# Patient Record
Sex: Female | Born: 1945
Health system: Southern US, Community
[De-identification: ages and names within clinical notes are randomized; demographics above are authoritative.]

## PROBLEM LIST (undated history)

## (undated) DIAGNOSIS — K5900 Constipation, unspecified: Secondary | ICD-10-CM

## (undated) DIAGNOSIS — T7840XA Allergy, unspecified, initial encounter: Secondary | ICD-10-CM

## (undated) DIAGNOSIS — G4700 Insomnia, unspecified: Secondary | ICD-10-CM

## (undated) DIAGNOSIS — G4730 Sleep apnea, unspecified: Secondary | ICD-10-CM

## (undated) DIAGNOSIS — M1990 Unspecified osteoarthritis, unspecified site: Secondary | ICD-10-CM

## (undated) DIAGNOSIS — Z8739 Personal history of other diseases of the musculoskeletal system and connective tissue: Secondary | ICD-10-CM

## (undated) DIAGNOSIS — M810 Age-related osteoporosis without current pathological fracture: Secondary | ICD-10-CM

## (undated) DIAGNOSIS — D649 Anemia, unspecified: Secondary | ICD-10-CM

## (undated) DIAGNOSIS — F419 Anxiety disorder, unspecified: Secondary | ICD-10-CM

## (undated) DIAGNOSIS — K219 Gastro-esophageal reflux disease without esophagitis: Secondary | ICD-10-CM

## (undated) DIAGNOSIS — E039 Hypothyroidism, unspecified: Secondary | ICD-10-CM

## (undated) DIAGNOSIS — R569 Unspecified convulsions: Secondary | ICD-10-CM

## (undated) DIAGNOSIS — M545 Low back pain: Secondary | ICD-10-CM

## (undated) DIAGNOSIS — F329 Major depressive disorder, single episode, unspecified: Secondary | ICD-10-CM

## (undated) DIAGNOSIS — J189 Pneumonia, unspecified organism: Secondary | ICD-10-CM

## (undated) DIAGNOSIS — K5790 Diverticulosis of intestine, part unspecified, without perforation or abscess without bleeding: Secondary | ICD-10-CM

## (undated) DIAGNOSIS — E785 Hyperlipidemia, unspecified: Secondary | ICD-10-CM

## (undated) DIAGNOSIS — R519 Headache, unspecified: Secondary | ICD-10-CM

## (undated) DIAGNOSIS — C801 Malignant (primary) neoplasm, unspecified: Secondary | ICD-10-CM

## (undated) DIAGNOSIS — R200 Anesthesia of skin: Secondary | ICD-10-CM

## (undated) DIAGNOSIS — Z8669 Personal history of other diseases of the nervous system and sense organs: Secondary | ICD-10-CM

## (undated) DIAGNOSIS — I10 Essential (primary) hypertension: Secondary | ICD-10-CM

## (undated) DIAGNOSIS — H4450 Unspecified degenerated conditions of globe: Secondary | ICD-10-CM

## (undated) DIAGNOSIS — R011 Cardiac murmur, unspecified: Secondary | ICD-10-CM

## (undated) HISTORY — PX: OTHER SURGICAL HISTORY: SHX169

## (undated) HISTORY — DX: Essential (primary) hypertension: I10

## (undated) HISTORY — DX: Low back pain: M54.5

## (undated) HISTORY — DX: Unspecified degenerated conditions of globe: H44.50

## (undated) HISTORY — DX: Major depressive disorder, single episode, unspecified: F32.9

## (undated) HISTORY — PX: ADENOIDECTOMY: SUR15

## (undated) HISTORY — DX: Gastro-esophageal reflux disease without esophagitis: K21.9

## (undated) HISTORY — DX: Allergy, unspecified, initial encounter: T78.40XA

## (undated) HISTORY — DX: Sleep apnea, unspecified: G47.30

## (undated) HISTORY — PX: BACK SURGERY: SHX140

## (undated) HISTORY — PX: ESOPHAGOGASTRODUODENOSCOPY: SHX1529

## (undated) HISTORY — DX: Pneumonia, unspecified organism: J18.9

## (undated) HISTORY — DX: Anxiety disorder, unspecified: F41.9

## (undated) HISTORY — PX: UPPER GASTROINTESTINAL ENDOSCOPY: SHX188

## (undated) HISTORY — DX: Age-related osteoporosis without current pathological fracture: M81.0

## (undated) HISTORY — PX: TONSILLECTOMY: SUR1361

---

## 1997-10-30 ENCOUNTER — Ambulatory Visit (HOSPITAL_COMMUNITY): Admission: RE | Admit: 1997-10-30 | Discharge: 1997-10-30 | Payer: Self-pay | Admitting: Gynecology

## 1998-04-22 ENCOUNTER — Emergency Department (HOSPITAL_COMMUNITY): Admission: EM | Admit: 1998-04-22 | Discharge: 1998-04-22 | Payer: Self-pay

## 1998-12-05 ENCOUNTER — Encounter: Payer: Self-pay | Admitting: Gynecology

## 1998-12-05 ENCOUNTER — Ambulatory Visit (HOSPITAL_COMMUNITY): Admission: RE | Admit: 1998-12-05 | Discharge: 1998-12-05 | Payer: Self-pay | Admitting: Gynecology

## 1999-05-14 ENCOUNTER — Other Ambulatory Visit: Admission: RE | Admit: 1999-05-14 | Discharge: 1999-05-14 | Payer: Self-pay | Admitting: Gynecology

## 1999-10-09 ENCOUNTER — Other Ambulatory Visit: Admission: RE | Admit: 1999-10-09 | Discharge: 1999-10-09 | Payer: Self-pay | Admitting: Gynecology

## 1999-10-09 ENCOUNTER — Encounter (INDEPENDENT_AMBULATORY_CARE_PROVIDER_SITE_OTHER): Payer: Self-pay

## 2000-02-25 ENCOUNTER — Encounter: Payer: Self-pay | Admitting: Gynecology

## 2000-02-25 ENCOUNTER — Ambulatory Visit (HOSPITAL_COMMUNITY): Admission: RE | Admit: 2000-02-25 | Discharge: 2000-02-25 | Payer: Self-pay | Admitting: Gynecology

## 2000-03-02 ENCOUNTER — Encounter: Admission: RE | Admit: 2000-03-02 | Discharge: 2000-03-02 | Payer: Self-pay | Admitting: Gynecology

## 2000-03-02 ENCOUNTER — Encounter: Payer: Self-pay | Admitting: Gynecology

## 2000-06-29 ENCOUNTER — Other Ambulatory Visit: Admission: RE | Admit: 2000-06-29 | Discharge: 2000-06-29 | Payer: Self-pay | Admitting: Gynecology

## 2000-09-17 ENCOUNTER — Encounter: Payer: Self-pay | Admitting: Internal Medicine

## 2000-09-17 ENCOUNTER — Ambulatory Visit (HOSPITAL_COMMUNITY): Admission: RE | Admit: 2000-09-17 | Discharge: 2000-09-17 | Payer: Self-pay | Admitting: Internal Medicine

## 2001-03-10 ENCOUNTER — Encounter: Payer: Self-pay | Admitting: Gynecology

## 2001-03-10 ENCOUNTER — Ambulatory Visit (HOSPITAL_COMMUNITY): Admission: RE | Admit: 2001-03-10 | Discharge: 2001-03-10 | Payer: Self-pay | Admitting: Gynecology

## 2001-07-05 ENCOUNTER — Other Ambulatory Visit: Admission: RE | Admit: 2001-07-05 | Discharge: 2001-07-05 | Payer: Self-pay | Admitting: Gynecology

## 2001-07-26 ENCOUNTER — Encounter
Admission: RE | Admit: 2001-07-26 | Discharge: 2001-07-26 | Payer: Self-pay | Admitting: Physical Medicine and Rehabilitation

## 2001-07-26 ENCOUNTER — Encounter: Payer: Self-pay | Admitting: Physical Medicine and Rehabilitation

## 2001-08-19 ENCOUNTER — Encounter: Payer: Self-pay | Admitting: Specialist

## 2001-08-27 ENCOUNTER — Encounter (INDEPENDENT_AMBULATORY_CARE_PROVIDER_SITE_OTHER): Payer: Self-pay | Admitting: Specialist

## 2001-08-27 ENCOUNTER — Inpatient Hospital Stay (HOSPITAL_COMMUNITY): Admission: RE | Admit: 2001-08-27 | Discharge: 2001-08-29 | Payer: Self-pay | Admitting: Specialist

## 2001-08-27 ENCOUNTER — Encounter: Payer: Self-pay | Admitting: Specialist

## 2002-04-08 ENCOUNTER — Ambulatory Visit (HOSPITAL_COMMUNITY): Admission: RE | Admit: 2002-04-08 | Discharge: 2002-04-08 | Payer: Self-pay | Admitting: Gynecology

## 2002-04-08 ENCOUNTER — Encounter: Payer: Self-pay | Admitting: Gynecology

## 2002-07-25 ENCOUNTER — Other Ambulatory Visit: Admission: RE | Admit: 2002-07-25 | Discharge: 2002-07-25 | Payer: Self-pay | Admitting: Gynecology

## 2003-07-26 ENCOUNTER — Other Ambulatory Visit: Admission: RE | Admit: 2003-07-26 | Discharge: 2003-07-26 | Payer: Self-pay | Admitting: Gynecology

## 2003-10-30 ENCOUNTER — Emergency Department (HOSPITAL_COMMUNITY): Admission: EM | Admit: 2003-10-30 | Discharge: 2003-10-30 | Payer: Self-pay | Admitting: Family Medicine

## 2004-03-19 ENCOUNTER — Ambulatory Visit: Payer: Self-pay | Admitting: Internal Medicine

## 2004-03-21 ENCOUNTER — Ambulatory Visit (HOSPITAL_COMMUNITY): Admission: RE | Admit: 2004-03-21 | Discharge: 2004-03-21 | Payer: Self-pay | Admitting: Internal Medicine

## 2004-04-04 ENCOUNTER — Ambulatory Visit: Payer: Self-pay | Admitting: Internal Medicine

## 2004-07-04 ENCOUNTER — Ambulatory Visit (HOSPITAL_COMMUNITY): Admission: RE | Admit: 2004-07-04 | Discharge: 2004-07-04 | Payer: Self-pay | Admitting: Gynecology

## 2004-08-21 ENCOUNTER — Other Ambulatory Visit: Admission: RE | Admit: 2004-08-21 | Discharge: 2004-08-21 | Payer: Self-pay | Admitting: Gynecology

## 2004-10-24 ENCOUNTER — Ambulatory Visit: Payer: Self-pay | Admitting: Internal Medicine

## 2004-10-25 ENCOUNTER — Ambulatory Visit (HOSPITAL_COMMUNITY): Admission: RE | Admit: 2004-10-25 | Discharge: 2004-10-25 | Payer: Self-pay | Admitting: Internal Medicine

## 2004-11-12 ENCOUNTER — Ambulatory Visit: Payer: Self-pay | Admitting: Internal Medicine

## 2004-12-30 ENCOUNTER — Ambulatory Visit: Payer: Self-pay | Admitting: Internal Medicine

## 2005-01-02 ENCOUNTER — Encounter: Admission: RE | Admit: 2005-01-02 | Discharge: 2005-04-02 | Payer: Self-pay | Admitting: Neurology

## 2005-06-16 ENCOUNTER — Ambulatory Visit: Payer: Self-pay | Admitting: Internal Medicine

## 2005-08-27 ENCOUNTER — Ambulatory Visit: Payer: Self-pay | Admitting: Internal Medicine

## 2005-10-01 ENCOUNTER — Ambulatory Visit (HOSPITAL_COMMUNITY): Admission: RE | Admit: 2005-10-01 | Discharge: 2005-10-01 | Payer: Self-pay | Admitting: Gynecology

## 2005-10-16 ENCOUNTER — Other Ambulatory Visit: Admission: RE | Admit: 2005-10-16 | Discharge: 2005-10-16 | Payer: Self-pay | Admitting: Gynecology

## 2005-11-20 ENCOUNTER — Ambulatory Visit (HOSPITAL_COMMUNITY): Admission: RE | Admit: 2005-11-20 | Discharge: 2005-11-20 | Payer: Self-pay | Admitting: Internal Medicine

## 2005-11-27 ENCOUNTER — Ambulatory Visit: Payer: Self-pay | Admitting: Internal Medicine

## 2006-01-29 ENCOUNTER — Encounter: Admission: RE | Admit: 2006-01-29 | Discharge: 2006-01-29 | Payer: Self-pay | Admitting: Orthopaedic Surgery

## 2006-03-23 ENCOUNTER — Encounter: Admission: RE | Admit: 2006-03-23 | Discharge: 2006-03-23 | Payer: Self-pay | Admitting: Orthopaedic Surgery

## 2006-04-27 ENCOUNTER — Inpatient Hospital Stay (HOSPITAL_COMMUNITY): Admission: RE | Admit: 2006-04-27 | Discharge: 2006-04-29 | Payer: Self-pay | Admitting: Orthopaedic Surgery

## 2006-06-05 ENCOUNTER — Ambulatory Visit: Payer: Self-pay | Admitting: Internal Medicine

## 2006-06-06 ENCOUNTER — Emergency Department (HOSPITAL_COMMUNITY): Admission: EM | Admit: 2006-06-06 | Discharge: 2006-06-06 | Payer: Self-pay | Admitting: Family Medicine

## 2006-10-21 ENCOUNTER — Other Ambulatory Visit: Admission: RE | Admit: 2006-10-21 | Discharge: 2006-10-21 | Payer: Self-pay | Admitting: Gynecology

## 2007-04-22 HISTORY — PX: COLONOSCOPY: SHX174

## 2007-05-27 ENCOUNTER — Ambulatory Visit: Payer: Self-pay | Admitting: Internal Medicine

## 2007-05-27 DIAGNOSIS — K219 Gastro-esophageal reflux disease without esophagitis: Secondary | ICD-10-CM | POA: Insufficient documentation

## 2007-05-27 DIAGNOSIS — J309 Allergic rhinitis, unspecified: Secondary | ICD-10-CM | POA: Insufficient documentation

## 2007-05-27 DIAGNOSIS — M545 Low back pain, unspecified: Secondary | ICD-10-CM

## 2007-05-27 DIAGNOSIS — E039 Hypothyroidism, unspecified: Secondary | ICD-10-CM | POA: Insufficient documentation

## 2007-05-27 HISTORY — DX: Low back pain, unspecified: M54.50

## 2007-05-27 HISTORY — DX: Gastro-esophageal reflux disease without esophagitis: K21.9

## 2007-06-03 ENCOUNTER — Ambulatory Visit (HOSPITAL_COMMUNITY): Admission: RE | Admit: 2007-06-03 | Discharge: 2007-06-03 | Payer: Self-pay | Admitting: Gynecology

## 2007-06-24 ENCOUNTER — Ambulatory Visit: Payer: Self-pay | Admitting: Internal Medicine

## 2007-07-09 ENCOUNTER — Encounter: Payer: Self-pay | Admitting: Internal Medicine

## 2007-07-09 ENCOUNTER — Ambulatory Visit: Payer: Self-pay | Admitting: Internal Medicine

## 2007-09-16 ENCOUNTER — Encounter: Payer: Self-pay | Admitting: Internal Medicine

## 2007-11-01 ENCOUNTER — Ambulatory Visit: Payer: Self-pay | Admitting: Internal Medicine

## 2007-11-01 DIAGNOSIS — J209 Acute bronchitis, unspecified: Secondary | ICD-10-CM | POA: Insufficient documentation

## 2007-11-01 LAB — CONVERTED CEMR LAB: TSH: 2.59 microintl units/mL (ref 0.35–5.50)

## 2008-01-07 ENCOUNTER — Ambulatory Visit: Payer: Self-pay | Admitting: Internal Medicine

## 2008-01-13 ENCOUNTER — Ambulatory Visit: Payer: Self-pay | Admitting: Internal Medicine

## 2008-01-13 LAB — CONVERTED CEMR LAB
ALT: 24 units/L (ref 0–35)
AST: 24 units/L (ref 0–37)
Albumin: 3.7 g/dL (ref 3.5–5.2)
Alkaline Phosphatase: 89 units/L (ref 39–117)
BUN: 13 mg/dL (ref 6–23)
Basophils Absolute: 0.1 10*3/uL (ref 0.0–0.1)
Basophils Relative: 0.7 % (ref 0.0–3.0)
Bilirubin Urine: NEGATIVE
Bilirubin, Direct: 0.1 mg/dL (ref 0.0–0.3)
CO2: 35 meq/L — ABNORMAL HIGH (ref 19–32)
Calcium: 9.2 mg/dL (ref 8.4–10.5)
Chloride: 96 meq/L (ref 96–112)
Cholesterol: 204 mg/dL (ref 0–200)
Creatinine, Ser: 0.7 mg/dL (ref 0.4–1.2)
Direct LDL: 129.8 mg/dL
Eosinophils Absolute: 0 10*3/uL (ref 0.0–0.7)
Eosinophils Relative: 0.2 % (ref 0.0–5.0)
GFR calc Af Amer: 110 mL/min
GFR calc non Af Amer: 91 mL/min
Glucose, Bld: 89 mg/dL (ref 70–99)
HCT: 42.2 % (ref 36.0–46.0)
HDL: 50.8 mg/dL (ref >39.0)
Hemoglobin, Urine: NEGATIVE
Hemoglobin: 14.6 g/dL (ref 12.0–15.0)
Ketones, ur: NEGATIVE mg/dL
Leukocytes, UA: NEGATIVE
Lymphocytes Relative: 38.3 % (ref 12.0–46.0)
MCHC: 34.6 g/dL (ref 30.0–36.0)
MCV: 96.2 fL (ref 78.0–100.0)
Monocytes Absolute: 0.6 10*3/uL (ref 0.1–1.0)
Monocytes Relative: 6.9 % (ref 3.0–12.0)
Neutro Abs: 5 10*3/uL (ref 1.4–7.7)
Neutrophils Relative %: 53.9 % (ref 43.0–77.0)
Nitrite: NEGATIVE
Platelets: 258 10*3/uL (ref 150–400)
Potassium: 4.6 meq/L (ref 3.5–5.1)
RBC: 4.39 M/uL (ref 3.87–5.11)
RDW: 11.5 % (ref 11.5–14.6)
Sodium: 136 meq/L (ref 135–145)
Specific Gravity, Urine: 1.01 (ref 1.000–1.03)
TSH: 2.64 microintl units/mL (ref 0.35–5.50)
Total Bilirubin: 0.6 mg/dL (ref 0.3–1.2)
Total CHOL/HDL Ratio: 4
Total Protein, Urine: NEGATIVE mg/dL
Total Protein: 7.5 g/dL (ref 6.0–8.3)
Triglycerides: 178 mg/dL — ABNORMAL HIGH (ref 0–149)
Urine Glucose: NEGATIVE mg/dL
Urobilinogen, UA: 0.2 (ref 0.0–1.0)
VLDL: 36 mg/dL (ref 0–40)
WBC: 9.2 10*3/uL (ref 4.5–10.5)
pH: 8.5 — AB (ref 5.0–8.0)

## 2008-05-22 ENCOUNTER — Ambulatory Visit: Payer: Self-pay | Admitting: Internal Medicine

## 2008-05-24 LAB — CONVERTED CEMR LAB
ALT: 16 units/L (ref 0–35)
AST: 17 units/L (ref 0–37)
Albumin: 3.7 g/dL (ref 3.5–5.2)
Alkaline Phosphatase: 74 units/L (ref 39–117)
BUN: 10 mg/dL (ref 6–23)
Basophils Absolute: 0 10*3/uL (ref 0.0–0.1)
Basophils Relative: 0.6 % (ref 0.0–3.0)
Bilirubin Urine: NEGATIVE
Bilirubin, Direct: 0.1 mg/dL (ref 0.0–0.3)
CO2: 31 meq/L (ref 19–32)
Calcium: 9.1 mg/dL (ref 8.4–10.5)
Chloride: 99 meq/L (ref 96–112)
Creatinine, Ser: 0.7 mg/dL (ref 0.4–1.2)
Crystals: NEGATIVE
Eosinophils Absolute: 0 10*3/uL (ref 0.0–0.7)
Eosinophils Relative: 0 % (ref 0.0–5.0)
GFR calc Af Amer: 110 mL/min
GFR calc non Af Amer: 91 mL/min
Glucose, Bld: 92 mg/dL (ref 70–99)
HCT: 40.8 % (ref 36.0–46.0)
Hemoglobin, Urine: NEGATIVE
Hemoglobin: 14.2 g/dL (ref 12.0–15.0)
Ketones, ur: NEGATIVE mg/dL
Lipase: 27 units/L (ref 11.0–59.0)
Lymphocytes Relative: 34.7 % (ref 12.0–46.0)
MCHC: 34.9 g/dL (ref 30.0–36.0)
MCV: 95.5 fL (ref 78.0–100.0)
Monocytes Absolute: 0.5 10*3/uL (ref 0.1–1.0)
Monocytes Relative: 8.3 % (ref 3.0–12.0)
Mucus, UA: NEGATIVE
Neutro Abs: 3.4 10*3/uL (ref 1.4–7.7)
Neutrophils Relative %: 56.4 % (ref 43.0–77.0)
Nitrite: NEGATIVE
Platelets: 196 10*3/uL (ref 150–400)
Potassium: 4.5 meq/L (ref 3.5–5.1)
RBC: 4.27 M/uL (ref 3.87–5.11)
RDW: 11.1 % — ABNORMAL LOW (ref 11.5–14.6)
Sed Rate: 29 mm/hr — ABNORMAL HIGH (ref 0–22)
Sodium: 137 meq/L (ref 135–145)
Specific Gravity, Urine: 1.02 (ref 1.000–1.03)
Total Bilirubin: 0.6 mg/dL (ref 0.3–1.2)
Total Protein, Urine: NEGATIVE mg/dL
Total Protein: 7.3 g/dL (ref 6.0–8.3)
Urine Glucose: NEGATIVE mg/dL
Urobilinogen, UA: 0.2 (ref 0.0–1.0)
WBC: 6 10*3/uL (ref 4.5–10.5)
pH: 5.5 (ref 5.0–8.0)

## 2008-07-06 ENCOUNTER — Ambulatory Visit (HOSPITAL_COMMUNITY): Admission: RE | Admit: 2008-07-06 | Discharge: 2008-07-06 | Payer: Self-pay | Admitting: Gynecology

## 2009-06-22 ENCOUNTER — Ambulatory Visit: Payer: Self-pay | Admitting: Internal Medicine

## 2009-06-22 ENCOUNTER — Encounter: Payer: Self-pay | Admitting: Internal Medicine

## 2009-06-26 ENCOUNTER — Ambulatory Visit: Payer: Self-pay | Admitting: Cardiology

## 2009-06-26 DIAGNOSIS — I10 Essential (primary) hypertension: Secondary | ICD-10-CM

## 2009-06-26 HISTORY — DX: Essential (primary) hypertension: I10

## 2009-06-27 ENCOUNTER — Telehealth (INDEPENDENT_AMBULATORY_CARE_PROVIDER_SITE_OTHER): Payer: Self-pay | Admitting: *Deleted

## 2009-06-28 ENCOUNTER — Encounter (HOSPITAL_COMMUNITY): Admission: RE | Admit: 2009-06-28 | Discharge: 2009-08-21 | Payer: Self-pay | Admitting: Obstetrics and Gynecology

## 2009-06-28 ENCOUNTER — Ambulatory Visit: Payer: Self-pay

## 2009-06-28 ENCOUNTER — Ambulatory Visit: Payer: Self-pay | Admitting: Cardiovascular Disease

## 2009-07-02 ENCOUNTER — Telehealth: Payer: Self-pay | Admitting: Internal Medicine

## 2009-07-03 ENCOUNTER — Ambulatory Visit: Payer: Self-pay | Admitting: Internal Medicine

## 2009-07-03 DIAGNOSIS — I10 Essential (primary) hypertension: Secondary | ICD-10-CM | POA: Insufficient documentation

## 2009-07-11 ENCOUNTER — Ambulatory Visit: Payer: Self-pay | Admitting: Internal Medicine

## 2009-09-24 ENCOUNTER — Ambulatory Visit: Payer: Self-pay | Admitting: Internal Medicine

## 2009-09-24 ENCOUNTER — Encounter (INDEPENDENT_AMBULATORY_CARE_PROVIDER_SITE_OTHER): Payer: Self-pay | Admitting: *Deleted

## 2009-10-11 ENCOUNTER — Telehealth: Payer: Self-pay | Admitting: Internal Medicine

## 2009-12-05 ENCOUNTER — Ambulatory Visit (HOSPITAL_COMMUNITY): Admission: RE | Admit: 2009-12-05 | Discharge: 2009-12-05 | Payer: Self-pay | Admitting: Gynecology

## 2009-12-19 ENCOUNTER — Ambulatory Visit: Payer: Self-pay | Admitting: Internal Medicine

## 2009-12-19 DIAGNOSIS — F32A Depression, unspecified: Secondary | ICD-10-CM

## 2009-12-19 HISTORY — DX: Depression, unspecified: F32.A

## 2009-12-20 ENCOUNTER — Ambulatory Visit: Payer: Self-pay | Admitting: Internal Medicine

## 2010-01-03 ENCOUNTER — Ambulatory Visit: Payer: Self-pay | Admitting: Internal Medicine

## 2010-01-10 ENCOUNTER — Encounter: Payer: Self-pay | Admitting: Internal Medicine

## 2010-02-18 ENCOUNTER — Emergency Department (HOSPITAL_COMMUNITY)
Admission: EM | Admit: 2010-02-18 | Discharge: 2010-02-18 | Payer: Self-pay | Source: Home / Self Care | Admitting: Emergency Medicine

## 2010-02-19 ENCOUNTER — Telehealth: Payer: Self-pay | Admitting: Internal Medicine

## 2010-05-12 ENCOUNTER — Encounter: Payer: Self-pay | Admitting: Internal Medicine

## 2010-05-12 ENCOUNTER — Encounter: Payer: Self-pay | Admitting: Orthopaedic Surgery

## 2010-05-21 NOTE — Progress Notes (Signed)
  Phone Note Refill Request Message from:  Fax from Pharmacy on February 19, 2010 3:08 PM  Refills Requested: Medication #1:  AMLODIPINE BESYLATE 10 MG TABS 1po once daily   Dosage confirmed as above?Dosage Confirmed   Notes: [facility] [address] Initial call taken by: Robin Ewing CMA (AAMA),  February 19, 2010 3:09 PM    Prescriptions: AMLODIPINE BESYLATE 10 MG TABS (AMLODIPINE BESYLATE) 1po once daily  #90 x 3   Entered by:   Scharlene Gloss CMA (AAMA)   Authorized by:   Corwin Levins MD   Signed by:   Scharlene Gloss CMA (AAMA) on 02/19/2010   Method used:   Faxed to ...       [facility] [address] (retail)       [address]       [place], [place]  [address]       Ph: [phone] or [phone]       Fax: [phone]   RxID:   [id number]

## 2010-05-21 NOTE — Progress Notes (Signed)
Summary: Nuclear Pre-Procedure  Phone Note Outgoing Call   Call placed by: Milana Na, EMT-P,  June 27, 2009 3:28 PM Summary of Call: Reviewed information on Myoview Information Sheet (see scanned document for further details).  Spoke with patient     Nuclear Med Background Indications for Stress Test: Evaluation for Ischemia, Abnormal EKG     Symptoms: Chest Pain, Chest Pressure, Diaphoresis, Fatigue    Nuclear Pre-Procedure Cardiac Risk Factors: History of Smoking Height (in): 66  Nuclear Med Study Referring MD:  J.Hochrein

## 2010-05-21 NOTE — Letter (Signed)
Summary: Out of Work  [facility]  [address] [place], [place] [address]   Phone: [phone]  Fax: [phone]    September 24, 2009   Employee:  Beverly Ballard    To Whom It May Concern:   For Medical reasons, please excuse the above named employee from work for the following dates:  Start:   09/24/2009  End:   09/26/2009  If you need additional information, please feel free to contact our office.         Sincerely,    Dr. Oliver Barre

## 2010-05-21 NOTE — Assessment & Plan Note (Signed)
Summary: NAUSEA/ JUST GOT BACK FROM [place]   Vital Signs:  Patient profile:   65 year old female Height:      66 inches Weight:      190 pounds BMI:     30.78 O2 Sat:      95 % on Room air Temp:     97.4 degrees F oral Pulse rate:   66 / minute BP sitting:   142 / 78  (left arm) Cuff size:   regular  Vitals Entered ByZella Ball Ewing (September 24, 2009 4:31 PM)  O2 Flow:  Room air CC: Nausea, vomiting, diarrhea/RE   Primary Care Provider:  Corwin Levins MD  CC:  Nausea, vomiting, and diarrhea/RE.  History of Present Illness: here for acute visit  - just back from [place] 3 days ago, remembered she had some food at [place] prior to boarding the plane that seemed ok, but later after landing in the [place] at home began with persisitent n/v, ? low grade temp, crampy abd pain and recurrent loose stools and diarrhea, without chills, rash, joint pains, myaligas, headache or blood.  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  Pt denies CP, sob, doe, wheezing, orthopnea, pnd, worsening LE edema, palps, dizziness or syncope   Pt denies new neuro symptoms such as headache, facial or extremity weakness     Problems Prior to Update: 1)  Infectious Diarrhea  (ICD-009.2) 2)  Hypertension  (ICD-401.9) 3)  Essential Hypertension, Benign  (ICD-401.1) 4)  Abnormal Electrocardiogram  (ICD-794.31) 5)  Chest Pain  (ICD-786.50) 6)  Preventive Health Care  (ICD-V70.0) 7)  Weight Gain  (ICD-783.1) 8)  Asthmatic Bronchitis, Acute  (ICD-466.0) 9)  Gerd  (ICD-530.81) 10)  Seizure Disorder  (ICD-780.39) 11)  Low Back Pain  (ICD-724.2) 12)  Hypothyroidism  (ICD-244.9) 13)  Health Screening  (ICD-V70.0) 14)  Allergic Rhinitis  (ICD-477.9)  Medications Prior to Update: 1)  Carbamazepine 200 Mg  Tabs (Carbamazepine) .Marland Kitchen.. 1 By Mouth Two Times A Day 2)  Synthroid 175 Mcg Tabs  (Levothyroxine Sodium) .... Take 1 Tablet By Mouth Once A Day 3)  Sertraline Hcl 50 Mg Tabs (Sertraline Hcl) .Marland Kitchen.. 1po Once Daily 4)  Cetirizine Hcl 10 Mg  Tabs (Cetirizine Hcl) .Marland Kitchen.. 1 By Mouth Once Daily Prn 5)  Multivitamins   Tabs (Multiple Vitamin) .... Take 1 Tablet By Mouth Once A Day 6)  Vitamin B-12 1000 Mcg Tabs (Cyanocobalamin) .... Take 1 Tablet By Mouth Once A Day 7)  Calcium-D 600-200 Mg-Unit Caps (Calcium Carbonate-Vitamin D) .... Take 2 Tablet By Mouth Once A Day 8)  Prevacid 15 Mg Cpdr (Lansoprazole) .... Take 1 Tablet By Mouth Once A Day 9)  Aspirin 81 Mg  Tabs (Aspirin) .Marland Kitchen.. 1 By Mouth Daily 10)  Fish Oil   Oil (Fish Oil) .Marland Kitchen.. 1 By Mouth Daily 11)  Amlodipine Besylate 5 Mg Tabs (Amlodipine Besylate) .Marland Kitchen.. 1 By Mouth Once Daily 12)  Meloxicam 15 Mg Tabs (Meloxicam) .Marland Kitchen.. 1 By Mouth Once Daily As Needed Pain 13)  Tramadol Hcl 50 Mg Tabs (Tramadol Hcl) .Marland Kitchen.. 1 By Mouth Q 6 Hrs As Needed More Severe Pain  Current Medications (verified): 1)  Carbamazepine 200 Mg  Tabs (Carbamazepine) .Marland Kitchen.. 1 By Mouth Two Times A Day 2)  Synthroid 175 Mcg Tabs (Levothyroxine Sodium) .... Take 1 Tablet By Mouth Once A  Day 3)  Sertraline Hcl 50 Mg Tabs (Sertraline Hcl) .Marland Kitchen.. 1po Once Daily 4)  Cetirizine Hcl 10 Mg  Tabs (Cetirizine Hcl) .Marland Kitchen.. 1 By Mouth Once Daily Prn 5)  Multivitamins   Tabs (Multiple Vitamin) .... Take 1 Tablet By Mouth Once A Day 6)  Vitamin B-12 1000 Mcg Tabs (Cyanocobalamin) .... Take 1 Tablet By Mouth Once A Day 7)  Calcium-D 600-200 Mg-Unit Caps (Calcium Carbonate-Vitamin D) .... Take 2 Tablet By Mouth Once A Day 8)  Prevacid 15 Mg Cpdr (Lansoprazole) .... Take 1 Tablet By Mouth Once A Day 9)  Aspirin 81 Mg  Tabs (Aspirin) .Marland Kitchen.. 1 By Mouth Daily 10)  Fish Oil   Oil (Fish Oil) .Marland Kitchen.. 1 By Mouth Daily 11)  Amlodipine Besylate 5 Mg Tabs (Amlodipine Besylate) .Marland Kitchen.. 1 By Mouth Once Daily 12)  Meloxicam 15 Mg Tabs (Meloxicam) .Marland Kitchen.. 1 By Mouth Once Daily As Needed Pain 13)  Tramadol Hcl 50 Mg  Tabs (Tramadol Hcl) .Marland Kitchen.. 1 By Mouth Q 6 Hrs As Needed More Severe Pain 14)  Promethazine Hcl 25 Mg Tabs (Promethazine Hcl) .Marland Kitchen.. 1po Q 6 Hrs As Needed Nausea 15)  Lomotil 2.5-0.025 Mg Tabs (Diphenoxylate-Atropine) .Marland Kitchen.. 1 By Mouth As Needed Loose Stool - Max 8 Tabs Per 24 Hrs 16)  Ciprofloxacin Hcl 500 Mg Tabs (Ciprofloxacin Hcl) .Marland Kitchen.. 1po Two Times A Day  Allergies (verified): 1)  ! Biaxin 2)  ! Doxycycline  Past History:  Past Medical History: Last updated: 07/03/2009 Allergic rhinitis Hypothyroidism - sees  Dr. Juleen China Low back pain Seizure disorder GERD Hypertension  Past Surgical History: Last updated: 06/26/2009 Lumbar fusion Heel spurs  Social History: Last updated: 06/26/2009 Alcohol use-no Work - Engineering geologist Married Drug use-no Regular exercise-no Tobacco  15 years less than 1ppd quit 26 years ago  Risk Factors: Alcohol Use: 0 (06/22/2009) Exercise: no (06/22/2009)  Risk Factors: Smoking Status: quit > 6 months (06/22/2009)  Social History: Reviewed history from 06/26/2009 and no changes required. Alcohol use-no Work - retail Married Drug use-no Regular exercise-no Tobacco  15 years less than 1ppd quit 26 years ago  Review of Systems       all otherwise negative per pt -    Physical Exam  General:  alert and overweight-appearing.  , mild ilol appearing Head:  normocephalic and atraumatic.   Eyes:  vision grossly intact, pupils equal, and pupils round.   Ears:  R ear normal and L ear normal.   Nose:  no external deformity and no nasal discharge.   Mouth:  pharyngeal erythema and fair dentition.   Neck:  supple and no masses.   Lungs:  normal respiratory effort and normal breath sounds.   Heart:  normal rate and regular rhythm.   Abdomen:  soft, normal bowel sounds, no distention, no masses, no guarding, and no rigidity.  but with diffuse mild tender Extremities:  no edema, no erythema  Skin:  color normal and no rashes.     Impression &  Recommendations:  Problem # 1:  INFECTIOUS DIARRHEA (ICD-009.2)  presumed - traveler's vs viral  most likely - ok for phenergan IM today, phenergan by mouth as needed, lomotil as needed and cipro trial;  also for work note , f/u any worsening symptoms  Her updated medication list for this problem includes:    Lomotil 2.5-0.025 Mg Tabs (Diphenoxylate-atropine) .Marland Kitchen... 1 by mouth as needed loose stool - max 8 tabs per 24 hrs  Orders: Promethazine up to 50mg  (J2550) Admin of Therapeutic Inj  intramuscular or subcutaneous (04540)  Problem # 2:  HYPERTENSION (ICD-401.9)  Her updated medication list for this problem includes:    Amlodipine Besylate 5 Mg Tabs (Amlodipine besylate) .Marland Kitchen... 1 by mouth once daily  BP today: 142/78 Prior BP: 178/102 (07/03/2009)  Labs Reviewed: K+: 4.5 (05/22/2008) Creat: : 0.7 (05/22/2008)   Chol: 204 (01/13/2008)   HDL: 50.8 (01/13/2008)   LDL: DEL (01/13/2008)   TG: 178 (01/13/2008) mild elev today, likely situational, ok to follow, continue same treatment   Complete Medication List: 1)  Carbamazepine 200 Mg Tabs (Carbamazepine) .Marland Kitchen.. 1 by mouth two times a day 2)  Synthroid 175 Mcg Tabs (Levothyroxine sodium) .... Take 1 tablet by mouth once a day 3)  Sertraline Hcl 50 Mg Tabs (Sertraline hcl) .Marland Kitchen.. 1po once daily 4)  Cetirizine Hcl 10 Mg Tabs (Cetirizine hcl) .Marland Kitchen.. 1 by mouth once daily prn 5)  Multivitamins Tabs (Multiple vitamin) .... Take 1 tablet by mouth once a day 6)  Vitamin B-12 1000 Mcg Tabs (Cyanocobalamin) .... Take 1 tablet by mouth once a day 7)  Calcium-d 600-200 Mg-unit Caps (Calcium carbonate-vitamin d) .... Take 2 tablet by mouth once a day 8)  Prevacid 15 Mg Cpdr (Lansoprazole) .... Take 1 tablet by mouth once a day 9)  Aspirin 81 Mg Tabs (Aspirin) .Marland Kitchen.. 1 by mouth daily 10)  Fish Oil Oil (Fish oil) .Marland Kitchen.. 1 by mouth daily 11)  Amlodipine Besylate 5 Mg Tabs (Amlodipine besylate) .Marland Kitchen.. 1 by mouth once daily 12)  Meloxicam 15 Mg Tabs (Meloxicam)  .Marland Kitchen.. 1 by mouth once daily as needed pain 13)  Tramadol Hcl 50 Mg Tabs (Tramadol hcl) .Marland Kitchen.. 1 by mouth q 6 hrs as needed more severe pain 14)  Promethazine Hcl 25 Mg Tabs (Promethazine hcl) .Marland Kitchen.. 1po q 6 hrs as needed nausea 15)  Lomotil 2.5-0.025 Mg Tabs (Diphenoxylate-atropine) .Marland Kitchen.. 1 by mouth as needed loose stool - max 8 tabs per 24 hrs 16)  Ciprofloxacin Hcl 500 Mg Tabs (Ciprofloxacin hcl) .Marland Kitchen.. 1po two times a day  Patient Instructions: 1)  you had the phenergan shot today 2)  Please take all new medications as prescribed  3)  Continue all previous medications as before this visit  4)  you are given the work note today 5)  Please schedule a follow-up appointment in 1 month with CPX labs Prescriptions: CIPROFLOXACIN HCL 500 MG TABS (CIPROFLOXACIN HCL) 1po two times a day  #14 x 0   Entered and Authorized by:   Corwin Levins MD   Signed by:   Corwin Levins MD on 09/24/2009   Method used:   Print then Give to Patient   RxID:   [id number] LOMOTIL 2.5-0.025 MG TABS (DIPHENOXYLATE-ATROPINE) 1 by mouth as needed loose stool - max 8 tabs per 24 hrs  #40 x 1   Entered and Authorized by:   Corwin Levins MD   Signed by:   Corwin Levins MD on 09/24/2009   Method used:   Print then Give to Patient   RxID:   [id number] PROMETHAZINE HCL 25 MG TABS (PROMETHAZINE HCL) 1po q 6 hrs as needed nausea  #40 x 1   Entered and Authorized by:   Corwin Levins MD   Signed by:   Corwin Levins MD on 09/24/2009   Method used:   Print then Give to Patient   RxID:   [id number]    Medication Administration  Injection # 1:    Medication: Promethazine up to 50mg   Diagnosis: INFECTIOUS DIARRHEA (ICD-009.2)    Route: IM    Site: LUOQ gluteus    Exp Date: 02/2011    Lot #: [id number]    Mfr: Baxter    Given by: Zella Ball Ewing (September 24, 2009 5:01 PM)  Orders Added: 1)  Promethazine up to 50mg  [J2550] 2)  Admin of Therapeutic Inj  intramuscular or subcutaneous [96372] 3)  Est. Patient Level IV  [98119]

## 2010-05-21 NOTE — Assessment & Plan Note (Signed)
Summary: chest tightness/pain/lb   Vital Signs:  Patient profile:   65 year old female Height:      66 inches Weight:      198 pounds BMI:     32.07 O2 Sat:      97 % on Room air Temp:     97.2 degrees F oral Pulse rate:   55 / minute BP sitting:   178 / 102  (left arm) Cuff size:   regular  Vitals Entered ByZella Ball Ewing (July 03, 2009 4:13 PM)  O2 Flow:  Room air CC: Chest tightness and pain/RE   Primary Care Provider:  Corwin Levins MD  CC:  Chest tightness and pain/RE.  History of Present Illness: here with recent anterior CP, recent stress test neg, has tenderness to ant chest wall and pain is pleuriitic.  No fever, ST, cough, and Pt denies other CP, sob, doe, wheezing, orthopnea, pnd, worsening LE edema, palps, dizziness or syncope .  Chart review shows persistent elev BP since I last saw her 2009.  Pt denies new neuro symptoms such as headache, facial or extremity weakness   Problems Prior to Update: 1)  Hypertension  (ICD-401.9) 2)  Essential Hypertension, Benign  (ICD-401.1) 3)  Abnormal Electrocardiogram  (ICD-794.31) 4)  Chest Pain  (ICD-786.50) 5)  Preventive Health Care  (ICD-V70.0) 6)  Weight Gain  (ICD-783.1) 7)  Asthmatic Bronchitis, Acute  (ICD-466.0) 8)  Gerd  (ICD-530.81) 9)  Seizure Disorder  (ICD-780.39) 10)  Low Back Pain  (ICD-724.2) 11)  Hypothyroidism  (ICD-244.9) 12)  Health Screening  (ICD-V70.0) 13)  Allergic Rhinitis  (ICD-477.9)  Medications Prior to Update: 1)  Carbamazepine 200 Mg  Tabs (Carbamazepine) .Marland Kitchen.. 1 By Mouth Two Times A Day 2)  Synthroid 175 Mcg Tabs (Levothyroxine Sodium) .... Take 1 Tablet By Mouth Once A Day 3)  Zoloft 25 Mg Tabs (Sertraline Hcl) .... Take 1 Tablet By Mouth Once A Day 4)  Cetirizine Hcl 10 Mg  Tabs (Cetirizine Hcl) .Marland Kitchen.. 1 By Mouth Once Daily Prn 5)  Multivitamins   Tabs (Multiple Vitamin) .... Take 1 Tablet By Mouth Once A Day 6)  Vitamin B-12 1000 Mcg Tabs (Cyanocobalamin) .... Take 1 Tablet By Mouth Once  A Day 7)  Calcium-D 600-200 Mg-Unit Caps (Calcium Carbonate-Vitamin D) .... Take 2 Tablet By Mouth Once A Day 8)  Prevacid 15 Mg Cpdr (Lansoprazole) .... Take 1 Tablet By Mouth Once A Day 9)  Aspirin 81 Mg  Tabs (Aspirin) .Marland Kitchen.. 1 By Mouth Daily 10)  Fish Oil   Oil (Fish Oil) .Marland Kitchen.. 1 By Mouth Daily  Current Medications (verified): 1)  Carbamazepine 200 Mg  Tabs (Carbamazepine) .Marland Kitchen.. 1 By Mouth Two Times A Day 2)  Synthroid 175 Mcg Tabs (Levothyroxine Sodium) .... Take 1 Tablet By Mouth Once A Day 3)  Sertraline Hcl 50 Mg Tabs (Sertraline Hcl) .Marland Kitchen.. 1po Once Daily 4)  Cetirizine Hcl 10 Mg  Tabs (Cetirizine Hcl) .Marland Kitchen.. 1 By Mouth Once Daily Prn 5)  Multivitamins   Tabs (Multiple Vitamin) .... Take 1 Tablet By Mouth Once A Day 6)  Vitamin B-12 1000 Mcg Tabs (Cyanocobalamin) .... Take 1 Tablet By Mouth Once A Day 7)  Calcium-D 600-200 Mg-Unit Caps (Calcium Carbonate-Vitamin D) .... Take 2 Tablet By Mouth Once A Day 8)  Prevacid 15 Mg Cpdr (Lansoprazole) .... Take 1 Tablet By Mouth Once A Day 9)  Aspirin 81 Mg  Tabs (Aspirin) .Marland Kitchen.. 1 By Mouth Daily 10)  Fish Oil  Oil (Fish Oil) .Marland Kitchen.. 1 By Mouth Daily 11)  Amlodipine Besylate 5 Mg Tabs (Amlodipine Besylate) .Marland Kitchen.. 1 By Mouth Once Daily 12)  Meloxicam 15 Mg Tabs (Meloxicam) .Marland Kitchen.. 1 By Mouth Once Daily As Needed Pain 13)  Tramadol Hcl 50 Mg Tabs (Tramadol Hcl) .Marland Kitchen.. 1 By Mouth Q 6 Hrs As Needed More Severe Pain  Allergies (verified): 1)  ! Biaxin 2)  ! Doxycycline  Past History:  Past Surgical History: Last updated: 06/26/2009 Lumbar fusion Heel spurs  Social History: Last updated: 06/26/2009 Alcohol use-no Work - Engineering geologist Married Drug use-no Regular exercise-no Tobacco  15 years less than 1ppd quit 26 years ago  Risk Factors: Alcohol Use: 0 (06/22/2009) Exercise: no (06/22/2009)  Risk Factors: Smoking Status: quit > 6 months (06/22/2009)  Past Medical History: Allergic rhinitis Hypothyroidism - sees  Dr. Juleen China Low back pain Seizure  disorder GERD Hypertension  Review of Systems       all otherwise negative per pt -    Physical Exam  General:  alert and overweight-appearing.   Head:  normocephalic and atraumatic.   Eyes:  vision grossly intact, pupils equal, and pupils round.   Ears:  R ear normal and L ear normal.   Nose:  no external deformity and no nasal discharge.   Mouth:  no gingival abnormalities and pharynx pink and moist.   Neck:  supple and no masses.   Lungs:  normal respiratory effort and normal breath sounds.   Heart:  normal rate and regular rhythm.   Abdomen:  soft, non-tender, and normal bowel sounds.   Msk:  tender ant chest wall which she states reproduces her pain Extremities:  no edema, no erythema    Impression & Recommendations:  Problem # 1:  CHEST PAIN (ICD-786.50)  c/w costochondritis - for nsaid +/- tramadol as needed ,  and reassured; no further eval or tx needed  Problem # 2:  HYPERTENSION (ICD-401.9)  "new" onset today but has asctually been normal last when I saw her last 2009;  to start amlod 5 once daily ; f/u next visit and at home  Her updated medication list for this problem includes:    Amlodipine Besylate 5 Mg Tabs (Amlodipine besylate) .Marland Kitchen... 1 by mouth once daily  Complete Medication List: 1)  Carbamazepine 200 Mg Tabs (Carbamazepine) .Marland Kitchen.. 1 by mouth two times a day 2)  Synthroid 175 Mcg Tabs (Levothyroxine sodium) .... Take 1 tablet by mouth once a day 3)  Sertraline Hcl 50 Mg Tabs (Sertraline hcl) .Marland Kitchen.. 1po once daily 4)  Cetirizine Hcl 10 Mg Tabs (Cetirizine hcl) .Marland Kitchen.. 1 by mouth once daily prn 5)  Multivitamins Tabs (Multiple vitamin) .... Take 1 tablet by mouth once a day 6)  Vitamin B-12 1000 Mcg Tabs (Cyanocobalamin) .... Take 1 tablet by mouth once a day 7)  Calcium-d 600-200 Mg-unit Caps (Calcium carbonate-vitamin d) .... Take 2 tablet by mouth once a day 8)  Prevacid 15 Mg Cpdr (Lansoprazole) .... Take 1 tablet by mouth once a day 9)  Aspirin 81 Mg Tabs  (Aspirin) .Marland Kitchen.. 1 by mouth daily 10)  Fish Oil Oil (Fish oil) .Marland Kitchen.. 1 by mouth daily 11)  Amlodipine Besylate 5 Mg Tabs (Amlodipine besylate) .Marland Kitchen.. 1 by mouth once daily 12)  Meloxicam 15 Mg Tabs (Meloxicam) .Marland Kitchen.. 1 by mouth once daily as needed pain 13)  Tramadol Hcl 50 Mg Tabs (Tramadol hcl) .Marland Kitchen.. 1 by mouth q 6 hrs as needed more severe pain  Patient Instructions: 1)  Please  take all new medications as prescribed  2)  Continue all previous medications as before this visit  3)  Please schedule a follow-up appointment in 4 months with CPX labs 4)  Check your Blood Pressure regularly. If it is above 140/90: you should make an appointment sooner Prescriptions: TRAMADOL HCL 50 MG TABS (TRAMADOL HCL) 1 by mouth q 6 hrs as needed more severe pain  #60 x 1   Entered and Authorized by:   Corwin Levins MD   Signed by:   Corwin Levins MD on 07/03/2009   Method used:   Print then Give to Patient   RxID:   [id number] MELOXICAM 15 MG TABS (MELOXICAM) 1 by mouth once daily as needed pain  #30 x 5   Entered and Authorized by:   Corwin Levins MD   Signed by:   Corwin Levins MD on 07/03/2009   Method used:   Print then Give to Patient   RxID:   [id number] AMLODIPINE BESYLATE 5 MG TABS (AMLODIPINE BESYLATE) 1 by mouth once daily  #30 x 11   Entered and Authorized by:   Corwin Levins MD   Signed by:   Corwin Levins MD on 07/03/2009   Method used:   Print then Give to Patient   RxID:   [id number]

## 2010-05-21 NOTE — Letter (Signed)
Summary: [facility]   Imported By: Sherian Rein 01/15/2010 14:49:15  _____________________________________________________________________  External Attachment:    Type:   Image     Comment:   External Document

## 2010-05-21 NOTE — Letter (Signed)
Summary: Out of Work  [facility]  [address] [place], [place] [address]   Phone: [phone]  Fax: [phone]    June 22, 2009   Employee:  JANAUTICA NETZLEY    To Whom It May Concern:   For Medical reasons, please excuse the above named employee from work for the following dates:  Start:   06/22/09  End:   07/02/09  If you need additional information, please feel free to contact our office.         Sincerely,    Etta Grandchild MD

## 2010-05-21 NOTE — Assessment & Plan Note (Signed)
Summary: Cardiology Nuclear Study/ pt aware  pfh,rn  Nuclear Med Background Indications for Stress Test: Evaluation for Ischemia, Abnormal EKG   History: GXT  History Comments:  ~15 yrs ONG:EXBMWU per patient. NO DOCUMENTED CAD.  Symptoms: Chest Pain, Chest Pain with Exertion, Chest Pressure, Chest Pressure with Exertion, Diaphoresis, Fatigue  Symptoms Comments: Last episode of XL:[id number], 6/10, while raking; relieved with rest.   Nuclear Pre-Procedure Cardiac Risk Factors: History of Smoking, Obesity Caffeine/Decaff Intake: None NPO After: 7:30 PM Lungs: Clear.  O2 Sat 99% on RA. IV 0.9% NS with Angio Cath: 20g     IV Site: (R) AC IV Started by: Stanton Kidney EMT-P Chest Size (in) 42     Cup Size C     Height (in): 66 Weight (lb): 197 BMI: 31.91  Nuclear Med Study 1 or 2 day study:  1 day     Stress Test Type:  Eugenie Birks Reading MD:  Charlton Haws, MD     Referring MD:  Rollene Rotunda, MD Resting Radionuclide:  Technetium 16m Tetrofosmin     Resting Radionuclide Dose:  11.0 mCi  Stress Radionuclide:  Technetium 33m Tetrofosmin     Stress Radionuclide Dose:  33.0 mCi   Stress Protocol   Lexiscan: 0.4 mg   Stress Test Technologist:  Rea College CMA-N     Nuclear Technologist:  Burna Mortimer Deal RT-N  Rest Procedure  Myocardial perfusion imaging was performed at rest 45 minutes following the intravenous administration of Myoview Technetium 50m Tetrofosmin.  Stress Procedure  The patient initially walked the treadmill utilizing the Bruce protocol, but had to be stopped after 3:21 due to a hypertensive response, 210/66.  The patient then received IV Lexiscan 0.4 mg over 15-seconds.  Myoview injected at 30-seconds.  There were no significant changes with lexiscan.  Quantitative spect images were obtained after a 45 minute delay.  QPS Raw Data Images:  Normal; no motion artifact; normal heart/lung ratio. Stress Images:  NI: Uniform and normal uptake of tracer in all myocardial  segments. Rest Images:  Normal homogeneous uptake in all areas of the myocardium. Subtraction (SDS):  Normal Transient Ischemic Dilatation:  n  (Normal <1.22)  Lung/Heart Ratio:  .30  (Normal <0.45)  Quantitative Gated Spect Images QGS EDV:  83 ml QGS ESV:  21 ml QGS EF:  75 % QGS cine images:  normal  Findings Normal nuclear study      Overall Impression  Exercise Capacity: Lexiscan BP Response: Normal blood pressure response. Clinical Symptoms: No chest pain ECG Impression: No significant ST segment change suggestive of ischemia. Overall Impression: Normal stress nuclear study. Overall Impression Comments: Normal  Appended Document: Cardiology Nuclear Study Negative.  No further cardiac work up.  OK to return to work.  Appended Document: Cardiology Nuclear Study Pt aware of results

## 2010-05-21 NOTE — Assessment & Plan Note (Signed)
Summary: np6/abn ekg/jml  Medications Added CARBAMAZEPINE 200 MG  TABS (CARBAMAZEPINE) 1 by mouth two times a day ASPIRIN 81 MG  TABS (ASPIRIN) 1 by mouth daily FISH OIL   OIL (FISH OIL) 1 by mouth daily      Allergies Added:   Visit Type:  Follow-up Primary Provider:  Corwin Levins MD  CC:  chest pain.  History of Present Illness: The patient presents with chest discomfort. This has been going on for approximately 6 months. She describes a substernal pressing discomfort. It comes and goes sporadically. It is unlike reflux that she has had in the past. It is associated with some numbness and discomfort in her left arm. At its worst it is 8/10 in intensity. She does have some associated diaphoresis with this but no shortness of breath. She doesn't notice palpitations, presyncope or syncope. She has had some decreased exercise tolerance. She is not describing PND or orthopnea. She reports that the discomfort has been getting more frequent and more intense. She is relatively sedentary not exercising though she works. She has not noticed an activity that can bring this on.  Current Medications (verified): 1)  Carbamazepine 200 Mg  Tabs (Carbamazepine) .Marland Kitchen.. 1 By Mouth Two Times A Day 2)  Synthroid 175 Mcg Tabs (Levothyroxine Sodium) .... Take 1 Tablet By Mouth Once A Day 3)  Zoloft 25 Mg Tabs (Sertraline Hcl) .... Take 1 Tablet By Mouth Once A Day 4)  Cetirizine Hcl 10 Mg  Tabs (Cetirizine Hcl) .Marland Kitchen.. 1 By Mouth Once Daily Prn 5)  Multivitamins   Tabs (Multiple Vitamin) .... Take 1 Tablet By Mouth Once A Day 6)  Vitamin B-12 1000 Mcg Tabs (Cyanocobalamin) .... Take 1 Tablet By Mouth Once A Day 7)  Calcium-D 600-200 Mg-Unit Caps (Calcium Carbonate-Vitamin D) .... Take 2 Tablet By Mouth Once A Day 8)  Prevacid 15 Mg Cpdr (Lansoprazole) .... Take 1 Tablet By Mouth Once A Day 9)  Aspirin 81 Mg  Tabs (Aspirin) .Marland Kitchen.. 1 By Mouth Daily 10)  Fish Oil   Oil (Fish Oil) .Marland Kitchen.. 1 By Mouth Daily  Allergies  (verified): 1)  ! Biaxin 2)  ! Doxycycline  Past History:  Past Medical History: Allergic rhinitis Hypothyroidism - sees  Dr. Juleen China Low back pain Seizure disorder GERD  Past Surgical History: Lumbar fusion Heel spurs  Family History: Mother with arthritis Father died from AAA (age 60)  Social History: Alcohol use-no Work - Engineering geologist Married Drug use-no Regular exercise-no Tobacco  15 years less than 1ppd quit 26 years ago  Review of Systems       Occasional reflux, dizziness. Otherwise as stated in the history of present illness negative for all other systems.  Vital Signs:  Patient profile:   65 year old female Height:      66 inches Weight:      199 pounds BMI:     32.24 Pulse rate:   63 / minute Resp:     16 per minute BP sitting:   151 / 77  (left arm)  Vitals Entered By: Marrion Coy, CNA (June 26, 2009 11:26 AM)  Physical Exam  General:  Well developed, well nourished, in no acute distress. Head:  normocephalic and atraumatic Eyes:  PERRLA/EOM intact; conjunctiva and lids normal. Neck:  Neck supple, no JVD. No masses, thyromegaly or abnormal cervical nodes. Chest Wall:  no deformities or breast masses noted Lungs:  Clear bilaterally to auscultation and percussion. Heart:  Non-displaced PMI, chest non-tender; regular  rate and rhythm, S1, S2 without murmurs, rubs or gallops. Carotid upstroke normal, no bruit. Normal abdominal aortic size, no bruits. Femorals normal pulses, no bruits. Pedals normal pulses. No edema, no varicosities. Abdomen:  Bowel sounds positive; abdomen soft and non-tender without masses, organomegaly, or hernias noted. No hepatosplenomegaly. Msk:  Back normal, normal gait. Muscle strength and tone normal. Pulses:  pulses normal in all 4 extremities Extremities:  No clubbing or cyanosis. Neurologic:  Alert and oriented x 3. Skin:  Intact without lesions or rashes. Cervical Nodes:  no significant adenopathy Axillary Nodes:  no  significant adenopathy Inguinal Nodes:  no significant adenopathy Psych:  Normal affect.   EKG  Procedure date:  06/22/2009  Findings:      sinus rhythm, rate 59, poor anterior R-wave progression, axis within normal limits, intervals within normal limits, no acute ST-T wave changes.  Impression & Recommendations:  Problem # 1:  CHEST PAIN (ICD-786.50) The patient has chest discomfort with typical greater than atypical features. She has a slightly abnormal EKG as described. I believe the pretest probability of obstructive coronary disease is moderately high. Therefore, according to ACC/AHA appropriateness guidelines stress testing with perfusion imaging is indicated. Plane all exercise treadmill testing does not have enough sensitivity or specificity in this clinical scenario. Cardiac catheterization is not indicated. I have discussed this with the patient and they agree to proceed.  Orders: Nuclear Stress Test (Nuc Stress Test)  Problem # 2:  WEIGHT GAIN (ICD-783.1) She He is losing weight. I discussed weight loss strategies with an emphasis on exercise.  Problem # 3:  ESSENTIAL HYPERTENSION, BENIGN (ICD-401.1) The patient has a mildly elevated blood pressure. She is instructed on therapeutic lifestyle changes and she should keep a blood pressure diary as she may need treatment going forward.  Patient Instructions: 1)  Your physician recommends that you schedule a follow-up appointment after myoview as needed 2)  Your physician recommends that you continue on your current medications as directed. Please refer to the Current Medication list given to you today. 3)  Your physician has requested that you have an exercise stress myoview.  For further information please visit [email or web address]  Please follow instruction sheet, as given.

## 2010-05-21 NOTE — Progress Notes (Signed)
Summary: Heart issue  Phone Note Call from Patient Call back at Home Phone [phone]   Summary of Call: Pt was seen by cardiology and told that "everything was ok" but she continues to c/o "problems with her heart". Please advise.  Initial call taken by: Lamar Sprinkles, CMA,  July 02, 2009 5:08 PM  Follow-up for Phone Call        pt seen by me last visit aug 2009;  consider OV to consider other cause for chest pain Follow-up by: Corwin Levins MD,  July 02, 2009 5:51 PM  Additional Follow-up for Phone Call Additional follow up Details #1::        pt informed and transferred to schedule appt. Additional Follow-up by: Margaret Pyle, CMA,  July 03, 2009 8:21 AM

## 2010-05-21 NOTE — Progress Notes (Signed)
Summary: Rx refill req  Phone Note Refill Request Message from:  Patient on October 11, 2009 3:23 PM  Refills Requested: Medication #1:  AMLODIPINE BESYLATE 5 MG TABS 1 by mouth once daily   Dosage confirmed as above?Dosage Confirmed   Supply Requested: 3 months Pt is requesting refills to [facility]    Method Requested: Fax to Local Pharmacy Initial call taken by: Margaret Pyle, CMA,  October 11, 2009 3:24 PM    Prescriptions: AMLODIPINE BESYLATE 5 MG TABS (AMLODIPINE BESYLATE) 1 by mouth once daily  #90 x 1   Entered by:   Margaret Pyle, CMA   Authorized by:   Corwin Levins MD   Signed by:   Margaret Pyle, CMA on 10/11/2009   Method used:   Faxed to ...       [facility] Environmental education officer)       [address] [address]       [place], [place]  [address]       Ph: [phone]       Fax: [phone]   RxID:   [id number]

## 2010-05-21 NOTE — Assessment & Plan Note (Signed)
Summary: CONGESTION-LIGHT HEAD -DIZZINESS-SINUS PROBLEM X 2-3 DYS-DR A...   Vital Signs:  Patient profile:   65 year old female Weight:      197 pounds O2 Sat:      97 % on Room air Temp:     98.4 degrees F oral Pulse rate:   62 / minute Pulse rhythm:   regular Resp:     16 per minute BP sitting:   138 / 80  (left arm) Cuff size:   large  Vitals Entered By: Rock Nephew CMA (June 22, 2009 9:49 AM)  O2 Flow:  Room air CC: sinus pressure, congestion, fatigue, and chills x 3days, Abdominal Pain Is Patient Diabetic? No   Primary Care Provider:  Corwin Levins MD  CC:  sinus pressure, congestion, fatigue, and chills x 3days, and Abdominal Pain.  History of Present Illness: New to me she complains of intermittent  CP, SOB, nausea, dizziness, near syncope and fatigue for 4 months. She has no Fam. Hx. of CAD but there is some Fam. Hx. of AAA and irregular heart beats.  Dyspepsia History:      She has no alarm features of dyspepsia including no history of melena, hematochezia, dysphagia, persistent vomiting, or involuntary weight loss > 5%.  There is a prior history of GERD.     Preventive Screening-Counseling & Management  Alcohol-Tobacco     Alcohol drinks/day: 0     Smoking Status: quit > 6 months     Year Started: 1976     Year Quit: 1986     Pack years: 5     Tobacco Counseling: to remain off tobacco products  Caffeine-Diet-Exercise     Does Patient Exercise: no      Drug Use:  no.    Clinical Review Panels:  Lipid Management   Cholesterol:  204 (01/13/2008)   LDL (bad choesterol):  DEL (01/13/2008)   HDL (good cholesterol):  50.8 (01/13/2008)  Diabetes Management   Creatinine:  0.7 (05/22/2008)   Last Flu Vaccine:  Historical (01/19/2009)  CBC   WBC:  6.0 (05/22/2008)   RBC:  4.27 (05/22/2008)   Hgb:  14.2 (05/22/2008)   Hct:  40.8 (05/22/2008)   Platelets:  196 (05/22/2008)   MCV  95.5 (05/22/2008)   MCHC  34.9 (05/22/2008)   RDW  11.1 (05/22/2008)  PMN:  56.4 (05/22/2008)   Lymphs:  34.7 (05/22/2008)   Monos:  8.3 (05/22/2008)   Eosinophils:  0.0 (05/22/2008)   Basophil:  0.6 (05/22/2008)  Complete Metabolic Panel   Glucose:  92 (05/22/2008)   Sodium:  137 (05/22/2008)   Potassium:  4.5 (05/22/2008)   Chloride:  99 (05/22/2008)   CO2:  31 (05/22/2008)   BUN:  10 (05/22/2008)   Creatinine:  0.7 (05/22/2008)   Albumin:  3.7 (05/22/2008)   Total Protein:  7.3 (05/22/2008)   Calcium:  9.1 (05/22/2008)   Total Bili:  0.6 (05/22/2008)   Alk Phos:  74 (05/22/2008)   SGPT (ALT):  16 (05/22/2008)   SGOT (AST):  17 (05/22/2008)   Medications Prior to Update: 1)  Carbamazepine 200 Mg  Tabs (Carbamazepine) .Marland Kitchen.. 1 Po Four Times A Day 2)  Synthroid 150 Mcg Tabs (Levothyroxine Sodium) .... Take 1 Tablet By Mouth Once A Day 3)  Zoloft 25 Mg Tabs (Sertraline Hcl) .... Take 1 Tablet By Mouth Once A Day 4)  Cetirizine Hcl 10 Mg  Tabs (Cetirizine Hcl) .Marland Kitchen.. 1 By Mouth Once Daily Prn 5)  Multivitamins  Tabs (Multiple Vitamin) .... Take 1 Tablet By Mouth Once A Day 6)  Vitamin B-12 1000 Mcg Tabs (Cyanocobalamin) .... Take 1 Tablet By Mouth Once A Day 7)  Calcium-D 600-200 Mg-Unit Caps (Calcium Carbonate-Vitamin D) .... Take 2 Tablet By Mouth Once A Day 8)  Prevacid 15 Mg Cpdr (Lansoprazole) .... Take 1 Tablet By Mouth Once A Day  Current Medications (verified): 1)  Carbamazepine 200 Mg  Tabs (Carbamazepine) .Marland Kitchen.. 1 Po Four Times A Day 2)  Synthroid 175 Mcg Tabs (Levothyroxine Sodium) .... Take 1 Tablet By Mouth Once A Day 3)  Zoloft 25 Mg Tabs (Sertraline Hcl) .... Take 1 Tablet By Mouth Once A Day 4)  Cetirizine Hcl 10 Mg  Tabs (Cetirizine Hcl) .Marland Kitchen.. 1 By Mouth Once Daily Prn 5)  Multivitamins   Tabs (Multiple Vitamin) .... Take 1 Tablet By Mouth Once A Day 6)  Vitamin B-12 1000 Mcg Tabs (Cyanocobalamin) .... Take 1 Tablet By Mouth Once A Day 7)  Calcium-D 600-200 Mg-Unit Caps (Calcium Carbonate-Vitamin D) .... Take 2 Tablet By Mouth Once A  Day 8)  Prevacid 15 Mg Cpdr (Lansoprazole) .... Take 1 Tablet By Mouth Once A Day  Allergies (verified): 1)  ! Biaxin 2)  ! Doxycycline  Past History:  Past Medical History: Reviewed history from 11/01/2007 and no changes required. Allergic rhinitis Hypothyroidism - see dr Juleen China Low back pain Seizure disorder GERD  Past Surgical History: Reviewed history from 05/27/2007 and no changes required. s/p lumbar fusion s/p heel spurs  Family History: Reviewed history from 05/27/2007 and no changes required. mother with arthritis Father died from AAA  Social History: Reviewed history from 05/27/2007 and no changes required. 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  The patient denies fever, weight loss, weight gain, hoarseness, syncope, dyspnea on exertion, peripheral edema, prolonged cough, headaches, hemoptysis, abdominal pain, hematuria, and enlarged lymph nodes.   ENT:  Complains of nasal congestion; denies decreased hearing, difficulty swallowing, ear discharge, earache, nosebleeds, postnasal drainage, ringing in ears, sinus pressure, and sore throat.  Physical Exam  General:  alert, well-developed, well-nourished, well-hydrated, normal appearance, healthy-appearing, cooperative to examination, good hygiene, and overweight-appearing.   Head:  normocephalic, atraumatic, no abnormalities observed, and no abnormalities palpated.   Mouth:  Oral mucosa and oropharynx without lesions or exudates.  Teeth in good repair. Neck:  supple, full ROM, no masses, no thyromegaly, no thyroid nodules or tenderness, no JVD, normal carotid upstroke, no carotid bruits, and no cervical lymphadenopathy.   Lungs:  normal respiratory effort, no intercostal retractions, no accessory muscle use, normal breath sounds, no dullness, no  fremitus, no crackles, and no wheezes.   Heart:  normal rate, regular rhythm, no murmur, no gallop, no rub, and no JVD.   Abdomen:  soft, non-tender, normal bowel sounds, no distention, no masses, no guarding, no rigidity, no hepatomegaly, and no splenomegaly.   Msk:  normal ROM, no joint tenderness, no joint swelling, and no joint warmth.   Pulses:  R and L carotid,radial,femoral,dorsalis pedis and posterior tibial pulses are full and equal bilaterally Extremities:  No clubbing, cyanosis, edema, or deformity noted with normal full range of motion of all joints.   Neurologic:  No cranial nerve deficits noted. Station and gait are normal. Plantar reflexes are down-going bilaterally. DTRs are symmetrical  throughout. Sensory, motor and coordinative functions appear intact. Skin:  turgor normal, color normal, no rashes, no suspicious lesions, no ecchymoses, no petechiae, no purpura, no ulcerations, and no edema.   Cervical Nodes:  no anterior cervical adenopathy and no posterior cervical adenopathy.   Axillary Nodes:  no R axillary adenopathy and no L axillary adenopathy.   Psych:  Oriented X3, memory intact for recent and remote, not anxious appearing, depressed affect, subdued, and tearful.   Additional Exam:  EKG shows sinus bradycardia and poor R waves in V1 and III which is most likely a normal variant.  There is in an inverted T in V1, also a normal variant. There are no Q waves and no abnormalities in the ST/T waves and segments.   Impression & Recommendations:  Problem # 1:  CHEST PAIN (ICD-786.50) Assessment New I am concerned about  angina though the situation is stable with normal EKG and no recent worsening in her symptoms. She has very lillte in the risk factor category for CAD. There is no evidence of AA on the chest xray. If cardiac work-up is negative will look for other causes for CP like GERD, anxiety/depression, ms. Orders: T-2 View CXR (71020TC) Cardiology Referral  (Cardiology) EKG w/ Interpretation (93000)  Problem # 2:  ABNORMAL ELECTROCARDIOGRAM (ICD-794.31) Assessment: New as above Orders: Cardiology Referral (Cardiology) EKG w/ Interpretation (93000)  Problem # 3:  ALLERGIC RHINITIS (ICD-477.9) Assessment: Unchanged  Her updated medication list for this problem includes:    Cetirizine Hcl 10 Mg Tabs (Cetirizine hcl) .Marland Kitchen... 1 by mouth once daily prn  Discussed use of allergy medications and environmental measures.   Complete Medication List: 1)  Carbamazepine 200 Mg Tabs (Carbamazepine) .Marland Kitchen.. 1 po four times a day 2)  Synthroid 175 Mcg Tabs (Levothyroxine sodium) .... Take 1 tablet by mouth once a day 3)  Zoloft 25 Mg Tabs (Sertraline hcl) .... Take 1 tablet by mouth once a day 4)  Cetirizine Hcl 10 Mg Tabs (Cetirizine hcl) .Marland Kitchen.. 1 by mouth once daily prn 5)  Multivitamins Tabs (Multiple vitamin) .... Take 1 tablet by mouth once a day 6)  Vitamin B-12 1000 Mcg Tabs (Cyanocobalamin) .... Take 1 tablet by mouth once a day 7)  Calcium-d 600-200 Mg-unit Caps (Calcium carbonate-vitamin d) .... Take 2 tablet by mouth once a day 8)  Prevacid 15 Mg Cpdr (Lansoprazole) .... Take 1 tablet by mouth once a day  Patient Instructions: 1)  Please schedule a follow-up appointment in 2 weeks. Call for any chest pain or new/worsening symptoms.    Immunization History:  Influenza Immunization History:    Influenza:  historical (01/19/2009)

## 2010-05-21 NOTE — Assessment & Plan Note (Signed)
Summary: HIP AND LEG PAIN/NWS  #   Vital Signs:  Patient profile:   65 year old female Height:      66 inches Weight:      194 pounds BMI:     31.43 O2 Sat:      97 % on Room air Temp:     99 degrees F oral Pulse rate:   67 / minute BP sitting:   142 / 70  (left arm) Cuff size:   regular  Vitals Entered By: Zella Ball Ewing CMA Duncan Dull) (December 19, 2009 9:11 AM)  O2 Flow:  Room air CC: Left leg and hip pain/RE   Primary Care Provider:  Corwin Levins MD  CC:  Left leg and hip pain/RE.  History of Present Illness: diarrhea resolved from last visit, here today with 2 to 3 mo new problem with left leg - seems to start in the left lateral mid and upper thigh/hip area, worse to go up steps to the house, has to lift the leg with the hands to get in the car or sit certain ways in the chair, more trouble at work, grad worse, now with pain  at night that does not go away disturbs the sleep and usually sleeps on the left side nightly;  Pt denies CP, worsening sob, doe, wheezing, orthopnea, pnd, worsening LE edema, palps, dizziness or syncope  Pt denies new neuro symptoms such as headache, facial or extremity weakness   .  Does remember a fall while in [place] jst prior to returning last visit in june;  saw orthopedic with cortisone to the left knee 1 mo ago per Dr Ophelia Charter.  No low back pain  or worsening LE pain/weak/numb other than above, no bowel or bladder changes.    chart review shows borderline to elev BP over the past 18 mo, and she state BP at [facility] similar.  Cant afford  eve n another generic med at this time,  may lose her insurance soon as it has been proposed strongly the she should should consider "voluntary" retirmenet mar 31 next yr.  More stress lately,, as she feels if she does not take it voluntarily, she'll be forced out another way - works 53yrs for [employer].    Problems Prior to Update: 1)  Hip Pain, Left  (ICD-719.45) 2)  Infectious Diarrhea  (ICD-009.2) 3)  Hypertension   (ICD-401.9) 4)  Essential Hypertension, Benign  (ICD-401.1) 5)  Abnormal Electrocardiogram  (ICD-794.31) 6)  Chest Pain  (ICD-786.50) 7)  Preventive Health Care  (ICD-V70.0) 8)  Weight Gain  (ICD-783.1) 9)  Asthmatic Bronchitis, Acute  (ICD-466.0) 10)  Gerd  (ICD-530.81) 11)  Seizure Disorder  (ICD-780.39) 12)  Low Back Pain  (ICD-724.2) 13)  Hypothyroidism  (ICD-244.9) 14)  Health Screening  (ICD-V70.0) 15)  Allergic Rhinitis  (ICD-477.9)  Medications Prior to Update: 1)  Carbamazepine 200 Mg  Tabs (Carbamazepine) .Marland Kitchen.. 1 By Mouth Two Times A Day 2)  Synthroid 175 Mcg Tabs (Levothyroxine Sodium) .... Take 1 Tablet By Mouth Once A Day 3)  Sertraline Hcl 50 Mg Tabs (Sertraline Hcl) .Marland Kitchen.. 1po Once Daily 4)  Cetirizine Hcl 10 Mg  Tabs (Cetirizine Hcl) .Marland Kitchen.. 1 By Mouth Once Daily Prn 5)  Multivitamins   Tabs (Multiple Vitamin) .... Take 1 Tablet By Mouth Once A Day 6)  Vitamin B-12 1000 Mcg Tabs (Cyanocobalamin) .... Take 1 Tablet By Mouth Once A Day 7)  Calcium-D 600-200 Mg-Unit Caps (Calcium Carbonate-Vitamin D) .... Take 2 Tablet By  Mouth Once A Day 8)  Prevacid 15 Mg Cpdr (Lansoprazole) .... Take 1 Tablet By Mouth Once A Day 9)  Aspirin 81 Mg  Tabs (Aspirin) .Marland Kitchen.. 1 By Mouth Daily 10)  Fish Oil   Oil (Fish Oil) .Marland Kitchen.. 1 By Mouth Daily 11)  Amlodipine Besylate 5 Mg Tabs (Amlodipine Besylate) .Marland Kitchen.. 1 By Mouth Once Daily 12)  Meloxicam 15 Mg Tabs (Meloxicam) .Marland Kitchen.. 1 By Mouth Once Daily As Needed Pain 13)  Tramadol Hcl 50 Mg Tabs (Tramadol Hcl) .Marland Kitchen.. 1 By Mouth Q 6 Hrs As Needed More Severe Pain 14)  Promethazine Hcl 25 Mg Tabs (Promethazine Hcl) .Marland Kitchen.. 1po Q 6 Hrs As Needed Nausea 15)  Lomotil 2.5-0.025 Mg Tabs (Diphenoxylate-Atropine) .Marland Kitchen.. 1 By Mouth As Needed Loose Stool - Max 8 Tabs Per 24 Hrs 16)  Ciprofloxacin Hcl 500 Mg Tabs (Ciprofloxacin Hcl) .Marland Kitchen.. 1po Two Times A Day  Current Medications (verified): 1)  Carbamazepine 200 Mg  Tabs (Carbamazepine) .Marland Kitchen.. 1 By Mouth Two Times A Day 2)   Synthroid 175 Mcg Tabs (Levothyroxine Sodium) .... Take 1 Tablet By Mouth Once A Day 3)  Sertraline Hcl 100 Mg Tabs (Sertraline Hcl) .Marland Kitchen.. 1po Once Daily 4)  Cetirizine Hcl 10 Mg  Tabs (Cetirizine Hcl) .Marland Kitchen.. 1 By Mouth Once Daily Prn 5)  Multivitamins   Tabs (Multiple Vitamin) .... Take 1 Tablet By Mouth Once A Day 6)  Vitamin B-12 1000 Mcg Tabs (Cyanocobalamin) .... Take 1 Tablet By Mouth Once A Day 7)  Calcium-D 600-200 Mg-Unit Caps (Calcium Carbonate-Vitamin D) .... Take 2 Tablet By Mouth Once A Day 8)  Prevacid 15 Mg Cpdr (Lansoprazole) .... Take 1 Tablet By Mouth Once A Day 9)  Aspirin 81 Mg  Tabs (Aspirin) .Marland Kitchen.. 1 By Mouth Daily 10)  Fish Oil   Oil (Fish Oil) .Marland Kitchen.. 1 By Mouth Daily 11)  Amlodipine Besylate 10 Mg Tabs (Amlodipine Besylate) .Marland Kitchen.. 1po Once Daily 12)  Meloxicam 15 Mg Tabs (Meloxicam) .Marland Kitchen.. 1 By Mouth Once Daily As Needed Pain 13)  Tramadol Hcl 50 Mg Tabs (Tramadol Hcl) .Marland Kitchen.. 1 By Mouth Q 6 Hrs As Needed More Severe Pain 14)  Promethazine Hcl 25 Mg Tabs (Promethazine Hcl) .Marland Kitchen.. 1po Q 6 Hrs As Needed Nausea 15)  Prednisone 10 Mg Tabs (Prednisone) .... 3po Qd For 3days, Then 2po Qd For 3days, Then 1po Qd For 3days, Then Stop  Allergies (verified): 1)  ! Biaxin 2)  ! Doxycycline  Past History:  Social History: Last updated: 06/26/2009 Alcohol use-no Work - Engineering geologist Married Drug use-no Regular exercise-no Tobacco  15 years less than 1ppd quit 26 years ago  Risk Factors: Alcohol Use: 0 (06/22/2009) Exercise: no (06/22/2009)  Risk Factors: Smoking Status: quit > 6 months (06/22/2009)  Past Medical History: Allergic rhinitis Hypothyroidism - sees  Dr. Juleen China Low back pain Seizure disorder GERD Hypertension Anxiety Depression  Past Surgical History: Reviewed history from 06/26/2009 and no changes required. Lumbar fusion Heel spurs  Review of Systems       all otherwise negative per pt -  has low grade temp today, but dneies fever , ST, cough, CP, abd pain, n/v/d  or GU symptoms or rash, othe rjoint pain  Physical Exam  General:  alert and overweight-appearing.   Head:  normocephalic and atraumatic.   Eyes:  vision grossly intact, pupils equal, and pupils round.   Ears:  R ear normal.   Nose:  no external deformity and no nasal discharge.   Mouth:  no gingival abnormalities and  pharynx pink and moist.   Neck:  supple and no masses.   Lungs:  normal respiratory effort and normal breath sounds.   Heart:  normal rate and regular rhythm.   Abdomen:  soft, non-tender, and normal bowel sounds.   Msk:  spine and paravertebral nontender Extremities:  no edema, no erythema  Neurologic:  No cranial nerve deficits noted. Station and gait are normal. Plantar reflexes are down-going bilaterally. DTRs are symmetrical throughout. Sensory, motor and coordinative functions appear intact. Skin:  color normal and no rashes.   Psych:  depressed affect and moderately anxious.     Impression & Recommendations:  Problem # 1:  HIP PAIN, LEFT (ICD-719.45)  Her updated medication list for this problem includes:    Aspirin 81 Mg Tabs (Aspirin) .Marland Kitchen... 1 by mouth daily    Meloxicam 15 Mg Tabs (Meloxicam) .Marland Kitchen... 1 by mouth once daily as needed pain    Tramadol Hcl 50 Mg Tabs (Tramadol hcl) .Marland Kitchen... 1 by mouth q 6 hrs as needed more severe pain  Orders: T-Hip Comp Left Min 2-views (73510TC) Depo- Medrol 40mg  (J1030) Depo- Medrol 80mg  (J1040) Admin of Therapeutic Inj  intramuscular or subcutaneous (30865) c/w left hip bursitis, mild to mod, for depo IM, pred pack for home, consider ortho eval if not improved in3-5 days  Problem # 2:  ESSENTIAL HYPERTENSION, BENIGN (ICD-401.1)  Her updated medication list for this problem includes:    Amlodipine Besylate 10 Mg Tabs (Amlodipine besylate) .Marland Kitchen... 1po once daily uncontrolled, to incr the amlod as above, f/u BP at home and next visit  BP today: 142/70 Prior BP: 142/78 (09/24/2009)  Labs Reviewed: K+: 4.5  (05/22/2008) Creat: : 0.7 (05/22/2008)   Chol: 204 (01/13/2008)   HDL: 50.8 (01/13/2008)   LDL: DEL (01/13/2008)   TG: 178 (01/13/2008)  Problem # 3:  LOW BACK PAIN (ICD-724.2)  Her updated medication list for this problem includes:    Aspirin 81 Mg Tabs (Aspirin) .Marland Kitchen... 1 by mouth daily    Meloxicam 15 Mg Tabs (Meloxicam) .Marland Kitchen... 1 by mouth once daily as needed pain    Tramadol Hcl 50 Mg Tabs (Tramadol hcl) .Marland Kitchen... 1 by mouth q 6 hrs as needed more severe pain stable overall by hx and exam, ok to continue meds/tx as is   Problem # 4:  ANXIETY (ICD-300.00)  Her updated medication list for this problem includes:    Sertraline Hcl 100 Mg Tabs (Sertraline hcl) .Marland Kitchen... 1po once daily for incr sertraline to 100 mg as above   Complete Medication List: 1)  Carbamazepine 200 Mg Tabs (Carbamazepine) .Marland Kitchen.. 1 by mouth two times a day 2)  Synthroid 175 Mcg Tabs (Levothyroxine sodium) .... Take 1 tablet by mouth once a day 3)  Sertraline Hcl 100 Mg Tabs (Sertraline hcl) .Marland Kitchen.. 1po once daily 4)  Cetirizine Hcl 10 Mg Tabs (Cetirizine hcl) .Marland Kitchen.. 1 by mouth once daily prn 5)  Multivitamins Tabs (Multiple vitamin) .... Take 1 tablet by mouth once a day 6)  Vitamin B-12 1000 Mcg Tabs (Cyanocobalamin) .... Take 1 tablet by mouth once a day 7)  Calcium-d 600-200 Mg-unit Caps (Calcium carbonate-vitamin d) .... Take 2 tablet by mouth once a day 8)  Prevacid 15 Mg Cpdr (Lansoprazole) .... Take 1 tablet by mouth once a day 9)  Aspirin 81 Mg Tabs (Aspirin) .Marland Kitchen.. 1 by mouth daily 10)  Fish Oil Oil (Fish oil) .Marland Kitchen.. 1 by mouth daily 11)  Amlodipine Besylate 10 Mg Tabs (Amlodipine besylate) .Marland Kitchen.. 1po once daily  12)  Meloxicam 15 Mg Tabs (Meloxicam) .Marland Kitchen.. 1 by mouth once daily as needed pain 13)  Tramadol Hcl 50 Mg Tabs (Tramadol hcl) .Marland Kitchen.. 1 by mouth q 6 hrs as needed more severe pain 14)  Promethazine Hcl 25 Mg Tabs (Promethazine hcl) .Marland Kitchen.. 1po q 6 hrs as needed nausea 15)  Prednisone 10 Mg Tabs (Prednisone) .... 3po qd for  3days, then 2po qd for 3days, then 1po qd for 3days, then stop  Other Orders: Admin 1st Vaccine (02725) Flu Vaccine 68yrs + (36644)  Patient Instructions: 1)  you had the flu shot today 2)  you had the steroid shot today 3)  Please take all new medications as prescribed - the prednisone 4)  increase the amlodipine to 10 mg per day (and watch for any leg sweling that might occur) 5)  please avoid sleeping on your left side 6)  increase the sertraline to 100 mg per day 7)  Continue all previous medications as before this visit  8)  Please keep your appt in followup with Neurology as you do 9)  Please schedule a follow-up appointment in 2 months with CPX labs Prescriptions: PREDNISONE 10 MG TABS (PREDNISONE) 3po qd for 3days, then 2po qd for 3days, then 1po qd for 3days, then stop  #18 x 0   Entered and Authorized by:   Corwin Levins MD   Signed by:   Corwin Levins MD on 12/19/2009   Method used:   Print then Give to Patient   RxID:   [id number] AMLODIPINE BESYLATE 10 MG TABS (AMLODIPINE BESYLATE) 1po once daily  #90 x 3   Entered and Authorized by:   Corwin Levins MD   Signed by:   Corwin Levins MD on 12/19/2009   Method used:   Print then Give to Patient   RxID:   [id number] SERTRALINE HCL 100 MG TABS (SERTRALINE HCL) 1po once daily  #90 x 3   Entered and Authorized by:   Corwin Levins MD   Signed by:   Corwin Levins MD on 12/19/2009   Method used:   Print then Give to Patient   RxID:   [id number]     Flu Vaccine Consent Questions     Do you have a history of severe allergic reactions to this vaccine? no    Any prior history of allergic reactions to egg and/or gelatin? no    Do you have a sensitivity to the preservative Thimersol? no    Do you have a past history of Guillan-Barre Syndrome? no    Do you currently have an acute febrile illness? no    Have you ever had a severe reaction to latex? no    Vaccine information given and explained to patient? yes    Are  you currently pregnant? no    Lot Number:[id number]   Exp Date:10/19/2010   Site Given  Left Deltoid IMlu   Medication Administration  Injection # 1:    Medication: Depo- Medrol 40mg     Diagnosis: HIP PAIN, LEFT (ICD-719.45)    Route: IM    Site: LUOQ gluteus    Exp Date: 07/2012    Lot #: [id number]    Mfr: [employer]    Patient tolerated injection without complications    Given by: Zella Ball Ewing CMA (AAMA) (December 19, 2009 10:00 AM)  Injection # 2:    Medication: Depo- Medrol 80mg     Diagnosis: HIP PAIN, LEFT (ICD-719.45)    Route: IM  Site: LUOQ gluteus    Exp Date: 07/2012    Mfr: [employer]    Given by: Zella Ball Ewing CMA (AAMA) (December 19, 2009 10:00 AM)  Orders Added: 1)  Admin 1st Vaccine [90471] 2)  Flu Vaccine 10yrs + [16109] 3)  T-Hip Comp Left Min 2-views [73510TC] 4)  Depo- Medrol 40mg  [J1030] 5)  Depo- Medrol 80mg  [J1040] 6)  Admin of Therapeutic Inj  intramuscular or subcutaneous [96372] 7)  Est. Patient Level IV [60454]

## 2010-05-21 NOTE — Assessment & Plan Note (Signed)
Summary: PAIN IN LEFT LEG-LB   Vital Signs:  Patient profile:   65 year old female Height:      66 inches Weight:      193.50 pounds BMI:     31.34 O2 Sat:      97 % on Room air Temp:     98 degrees F oral Pulse rate:   73 / minute BP sitting:   142 / 68  (left arm) Cuff size:   regular  Vitals Entered By: Zella Ball Ewing CMA Duncan Dull) (January 03, 2010 2:04 PM)  O2 Flow:  Room air CC: left leg pain/RE   Primary Care Namita Yearwood:  Corwin Levins MD  CC:  left leg pain/RE.  History of Present Illness: here for acute visit - unfort despite tx last visit, overal pain to the left hip is worse, now more concentrated in the groin area and lateral left hip area, worse overall to stand at work for which she is very concerned over the work performance as she financially needs to very much cont working;  pain o/w as per HPI last visit.  No fall , injury or worsenig LE pain/weakness/numbness and no lower back discomfort.  Pt denies CP, worsening sob, doe, wheezing, orthopnea, pnd, worsening LE edema, palps, dizziness or syncope  Pt denies other new neuro symptoms such as headache, facial or extremity weakness  No fever, wt loss, night sweats, loss of appetite or other constitutional symptoms  denies worsening depressive symtpoms, suicidal ideaiton or panic.  Quite frustrated however  Problems Prior to Update: 1)  Depression  (ICD-311) 2)  Anxiety  (ICD-300.00) 3)  Hip Pain, Left  (ICD-719.45) 4)  Infectious Diarrhea  (ICD-009.2) 5)  Hypertension  (ICD-401.9) 6)  Essential Hypertension, Benign  (ICD-401.1) 7)  Abnormal Electrocardiogram  (ICD-794.31) 8)  Chest Pain  (ICD-786.50) 9)  Preventive Health Care  (ICD-V70.0) 10)  Weight Gain  (ICD-783.1) 11)  Asthmatic Bronchitis, Acute  (ICD-466.0) 12)  Gerd  (ICD-530.81) 13)  Seizure Disorder  (ICD-780.39) 14)  Low Back Pain  (ICD-724.2) 15)  Hypothyroidism  (ICD-244.9) 16)  Health Screening  (ICD-V70.0) 17)  Allergic Rhinitis   (ICD-477.9)  Medications Prior to Update: 1)  Carbamazepine 200 Mg  Tabs (Carbamazepine) .Marland Kitchen.. 1 By Mouth Two Times A Day 2)  Synthroid 175 Mcg Tabs (Levothyroxine Sodium) .... Take 1 Tablet By Mouth Once A Day 3)  Sertraline Hcl 100 Mg Tabs (Sertraline Hcl) .Marland Kitchen.. 1po Once Daily 4)  Cetirizine Hcl 10 Mg  Tabs (Cetirizine Hcl) .Marland Kitchen.. 1 By Mouth Once Daily Prn 5)  Multivitamins   Tabs (Multiple Vitamin) .... Take 1 Tablet By Mouth Once A Day 6)  Vitamin B-12 1000 Mcg Tabs (Cyanocobalamin) .... Take 1 Tablet By Mouth Once A Day 7)  Calcium-D 600-200 Mg-Unit Caps (Calcium Carbonate-Vitamin D) .... Take 2 Tablet By Mouth Once A Day 8)  Prevacid 15 Mg Cpdr (Lansoprazole) .... Take 1 Tablet By Mouth Once A Day 9)  Aspirin 81 Mg  Tabs (Aspirin) .Marland Kitchen.. 1 By Mouth Daily 10)  Fish Oil   Oil (Fish Oil) .Marland Kitchen.. 1 By Mouth Daily 11)  Amlodipine Besylate 10 Mg Tabs (Amlodipine Besylate) .Marland Kitchen.. 1po Once Daily 12)  Meloxicam 15 Mg Tabs (Meloxicam) .Marland Kitchen.. 1 By Mouth Once Daily As Needed Pain 13)  Tramadol Hcl 50 Mg Tabs (Tramadol Hcl) .Marland Kitchen.. 1 By Mouth Q 6 Hrs As Needed More Severe Pain 14)  Promethazine Hcl 25 Mg Tabs (Promethazine Hcl) .Marland Kitchen.. 1po Q 6 Hrs As Needed Nausea  15)  Prednisone 10 Mg Tabs (Prednisone) .... 3po Qd For 3days, Then 2po Qd For 3days, Then 1po Qd For 3days, Then Stop  Current Medications (verified): 1)  Carbamazepine 200 Mg  Tabs (Carbamazepine) .Marland Kitchen.. 1 By Mouth Two Times A Day 2)  Synthroid 175 Mcg Tabs (Levothyroxine Sodium) .... Take 1 Tablet By Mouth Once A Day 3)  Sertraline Hcl 100 Mg Tabs (Sertraline Hcl) .Marland Kitchen.. 1po Once Daily 4)  Cetirizine Hcl 10 Mg  Tabs (Cetirizine Hcl) .Marland Kitchen.. 1 By Mouth Once Daily Prn 5)  Multivitamins   Tabs (Multiple Vitamin) .... Take 1 Tablet By Mouth Once A Day 6)  Vitamin B-12 1000 Mcg Tabs (Cyanocobalamin) .... Take 1 Tablet By Mouth Once A Day 7)  Calcium-D 600-200 Mg-Unit Caps (Calcium Carbonate-Vitamin D) .... Take 2 Tablet By Mouth Once A Day 8)  Prevacid 15 Mg Cpdr  (Lansoprazole) .... Take 1 Tablet By Mouth Once A Day 9)  Aspirin 81 Mg  Tabs (Aspirin) .Marland Kitchen.. 1 By Mouth Daily 10)  Fish Oil   Oil (Fish Oil) .Marland Kitchen.. 1 By Mouth Daily 11)  Amlodipine Besylate 10 Mg Tabs (Amlodipine Besylate) .Marland Kitchen.. 1po Once Daily 12)  Meloxicam 15 Mg Tabs (Meloxicam) .Marland Kitchen.. 1 By Mouth Once Daily As Needed Pain 13)  Promethazine Hcl 25 Mg Tabs (Promethazine Hcl) .Marland Kitchen.. 1po Q 6 Hrs As Needed Nausea 14)  Oxycodone Hcl 5 Mg Caps (Oxycodone Hcl) .Marland Kitchen.. 1 - 2 By Mouth Q 6 Hrs As Needed Pain  Allergies (verified): 1)  ! Biaxin 2)  ! Doxycycline  Past History:  Past Medical History: Last updated: 12/19/2009 Allergic rhinitis Hypothyroidism - sees  Dr. Juleen China Low back pain Seizure disorder GERD Hypertension Anxiety Depression  Past Surgical History: Last updated: 06/26/2009 Lumbar fusion Heel spurs  Social History: Last updated: 06/26/2009 Alcohol use-no Work - Engineering geologist Married Drug use-no Regular exercise-no Tobacco  15 years less than 1ppd quit 26 years ago  Risk Factors: Alcohol Use: 0 (06/22/2009) Exercise: no (06/22/2009)  Risk Factors: Smoking Status: quit > 6 months (06/22/2009)  Review of Systems       all otherwise negative per pt -    Physical Exam  General:  alert and overweight-appearing.   Head:  normocephalic and atraumatic.   Eyes:  vision grossly intact, pupils equal, and pupils round.   Ears:  R ear normal and L ear normal.   Nose:  no external deformity and no nasal discharge.   Mouth:  no gingival abnormalities and pharynx pink and moist.   Neck:  supple and no masses.   Lungs:  normal respiratory effort and normal breath sounds.   Heart:  normal rate and regular rhythm.   Msk:  no spine or paravertebral tender;  no lateral hip tender or groin tender or swelling or LA;  left knee FROM, NT and no effusion;  ROM of left hip with severe pain to ext rotation, milder to hip flexion and little to none to int rotation Pulses:  1+ dorsalis pedis  bilat Extremities:  no edema, no erythema  Neurologic:  cranial nerves II-XII intact, strength normal in all extremities, gait normal, and DTRs symmetrical and normal.   Skin:  color normal and no rashes.   Psych:  dysphoric affect and moderately anxious.     Impression & Recommendations:  Problem # 1:  HIP PAIN, LEFT (ICD-719.45)  The following medications were removed from the medication list:    Tramadol Hcl 50 Mg Tabs (Tramadol hcl) .Marland Kitchen... 1 by mouth q  6 hrs as needed more severe pain Her updated medication list for this problem includes:    Aspirin 81 Mg Tabs (Aspirin) .Marland Kitchen... 1 by mouth daily    Meloxicam 15 Mg Tabs (Meloxicam) .Marland Kitchen... 1 by mouth once daily as needed pain    Oxycodone Hcl 5 Mg Caps (Oxycodone hcl) .Marland Kitchen... 1 - 2 by mouth q 6 hrs as needed pain symtpomatically worse, now mod to severe,  exam with severe pain to ext rotate - will need MRI left hip, refer ortho/dr yates, d/c tramadol - for oxycodone as needed ; recent left hip film d/w pt  Orders: Orthopedic Surgeon Referral (Ortho Surgeon) Radiology Referral (Radiology)  Problem # 2:  ANXIETY (ICD-300.00)  Her updated medication list for this problem includes:    Sertraline Hcl 100 Mg Tabs (Sertraline hcl) .Marland Kitchen... 1po once daily stable overall by hx and exam, ok to continue meds/tx as is   Problem # 3:  HYPERTENSION (ICD-401.9)  Her updated medication list for this problem includes:    Amlodipine Besylate 10 Mg Tabs (Amlodipine besylate) .Marland Kitchen... 1po once daily  BP today: 142/68 Prior BP: 142/70 (12/19/2009)  Labs Reviewed: K+: 4.5 (05/22/2008) Creat: : 0.7 (05/22/2008)   Chol: 204 (01/13/2008)   HDL: 50.8 (01/13/2008)   LDL: DEL (01/13/2008)   TG: 178 (01/13/2008) mild elev today, likely situational, ok to follow, continue same treatment   Complete Medication List: 1)  Carbamazepine 200 Mg Tabs (Carbamazepine) .Marland Kitchen.. 1 by mouth two times a day 2)  Synthroid 175 Mcg Tabs (Levothyroxine sodium) .... Take 1 tablet by  mouth once a day 3)  Sertraline Hcl 100 Mg Tabs (Sertraline hcl) .Marland Kitchen.. 1po once daily 4)  Cetirizine Hcl 10 Mg Tabs (Cetirizine hcl) .Marland Kitchen.. 1 by mouth once daily prn 5)  Multivitamins Tabs (Multiple vitamin) .... Take 1 tablet by mouth once a day 6)  Vitamin B-12 1000 Mcg Tabs (Cyanocobalamin) .... Take 1 tablet by mouth once a day 7)  Calcium-d 600-200 Mg-unit Caps (Calcium carbonate-vitamin d) .... Take 2 tablet by mouth once a day 8)  Prevacid 15 Mg Cpdr (Lansoprazole) .... Take 1 tablet by mouth once a day 9)  Aspirin 81 Mg Tabs (Aspirin) .Marland Kitchen.. 1 by mouth daily 10)  Fish Oil Oil (Fish oil) .Marland Kitchen.. 1 by mouth daily 11)  Amlodipine Besylate 10 Mg Tabs (Amlodipine besylate) .Marland Kitchen.. 1po once daily 12)  Meloxicam 15 Mg Tabs (Meloxicam) .Marland Kitchen.. 1 by mouth once daily as needed pain 13)  Promethazine Hcl 25 Mg Tabs (Promethazine hcl) .Marland Kitchen.. 1po q 6 hrs as needed nausea 14)  Oxycodone Hcl 5 Mg Caps (Oxycodone hcl) .Marland Kitchen.. 1 - 2 by mouth q 6 hrs as needed pain  Patient Instructions: 1)  Please take all new medications as prescribed - the oxycodone 2)  call for refill as needed 3)  You will be contacted about the referral(s) to: orthopedic, and MRI left hip 4)  Continue all previous medications as before this visit  5)  Please schedule a follow-up appointment in 3 months with CPX labs  (ok to cancel any other appts) Prescriptions: OXYCODONE HCL 5 MG CAPS (OXYCODONE HCL) 1 - 2 by mouth q 6 hrs as needed pain  #60 x 0   Entered and Authorized by:   Corwin Levins MD   Signed by:   Corwin Levins MD on 01/03/2010   Method used:   Print then Give to Patient   RxID:   [id number]

## 2010-06-24 ENCOUNTER — Telehealth: Payer: Self-pay | Admitting: Internal Medicine

## 2010-06-25 ENCOUNTER — Ambulatory Visit (INDEPENDENT_AMBULATORY_CARE_PROVIDER_SITE_OTHER): Payer: 59 | Admitting: Internal Medicine

## 2010-06-25 ENCOUNTER — Encounter: Payer: Self-pay | Admitting: Internal Medicine

## 2010-06-25 ENCOUNTER — Encounter (INDEPENDENT_AMBULATORY_CARE_PROVIDER_SITE_OTHER): Payer: Self-pay | Admitting: *Deleted

## 2010-06-25 DIAGNOSIS — R079 Chest pain, unspecified: Secondary | ICD-10-CM

## 2010-06-25 DIAGNOSIS — I10 Essential (primary) hypertension: Secondary | ICD-10-CM

## 2010-06-25 DIAGNOSIS — F411 Generalized anxiety disorder: Secondary | ICD-10-CM

## 2010-07-02 NOTE — Letter (Signed)
Summary: Out of Work  [facility]  [address] [place], [place] [address]   Phone: [phone]  Fax: [phone]    June 25, 2010   Employee:  RAEANA BLINN    To Whom It May Concern:   For Medical reasons, please excuse the above named employee from work for the following dates:  Start:   06/24/2010  End:   06/26/2010  If you need additional information, please feel free to contact our office.         Sincerely,    Dr. Oliver Barre

## 2010-07-02 NOTE — Assessment & Plan Note (Signed)
Summary: arthritis chest pain/light headed/lb   Vital Signs:  Patient profile:   65 year old female Height:      66 inches Weight:      199.50 pounds BMI:     32.32 O2 Sat:      97 % on Room air Temp:     97.7 degrees F oral Pulse rate:   65 / minute BP sitting:   132 / 72  (left arm) Cuff size:   large  Vitals Entered By: Zella Ball Ewing CMA Duncan Dull) (June 25, 2010 11:15 AM)  O2 Flow:  Room air CC: Chest heaviness and pain, lightheaded/RE   Primary Care Provider:  Corwin Levins MD  CC:  Chest heaviness and pain and lightheaded/RE.  History of Present Illness: here with flare of CP , similar to that she had last yr with complete w/u with stress test, cxr and cards evalaution c/w "arthritis of the chest";  has been lifting and twisting and unpacking boxes more at work;  has been under more stress recently and improved with sertraline increased to 100mg  - Denies worsening depressive symptoms, suicidal ideation, or panic, though has ongoing anxiety.  CP is characterized as sharp, tender to touch right sternal area, but no redness, swelling, rash, and nonplueiritc.  No HA, fever, ST, cough, sob, wheezing ;  Pt denies orthopnea, pnd, worsening LE edema, palps,  or syncope but has occasoinal lightheadedness.  Pt denies new neuro symptoms such as headache, facial or extremity weakness Pt denies polydipsia, polyuria.  Pain without radiation, sob, n/v, diaphoresis, exertional nature, though was quite severe yesterday and had to leave work.  No fever, wt loss, night sweats, loss of appetite or other constitutional symptoms .  Looking forward to retirement in april this yr  Problems Prior to Update: 1)  Depression  (ICD-311) 2)  Anxiety  (ICD-300.00) 3)  Hip Pain, Left  (ICD-719.45) 4)  Infectious Diarrhea  (ICD-009.2) 5)  Hypertension  (ICD-401.9) 6)  Essential Hypertension, Benign  (ICD-401.1) 7)  Abnormal Electrocardiogram  (ICD-794.31) 8)  Chest Pain  (ICD-786.50) 9)  Preventive Health Care   (ICD-V70.0) 10)  Weight Gain  (ICD-783.1) 11)  Asthmatic Bronchitis, Acute  (ICD-466.0) 12)  Gerd  (ICD-530.81) 13)  Seizure Disorder  (ICD-780.39) 14)  Low Back Pain  (ICD-724.2) 15)  Hypothyroidism  (ICD-244.9) 16)  Health Screening  (ICD-V70.0) 17)  Allergic Rhinitis  (ICD-477.9)  Medications Prior to Update: 1)  Carbamazepine 200 Mg  Tabs (Carbamazepine) .Marland Kitchen.. 1 By Mouth Two Times A Day 2)  Synthroid 175 Mcg Tabs (Levothyroxine Sodium) .... Take 1 Tablet By Mouth Once A Day 3)  Sertraline Hcl 100 Mg Tabs (Sertraline Hcl) .Marland Kitchen.. 1po Once Daily 4)  Cetirizine Hcl 10 Mg  Tabs (Cetirizine Hcl) .Marland Kitchen.. 1 By Mouth Once Daily Prn 5)  Multivitamins   Tabs (Multiple Vitamin) .... Take 1 Tablet By Mouth Once A Day 6)  Vitamin B-12 1000 Mcg Tabs (Cyanocobalamin) .... Take 1 Tablet By Mouth Once A Day 7)  Calcium-D 600-200 Mg-Unit Caps (Calcium Carbonate-Vitamin D) .... Take 2 Tablet By Mouth Once A Day 8)  Prevacid 15 Mg Cpdr (Lansoprazole) .... Take 1 Tablet By Mouth Once A Day 9)  Aspirin 81 Mg  Tabs (Aspirin) .Marland Kitchen.. 1 By Mouth Daily 10)  Fish Oil   Oil (Fish Oil) .Marland Kitchen.. 1 By Mouth Daily 11)  Amlodipine Besylate 10 Mg Tabs (Amlodipine Besylate) .Marland Kitchen.. 1po Once Daily 12)  Meloxicam 15 Mg Tabs (Meloxicam) .Marland Kitchen.. 1 By Mouth Once  Daily As Needed Pain 13)  Promethazine Hcl 25 Mg Tabs (Promethazine Hcl) .Marland Kitchen.. 1po Q 6 Hrs As Needed Nausea 14)  Oxycodone Hcl 5 Mg Caps (Oxycodone Hcl) .Marland Kitchen.. 1 - 2 By Mouth Q 6 Hrs As Needed Pain  Current Medications (verified): 1)  Carbamazepine 200 Mg  Tabs (Carbamazepine) .Marland Kitchen.. 1 By Mouth Two Times A Day 2)  Synthroid 175 Mcg Tabs (Levothyroxine Sodium) .... Take 1 Tablet By Mouth Once A Day 3)  Sertraline Hcl 100 Mg Tabs (Sertraline Hcl) .Marland Kitchen.. 1po Once Daily 4)  Cetirizine Hcl 10 Mg  Tabs (Cetirizine Hcl) .Marland Kitchen.. 1 By Mouth Once Daily Prn 5)  Multivitamins   Tabs (Multiple Vitamin) .... Take 1 Tablet By Mouth Once A Day 6)  Vitamin B-12 1000 Mcg Tabs (Cyanocobalamin) .... Take 1  Tablet By Mouth Once A Day 7)  Calcium-D 600-200 Mg-Unit Caps (Calcium Carbonate-Vitamin D) .... Take 2 Tablet By Mouth Once A Day 8)  Prevacid 15 Mg Cpdr (Lansoprazole) .... Take 1 Tablet By Mouth Once A Day 9)  Aspirin 81 Mg  Tabs (Aspirin) .Marland Kitchen.. 1 By Mouth Daily 10)  Fish Oil   Oil (Fish Oil) .Marland Kitchen.. 1 By Mouth Daily 11)  Amlodipine Besylate 10 Mg Tabs (Amlodipine Besylate) .Marland Kitchen.. 1po Once Daily 12)  Meloxicam 15 Mg Tabs (Meloxicam) .Marland Kitchen.. 1 By Mouth Once Daily As Needed Pain 13)  Promethazine Hcl 25 Mg Tabs (Promethazine Hcl) .Marland Kitchen.. 1po Q 6 Hrs As Needed Nausea 14)  Tramadol Hcl 100 Mg Xr24h-Tab (Tramadol Hcl) .Marland Kitchen.. 1po Once Daily As Needed  Allergies (verified): 1)  ! Biaxin 2)  ! Doxycycline  Past History:  Past Medical History: Last updated: 12/19/2009 Allergic rhinitis Hypothyroidism - sees  Dr. Juleen China Low back pain Seizure disorder GERD Hypertension Anxiety Depression  Past Surgical History: Last updated: 06/26/2009 Lumbar fusion Heel spurs  Social History: Last updated: 06/26/2009 Alcohol use-no Work - Engineering geologist Married Drug use-no Regular exercise-no Tobacco  15 years less than 1ppd quit 26 years ago  Risk Factors: Alcohol Use: 0 (06/22/2009) Exercise: no (06/22/2009)  Risk Factors: Smoking Status: quit > 6 months (06/22/2009)  Review of Systems       all otherwise negative per pt -    Physical Exam  General:  alert and overweight-appearing.   Head:  normocephalic and atraumatic.   Eyes:  vision grossly intact, pupils equal, and pupils round.   Ears:  R ear normal and L ear normal.   Nose:  no external deformity and no nasal discharge.   Mouth:  no gingival abnormalities and pharynx pink and moist.   Neck:  supple and no masses.   Lungs:  normal respiratory effort and normal breath sounds.   Heart:  normal rate and regular rhythm.   Abdomen:  soft, non-tender, and normal bowel sounds.   Msk:  no joint tenderness and no joint swelling.  except for tender  right upper sternal area repoducing her pain Extremities:  no edema, no erythema  Neurologic:  strength normal in all extremities and gait normal.   Skin:  color normal and no rashes.   Psych:  dysphoric affect and moderately anxious.     Impression & Recommendations:  Problem # 1:  CHEST PAIN (ICD-786.50) c/w MSK - costochondriitis by exam, ecg reveiwed with pt - NSC from previous, and no acute - sinus without acute;  do not feel cxr or further evaluation is needed ; tx for MSK pain with pain med;  d/w pt who agrees,  reassured, to  f/u any worsening symptoms; work note given  Problem # 2:  ANXIETY (ICD-300.00)  Her updated medication list for this problem includes:    Sertraline Hcl 100 Mg Tabs (Sertraline hcl) .Marland Kitchen... 1po once daily  Discussed medication use and relaxation techniques.  stable overall by hx and exam, ok to continue meds/tx as is   Problem # 3:  HYPERTENSION (ICD-401.9)  Her updated medication list for this problem includes:    Amlodipine Besylate 10 Mg Tabs (Amlodipine besylate) .Marland Kitchen... 1po once daily  BP today: 132/72 Prior BP: 142/68 (01/03/2010)  Labs Reviewed: K+: 4.5 (05/22/2008) Creat: : 0.7 (05/22/2008)   Chol: 204 (01/13/2008)   HDL: 50.8 (01/13/2008)   LDL: DEL (01/13/2008)   TG: 178 (01/13/2008) stable overall by hx and exam, ok to continue meds/tx as is   Complete Medication List: 1)  Carbamazepine 200 Mg Tabs (Carbamazepine) .Marland Kitchen.. 1 by mouth two times a day 2)  Synthroid 175 Mcg Tabs (Levothyroxine sodium) .... Take 1 tablet by mouth once a day 3)  Sertraline Hcl 100 Mg Tabs (Sertraline hcl) .Marland Kitchen.. 1po once daily 4)  Cetirizine Hcl 10 Mg Tabs (Cetirizine hcl) .Marland Kitchen.. 1 by mouth once daily prn 5)  Multivitamins Tabs (Multiple vitamin) .... Take 1 tablet by mouth once a day 6)  Vitamin B-12 1000 Mcg Tabs (Cyanocobalamin) .... Take 1 tablet by mouth once a day 7)  Calcium-d 600-200 Mg-unit Caps (Calcium carbonate-vitamin d) .... Take 2 tablet by mouth once a  day 8)  Prevacid 15 Mg Cpdr (Lansoprazole) .... Take 1 tablet by mouth once a day 9)  Aspirin 81 Mg Tabs (Aspirin) .Marland Kitchen.. 1 by mouth daily 10)  Fish Oil Oil (Fish oil) .Marland Kitchen.. 1 by mouth daily 11)  Amlodipine Besylate 10 Mg Tabs (Amlodipine besylate) .Marland Kitchen.. 1po once daily 12)  Meloxicam 15 Mg Tabs (Meloxicam) .Marland Kitchen.. 1 by mouth once daily as needed pain 13)  Promethazine Hcl 25 Mg Tabs (Promethazine hcl) .Marland Kitchen.. 1po q 6 hrs as needed nausea 14)  Tramadol Hcl 100 Mg Xr24h-tab (Tramadol hcl) .Marland Kitchen.. 1po once daily as needed  Patient Instructions: 1)  Please take all new medications as prescribed 2)  Continue all previous medications as before this visit  3)  You are given the work note today 4)  Please schedule a follow-up appointment in 2 months for CPX with labs Prescriptions: TRAMADOL HCL 100 MG XR24H-TAB (TRAMADOL HCL) 1po once daily as needed  #30 x 1   Entered and Authorized by:   Corwin Levins MD   Signed by:   Corwin Levins MD on 06/25/2010   Method used:   Print then Give to Patient   RxID:   [id number]    Orders Added: 1)  Est. Patient Level IV [27253]

## 2010-07-02 NOTE — Progress Notes (Signed)
Summary: CP  Phone Note Call from Patient Call back at [facility] Phone [phone]   Caller: Patient Summary of Call: Pt called stating she was havng CP and lightheadedness. Pt states she was told by Cardiology that CP was arthritis related. Pt denied any SOB  weakness or radiating pain but decribed CP as "someone sitting on her chest". I advised pt to go to ER or UC now, sxs may be cardiac  (pt has appt tomorrow at 11a) but refused stating she will wait for appt and hung up. Initial call taken by: Margaret Pyle, CMA,  June 24, 2010 1:22 PM  Follow-up for Phone Call        noted Follow-up by: Corwin Levins MD,  June 24, 2010 1:56 PM

## 2010-09-06 NOTE — Op Note (Signed)
[facility]  Patient:    Beverly Ballard, Beverly Ballard Visit Number: [id number] MRN: [id number]          Service Type: SUR Location: [id number] Attending Physician:  Pierce Crane Dictated by:   Javier Docker, M.D. Proc. Date: 08/27/01 Admit Date:  08/27/2001                             Operative Report  PREOPERATIVE DIAGNOSES: 1. Spinal stenosis L4-5. 2. Synovial cyst.  POSTOPERATIVE DIAGNOSES: 1. Spinal stenosis L4-5. 2. Synovial cyst.  PROCEDURE: 1. Lumbar central decompression L4-5 with bilateral lateral recess    decompression. 2. Excision of a synovial cyst with biopsy of synovial cyst. 3. Foraminotomies of L5.  SURGEON:  Javier Docker, M.D.  ASSISTANT:  Sharolyn Douglas, M.D.  ANESTHESIA:  General.  BRIEF HISTORY AND INDICATIONS:  A 65 year old with refractory lower extremity radicular pain secondary to spinal stenosis and a synovial cyst. Operative intervention was indicated for decompression, excision of the cyst with biopsy. Risk and benefits discussed, including bleeding, infection, injury to neurovascular structures, recurrent cyst, need for fusion in the future, etc.  TECHNIQUE:  The patient was placed in the supine position. After adequate induction of general anesthesia and one gram of Kefzol, the patient was placed prone on the Andrews frame, all bony prominences well padded. The lumbar region was prepped and draped in the usual sterile fashion. Two 18-gauge spinal needles were utilized to localize the L4-5 interspace, confirmed with x-ray. Incision was made through the spinous process of L4-5 and subcutaneous tissue with dissection utilizing electrocautery to achieve hemostasis. The paraspinous muscles were elevated from the lamina of L4 and 5 bilaterally. McCullough retractor was placed. Operating microscope was draped and brought into the surgical field.  Confirmatory radiographs were obtained with Kochers on the L4 and L5  spinous processes. Central lumbar decompression was performed, partial removal of the L4 and L5 spinous processes. In piecemeal fashion, 2 and 3 mm Kerrison were utilized to perform a central decompression. This was carried out laterally to the facet joints. Severe lateral recess stenosis in the left secondary to facet hypertrophy, ligamentum flavum hypertrophy and an adherence to the thecal sac secondary to the synovial cyst. In meticulous fashion, the thecal sac was mobilized from the ligamentum flavum. This was decompressed with a 2 and 3 mm Kerrison. An L5 foraminotomy was performed further _______ the L5 root. Noted then was a synovial cyst. The synovial cyst was entered, fluid evacuated. This was meticulously freed from the thecal sac, removed, and sent to pathology for appropriate evaluation. Adhered portion of the synovial cyst to the thecal sac was not able to be mobilized and therefore, this was left due to the risk of dural rent. There was extensive epidural venous plexus and electrocautery was utilized to achieve hemostasis. There was no evidence of disc herniation. The L4 root was found to be widely mobile and patent. There was a wide foramen of L4 and of L5. The _______ lateral recess was stenotic and a decompression was performed as well. The wound was copiously irrigated. The Valsalva maneuver was performed. There was no evidence of active leakage.  Next, thrombin-soaked Gelfoam was placed in the laminotomy defect. It was meticulously dried. There was no evidence of active bleeding. McCullough retractor was removed. Paraspinous muscles inspected with no evidence of active bleeding. Dorsolumbar fascia was therefore reapproximated with #1 Vicryl in a figure-of-eight suture, subcutaneous reapproximated with #  2-0 Vicryl simple suture, and the skin was reapproximated with staples and the wound dressed sterilely. The patient was placed supine on a hospital bed, extubated without  difficulty, and transported to the recovery room in satisfactory condition.  The patient tolerated the procedure well with no complication.  ESTIMATED BLOOD LOSS:  20 cc. Dictated by:   Javier Docker, M.D. Attending Physician:  Pierce Crane DD:  08/27/01 TD:  08/28/01 Job: [id number]

## 2010-09-06 NOTE — H&P (Signed)
[facility]  Patient:    Beverly Ballard, Beverly Ballard Visit Number: [id number] MRN: [id number]          Service Type: Attending:  Javier Docker, M.D. Dictated by:   Sammuel Cooper. Mahar, P.A. Adm. Date:  08/27/01                           History and Physical  DATE OF BIRTH:  March 06, 2046  CHIEF COMPLAINT:  Left leg pain.  HISTORY OF PRESENT ILLNESS:  The patient is a 65 year old white female with left lower extremity pain for approximately one year.  It has been getting progressively worse.  She was given an epidural steroid injection which gave her temporarily relief for one to two days.  CT scan revealed nerve foot compression and a large extradural defect.  She has failed conservative management.  It was thought that her best course of management at this point would be a surgical decompression.  The risks and benefits of the surgery were discussed with the patient by Javier Docker, M.D.  She indicated understanding and wished to proceed.  ALLERGIES:  No known drug allergies.  The patient reports that BIAXIN and DOXYCYCLINE interfere with her Tegretol.  MEDICATIONS: 1. Dilantin 100 mg three tablets p.o. q.a.m. 2. Synthroid 0.15 mg p.o. q.a.m. 3. Zoloft 100 mg p.o. q.a.m. 4. Tegretol 200 mg two tablets p.o. q.a.m., two tablets p.o. after lunch, and    one-and-a-half tablets p.o. at 6 p.m. 5. Norethindrone 5 mg one-half tablet p.o. q.d. 6. Vivelle patch 0.1 mg changed every three to four days.  PAST MEDICAL HISTORY:  Significant for: 1. Epilepsy.  Her last seizure was in 1983. 2. Osteoarthritis. 3. Hypothyroidism which is secondary to radioactive iodine treatment of    hyperthyroidism. 4. Gastroesophageal reflux disease.  PAST SURGICAL HISTORY:  The patient had "heel spurs" removed in the early 1990s to bilateral feet.  No other surgeries.  SOCIAL HISTORY:  The patient denies tobacco use.  Denies alcohol use.  She is married.  She works in Engineering geologist.  Her  husband and her daughter will be available to help her throughout her postoperative course.  FAMILY HISTORY:  Mother alive at age 11 and healthy with only osteoarthritis. Father alive at age 51 and in excellent health.  REVIEW OF SYSTEMS:  The patient denies any fever, chills, sweats, or bleeding tendencies.  CNS:  Denies blurred vision, double vision, seizures, headache, or paralysis.  Cardiovascular:  Denies chest pain, angina, orthopnea, claudication, or palpitations.  Pulmonary:  Denies shortness of breath, productive cough, or hemoptysis.  GI:  Denies nausea, vomiting, constipation, diarrhea, melena, or bloody stool.  GU:  Denies dysuria, hematuria, or discharge.  PHYSICAL EXAMINATION:  VITAL SIGNS:  The blood pressure is 138/80, respirations 16 and unlabored, and pulse 72 and regular.  GENERAL APPEARANCE:  The patient is a 64 year old white female.  She is alert and oriented and in no acute distress.  Well nourished and well groomed.  She appears her stated age.  Very pleasant and cooperative to exam.  HEENT:  The head is normocephalic and atraumatic.  The pupils are equal, round, and reactive to light.  Extraocular movements intact.  Nares patent bilaterally.  The pharynx is clear without any erythema or exudate.  NECK:  Soft to palpation.  No lymphadenopathy, thyromegaly, or bruits are appreciated.  CHEST:  Clear to auscultation bilaterally.  No rales, rhonchi, stridor, wheezes, or friction rubs.  BREASTS:  Not  pertinent and no performed.  HEART:  S1 and S2.  Regular rate and rhythm.  No murmurs, rubs, or gallops noted.  ABDOMEN:  Soft to palpation.  Positive bowel sounds.  Nontender and nondistended.  No organomegaly noted.  GENITOURINARY:  Not pertinent and not performed.  EXTREMITIES:  Left EHL 4/5 strength.  Left gastrocsoleus 4/5 strength. Sensation is slightly decreased in the L5 dermatomal pattern.  SKIN:  Intact without any lesions or  rashes.  RADIOLOGY:  For CT, see HPI.  IMPRESSION: 1. Spinal stenosis and synovial cyst. 2. Epilepsy.  Last seizure 20 years ago. 3. Osteoarthritis. 4. Hypothyroidism. 5. Gastroesophageal reflux disease.  PLAN: 1. Admit to [facility] on Aug 27, 2001, for a lumbar decompression of    L4-5 and excision of synovial cyst.  This will be done by Javier Docker,    M.D. 2. The patients neurologist is Gustavus Messing. Orlin Hilding, M.D., at [facility]    Neurology.  She has sent [place] a letter of medical clearance indicating that    the patient should take her Dilantin as usual.  If she is going to surgery    in the early morning, she should take Dilantin as soon as possible after    surgery. 3. If one or more Tegretol doses falls in her NPO period, she is to take an    early extra dose just before NPO and if needed, a late dose after p.o.    again, but try to avoid missing any doses if possible. 4. The patients primary care physician is [name], M.D.  She is seeing    him on Monday, Aug 23, 2001, for medical clearance.  A letter of clearance    is still pending at this point. Dictated by:   Sammuel Cooper. Mahar, P.A. Attending:  Javier Docker, M.D. DD:  08/19/01 TD:  08/19/01 Job: [id number]

## 2010-09-06 NOTE — Op Note (Signed)
NAMEMarland Ballard  EMON, MIGGINS                 ACCOUNT NO.:  [id number]   MEDICAL RECORD NO.:  [id number]          PATIENT TYPE:  INP   LOCATION:  5037                         FACILITY:  [facility]   PHYSICIAN:  Mark C. Ophelia Charter, M.D.    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   PROCEDURE:  L4-5 left TLIF, bilateral gutter fusion, PEEK interbody  spacer, Vitoss, iliac crest aspirate (3-6 fusion).   SURGEON:  Mark C. Ophelia Charter, M.D.   ASSISTANT:  Kerrin Champagne, M.D.   ANESTHESIA:  General plus local.   ESTIMATED BLOOD LOSS:  250 mL.   DRAINS:  One Hemovac, lost at patient positioning time at the end of the  case.   PROCEDURE:  After the induction of general anesthesia, the patient was  placed prone on chest rolls.  Careful padding and positioning.  Shoulders were placed at abducted position, elbows at 90, padding to the  axillae bilaterally.  Old incision was noted.  The area was secured with  towels after 10-15 drapes had been applied, Betadine and a preoperative  timeout, Ancef IV and DuraPrep.  Sterile skin marker was used for the  old incision to mark it.  The area was secured with towels.  Betadine Vi-  Drape applied and laminectomy sheath.   Incision was made at the midline extending proximally and distally.  Dissection down to the area where the missing spinous process was  present, subperiosteal dissection on the level above and below, a  portion of the spinous process above and below was removed.  Canal was  entered.  There was still some remaining bone posteriorly, remaining for  the posterior arch.  Some of this may have been reformed or may have  been  left with partial laminotomy from the previous procedure.  There  was a large cyst on the right side compressing about 50% of the dura.  Bone was removed and the pedicle was identified above and below the 4-5  level.  C-arm was draped, brought in and confirmed this was the  appropriate level with a #4 Penfield at the level of the disk space.  Bone was removed laterally, taking the facet segmental arch piece to be  used for the interbody.  Diskectomy was performed with the nerve  carefully protected.  Sequential reamers, back chisels and trials up to  11 mm.  Angled curets, Epstein-type curet, straight curets and end-plate  rasps were all used for preparation.  An 78 gave a nice fit, checked  under fluoroscopy.  Large pieces of bone were placed anteriorly into the  disk space.  Trial placed in again and then the PEEK spacer impacted,  checked on its way in.  It was advanced following the radiographic  markers until it was at the midline and appropriate position.  After  final positioning and confirmation, nerve root was intact.  Transverse  processes were identified, decorticated.  Using C-arm, pedicle screws  were placed with sharp awl followed by the pedicle finder.  Use of ball-  tip feeler followed by tap, ball-tip feeler again and then 45 mm x 6.5  pedicle screw.  All four screws were identical.  [employer] EBI  system was used.  Forty millimeter titanium rods were selected.  Caps  were placed on loosely compression and tightened down.  On the right  superior pedicle screw, which was an L4, as it was being twisted down  first click was fine.  The second click there was a crack in rotation of  the screw.  The crack had occurred in the pedicle.  Rod was secure and  fluoroscopy was taken after tightening down the opposite side for  visualization.  Obliques right and left, lateral AP view were all  inspected and checked with excellent position and alignment, good  position of the  pedicle screw.  Pedicle screw was down.  It was felt  best to put a cross-linking bar across for additional stability.  The  tip of the rod was present in all four corners, sticking out.  Rod was  secure and had been compressed.  Cross-linking bar was placed and a  little bit of the facet on the left side had to be burred away in order  for the cross-link to fit down securely and screw down.  It was  tightened until it clicked all three places.  Deep fascia was closed  with 0 Vicryl.  Hemovac drain was placed, 2-0 Vicryl was placed in the  subcutaneous tissue, 4-0 Vicryl for subcuticular closure.  A tincture of  benzoin and Steri-Strips, Marcaine infiltration.  His dressings are  being applied.  The patient is being transferred, drain got caught and  pulled loose.  It was felt that it was best to leave it out rather than  open the wound to replace it.  EBL had only been 250 mL with one-level  fusion.  There was excellent decompression of the dura.  No bone left in  the midline at the dura just prior to closure.  The patient tolerated  the procedure well and was transferred to the recovery room in stable  condition.      Mark C. Ophelia Charter, M.D.  Electronically Signed     MCY/MEDQ  D:  04/27/2006  T:  04/28/2006  Job:  [id number]

## 2010-09-17 ENCOUNTER — Other Ambulatory Visit (INDEPENDENT_AMBULATORY_CARE_PROVIDER_SITE_OTHER): Payer: 59 | Admitting: Internal Medicine

## 2010-09-17 ENCOUNTER — Other Ambulatory Visit (INDEPENDENT_AMBULATORY_CARE_PROVIDER_SITE_OTHER): Payer: 59

## 2010-09-17 ENCOUNTER — Other Ambulatory Visit: Payer: Self-pay | Admitting: Internal Medicine

## 2010-09-17 DIAGNOSIS — Z0000 Encounter for general adult medical examination without abnormal findings: Secondary | ICD-10-CM

## 2010-09-17 LAB — CBC WITH DIFFERENTIAL/PLATELET
Eosinophils Relative: 0.1 % (ref 0.0–5.0)
Monocytes Relative: 9.1 % (ref 3.0–12.0)
Neutrophils Relative %: 50.9 % (ref 43.0–77.0)
Platelets: 177 10*3/uL (ref 150.0–400.0)
WBC: 4.7 10*3/uL (ref 4.5–10.5)

## 2010-09-17 LAB — URINALYSIS, ROUTINE W REFLEX MICROSCOPIC
Nitrite: NEGATIVE
Total Protein, Urine: NEGATIVE
pH: 6 (ref 5.0–8.0)

## 2010-09-17 LAB — BASIC METABOLIC PANEL
Calcium: 9 mg/dL (ref 8.4–10.5)
Creatinine, Ser: 0.7 mg/dL (ref 0.4–1.2)
GFR: 97.97 mL/min (ref >60.00)
Glucose, Bld: 98 mg/dL (ref 70–99)
Sodium: 135 mEq/L (ref 135–145)

## 2010-09-17 LAB — HEPATIC FUNCTION PANEL
ALT: 15 U/L (ref 0–35)
AST: 19 U/L (ref 0–37)
Alkaline Phosphatase: 92 U/L (ref 39–117)
Total Bilirubin: 0.4 mg/dL (ref 0.3–1.2)

## 2010-09-17 LAB — TSH: TSH: 0.87 u[IU]/mL (ref 0.35–5.50)

## 2010-09-17 LAB — LIPID PANEL: HDL: 64.3 mg/dL (ref >39.00)

## 2010-09-19 DIAGNOSIS — A09 Infectious gastroenteritis and colitis, unspecified: Secondary | ICD-10-CM

## 2010-09-20 ENCOUNTER — Encounter: Payer: Self-pay | Admitting: Internal Medicine

## 2010-09-23 ENCOUNTER — Ambulatory Visit (INDEPENDENT_AMBULATORY_CARE_PROVIDER_SITE_OTHER): Payer: 59 | Admitting: Internal Medicine

## 2010-09-23 ENCOUNTER — Encounter: Payer: Self-pay | Admitting: Internal Medicine

## 2010-09-23 VITALS — BP 118/72 | HR 68 | Temp 98.8°F | Ht 66.0 in | Wt 208.5 lb

## 2010-09-23 DIAGNOSIS — R221 Localized swelling, mass and lump, neck: Secondary | ICD-10-CM

## 2010-09-23 DIAGNOSIS — Z23 Encounter for immunization: Secondary | ICD-10-CM

## 2010-09-23 DIAGNOSIS — F329 Major depressive disorder, single episode, unspecified: Secondary | ICD-10-CM

## 2010-09-23 DIAGNOSIS — R220 Localized swelling, mass and lump, head: Secondary | ICD-10-CM

## 2010-09-23 DIAGNOSIS — Z0000 Encounter for general adult medical examination without abnormal findings: Secondary | ICD-10-CM

## 2010-09-23 MED ORDER — AMLODIPINE BESYLATE 10 MG PO TABS: 10.0000 mg | ORAL_TABLET | Freq: Every day | ORAL | Status: DC

## 2010-09-23 MED ORDER — SERTRALINE HCL 100 MG PO TABS: 50.0000 mg | ORAL_TABLET | Freq: Every day | ORAL | Status: DC

## 2010-09-23 MED ORDER — VARICELLA VIRUS VACCINE LIVE ~~LOC~~ INJ
0.5000 mL | INJECTION | Freq: Once | SUBCUTANEOUS | Status: AC
  Administered 2010-09-23: 0.5 mL via SUBCUTANEOUS

## 2010-09-23 NOTE — Assessment & Plan Note (Signed)
Overall doing well, age appropriate education and counseling updated, referrals for preventative services and immunizations addressed, dietary and smoking counseling addressed, most recent labs and ECG reviewed.  I have personally reviewed and have noted: 1) the patient's medical and social history 2) The pt's use of alcohol, tobacco, and illicit drugs 3) The patient's current medications and supplements 4) Functional ability including ADL's, fall risk, home safety risk, hearing and visual impairment 5) Diet and physical activities 6) Evidence for depression or mood disorder 7) The patient's height, weight, and BMI have been recorded in the chart I have made referrals, and provided counseling and education based on review of the above Also for shingles shot today

## 2010-09-23 NOTE — Progress Notes (Signed)
Subjective:    Patient ID: Beverly Ballard, female    DOB: 08-17-1945, 65 y.o.   MRN: [id number]  HPI Here for wellness and f/u;  Overall doing ok;  Pt denies CP, worsening SOB, DOE, wheezing, orthopnea, PND, worsening LE edema, palpitations, dizziness or syncope.  Pt denies neurological change such as new Headache, facial or extremity weakness.  Pt denies polydipsia, polyuria, or low sugar symptoms. Pt states overall good compliance with treatment and medications, good tolerability, and trying to follow lower cholesterol diet.  Pt denies worsening depressive symptoms, suicidal ideation or panic. No fever, wt loss, night sweats, loss of appetite, or other constitutional symptoms.  Pt states good ability with ADL's, low fall risk, home safety reviewed and adequate, no significant changes in hearing or vision, and occasionally active with exercise.  Does incidentally have a lump to the left side of face that, although relatively small, has seemed to increase in size in the past 3 wks.  No pain, drainage, fluctuance, fever and no prior hx of same.  Husband with cancer, and she is understandably concerned. 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 Past Medical History  Diagnosis Date  . ABNORMAL ELECTROCARDIOGRAM 06/22/2009  . ALLERGIC RHINITIS 05/27/2007  . ANXIETY 12/19/2009  . ASTHMATIC BRONCHITIS, ACUTE 11/01/2007  . CHEST PAIN 06/22/2009  . DEPRESSION 12/19/2009  . Essential hypertension, benign 06/26/2009  . GERD 05/27/2007  . HIP PAIN, LEFT 12/19/2009  . HYPERTENSION 07/03/2009  . HYPOTHYROIDISM 05/27/2007  . LOW BACK PAIN 05/27/2007  . SEIZURE DISORDER 05/27/2007  . WEIGHT GAIN 11/01/2007  . Swelling, mass, or lump on face 09/23/2010   Past Surgical History  Procedure Date  . Lumbar fusion   . Heel spurs     reports that she has quit smoking. She does not have any smokeless  tobacco history on file. Her alcohol and drug histories not on file. family history includes Arthritis in her mother. Allergies  Allergen Reactions  . Clarithromycin   . Doxycycline    Current Outpatient Prescriptions on File Prior to Visit  Medication Sig Dispense Refill  . aspirin 81 MG tablet Take 81 mg by mouth daily.        . Calcium Carbonate-Vitamin D (CALCIUM-VITAMIN D) 600-200 MG-UNIT CAPS Take by mouth 2 (two) times daily.        . carbamazepine (TEGRETOL) 200 MG tablet Take 200 mg by mouth 2 (two) times daily.        . cetirizine (ZYRTEC) 10 MG tablet Take 10 mg by mouth daily.        . Cyanocobalamin (VITAMIN B 12 PO) Take by mouth daily.        . lansoprazole (PREVACID) 15 MG capsule Take 15 mg by mouth daily.        Marland Kitchen levothyroxine (SYNTHROID, LEVOTHROID) 175 MCG tablet Take 175 mcg by mouth daily.        . Multiple Vitamin (MULTIVITAMIN) capsule Take 1 capsule by mouth daily.        . Omega-3 Fatty Acids (FISH OIL PO) Take by mouth daily.        Marland Kitchen DISCONTD: amLODipine (NORVASC) 10 MG tablet Take 10 mg by mouth daily.        Marland Kitchen DISCONTD: sertraline (ZOLOFT) 100 MG tablet Take 100 mg by mouth daily.        Marland Kitchen  DISCONTD: meloxicam (MOBIC) 15 MG tablet Take 15 mg by mouth daily.        Marland Kitchen DISCONTD: promethazine (PHENERGAN) 25 MG tablet Take 25 mg by mouth every 6 (six) hours as needed.        Marland Kitchen DISCONTD: traMADol (ULTRAM-ER) 100 MG 24 hr tablet Take 100 mg by mouth daily as needed.         No current facility-administered medications on file prior to visit.   Review of Systems Review of Systems  Constitutional: Negative for diaphoresis, activity change, appetite change and unexpected weight change.  HENT: Negative for hearing loss, ear pain, facial swelling, mouth sores and neck stiffness.   Eyes: Negative for pain, redness and visual disturbance.  Respiratory: Negative for shortness of breath and wheezing.   Cardiovascular: Negative for chest pain and palpitations.    Gastrointestinal: Negative for diarrhea, blood in stool, abdominal distention and rectal pain.  Genitourinary: Negative for hematuria, flank pain and decreased urine volume.  Musculoskeletal: Negative for myalgias and joint swelling.  Skin: Negative for color change and wound.  Neurological: Negative for syncope and numbness.  Hematological: Negative for adenopathy.  Psychiatric/Behavioral: Negative for hallucinations, self-injury, decreased concentration and agitation.      Objective:   Physical Exam BP 118/72  Pulse 68  Temp(Src) 98.8 F (37.1 C) (Oral)  Ht 5\' 6"  (1.676 m)  Wt 208 lb 8 oz (94.575 kg)  BMI 33.65 kg/m2  SpO2 97% Physical Exam  VS noted Constitutional: Pt is oriented to person, place, and time. Appears well-developed and well-nourished.  HENT:  Left parotid area with 1 cm firm/spongy mass subq, no erythema/skin change/tender/drainage or fluctuance Head: Normocephalic and atraumatic.  Right Ear: External ear normal.  Left Ear: External ear normal.  Nose: Nose normal.  Mouth/Throat: Oropharynx is clear and moist.  Eyes: Conjunctivae and EOM are normal. Pupils are equal, round, and reactive to light.  Neck: Normal range of motion. Neck supple. No JVD present. No tracheal deviation present.  Cardiovascular: Normal rate, regular rhythm, normal heart sounds and intact distal pulses.   Pulmonary/Chest: Effort normal and breath sounds normal.  Abdominal: Soft. Bowel sounds are normal. There is no tenderness.  Musculoskeletal: Normal range of motion. Exhibits no edema.  Lymphadenopathy:  Has no cervical adenopathy.  Neurological: Pt is alert and oriented to person, place, and time. Pt has normal reflexes. No cranial nerve deficit.  Skin: Skin is warm and dry. No rash noted.  Psychiatric:  Has  normal mood and affect. Behavior is normal.         Assessment & Plan:

## 2010-09-23 NOTE — Assessment & Plan Note (Signed)
I suspect ? Benign left parotid mass, nontender, but is enlarging and will need ENT referral

## 2010-09-23 NOTE — Patient Instructions (Addendum)
You had the shingles shot today Please follow lower cholesterol diet Your Norvasc was sent to [facility] Continue all other medications as before, except decrease the zoloft to 50 mg per day You will be contacted regarding the referral for: ENT for the area of the left face Please return in 1 year for your yearly visit, or sooner if needed, with Lab testing done 3-5 days before

## 2010-09-23 NOTE — Assessment & Plan Note (Signed)
Just retired, feels much less stressed, ok to decresae the zoloft to 50 mg;  Husband has met ca though, and I think she should stay on at least 50 mg for now

## 2010-12-10 ENCOUNTER — Telehealth: Payer: Self-pay

## 2010-12-10 ENCOUNTER — Encounter: Payer: Self-pay | Admitting: Internal Medicine

## 2010-12-10 ENCOUNTER — Ambulatory Visit (INDEPENDENT_AMBULATORY_CARE_PROVIDER_SITE_OTHER): Payer: 59 | Admitting: Internal Medicine

## 2010-12-10 DIAGNOSIS — I10 Essential (primary) hypertension: Secondary | ICD-10-CM

## 2010-12-10 DIAGNOSIS — R079 Chest pain, unspecified: Secondary | ICD-10-CM

## 2010-12-10 DIAGNOSIS — F329 Major depressive disorder, single episode, unspecified: Secondary | ICD-10-CM

## 2010-12-10 MED ORDER — SERTRALINE HCL 100 MG PO TABS: 100.0000 mg | ORAL_TABLET | Freq: Every day | ORAL | Status: DC

## 2010-12-10 MED ORDER — PANTOPRAZOLE SODIUM 40 MG PO TBEC: 40.0000 mg | DELAYED_RELEASE_TABLET | Freq: Every day | ORAL | Status: DC

## 2010-12-10 MED ORDER — ESOMEPRAZOLE MAGNESIUM 40 MG PO CPDR: 40.0000 mg | DELAYED_RELEASE_CAPSULE | Freq: Every day | ORAL | Status: DC

## 2010-12-10 NOTE — Telephone Encounter (Signed)
Ok to change to generic protonix

## 2010-12-10 NOTE — Assessment & Plan Note (Signed)
Due to GI disease  - for PPI, and consdier esophagram or GI eval for ongoin dysphagia

## 2010-12-10 NOTE — Telephone Encounter (Signed)
Insurance company will not cover nexium. Is it okay to change to protonix or zegrid.

## 2010-12-10 NOTE — Assessment & Plan Note (Signed)
stable overall by hx and exam, most recent data reviewed with pt, and pt to continue medical treatment as before  BP Readings from Last 3 Encounters:  12/10/10 130/80  09/23/10 118/72  06/25/10 132/72

## 2010-12-10 NOTE — Patient Instructions (Addendum)
Please stop the prevacid Start the nexium 40 mg per day Increase the zoloft generic to 100 mg per day Both the [facility] Continue all other medications as before Please call if you feel referral for counseling would help Please return about June 2013 with blood work done before, or sooner if needed

## 2010-12-10 NOTE — Progress Notes (Signed)
Subjective:    Patient ID: Beverly Ballard, female    DOB: 29-Apr-1945, 65 y.o.   MRN: [id number]  HPI  Here to c/o persistent reflux type chest discomfort, some improved with occas TUMS but still persistent, worse in the past few wks, and now assoc with mild dysphagia to solids;  Denies worsening  abd pain, n/v, bowel change or blood. Pt denies other chest pain, increased sob or doe, wheezing, orthopnea, PND, increased LE swelling, palpitations, dizziness or syncope.  Pt denies new neurological symptoms such as new headache, or facial or extremity weakness or numbness   Pt denies polydipsia, polyuria.  Denies worsening depressive symptoms, suicidal ideation, or panic, though has ongoing anxiety, and increased recently related to not getting along with daughter well recently.   Past Medical History  Diagnosis Date  . ABNORMAL ELECTROCARDIOGRAM 06/22/2009  . ALLERGIC RHINITIS 05/27/2007  . ANXIETY 12/19/2009  . ASTHMATIC BRONCHITIS, ACUTE 11/01/2007  . CHEST PAIN 06/22/2009  . DEPRESSION 12/19/2009  . Essential hypertension, benign 06/26/2009  . GERD 05/27/2007  . HIP PAIN, LEFT 12/19/2009  . HYPERTENSION 07/03/2009  . HYPOTHYROIDISM 05/27/2007  . LOW BACK PAIN 05/27/2007  . SEIZURE DISORDER 05/27/2007  . WEIGHT GAIN 11/01/2007  . Swelling, mass, or lump on face 09/23/2010   Past Surgical History  Procedure Date  . Lumbar fusion   . Heel spurs     reports that she has quit smoking. She does not have any smokeless tobacco history on file. Her alcohol and drug histories not on file. family history includes Arthritis in her mother. Allergies  Allergen Reactions  . Clarithromycin   . Doxycycline    Current Outpatient Prescriptions on File Prior to Visit  Medication Sig Dispense Refill  . amLODipine (NORVASC) 10 MG tablet Take 1 tablet (10 mg total) by mouth daily.  90 tablet  3  . aspirin 81 MG tablet Take 81 mg by mouth daily.        . Calcium Carbonate-Vitamin D (CALCIUM-VITAMIN D) 600-200 MG-UNIT CAPS  Take by mouth 2 (two) times daily.        . carbamazepine (TEGRETOL) 200 MG tablet Take 200 mg by mouth 2 (two) times daily.        . cetirizine (ZYRTEC) 10 MG tablet Take 10 mg by mouth daily.        . Cyanocobalamin (VITAMIN B 12 PO) Take by mouth daily.        Marland Kitchen levothyroxine (SYNTHROID, LEVOTHROID) 175 MCG tablet Take 175 mcg by mouth daily.        . Multiple Vitamin (MULTIVITAMIN) capsule Take 1 capsule by mouth daily.        . Omega-3 Fatty Acids (FISH OIL PO) Take by mouth daily.         Review of Systems Review of Systems  Constitutional: Negative for diaphoresis and unexpected weight change.  HENT: Negative for drooling and tinnitus.   Eyes: Negative for photophobia and visual disturbance.  Respiratory: Negative for choking and stridor.   Gastrointestinal: Negative for vomiting and blood in stool.  Genitourinary: Negative for hematuria and decreased urine volume.    Objective:   Physical Exam BP 130/80  Pulse 80  Temp(Src) 98.9 F (37.2 C) (Oral)  Ht 5\' 6"  (1.676 m)  Wt 211 lb 6 oz (95.879 kg)  BMI 34.12 kg/m2  SpO2 96% Physical Exam  VS noted Constitutional: Pt appears well-developed and well-nourished.  HENT: Head: Normocephalic.  Right Ear: External ear normal.  Left Ear: External ear  normal.  Eyes: Conjunctivae and EOM are normal. Pupils are equal, round, and reactive to light.  Neck: Normal range of motion. Neck supple.  Cardiovascular: Normal rate and regular rhythm.   Pulmonary/Chest: Effort normal and breath sounds normal.  Abd:  Soft, NT, non-distended, + BS Neurological: Pt is alert. No cranial nerve deficit.  Skin: Skin is warm. No erythema.  Psychiatric: Pt behavior is normal. Thought content normal. 1+ nervous, tearful today when discussing daughter relations       Assessment & Plan:

## 2010-12-10 NOTE — Assessment & Plan Note (Signed)
Mild worsening - to increae the zoloft to 100 mg,  to f/u any worsening symptoms or concerns, declines counseling

## 2010-12-11 ENCOUNTER — Other Ambulatory Visit: Payer: Self-pay

## 2010-12-11 NOTE — Telephone Encounter (Signed)
Patient cannot afford protonix either as insurance will not pay. She got OTC zegerid 20 mg at the pharmacy yesterday, she would like to know if she takes 2, 20 mg tablets is that going to be ok?

## 2010-12-11 NOTE — Telephone Encounter (Signed)
That should be ok -   Robin to adjust med list

## 2010-12-11 NOTE — Telephone Encounter (Signed)
Called patient informed MD said ok.

## 2010-12-15 ENCOUNTER — Emergency Department (HOSPITAL_COMMUNITY): Payer: 59

## 2010-12-15 ENCOUNTER — Emergency Department (HOSPITAL_COMMUNITY)
Admission: EM | Admit: 2010-12-15 | Discharge: 2010-12-15 | Disposition: A | Discharge status: Home / Self Care | Payer: 59 | Attending: Emergency Medicine | Admitting: Emergency Medicine

## 2010-12-15 DIAGNOSIS — G40909 Epilepsy, unspecified, not intractable, without status epilepticus: Secondary | ICD-10-CM | POA: Insufficient documentation

## 2010-12-15 DIAGNOSIS — I10 Essential (primary) hypertension: Secondary | ICD-10-CM | POA: Insufficient documentation

## 2010-12-15 DIAGNOSIS — R0609 Other forms of dyspnea: Secondary | ICD-10-CM | POA: Insufficient documentation

## 2010-12-15 DIAGNOSIS — Z79899 Other long term (current) drug therapy: Secondary | ICD-10-CM | POA: Insufficient documentation

## 2010-12-15 DIAGNOSIS — R079 Chest pain, unspecified: Secondary | ICD-10-CM | POA: Insufficient documentation

## 2010-12-15 DIAGNOSIS — R0602 Shortness of breath: Secondary | ICD-10-CM | POA: Insufficient documentation

## 2010-12-15 DIAGNOSIS — R110 Nausea: Secondary | ICD-10-CM | POA: Insufficient documentation

## 2010-12-15 DIAGNOSIS — F411 Generalized anxiety disorder: Secondary | ICD-10-CM | POA: Insufficient documentation

## 2010-12-15 DIAGNOSIS — R0989 Other specified symptoms and signs involving the circulatory and respiratory systems: Secondary | ICD-10-CM | POA: Insufficient documentation

## 2010-12-15 DIAGNOSIS — E039 Hypothyroidism, unspecified: Secondary | ICD-10-CM | POA: Insufficient documentation

## 2010-12-15 LAB — DIFFERENTIAL
Basophils Absolute: 0 10*3/uL (ref 0.0–0.1)
Basophils Relative: 0 % (ref 0–1)
Eosinophils Absolute: 0 10*3/uL (ref 0.0–0.7)
Eosinophils Relative: 0 % (ref 0–5)
Monocytes Absolute: 0.6 10*3/uL (ref 0.1–1.0)
Neutro Abs: 2.6 10*3/uL (ref 1.7–7.7)

## 2010-12-15 LAB — CBC
MCHC: 36.2 g/dL — ABNORMAL HIGH (ref 30.0–36.0)
Platelets: 193 10*3/uL (ref 150–400)
RDW: 11.9 % (ref 11.5–15.5)

## 2010-12-15 LAB — BASIC METABOLIC PANEL
BUN: 11 mg/dL (ref 6–23)
Creatinine, Ser: 0.54 mg/dL (ref 0.50–1.10)
GFR calc Af Amer: >60 mL/min (ref >60)
GFR calc non Af Amer: >60 mL/min (ref >60)
Glucose, Bld: 103 mg/dL — ABNORMAL HIGH (ref 70–99)

## 2010-12-15 LAB — PROTIME-INR
INR: 1.04 (ref 0.00–1.49)
Prothrombin Time: 13.8 seconds (ref 11.6–15.2)

## 2010-12-15 LAB — POCT I-STAT TROPONIN I: Troponin i, poc: 0 ng/mL (ref 0.00–0.08)

## 2010-12-15 MED ORDER — IOHEXOL 300 MG/ML  SOLN: 80.0000 mL | Freq: Once | INTRAMUSCULAR | Status: DC | PRN

## 2011-03-05 ENCOUNTER — Other Ambulatory Visit (HOSPITAL_COMMUNITY): Payer: Self-pay | Admitting: Gynecology

## 2011-03-05 DIAGNOSIS — Z1231 Encounter for screening mammogram for malignant neoplasm of breast: Secondary | ICD-10-CM

## 2011-04-04 ENCOUNTER — Ambulatory Visit (HOSPITAL_COMMUNITY)
Admission: RE | Admit: 2011-04-04 | Discharge: 2011-04-04 | Disposition: A | Discharge status: Home / Self Care | Payer: 59 | Source: Ambulatory Visit | Attending: Gynecology | Admitting: Gynecology

## 2011-04-04 DIAGNOSIS — Z1231 Encounter for screening mammogram for malignant neoplasm of breast: Secondary | ICD-10-CM | POA: Insufficient documentation

## 2011-07-04 ENCOUNTER — Other Ambulatory Visit: Payer: Self-pay

## 2011-07-04 MED ORDER — PANTOPRAZOLE SODIUM 40 MG PO TBEC: 40.0000 mg | DELAYED_RELEASE_TABLET | Freq: Every day | ORAL | Status: DC

## 2011-09-29 ENCOUNTER — Other Ambulatory Visit: Payer: Self-pay | Admitting: Gynecology

## 2011-09-29 DIAGNOSIS — N644 Mastodynia: Secondary | ICD-10-CM

## 2011-10-06 ENCOUNTER — Ambulatory Visit
Admission: RE | Admit: 2011-10-06 | Discharge: 2011-10-06 | Disposition: A | Discharge status: Home / Self Care | Payer: 59 | Source: Ambulatory Visit | Attending: Gynecology | Admitting: Gynecology

## 2011-10-06 DIAGNOSIS — N644 Mastodynia: Secondary | ICD-10-CM

## 2011-10-13 ENCOUNTER — Other Ambulatory Visit: Payer: Self-pay

## 2011-10-13 MED ORDER — AMLODIPINE BESYLATE 10 MG PO TABS: 10.0000 mg | ORAL_TABLET | Freq: Every day | ORAL | Status: DC

## 2011-11-10 ENCOUNTER — Ambulatory Visit (INDEPENDENT_AMBULATORY_CARE_PROVIDER_SITE_OTHER): Payer: 59 | Admitting: Internal Medicine

## 2011-11-10 ENCOUNTER — Telehealth: Payer: Self-pay | Admitting: Internal Medicine

## 2011-11-10 ENCOUNTER — Other Ambulatory Visit (INDEPENDENT_AMBULATORY_CARE_PROVIDER_SITE_OTHER): Payer: 59

## 2011-11-10 ENCOUNTER — Encounter: Payer: Self-pay | Admitting: Internal Medicine

## 2011-11-10 VITALS — BP 140/80 | HR 65 | Temp 97.2°F | Ht 66.0 in | Wt 220.5 lb

## 2011-11-10 DIAGNOSIS — R600 Localized edema: Secondary | ICD-10-CM

## 2011-11-10 DIAGNOSIS — Z0000 Encounter for general adult medical examination without abnormal findings: Secondary | ICD-10-CM

## 2011-11-10 DIAGNOSIS — R609 Edema, unspecified: Secondary | ICD-10-CM

## 2011-11-10 LAB — CBC WITH DIFFERENTIAL/PLATELET
Basophils Absolute: 0 10*3/uL (ref 0.0–0.1)
Basophils Relative: 0.4 % (ref 0.0–3.0)
Eosinophils Relative: 0 % (ref 0.0–5.0)
HCT: 39.3 % (ref 36.0–46.0)
Hemoglobin: 13.3 g/dL (ref 12.0–15.0)
Lymphocytes Relative: 40.5 % (ref 12.0–46.0)
Lymphs Abs: 2.1 10*3/uL (ref 0.7–4.0)
Monocytes Relative: 11.6 % (ref 3.0–12.0)
Neutro Abs: 2.5 10*3/uL (ref 1.4–7.7)
RBC: 4.11 Mil/uL (ref 3.87–5.11)
RDW: 13.1 % (ref 11.5–14.6)

## 2011-11-10 LAB — HEPATIC FUNCTION PANEL
Albumin: 3.9 g/dL (ref 3.5–5.2)
Alkaline Phosphatase: 87 U/L (ref 39–117)
Bilirubin, Direct: 0.1 mg/dL (ref 0.0–0.3)
Total Protein: 7.3 g/dL (ref 6.0–8.3)

## 2011-11-10 LAB — LIPID PANEL
Cholesterol: 220 mg/dL — ABNORMAL HIGH (ref 0–200)
HDL: 62.1 mg/dL (ref >39.00)
Total CHOL/HDL Ratio: 4
Triglycerides: 166 mg/dL — ABNORMAL HIGH (ref 0.0–149.0)

## 2011-11-10 LAB — BASIC METABOLIC PANEL
Calcium: 8.9 mg/dL (ref 8.4–10.5)
Creatinine, Ser: 0.7 mg/dL (ref 0.4–1.2)
GFR: 91.11 mL/min (ref >60.00)
Glucose, Bld: 110 mg/dL — ABNORMAL HIGH (ref 70–99)
Sodium: 135 mEq/L (ref 135–145)

## 2011-11-10 LAB — LDL CHOLESTEROL, DIRECT: Direct LDL: 116.1 mg/dL

## 2011-11-10 MED ORDER — HYDROCHLOROTHIAZIDE 12.5 MG PO CAPS: 12.5000 mg | ORAL_CAPSULE | Freq: Every day | ORAL | Status: DC

## 2011-11-10 NOTE — Telephone Encounter (Signed)
Caller: Carson/Patient; PCP: Oliver Barre; CB#: [phone];  Call regarding Edema in Feet that started 11/07/11.  L foot worse than right.  Started Sulfa drug for UTI 11/06/11.  Triaged Edema/Autraumatic and all emergent SX R/O.  Disp = needs to be seen in 4 hrs for eval as she has started her anbx 11/06/11 and edema started 11/07/11. Home care and call back inst given.  Appt made with Dr. Jonny Ruiz for 11/10/11 at 1400.

## 2011-11-10 NOTE — Assessment & Plan Note (Signed)
Ok to add hct 12.5 mg prn ,

## 2011-11-10 NOTE — Patient Instructions (Signed)
Take all new medications as prescribed Continue all other medications as before Please go to LAB in the Basement for the blood and/or urine tests to be done today You will be contacted by phone if any changes need to be made immediately.  Otherwise, you will receive a letter about your results with an explanation.  OK to cancel the August 2013 appt  Please return in 1 year for your yearly visit, or sooner if needed, with Lab testing done 3-5 days before

## 2011-11-10 NOTE — Progress Notes (Signed)
Subjective:    Patient ID: Beverly Ballard, female    DOB: Feb 24, 1946, 66 y.o.   MRN: [id number]  HPI  Here for wellness and f/u;  Overall doing ok;  Pt denies CP, worsening SOB, DOE, wheezing, orthopnea, PND,  palpitations, dizziness or syncope, except for mild worsening edema to knees over the weekend, drinking plenty of fluids, not sure about weight.  .  Pt denies neurological change such as new Headache, facial or extremity , but did discuss some numbness to big toe left foot with Dr Ophelia Charter who suggested prob relatedto prior back surgury, makes her balance not perfect., and more difficult to put on shoes. .  Pt denies polydipsia, polyuria, or low sugar symptoms. Pt states overall good compliance with treatment and medications, good tolerability, and trying to follow lower cholesterol diet.  Pt denies worsening depressive symptoms, suicidal ideation or panic. No fever, wt loss, night sweats, loss of appetite, or other constitutional symptoms.  Pt states good ability with ADL's, low fall risk, home safety reviewed and adequate, no significant changes in hearing or vision, and occasionally active with exercise.  Still with occasional incontinence of urine despite recent UTI, but minor to her.. Past Medical History  Diagnosis Date  . ABNORMAL ELECTROCARDIOGRAM 06/22/2009  . ALLERGIC RHINITIS 05/27/2007  . ANXIETY 12/19/2009  . ASTHMATIC BRONCHITIS, ACUTE 11/01/2007  . CHEST PAIN 06/22/2009  . DEPRESSION 12/19/2009  . Essential hypertension, benign 06/26/2009  . GERD 05/27/2007  . HIP PAIN, LEFT 12/19/2009  . HYPERTENSION 07/03/2009  . HYPOTHYROIDISM 05/27/2007  . LOW BACK PAIN 05/27/2007  . SEIZURE DISORDER 05/27/2007  . WEIGHT GAIN 11/01/2007  . Swelling, mass, or lump on face 09/23/2010   Past Surgical History  Procedure Date  . Lumbar fusion   . Heel spurs     reports that she has quit smoking. She does not have any smokeless tobacco history on file. Her alcohol and drug histories not on file. family history  includes Arthritis in her mother. Allergies  Allergen Reactions  . Clarithromycin   . Doxycycline    Current Outpatient Prescriptions on File Prior to Visit  Medication Sig Dispense Refill  . amLODipine (NORVASC) 10 MG tablet Take 1 tablet (10 mg total) by mouth daily.  90 tablet  0  . aspirin 81 MG tablet Take 81 mg by mouth daily.        . Calcium Carbonate-Vitamin D (CALCIUM-VITAMIN D) 600-200 MG-UNIT CAPS Take by mouth 2 (two) times daily.        . carbamazepine (TEGRETOL) 200 MG tablet Take 200 mg by mouth 2 (two) times daily.        . cetirizine (ZYRTEC) 10 MG tablet Take 10 mg by mouth daily.        . Cyanocobalamin (VITAMIN B 12 PO) Take by mouth daily.        Marland Kitchen levothyroxine (SYNTHROID, LEVOTHROID) 175 MCG tablet Take 175 mcg by mouth daily.        . Multiple Vitamin (MULTIVITAMIN) capsule Take 1 capsule by mouth daily.        . Omega-3 Fatty Acids (FISH OIL PO) Take by mouth daily.        Maxwell Caul Bicarbonate (ZEGERID) 20-1100 MG CAPS Take 1 capsule by mouth 2 (two) times daily. OTC       . pantoprazole (PROTONIX) 40 MG tablet Take 1 tablet (40 mg total) by mouth daily.  90 tablet  1  . sertraline (ZOLOFT) 100 MG tablet Take 1 tablet (100  mg total) by mouth daily.  90 tablet  3   Review of Systems Review of Systems  Constitutional: Negative for diaphoresis, activity change, appetite change and unexpected weight change.  HENT: Negative for hearing loss, ear pain, facial swelling, mouth sores and neck stiffness.   Eyes: Negative for pain, redness and visual disturbance.  Respiratory: Negative for shortness of breath and wheezing.   Cardiovascular: Negative for chest pain and palpitations.  Gastrointestinal: Negative for diarrhea, blood in stool, abdominal distention and rectal pain.  Genitourinary: Negative for hematuria, flank pain and decreased urine volume.  Musculoskeletal: Negative for myalgias and joint swelling.  Skin: Negative for color change and wound.    Neurological: Negative for syncope and numbness.  Hematological: Negative for adenopathy.  Psychiatric/Behavioral: Negative for hallucinations, self-injury, decreased concentration and agitation.      Objective:   Physical Exam BP 140/80  Pulse 65  Temp 97.2 F (36.2 C) (Oral)  Ht 5\' 6"  (1.676 m)  Wt 220 lb 8 oz (100.018 kg)  BMI 35.59 kg/m2  SpO2 96% Physical Exam  VS noted Constitutional: Pt is oriented to person, place, and time. Appears well-developed and well-nourished.  HENT:  Head: Normocephalic and atraumatic.  Right Ear: External ear normal.  Left Ear: External ear normal.  Nose: Nose normal.  Mouth/Throat: Oropharynx is clear and moist.  Eyes: Conjunctivae and EOM are normal. Pupils are equal, round, and reactive to light.  Neck: Normal range of motion. Neck supple. No JVD present. No tracheal deviation present.  Cardiovascular: Normal rate, regular rhythm, normal heart sounds and intact distal pulses.   Pulmonary/Chest: Effort normal and breath sounds normal.  Abdominal: Soft. Bowel sounds are normal. There is no tenderness.  Musculoskeletal: Normal range of motion. Exhibits edema trace to 1+ knees. .  Lymphadenopathy:  Has no cervical adenopathy.  Neurological: Pt is alert and oriented to person, place, and time. Pt has normal reflexes. No cranial nerve deficit.  Skin: Skin is warm and dry. No rash noted.  Psychiatric:  Has  normal mood and affect. Behavior is normal.     Assessment & Plan:

## 2011-11-10 NOTE — Assessment & Plan Note (Signed)

## 2011-11-11 ENCOUNTER — Encounter: Payer: Self-pay | Admitting: Internal Medicine

## 2011-11-11 LAB — URINALYSIS, ROUTINE W REFLEX MICROSCOPIC
Ketones, ur: NEGATIVE
Specific Gravity, Urine: 1.01 (ref 1.000–1.030)
Total Protein, Urine: NEGATIVE
Urine Glucose: NEGATIVE
Urobilinogen, UA: 0.2 (ref 0.0–1.0)
pH: 6 (ref 5.0–8.0)

## 2011-11-25 ENCOUNTER — Ambulatory Visit (INDEPENDENT_AMBULATORY_CARE_PROVIDER_SITE_OTHER): Payer: 59 | Admitting: Endocrinology

## 2011-11-25 ENCOUNTER — Encounter: Payer: Self-pay | Admitting: Endocrinology

## 2011-11-25 VITALS — BP 102/72 | HR 81 | Temp 97.4°F | Wt 215.0 lb

## 2011-11-25 DIAGNOSIS — J069 Acute upper respiratory infection, unspecified: Secondary | ICD-10-CM

## 2011-11-25 MED ORDER — CEFUROXIME AXETIL 250 MG PO TABS: 250.0000 mg | ORAL_TABLET | Freq: Two times a day (BID) | ORAL | Status: AC

## 2011-11-25 NOTE — Patient Instructions (Addendum)
i have sent a prescription to your pharmacy, for an antibiotic. I hope you feel better soon.  If you don't feel better by next week, please call back.   

## 2011-11-25 NOTE — Progress Notes (Signed)
Subjective:    Patient ID: Beverly Ballard, female    DOB: February 24, 1946, 66 y.o.   MRN: [id number]  HPI Pt states a few days of congestion in the nose, and assoc chills and headache. Past Medical History  Diagnosis Date  . ABNORMAL ELECTROCARDIOGRAM 06/22/2009  . ALLERGIC RHINITIS 05/27/2007  . ANXIETY 12/19/2009  . ASTHMATIC BRONCHITIS, ACUTE 11/01/2007  . CHEST PAIN 06/22/2009  . DEPRESSION 12/19/2009  . Essential hypertension, benign 06/26/2009  . GERD 05/27/2007  . HIP PAIN, LEFT 12/19/2009  . HYPERTENSION 07/03/2009  . HYPOTHYROIDISM 05/27/2007  . LOW BACK PAIN 05/27/2007  . SEIZURE DISORDER 05/27/2007  . WEIGHT GAIN 11/01/2007  . Swelling, mass, or lump on face 09/23/2010    Past Surgical History  Procedure Date  . Lumbar fusion   . Heel spurs     History   Social History  . Marital Status: Married    Spouse Name: N/A    Number of Children: N/A  . Years of Education: N/A   Occupational History  . Not on file.   Social History Main Topics  . Smoking status: Former Games developer  . Smokeless tobacco: Not on file   Comment: 15 years less than 1 ppd quit 26 years ago  . Alcohol Use: Not on file  . Drug Use: Not on file  . Sexually Active: Not on file   Other Topics Concern  . Not on file   Social History Narrative  . No narrative on file    Current Outpatient Prescriptions on File Prior to Visit  Medication Sig Dispense Refill  . amLODipine (NORVASC) 10 MG tablet Take 1 tablet (10 mg total) by mouth daily.  90 tablet  0  . aspirin 81 MG tablet Take 81 mg by mouth daily.        . Calcium Carbonate-Vitamin D (CALCIUM-VITAMIN D) 600-200 MG-UNIT CAPS Take by mouth 2 (two) times daily.        . carbamazepine (TEGRETOL) 200 MG tablet Take 200 mg by mouth 2 (two) times daily.        . cetirizine (ZYRTEC) 10 MG tablet Take 10 mg by mouth daily.        . Cyanocobalamin (VITAMIN B 12 PO) Take by mouth daily.        . hydrochlorothiazide (MICROZIDE) 12.5 MG capsule Take 1 capsule (12.5 mg  total) by mouth daily.  90 capsule  3  . levothyroxine (SYNTHROID, LEVOTHROID) 175 MCG tablet Take 175 mcg by mouth daily.        . Multiple Vitamin (MULTIVITAMIN) capsule Take 1 capsule by mouth daily.        . Omega-3 Fatty Acids (FISH OIL PO) Take by mouth daily.        Maxwell Caul Bicarbonate (ZEGERID) 20-1100 MG CAPS Take 1 capsule by mouth 2 (two) times daily. OTC       . pantoprazole (PROTONIX) 40 MG tablet Take 1 tablet (40 mg total) by mouth daily.  90 tablet  1  . sertraline (ZOLOFT) 100 MG tablet Take 1 tablet (100 mg total) by mouth daily.  90 tablet  3    Allergies  Allergen Reactions  . Clarithromycin   . Doxycycline     Family History  Problem Relation Age of Onset  . Arthritis Mother     BP 102/72  Pulse 81  Temp 97.4 F (36.3 C) (Oral)  Wt 215 lb (97.523 kg)  SpO2 97%  Review of Systems She has nausea, but no vomiting.  Objective:   Physical Exam VITAL SIGNS:  See vs page GENERAL: no distress head: no deformity eyes: no periorbital swelling, no proptosis external nose and ears are normal mouth: no lesion seen Left tm is red.  Right is normal NECK: There is no palpable thyroid enlargement.  No thyroid nodule is palpable.  No palpable lymphadenopathy at the anterior neck. LUNGS:  Clear to auscultation      Assessment & Plan:  URI, new

## 2011-12-02 ENCOUNTER — Encounter: Payer: 59 | Admitting: Internal Medicine

## 2011-12-30 ENCOUNTER — Telehealth: Payer: Self-pay | Admitting: Internal Medicine

## 2011-12-30 NOTE — Telephone Encounter (Signed)
Ok with me 

## 2011-12-30 NOTE — Telephone Encounter (Signed)
Pt is req to change pcps from Dr Jonny Ruiz at [facility]. Elam to Dr. Selena Batten at [facility] because the [facility] location is closer to pts home. Pls advise if ok.

## 2011-12-31 ENCOUNTER — Other Ambulatory Visit: Payer: Self-pay

## 2011-12-31 MED ORDER — AMLODIPINE BESYLATE 10 MG PO TABS: 10.0000 mg | ORAL_TABLET | Freq: Every day | ORAL | Status: DC

## 2012-01-09 NOTE — Telephone Encounter (Signed)
Fine with me. Thanks!  Kriste Basque

## 2012-01-12 ENCOUNTER — Ambulatory Visit (INDEPENDENT_AMBULATORY_CARE_PROVIDER_SITE_OTHER): Payer: 59 | Admitting: Family Medicine

## 2012-01-12 ENCOUNTER — Encounter: Payer: Self-pay | Admitting: Family Medicine

## 2012-01-12 VITALS — BP 112/78 | Temp 98.3°F | Ht 66.0 in | Wt 221.0 lb

## 2012-01-12 DIAGNOSIS — K219 Gastro-esophageal reflux disease without esophagitis: Secondary | ICD-10-CM

## 2012-01-12 DIAGNOSIS — E039 Hypothyroidism, unspecified: Secondary | ICD-10-CM

## 2012-01-12 DIAGNOSIS — R569 Unspecified convulsions: Secondary | ICD-10-CM

## 2012-01-12 DIAGNOSIS — F329 Major depressive disorder, single episode, unspecified: Secondary | ICD-10-CM

## 2012-01-12 DIAGNOSIS — I10 Essential (primary) hypertension: Secondary | ICD-10-CM

## 2012-01-12 DIAGNOSIS — F411 Generalized anxiety disorder: Secondary | ICD-10-CM

## 2012-01-12 NOTE — Progress Notes (Signed)
Chief Complaint  Patient presents with  . Establish Care    HPI: Beverly Ballard is here to establish care as she just moved to this area and her cobra is ending and her interim insurance before getting Medicare next year only covers 2 office visits per year. She has no complaints today.  On review of chart she was seen for a annual wellness exam in 10/2011 with lipids, cmp, cbc, tsh checked at the time which I reviewed with the following summary: mild dyslipidemia.  Per review of health maintenance documente din chart she is utd on tetanus, zostavax, colonscopy, mammogram and pap.  Flu: had this done last week  Thyroid: -followed by endocrine -feeling fine : no skin or cold/heat intol changes or palpitations or weight changes  Seizure Disorder: -followed by neurology -no seizures in 30 years -on tegretol long term  HTN/HLP: -takes amlodipine -has taken this for a long time -denies: CP, SOB, palpitaitons, HAs, vision changes, swelling -did have some swelling a few months ago and given HXT, but didn't take this medication and has not had any further swelling -does not exercise, diet so so  Depression: -takes sertraline -has cut back to 50 daily recently and doing well -denies anxiety or depression  GERD: -two PPIs on med list -report snot taking either of these and taking generic OTC PPI only with symptoms well controlled    ROS: See pertinent positives and negatives per HPI.  Past Medical History  Diagnosis Date  . ABNORMAL ELECTROCARDIOGRAM 06/22/2009  . ALLERGIC RHINITIS 05/27/2007  . ANXIETY 12/19/2009  . ASTHMATIC BRONCHITIS, ACUTE 11/01/2007  . CHEST PAIN 06/22/2009  . DEPRESSION 12/19/2009  . Essential hypertension, benign 06/26/2009  . GERD 05/27/2007  . HIP PAIN, LEFT 12/19/2009  . HYPERTENSION 07/03/2009  . HYPOTHYROIDISM 05/27/2007  . LOW BACK PAIN 05/27/2007  . SEIZURE DISORDER 05/27/2007  . WEIGHT GAIN 11/01/2007  . Swelling, mass, or lump on face 09/23/2010    Family History   Problem Relation Age of Onset  . Arthritis Mother     History   Social History  . Marital Status: Married    Spouse Name: N/A    Number of Children: N/A  . Years of Education: N/A   Social History Main Topics  . Smoking status: Former Games developer  . Smokeless tobacco: None   Comment: 15 years less than 1 ppd quit 26 years ago  . Alcohol Use: No  . Drug Use: None  . Sexually Active: None   Other Topics Concern  . None   Social History Narrative  . None    Current outpatient prescriptions:amLODipine (NORVASC) 10 MG tablet, Take 1 tablet (10 mg total) by mouth daily., Disp: 90 tablet, Rfl: 3;  aspirin 81 MG tablet, Take 81 mg by mouth daily.  , Disp: , Rfl: ;  Calcium Carbonate-Vitamin D (CALCIUM-VITAMIN D) 600-200 MG-UNIT CAPS, Take by mouth 2 (two) times daily.  , Disp: , Rfl: ;  carbamazepine (TEGRETOL) 200 MG tablet, Take 200 mg by mouth 2 (two) times daily.  , Disp: , Rfl:  Cyanocobalamin (VITAMIN B 12 PO), Take by mouth daily.  , Disp: , Rfl: ;  levothyroxine (SYNTHROID, LEVOTHROID) 175 MCG tablet, Take 175 mcg by mouth daily.  , Disp: , Rfl: ;  Multiple Vitamin (MULTIVITAMIN) capsule, Take 1 capsule by mouth daily.  , Disp: , Rfl: ;  Omega-3 Fatty Acids (FISH OIL PO), Take by mouth daily.  , Disp: , Rfl: ;  pantoprazole (PROTONIX) 40 MG tablet,  Take 1 tablet (40 mg total) by mouth daily., Disp: 90 tablet, Rfl: 1 sertraline (ZOLOFT) 100 MG tablet, Take 50 mg by mouth daily., Disp: , Rfl: ;  DISCONTD: sertraline (ZOLOFT) 100 MG tablet, Take 1 tablet (100 mg total) by mouth daily., Disp: 90 tablet, Rfl: 3;  cetirizine (ZYRTEC) 10 MG tablet, Take 10 mg by mouth daily.  , Disp: , Rfl:   EXAM:  Filed Vitals:   01/12/12 1118  BP: 112/78  Temp: 98.3 F (36.8 C)    Body mass index is 35.67 kg/(m^2).  GENERAL: vitals reviewed and listed below, alert, oriented, appears well hydrated and in no acute distress  HEENT: atraumatic, conjucntiva clear, no obvious abnormalities on  inspection of external nose and ears  NECK: no masses on inspection  LUNGS: clear to auscultation bilaterally, no wheezes, rales or rhonchi, good air movement  CV: HRRR, no peripheral edema  MS: moves all extremities without noticeable abnormality  PSYCH: pleasant and cooperative, no obvious depression or anxiety  ASSESSMENT AND PLAN:  Discussed the following assessment and plan:   -PMH, FH, SH, Meds and allergies reviewed and updated -offered flu done last week per pt - updated in chart -patient recs summarized in pt instructions -follow up chronic medical problems in 4-6 months -see pt recs below - lifestyle changes for dyslipidemia with recheck at follow up  1. HYPOTHYROIDISM   2. ANXIETY   3. DEPRESSION   4. Essential hypertension, benign   5. GERD   6. SEIZURE DISORDER     No orders of the defined types were placed in this encounter.    Patient Instructions  We recommend the following healthy lifestyle measures: - eat a healthy diet consisting of lots of vegetables, fruits, beans, nuts, seeds, healthy meats such as white chicken and fish and whole grains.  - avoid fried foods, fast food, processed foods, sodas, red meet and other fattening foods.  - get a least 150 minutes of aerobic exercise per week.      Thank you for enrolling in MyChart. Please follow the instructions below to securely access your online medical record. MyChart allows you to send messages to your doctor, view your test results, renew your prescriptions, schedule appointments, and more.  How Do I Sign Up? 1. In your Internet browser, go to [email or web address] 2. Click on the New  User? link in the Sign In box.  3. Enter your MyChart Access Code exactly as it appears below. You will not need to use this code after you have completed the sign-up process. If you do not sign up before the expiration date, you must request a new code. MyChart Access Code:  [email or web address] Expires: 02/11/2012 11:54 AM  4. Enter the last four digits of your Social Security Number (xxxx) and Date of Birth (mm/dd/yyyy) as indicated and click Next. You will be taken to the next sign-up page. 5. Create a MyChart ID. This will be your MyChart login ID and cannot be changed, so think of one that is secure and easy to remember. 6. Create a MyChart password. You can change your password at any time. 7. Enter your Password Reset Question and Answer and click Next. This can be used at a later time if you forget your password.  8. Select your communication preference, and if applicable enter your e-mail address. You will receive e-mail notification when new information is available in MyChart by choosing to receive e-mail notifications and filling in your e-mail. 9. Click Sign  In. You can now view your medical record.   Additional Information If you have questions, you can email [email or web address] or call [phone] to talk to our MyChart staff. Remember, MyChart is NOT to be used for urgent needs. For medical emergencies, dial 911.      Return to clinic immediately if symptoms worsen or persist or new concerns.  Return in about 5 months (around 06/13/2012).  Kriste Basque R.

## 2012-01-12 NOTE — Patient Instructions (Addendum)
We recommend the following healthy lifestyle measures: - eat a healthy diet consisting of lots of vegetables, fruits, beans, nuts, seeds, healthy meats such as white chicken and fish and whole grains.  - avoid fried foods, fast food, processed foods, sodas, red meet and other fattening foods.  - get a least 150 minutes of aerobic exercise per week.      Thank you for enrolling in MyChart. Please follow the instructions below to securely access your online medical record. MyChart allows you to send messages to your doctor, view your test results, renew your prescriptions, schedule appointments, and more.  How Do I Sign Up? 1. In your Internet browser, go to [email or web address] 2. Click on the New  User? link in the Sign In box.  3. Enter your MyChart Access Code exactly as it appears below. You will not need to use this code after you have completed the sign-up process. If you do not sign up before the expiration date, you must request a new code. MyChart Access Code: [email or web address] Expires: 02/11/2012 11:54 AM  4. Enter the last four digits of your Social Security Number (xxxx) and Date of Birth (mm/dd/yyyy) as indicated and click Next. You will be taken to the next sign-up page. 5. Create a MyChart ID. This will be your MyChart login ID and cannot be changed, so think of one that is secure and easy to remember. 6. Create a MyChart password. You can change your password at any time. 7. Enter your Password Reset Question and Answer and click Next. This can be used at a later time if you forget your password.  8. Select your communication preference, and if applicable enter your e-mail address. You will receive e-mail notification when new information is available in MyChart by choosing to receive e-mail notifications and filling in your e-mail. 9. Click Sign In. You can now view your medical record.   Additional Information If you have questions, you can email  [email or web address] or call [phone] to talk to our MyChart staff. Remember, MyChart is NOT to be used for urgent needs. For medical emergencies, dial 911.

## 2012-06-15 ENCOUNTER — Encounter: Payer: Self-pay | Admitting: Family Medicine

## 2012-06-15 DIAGNOSIS — M8580 Other specified disorders of bone density and structure, unspecified site: Secondary | ICD-10-CM

## 2012-06-15 DIAGNOSIS — R8789 Other abnormal findings in specimens from female genital organs: Secondary | ICD-10-CM | POA: Insufficient documentation

## 2012-06-15 DIAGNOSIS — F418 Other specified anxiety disorders: Secondary | ICD-10-CM

## 2012-06-15 DIAGNOSIS — R87618 Other abnormal cytological findings on specimens from cervix uteri: Secondary | ICD-10-CM

## 2012-06-15 NOTE — Progress Notes (Signed)
Received OV notes from Dr. Nicholas Lose - gyn. Physical with pap 09/2011 -neg cytology, + HPV with recs from GYN to repeat in 12 months Mammo 09/2011 bi-rads 1 DEXA 09/2008 with stable osteopenia per physician remarks with recs to repeat in 3 years

## 2012-07-13 ENCOUNTER — Ambulatory Visit: Payer: 59 | Admitting: Family Medicine

## 2012-07-27 ENCOUNTER — Ambulatory Visit (INDEPENDENT_AMBULATORY_CARE_PROVIDER_SITE_OTHER): Payer: BC Managed Care – PPO | Admitting: Family Medicine

## 2012-07-27 ENCOUNTER — Encounter: Payer: Self-pay | Admitting: Family Medicine

## 2012-07-27 VITALS — BP 138/72 | HR 94 | Temp 98.6°F | Wt 228.0 lb

## 2012-07-27 DIAGNOSIS — J309 Allergic rhinitis, unspecified: Secondary | ICD-10-CM

## 2012-07-27 DIAGNOSIS — E785 Hyperlipidemia, unspecified: Secondary | ICD-10-CM

## 2012-07-27 DIAGNOSIS — B977 Papillomavirus as the cause of diseases classified elsewhere: Secondary | ICD-10-CM

## 2012-07-27 DIAGNOSIS — R8789 Other abnormal findings in specimens from female genital organs: Secondary | ICD-10-CM

## 2012-07-27 DIAGNOSIS — I10 Essential (primary) hypertension: Secondary | ICD-10-CM

## 2012-07-27 DIAGNOSIS — K219 Gastro-esophageal reflux disease without esophagitis: Secondary | ICD-10-CM

## 2012-07-27 DIAGNOSIS — F341 Dysthymic disorder: Secondary | ICD-10-CM

## 2012-07-27 DIAGNOSIS — R87618 Other abnormal cytological findings on specimens from cervix uteri: Secondary | ICD-10-CM

## 2012-07-27 DIAGNOSIS — F418 Other specified anxiety disorders: Secondary | ICD-10-CM

## 2012-07-27 DIAGNOSIS — E039 Hypothyroidism, unspecified: Secondary | ICD-10-CM

## 2012-07-27 MED ORDER — SERTRALINE HCL 100 MG PO TABS: 100.0000 mg | ORAL_TABLET | Freq: Every day | ORAL | Status: DC

## 2012-07-27 NOTE — Progress Notes (Signed)
Chief Complaint  Patient presents with  . 6 month follow up    HPI:  Follow up:  Mild HLD/HTN: -take norvasc, advised to consider statin in the past -Denies: CP, SOB, palpitations, HA, swelling -diet and exercise: poor -last labs: 10/2011  Depression: On zoloft - on 50 mg mood is good usually but has had a few more down days then usually the last few weeks, irritable Denies: SI, HI -wonders if needs to go up on medication  GERD: On PPI Stable  Hypothyroid: -followed by endocrine  Pap 09/2011 from gyn with recs to repeat in 12 months for HPV +  Seizure disorder: -followed by neuro -on tegretol longterm -no seizures in many years  Allergies: -gets allergy shots weekly -sees allergiest   ROS: See pertinent positives and negatives per HPI.  Past Medical History  Diagnosis Date  . ABNORMAL ELECTROCARDIOGRAM 06/22/2009  . ALLERGIC RHINITIS 05/27/2007  . ANXIETY 12/19/2009  . ASTHMATIC BRONCHITIS, ACUTE 11/01/2007  . CHEST PAIN 06/22/2009  . DEPRESSION 12/19/2009  . Essential hypertension, benign 06/26/2009  . GERD 05/27/2007  . HIP PAIN, LEFT 12/19/2009  . HYPERTENSION 07/03/2009  . HYPOTHYROIDISM 05/27/2007  . LOW BACK PAIN 05/27/2007  . SEIZURE DISORDER 05/27/2007  . WEIGHT GAIN 11/01/2007  . Swelling, mass, or lump on face 09/23/2010    Family History  Problem Relation Age of Onset  . Arthritis Mother   . Heart murmur Other     History   Social History  . Marital Status: Married    Spouse Name: N/A    Number of Children: N/A  . Years of Education: N/A   Social History Main Topics  . Smoking status: Former Games developer  . Smokeless tobacco: None     Comment: 15 years less than 1 ppd quit 26 years ago  . Alcohol Use: No  . Drug Use: None  . Sexually Active: None   Other Topics Concern  . None   Social History Narrative  . None    Current outpatient prescriptions:amLODipine (NORVASC) 10 MG tablet, Take 1 tablet (10 mg total) by mouth daily., Disp: 90 tablet, Rfl:  3;  aspirin 81 MG tablet, Take 81 mg by mouth daily.  , Disp: , Rfl: ;  Calcium Carbonate-Vitamin D (CALCIUM-VITAMIN D) 600-200 MG-UNIT CAPS, Take by mouth 2 (two) times daily.  , Disp: , Rfl: ;  carbamazepine (TEGRETOL) 200 MG tablet, Take 200 mg by mouth 2 (two) times daily.  , Disp: , Rfl:  cetirizine (ZYRTEC) 10 MG tablet, Take 10 mg by mouth daily.  , Disp: , Rfl: ;  Cyanocobalamin (VITAMIN B 12 PO), Take by mouth daily.  , Disp: , Rfl: ;  levothyroxine (SYNTHROID, LEVOTHROID) 175 MCG tablet, Take 175 mcg by mouth daily.  , Disp: , Rfl: ;  Multiple Vitamin (MULTIVITAMIN) capsule, Take 1 capsule by mouth daily.  , Disp: , Rfl: ;  Omega-3 Fatty Acids (FISH OIL PO), Take by mouth daily.  , Disp: , Rfl:  pantoprazole (PROTONIX) 40 MG tablet, Take 1 tablet (40 mg total) by mouth daily., Disp: 90 tablet, Rfl: 1;  sertraline (ZOLOFT) 100 MG tablet, Take 1 tablet (100 mg total) by mouth daily., Disp: 90 tablet, Rfl: 3  EXAM:  Filed Vitals:   07/27/12 1028  BP: 138/72  Pulse: 94  Temp: 98.6 F (37 C)    Body mass index is 36.82 kg/(m^2).  GENERAL: vitals reviewed and listed above, alert, oriented, appears well hydrated and in no acute distress  HEENT:  atraumatic, conjunttiva clear, no obvious abnormalities on inspection of external nose and ears  NECK: no obvious masses on inspection  LUNGS: clear to auscultation bilaterally, no wheezes, rales or rhonchi, good air movement  CV: HRRR, no peripheral edema  MS: moves all extremities without noticeable abnormality  PSYCH: pleasant and cooperative, depressed mood  ASSESSMENT AND PLAN:  Discussed the following assessment and plan:  HYPERTENSION - Plan: Basic metabolic panel, Hemoglobin A1c  ALLERGIC RHINITIS  HYPOTHYROIDISM - Plan: TSH  GERD  Depression with anxiety - Plan: sertraline (ZOLOFT) 100 MG tablet  Pap smear abnormality of cervix/human papillomavirus (HPV) positive - 09/2011, followed by gyn was to repeat in 12  months  Hyperlipemia - Plan: Lipid Panel, Hemoglobin A1c   -increased zoloft, advised counseling (number given to call)and water aerobics -checking basic labs, she wants to return tomorrow for fasting labs -advised to see gyn for repeat pap - number given to call -follow up 4-6 months -Patient advised to return or notify a doctor immediately if symptoms worsen or persist or new concerns arise.  Patient Instructions  -please increase your zoloft to 100mg  daily  -please schedule counseling  -increase exercise - consider water aerobics  -schedule appointment with gynecologist for repeat pap smear in June  SCHEDULE LAB APPOINTMENT FOR TOMORROW -We have ordered labs or studies at this visit. It can take up to 1-2 weeks for results and processing. We will contact you with instructions IF your results are abnormal. Normal results will be released to your [facility]. If you have not heard from [place] or can not find your results in [facility] in 2 weeks please contact our office.  -follow up with me in 4-6 weeks or sooner if worsening mood or any concerns           KIM, HANNAH R.

## 2012-07-27 NOTE — Patient Instructions (Addendum)
-  please increase your zoloft to 100mg  daily  -please schedule counseling  -increase exercise - consider water aerobics  -schedule appointment with gynecologist for repeat pap smear in June  SCHEDULE LAB APPOINTMENT FOR TOMORROW -We have ordered labs or studies at this visit. It can take up to 1-2 weeks for results and processing. We will contact you with instructions IF your results are abnormal. Normal results will be released to your [facility]. If you have not heard from [place] or can not find your results in [facility] in 2 weeks please contact our office.  -follow up with me in 4-6 weeks or sooner if worsening mood or any concerns

## 2012-07-28 ENCOUNTER — Other Ambulatory Visit (INDEPENDENT_AMBULATORY_CARE_PROVIDER_SITE_OTHER): Payer: BC Managed Care – PPO

## 2012-07-28 DIAGNOSIS — I10 Essential (primary) hypertension: Secondary | ICD-10-CM

## 2012-07-28 DIAGNOSIS — E785 Hyperlipidemia, unspecified: Secondary | ICD-10-CM

## 2012-07-28 DIAGNOSIS — E039 Hypothyroidism, unspecified: Secondary | ICD-10-CM

## 2012-07-28 LAB — LIPID PANEL
HDL: 58.2 mg/dL (ref >39.00)
Triglycerides: 96 mg/dL (ref 0.0–149.0)

## 2012-07-28 LAB — BASIC METABOLIC PANEL
Calcium: 9 mg/dL (ref 8.4–10.5)
Creatinine, Ser: 0.7 mg/dL (ref 0.4–1.2)
GFR: 95.69 mL/min (ref >60.00)

## 2012-07-28 LAB — HEMOGLOBIN A1C: Hgb A1c MFr Bld: 5.7 % (ref 4.6–6.5)

## 2012-07-29 ENCOUNTER — Telehealth: Payer: Self-pay | Admitting: Family Medicine

## 2012-07-29 NOTE — Telephone Encounter (Signed)
Pt aware and verbalized understanding. Pt has follow-up appointment

## 2012-07-29 NOTE — Telephone Encounter (Signed)
Please let patient know,   -cholesterol is a bit high -diabetes screening lab is a little high (>5.6) indicating a risk for developing diabetes  The best treatment to hopefully reverse these findings and prevent adverse health outcomes is a healthy diet and regular exercise.  Schedule follow up in about 3-4 months if not already scheduled to do so.

## 2012-07-29 NOTE — Telephone Encounter (Signed)
Left a message for pt to return call 

## 2012-09-07 ENCOUNTER — Ambulatory Visit (INDEPENDENT_AMBULATORY_CARE_PROVIDER_SITE_OTHER): Payer: BC Managed Care – PPO | Admitting: Family Medicine

## 2012-09-07 ENCOUNTER — Encounter: Payer: Self-pay | Admitting: Family Medicine

## 2012-09-07 ENCOUNTER — Other Ambulatory Visit: Payer: Self-pay | Admitting: Internal Medicine

## 2012-09-07 VITALS — BP 118/80 | Temp 98.1°F | Wt 226.0 lb

## 2012-09-07 DIAGNOSIS — R7309 Other abnormal glucose: Secondary | ICD-10-CM

## 2012-09-07 DIAGNOSIS — F418 Other specified anxiety disorders: Secondary | ICD-10-CM

## 2012-09-07 DIAGNOSIS — F341 Dysthymic disorder: Secondary | ICD-10-CM

## 2012-09-07 DIAGNOSIS — R7303 Prediabetes: Secondary | ICD-10-CM

## 2012-09-07 DIAGNOSIS — E785 Hyperlipidemia, unspecified: Secondary | ICD-10-CM

## 2012-09-07 DIAGNOSIS — I10 Essential (primary) hypertension: Secondary | ICD-10-CM

## 2012-09-07 NOTE — Progress Notes (Signed)
Chief Complaint  Patient presents with  . 6 week follow up    HPI:  Follow up Depression: -increased zoloft last visit to 100mg  daily -patient now reports: doing much better, not having the moody days anymore -denies: depression, SI -exercise: starting PT for knee issues - then may do water aerobics -counseling: not doing this, she feel better  Abnormal pap: -is followed by gyn, supposed to have repeat pap in June - she is going to call and schedule this  HLD/Prediabetes: -per recent labs -advised lifestyle recs, she is looking into water aerobics -denies: CP, SOB, polyuria, polydipsia  Clumsy on feet: -since back surgery has some xchronic numbness in feet and trips and falls at times - told related to surgery and seeing ortho -she is looking into [employer] and silver wneakers -seeing ortho for this and has referral to PT and then will do water aerobics  ROS: See pertinent positives and negatives per HPI.  Past Medical History  Diagnosis Date  . ABNORMAL ELECTROCARDIOGRAM 06/22/2009  . ALLERGIC RHINITIS 05/27/2007  . ANXIETY 12/19/2009  . ASTHMATIC BRONCHITIS, ACUTE 11/01/2007  . CHEST PAIN 06/22/2009  . DEPRESSION 12/19/2009  . Essential hypertension, benign 06/26/2009  . GERD 05/27/2007  . HIP PAIN, LEFT 12/19/2009  . HYPERTENSION 07/03/2009  . HYPOTHYROIDISM 05/27/2007  . LOW BACK PAIN 05/27/2007  . SEIZURE DISORDER 05/27/2007  . WEIGHT GAIN 11/01/2007  . Swelling, mass, or lump on face 09/23/2010    Family History  Problem Relation Age of Onset  . Arthritis Mother   . Heart murmur Other     History   Social History  . Marital Status: Married    Spouse Name: N/A    Number of Children: N/A  . Years of Education: N/A   Social History Main Topics  . Smoking status: Former Games developer  . Smokeless tobacco: None     Comment: 15 years less than 1 ppd quit 26 years ago  . Alcohol Use: No  . Drug Use: None  . Sexually Active: None   Other Topics Concern  . None   Social History  Narrative  . None    Current outpatient prescriptions:amLODipine (NORVASC) 10 MG tablet, Take 1 tablet (10 mg total) by mouth daily., Disp: 90 tablet, Rfl: 3;  aspirin 81 MG tablet, Take 81 mg by mouth daily.  , Disp: , Rfl: ;  Calcium Carbonate-Vitamin D (CALCIUM-VITAMIN D) 600-200 MG-UNIT CAPS, Take by mouth 2 (two) times daily.  , Disp: , Rfl: ;  carbamazepine (TEGRETOL) 200 MG tablet, Take 200 mg by mouth 2 (two) times daily.  , Disp: , Rfl:  cetirizine (ZYRTEC) 10 MG tablet, Take 10 mg by mouth daily.  , Disp: , Rfl: ;  Cyanocobalamin (VITAMIN B 12 PO), Take by mouth daily.  , Disp: , Rfl: ;  levothyroxine (SYNTHROID, LEVOTHROID) 175 MCG tablet, Take 175 mcg by mouth daily.  , Disp: , Rfl: ;  Multiple Vitamin (MULTIVITAMIN) capsule, Take 1 capsule by mouth daily.  , Disp: , Rfl: ;  Omega-3 Fatty Acids (FISH OIL PO), Take by mouth daily.  , Disp: , Rfl:  pantoprazole (PROTONIX) 40 MG tablet, Take 1 tablet (40 mg total) by mouth daily., Disp: 90 tablet, Rfl: 1;  sertraline (ZOLOFT) 100 MG tablet, Take 1 tablet (100 mg total) by mouth daily., Disp: 90 tablet, Rfl: 3  EXAM:  Filed Vitals:   09/07/12 1003  BP: 118/80  Temp: 98.1 F (36.7 C)    Body mass index is  36.49 kg/(m^2).  GENERAL: vitals reviewed and listed above, alert, oriented, appears well hydrated and in no acute distress  HEENT: atraumatic, conjunttiva clear, no obvious abnormalities on inspection of external nose and ears  NECK: no obvious masses on inspection  LUNGS: clear to auscultation bilaterally, no wheezes, rales or rhonchi, good air movement  CV: HRRR, no peripheral edema  MS: moves all extremities without noticeable abnormality  PSYCH: pleasant and cooperative, no obvious depression or anxiety  ASSESSMENT AND PLAN:  Discussed the following assessment and plan:  Depression with anxiety  HYPERTENSION  Hyperlipemia  Prediabetes  -she will continue zoloft at current dose -will see physical therapist  and start water aerobics -she will continue current medications -follow up in 3-4 months -Patient advised to return or notify a doctor immediately if symptoms worsen or persist or new concerns arise.  There are no Patient Instructions on file for this visit.   Kriste Basque R.

## 2012-09-08 ENCOUNTER — Other Ambulatory Visit: Payer: Self-pay | Admitting: Family Medicine

## 2012-09-22 ENCOUNTER — Ambulatory Visit (INDEPENDENT_AMBULATORY_CARE_PROVIDER_SITE_OTHER): Payer: BC Managed Care – PPO | Admitting: Family Medicine

## 2012-09-22 ENCOUNTER — Encounter: Payer: Self-pay | Admitting: Family Medicine

## 2012-09-22 VITALS — BP 122/80 | HR 90 | Temp 99.3°F | Wt 226.0 lb

## 2012-09-22 DIAGNOSIS — J019 Acute sinusitis, unspecified: Secondary | ICD-10-CM

## 2012-09-22 MED ORDER — AMOXICILLIN-POT CLAVULANATE 875-125 MG PO TABS: 1.0000 | ORAL_TABLET | Freq: Two times a day (BID) | ORAL | Status: DC

## 2012-09-22 NOTE — Progress Notes (Signed)
  Subjective:    Patient ID: Beverly Ballard, female    DOB: 24-Nov-1945, 67 y.o.   MRN: [id number]  HPI Here for one week of sinus pressure, PND, ST, and dizziness. No cough. On Tylenol Sinus.    Review of Systems  Constitutional: Positive for fever.  HENT: Positive for congestion, postnasal drip and sinus pressure.   Eyes: Negative.   Respiratory: Negative.        Objective:   Physical Exam  Constitutional: She appears well-developed and well-nourished.  HENT:  Right Ear: External ear normal.  Left Ear: External ear normal.  Eyes: Conjunctivae are normal.  Pulmonary/Chest: Effort normal and breath sounds normal.  Lymphadenopathy:    She has no cervical adenopathy.          Assessment & Plan:  Recheck prn

## 2012-09-27 ENCOUNTER — Other Ambulatory Visit: Payer: Self-pay | Admitting: Family Medicine

## 2012-09-27 DIAGNOSIS — Z1231 Encounter for screening mammogram for malignant neoplasm of breast: Secondary | ICD-10-CM

## 2012-10-06 ENCOUNTER — Ambulatory Visit (HOSPITAL_COMMUNITY)
Admission: RE | Admit: 2012-10-06 | Discharge: 2012-10-06 | Disposition: A | Discharge status: Home / Self Care | Payer: BC Managed Care – PPO | Source: Ambulatory Visit | Attending: Family Medicine | Admitting: Family Medicine

## 2012-10-06 DIAGNOSIS — Z1231 Encounter for screening mammogram for malignant neoplasm of breast: Secondary | ICD-10-CM | POA: Insufficient documentation

## 2012-10-21 ENCOUNTER — Encounter: Payer: Self-pay | Admitting: Family Medicine

## 2012-10-21 ENCOUNTER — Ambulatory Visit (INDEPENDENT_AMBULATORY_CARE_PROVIDER_SITE_OTHER): Payer: BC Managed Care – PPO | Admitting: Family Medicine

## 2012-10-21 VITALS — BP 142/70 | Temp 98.2°F | Wt 224.0 lb

## 2012-10-21 DIAGNOSIS — J309 Allergic rhinitis, unspecified: Secondary | ICD-10-CM

## 2012-10-21 MED ORDER — FLUTICASONE PROPIONATE 50 MCG/ACT NA SUSP: 2.0000 | Freq: Every day | NASAL | Status: DC

## 2012-10-21 NOTE — Progress Notes (Signed)
Chief Complaint  Patient presents with  . Dizziness    sinus pain and pressure, nausea     HPI:  Follow up:  Sinus issues: -seen a few weeks ago for this and did amox which helped but did not resolve issues -symptoms: ear fullness and pressure, PND, cough occ, a lot of nasal congestion, lots of sneezing -rarely feels actually dizzy - just feels like ears are full, also chronic issues with balance related to back surgery - seeing ortho for this and getting PT which is helping -wonders is if allergies -goes to [facility] and [facility] been getting shots -reports told allergist and they said it is because of the [place] in [place] -takes zyrtec on and off -does not use a nasal steroid -allergic to cats and has two indoor cats   ROS: See pertinent positives and negatives per HPI.  Past Medical History  Diagnosis Date  . ABNORMAL ELECTROCARDIOGRAM 06/22/2009  . ALLERGIC RHINITIS 05/27/2007  . ANXIETY 12/19/2009  . ASTHMATIC BRONCHITIS, ACUTE 11/01/2007  . CHEST PAIN 06/22/2009  . DEPRESSION 12/19/2009  . Essential hypertension, benign 06/26/2009  . GERD 05/27/2007  . HIP PAIN, LEFT 12/19/2009  . HYPERTENSION 07/03/2009  . HYPOTHYROIDISM 05/27/2007  . LOW BACK PAIN 05/27/2007  . SEIZURE DISORDER 05/27/2007  . WEIGHT GAIN 11/01/2007  . Swelling, mass, or lump on face 09/23/2010    Family History  Problem Relation Age of Onset  . Arthritis Mother   . Heart murmur Other     History   Social History  . Marital Status: Married    Spouse Name: N/A    Number of Children: N/A  . Years of Education: N/A   Social History Main Topics  . Smoking status: Former Games developer  . Smokeless tobacco: Never Used     Comment: 15 years less than 1 ppd quit 26 years ago  . Alcohol Use: No  . Drug Use: No  . Sexually Active: None   Other Topics Concern  . None   Social History Narrative  . None    Current outpatient prescriptions:amLODipine (NORVASC) 10 MG tablet, TAKE 1 TABLET EVERY DAY, Disp: 90 tablet,  Rfl: 3;  aspirin 81 MG tablet, Take 81 mg by mouth daily.  , Disp: , Rfl: ;  Calcium Carbonate-Vitamin D (CALCIUM-VITAMIN D) 600-200 MG-UNIT CAPS, Take by mouth 2 (two) times daily.  , Disp: , Rfl: ;  carbamazepine (TEGRETOL) 200 MG tablet, Take 200 mg by mouth 2 (two) times daily.  , Disp: , Rfl:  cetirizine (ZYRTEC) 10 MG tablet, Take 10 mg by mouth daily.  , Disp: , Rfl: ;  Cyanocobalamin (VITAMIN B 12 PO), Take by mouth daily.  , Disp: , Rfl: ;  levothyroxine (SYNTHROID, LEVOTHROID) 175 MCG tablet, Take 175 mcg by mouth daily.  , Disp: , Rfl: ;  Multiple Vitamin (MULTIVITAMIN) capsule, Take 1 capsule by mouth daily.  , Disp: , Rfl: ;  Omega-3 Fatty Acids (FISH OIL PO), Take by mouth daily.  , Disp: , Rfl:  pantoprazole (PROTONIX) 40 MG tablet, Take 1 tablet (40 mg total) by mouth daily., Disp: 90 tablet, Rfl: 1;  sertraline (ZOLOFT) 100 MG tablet, Take 1 tablet (100 mg total) by mouth daily., Disp: 90 tablet, Rfl: 3;  fluticasone (FLONASE) 50 MCG/ACT nasal spray, Place 2 sprays into the nose daily., Disp: 16 g, Rfl: 6  EXAM:  Filed Vitals:   10/21/12 1345  BP: 142/70  Temp: 98.2 F (36.8 C)    Body mass  index is 36.17 kg/(m^2).  GENERAL: vitals reviewed and listed above, alert, oriented, appears well hydrated and in no acute distress  HEENT: atraumatic, conjunttiva clear, no obvious abnormalities on inspection of external nose and ears, normal appearance of ear canals and TMs, clear nasal congestion and pale boggy turbinates, mild post oropharyngeal erythema with PND and cobblestoning, no tonsillar edema or exudate, no sinus TTP  NECK: no obvious masses on inspection  LUNGS: clear to auscultation bilaterally, no wheezes, rales or rhonchi, good air movement  CV: HRRR, no peripheral edema  MS: moves all extremities without noticeable abnormality  PSYCH: pleasant and cooperative, no obvious depression or anxiety  ASSESSMENT AND PLAN:  Discussed the following assessment and  plan:  Allergic rhinitis - Plan: fluticasone (FLONASE) 50 MCG/ACT nasal spray  -discussed options - she is frustrated with allergist -will do trial of INS and then add antihistamine if needed -advised she discuss concerns with her allergist -discussed cats - she is struggling with the idea of getting rid of them -follow up with allergist or with me as needed -Patient advised to return or notify a doctor immediately if symptoms worsen or persist or new concerns arise.  There are no Patient Instructions on file for this visit.   Kriste Basque R.

## 2013-01-12 ENCOUNTER — Encounter: Payer: Self-pay | Admitting: Family Medicine

## 2013-01-12 ENCOUNTER — Ambulatory Visit (INDEPENDENT_AMBULATORY_CARE_PROVIDER_SITE_OTHER): Payer: BC Managed Care – PPO | Admitting: Family Medicine

## 2013-01-12 VITALS — BP 142/80 | Temp 98.4°F | Wt 228.0 lb

## 2013-01-12 DIAGNOSIS — R7303 Prediabetes: Secondary | ICD-10-CM

## 2013-01-12 DIAGNOSIS — F418 Other specified anxiety disorders: Secondary | ICD-10-CM

## 2013-01-12 DIAGNOSIS — I10 Essential (primary) hypertension: Secondary | ICD-10-CM

## 2013-01-12 DIAGNOSIS — J309 Allergic rhinitis, unspecified: Secondary | ICD-10-CM

## 2013-01-12 DIAGNOSIS — R7309 Other abnormal glucose: Secondary | ICD-10-CM

## 2013-01-12 DIAGNOSIS — K219 Gastro-esophageal reflux disease without esophagitis: Secondary | ICD-10-CM

## 2013-01-12 DIAGNOSIS — E039 Hypothyroidism, unspecified: Secondary | ICD-10-CM

## 2013-01-12 DIAGNOSIS — F341 Dysthymic disorder: Secondary | ICD-10-CM

## 2013-01-12 LAB — BASIC METABOLIC PANEL
CO2: 31 mEq/L (ref 19–32)
Chloride: 101 mEq/L (ref 96–112)
Creatinine, Ser: 0.7 mg/dL (ref 0.4–1.2)
Glucose, Bld: 104 mg/dL — ABNORMAL HIGH (ref 70–99)
Potassium: 4.7 mEq/L (ref 3.5–5.1)

## 2013-01-12 LAB — LIPID PANEL
Cholesterol: 248 mg/dL — ABNORMAL HIGH (ref 0–200)
Total CHOL/HDL Ratio: 4
Triglycerides: 98 mg/dL (ref 0.0–149.0)
VLDL: 19.6 mg/dL (ref 0.0–40.0)

## 2013-01-12 LAB — LDL CHOLESTEROL, DIRECT: Direct LDL: 162.6 mg/dL

## 2013-01-12 LAB — HEMOGLOBIN A1C: Hgb A1c MFr Bld: 5.8 % (ref 4.6–6.5)

## 2013-01-12 NOTE — Patient Instructions (Signed)
-  We have ordered labs or studies at this visit. It can take up to 1-2 weeks for results and processing. We will contact you with instructions IF your results are abnormal. Normal results will be released to your [facility]. If you have not heard from [place] or can not find your results in [facility] in 2 weeks please contact our office.  Flonase daily  We recommend the following healthy lifestyle measures: - eat a healthy diet consisting of lots of vegetables, fruits, beans, nuts, seeds, healthy meats such as white chicken and fish and whole grains.  - avoid fried foods, fast food, processed foods, sodas, red meet and other fattening foods.  - get a least 150 minutes of aerobic exercise per week.   Follow up in: 4-6 months

## 2013-01-12 NOTE — Progress Notes (Signed)
Chief Complaint  Patient presents with  . 4 month follow up  . Otalgia    HPI:  Follow up: Note: getting ready to go on trip to [place] and home in [place] next month.  Otalgia: -started 2-3 days ago -does have chronic allergy issues -no fevers, chills, hearing loss, drainage, recently swimming  Depression/Anxiety: -on zoloft -reports: stable -denies depression, SI  HLD/HTN/Prediabetes: -was doing water aerobics, now doing PT -taking norvasc, ASA  Hypothyroid: -on , sees endocrine for this  GERD: -stable -takes nexium otc  Allergic rhinitis/chornic allergies: -followed by [facility] -allergic to cats but has 2 indoor cats -on antihistamine and ics last visit - doing shots, using oral antihistamine and nasal spray occ  ROS: See pertinent positives and negatives per HPI.  Past Medical History  Diagnosis Date  . ABNORMAL ELECTROCARDIOGRAM 06/22/2009  . ALLERGIC RHINITIS 05/27/2007  . ANXIETY 12/19/2009  . ASTHMATIC BRONCHITIS, ACUTE 11/01/2007  . CHEST PAIN 06/22/2009  . DEPRESSION 12/19/2009  . Essential hypertension, benign 06/26/2009  . GERD 05/27/2007  . HIP PAIN, LEFT 12/19/2009  . HYPERTENSION 07/03/2009  . HYPOTHYROIDISM 05/27/2007  . LOW BACK PAIN 05/27/2007  . SEIZURE DISORDER 05/27/2007  . WEIGHT GAIN 11/01/2007  . Swelling, mass, or lump on face 09/23/2010    Past Surgical History  Procedure Laterality Date  . Lumbar fusion    . Heel spurs      Family History  Problem Relation Age of Onset  . Arthritis Mother   . Heart murmur Other     History   Social History  . Marital Status: Married    Spouse Name: N/A    Number of Children: N/A  . Years of Education: N/A   Social History Main Topics  . Smoking status: Former Games developer  . Smokeless tobacco: Never Used     Comment: 15 years less than 1 ppd quit 26 years ago  . Alcohol Use: No  . Drug Use: No  . Sexual Activity: None   Other Topics Concern  . None   Social History Narrative  . None     Current outpatient prescriptions:amLODipine (NORVASC) 10 MG tablet, TAKE 1 TABLET EVERY DAY, Disp: 90 tablet, Rfl: 3;  aspirin 81 MG tablet, Take 81 mg by mouth daily.  , Disp: , Rfl: ;  Calcium Carbonate-Vitamin D (CALCIUM-VITAMIN D) 600-200 MG-UNIT CAPS, Take by mouth 2 (two) times daily.  , Disp: , Rfl: ;  carbamazepine (TEGRETOL) 200 MG tablet, Take 200 mg by mouth 2 (two) times daily.  , Disp: , Rfl:  cetirizine (ZYRTEC) 10 MG tablet, Take 10 mg by mouth daily.  , Disp: , Rfl: ;  Cyanocobalamin (VITAMIN B 12 PO), Take by mouth daily.  , Disp: , Rfl: ;  esomeprazole (NEXIUM) 20 MG capsule, Take 20 mg by mouth daily before breakfast., Disp: , Rfl: ;  fluticasone (FLONASE) 50 MCG/ACT nasal spray, Place 2 sprays into the nose daily., Disp: 16 g, Rfl: 6 levothyroxine (SYNTHROID, LEVOTHROID) 175 MCG tablet, Take 175 mcg by mouth daily.  , Disp: , Rfl: ;  Multiple Vitamin (MULTIVITAMIN) capsule, Take 1 capsule by mouth daily.  , Disp: , Rfl: ;  Omega-3 Fatty Acids (FISH OIL PO), Take by mouth daily.  , Disp: , Rfl: ;  sertraline (ZOLOFT) 100 MG tablet, Take 1 tablet (100 mg total) by mouth daily., Disp: 90 tablet, Rfl: 3  EXAM:  Filed Vitals:   01/12/13 0845  BP: 142/80  Temp: 98.4 F (36.9 C)  Body mass index is 36.82 kg/(m^2).  GENERAL: vitals reviewed and listed above, alert, oriented, appears well hydrated and in no acute distress  HEENT: atraumatic, conjunttiva clear, no obvious abnormalities on inspection of external nose and ears, normal appearance of ear canals and TMs, clear nasal congestion, mild post oropharyngeal erythema with PND, no tonsillar edema or exudate, no sinus TTP  NECK: no obvious masses on inspection  LUNGS: clear to auscultation bilaterally, no wheezes, rales or rhonchi, good air movement  CV: HRRR, no peripheral edema  MS: moves all extremities without noticeable abnormality  PSYCH: pleasant and cooperative, no obvious depression or anxiety  ASSESSMENT  AND PLAN:  Discussed the following assessment and plan:  HYPERTENSION - Plan: Basic metabolic panel, Lipid Panel  HYPOTHYROIDISM, s/p RAI, followed by Dr. Juleen China in endocrinology  ALLERGIC RHINITIS  GERD  Depression with anxiety  Prediabetes - Plan: Hemoglobin A1c  Allergic rhinitis  -continue current medications -lifestyle recs -FASTING LABS today -flu offered today - done today -ear issues likely related to allergies, advised dialy use of her flonase and let us know if persists in 2 weeks or if worsening -Patient advised to return or notify a doctor immediately if symptoms worsen or persist or new concerns arise.  Patient Instructions  -We have ordered labs or studies at this visit. It can take up to 1-2 weeks for results and processing. We will contact you with instructions IF your results are abnormal. Normal results will be released to your [facility]. If you have not heard from [place] or can not find your results in [facility] in 2 weeks please contact our office.  Flonase daily  We recommend the following healthy lifestyle measures: - eat a healthy diet consisting of lots of vegetables, fruits, beans, nuts, seeds, healthy meats such as white chicken and fish and whole grains.  - avoid fried foods, fast food, processed foods, sodas, red meet and other fattening foods.  - get a least 150 minutes of aerobic exercise per week.   Follow up in: 4-6 months      Beverly Ballard R.

## 2013-01-13 MED ORDER — PRAVASTATIN SODIUM 20 MG PO TABS: 20.0000 mg | ORAL_TABLET | Freq: Every day | ORAL | Status: DC

## 2013-01-13 NOTE — Addendum Note (Signed)
Addended by: Azucena Freed on: 01/13/2013 03:51 PM   Modules accepted: Orders

## 2013-01-13 NOTE — Progress Notes (Signed)
Quick Note:  Called and spoke with pt and pt is aware. Rx sent to pharmacy. ______ 

## 2013-02-11 ENCOUNTER — Encounter: Payer: Self-pay | Admitting: Family Medicine

## 2013-02-11 NOTE — Patient Instructions (Signed)
Received office notes from [facility].  Follow up in 6 to 12 months.  Sent to scan.

## 2013-04-08 ENCOUNTER — Ambulatory Visit (INDEPENDENT_AMBULATORY_CARE_PROVIDER_SITE_OTHER): Payer: Medicare Other | Admitting: Family Medicine

## 2013-04-08 VITALS — BP 118/70 | Temp 98.7°F | Wt 219.0 lb

## 2013-04-08 DIAGNOSIS — J309 Allergic rhinitis, unspecified: Secondary | ICD-10-CM

## 2013-04-08 DIAGNOSIS — F341 Dysthymic disorder: Secondary | ICD-10-CM

## 2013-04-08 DIAGNOSIS — I10 Essential (primary) hypertension: Secondary | ICD-10-CM

## 2013-04-08 DIAGNOSIS — E785 Hyperlipidemia, unspecified: Secondary | ICD-10-CM

## 2013-04-08 DIAGNOSIS — K219 Gastro-esophageal reflux disease without esophagitis: Secondary | ICD-10-CM

## 2013-04-08 DIAGNOSIS — E039 Hypothyroidism, unspecified: Secondary | ICD-10-CM

## 2013-04-08 DIAGNOSIS — R0982 Postnasal drip: Secondary | ICD-10-CM

## 2013-04-08 DIAGNOSIS — F418 Other specified anxiety disorders: Secondary | ICD-10-CM

## 2013-04-08 MED ORDER — PRAVASTATIN SODIUM 20 MG PO TABS: 20.0000 mg | ORAL_TABLET | Freq: Every day | ORAL | Status: DC

## 2013-04-08 NOTE — Progress Notes (Signed)
Pre visit review using our clinic review tool, if applicable. No additional management support is needed unless otherwise documented below in the visit note. 

## 2013-04-08 NOTE — Progress Notes (Signed)
Chief Complaint  Patient presents with  . 3 month follow up  . throat issue    problem breathing    HPI:  Follow up: Note: recently traveled to [place].Marland KitchenMarland KitchenEnjoyed herself.  Depression/Anxiety:  -on zoloft  -reports: stable - reports feels this is the dose of zoloft she needs -denies depression, SI   HLD/HTN/Prediabetes:  -working on diet and exercise - has cut out sweets and eating low carb -taking norvasc, ASA   Lab Results  Component Value Date   HGBA1C 5.8 01/12/2013   Lab Results  Component Value Date   CHOL 248* 01/12/2013   HDL 67.00 01/12/2013   LDLCALC 110* 09/17/2010   LDLDIRECT 162.6 01/12/2013   TRIG 98.0 01/12/2013   CHOLHDL 4 01/12/2013    GERD:  -stable  -takes nexium otc   Allergic rhinitis/chornic allergies:  -followed by [facility]  -allergic to cats but has 2 indoor cats  -on antihistamine and ics last visit - doing shots, using oral antihistamine and nasal spray occ -felt a gagging or globus sensation in throat last night and looked in throat and saw something in her throat -denies:SOB in lungs or wheezing, fevers or sinus pain  Hypothyroid: followed by Dr. Juleen China -on , sees endocrine for this   ROS: See pertinent positives and negatives per HPI.  Past Medical History  Diagnosis Date  . ABNORMAL ELECTROCARDIOGRAM 06/22/2009  . ALLERGIC RHINITIS 05/27/2007  . ANXIETY 12/19/2009  . ASTHMATIC BRONCHITIS, ACUTE 11/01/2007  . CHEST PAIN 06/22/2009  . DEPRESSION 12/19/2009  . Essential hypertension, benign 06/26/2009  . GERD 05/27/2007  . HIP PAIN, LEFT 12/19/2009  . HYPERTENSION 07/03/2009  . HYPOTHYROIDISM 05/27/2007  . LOW BACK PAIN 05/27/2007  . SEIZURE DISORDER 05/27/2007  . WEIGHT GAIN 11/01/2007  . Swelling, mass, or lump on face 09/23/2010    Past Surgical History  Procedure Laterality Date  . Lumbar fusion    . Heel spurs      Family History  Problem Relation Age of Onset  . Arthritis Mother   . Heart murmur Other     History    Social History  . Marital Status: Married    Spouse Name: N/A    Number of Children: N/A  . Years of Education: N/A   Social History Main Topics  . Smoking status: Former Games developer  . Smokeless tobacco: Never Used     Comment: 15 years less than 1 ppd quit 26 years ago  . Alcohol Use: No  . Drug Use: No  . Sexual Activity: Not on file   Other Topics Concern  . Not on file   Social History Narrative  . No narrative on file    Current outpatient prescriptions:amLODipine (NORVASC) 10 MG tablet, TAKE 1 TABLET EVERY DAY, Disp: 90 tablet, Rfl: 3;  aspirin 81 MG tablet, Take 81 mg by mouth daily.  , Disp: , Rfl: ;  Calcium Carbonate-Vitamin D (CALCIUM-VITAMIN D) 600-200 MG-UNIT CAPS, Take by mouth 2 (two) times daily.  , Disp: , Rfl: ;  carbamazepine (TEGRETOL) 200 MG tablet, Take 200 mg by mouth 2 (two) times daily.  , Disp: , Rfl:  cetirizine (ZYRTEC) 10 MG tablet, Take 10 mg by mouth daily.  , Disp: , Rfl: ;  Cyanocobalamin (VITAMIN B 12 PO), Take by mouth daily.  , Disp: , Rfl: ;  esomeprazole (NEXIUM) 20 MG capsule, Take 20 mg by mouth daily before breakfast., Disp: , Rfl: ;  fluticasone (FLONASE) 50 MCG/ACT nasal spray, Place 2 sprays into  the nose daily., Disp: 16 g, Rfl: 6 levothyroxine (SYNTHROID, LEVOTHROID) 175 MCG tablet, Take 175 mcg by mouth daily.  , Disp: , Rfl: ;  Multiple Vitamin (MULTIVITAMIN) capsule, Take 1 capsule by mouth daily.  , Disp: , Rfl: ;  Omega-3 Fatty Acids (FISH OIL PO), Take by mouth daily.  , Disp: , Rfl: ;  pravastatin (PRAVACHOL) 20 MG tablet, Take 1 tablet (20 mg total) by mouth daily., Disp: 90 tablet, Rfl: 0 sertraline (ZOLOFT) 100 MG tablet, Take 1 tablet (100 mg total) by mouth daily., Disp: 90 tablet, Rfl: 3  EXAM:  Filed Vitals:   04/08/13 0903  BP: 118/70  Temp: 98.7 F (37.1 C)    Body mass index is 35.36 kg/(m^2).  GENERAL: vitals reviewed and listed above, alert, oriented, appears well hydrated and in no acute distress  HEENT:  atraumatic, conjunttiva clear, no obvious abnormalities on inspection of external nose and ears, normal appearance of ear canals and TMs, clear nasal congestion, mild post oropharyngeal erythema with PND - drainage and glob of mucus in R post oropharynx, no tonsillar edema or exudate, no sinus TTP  NECK: no obvious masses on inspection  LUNGS: clear to auscultation bilaterally, no wheezes, rales or rhonchi, good air movement  CV: HRRR, no peripheral edema  MS: moves all extremities without noticeable abnormality  PSYCH: pleasant and cooperative, no obvious depression or anxiety  ASSESSMENT AND PLAN:  Discussed the following assessment and plan:  Depression with anxiety  HYPERTENSION  Other and unspecified hyperlipidemia - Plan: pravastatin (PRAVACHOL) 20 MG tablet  GERD  ALLERGIC RHINITIS  HYPOTHYROIDISM, s/p RAI, followed by Dr. Juleen China in endocrinology  PND (post-nasal drip)  -doing well -drainage in throat and advised humidifier and allergy treatments, follow up as needed -encourage and supported in lifestyle changes -labs next visit as has not been 90 days -Patient advised to return or notify a doctor immediately if symptoms worsen or persist or new concerns arise.  There are no Patient Instructions on file for this visit.   Kriste Basque R.

## 2013-04-15 ENCOUNTER — Encounter: Payer: Self-pay | Admitting: Family Medicine

## 2013-04-15 ENCOUNTER — Ambulatory Visit (INDEPENDENT_AMBULATORY_CARE_PROVIDER_SITE_OTHER): Payer: Medicare Other | Admitting: Family Medicine

## 2013-04-15 VITALS — BP 144/85 | HR 64 | Temp 98.2°F | Resp 18 | Ht 66.0 in | Wt 219.0 lb

## 2013-04-15 DIAGNOSIS — J069 Acute upper respiratory infection, unspecified: Secondary | ICD-10-CM

## 2013-04-15 NOTE — Progress Notes (Signed)
Pre visit review using our clinic review tool, if applicable. No additional management support is needed unless otherwise documented below in the visit note. OFFICE NOTE  04/15/2013  CC:  Chief Complaint  Patient presents with  . Cough    x 3 weeks     HPI: Patient is a 67 y.o. Caucasian female who is here for cough.   Onset 4-5d ago, lots of coughing, worse at night.  Slight ST.  No fever.  No body aches.  Mild malaise. A bit of cognac at bedtime didn't help cough.  NO otc meds have been tried.   Pertinent PMH:  Past Medical History  Diagnosis Date  . ABNORMAL ELECTROCARDIOGRAM 06/22/2009  . ALLERGIC RHINITIS 05/27/2007  . ANXIETY 12/19/2009  . ASTHMATIC BRONCHITIS, ACUTE 11/01/2007  . CHEST PAIN 06/22/2009  . DEPRESSION 12/19/2009  . Essential hypertension, benign 06/26/2009  . GERD 05/27/2007  . HIP PAIN, LEFT 12/19/2009  . HYPERTENSION 07/03/2009  . HYPOTHYROIDISM 05/27/2007  . LOW BACK PAIN 05/27/2007  . SEIZURE DISORDER 05/27/2007  . WEIGHT GAIN 11/01/2007  . Swelling, mass, or lump on face 09/23/2010   Past Surgical History  Procedure Laterality Date  . Lumbar fusion    . Heel spurs      MEDS:  Outpatient Prescriptions Prior to Visit  Medication Sig Dispense Refill  . amLODipine (NORVASC) 10 MG tablet TAKE 1 TABLET EVERY DAY  90 tablet  3  . aspirin 81 MG tablet Take 81 mg by mouth daily.        . Calcium Carbonate-Vitamin D (CALCIUM-VITAMIN D) 600-200 MG-UNIT CAPS Take by mouth 2 (two) times daily.        . carbamazepine (TEGRETOL) 200 MG tablet Take 200 mg by mouth 2 (two) times daily.        . cetirizine (ZYRTEC) 10 MG tablet Take 10 mg by mouth daily.        . Cyanocobalamin (VITAMIN B 12 PO) Take by mouth daily.        Marland Kitchen esomeprazole (NEXIUM) 20 MG capsule Take 20 mg by mouth daily before breakfast.      . fluticasone (FLONASE) 50 MCG/ACT nasal spray Place 2 sprays into the nose daily.  16 g  6  . levothyroxine (SYNTHROID, LEVOTHROID) 175 MCG tablet Take 175 mcg by mouth  daily.        . Multiple Vitamin (MULTIVITAMIN) capsule Take 1 capsule by mouth daily.        . Omega-3 Fatty Acids (FISH OIL PO) Take by mouth daily.        . pravastatin (PRAVACHOL) 20 MG tablet Take 1 tablet (20 mg total) by mouth daily.  90 tablet  0  . sertraline (ZOLOFT) 100 MG tablet Take 1 tablet (100 mg total) by mouth daily.  90 tablet  3   No facility-administered medications prior to visit.    PE: Blood pressure 144/85, pulse 64, temperature 98.2 F (36.8 C), temperature source Temporal, resp. rate 18, height 5\' 6"  (1.676 m), weight 219 lb (99.338 kg), SpO2 98.00%. VS: noted--normal. Gen: alert, NAD, NONTOXIC APPEARING. HEENT: eyes without injection, drainage, or swelling.  Ears: EACs clear, TMs with normal light reflex and landmarks.  Nose: Clear rhinorrhea, with some dried, crusty exudate adherent to mildly injected mucosa.  No purulent d/c.  No paranasal sinus TTP.  No facial swelling.  Throat and mouth without focal lesion.  No pharyngial swelling, erythema, or exudate.   Neck: supple, no LAD.   LUNGS: CTA bilat, nonlabored  resps.   CV: RRR, no m/r/g. EXT: no c/c/e SKIN: no rash  LAB: none  IMPRESSION AND PLAN:  Viral URI. Trial of mucinex DM or robitussin DM otc as directed on the box. May use OTC nasal saline spray or irrigation solution bid. OTC nonsedating antihistamines prn discussed.  Decongestant use discussed--ok if tolerated in the past w/out side effect and if pt has no hx of HTN.   FOLLOW UP: prn

## 2013-04-15 NOTE — Patient Instructions (Signed)
Try OTC generic robitussin DM as directed on package. Try OTC generic nasal saline spray several times per day.

## 2013-06-20 ENCOUNTER — Ambulatory Visit (INDEPENDENT_AMBULATORY_CARE_PROVIDER_SITE_OTHER): Payer: Medicare HMO | Admitting: Family Medicine

## 2013-06-20 ENCOUNTER — Encounter: Payer: Self-pay | Admitting: Family Medicine

## 2013-06-20 VITALS — BP 118/68 | Temp 99.0°F | Wt 219.0 lb

## 2013-06-20 DIAGNOSIS — E039 Hypothyroidism, unspecified: Secondary | ICD-10-CM

## 2013-06-20 DIAGNOSIS — R7303 Prediabetes: Secondary | ICD-10-CM

## 2013-06-20 DIAGNOSIS — E785 Hyperlipidemia, unspecified: Secondary | ICD-10-CM

## 2013-06-20 DIAGNOSIS — I10 Essential (primary) hypertension: Secondary | ICD-10-CM

## 2013-06-20 DIAGNOSIS — Z23 Encounter for immunization: Secondary | ICD-10-CM

## 2013-06-20 DIAGNOSIS — R7309 Other abnormal glucose: Secondary | ICD-10-CM

## 2013-06-20 NOTE — Progress Notes (Signed)
Chief Complaint  Patient presents with  . Follow-up    HPI:  Follow up:  Depression/Anxiety:  -on zoloft  -denies depression, SI   HLD/HTN/Prediabetes:  -working on diet and exercise - has cut out sweets and eating low carb, started silver sneakers -taking norvasc, ASA   Lab Results  Component Value Date   HGBA1C 5.8 01/12/2013   Lab Results  Component Value Date   CHOL 248* 01/12/2013   HDL 67.00 01/12/2013   LDLCALC 110* 09/17/2010   LDLDIRECT 162.6 01/12/2013   TRIG 98.0 01/12/2013   CHOLHDL 4 01/12/2013    GERD:  -stable  -takes nexium otc   Allergic rhinitis/chornic allergies:  -followed by [facility]  -allergic to cats but has 2 indoor cats   Hypothyroid: followed by Dr. Wilson Singer -on 121mcg synthroid, sees endocrine for this   ROS: See pertinent positives and negatives per HPI.  Past Medical History  Diagnosis Date  . ABNORMAL ELECTROCARDIOGRAM 06/22/2009  . ALLERGIC RHINITIS 05/27/2007  . ANXIETY 12/19/2009  . ASTHMATIC BRONCHITIS, ACUTE 11/01/2007  . CHEST PAIN 06/22/2009  . DEPRESSION 12/19/2009  . Essential hypertension, benign 06/26/2009  . GERD 05/27/2007  . HIP PAIN, LEFT 12/19/2009  . HYPERTENSION 07/03/2009  . HYPOTHYROIDISM 05/27/2007  . LOW BACK PAIN 05/27/2007  . SEIZURE DISORDER 05/27/2007  . WEIGHT GAIN 11/01/2007  . Swelling, mass, or lump on face 09/23/2010    Past Surgical History  Procedure Laterality Date  . Lumbar fusion    . Heel spurs      Family History  Problem Relation Age of Onset  . Arthritis Mother   . Heart murmur Other     History   Social History  . Marital Status: Married    Spouse Name: N/A    Number of Children: N/A  . Years of Education: N/A   Social History Main Topics  . Smoking status: Former Research scientist (life sciences)  . Smokeless tobacco: Never Used     Comment: 15 years less than 1 ppd quit 26 years ago  . Alcohol Use: No  . Drug Use: No  . Sexual Activity: None   Other Topics Concern  . None   Social History Narrative  .  None    Current outpatient prescriptions:amLODipine (NORVASC) 10 MG tablet, TAKE 1 TABLET EVERY DAY, Disp: 90 tablet, Rfl: 3;  aspirin 81 MG tablet, Take 81 mg by mouth daily.  , Disp: , Rfl: ;  Calcium Carbonate-Vitamin D (CALCIUM-VITAMIN D) 600-200 MG-UNIT CAPS, Take by mouth 2 (two) times daily.  , Disp: , Rfl: ;  carbamazepine (TEGRETOL) 200 MG tablet, Take 200 mg by mouth 2 (two) times daily.  , Disp: , Rfl:  cetirizine (ZYRTEC) 10 MG tablet, Take 10 mg by mouth daily.  , Disp: , Rfl: ;  Cyanocobalamin (VITAMIN B 12 PO), Take by mouth daily.  , Disp: , Rfl: ;  esomeprazole (NEXIUM) 20 MG capsule, Take 20 mg by mouth daily before breakfast., Disp: , Rfl: ;  fluticasone (FLONASE) 50 MCG/ACT nasal spray, Place 2 sprays into the nose daily., Disp: 16 g, Rfl: 6 levothyroxine (SYNTHROID, LEVOTHROID) 175 MCG tablet, Take 175 mcg by mouth daily.  , Disp: , Rfl: ;  Multiple Vitamin (MULTIVITAMIN) capsule, Take 1 capsule by mouth daily.  , Disp: , Rfl: ;  Omega-3 Fatty Acids (FISH OIL PO), Take by mouth daily.  , Disp: , Rfl: ;  pravastatin (PRAVACHOL) 20 MG tablet, Take 1 tablet (20 mg total) by mouth daily., Disp: 90 tablet, Rfl:  0 sertraline (ZOLOFT) 100 MG tablet, Take 1 tablet (100 mg total) by mouth daily., Disp: 90 tablet, Rfl: 3  EXAM:  Filed Vitals:   06/20/13 0953  BP: 118/68  Temp: 99 F (37.2 C)    Body mass index is 35.36 kg/(m^2).  GENERAL: vitals reviewed and listed above, alert, oriented, appears well hydrated and in no acute distress  HEENT: atraumatic, conjunttiva clear, no obvious abnormalities on inspection of external nose and ears  NECK: no obvious masses on inspection  LUNGS: clear to auscultation bilaterally, no wheezes, rales or rhonchi, good air movement  CV: HRRR, no peripheral edema  MS: moves all extremities without noticeable abnormality  PSYCH: pleasant and cooperative, no obvious depression or anxiety  ASSESSMENT AND PLAN:  Discussed the following  assessment and plan:  HYPERTENSION - Plan: Basic metabolic panel  HYPOTHYROIDISM, s/p RAI, followed by Dr. Wilson Singer in endocrinology  Hyperlipemia - Plan: Lipid Panel  Prediabetes - Plan: Hemoglobin A1c  Need for prophylactic vaccination against Streptococcus pneumoniae (pneumococcus) - Plan: Pneumococcal polysaccharide vaccine 23-valent greater than or equal to 2yo subcutaneous/IM, Pneumococcal polysaccharide vaccine 23-valent greater than or equal to 2yo subcutaneous/IM  -doing well -pneumonia vaccine offered -encourage and supported in lifestyle changes -labs - she will do fasting tomorrow -Patient advised to return or notify a doctor immediately if symptoms worsen or persist or new concerns arise.  Patient Instructions  -We have ordered labs or studies at this visit. It can take up to 1-2 weeks for results and processing. We will contact you with instructions IF your results are abnormal. Normal results will be released to your [facility] If you have not heard from [place] or can not find your results in [facility] in 2 weeks please contact our office.  -PLEASE SIGN UP FOR MYCHART TODAY   We recommend the following healthy lifestyle measures: - eat a healthy diet consisting of lots of vegetables, fruits, beans, nuts, seeds, healthy meats such as white chicken and fish and whole grains.  - avoid fried foods, fast food, processed foods, sodas, red meet and other fattening foods.  - get a least 150 minutes of aerobic exercise per week.   Follow up in: schedule lab appointment on your way out and follow up with me in 3-4 months      KIM, HANNAH R.

## 2013-06-20 NOTE — Patient Instructions (Signed)
-  We have ordered labs or studies at this visit. It can take up to 1-2 weeks for results and processing. We will contact you with instructions IF your results are abnormal. Normal results will be released to your [facility]. If you have not heard from [place] or can not find your results in [facility] in 2 weeks please contact our office.  -PLEASE SIGN UP FOR MYCHART TODAY   We recommend the following healthy lifestyle measures: - eat a healthy diet consisting of lots of vegetables, fruits, beans, nuts, seeds, healthy meats such as white chicken and fish and whole grains.  - avoid fried foods, fast food, processed foods, sodas, red meet and other fattening foods.  - get a least 150 minutes of aerobic exercise per week.   Follow up in: schedule lab appointment on your way out and follow up with me in 3-4 months

## 2013-06-20 NOTE — Progress Notes (Signed)
Pre visit review using our clinic review tool, if applicable. No additional management support is needed unless otherwise documented below in the visit note. 

## 2013-06-21 ENCOUNTER — Telehealth: Payer: Self-pay | Admitting: Family Medicine

## 2013-06-21 NOTE — Telephone Encounter (Signed)
Relevant patient education assigned to patient using Emmi. ° °

## 2013-06-23 ENCOUNTER — Other Ambulatory Visit (INDEPENDENT_AMBULATORY_CARE_PROVIDER_SITE_OTHER): Payer: Medicare HMO

## 2013-06-23 DIAGNOSIS — R7309 Other abnormal glucose: Secondary | ICD-10-CM

## 2013-06-23 DIAGNOSIS — R7303 Prediabetes: Secondary | ICD-10-CM

## 2013-06-23 DIAGNOSIS — E785 Hyperlipidemia, unspecified: Secondary | ICD-10-CM

## 2013-06-23 LAB — LIPID PANEL
CHOL/HDL RATIO: 3
Cholesterol: 191 mg/dL (ref 0–200)
HDL: 67.9 mg/dL (ref >39.00)
LDL Cholesterol: 110 mg/dL — ABNORMAL HIGH (ref 0–99)
TRIGLYCERIDES: 65 mg/dL (ref 0.0–149.0)
VLDL: 13 mg/dL (ref 0.0–40.0)

## 2013-06-23 LAB — HEMOGLOBIN A1C: Hgb A1c MFr Bld: 5.6 % (ref 4.6–6.5)

## 2013-07-07 ENCOUNTER — Other Ambulatory Visit: Payer: Self-pay | Admitting: Family Medicine

## 2013-07-23 ENCOUNTER — Other Ambulatory Visit: Payer: Self-pay | Admitting: Family Medicine

## 2013-07-28 ENCOUNTER — Encounter: Payer: Medicare HMO | Admitting: Family Medicine

## 2013-07-28 ENCOUNTER — Encounter: Payer: Self-pay | Admitting: Family Medicine

## 2013-07-28 NOTE — Progress Notes (Signed)
Pre visit review using our clinic review tool, if applicable. No additional management support is needed unless otherwise documented below in the visit note. 

## 2013-07-28 NOTE — Progress Notes (Signed)
appt scheduled in error  This encounter was created in error - please disregard.

## 2013-07-29 ENCOUNTER — Ambulatory Visit: Payer: Medicare Other | Admitting: Family Medicine

## 2013-08-29 ENCOUNTER — Encounter: Payer: Self-pay | Admitting: Family Medicine

## 2013-08-29 DIAGNOSIS — M25569 Pain in unspecified knee: Secondary | ICD-10-CM

## 2013-08-29 NOTE — Telephone Encounter (Signed)
Patient informed referral placed to orthopedist.

## 2013-08-29 NOTE — Telephone Encounter (Signed)
Referral placed.

## 2013-09-19 ENCOUNTER — Encounter: Payer: Self-pay | Admitting: Family Medicine

## 2013-09-19 ENCOUNTER — Ambulatory Visit (INDEPENDENT_AMBULATORY_CARE_PROVIDER_SITE_OTHER): Payer: Medicare HMO | Admitting: Family Medicine

## 2013-09-19 VITALS — BP 120/82 | HR 71 | Temp 98.0°F | Ht 66.0 in | Wt 217.0 lb

## 2013-09-19 DIAGNOSIS — F418 Other specified anxiety disorders: Secondary | ICD-10-CM

## 2013-09-19 DIAGNOSIS — R82998 Other abnormal findings in urine: Secondary | ICD-10-CM

## 2013-09-19 DIAGNOSIS — R8290 Unspecified abnormal findings in urine: Secondary | ICD-10-CM

## 2013-09-19 DIAGNOSIS — F341 Dysthymic disorder: Secondary | ICD-10-CM

## 2013-09-19 DIAGNOSIS — R300 Dysuria: Secondary | ICD-10-CM

## 2013-09-19 LAB — POCT URINALYSIS DIPSTICK
BILIRUBIN UA: NEGATIVE
GLUCOSE UA: NEGATIVE
Ketones, UA: NEGATIVE
NITRITE UA: POSITIVE
Protein, UA: NEGATIVE
SPEC GRAV UA: 1.015
Urobilinogen, UA: 0.2
pH, UA: 6

## 2013-09-19 MED ORDER — NITROFURANTOIN MONOHYD MACRO 100 MG PO CAPS: 100.0000 mg | ORAL_CAPSULE | Freq: Two times a day (BID) | ORAL | Status: DC

## 2013-09-19 NOTE — Progress Notes (Signed)
No chief complaint on file.   HPI:  Acute visit for:  Dysuria: -started last week -urine odor, frequency, urgency -denies: flank pain, fevers, nausea, vomiting, hematuria, malaise -hx of UTI several years ago  Stress: -husband going through prostate cancer treatment an grumpy -she denies SI, does not think needs change in treatment, feels like improving  ROS: See pertinent positives and negatives per HPI.  Past Medical History  Diagnosis Date  . ABNORMAL ELECTROCARDIOGRAM 06/22/2009  . ALLERGIC RHINITIS 05/27/2007  . ANXIETY 12/19/2009  . ASTHMATIC BRONCHITIS, ACUTE 11/01/2007  . CHEST PAIN 06/22/2009  . DEPRESSION 12/19/2009  . Essential hypertension, benign 06/26/2009  . GERD 05/27/2007  . HIP PAIN, LEFT 12/19/2009  . HYPERTENSION 07/03/2009  . HYPOTHYROIDISM 05/27/2007  . LOW BACK PAIN 05/27/2007  . SEIZURE DISORDER 05/27/2007  . WEIGHT GAIN 11/01/2007  . Swelling, mass, or lump on face 09/23/2010    Past Surgical History  Procedure Laterality Date  . Lumbar fusion    . Heel spurs      Family History  Problem Relation Age of Onset  . Arthritis Mother   . Heart murmur Other     History   Social History  . Marital Status: Married    Spouse Name: N/A    Number of Children: N/A  . Years of Education: N/A   Social History Main Topics  . Smoking status: Former Research scientist (life sciences)  . Smokeless tobacco: Never Used     Comment: 15 years less than 1 ppd quit 26 years ago  . Alcohol Use: No  . Drug Use: No  . Sexual Activity: None   Other Topics Concern  . None   Social History Narrative  . None    Current outpatient prescriptions:amLODipine (NORVASC) 10 MG tablet, TAKE 1 TABLET EVERY DAY, Disp: 90 tablet, Rfl: 3;  aspirin 81 MG tablet, Take 81 mg by mouth daily.  , Disp: , Rfl: ;  Calcium Carbonate-Vitamin D (CALCIUM-VITAMIN D) 600-200 MG-UNIT CAPS, Take by mouth 2 (two) times daily.  , Disp: , Rfl: ;  carbamazepine (TEGRETOL) 200 MG tablet, Take 200 mg by mouth 2 (two) times daily.  ,  Disp: , Rfl:  cetirizine (ZYRTEC) 10 MG tablet, Take 10 mg by mouth daily.  , Disp: , Rfl: ;  Cyanocobalamin (VITAMIN B 12 PO), Take by mouth daily.  , Disp: , Rfl: ;  esomeprazole (NEXIUM) 20 MG capsule, Take 20 mg by mouth daily before breakfast., Disp: , Rfl: ;  fluticasone (FLONASE) 50 MCG/ACT nasal spray, Place 2 sprays into the nose daily., Disp: 16 g, Rfl: 6 levothyroxine (SYNTHROID, LEVOTHROID) 175 MCG tablet, Take 175 mcg by mouth daily.  , Disp: , Rfl: ;  Multiple Vitamin (MULTIVITAMIN) capsule, Take 1 capsule by mouth daily.  , Disp: , Rfl: ;  Omega-3 Fatty Acids (FISH OIL PO), Take by mouth daily.  , Disp: , Rfl: ;  pravastatin (PRAVACHOL) 20 MG tablet, TAKE 1 TABLET BY MOUTH EVERY DAY, Disp: 90 tablet, Rfl: 3;  sertraline (ZOLOFT) 100 MG tablet, TAKE 1 TABLET EVERY DAY, Disp: 90 tablet, Rfl: 3 nitrofurantoin, macrocrystal-monohydrate, (MACROBID) 100 MG capsule, Take 1 capsule (100 mg total) by mouth 2 (two) times daily., Disp: 14 capsule, Rfl: 0  EXAM:  Filed Vitals:   09/19/13 0932  BP: 120/82  Pulse: 71  Temp: 98 F (36.7 C)    Body mass index is 35.04 kg/(m^2).  GENERAL: vitals reviewed and listed above, alert, oriented, appears well hydrated and in no acute distress  HEENT: atraumatic, conjunttiva clear, no obvious abnormalities on inspection of external nose and ears  NECK: no obvious masses on inspection  LUNGS: clear to auscultation bilaterally, no wheezes, rales or rhonchi, good air movement  CV: HRRR, no peripheral edema  ABD: BS+, soft, NTTP  MS: moves all extremities without noticeable abnormality  PSYCH: pleasant and cooperative, no obvious depression or anxiety  ASSESSMENT AND PLAN:  Discussed the following assessment and plan:  Abnormal urine odor - Plan: POC Urinalysis Dipstick, CULTURE, URINE COMPREHENSIVE, nitrofurantoin, macrocrystal-monohydrate, (MACROBID) 100 MG capsule  Dysuria - Plan: nitrofurantoin, macrocrystal-monohydrate, (MACROBID) 100 MG  capsule  Depression with anxiety  -UTI prevention discussed -udip c/w uti - tx with abx risks and return precautions discussed -sontinue current tx for depression and anxiety and let me know if worsening -Patient advised to return or notify a doctor immediately if symptoms worsen or persist or new concerns arise.  There are no Patient Instructions on file for this visit.   Lucretia Kern

## 2013-09-19 NOTE — Progress Notes (Signed)
Pre visit review using our clinic review tool, if applicable. No additional management support is needed unless otherwise documented below in the visit note. 

## 2013-09-22 LAB — CULTURE, URINE COMPREHENSIVE

## 2013-09-26 ENCOUNTER — Other Ambulatory Visit: Payer: Self-pay | Admitting: Family Medicine

## 2013-09-28 ENCOUNTER — Encounter: Payer: Self-pay | Admitting: Family Medicine

## 2013-09-28 MED ORDER — CIPROFLOXACIN HCL 500 MG PO TABS: 500.0000 mg | ORAL_TABLET | Freq: Two times a day (BID) | ORAL | Status: DC

## 2013-09-28 NOTE — Telephone Encounter (Signed)
Her culture grew pan sensitive E coli. Call in Cipro 500 mg bid for 7 days

## 2013-09-28 NOTE — Telephone Encounter (Signed)
I left a message at the pts home number to return my call and I sent a mychart message to the pt stating Dr Sarajane Jews recommended Cipro and this was called in to her pharmacy.

## 2013-09-28 NOTE — Telephone Encounter (Signed)
I called the pt and she complains of an odor to the urine, urinary pressure, vaginal itching x2-3 days, feels "clammy", tired and hot (temp not taken) and stated the Rx given (Macrobid) made her very nauseated and she was in bed for 2 days.  I advised her the message will be forwarded to Dr Sarajane Jews as Dr Maudie Mercury is out of the office.

## 2013-10-19 ENCOUNTER — Telehealth: Payer: Self-pay | Admitting: Family Medicine

## 2013-10-19 ENCOUNTER — Other Ambulatory Visit (HOSPITAL_COMMUNITY): Payer: Self-pay | Admitting: Orthopaedic Surgery

## 2013-10-19 NOTE — Telephone Encounter (Signed)
Pt returning your call. pls call °

## 2013-10-19 NOTE — Telephone Encounter (Signed)
I called the pt and advised her I do not recall leaving a message today for her to return my call and I did leave a message for her on 6/10 regarding results and that Cipro was sent to her pharmacy and she agreed.

## 2013-10-24 ENCOUNTER — Encounter: Payer: Self-pay | Admitting: *Deleted

## 2013-10-24 ENCOUNTER — Ambulatory Visit (INDEPENDENT_AMBULATORY_CARE_PROVIDER_SITE_OTHER): Payer: Medicare HMO | Admitting: Family Medicine

## 2013-10-24 ENCOUNTER — Encounter: Payer: Self-pay | Admitting: Family Medicine

## 2013-10-24 VITALS — BP 102/80 | HR 71 | Temp 98.5°F | Ht 66.0 in | Wt 219.5 lb

## 2013-10-24 DIAGNOSIS — R7303 Prediabetes: Secondary | ICD-10-CM

## 2013-10-24 DIAGNOSIS — E039 Hypothyroidism, unspecified: Secondary | ICD-10-CM

## 2013-10-24 DIAGNOSIS — I10 Essential (primary) hypertension: Secondary | ICD-10-CM

## 2013-10-24 DIAGNOSIS — F341 Dysthymic disorder: Secondary | ICD-10-CM

## 2013-10-24 DIAGNOSIS — E785 Hyperlipidemia, unspecified: Secondary | ICD-10-CM

## 2013-10-24 DIAGNOSIS — F418 Other specified anxiety disorders: Secondary | ICD-10-CM

## 2013-10-24 DIAGNOSIS — R7309 Other abnormal glucose: Secondary | ICD-10-CM

## 2013-10-24 DIAGNOSIS — K219 Gastro-esophageal reflux disease without esophagitis: Secondary | ICD-10-CM

## 2013-10-24 MED ORDER — AMLODIPINE BESYLATE 10 MG PO TABS: ORAL_TABLET | ORAL | Status: DC

## 2013-10-24 NOTE — Progress Notes (Signed)
No chief complaint on file.   HPI:  Follow up:  HPI:   Follow up:   Depression/Anxiety:  -on zoloft, stable -denies depression, SI   HLD/HTN/Prediabetes:  -working on diet and exercise - has cut out sweets and eating low carb, started silver sneakers  -taking norvasc, ASA  Lab Results  Component Value Date   CHOL 191 06/23/2013   HDL 67.90 06/23/2013   LDLCALC 110* 06/23/2013   LDLDIRECT 162.6 01/12/2013   TRIG 65.0 06/23/2013   CHOLHDL 3 06/23/2013   Lab Results  Component Value Date   HGBA1C 5.6 06/23/2013    GERD:  -stable  -takes nexium otc   Allergic rhinitis/chornic allergies:  -followed by [facility], stable -allergic to cats but has 2 indoor cats   Hypothyroid: followed by Dr. Wilson Singer  -on 175mcg synthroid, sees endocrine for this   Knee OA: -getting surgery for this, sees Dr. Lorin Mercy  ROS: See pertinent positives and negatives per HPI.  Past Medical History  Diagnosis Date  . ABNORMAL ELECTROCARDIOGRAM 06/22/2009  . ALLERGIC RHINITIS 05/27/2007  . ANXIETY 12/19/2009  . ASTHMATIC BRONCHITIS, ACUTE 11/01/2007  . CHEST PAIN 06/22/2009  . DEPRESSION 12/19/2009  . Essential hypertension, benign 06/26/2009  . GERD 05/27/2007  . HIP PAIN, LEFT 12/19/2009  . HYPERTENSION 07/03/2009  . HYPOTHYROIDISM 05/27/2007  . LOW BACK PAIN 05/27/2007  . SEIZURE DISORDER 05/27/2007  . WEIGHT GAIN 11/01/2007  . Swelling, mass, or lump on face 09/23/2010    Past Surgical History  Procedure Laterality Date  . Lumbar fusion    . Heel spurs      Family History  Problem Relation Age of Onset  . Arthritis Mother   . Heart murmur Other     History   Social History  . Marital Status: Married    Spouse Name: N/A    Number of Children: N/A  . Years of Education: N/A   Social History Main Topics  . Smoking status: Former Research scientist (life sciences)  . Smokeless tobacco: Never Used     Comment: 15 years less than 1 ppd quit 26 years ago  . Alcohol Use: No  . Drug Use: No  . Sexual Activity: None   Other  Topics Concern  . None   Social History Narrative  . None    Current outpatient prescriptions:amLODipine (NORVASC) 10 MG tablet, TAKE 1 TABLET EVERY DAY, Disp: 90 tablet, Rfl: 3;  aspirin 81 MG tablet, Take 81 mg by mouth daily.  , Disp: , Rfl: ;  Calcium Carbonate-Vitamin D (CALCIUM-VITAMIN D) 600-200 MG-UNIT CAPS, Take by mouth 2 (two) times daily.  , Disp: , Rfl: ;  carbamazepine (TEGRETOL) 200 MG tablet, Take 200 mg by mouth 2 (two) times daily.  , Disp: , Rfl:  Cyanocobalamin (VITAMIN B 12 PO), Take by mouth daily.  , Disp: , Rfl: ;  esomeprazole (NEXIUM) 20 MG capsule, Take 20 mg by mouth daily before breakfast., Disp: , Rfl: ;  fluticasone (FLONASE) 50 MCG/ACT nasal spray, Place 2 sprays into the nose daily., Disp: 16 g, Rfl: 6;  levothyroxine (SYNTHROID, LEVOTHROID) 175 MCG tablet, Take 175 mcg by mouth daily.  , Disp: , Rfl:  Multiple Vitamin (MULTIVITAMIN) capsule, Take 1 capsule by mouth daily.  , Disp: , Rfl: ;  Omega-3 Fatty Acids (FISH OIL PO), Take by mouth daily.  , Disp: , Rfl: ;  pravastatin (PRAVACHOL) 20 MG tablet, TAKE 1 TABLET BY MOUTH EVERY DAY, Disp: 90 tablet, Rfl: 3;  sertraline (ZOLOFT) 100 MG tablet,  TAKE 1 TABLET EVERY DAY, Disp: 90 tablet, Rfl: 3  EXAM:  Filed Vitals:   10/24/13 1522  BP: 102/80  Pulse: 71  Temp: 98.5 F (36.9 C)    Body mass index is 35.45 kg/(m^2).  GENERAL: vitals reviewed and listed above, alert, oriented, appears well hydrated and in no acute distress  HEENT: atraumatic, conjunttiva clear, no obvious abnormalities on inspection of external nose and ears  NECK: no obvious masses on inspection  LUNGS: clear to auscultation bilaterally, no wheezes, rales or rhonchi, good air movement  CV: HRRR, no peripheral edema  MS: moves all extremities without noticeable abnormality  PSYCH: pleasant and cooperative, no obvious depression or anxiety  ASSESSMENT AND PLAN:  Discussed the following assessment and plan:  HYPOTHYROIDISM, s/p  RAI, followed by Dr. Wilson Singer in endocrinology  HYPERTENSION - Plan: amLODipine (NORVASC) 10 MG tablet  Gastroesophageal reflux disease, esophagitis presence not specified  Depression with anxiety  Prediabetes  Other and unspecified hyperlipidemia  -needs physical exam - advised to schedule initial medicare exam -continue current medications -Patient advised to return or notify a doctor immediately if symptoms worsen or persist or new concerns arise.  Patient Instructions  -schedule your annual wellness exam       Lucretia Kern.

## 2013-10-24 NOTE — Patient Instructions (Signed)
-  schedule your annual wellness exam

## 2013-10-24 NOTE — Progress Notes (Signed)
Pre visit review using our clinic review tool, if applicable. No additional management support is needed unless otherwise documented below in the visit note. 

## 2013-11-01 ENCOUNTER — Encounter (HOSPITAL_COMMUNITY)
Admission: RE | Admit: 2013-11-01 | Discharge: 2013-11-01 | Disposition: A | Discharge status: Home / Self Care | Payer: Medicare HMO | Source: Ambulatory Visit | Attending: Orthopaedic Surgery | Admitting: Orthopaedic Surgery

## 2013-11-01 ENCOUNTER — Encounter (HOSPITAL_COMMUNITY): Payer: Self-pay

## 2013-11-01 ENCOUNTER — Ambulatory Visit (HOSPITAL_COMMUNITY)
Admission: RE | Admit: 2013-11-01 | Discharge: 2013-11-01 | Disposition: A | Discharge status: Home / Self Care | Payer: Medicare HMO | Source: Ambulatory Visit | Attending: Orthopaedic Surgery | Admitting: Orthopaedic Surgery

## 2013-11-01 DIAGNOSIS — M1710 Unilateral primary osteoarthritis, unspecified knee: Secondary | ICD-10-CM | POA: Insufficient documentation

## 2013-11-01 DIAGNOSIS — IMO0002 Reserved for concepts with insufficient information to code with codable children: Secondary | ICD-10-CM | POA: Insufficient documentation

## 2013-11-01 DIAGNOSIS — Z01818 Encounter for other preprocedural examination: Secondary | ICD-10-CM | POA: Insufficient documentation

## 2013-11-01 DIAGNOSIS — I517 Cardiomegaly: Secondary | ICD-10-CM | POA: Insufficient documentation

## 2013-11-01 HISTORY — DX: Unspecified osteoarthritis, unspecified site: M19.90

## 2013-11-01 HISTORY — DX: Diverticulosis of intestine, part unspecified, without perforation or abscess without bleeding: K57.90

## 2013-11-01 HISTORY — DX: Personal history of other diseases of the musculoskeletal system and connective tissue: Z87.39

## 2013-11-01 HISTORY — DX: Anesthesia of skin: R20.0

## 2013-11-01 HISTORY — DX: Personal history of other diseases of the nervous system and sense organs: Z86.69

## 2013-11-01 HISTORY — DX: Hyperlipidemia, unspecified: E78.5

## 2013-11-01 HISTORY — DX: Insomnia, unspecified: G47.00

## 2013-11-01 HISTORY — DX: Constipation, unspecified: K59.00

## 2013-11-01 LAB — COMPREHENSIVE METABOLIC PANEL
ALBUMIN: 3.9 g/dL (ref 3.5–5.2)
ALT: 13 U/L (ref 0–35)
AST: 19 U/L (ref 0–37)
Alkaline Phosphatase: 82 U/L (ref 39–117)
Anion gap: 13 (ref 5–15)
BUN: 13 mg/dL (ref 6–23)
CALCIUM: 9.5 mg/dL (ref 8.4–10.5)
CO2: 28 mEq/L (ref 19–32)
CREATININE: 0.64 mg/dL (ref 0.50–1.10)
Chloride: 98 mEq/L (ref 96–112)
GFR calc Af Amer: >90 mL/min (ref >90)
GFR calc non Af Amer: >90 mL/min (ref >90)
Glucose, Bld: 87 mg/dL (ref 70–99)
Potassium: 4.7 mEq/L (ref 3.7–5.3)
SODIUM: 139 meq/L (ref 137–147)
Total Bilirubin: 0.2 mg/dL — ABNORMAL LOW (ref 0.3–1.2)
Total Protein: 7.7 g/dL (ref 6.0–8.3)

## 2013-11-01 LAB — CBC
HCT: 41 % (ref 36.0–46.0)
Hemoglobin: 13.7 g/dL (ref 12.0–15.0)
MCH: 31.8 pg (ref 26.0–34.0)
MCHC: 33.4 g/dL (ref 30.0–36.0)
MCV: 95.1 fL (ref 78.0–100.0)
PLATELETS: 215 10*3/uL (ref 150–400)
RBC: 4.31 MIL/uL (ref 3.87–5.11)
RDW: 12.6 % (ref 11.5–15.5)
WBC: 6.1 10*3/uL (ref 4.0–10.5)

## 2013-11-01 LAB — URINALYSIS, ROUTINE W REFLEX MICROSCOPIC
Bilirubin Urine: NEGATIVE
GLUCOSE, UA: NEGATIVE mg/dL
Hgb urine dipstick: NEGATIVE
Ketones, ur: NEGATIVE mg/dL
Nitrite: NEGATIVE
PH: 5 (ref 5.0–8.0)
Protein, ur: NEGATIVE mg/dL
Specific Gravity, Urine: 1.019 (ref 1.005–1.030)
Urobilinogen, UA: 0.2 mg/dL (ref 0.0–1.0)

## 2013-11-01 LAB — TYPE AND SCREEN
ABO/RH(D): B NEG
ANTIBODY SCREEN: NEGATIVE

## 2013-11-01 LAB — SURGICAL PCR SCREEN
MRSA, PCR: NEGATIVE
Staphylococcus aureus: NEGATIVE

## 2013-11-01 LAB — PROTIME-INR
INR: 1.12 (ref 0.00–1.49)
Prothrombin Time: 14.4 seconds (ref 11.6–15.2)

## 2013-11-01 LAB — APTT: APTT: 27 s (ref 24–37)

## 2013-11-01 LAB — URINE MICROSCOPIC-ADD ON

## 2013-11-01 MED ORDER — CHLORHEXIDINE GLUCONATE 4 % EX LIQD: 60.0000 mL | Freq: Once | CUTANEOUS | Status: DC

## 2013-11-01 NOTE — Progress Notes (Signed)
Pt doesn't have a cardiologist   Stress test done 6+yrs ago  Denies ever having an echo or heart cath  Medical Md is with Dr.Hannah Maudie Mercury  Denies EKG or CXR in past yr

## 2013-11-01 NOTE — Pre-Procedure Instructions (Signed)
Beverly Ballard  11/01/2013   Your procedure is scheduled on:  Mon, July 20 @ 1:30 PM  Report to [facility]  at 11:30 AM.  Call this number if you have problems the morning of surgery: [phone]   Remember:   Do not eat food or drink liquids after midnight.   Take these medicines the morning of surgery with A SIP OF WATER: Amlodipine(Norvasc),Tegretol(Carbamazepine),Nexium(Esomeprazole),Flonase(Fluticasone),Synthroid(Levothyroxine),and Zoloft(Sertraline)              Stop taking your Aspirin and Fish Oil.  No Goody's,BC's,Aleve,Ibuprofen,or any Herbal Medications   Do not wear jewelry, make-up or nail polish.  Do not wear lotions, powders, or perfumes. You may wear deodorant.  Do not shave 48 hours prior to surgery.   Do not bring valuables to the hospital.  [facility] is not responsible                  for any belongings or valuables.               Contacts, dentures or bridgework may not be worn into surgery.  Leave suitcase in the car. After surgery it may be brought to your room.  For patients admitted to the hospital, discharge time is determined by your                treatment team.                  Special Instructions:  [facility] - Preparing for Surgery  Before surgery, you can play an important role.  Because skin is not sterile, your skin needs to be as free of germs as possible.  You can reduce the number of germs on you skin by washing with CHG (chlorahexidine gluconate) soap before surgery.  CHG is an antiseptic cleaner which kills germs and bonds with the skin to continue killing germs even after washing.  Please DO NOT use if you have an allergy to CHG or antibacterial soaps.  If your skin becomes reddened/irritated stop using the CHG and inform your nurse when you arrive at Short Stay.  Do not shave (including legs and underarms) for at least 48 hours prior to the first CHG shower.  You may shave your face.  Please follow these instructions  carefully:   1.  Shower with CHG Soap the night before surgery and the                                morning of Surgery.  2.  If you choose to wash your hair, wash your hair first as usual with your       normal shampoo.  3.  After you shampoo, rinse your hair and body thoroughly to remove the                      Shampoo.  4.  Use CHG as you would any other liquid soap.  You can apply chg directly       to the skin and wash gently with scrungie or a clean washcloth.  5.  Apply the CHG Soap to your body ONLY FROM THE NECK DOWN.        Do not use on open wounds or open sores.  Avoid contact with your eyes,       ears, mouth and genitals (private parts).  Wash genitals (private parts)  with your normal soap.  6.  Wash thoroughly, paying special attention to the area where your surgery        will be performed.  7.  Thoroughly rinse your body with warm water from the neck down.  8.  DO NOT shower/wash with your normal soap after using and rinsing off       the CHG Soap.  9.  Pat yourself dry with a clean towel.            10.  Wear clean pajamas.            11.  Place clean sheets on your bed the night of your first shower and do not        sleep with pets.  Day of Surgery  Do not apply any lotions/deoderants the morning of surgery.  Please wear clean clothes to the hospital/surgery center.     Please read over the following fact sheets that you were given: Pain Booklet, Coughing and Deep Breathing, Blood Transfusion Information, MRSA Information and Surgical Site Infection Prevention

## 2013-11-04 NOTE — H&P (Signed)
TOTAL KNEE ADMISSION H&P  Patient is being admitted for left total knee arthroplasty.  Subjective:  Chief Complaint:left knee pain.  HPI: Beverly Ballard, 68 y.o. female, has a history of pain and functional disability in the left knee due to arthritis and has failed non-surgical conservative treatments for greater than 12 weeks to includeNSAID's and/or analgesics, corticosteriod injections, viscosupplementation injections and use of assistive devices.  Onset of symptoms was gradual, starting 3 years ago with gradually worsening course since that time. The patient noted no past surgery on the right knee(s).  Patient currently rates pain in the right knee(s) at 7 out of 10 with activity. Patient has worsening of pain with activity and weight bearing and pain that interferes with activities of daily living.  Patient has evidence of subchondral sclerosis, periarticular osteophytes and joint space narrowing by imaging studies.  There is no active infection.  Patient Active Problem List   Diagnosis Date Noted  . Depression with anxiety 06/15/2012  . Osteopenia, stable on dexa 2010 - followed by gyn, was supposed to repeat dexa 2013 06/15/2012  . Pap smear abnormality of cervix/human papillomavirus (HPV) positive - 09/2011, followed by gyn was to repeat in 12 months 06/15/2012  . Peripheral edema 11/10/2011  . HYPERTENSION 07/03/2009  . HYPOTHYROIDISM, s/p RAI, followed by Dr. Wilson Singer in endocrinology 05/27/2007  . ALLERGIC RHINITIS 05/27/2007  . GERD 05/27/2007  . LOW BACK PAIN 05/27/2007  . SEIZURE DISORDER, followed by Dr. Doy Hutching in Neurology ([facility]) 05/27/2007   Past Medical History  Diagnosis Date  . ALLERGIC RHINITIS 05/27/2007    uses Flonase daily as needed and ALbuterol inhaler prn;gets Allergy shots  . ANXIETY 12/19/2009  . ASTHMATIC BRONCHITIS, ACUTE 52yrs ago  . LOW BACK PAIN 05/27/2007  . Essential hypertension, benign 06/26/2009    takes Amlodipine daily  . HYPERTENSION 07/03/2009   . Hyperlipidemia     takes Pravastatin daily  . DEPRESSION 12/19/2009    takes Zoloft daily  . History of migraine many yrs ago  . SEIZURE DISORDER 7 yrs ago was last one    takes Tegretol daily  . Numbness     left toes   . Arthritis   . Joint pain   . Joint swelling   . History of gout   . GERD 05/27/2007    takes Nexium daily  . Constipation   . Diverticulosis   . Hyperthyroidism     takes Synthroid daily  . Insomnia     but doesn't take any meds    Past Surgical History  Procedure Laterality Date  . Heel spurs    . Tonsillectomy    . Adenoidectomy    . Back surgery      x 1  . Colonoscopy    . Esophagogastroduodenoscopy      No prescriptions prior to admission   Allergies  Allergen Reactions  . Clarithromycin   . Doxycycline     History  Substance Use Topics  . Smoking status: Former Research scientist (life sciences)  . Smokeless tobacco: Never Used     Comment: quit smokikng 107yrs ago  . Alcohol Use: No    Family History  Problem Relation Age of Onset  . Arthritis Mother   . Heart murmur Other      Review of Systems  Musculoskeletal: Positive for joint pain.       Left knee  All other systems reviewed and are negative.   Objective:  Physical Exam  Constitutional: She is oriented to person, place, and  time. She appears well-developed and well-nourished.  HENT:  Head: Normocephalic and atraumatic.  Eyes: EOM are normal. Pupils are equal, round, and reactive to light.  Neck: Normal range of motion.  Cardiovascular: Normal rate, regular rhythm and intact distal pulses.   Respiratory: Effort normal and breath sounds normal.  GI: Soft.  Musculoskeletal: She exhibits no edema.  ROM left knee 5-110 degrees. Crepitus with ROM.  +3 effusion of left knee.  Neurological: She is alert and oriented to person, place, and time.  Skin: Skin is warm.    Vital signs in last 24 hours:    Labs:   Estimated body mass index is 35.04 kg/(m^2) as calculated from the following:    Height as of 09/19/13: 5\' 6"  (1.676 m).   Weight as of 09/19/13: 98.431 kg (217 lb).   Imaging Review Plain radiographs demonstrate severe degenerative joint disease of the left knee(s). The overall alignment ismild varus. The bone quality appears to be adequate for age and reported activity level.  Assessment/Plan:  End stage arthritis, left knee   The patient history, physical examination, clinical judgment of the provider and imaging studies are consistent with end stage degenerative joint disease of the left knee(s) and total knee arthroplasty is deemed medically necessary. The treatment options including medical management, injection therapy arthroscopy and arthroplasty were discussed at length. The risks and benefits of total knee arthroplasty were presented and reviewed. The risks due to aseptic loosening, infection, stiffness, patella tracking problems, thromboembolic complications and other imponderables were discussed. The patient acknowledged the explanation, agreed to proceed with the plan and consent was signed. Patient is being admitted for inpatient treatment for surgery, pain control, PT, OT, prophylactic antibiotics, VTE prophylaxis, progressive ambulation and ADL's and discharge planning. The patient is planning to be discharged home with home health services

## 2013-11-06 MED ORDER — CEFAZOLIN SODIUM-DEXTROSE 2-3 GM-% IV SOLR
2.0000 g | INTRAVENOUS | Status: AC
  Administered 2013-11-07: 2 g via INTRAVENOUS
  Filled 2013-11-06: qty 50

## 2013-11-07 ENCOUNTER — Inpatient Hospital Stay (HOSPITAL_COMMUNITY)
Admission: RE | Admit: 2013-11-07 | Discharge: 2013-11-11 | DRG: 470 | Disposition: A | Discharge status: Home Health | Payer: Medicare HMO | Source: Ambulatory Visit | Attending: Orthopaedic Surgery | Admitting: Orthopaedic Surgery

## 2013-11-07 ENCOUNTER — Encounter (HOSPITAL_COMMUNITY): Payer: Self-pay | Admitting: *Deleted

## 2013-11-07 ENCOUNTER — Encounter (HOSPITAL_COMMUNITY)
Admission: RE | Disposition: A | Discharge status: Home Health | Payer: Self-pay | Source: Ambulatory Visit | Attending: Orthopaedic Surgery

## 2013-11-07 ENCOUNTER — Inpatient Hospital Stay (HOSPITAL_COMMUNITY): Payer: Medicare HMO | Admitting: Certified Registered"

## 2013-11-07 ENCOUNTER — Encounter (HOSPITAL_COMMUNITY): Payer: Medicare HMO | Admitting: Certified Registered"

## 2013-11-07 DIAGNOSIS — M1710 Unilateral primary osteoarthritis, unspecified knee: Secondary | ICD-10-CM | POA: Diagnosis present

## 2013-11-07 DIAGNOSIS — M109 Gout, unspecified: Secondary | ICD-10-CM | POA: Diagnosis present

## 2013-11-07 DIAGNOSIS — R110 Nausea: Secondary | ICD-10-CM | POA: Diagnosis not present

## 2013-11-07 DIAGNOSIS — K5730 Diverticulosis of large intestine without perforation or abscess without bleeding: Secondary | ICD-10-CM | POA: Diagnosis present

## 2013-11-07 DIAGNOSIS — Z881 Allergy status to other antibiotic agents status: Secondary | ICD-10-CM | POA: Diagnosis not present

## 2013-11-07 DIAGNOSIS — F341 Dysthymic disorder: Secondary | ICD-10-CM | POA: Diagnosis present

## 2013-11-07 DIAGNOSIS — E890 Postprocedural hypothyroidism: Secondary | ICD-10-CM | POA: Diagnosis present

## 2013-11-07 DIAGNOSIS — D62 Acute posthemorrhagic anemia: Secondary | ICD-10-CM | POA: Diagnosis not present

## 2013-11-07 DIAGNOSIS — E785 Hyperlipidemia, unspecified: Secondary | ICD-10-CM | POA: Diagnosis present

## 2013-11-07 DIAGNOSIS — Z87891 Personal history of nicotine dependence: Secondary | ICD-10-CM | POA: Diagnosis not present

## 2013-11-07 DIAGNOSIS — G4700 Insomnia, unspecified: Secondary | ICD-10-CM | POA: Diagnosis present

## 2013-11-07 DIAGNOSIS — J309 Allergic rhinitis, unspecified: Secondary | ICD-10-CM | POA: Diagnosis present

## 2013-11-07 DIAGNOSIS — M25569 Pain in unspecified knee: Secondary | ICD-10-CM | POA: Diagnosis present

## 2013-11-07 DIAGNOSIS — E871 Hypo-osmolality and hyponatremia: Secondary | ICD-10-CM | POA: Diagnosis not present

## 2013-11-07 DIAGNOSIS — I10 Essential (primary) hypertension: Secondary | ICD-10-CM | POA: Diagnosis present

## 2013-11-07 DIAGNOSIS — K219 Gastro-esophageal reflux disease without esophagitis: Secondary | ICD-10-CM | POA: Diagnosis present

## 2013-11-07 DIAGNOSIS — G40909 Epilepsy, unspecified, not intractable, without status epilepticus: Secondary | ICD-10-CM | POA: Diagnosis present

## 2013-11-07 DIAGNOSIS — M1712 Unilateral primary osteoarthritis, left knee: Secondary | ICD-10-CM

## 2013-11-07 DIAGNOSIS — E875 Hyperkalemia: Secondary | ICD-10-CM | POA: Diagnosis present

## 2013-11-07 HISTORY — PX: KNEE ARTHROPLASTY: SHX992

## 2013-11-07 SURGERY — ARTHROPLASTY, KNEE, TOTAL, USING IMAGELESS COMPUTER-ASSISTED NAVIGATION
Anesthesia: General | Site: Knee | Laterality: Left

## 2013-11-07 MED ORDER — LIDOCAINE HCL (CARDIAC) 20 MG/ML IV SOLN
INTRAVENOUS | Status: DC | PRN
  Administered 2013-11-07 (×2): 100 mg via INTRAVENOUS

## 2013-11-07 MED ORDER — FENTANYL CITRATE 0.05 MG/ML IJ SOLN
INTRAMUSCULAR | Status: DC | PRN
  Administered 2013-11-07: 50 ug via INTRAVENOUS
  Administered 2013-11-07 (×3): 100 ug via INTRAVENOUS

## 2013-11-07 MED ORDER — ALBUTEROL SULFATE HFA 108 (90 BASE) MCG/ACT IN AERS: 1.0000 | INHALATION_SPRAY | Freq: Four times a day (QID) | RESPIRATORY_TRACT | Status: DC | PRN

## 2013-11-07 MED ORDER — LEVOTHYROXINE SODIUM 175 MCG PO TABS: 175.0000 ug | ORAL_TABLET | Freq: Every day | ORAL | Status: DC

## 2013-11-07 MED ORDER — ASPIRIN EC 325 MG PO TBEC
325.0000 mg | DELAYED_RELEASE_TABLET | Freq: Every day | ORAL | Status: DC
  Administered 2013-11-08 – 2013-11-11 (×4): 325 mg via ORAL
  Filled 2013-11-07 (×5): qty 1

## 2013-11-07 MED ORDER — BISACODYL 10 MG RE SUPP: 10.0000 mg | Freq: Every day | RECTAL | Status: DC | PRN

## 2013-11-07 MED ORDER — ONDANSETRON HCL 4 MG/2ML IJ SOLN: 4.0000 mg | Freq: Once | INTRAMUSCULAR | Status: DC | PRN

## 2013-11-07 MED ORDER — PANTOPRAZOLE SODIUM 40 MG PO TBEC
40.0000 mg | DELAYED_RELEASE_TABLET | Freq: Every day | ORAL | Status: DC
  Administered 2013-11-08 – 2013-11-11 (×4): 40 mg via ORAL
  Filled 2013-11-07 (×3): qty 1

## 2013-11-07 MED ORDER — METHOCARBAMOL 500 MG PO TABS
ORAL_TABLET | ORAL | Status: AC
  Filled 2013-11-07: qty 1

## 2013-11-07 MED ORDER — MORPHINE SULFATE 2 MG/ML IJ SOLN
INTRAMUSCULAR | Status: AC
  Filled 2013-11-07: qty 1

## 2013-11-07 MED ORDER — ACETAMINOPHEN 650 MG RE SUPP: 650.0000 mg | Freq: Four times a day (QID) | RECTAL | Status: DC | PRN

## 2013-11-07 MED ORDER — PROPOFOL 10 MG/ML IV BOLUS
INTRAVENOUS | Status: DC | PRN
  Administered 2013-11-07: 70 mg via INTRAVENOUS
  Administered 2013-11-07: 130 mg via INTRAVENOUS

## 2013-11-07 MED ORDER — FLEET ENEMA 7-19 GM/118ML RE ENEM: 1.0000 | ENEMA | Freq: Once | RECTAL | Status: AC | PRN

## 2013-11-07 MED ORDER — DOCUSATE SODIUM 100 MG PO CAPS
100.0000 mg | ORAL_CAPSULE | Freq: Two times a day (BID) | ORAL | Status: DC
  Administered 2013-11-07 – 2013-11-11 (×8): 100 mg via ORAL
  Filled 2013-11-07 (×9): qty 1

## 2013-11-07 MED ORDER — HYDROMORPHONE HCL PF 1 MG/ML IJ SOLN
0.2500 mg | INTRAMUSCULAR | Status: DC | PRN
  Administered 2013-11-07 (×4): 0.5 mg via INTRAVENOUS

## 2013-11-07 MED ORDER — ASPIRIN EC 325 MG PO TBEC: 325.0000 mg | DELAYED_RELEASE_TABLET | Freq: Every day | ORAL | Status: DC

## 2013-11-07 MED ORDER — OXYCODONE-ACETAMINOPHEN 5-325 MG PO TABS: 1.0000 | ORAL_TABLET | ORAL | Status: DC | PRN

## 2013-11-07 MED ORDER — ONDANSETRON HCL 4 MG/2ML IJ SOLN
4.0000 mg | Freq: Four times a day (QID) | INTRAMUSCULAR | Status: DC | PRN
  Administered 2013-11-08: 4 mg via INTRAVENOUS
  Filled 2013-11-07: qty 2

## 2013-11-07 MED ORDER — FENTANYL CITRATE 0.05 MG/ML IJ SOLN
INTRAMUSCULAR | Status: AC
  Filled 2013-11-07: qty 5

## 2013-11-07 MED ORDER — PROPOFOL 10 MG/ML IV BOLUS
INTRAVENOUS | Status: AC
  Filled 2013-11-07: qty 20

## 2013-11-07 MED ORDER — KETOROLAC TROMETHAMINE 15 MG/ML IJ SOLN
INTRAMUSCULAR | Status: AC
  Filled 2013-11-07: qty 1

## 2013-11-07 MED ORDER — HYDROMORPHONE HCL PF 1 MG/ML IJ SOLN
INTRAMUSCULAR | Status: AC
  Filled 2013-11-07: qty 1

## 2013-11-07 MED ORDER — ALBUTEROL SULFATE HFA 108 (90 BASE) MCG/ACT IN AERS
INHALATION_SPRAY | RESPIRATORY_TRACT | Status: DC | PRN
  Administered 2013-11-07: 2 via RESPIRATORY_TRACT

## 2013-11-07 MED ORDER — LIDOCAINE HCL (CARDIAC) 20 MG/ML IV SOLN
INTRAVENOUS | Status: AC
  Filled 2013-11-07: qty 5

## 2013-11-07 MED ORDER — ONDANSETRON HCL 4 MG/2ML IJ SOLN
INTRAMUSCULAR | Status: AC
  Filled 2013-11-07: qty 2

## 2013-11-07 MED ORDER — GLYCOPYRROLATE 0.2 MG/ML IJ SOLN
INTRAMUSCULAR | Status: DC | PRN
  Administered 2013-11-07: 0.2 mg via INTRAVENOUS

## 2013-11-07 MED ORDER — METOCLOPRAMIDE HCL 10 MG PO TABS: 5.0000 mg | ORAL_TABLET | Freq: Three times a day (TID) | ORAL | Status: DC | PRN

## 2013-11-07 MED ORDER — OXYCODONE HCL 5 MG PO TABS
5.0000 mg | ORAL_TABLET | ORAL | Status: DC | PRN
Start: 2013-11-07 — End: 2013-11-11
  Administered 2013-11-07 – 2013-11-09 (×7): 10 mg via ORAL
  Administered 2013-11-10 (×2): 5 mg via ORAL
  Administered 2013-11-10: 10 mg via ORAL
  Administered 2013-11-11: 5 mg via ORAL
  Filled 2013-11-07 (×2): qty 2
  Filled 2013-11-07 (×2): qty 1
  Filled 2013-11-07 (×7): qty 2

## 2013-11-07 MED ORDER — SERTRALINE HCL 100 MG PO TABS
100.0000 mg | ORAL_TABLET | Freq: Every day | ORAL | Status: DC
  Administered 2013-11-08 – 2013-11-11 (×4): 100 mg via ORAL
  Filled 2013-11-07 (×4): qty 1

## 2013-11-07 MED ORDER — METHOCARBAMOL 500 MG PO TABS
500.0000 mg | ORAL_TABLET | Freq: Four times a day (QID) | ORAL | Status: DC | PRN
  Administered 2013-11-07 – 2013-11-11 (×7): 500 mg via ORAL
  Filled 2013-11-07 (×8): qty 1

## 2013-11-07 MED ORDER — KCL IN DEXTROSE-NACL 20-5-0.45 MEQ/L-%-% IV SOLN
INTRAVENOUS | Status: DC
Start: 2013-11-07 — End: 2013-11-11
  Administered 2013-11-07: 75 mL/h via INTRAVENOUS
  Administered 2013-11-08: 08:00:00 via INTRAVENOUS
  Filled 2013-11-07 (×2): qty 1000

## 2013-11-07 MED ORDER — CARBAMAZEPINE 200 MG PO TABS
200.0000 mg | ORAL_TABLET | Freq: Two times a day (BID) | ORAL | Status: DC
  Administered 2013-11-07: 200 mg via ORAL
  Filled 2013-11-07 (×3): qty 1

## 2013-11-07 MED ORDER — GLYCOPYRROLATE 0.2 MG/ML IJ SOLN
INTRAMUSCULAR | Status: AC
  Filled 2013-11-07: qty 1

## 2013-11-07 MED ORDER — MORPHINE SULFATE 2 MG/ML IJ SOLN
2.0000 mg | INTRAMUSCULAR | Status: DC | PRN
  Administered 2013-11-07 – 2013-11-08 (×4): 2 mg via INTRAVENOUS
  Filled 2013-11-07 (×4): qty 1

## 2013-11-07 MED ORDER — FENTANYL CITRATE 0.05 MG/ML IJ SOLN
INTRAMUSCULAR | Status: AC
  Administered 2013-11-07: 50 ug via INTRAVENOUS
  Filled 2013-11-07: qty 2

## 2013-11-07 MED ORDER — MIDAZOLAM HCL 2 MG/2ML IJ SOLN
INTRAMUSCULAR | Status: AC
  Administered 2013-11-07: 1 mg via INTRAVENOUS
  Filled 2013-11-07: qty 2

## 2013-11-07 MED ORDER — SODIUM CHLORIDE 0.9 % IR SOLN
Status: DC | PRN
  Administered 2013-11-07: 2000 mL

## 2013-11-07 MED ORDER — AMLODIPINE BESYLATE 10 MG PO TABS
10.0000 mg | ORAL_TABLET | Freq: Every day | ORAL | Status: DC
  Administered 2013-11-08 – 2013-11-09 (×2): 10 mg via ORAL
  Filled 2013-11-07 (×4): qty 1

## 2013-11-07 MED ORDER — METHOCARBAMOL 500 MG PO TABS: 500.0000 mg | ORAL_TABLET | Freq: Four times a day (QID) | ORAL | Status: DC | PRN

## 2013-11-07 MED ORDER — ONDANSETRON HCL 4 MG PO TABS
4.0000 mg | ORAL_TABLET | Freq: Four times a day (QID) | ORAL | Status: DC | PRN
  Administered 2013-11-10: 4 mg via ORAL
  Filled 2013-11-07 (×2): qty 1

## 2013-11-07 MED ORDER — ACETAMINOPHEN 325 MG PO TABS
650.0000 mg | ORAL_TABLET | Freq: Four times a day (QID) | ORAL | Status: DC | PRN
  Administered 2013-11-09: 650 mg via ORAL
  Filled 2013-11-07: qty 2

## 2013-11-07 MED ORDER — MENTHOL 3 MG MT LOZG: 1.0000 | LOZENGE | OROMUCOSAL | Status: DC | PRN

## 2013-11-07 MED ORDER — METOCLOPRAMIDE HCL 5 MG/ML IJ SOLN: 5.0000 mg | Freq: Three times a day (TID) | INTRAMUSCULAR | Status: DC | PRN

## 2013-11-07 MED ORDER — SIMVASTATIN 10 MG PO TABS
10.0000 mg | ORAL_TABLET | Freq: Every day | ORAL | Status: DC
  Administered 2013-11-07 – 2013-11-10 (×4): 10 mg via ORAL
  Filled 2013-11-07 (×5): qty 1

## 2013-11-07 MED ORDER — POLYETHYLENE GLYCOL 3350 17 G PO PACK: 17.0000 g | PACK | Freq: Every day | ORAL | Status: DC | PRN

## 2013-11-07 MED ORDER — OXYCODONE HCL 5 MG PO TABS
ORAL_TABLET | ORAL | Status: AC
  Filled 2013-11-07: qty 2

## 2013-11-07 MED ORDER — DEXTROSE 5 % IV SOLN
500.0000 mg | Freq: Four times a day (QID) | INTRAVENOUS | Status: DC | PRN
  Filled 2013-11-07: qty 5

## 2013-11-07 MED ORDER — PHENOL 1.4 % MT LIQD: 1.0000 | OROMUCOSAL | Status: DC | PRN

## 2013-11-07 MED ORDER — FLUTICASONE PROPIONATE 50 MCG/ACT NA SUSP: 2.0000 | Freq: Every day | NASAL | Status: DC | PRN

## 2013-11-07 MED ORDER — LACTATED RINGERS IV SOLN
INTRAVENOUS | Status: DC
  Administered 2013-11-07 (×2): via INTRAVENOUS

## 2013-11-07 MED ORDER — ZOLPIDEM TARTRATE 5 MG PO TABS: 5.0000 mg | ORAL_TABLET | Freq: Every evening | ORAL | Status: DC | PRN

## 2013-11-07 MED ORDER — ONDANSETRON HCL 4 MG/2ML IJ SOLN
INTRAMUSCULAR | Status: DC | PRN
  Administered 2013-11-07: 4 mg via INTRAVENOUS

## 2013-11-07 MED ORDER — KETOROLAC TROMETHAMINE 15 MG/ML IJ SOLN
15.0000 mg | Freq: Four times a day (QID) | INTRAMUSCULAR | Status: AC
  Administered 2013-11-07 – 2013-11-08 (×4): 15 mg via INTRAVENOUS
  Filled 2013-11-07 (×4): qty 1

## 2013-11-07 MED ORDER — MIDAZOLAM HCL 2 MG/2ML IJ SOLN
1.0000 mg | Freq: Once | INTRAMUSCULAR | Status: AC
  Administered 2013-11-07: 1 mg via INTRAVENOUS
  Filled 2013-11-07: qty 1

## 2013-11-07 MED ORDER — FENTANYL CITRATE 0.05 MG/ML IJ SOLN
50.0000 ug | Freq: Once | INTRAMUSCULAR | Status: AC
  Administered 2013-11-07: 50 ug via INTRAVENOUS

## 2013-11-07 MED ORDER — LEVOTHYROXINE SODIUM 175 MCG PO TABS
175.0000 ug | ORAL_TABLET | Freq: Every day | ORAL | Status: DC
  Filled 2013-11-07: qty 1

## 2013-11-07 MED ORDER — ALBUTEROL SULFATE (2.5 MG/3ML) 0.083% IN NEBU: 2.5000 mg | INHALATION_SOLUTION | Freq: Four times a day (QID) | RESPIRATORY_TRACT | Status: DC | PRN

## 2013-11-07 MED ORDER — ALBUTEROL SULFATE HFA 108 (90 BASE) MCG/ACT IN AERS
INHALATION_SPRAY | RESPIRATORY_TRACT | Status: AC
  Filled 2013-11-07: qty 6.7

## 2013-11-07 SURGICAL SUPPLY — 68 items
BANDAGE ELASTIC 4 VELCRO ST LF (GAUZE/BANDAGES/DRESSINGS) ×2 IMPLANT
BANDAGE ELASTIC 6 VELCRO ST LF (GAUZE/BANDAGES/DRESSINGS) ×2 IMPLANT
BANDAGE ESMARK 6X9 LF (GAUZE/BANDAGES/DRESSINGS) ×1 IMPLANT
BENZOIN TINCTURE PRP APPL 2/3 (GAUZE/BANDAGES/DRESSINGS) ×2 IMPLANT
BLADE SAGITTAL 25.0X1.19X90 (BLADE) ×2 IMPLANT
BLADE SAW SGTL 13X75X1.27 (BLADE) ×2 IMPLANT
BNDG ELASTIC 6X10 VLCR STRL LF (GAUZE/BANDAGES/DRESSINGS) ×2 IMPLANT
BNDG ESMARK 6X9 LF (GAUZE/BANDAGES/DRESSINGS) ×2
BOWL SMART MIX CTS (DISPOSABLE) ×2 IMPLANT
CAPT RP KNEE ×2 IMPLANT
CEMENT HV SMART SET (Cement) ×4 IMPLANT
CLSR STERI-STRIP ANTIMIC 1/2X4 (GAUZE/BANDAGES/DRESSINGS) ×2 IMPLANT
COVER BACK TABLE 24X17X13 BIG (DRAPES) IMPLANT
COVER SURGICAL LIGHT HANDLE (MISCELLANEOUS) ×2 IMPLANT
CUFF TOURNIQUET SINGLE 34IN LL (TOURNIQUET CUFF) ×2 IMPLANT
CUFF TOURNIQUET SINGLE 44IN (TOURNIQUET CUFF) IMPLANT
DERMABOND ADHESIVE PROPEN (GAUZE/BANDAGES/DRESSINGS) ×1
DERMABOND ADVANCED .7 DNX6 (GAUZE/BANDAGES/DRESSINGS) ×1 IMPLANT
DRAPE ORTHO SPLIT 77X108 STRL (DRAPES) ×2
DRAPE SURG ORHT 6 SPLT 77X108 (DRAPES) ×2 IMPLANT
DRAPE U-SHAPE 47X51 STRL (DRAPES) ×2 IMPLANT
DRSG PAD ABDOMINAL 8X10 ST (GAUZE/BANDAGES/DRESSINGS) ×4 IMPLANT
DURAPREP 26ML APPLICATOR (WOUND CARE) ×2 IMPLANT
ELECT REM PT RETURN 9FT ADLT (ELECTROSURGICAL) ×2
ELECTRODE REM PT RTRN 9FT ADLT (ELECTROSURGICAL) ×1 IMPLANT
FACESHIELD WRAPAROUND (MASK) ×2 IMPLANT
GAUZE XEROFORM 5X9 LF (GAUZE/BANDAGES/DRESSINGS) ×2 IMPLANT
GLOVE BIOGEL PI IND STRL 7.5 (GLOVE) ×1 IMPLANT
GLOVE BIOGEL PI IND STRL 8 (GLOVE) ×1 IMPLANT
GLOVE BIOGEL PI INDICATOR 7.5 (GLOVE) ×1
GLOVE BIOGEL PI INDICATOR 8 (GLOVE) ×1
GLOVE ECLIPSE 7.0 STRL STRAW (GLOVE) ×2 IMPLANT
GLOVE ORTHO TXT STRL SZ7.5 (GLOVE) ×2 IMPLANT
GOWN STRL REUS W/ TWL LRG LVL3 (GOWN DISPOSABLE) ×3 IMPLANT
GOWN STRL REUS W/ TWL XL LVL3 (GOWN DISPOSABLE) ×1 IMPLANT
GOWN STRL REUS W/TWL LRG LVL3 (GOWN DISPOSABLE) ×3
GOWN STRL REUS W/TWL XL LVL3 (GOWN DISPOSABLE) ×1
HANDPIECE INTERPULSE COAX TIP (DISPOSABLE) ×1
IMMOBILIZER KNEE 22 UNIV (SOFTGOODS) ×2 IMPLANT
KIT BASIN OR (CUSTOM PROCEDURE TRAY) ×2 IMPLANT
KIT ROOM TURNOVER OR (KITS) ×2 IMPLANT
MANIFOLD NEPTUNE II (INSTRUMENTS) ×2 IMPLANT
MARKER SPHERE PSV REFLC THRD 5 (MARKER) ×6 IMPLANT
NEEDLE 1/2 CIR MAYO (NEEDLE) ×2 IMPLANT
NEEDLE HYPO 25GX1X1/2 BEV (NEEDLE) ×2 IMPLANT
NS IRRIG 1000ML POUR BTL (IV SOLUTION) ×2 IMPLANT
PACK TOTAL JOINT (CUSTOM PROCEDURE TRAY) ×2 IMPLANT
PAD ARMBOARD 7.5X6 YLW CONV (MISCELLANEOUS) ×4 IMPLANT
PAD CAST 4YDX4 CTTN HI CHSV (CAST SUPPLIES) ×1 IMPLANT
PADDING CAST COTTON 4X4 STRL (CAST SUPPLIES) ×1
PADDING CAST COTTON 6X4 STRL (CAST SUPPLIES) ×2 IMPLANT
PIN SCHANZ 4MM 130MM (PIN) ×8 IMPLANT
SET HNDPC FAN SPRY TIP SCT (DISPOSABLE) ×1 IMPLANT
SPONGE GAUZE 4X4 12PLY (GAUZE/BANDAGES/DRESSINGS) ×2 IMPLANT
SPONGE GAUZE 4X4 12PLY STER LF (GAUZE/BANDAGES/DRESSINGS) ×2 IMPLANT
STAPLER VISISTAT 35W (STAPLE) ×2 IMPLANT
STRIP CLOSURE SKIN 1/2X4 (GAUZE/BANDAGES/DRESSINGS) ×4 IMPLANT
SUCTION FRAZIER TIP 10 FR DISP (SUCTIONS) ×2 IMPLANT
SUT ETHIBOND NAB CT1 #1 30IN (SUTURE) ×2 IMPLANT
SUT VIC AB 2-0 CT1 27 (SUTURE) ×2
SUT VIC AB 2-0 CT1 TAPERPNT 27 (SUTURE) ×2 IMPLANT
SUT VICRYL 4-0 PS2 18IN ABS (SUTURE) ×2 IMPLANT
SUT VICRYL AB 2 0 TIES (SUTURE) ×2 IMPLANT
SYR CONTROL 10ML LL (SYRINGE) ×2 IMPLANT
TOWEL OR 17X24 6PK STRL BLUE (TOWEL DISPOSABLE) ×2 IMPLANT
TOWEL OR 17X26 10 PK STRL BLUE (TOWEL DISPOSABLE) ×2 IMPLANT
TRAY FOLEY CATH 16FRSI W/METER (SET/KITS/TRAYS/PACK) ×2 IMPLANT
WATER STERILE IRR 1000ML POUR (IV SOLUTION) ×6 IMPLANT

## 2013-11-07 NOTE — Discharge Instructions (Signed)
Keep knee incision dry for 5 days post op then may wet while bathing. Therapy daily and goal full extension and greater than 90 degrees flexion. Call if fever or chills or increased drainage. Go to ER if acutely short of breath or call for ambulance. Return for follow up in 2 weeks. May full weight bear on the surgical leg unless told otherwise. Use knee immobilizer until able to straight leg raise off bed with knee stable. In house walking for first 2 weeks.

## 2013-11-07 NOTE — Anesthesia Preprocedure Evaluation (Signed)
Anesthesia Evaluation  Patient identified by MRN, date of birth, ID band Patient awake    Reviewed: Allergy & Precautions, H&P , NPO status , Patient's Chart, lab work & pertinent test results  Airway Mallampati: II TM Distance: >3 FB Neck ROM: Full    Dental  (+) Teeth Intact, Dental Advisory Given   Pulmonary former smoker,  breath sounds clear to auscultation        Cardiovascular hypertension, Rhythm:Regular Rate:Normal     Neuro/Psych    GI/Hepatic   Endo/Other    Renal/GU      Musculoskeletal   Abdominal   Peds  Hematology   Anesthesia Other Findings   Reproductive/Obstetrics                           Anesthesia Physical Anesthesia Plan  ASA: II  Anesthesia Plan: General   Post-op Pain Management:    Induction: Intravenous  Airway Management Planned: LMA  Additional Equipment:   Intra-op Plan:   Post-operative Plan: Extubation in OR  Informed Consent: I have reviewed the patients History and Physical, chart, labs and discussed the procedure including the risks, benefits and alternatives for the proposed anesthesia with the patient or authorized representative who has indicated his/her understanding and acceptance.   Dental advisory given  Plan Discussed with: CRNA and Anesthesiologist  Anesthesia Plan Comments: (DJD L. Knee Hypertension Seizure disorder well controlled on Tegretol  Plan GA with LMA and adductor canal block  Roberts Gaudy, MD )        Anesthesia Quick Evaluation

## 2013-11-07 NOTE — Transfer of Care (Signed)
Immediate Anesthesia Transfer of Care Note  Patient: Beverly Ballard  Procedure(s) Performed: Procedure(s) with comments: COMPUTER ASSISTED TOTAL KNEE ARTHROPLASTY (Left) - Left Total Knee Arthroplasty, Cemented, Computer Assist  Patient Location: PACU  Anesthesia Type:GA combined with regional for post-op pain  Level of Consciousness: sedated  Airway & Oxygen Therapy: Patient Spontanous Breathing and Patient connected to nasal cannula oxygen  Post-op Assessment: Report given to PACU RN, Post -op Vital signs reviewed and stable and Patient moving all extremities  Post vital signs: Reviewed and stable  Complications: No apparent anesthesia complications

## 2013-11-07 NOTE — Anesthesia Procedure Notes (Addendum)
Procedure Name: LMA Insertion Date/Time: 11/07/2013 2:27 PM Performed by: Melina Copa, Cortne Amara R Pre-anesthesia Checklist: Patient identified, Emergency Drugs available, Suction available, Patient being monitored and Timeout performed Patient Re-evaluated:Patient Re-evaluated prior to inductionOxygen Delivery Method: Circle system utilized Preoxygenation: Pre-oxygenation with 100% oxygen Intubation Type: IV induction Ventilation: Mask ventilation without difficulty LMA: LMA with gastric port inserted LMA Size: 4.0 Number of attempts: 1 Placement Confirmation: positive ETCO2 and breath sounds checked- equal and bilateral Tube secured with: Tape Dental Injury: Teeth and Oropharynx as per pre-operative assessment

## 2013-11-07 NOTE — Anesthesia Postprocedure Evaluation (Signed)
  Anesthesia Post-op Note  Patient: Beverly Ballard  Procedure(s) Performed: Procedure(s) with comments: COMPUTER ASSISTED TOTAL KNEE ARTHROPLASTY (Left) - Left Total Knee Arthroplasty, Cemented, Computer Assist  Patient Location: PACU  Anesthesia Type:General  Level of Consciousness: awake  Airway and Oxygen Therapy: Patient Spontanous Breathing  Post-op Pain: mild  Post-op Assessment: Post-op Vital signs reviewed  Post-op Vital Signs: Reviewed  Last Vitals:  Filed Vitals:   11/07/13 1930  BP:   Pulse: 56  Temp:   Resp: 12    Complications: No apparent anesthesia complications

## 2013-11-07 NOTE — Brief Op Note (Cosign Needed)
11/07/2013  4:39 PM  PATIENT:  Beverly Ballard  68 y.o. female  PRE-OPERATIVE DIAGNOSIS:  Left Knee Osteoarthritis  POST-OPERATIVE DIAGNOSIS:  Left Knee Osteoarthritis  PROCEDURE:  Procedure(s) with comments: COMPUTER ASSISTED TOTAL KNEE ARTHROPLASTY (Left) - Left Total Knee Arthroplasty, Cemented, Computer Assist  SURGEON:  Surgeon(s) and Role:    * Marybelle Killings, MD - Primary  PHYSICIAN ASSISTANT: Lewis Grivas [facility]  ASSISTANTS: none   ANESTHESIA:   general  EBL:  Total I/O In: 1200 [I.V.:1200] Out: 250 [Urine:250]  BLOOD ADMINISTERED:none  DRAINS: none   LOCAL MEDICATIONS USED:  NONE  SPECIMEN:  No Specimen  DISPOSITION OF SPECIMEN:  N/A  COUNTS:  YES  TOURNIQUET:   Total Tourniquet Time Documented: Thigh (Left) - 27420 minutes Total: Thigh (Left) - 27420 minutes   DICTATION: .Note written in EPIC  PLAN OF CARE: Admit to inpatient   PATIENT DISPOSITION:  PACU - hemodynamically stable.   Delay start of Pharmacological VTE agent (>24hrs) due to surgical blood loss or risk of bleeding: no

## 2013-11-07 NOTE — Progress Notes (Signed)
Orthopedic Tech Progress Note Patient Details:  Beverly Ballard [birth year] [phone] cpm at 30 degrees rn may increase when patient can tolerate.  CPM Left Knee CPM Left Knee: On Left Knee Flexion (Degrees): 30 Left Knee Extension (Degrees): 0 Additional Comments: applied ohf  and footsie roll on bed  Ortho Devices Ortho Device/Splint Location: footsie roll Ortho Device/Splint Interventions: Ordered   Braulio Bosch 11/07/2013, 5:16 PM

## 2013-11-08 ENCOUNTER — Encounter (HOSPITAL_COMMUNITY): Payer: Self-pay | Admitting: Orthopaedic Surgery

## 2013-11-08 LAB — CBC
HCT: 34.1 % — ABNORMAL LOW (ref 36.0–46.0)
Hemoglobin: 11.5 g/dL — ABNORMAL LOW (ref 12.0–15.0)
MCH: 31.7 pg (ref 26.0–34.0)
MCHC: 33.7 g/dL (ref 30.0–36.0)
MCV: 93.9 fL (ref 78.0–100.0)
PLATELETS: 186 10*3/uL (ref 150–400)
RBC: 3.63 MIL/uL — ABNORMAL LOW (ref 3.87–5.11)
RDW: 12.6 % (ref 11.5–15.5)
WBC: 10.5 10*3/uL (ref 4.0–10.5)

## 2013-11-08 LAB — BASIC METABOLIC PANEL
ANION GAP: 10 (ref 5–15)
BUN: 12 mg/dL (ref 6–23)
CALCIUM: 8.3 mg/dL — AB (ref 8.4–10.5)
CO2: 28 mEq/L (ref 19–32)
Chloride: 94 mEq/L — ABNORMAL LOW (ref 96–112)
Creatinine, Ser: 0.67 mg/dL (ref 0.50–1.10)
GFR calc Af Amer: >90 mL/min (ref >90)
GFR calc non Af Amer: >90 mL/min (ref >90)
Glucose, Bld: 140 mg/dL — ABNORMAL HIGH (ref 70–99)
Potassium: 6 mEq/L — ABNORMAL HIGH (ref 3.7–5.3)
Sodium: 132 mEq/L — ABNORMAL LOW (ref 137–147)

## 2013-11-08 MED ORDER — CARBAMAZEPINE 200 MG PO TABS
400.0000 mg | ORAL_TABLET | Freq: Two times a day (BID) | ORAL | Status: DC
  Administered 2013-11-08 (×2): 400 mg via ORAL
  Administered 2013-11-08: 200 mg via ORAL
  Administered 2013-11-09 – 2013-11-11 (×5): 400 mg via ORAL
  Filled 2013-11-08 (×8): qty 2

## 2013-11-08 MED ORDER — DEXTROSE-NACL 5-0.45 % IV SOLN
INTRAVENOUS | Status: DC
  Administered 2013-11-08 – 2013-11-10 (×3): via INTRAVENOUS

## 2013-11-08 MED ORDER — LEVOTHYROXINE SODIUM 175 MCG PO TABS
175.0000 ug | ORAL_TABLET | Freq: Every day | ORAL | Status: DC
  Administered 2013-11-08 – 2013-11-11 (×4): 175 ug via ORAL
  Filled 2013-11-08 (×5): qty 1

## 2013-11-08 NOTE — Evaluation (Signed)
Physical Therapy Evaluation Patient Details Name: Beverly Ballard MRN: [id number] DOB: 04/18/1946 Today's Date: 11/08/2013   History of Present Illness  pt presents with L TKA.    Clinical Impression  Pt very motivated and only requiring MinA for mobility, but amb limited by pt nausea and lightheadedness.  Feel when nausea resolves pt will make great progress.  Will continue to follow.      Follow Up Recommendations Home health PT;Supervision for mobility/OOB    Equipment Recommendations  None recommended by PT    Recommendations for Other Services       Precautions / Restrictions Precautions Precautions: Fall Required Braces or Orthoses: Knee Immobilizer - Left Knee Immobilizer - Left: On when out of bed or walking;Discontinue once straight leg raise with < 10 degree lag Restrictions Weight Bearing Restrictions: Yes LLE Weight Bearing: Weight bearing as tolerated      Mobility  Bed Mobility Overal bed mobility: Needs Assistance Bed Mobility: Supine to Sit     Supine to sit: Min assist;HOB elevated     General bed mobility comments: cues for sequencing and A with L LE only.    Transfers Overall transfer level: Needs assistance Equipment used: Rolling walker (2 wheeled) Transfers: Sit to/from [facility] Sit to Stand: Min assist Stand pivot transfers: Min assist       General transfer comment: cues for UE use and movement through pivot.  pt nausea limiting mobility.    Ambulation/Gait                Stairs            Wheelchair Mobility    Modified Rankin (Stroke Patients Only)       Balance                                             Pertinent Vitals/Pain 8/10.  RN arrived to medicated.      Home Living Family/patient expects to be discharged to:: Private residence Living Arrangements: Spouse/significant other Available Help at Discharge: Family;Available 24 hours/day Type of Home: House Home  Access: Stairs to enter Entrance Stairs-Rails: Right Entrance Stairs-Number of Steps: 7 Home Layout: One level Home Equipment: Walker - 2 wheels;Bedside commode      Prior Function Level of Independence: Independent               Hand Dominance        Extremity/Trunk Assessment   Upper Extremity Assessment: Defer to OT evaluation           Lower Extremity Assessment: LLE deficits/detail   LLE Deficits / Details: pt generally weak post-op.  ROM not tested 2/2 pt nausea and dry heaving.    Cervical / Trunk Assessment: Normal  Communication   Communication: No difficulties  Cognition Arousal/Alertness: Awake/alert Behavior During Therapy: WFL for tasks assessed/performed Overall Cognitive Status: Within Functional Limits for tasks assessed                      General Comments      Exercises Total Joint Exercises Ankle Circles/Pumps: AROM;Both;10 reps Quad Sets: AROM;Both;10 reps      Assessment/Plan    PT Assessment Patient needs continued PT services  PT Diagnosis Abnormality of gait;Acute pain   PT Problem List Decreased strength;Decreased range of motion;Decreased activity tolerance;Decreased balance;Decreased mobility;Decreased knowledge of use of DME;Pain  PT Treatment Interventions DME instruction;Gait training;Stair training;Functional mobility training;Therapeutic activities;Therapeutic exercise;Balance training;Patient/family education   PT Goals (Current goals can be found in the Care Plan section) Acute Rehab PT Goals Patient Stated Goal: Walk PT Goal Formulation: With patient Time For Goal Achievement: 11/15/13 Potential to Achieve Goals: Good    Frequency 7X/week   Barriers to discharge        Co-evaluation               End of Session Equipment Utilized During Treatment: Gait belt;Left knee immobilizer Activity Tolerance:  (Limited by nausea) Patient left: in chair;with call bell/phone within reach Nurse  Communication: Mobility status (Nausea)         Time: 5809-9833 PT Time Calculation (min): 29 min   Charges:   PT Evaluation $Initial PT Evaluation Tier I: 1 Procedure PT Treatments $Therapeutic Activity: 23-37 mins   PT G CodesCatarina Hartshorn, [place] [phone] 11/08/2013, 10:20 AM

## 2013-11-08 NOTE — Op Note (Signed)
NAMEMarland Kitchen  Beverly Ballard, Beverly Ballard                 ACCOUNT NO.:  [id number]  MEDICAL RECORD NO.:  [id number]  LOCATION:  5N08C                        FACILITY:  [facility]  PHYSICIAN:  Katharina Jehle C. Lorin Mercy, M.D.    DATE OF BIRTH:  [birth year]  DATE OF PROCEDURE:  11/07/2013 DATE OF DISCHARGE:                              OPERATIVE REPORT   PREOPERATIVE DIAGNOSIS:  Left knee osteoarthritis.  POSTOPERATIVE DIAGNOSIS:  Left knee osteoarthritis.  PROCEDURE: Cemented  Left total knee arthroplasty #4 narrow femur, #3 tibia, 10- mm spacer, and 35-mm 3-pegged patella.  Femoral component is with lugs. Depuy  SURGEON:  Xaiden Fleig C. Lorin Mercy, MD  ASSISTANT:  Phillips Hay, PA-C, medically necessary and present for the entire procedure.  ANESTHESIA:  Preoperative block plus general anesthesia.  TOURNIQUET TIME:  59 minutes.  DESCRIPTION OF PROCEDURE:  After standard prepping and draping, preoperative Ancef prophylaxis, sterile drapes, impervious stockinette, Coban, usual sterile skin marker, Betadine, Steri-Drape sealing the skin, time-out procedure, Esmarch wrapping, and midline incision. Medial parapatellar incision was made.  Quad tendon was split between the medial one-third and lateral two-thirds.  There was bone-on-bone changes in the medial compartment.  A 1-cm femoral osteophytes that were removed tibial osteophytes.  Computer was used and a stab incision was made.  Two pins were placed in the tibia.  Pins were placed inside the femur and a computer model was generated.  A 9 mm was taken off the distal femur.  The patient had 2 degrees to 3 degrees flexion contracture, and 3 degrees of varus.  Distal cuts were made,  Chamfer cuts, box cut was not made, the tibia was prepared.  Cut on the tibia sizing for a 3 keel preparation.  Meniscal remnants and ACL and PCL were completely resected.  Posterior spur was removed off the femur with 3/4 curved osteotome.  Balance flexion-extension was less than 1 mm difference.   Cement was vacuum mixed, tibia was cemented first followed by femur, placed on the polyethylene 10 mm component which was a #4 since the femur was a 4 narrow and 35-mm 3-pegged patellar component. 10 mm was resected off the patella.  Pulsatile lavage was used for the bone preparation before the cement which was vacuum mixed.  15 minutes cement was dried, tourniquet was deflated, hemostasis obtained, and then standard layered closure.  Instrument count and needle count was correct.     Mustapha Colson C. Lorin Mercy, M.D.     MCY/MEDQ  D:  11/07/2013  T:  11/08/2013  Job:  [id number]

## 2013-11-08 NOTE — Progress Notes (Signed)
Physical Therapy Treatment Patient Details Name: Beverly Ballard MRN: [id number] DOB: 01-Jan-1946 Today's Date: 11/08/2013    History of Present Illness pt presents with L TKA.      PT Comments    Pt continues to be nauseated today limiting participation in mobility.  Pt agreeable for bed exercises.  Will continue to follow.    Follow Up Recommendations  Home health PT;Supervision for mobility/OOB     Equipment Recommendations  None recommended by PT    Recommendations for Other Services       Precautions / Restrictions Precautions Precautions: Fall Required Braces or Orthoses: Knee Immobilizer - Left Knee Immobilizer - Left: On when out of bed or walking;Discontinue once straight leg raise with < 10 degree lag Restrictions Weight Bearing Restrictions: Yes LLE Weight Bearing: Weight bearing as tolerated    Mobility  Bed Mobility                  Transfers                    Ambulation/Gait                 Stairs            Wheelchair Mobility    Modified Rankin (Stroke Patients Only)       Balance                                    Cognition Arousal/Alertness: Awake/alert Behavior During Therapy: WFL for tasks assessed/performed Overall Cognitive Status: Within Functional Limits for tasks assessed                      Exercises Total Joint Exercises Ankle Circles/Pumps: AROM;Both;10 reps Quad Sets: AROM;Both;10 reps Short Arc QuadSinclair Ballard;Left;10 reps Heel Slides: AAROM;Left;10 reps Hip ABduction/ADduction: AAROM;Left;10 reps Straight Leg Raises: AAROM;Left;10 reps    General Comments        Pertinent Vitals/Pain Nausea more than pain.      Home Living                      Prior Function            PT Goals (current goals can now be found in the care plan section) Acute Rehab PT Goals Patient Stated Goal: not stated Time For Goal Achievement: 11/15/13 Potential to Achieve  Goals: Good Progress towards PT goals: Progressing toward goals    Frequency  7X/week    PT Plan Current plan remains appropriate    Co-evaluation             End of Session   Activity Tolerance:  (Limited by nausea) Patient left: in bed;with call bell/phone within reach;with family/visitor present     Time: 7253-6644 PT Time Calculation (min): 27 min  Charges:  $Therapeutic Exercise: 8-22 mins                    G CodesCatarina Ballard, [place] [phone] 11/08/2013, 3:45 PM

## 2013-11-08 NOTE — Evaluation (Addendum)
Occupational Therapy Evaluation Patient Details Name: Beverly Ballard MRN: [id number] DOB: [birth year] Today's Date: 11/08/2013    History of Present Illness pt presents with L TKA.     Clinical Impression   Pt s/p above. Pt limited by nausea during session. Pt independent with ADLs, PTA. Feel pt will benefit from acute OT to increase independence prior to d/c.    Follow Up Recommendations  No OT follow up;Supervision - Intermittent    Equipment Recommendations  None recommended by OT    Recommendations for Other Services       Precautions / Restrictions Precautions Precautions: Fall Required Braces or Orthoses: Knee Immobilizer - Left Knee Immobilizer - Left: On when out of bed or walking;Discontinue once straight leg raise with < 10 degree lag Restrictions Weight Bearing Restrictions: Yes LLE Weight Bearing: Weight bearing as tolerated      Mobility Bed Mobility Overal bed mobility: Needs Assistance Bed Mobility: Sit to Supine     Sit to Supine: Min guard     General bed mobility comments: Min guard for safety. Pt able to perform without physical assist.  Transfers Overall transfer level: Needs assistance Equipment used: Rolling walker (2 wheeled) Transfers: Sit to/from [facility] Sit to Stand: Min assist Stand pivot transfers: Min guard       General transfer comment: Cues for technique/hand placement.    Balance                                            ADL Overall ADL's : Needs assistance/impaired     Grooming: Set up;Supervision/safety;Sitting               Lower Body Dressing: Moderate assistance;Sit to/from stand   Toilet Transfer: Stand-pivot;BSC;RW;Minimal assistance (Min A for sit to stand)   Toileting- Water quality scientist and Hygiene: Min guard (standing)       Functional mobility during ADLs: Rolling walker;Minimal assistance General ADL Comments: Educated on safety tips (rugs, safe  shoewear, use of bag on walker, sitting for most of LB ADLs, recommended spouse be with her for shower transfer). Educated on AE for LB ADLs. Explained purpose of footsie roll and knee immobilizer. Demonstrated how to don knee immobilizer. Educated on shower transfer techniques. Educated on dressing technique. Explained benefit of trying to reach to don/doff left sock as it forces knee to bend.      Vision                     Perception     Praxis      Pertinent Vitals/Pain Pain 8/10. Repositioned.      Hand Dominance     Extremity/Trunk Assessment Upper Extremity Assessment Upper Extremity Assessment: Overall WFL for tasks assessed   Lower Extremity Assessment Lower Extremity Assessment: Defer to PT evaluation LLE Deficits / Details: pt generally weak post-op.  ROM not tested 2/2 pt nausea and dry heaving.     Cervical / Trunk Assessment Cervical / Trunk Assessment: Normal   Communication Communication Communication: No difficulties   Cognition Arousal/Alertness: Awake/alert Behavior During Therapy: WFL for tasks assessed/performed Overall Cognitive Status: Within Functional Limits for tasks assessed                     General Comments          Shoulder Instructions      Home  Living Family/patient expects to be discharged to:: Private residence Living Arrangements: Spouse/significant other Available Help at Discharge: Family;Available 24 hours/day Type of Home: House Home Access: Stairs to enter [employer] of Steps: 7 Entrance Stairs-Rails: Right Home Layout: One level     Bathroom Shower/Tub: Occupational psychologist: Handicapped height     Home Equipment: Environmental consultant - 2 wheels;Bedside commode;Adaptive equipment;Hand held shower head Adaptive Equipment: Reacher;Long-handled sponge        Prior Functioning/Environment Level of Independence: Independent             OT Diagnosis: Acute pain   OT Problem List:  Decreased strength;Decreased range of motion;Decreased activity tolerance;Impaired balance (sitting and/or standing);Decreased knowledge of use of DME or AE;Decreased knowledge of precautions;Pain   OT Treatment/Interventions: Self-care/ADL training;DME and/or AE instruction;Therapeutic activities;Patient/family education;Balance training    OT Goals(Current goals can be found in the care plan section) Acute Rehab OT Goals Patient Stated Goal: not stated OT Goal Formulation: With patient Time For Goal Achievement: 11/15/13 Potential to Achieve Goals: Good ADL Goals Pt Will Perform Lower Body Dressing: with modified independence;with adaptive equipment;sit to/from stand Pt Will Transfer to Toilet: with modified independence;ambulating (3 in 1 over commode) Pt Will Perform Toileting - Clothing Manipulation and hygiene: with modified independence;sit to/from stand Pt Will Perform Tub/Shower Transfer: Shower transfer;with supervision;ambulating;rolling walker (shower DME tbd)  OT Frequency: Min 2X/week   Barriers to D/C:            Co-evaluation              End of Session Equipment Utilized During Treatment: Gait belt;Rolling walker;Left knee immobilizer CPM Left Knee CPM Left Knee: Off Nurse Communication: Other (comment) (nauseous)  Activity Tolerance: Other (comment) (nauseous) Patient left: in bed;with call bell/phone within reach;with family/visitor present   Time: 8299-3716 OT Time Calculation (min): 26 min Charges:  OT General Charges $OT Visit: 1 Procedure OT Evaluation $Initial OT Evaluation Tier I: 1 Procedure OT Treatments $Self Care/Home Management : 8-22 mins G-CodesBenito Mccreedy OTR/L [phone] 11/08/2013, 11:13 AM

## 2013-11-08 NOTE — Progress Notes (Signed)
Dr Lorin Mercy made aware of 6 potassium d/cd IV  With K ordered d51/2 at 75 cc hr

## 2013-11-08 NOTE — Progress Notes (Signed)
Utilization review competed.

## 2013-11-08 NOTE — Progress Notes (Signed)
Subjective: 1 Day Post-Op Procedure(s) (LRB): COMPUTER ASSISTED TOTAL KNEE ARTHROPLASTY (Left) Patient reports pain as severe.  Nausea when she sat up to do therapy  Objective: Vital signs in last 24 hours: Temp:  [97.7 F (36.5 C)-98.4 F (36.9 C)] 98.4 F (36.9 C) (07/21 0652) Pulse Rate:  [54-74] 71 (07/21 0652) Resp:  [6-18] 16 (07/21 0652) BP: (120-197)/(48-90) 128/58 mmHg (07/21 0652) SpO2:  [97 %-100 %] 98 % (07/21 0652) Weight:  [98.8 kg (217 lb 13 oz)] 98.8 kg (217 lb 13 oz) (07/20 1134)  Intake/Output from previous day: 07/20 0701 - 07/21 0700 In: 1781.3 [P.O.:360; I.V.:1421.3] Out: 985 [Urine:985] Intake/Output this shift: Total I/O In: 1107.5 [P.O.:240; I.V.:867.5] Out: -    Recent Labs  11/08/13 0454  HGB 11.5*    Recent Labs  11/08/13 0454  WBC 10.5  RBC 3.63*  HCT 34.1*  PLT 186    Recent Labs  11/08/13 0454  NA 132*  K 6.0*  CL 94*  CO2 28  BUN 12  CREATININE 0.67  GLUCOSE 140*  CALCIUM 8.3*   No results found for this basename: LABPT, INR,  in the last 72 hours  Neurologically intact  Assessment/Plan: 1 Day Post-Op Procedure(s) (LRB): COMPUTER ASSISTED TOTAL KNEE ARTHROPLASTY (Left) Up with therapy  Beverly Ballard C 11/08/2013, 8:01 AM

## 2013-11-08 NOTE — Plan of Care (Signed)
Problem: Consults Goal: Diagnosis- Total Joint Replacement Primary Total Knee     

## 2013-11-09 LAB — CBC
HCT: 29 % — ABNORMAL LOW (ref 36.0–46.0)
Hemoglobin: 9.8 g/dL — ABNORMAL LOW (ref 12.0–15.0)
MCH: 32 pg (ref 26.0–34.0)
MCHC: 33.8 g/dL (ref 30.0–36.0)
MCV: 94.8 fL (ref 78.0–100.0)
Platelets: 156 10*3/uL (ref 150–400)
RBC: 3.06 MIL/uL — ABNORMAL LOW (ref 3.87–5.11)
RDW: 12.9 % (ref 11.5–15.5)
WBC: 8.7 10*3/uL (ref 4.0–10.5)

## 2013-11-09 MED ORDER — PROMETHAZINE HCL 25 MG/ML IJ SOLN: 12.5000 mg | Freq: Four times a day (QID) | INTRAMUSCULAR | Status: DC | PRN

## 2013-11-09 MED ORDER — PROMETHAZINE HCL 25 MG PO TABS
25.0000 mg | ORAL_TABLET | Freq: Four times a day (QID) | ORAL | Status: DC | PRN
  Administered 2013-11-09: 25 mg via ORAL
  Filled 2013-11-09: qty 1

## 2013-11-09 MED ORDER — PROMETHAZINE HCL 25 MG PO TABS: 25.0000 mg | ORAL_TABLET | Freq: Four times a day (QID) | ORAL | Status: DC | PRN

## 2013-11-09 NOTE — Progress Notes (Signed)
OT Cancellation Note  Patient Details Name: Beverly Ballard MRN: [id number] DOB: October 07, 1945   Cancelled Treatment:    Reason Eval/Treat Not Completed: Fatigue/lethargy limiting ability to participate  Benito Mccreedy OTR/L [phone] 11/09/2013, 2:14 PM

## 2013-11-09 NOTE — Progress Notes (Signed)
PT Cancellation Note  Patient Details Name: AMERIE BEAUMONT MRN: [id number] DOB: 1945/06/01   Cancelled Treatment:    Reason Eval/Treat Not Completed: Fatigue/lethargy limiting ability to participate (Nausea).  Pt continues to be nauseated and lethargic.  Pt with difficulty even maintaining arousal.  Will check back another time.     Rahima Fleishman, [place] Papas 11/09/2013, 1:28 PM

## 2013-11-09 NOTE — Progress Notes (Signed)
Subjective: 2 Days Post-Op Procedure(s) (LRB): COMPUTER ASSISTED TOTAL KNEE ARTHROPLASTY (Left) Patient reports pain as 8 on 0-10 scale.    Objective: Vital signs in last 24 hours: Temp:  [98.4 F (36.9 C)] 98.4 F (36.9 C) (07/22 0534) Pulse Rate:  [65-84] 80 (07/22 0534) Resp:  [16-19] 19 (07/22 0534) BP: (121-159)/(49-58) 134/49 mmHg (07/22 0534) SpO2:  [95 %-98 %] 95 % (07/22 0534)  Intake/Output from previous day: 07/21 0701 - 07/22 0700 In: 1947.5 [P.O.:1080; I.V.:867.5] Out: 650 [Urine:650] Intake/Output this shift:     Recent Labs  11/08/13 0454 11/09/13 0605  HGB 11.5* 9.8*    Recent Labs  11/08/13 0454 11/09/13 0605  WBC 10.5 8.7  RBC 3.63* 3.06*  HCT 34.1* 29.0*  PLT 186 156    Recent Labs  11/08/13 0454  NA 132*  K 6.0*  CL 94*  CO2 28  BUN 12  CREATININE 0.67  GLUCOSE 140*  CALCIUM 8.3*   No results found for this basename: LABPT, INR,  in the last 72 hours  Neurologically intact  Assessment/Plan: 2 Days Post-Op Procedure(s) (LRB): COMPUTER ASSISTED TOTAL KNEE ARTHROPLASTY (Left) Up with therapy  She has had post op nausea without vomiting which has not responded to medication. This has slowed her progress.   Parris Signer C 11/09/2013, 7:25 AM

## 2013-11-09 NOTE — Progress Notes (Signed)
Physical Therapy Treatment Patient Details Name: Beverly Ballard MRN: [id number] DOB: 03/18/1946 Today's Date: 11/09/2013    History of Present Illness pt presents with L TKA.      PT Comments    Pt continues being very nauseated and with increased pain today.  Spoke with pt and RN about trying different pain meds as pt's nausea and pain are limiting her ability to participate in PT.  Will continue to follow.    Follow Up Recommendations  Home health PT;Supervision for mobility/OOB     Equipment Recommendations  None recommended by PT    Recommendations for Other Services       Precautions / Restrictions Precautions Precautions: Fall Required Braces or Orthoses: Knee Immobilizer - Left Knee Immobilizer - Left: On when out of bed or walking;Discontinue once straight leg raise with < 10 degree lag Restrictions Weight Bearing Restrictions: Yes LLE Weight Bearing: Weight bearing as tolerated    Mobility  Bed Mobility Overal bed mobility: Needs Assistance Bed Mobility: Supine to Sit;Sit to Supine     Supine to sit: Mod assist;HOB elevated Sit to supine: Mod assist;HOB elevated   General bed mobility comments: A with L LE and bringing trunk up to sitting.    Transfers Overall transfer level: Needs assistance Equipment used: Rolling walker (2 wheeled) Transfers: Sit to/from [facility] Sit to Stand: Mod assist Stand pivot transfers: Min assist       General transfer comment: Increase A needed to come to stand from bed.  pt with increased pain.    Ambulation/Gait                 Stairs            Wheelchair Mobility    Modified Rankin (Stroke Patients Only)       Balance                                    Cognition Arousal/Alertness: Awake/alert Behavior During Therapy: WFL for tasks assessed/performed Overall Cognitive Status: Within Functional Limits for tasks assessed                      Exercises  Total Joint Exercises Ankle Circles/Pumps: AROM;Both;10 reps Quad Sets: AROM;Both;10 reps    General Comments        Pertinent Vitals/Pain 8-9/10 during mobility.  RN made aware.      Home Living                      Prior Function            PT Goals (current goals can now be found in the care plan section) Acute Rehab PT Goals Patient Stated Goal: not stated PT Goal Formulation: With patient Time For Goal Achievement: 11/15/13 Potential to Achieve Goals: Good Progress towards PT goals: Progressing toward goals    Frequency  7X/week    PT Plan Current plan remains appropriate    Co-evaluation             End of Session Equipment Utilized During Treatment: Gait belt;Left knee immobilizer Activity Tolerance:  (Limited by nausea) Patient left: in bed;with call bell/phone within reach;with family/visitor present     Time: 2778-2423 PT Time Calculation (min): 29 min  Charges:  $Therapeutic Activity: 23-37 mins  G CodesCatarina Hartshorn, [place] 11/09/2013, 11:34 AM

## 2013-11-10 LAB — CBC
HCT: 24.4 % — ABNORMAL LOW (ref 36.0–46.0)
Hemoglobin: 8.2 g/dL — ABNORMAL LOW (ref 12.0–15.0)
MCH: 31.4 pg (ref 26.0–34.0)
MCHC: 33.6 g/dL (ref 30.0–36.0)
MCV: 93.5 fL (ref 78.0–100.0)
PLATELETS: 140 10*3/uL — AB (ref 150–400)
RBC: 2.61 MIL/uL — AB (ref 3.87–5.11)
RDW: 12.9 % (ref 11.5–15.5)
WBC: 6.9 10*3/uL (ref 4.0–10.5)

## 2013-11-10 NOTE — Progress Notes (Signed)
Patient ambulated to restroom with knee immobilizer and walker. Stand by assist only, patient improved from last Tuesday.

## 2013-11-10 NOTE — Progress Notes (Signed)
Physical Therapy Treatment Patient Details Name: Beverly Ballard MRN: [id number] DOB: March 19, 1946 Today's Date: 11/10/2013    History of Present Illness pt presents with L TKA.      PT Comments    Pt with decreased nausea today and able to participate in mobility.  Pt does endorse nausea during mobility, however states it is not as bad as last 2 days.  Will continue to follow.    Follow Up Recommendations  Home health PT;Supervision for mobility/OOB     Equipment Recommendations  None recommended by PT    Recommendations for Other Services       Precautions / Restrictions Precautions Precautions: Fall Required Braces or Orthoses: Knee Immobilizer - Left Knee Immobilizer - Left: On when out of bed or walking;Discontinue once straight leg raise with < 10 degree lag Restrictions Weight Bearing Restrictions: Yes LLE Weight Bearing: Weight bearing as tolerated    Mobility  Bed Mobility                  Transfers Overall transfer level: Needs assistance Equipment used: Rolling walker (2 wheeled) Transfers: Sit to/from Stand Sit to Stand: Min assist         General transfer comment: pt with decreased need for A today.  pt needed grab bar in bathroom to come to stand from toilet.    Ambulation/Gait Ambulation/Gait assistance: Min guard Ambulation Distance (Feet): 40 Feet Assistive device: Rolling walker (2 wheeled) Gait Pattern/deviations: Step-to pattern;Decreased step length - right;Decreased stance time - left;Decreased stride length;Trunk flexed     General Gait Details: pt able to amb this am with only mild nausea.  cues for upright psture and encouragement.     Stairs            Wheelchair Mobility    Modified Rankin (Stroke Patients Only)       Balance                                    Cognition Arousal/Alertness: Awake/alert Behavior During Therapy: WFL for tasks assessed/performed Overall Cognitive Status: Within  Functional Limits for tasks assessed                      Exercises Total Joint Exercises Long Arc Quad: AAROM;Left;10 reps Knee Flexion: AAROM;Left;10 reps Goniometric ROM: ~10 - 50    General Comments        Pertinent Vitals/Pain 4-5/10.  Pt called for meds at end of session.      Home Living                      Prior Function            PT Goals (current goals can now be found in the care plan section) Acute Rehab PT Goals Patient Stated Goal: not stated PT Goal Formulation: With patient Time For Goal Achievement: 11/15/13 Potential to Achieve Goals: Good Progress towards PT goals: Progressing toward goals    Frequency  7X/week    PT Plan Current plan remains appropriate    Co-evaluation             End of Session Equipment Utilized During Treatment: Gait belt;Left knee immobilizer Activity Tolerance: Patient tolerated treatment well Patient left: in chair;with call bell/phone within reach     Time: 0916-0940 PT Time Calculation (min): 24 min  Charges:  $Gait Training: 8-22 mins $Therapeutic  Exercise: 8-22 mins                    G CodesCatarina Hartshorn, [place] 11/10/2013, 10:37 AM

## 2013-11-10 NOTE — Progress Notes (Signed)
Subjective: 3 Days Post-Op Procedure(s) (LRB): COMPUTER ASSISTED TOTAL KNEE ARTHROPLASTY (Left) Patient reports pain as mild.   Nausea post-op better this AM does not want any additional Phenergan states it makes her feel  loopy. Denies light headedness or dizziness this AM , sitting in chair. Objective: Vital signs in last 24 hours: Temp:  [98 F (36.7 C)-99 F (37.2 C)] 98 F (36.7 C) (07/23 0630) Pulse Rate:  [73-77] 73 (07/23 0630) Resp:  [16-20] 20 (07/23 0630) BP: (116-134)/(42-49) 116/49 mmHg (07/23 0630) SpO2:  [97 %-98 %] 97 % (07/23 0630)  Intake/Output from previous day: 07/22 0701 - 07/23 0700 In: 2762.5 [P.O.:840; I.V.:1922.5] Out: -  Intake/Output this shift:     Recent Labs  11/08/13 0454 11/09/13 0605 11/10/13 0500  HGB 11.5* 9.8* 8.2*    Recent Labs  11/09/13 0605 11/10/13 0500  WBC 8.7 6.9  RBC 3.06* 2.61*  HCT 29.0* 24.4*  PLT 156 140*    Recent Labs  11/08/13 0454  NA 132*  K 6.0*  CL 94*  CO2 28  BUN 12  CREATININE 0.67  GLUCOSE 140*  CALCIUM 8.3*   No results found for this basename: LABPT, INR,  in the last 72 hours  Neurologically intact Dorsiflexion/Plantar flexion intact Incision: dressing C/D/I Compartment soft  Assessment/Plan: 3 Days Post-Op Procedure(s) (LRB): COMPUTER ASSISTED TOTAL KNEE ARTHROPLASTY (Left) Up with therapy Hyperkalemia will monitor for any symptoms IV hep locked ( was receiving Iv fluids with added KCL 20 meq/l)  Hyponatremia will monitor Acute blood loss anemia secondary to surgery Monitor Hgb and for symptoms of anemia.   Erskine Emery 11/10/2013, 8:47 AM

## 2013-11-10 NOTE — Care Management Note (Signed)
CARE MANAGEMENT NOTE 11/10/2013  Patient:  Beverly Ballard, Beverly Ballard   Account Number:  [id number]  Date Initiated:  11/10/2013  Documentation initiated by:  Ricki Miller  Subjective/Objective Assessment:   68 yr old female s/p left total knee arthroplasty.     Action/Plan:   Case manager spoke with patient and husband concerning home health and DME needs at discharge. Choice offered. Referral called to [facility] loiasion. Patient states she has rolling walker and 3in1.   Anticipated DC Date:  11/10/2013   Anticipated DC Plan:  [place]  CM consult      [facility] Choice  HOME HEALTH   Choice offered to / List presented to:  C-1 Patient   DME arranged  NA        [place] arranged  [facility]      [place].   Status of service:  Completed, signed off Medicare Important Message given?  YES (If response is "NO", the following Medicare IM given date fields will be blank) Date Medicare IM given:  11/09/2013 Medicare IM given by:  Ricki Miller Date Additional Medicare IM given:   Additional Medicare IM given by:    Discharge Disposition:  [place]  Per UR Regulation:  Reviewed for med. necessity/level of care/duration of stay

## 2013-11-10 NOTE — Progress Notes (Signed)
Orthopedic Tech Progress Note Patient Details:  TAMATHA GADBOIS Oct 31, 1945 [id number]  Patient ID: Leanor Kail, female   DOB: 08-01-1945, 68 y.o.   MRN: [id number] Placed pt's lle in cpm @ 1455 @0 - 50 degrees  [place] Jr 11/10/2013, 2:56 PM

## 2013-11-10 NOTE — Progress Notes (Signed)
Physical Therapy Treatment Patient Details Name: Beverly Ballard MRN: [id number] DOB: 1945/11/10 Today's Date: 11/10/2013    History of Present Illness pt presents with L TKA.      PT Comments    Pt very fatigued this pm, but agreeable to amb.  Continues to have decreased nausea and improved mobility.  Will continue to follow.    Follow Up Recommendations  Home health PT;Supervision for mobility/OOB     Equipment Recommendations  None recommended by PT    Recommendations for Other Services       Precautions / Restrictions Precautions Precautions: Fall Required Braces or Orthoses: Knee Immobilizer - Left Knee Immobilizer - Left: On when out of bed or walking;Discontinue once straight leg raise with < 10 degree lag Restrictions Weight Bearing Restrictions: Yes LLE Weight Bearing: Weight bearing as tolerated    Mobility  Bed Mobility Overal bed mobility: Needs Assistance Bed Mobility: Sit to Supine     Supine to sit: Min assist Sit to supine: Min assist   General bed mobility comments: A with L LE only.    Transfers Overall transfer level: Needs assistance Equipment used: Rolling walker (2 wheeled) Transfers: Sit to/from Stand Sit to Stand: Min guard         General transfer comment: VC for hand placement and to prop LLE out when standing.  Ambulation/Gait Ambulation/Gait assistance: Min guard Ambulation Distance (Feet): 120 Feet Assistive device: Rolling walker (2 wheeled) Gait Pattern/deviations: Step-to pattern;Decreased step length - right;Decreased stance time - left     General Gait Details: pt continues to improve amb today.  cues for increasing step length on R LE and more fluid gait pattern.     Stairs            Wheelchair Mobility    Modified Rankin (Stroke Patients Only)       Balance Overall balance assessment: Needs assistance Sitting-balance support: No upper extremity supported;Feet supported Sitting balance-Leahy Scale:  Good     Standing balance support: Bilateral upper extremity supported Standing balance-Leahy Scale: Poor Standing balance comment: Relies heavily on RW for support                    Cognition Arousal/Alertness: Awake/alert Behavior During Therapy: WFL for tasks assessed/performed Overall Cognitive Status: Within Functional Limits for tasks assessed                      Exercises      General Comments        Pertinent Vitals/Pain 6/10.  Premedicated.      Home Living                      Prior Function            PT Goals (current goals can now be found in the care plan section) Acute Rehab PT Goals Patient Stated Goal: not stated PT Goal Formulation: With patient Time For Goal Achievement: 11/15/13 Potential to Achieve Goals: Good Progress towards PT goals: Progressing toward goals    Frequency  7X/week    PT Plan Current plan remains appropriate    Co-evaluation             End of Session Equipment Utilized During Treatment: Gait belt;Left knee immobilizer Activity Tolerance: Patient tolerated treatment well Patient left: in bed;with call bell/phone within reach;with family/visitor present     Time: 1327-1350 PT Time Calculation (min): 23 min  Charges:  $Gait Training:  23-37 mins                    G CodesCatarina Hartshorn, [place] 11/10/2013, 3:03 PM

## 2013-11-10 NOTE — Progress Notes (Signed)
Occupational Therapy Treatment and Discharge Patient Details Name: Beverly Ballard MRN: [id number] DOB: 29-Aug-1945 Today's Date: 11/10/2013    History of present illness pt presents with L TKA.     OT comments  Focus of session was on practicing a shower transfer to the shower seat and reviewing AE for LB ADLs. All education has been completed and pt will have 24/7 supervision to assist with ADL/IADLs as needed at home. Pt feels confident in self care tasks and therefore no further OT is needed, we will sign off.  Follow Up Recommendations  No OT follow up;Supervision/Assistance - 24 hour    Equipment Recommendations  Other (comment) (Pt has all DME needs)       Precautions / Restrictions Precautions Precautions: Fall Required Braces or Orthoses: Knee Immobilizer - Left Knee Immobilizer - Left: On when out of bed or walking;Discontinue once straight leg raise with < 10 degree lag Restrictions LLE Weight Bearing: Weight bearing as tolerated       Mobility Bed Mobility Overal bed mobility: Needs Assistance Bed Mobility: Supine to Sit;Sit to Supine     Supine to sit: Min assist Sit to supine: Min assist   General bed mobility comments: Practiced with HOB flat and A only for LLE.  Transfers Overall transfer level: Needs assistance Equipment used: Rolling walker (2 wheeled) Transfers: Sit to/from Stand Sit to Stand: Min guard         General transfer comment: VC for hand placement and to prop LLE out when standing.    Balance Overall balance assessment: Needs assistance Sitting-balance support: No upper extremity supported;Feet supported Sitting balance-Leahy Scale: Good     Standing balance support: Bilateral upper extremity supported Standing balance-Leahy Scale: Poor Standing balance comment: Relies heavily on RW for support                   ADL Overall ADL's : Needs assistance/impaired             Lower Body Bathing: Min guard;Sit to/from  stand;With adaptive equipment (using long handled sponge)       Lower Body Dressing: Sit to/from stand;Min guard;With adaptive equipment (using reacher)           Tub/ Shower Transfer: Walk-in shower;Min guard;Ambulation;Shower seat;Grab bars;Rolling walker   Functional mobility during ADLs: Rolling walker;Min guard General ADL Comments: Reviewed and practiced AE for LB ADLs, and a shower transfer to the shower seat. Recommended that husband be with her for shower transfer and encouraged her to attempt to get her shoes on everyday.          Perception     Praxis      Cognition   Behavior During Therapy: WFL for tasks assessed/performed Overall Cognitive Status: Within Functional Limits for tasks assessed                                    Pertinent Vitals/ Pain       C/o pain in LLE during tx but did not rate. Pt was repositioned back in bed with ice on L knee.         Frequency Min 2X/week     Progress Toward Goals  OT Goals(current goals can now be found in the care plan section)  Progress towards OT goals:  (All education completed)  Acute Rehab OT Goals Patient Stated Goal: not stated OT Goal Formulation: With patient Time For Goal Achievement:  11/15/13 Potential to Achieve Goals: Good  Plan Discharge plan remains appropriate       End of Session Equipment Utilized During Treatment: Rolling walker;Left knee immobilizer   Activity Tolerance Patient tolerated treatment well   Patient Left in bed;with call bell/phone within reach;with family/visitor present           Time:  -     Charges:    Lyda Perone 11/10/2013, 2:38 PM

## 2013-11-10 NOTE — Progress Notes (Signed)
I have read and agree with this note.   Time in/out: 1350-1426  Total time: 36 minutes ([place])  [place], OTR/L [phone]

## 2013-11-11 ENCOUNTER — Telehealth: Payer: Self-pay | Admitting: Family Medicine

## 2013-11-11 LAB — BASIC METABOLIC PANEL
ANION GAP: 10 (ref 5–15)
BUN: 6 mg/dL (ref 6–23)
CALCIUM: 8.3 mg/dL — AB (ref 8.4–10.5)
CHLORIDE: 97 meq/L (ref 96–112)
CO2: 29 meq/L (ref 19–32)
CREATININE: 0.54 mg/dL (ref 0.50–1.10)
GFR calc non Af Amer: >90 mL/min (ref >90)
Glucose, Bld: 104 mg/dL — ABNORMAL HIGH (ref 70–99)
Potassium: 4.6 mEq/L (ref 3.7–5.3)
SODIUM: 136 meq/L — AB (ref 137–147)

## 2013-11-11 LAB — HEMOGLOBIN AND HEMATOCRIT, BLOOD
HCT: 25.7 % — ABNORMAL LOW (ref 36.0–46.0)
HEMOGLOBIN: 8.5 g/dL — AB (ref 12.0–15.0)

## 2013-11-11 NOTE — Progress Notes (Signed)
Physical Therapy Treatment Patient Details Name: Beverly Ballard MRN: [id number] DOB: 16-Mar-1946 Today's Date: 11/11/2013    History of Present Illness pt presents with L TKA.      PT Comments    Pt moving well today and plan for D/C to home today.  Pt ready for D/C from PT stand point.    Follow Up Recommendations  Home health PT;Supervision for mobility/OOB     Equipment Recommendations  None recommended by PT    Recommendations for Other Services       Precautions / Restrictions Precautions Precautions: Fall Required Braces or Orthoses: Knee Immobilizer - Left Knee Immobilizer - Left: On when out of bed or walking;Discontinue once straight leg raise with < 10 degree lag Restrictions Weight Bearing Restrictions: Yes LLE Weight Bearing: Weight bearing as tolerated    Mobility  Bed Mobility                  Transfers Overall transfer level: Needs assistance Equipment used: Rolling walker (2 wheeled) Transfers: Sit to/from Stand Sit to Stand: Supervision         General transfer comment: pt demos good use of UEs.  .    Ambulation/Gait Ambulation/Gait assistance: Supervision Ambulation Distance (Feet): 250 Feet Assistive device: Rolling walker (2 wheeled) Gait Pattern/deviations: Step-through pattern;Decreased step length - right;Decreased stance time - left     General Gait Details: pt with improved fluidity of gait and able to begin stepping R LE past L LE.     Stairs Stairs: Yes Stairs assistance: Min assist Stair Management: No rails;Step to pattern;Backwards;With walker Number of Stairs: 2 General stair comments: cues for stairs sequencing and safe technique.    Wheelchair Mobility    Modified Rankin (Stroke Patients Only)       Balance                                    Cognition Arousal/Alertness: Awake/alert Behavior During Therapy: WFL for tasks assessed/performed Overall Cognitive Status: Within Functional Limits  for tasks assessed                      Exercises Total Joint Exercises Long Arc Quad: AROM;Left;10 reps Knee Flexion: AROM;Left;10 reps Goniometric ROM: ~8 - 65    General Comments        Pertinent Vitals/Pain 4/10.  Premedicated.      Home Living                      Prior Function            PT Goals (current goals can now be found in the care plan section) Acute Rehab PT Goals Patient Stated Goal: not stated PT Goal Formulation: With patient Time For Goal Achievement: 11/15/13 Potential to Achieve Goals: Good Progress towards PT goals: Progressing toward goals    Frequency  7X/week    PT Plan Current plan remains appropriate    Co-evaluation             End of Session Equipment Utilized During Treatment: Gait belt;Left knee immobilizer Activity Tolerance: Patient tolerated treatment well Patient left: in chair;with call bell/phone within reach     Time: 0857-0921 PT Time Calculation (min): 24 min  Charges:  $Gait Training: 8-22 mins $Therapeutic Exercise: 8-22 mins  G CodesCatarina Hartshorn, [place] 11/11/2013, 11:41 AM

## 2013-11-11 NOTE — Telephone Encounter (Signed)
I really can't tell what to do with her blood pressure without knowing what her blood pressure is. It looks like BP was on the low side in the hospital and at discharge. If her doctors discharging her felt she needed to hold it for now then that is likely best. Can she see [place] early next week to check?

## 2013-11-11 NOTE — Telephone Encounter (Signed)
Can she come in to recheck BP and help decide - we have 4pm appt availabe?

## 2013-11-11 NOTE — Telephone Encounter (Signed)
I called the pt and she stated she is not able to come in today as she just got home from the hospital and questioned if she could just take 1/2 of the BP pill? Message forwarded to Dr Maudie Mercury.

## 2013-11-11 NOTE — Telephone Encounter (Signed)
Pt would like to speak w/ dr Maudie Mercury about her bp med.  Pt just had knee surgery and states her bp fluctuated in the hospital.  They did not want to give her the bp med due to being a fall risk. Pt has no way to check her bp. Pt has not restarted her med. States she just wants to speak w/ dr Maudie Mercury about this.

## 2013-11-11 NOTE — Telephone Encounter (Signed)
I called the pt and informed her of the message below and I scheduled her to come in on Monday at 10am for a BP check.  She checked her BP with a cuff belonging to her neighbor while we were on the phone and the reading was 140/55-pulse 77 and this was forwarded to Dr Maudie Mercury for review.

## 2013-11-14 ENCOUNTER — Encounter: Payer: Self-pay | Admitting: Family Medicine

## 2013-11-14 ENCOUNTER — Ambulatory Visit (INDEPENDENT_AMBULATORY_CARE_PROVIDER_SITE_OTHER): Payer: Medicare HMO | Admitting: Family Medicine

## 2013-11-14 ENCOUNTER — Ambulatory Visit: Payer: Medicare HMO | Admitting: *Deleted

## 2013-11-14 VITALS — BP 128/72 | HR 81 | Temp 97.4°F | Ht 66.0 in

## 2013-11-14 DIAGNOSIS — I10 Essential (primary) hypertension: Secondary | ICD-10-CM

## 2013-11-14 DIAGNOSIS — D62 Acute posthemorrhagic anemia: Secondary | ICD-10-CM

## 2013-11-14 MED ORDER — AMLODIPINE BESYLATE 5 MG PO TABS: 5.0000 mg | ORAL_TABLET | Freq: Every day | ORAL | Status: DC

## 2013-11-14 NOTE — Patient Instructions (Signed)
-  in two weeks start check blood pressure 3 times per week and if running >140/90 on average restart norvasc at lower dose of 5mg

## 2013-11-14 NOTE — Discharge Summary (Signed)
Physician Discharge Summary  Patient ID: TYKERRIA MCCUBBINS MRN: [id number] DOB/AGE: Jan 31, 1946 68 y.o.  Admit date: 11/07/2013 Discharge date: 11/11/2013  Admission Diagnoses:  Osteoarthritis of left knee  Discharge Diagnoses:  Principal Problem:   Osteoarthritis of left knee   Past Medical History  Diagnosis Date  . ALLERGIC RHINITIS 05/27/2007    uses Flonase daily as needed and ALbuterol inhaler prn;gets Allergy shots  . ANXIETY 12/19/2009  . ASTHMATIC BRONCHITIS, ACUTE 59yrs ago  . LOW BACK PAIN 05/27/2007  . Essential hypertension, benign 06/26/2009    takes Amlodipine daily  . HYPERTENSION 07/03/2009  . Hyperlipidemia     takes Pravastatin daily  . DEPRESSION 12/19/2009    takes Zoloft daily  . History of migraine many yrs ago  . SEIZURE DISORDER 7 yrs ago was last one    takes Tegretol daily  . Numbness     left toes   . Arthritis   . Joint pain   . Joint swelling   . History of gout   . GERD 05/27/2007    takes Nexium daily  . Constipation   . Diverticulosis   . Hyperthyroidism     takes Synthroid daily  . Insomnia     but doesn't take any meds    Surgeries: Procedure(s): COMPUTER ASSISTED LEFT TOTAL KNEE ARTHROPLASTY on 11/07/2013   Consultants (if any):  none  Discharged Condition: Improved  Hospital Course: NICOLA HEINEMANN is an 68 y.o. female who was admitted 11/07/2013 with a diagnosis of Osteoarthritis of left knee and went to the operating room on 11/07/2013 and underwent the above named procedures.    She was given perioperative antibiotics:      Anti-infectives   Start     Dose/Rate Route Frequency Ordered Stop   11/07/13 0600  ceFAZolin (ANCEF) IVPB 2 g/50 mL premix     2 g 100 mL/hr over 30 Minutes Intravenous On call to O.R. 11/06/13 1358 11/07/13 1428    Pt developed refractory nausea post op and this slowed her progress significantly.  She experienced hyperkalemia, hyponatremia, and acute blood loss anemia which were treated and stable at  discharge. Blood pressure medications held secondary to mild decrease in BP with use of narcotics and antiemetics.   Pt stable for discharge once nausea resolved and pt independent with ambulation.  She was given sequential compression devices, early ambulation, and ASPIRIN for DVT prophylaxis.  She benefited maximally from the hospital stay and there were no complications.    Recent vital signs:  Filed Vitals:   11/11/13 0500  BP: 118/51  Pulse: 69  Temp: 99.2 F (37.3 C)  Resp: 20    Recent laboratory studies:  Lab Results  Component Value Date   HGB 8.5* 11/11/2013   HGB 8.2* 11/10/2013   HGB 9.8* 11/09/2013   Lab Results  Component Value Date   WBC 6.9 11/10/2013   PLT 140* 11/10/2013   Lab Results  Component Value Date   INR 1.12 11/01/2013   Lab Results  Component Value Date   NA 136* 11/11/2013   K 4.6 11/11/2013   CL 97 11/11/2013   CO2 29 11/11/2013   BUN 6 11/11/2013   CREATININE 0.54 11/11/2013   GLUCOSE 104* 11/11/2013    Discharge Medications:     Medication List    STOP taking these medications       aspirin 81 MG tablet  Replaced by:  aspirin EC 325 MG tablet      TAKE these medications  albuterol 108 (90 BASE) MCG/ACT inhaler  Commonly known as:  PROVENTIL HFA;VENTOLIN HFA  Inhale 1 puff into the lungs every 6 (six) hours as needed for wheezing or shortness of breath.     aspirin EC 325 MG tablet  Take 1 tablet (325 mg total) by mouth daily.     Calcium-Vitamin D 600-200 MG-UNIT Caps  Take by mouth 2 (two) times daily.     carbamazepine 200 MG tablet  Commonly known as:  TEGRETOL  Take 400 mg by mouth 2 (two) times daily.     esomeprazole 20 MG capsule  Commonly known as:  NEXIUM  Take 20 mg by mouth daily before breakfast.     FISH OIL PO  Take by mouth daily.     fluticasone 50 MCG/ACT nasal spray  Commonly known as:  FLONASE  Place 2 sprays into the nose daily.     levothyroxine 175 MCG tablet  Commonly known as:   SYNTHROID, LEVOTHROID  Take 175 mcg by mouth daily.     methocarbamol 500 MG tablet  Commonly known as:  ROBAXIN  Take 1 tablet (500 mg total) by mouth every 6 (six) hours as needed for muscle spasms (spasm).     multivitamin capsule  Take 1 capsule by mouth daily.     oxyCODONE-acetaminophen 5-325 MG per tablet  Commonly known as:  ROXICET  Take 1-2 tablets by mouth every 4 (four) hours as needed for severe pain.     pravastatin 20 MG tablet  Commonly known as:  PRAVACHOL  TAKE 1 TABLET BY MOUTH EVERY DAY     sertraline 100 MG tablet  Commonly known as:  ZOLOFT  Take 100 mg by mouth daily.     VITAMIN B 12 PO  Take by mouth daily.        Diagnostic Studies: Dg Chest 2 View  11/01/2013   CLINICAL DATA:  Preop for left knee osteoarthritis.  EXAM: CHEST  2 VIEW  COMPARISON:  12/15/2010  FINDINGS: Midline trachea. Mild cardiomegaly. Mediastinal contours otherwise within normal limits. No pleural effusion or pneumothorax. Diffuse peribronchial thickening. No lobar consolidation. No congestive failure.  IMPRESSION: No acute cardiopulmonary disease.  Cardiomegaly without congestive failure.  Peribronchial thickening which may relate to chronic bronchitis or smoking.   Electronically Signed   By: Abigail Miyamoto M.D.   On: 11/01/2013 14:33    Disposition: 06-Home-Health Care Svc  Discharge Instructions   Call MD / Call 911    Complete by:  As directed   If you experience chest pain or shortness of breath, CALL 911 and be transported to the hospital emergency room.  If you develope a fever above 101 F, pus (white drainage) or increased drainage or redness at the wound, or calf pain, call your surgeon's office.     Constipation Prevention    Complete by:  As directed   Drink plenty of fluids.  Prune juice may be helpful.  You may use a stool softener, such as Colace (over the counter) 100 mg twice a day.  Use MiraLax (over the counter) for constipation as needed.     Diet - low sodium  heart healthy    Complete by:  As directed      Full weight bearing    Complete by:  As directed      Increase activity slowly as tolerated    Complete by:  As directed          Pt given a prescription for Zofran  for nausea.  This was picked up at Dr Lorin Mercy office.  Follow-up Information   Follow up with Marybelle Killings, MD. Schedule an appointment as soon as possible for a visit in 2 weeks.   Specialty:  Orthopedic Surgery   Contact information:   [place] [address] [phone]       Follow up with [place]. (Someone from [facility] will contact you concerning start date and time for physical therapy.)    Contact information:   [address] [place] [address] [phone]        Signed: MALEA, SWILLING 11/14/2013, 3:01 PM

## 2013-11-14 NOTE — Progress Notes (Signed)
No chief complaint on file.   HPI:  Acute visit for:  1)HTN: -recent hospitalization for knee replacement and norvasc held in hospital for lower BPs - never actually that low, but not high either -reports: doing ok, recovering from surgery, no dizziness, not taking the BP medication -denies: CP, SOB, dizziness, falls   ROS: See pertinent positives and negatives per HPI.  Past Medical History  Diagnosis Date  . ALLERGIC RHINITIS 05/27/2007    uses Flonase daily as needed and ALbuterol inhaler prn;gets Allergy shots  . ANXIETY 12/19/2009  . ASTHMATIC BRONCHITIS, ACUTE 30yrs ago  . LOW BACK PAIN 05/27/2007  . Essential hypertension, benign 06/26/2009    takes Amlodipine daily  . HYPERTENSION 07/03/2009  . Hyperlipidemia     takes Pravastatin daily  . DEPRESSION 12/19/2009    takes Zoloft daily  . History of migraine many yrs ago  . SEIZURE DISORDER 7 yrs ago was last one    takes Tegretol daily  . Numbness     left toes   . Arthritis   . Joint pain   . Joint swelling   . History of gout   . GERD 05/27/2007    takes Nexium daily  . Constipation   . Diverticulosis   . Hyperthyroidism     takes Synthroid daily  . Insomnia     but doesn't take any meds    Past Surgical History  Procedure Laterality Date  . Heel spurs    . Tonsillectomy    . Adenoidectomy    . Back surgery      x 1  . Colonoscopy    . Esophagogastroduodenoscopy    . Knee arthroplasty Left 11/07/2013    Procedure: COMPUTER ASSISTED TOTAL KNEE ARTHROPLASTY;  Surgeon: Marybelle Killings, MD;  Location: [facility];  Service: Orthopedics;  Laterality: Left;  Left Total Knee Arthroplasty, Cemented, Computer Assist    Family History  Problem Relation Age of Onset  . Arthritis Mother   . Heart murmur Other     History   Social History  . Marital Status: Married    Spouse Name: N/A    Number of Children: N/A  . Years of Education: N/A   Social History Main Topics  . Smoking status: Former Research scientist (life sciences)  . Smokeless  tobacco: Never Used     Comment: quit smokikng 1yrs ago  . Alcohol Use: No  . Drug Use: No  . Sexual Activity: No   Other Topics Concern  . None   Social History Narrative  . None    Current outpatient prescriptions:albuterol (PROVENTIL HFA;VENTOLIN HFA) 108 (90 BASE) MCG/ACT inhaler, Inhale 1 puff into the lungs every 6 (six) hours as needed for wheezing or shortness of breath., Disp: , Rfl: ;  amLODipine (NORVASC) 5 MG tablet, Take 1 tablet (5 mg total) by mouth daily., Disp: 90 tablet, Rfl: 3;  aspirin EC 325 MG tablet, Take 1 tablet (325 mg total) by mouth daily., Disp: 30 tablet, Rfl: 0 Calcium Carbonate-Vitamin D (CALCIUM-VITAMIN D) 600-200 MG-UNIT CAPS, Take by mouth 2 (two) times daily.  , Disp: , Rfl: ;  carbamazepine (TEGRETOL) 200 MG tablet, Take 400 mg by mouth 2 (two) times daily. , Disp: , Rfl: ;  Cyanocobalamin (VITAMIN B 12 PO), Take by mouth daily.  , Disp: , Rfl: ;  esomeprazole (NEXIUM) 20 MG capsule, Take 20 mg by mouth daily before breakfast., Disp: , Rfl:  fluticasone (FLONASE) 50 MCG/ACT nasal spray, Place 2 sprays into the nose daily.,  Disp: 16 g, Rfl: 6;  levothyroxine (SYNTHROID, LEVOTHROID) 175 MCG tablet, Take 175 mcg by mouth daily.  , Disp: , Rfl: ;  methocarbamol (ROBAXIN) 500 MG tablet, Take 1 tablet (500 mg total) by mouth every 6 (six) hours as needed for muscle spasms (spasm)., Disp: 30 tablet, Rfl: 0 Multiple Vitamin (MULTIVITAMIN) capsule, Take 1 capsule by mouth daily.  , Disp: , Rfl: ;  Omega-3 Fatty Acids (FISH OIL PO), Take by mouth daily.  , Disp: , Rfl: ;  oxyCODONE-acetaminophen (ROXICET) 5-325 MG per tablet, Take 1-2 tablets by mouth every 4 (four) hours as needed for severe pain., Disp: 60 tablet, Rfl: 0;  pravastatin (PRAVACHOL) 20 MG tablet, TAKE 1 TABLET BY MOUTH EVERY DAY, Disp: 90 tablet, Rfl: 3 sertraline (ZOLOFT) 100 MG tablet, Take 100 mg by mouth daily., Disp: , Rfl:   EXAM:  Filed Vitals:   11/14/13 1017  BP: 128/72  Pulse: 81  Temp:  97.4 F (36.3 C)    Body mass index is 0.00 kg/(m^2).  GENERAL: vitals reviewed and listed above, alert, oriented, appears well hydrated and in no acute distress  HEENT: atraumatic, conjunttiva clear, no obvious abnormalities on inspection of external nose and ears  NECK: no obvious masses on inspection  LUNGS: clear to auscultation bilaterally, no wheezes, rales or rhonchi, good air movement  CV: HRRR, no peripheral edema  MS: moves all extremities without noticeable abnormality  PSYCH: pleasant and cooperative, no obvious depression or anxiety  ASSESSMENT AND PLAN:  Discussed the following assessment and plan:  HYPERTENSION -hold norvasc for two weeks, then check 3 times per week at home and if running >140/90 on average restart   Acute blood loss anemia -improving on discharge, daily iron  -Patient advised to return or notify a doctor immediately if symptoms worsen or persist or new concerns arise.  Patient Instructions  -in two weeks start check blood pressure 3 times per week and if running >140/90 on average restart norvasc at lower dose of 5mg      KIM, HANNAH R.

## 2013-11-14 NOTE — Progress Notes (Signed)
Pre visit review using our clinic review tool, if applicable. No additional management support is needed unless otherwise documented below in the visit note. 

## 2014-01-13 ENCOUNTER — Other Ambulatory Visit (HOSPITAL_COMMUNITY): Payer: Self-pay | Admitting: *Deleted

## 2014-01-13 ENCOUNTER — Ambulatory Visit (INDEPENDENT_AMBULATORY_CARE_PROVIDER_SITE_OTHER): Payer: Medicare HMO | Admitting: Family Medicine

## 2014-01-13 ENCOUNTER — Other Ambulatory Visit: Payer: Self-pay | Admitting: Family Medicine

## 2014-01-13 ENCOUNTER — Encounter: Payer: Self-pay | Admitting: Family Medicine

## 2014-01-13 VITALS — BP 122/80 | HR 79 | Temp 97.8°F | Ht 66.0 in | Wt 221.5 lb

## 2014-01-13 DIAGNOSIS — I10 Essential (primary) hypertension: Secondary | ICD-10-CM

## 2014-01-13 DIAGNOSIS — M7989 Other specified soft tissue disorders: Secondary | ICD-10-CM

## 2014-01-13 DIAGNOSIS — R609 Edema, unspecified: Secondary | ICD-10-CM

## 2014-01-13 DIAGNOSIS — R8290 Unspecified abnormal findings in urine: Secondary | ICD-10-CM

## 2014-01-13 DIAGNOSIS — R82998 Other abnormal findings in urine: Secondary | ICD-10-CM

## 2014-01-13 DIAGNOSIS — R600 Localized edema: Secondary | ICD-10-CM

## 2014-01-13 LAB — POCT URINALYSIS DIPSTICK
Bilirubin, UA: NEGATIVE
Glucose, UA: NEGATIVE
Ketones, UA: NEGATIVE
Nitrite, UA: NEGATIVE
Spec Grav, UA: 1.01
UROBILINOGEN UA: 0.2
pH, UA: 6.5

## 2014-01-13 MED ORDER — CIPROFLOXACIN HCL 250 MG PO TABS: 250.0000 mg | ORAL_TABLET | Freq: Two times a day (BID) | ORAL | Status: DC

## 2014-01-13 MED ORDER — HYDROCHLOROTHIAZIDE 12.5 MG PO TABS: 12.5000 mg | ORAL_TABLET | Freq: Every day | ORAL | Status: DC

## 2014-01-13 NOTE — Progress Notes (Signed)
Pre visit review using our clinic review tool, if applicable. No additional management support is needed unless otherwise documented below in the visit note. 

## 2014-01-13 NOTE — Progress Notes (Signed)
No chief complaint on file.   HPI:  Acute visit for:  1)Urine odor: -odor to urine for 2-3 days and urinary frequency -a few very brief pains in lower abd -denies: fevers, malaise, nausea, vomiting, flank pain   2)feet swollen -known hx of LE edema, venous insufficency -reports intermittent swelling on and off in bilat LEs -swelling is R >L -walking more then normal -denies: CP, DOE, SOB, palpitations -knee pain - since her surgery and feels like this is not recovering as well as she thought it should   ROS: See pertinent positives and negatives per HPI.  Past Medical History  Diagnosis Date  . ALLERGIC RHINITIS 05/27/2007    uses Flonase daily as needed and ALbuterol inhaler prn;gets Allergy shots  . ANXIETY 12/19/2009  . ASTHMATIC BRONCHITIS, ACUTE 80yrs ago  . LOW BACK PAIN 05/27/2007  . Essential hypertension, benign 06/26/2009    takes Amlodipine daily  . HYPERTENSION 07/03/2009  . Hyperlipidemia     takes Pravastatin daily  . DEPRESSION 12/19/2009    takes Zoloft daily  . History of migraine many yrs ago  . SEIZURE DISORDER 7 yrs ago was last one    takes Tegretol daily  . Numbness     left toes   . Arthritis   . Joint pain   . Joint swelling   . History of gout   . GERD 05/27/2007    takes Nexium daily  . Constipation   . Diverticulosis   . Hyperthyroidism     takes Synthroid daily  . Insomnia     but doesn't take any meds    Past Surgical History  Procedure Laterality Date  . Heel spurs    . Tonsillectomy    . Adenoidectomy    . Back surgery      x 1  . Colonoscopy    . Esophagogastroduodenoscopy    . Knee arthroplasty Left 11/07/2013    Procedure: COMPUTER ASSISTED TOTAL KNEE ARTHROPLASTY;  Surgeon: Marybelle Killings, MD;  Location: [place];  Service: Orthopedics;  Laterality: Left;  Left Total Knee Arthroplasty, Cemented, Computer Assist    Family History  Problem Relation Age of Onset  . Arthritis Mother   . Heart murmur Other     History   Social  History  . Marital Status: Married    Spouse Name: N/A    Number of Children: N/A  . Years of Education: N/A   Social History Main Topics  . Smoking status: Former Research scientist (life sciences)  . Smokeless tobacco: Never Used     Comment: quit smokikng 26yrs ago  . Alcohol Use: No  . Drug Use: No  . Sexual Activity: No   Other Topics Concern  . None   Social History Narrative  . None    Current outpatient prescriptions:albuterol (PROVENTIL HFA;VENTOLIN HFA) 108 (90 BASE) MCG/ACT inhaler, Inhale 1 puff into the lungs every 6 (six) hours as needed for wheezing or shortness of breath., Disp: , Rfl: ;  aspirin EC 325 MG tablet, Take 1 tablet (325 mg total) by mouth daily., Disp: 30 tablet, Rfl: 0;  Calcium Carbonate-Vitamin D (CALCIUM-VITAMIN D) 600-200 MG-UNIT CAPS, Take by mouth 2 (two) times daily.  , Disp: , Rfl:  carbamazepine (TEGRETOL) 200 MG tablet, Take 400 mg by mouth 2 (two) times daily. , Disp: , Rfl: ;  Cyanocobalamin (VITAMIN B 12 PO), Take by mouth daily.  , Disp: , Rfl: ;  esomeprazole (NEXIUM) 20 MG capsule, Take 20 mg by mouth daily before  breakfast., Disp: , Rfl: ;  fluticasone (FLONASE) 50 MCG/ACT nasal spray, Place 2 sprays into the nose daily., Disp: 16 g, Rfl: 6 levothyroxine (SYNTHROID, LEVOTHROID) 175 MCG tablet, Take 175 mcg by mouth daily.  , Disp: , Rfl: ;  Multiple Vitamin (MULTIVITAMIN) capsule, Take 1 capsule by mouth daily.  , Disp: , Rfl: ;  Omega-3 Fatty Acids (FISH OIL PO), Take by mouth daily.  , Disp: , Rfl: ;  pravastatin (PRAVACHOL) 20 MG tablet, TAKE 1 TABLET BY MOUTH EVERY DAY, Disp: 90 tablet, Rfl: 3;  sertraline (ZOLOFT) 100 MG tablet, Take 100 mg by mouth daily., Disp: , Rfl:  hydrochlorothiazide (HYDRODIURIL) 12.5 MG tablet, Take 1 tablet (12.5 mg total) by mouth daily., Disp: 30 tablet, Rfl: 3  EXAM:  Filed Vitals:   01/13/14 0924  BP: 122/80  Pulse: 79  Temp: 97.8 F (36.6 C)    Body mass index is 35.77 kg/(m^2).  GENERAL: vitals reviewed and listed above,  alert, oriented, appears well hydrated and in no acute distress  HEENT: atraumatic, conjunttiva clear, no obvious abnormalities on inspection of external nose and ears  NECK: no obvious masses on inspection  LUNGS: clear to auscultation bilaterally, no wheezes, rales or rhonchi, good air movement  CV: HRRR, no peripheral edema  MS: moves all extremities without noticeable abnormality  PSYCH: pleasant and cooperative, no obvious depression or anxiety  ASSESSMENT AND PLAN:  Discussed the following assessment and plan:  HYPERTENSION - Plan: hydrochlorothiazide (HYDRODIURIL) 12.5 MG tablet  Lower leg edema - Plan: US Venous Img Lower Unilateral Left -chronic, worse since her knee surgery in that leg - advised this is common after surgery, [place] to r/o dvt -dc norvasc and start diuretic instead -elevation and compression -no signs or symptoms of HF or other systemic etiolgy -close follow up  Abnormal urine odor -udip with leuks, tr bld - assistant to contact with option of empiric tx, culture pedning  -Patient advised to return or notify a doctor immediately if symptoms worsen or persist or new concerns arise.  Patient Instructions  -compression socks daily, elevation of legs above waist for 30 minutes daily  -low sodium diet  -get ultrasound of leg  -stop the NORVASC  -Start HYDROCHLORTHIAZIDE 12.5 mg once daily in the morning  -We have ordered labs or studies at this visit. It can take up to 1-2 weeks for results and processing. We will contact you with instructions IF your results are abnormal. Normal results will be released to your [facility]. If you have not heard from [place] or can not find your results in [facility] in 2 weeks please contact our office.  -follow up in 2-4 weeks     Joachim Carton R.

## 2014-01-13 NOTE — Patient Instructions (Addendum)
-  compression socks daily, elevation of legs above waist for 30 minutes daily  -low sodium diet  -get ultrasound of leg  -stop the NORVASC  -Start HYDROCHLORTHIAZIDE 12.5 mg once daily in the morning  -We have ordered labs or studies at this visit. It can take up to 1-2 weeks for results and processing. We will contact you with instructions IF your results are abnormal. Normal results will be released to your [facility]. If you have not heard from [place] or can not find your results in [facility] in 2 weeks please contact our office.  -follow up in 2-4 weeks

## 2014-01-16 ENCOUNTER — Encounter (HOSPITAL_COMMUNITY): Payer: Medicare HMO

## 2014-01-17 LAB — URINE CULTURE: Colony Count: >=100000

## 2014-01-18 MED ORDER — CIPROFLOXACIN HCL 250 MG PO TABS: 250.0000 mg | ORAL_TABLET | Freq: Two times a day (BID) | ORAL | Status: DC

## 2014-01-18 NOTE — Addendum Note (Signed)
Addended by: Lahoma Crocker A on: 01/18/2014 11:03 AM   Modules accepted: Orders

## 2014-01-24 ENCOUNTER — Encounter (HOSPITAL_COMMUNITY): Payer: Medicare HMO

## 2014-01-24 ENCOUNTER — Ambulatory Visit (INDEPENDENT_AMBULATORY_CARE_PROVIDER_SITE_OTHER): Payer: Medicare HMO | Admitting: Family Medicine

## 2014-01-24 ENCOUNTER — Encounter: Payer: Self-pay | Admitting: Family Medicine

## 2014-01-24 VITALS — BP 128/90 | HR 71 | Temp 98.2°F | Ht 65.75 in | Wt 219.5 lb

## 2014-01-24 DIAGNOSIS — I10 Essential (primary) hypertension: Secondary | ICD-10-CM

## 2014-01-24 DIAGNOSIS — E669 Obesity, unspecified: Secondary | ICD-10-CM

## 2014-01-24 DIAGNOSIS — R87618 Other abnormal cytological findings on specimens from cervix uteri: Secondary | ICD-10-CM

## 2014-01-24 DIAGNOSIS — G40909 Epilepsy, unspecified, not intractable, without status epilepticus: Secondary | ICD-10-CM

## 2014-01-24 DIAGNOSIS — J309 Allergic rhinitis, unspecified: Secondary | ICD-10-CM

## 2014-01-24 DIAGNOSIS — F418 Other specified anxiety disorders: Secondary | ICD-10-CM

## 2014-01-24 DIAGNOSIS — B977 Papillomavirus as the cause of diseases classified elsewhere: Secondary | ICD-10-CM

## 2014-01-24 DIAGNOSIS — Z0000 Encounter for general adult medical examination without abnormal findings: Secondary | ICD-10-CM

## 2014-01-24 DIAGNOSIS — E039 Hypothyroidism, unspecified: Secondary | ICD-10-CM

## 2014-01-24 DIAGNOSIS — E785 Hyperlipidemia, unspecified: Secondary | ICD-10-CM

## 2014-01-24 DIAGNOSIS — R739 Hyperglycemia, unspecified: Secondary | ICD-10-CM

## 2014-01-24 DIAGNOSIS — M8580 Other specified disorders of bone density and structure, unspecified site: Secondary | ICD-10-CM

## 2014-01-24 DIAGNOSIS — R8789 Other abnormal findings in specimens from female genital organs: Secondary | ICD-10-CM

## 2014-01-24 DIAGNOSIS — Z23 Encounter for immunization: Secondary | ICD-10-CM

## 2014-01-24 DIAGNOSIS — K219 Gastro-esophageal reflux disease without esophagitis: Secondary | ICD-10-CM

## 2014-01-24 LAB — BASIC METABOLIC PANEL
BUN: 9 mg/dL (ref 6–23)
CO2: 31 mEq/L (ref 19–32)
Calcium: 9.1 mg/dL (ref 8.4–10.5)
Chloride: 92 mEq/L — ABNORMAL LOW (ref 96–112)
Creatinine, Ser: 0.6 mg/dL (ref 0.4–1.2)
GFR: 115.13 mL/min (ref >60.00)
Glucose, Bld: 109 mg/dL — ABNORMAL HIGH (ref 70–99)
Potassium: 4.6 mEq/L (ref 3.5–5.1)
Sodium: 129 mEq/L — ABNORMAL LOW (ref 135–145)

## 2014-01-24 LAB — LIPID PANEL
CHOL/HDL RATIO: 3
CHOLESTEROL: 195 mg/dL (ref 0–200)
HDL: 63.9 mg/dL (ref >39.00)
LDL CALC: 107 mg/dL — AB (ref 0–99)
NonHDL: 131.1
TRIGLYCERIDES: 119 mg/dL (ref 0.0–149.0)
VLDL: 23.8 mg/dL (ref 0.0–40.0)

## 2014-01-24 LAB — HEMOGLOBIN A1C: Hgb A1c MFr Bld: 5.7 % (ref 4.6–6.5)

## 2014-01-24 NOTE — Progress Notes (Signed)
Medicare Annual Preventive Care Visit  (initial annual wellness or annual wellness exam)  Concerns and/or follow up today:  HTN/Venous insufficiency: -stopped norvasc and started hctz 12/2013 -reports: doing much better - swelling resolved -denies: CP, SOB, palpitations  HLD/Prediabetes: -meds: pravastatin 20mg  -denies: leg cramps or cog changes, polyuria, polydipsia -Diet and exercise:  Depression: -meds: zoloft 100mg  -denies: SI, worsening  Allergic Rhinitis: -followed by [facility] -reports has appt this month  Hypothyroidism: -managed by Dr. Wilson Singer; sees him this friday -meds: synthroid 166mcg per day -stable  Seizure Disorder:  -followed by neurology  -no seizures in 30 years  -on tegretol long term  ROS: negative for report of fevers, unintentional weight loss, vision changes, vision loss, hearing loss or change, chest pain, sob, hemoptysis, melena, hematochezia, hematuria, genital discharge or lesions, falls, bleeding or bruising, loc, thoughts of suicide or self harm, memory loss  1.) Patient-completed health risk assessment  - completed and reviewed, see scanned documentation  2.) Review of Medical History: -PMH, PSH, Family History and current specialty and care providers reviewed and updated and listed below  - see scanned in document in chart and below  Past Medical History  Diagnosis Date  . ALLERGIC RHINITIS 05/27/2007    uses Flonase daily as needed and ALbuterol inhaler prn;gets Allergy shots  . ANXIETY 12/19/2009  . ASTHMATIC BRONCHITIS, ACUTE 27yrs ago  . LOW BACK PAIN 05/27/2007  . Essential hypertension, benign 06/26/2009    takes Amlodipine daily  . HYPERTENSION 07/03/2009  . Hyperlipidemia     takes Pravastatin daily  . DEPRESSION 12/19/2009    takes Zoloft daily  . History of migraine many yrs ago  . SEIZURE DISORDER 7 yrs ago was last one    takes Tegretol daily  . Numbness     left toes   . Arthritis   . Joint pain   . Joint swelling    . History of gout   . GERD 05/27/2007    takes Nexium daily  . Constipation   . Diverticulosis   . Hyperthyroidism     takes Synthroid daily  . Insomnia     but doesn't take any meds    Past Surgical History  Procedure Laterality Date  . Heel spurs    . Tonsillectomy    . Adenoidectomy    . Back surgery      x 1  . Colonoscopy    . Esophagogastroduodenoscopy    . Knee arthroplasty Left 11/07/2013    Procedure: COMPUTER ASSISTED TOTAL KNEE ARTHROPLASTY;  Surgeon: Marybelle Killings, MD;  Location: [place];  Service: Orthopedics;  Laterality: Left;  Left Total Knee Arthroplasty, Cemented, Computer Assist    History   Social History  . Marital Status: Married    Spouse Name: N/A    Number of Children: N/A  . Years of Education: N/A   Occupational History  . Not on file.   Social History Main Topics  . Smoking status: Former Research scientist (life sciences)  . Smokeless tobacco: Never Used     Comment: quit smokikng 61yrs ago  . Alcohol Use: No  . Drug Use: No  . Sexual Activity: No   Other Topics Concern  . Not on file   Social History Narrative  . No narrative on file    The patient has a family history of  3.) Review of functional ability and level of safety:  Any difficulty hearing? NO  History of falling? NO  Any trouble with IADLs - using a phone,  using transportation, grocery shopping, preparing meals, doing housework, doing laundry, taking medications and managing money?  NO  Advance Directives? Yes  See summary of recommendations in Patient Instructions below.  4.) Physical Exam Filed Vitals:   01/24/14 0818  BP: 128/90  Pulse: 71  Temp: 98.2 F (36.8 C)   Estimated body mass index is 35.7 kg/(m^2) as calculated from the following:   Height as of this encounter: 5' 5.75" (1.67 m).   Weight as of this encounter: 219 lb 8 oz (99.565 kg).  EKG (optional): deferred  General: alert, appear well hydrated and in no acute distress  HEENT: visual acuity grossly  intact  CV: HRRR, no peripheral edema  Lungs: CTA bilaterally  Psych: pleasant and cooperative, no obvious depression or anxiety  Mini Cog: 1. Patient instructed to listen carefully and repeat the following: [place]  2. Clock drawing test was administered: NORMAL       3. Recall of three words: 3/3  Scoring:  Patient Score: NEG   See patient instructions for recommendations.  Education and counseling regarding the above review of health provided with a plan for the following: -see scanned patient completed form for further details -fall prevention strategies discussed  -healthy lifestyle discussed -importance and resources for completing advanced directives discussed -see patient instructions below for any other recommendations provided  4)The following written screening schedule of preventive measures were reviewed with assessment and plan made per below, orders and patient instructions:      AAA screening: n/a     Alcohol screening: done     Obesity Screening and counseling: done     STI screening: declined      Tobacco Screening: done       Pneumococcal (PPSV23 -one dose after 64, one before if risk factors), influenza yearly and hepatitis B vaccines (if high risk - end stage renal disease, IV drugs, homosexual men, live in home for mentally retarded, hemophilia receiving factors) ASSESSMENT/PLAN:done      Screening mammograph (yearly if >40) ASSESSMENT/PLAN: done last year      Screening Pap smear/pelvic exam (q2 years) ASSESSMENT/PLAN: n/a - sees gyn      Prostate cancer screening ASSESSMENT/PLAN: n/a      Colorectal cancer screening (FOBT yearly or flex sig q4y or colonoscopy q10y or barium enema q4y) ASSESSMENT/PLAN: done      Diabetes outpatient self-management training services ASSESSMENT/PLAN: n/a      Bone mass measurements(covered q2y if indicated - estrogen def, osteoporosis, hyperparathyroid, vertebral abnormalities, osteoporosis or  steroids) ASSESSMENT/PLAN:offered - she reports does this with gyn - declined      Screening for glaucoma(q1y if high risk - diabetes, FH, AA and > 50 or hispanic and > 65) ASSESSMENT/PLAN: sees optho      Medical nutritional therapy for individuals with diabetes or renal disease ASSESSMENT/PLAN: n/a      Cardiovascular screening blood tests (lipids q5y) ASSESSMENT/PLAN: doing today      Diabetes screening tests ASSESSMENT/PLAN: doing today   7.) Summary:  Medicare annual wellness visit, initial -risk factors and conditions per above assessment were discussed and treatment, recommendations and referrals were offered per documentation above and orders and patient instructions. -see scanned patient completed form for further details -fall prevention strategies discussed  -healthy lifestyle discussed -see patient instructions below for any other recommendations provided  Essential hypertension - Plan: Basic metabolic panel -cont hctz, check BMP today  Osteopenia, stable on dexa 2010 - followed by gyn, was supposed to  repeat dexa 2013 -advised she repeat this, she wants to discuss with her gynecologist  Hyperlipemia - Plan: Lipid Panel -continue statin  Hyperglycemia - Plan: Hemoglobin A1C  Seizure disorder - followed by Dr. Doy Hutching in Neurology  Pap smear abnormality of cervix/human papillomavirus (HPV) positive - 09/2011, followed by gyn was to repeat in 12 months -advised to schedule her pap smear and importance of this given hx of abnormal pap  Depression with anxiety  Hypothyroidism, unspecified hypothyroidism type -she reports she had labs for this last week and is seeing Dr. Wilson Singer soon  Allergic rhinitis, unspecified allergic rhinitis type  Gastroesophageal reflux disease, esophagitis presence not specified -stable  Obesity -discuss importance of healthy diet, regular exercise and weight reduction  Patient Instructions  -schedule mammogram  We recommend the  following healthy lifestyle measures: - eat a healthy diet consisting of lots of vegetables, fruits, beans, nuts, seeds, healthy meats such as white chicken and fish and whole grains.  - avoid fried foods, fast food, processed foods, sodas, red meet and other fattening foods.  - get a least 150 minutes of aerobic exercise per week.   -schedule gynecology exam with pap smear - this will be very important given you history of abnormal pap smear - also consider repeating your bone density test  -We have ordered labs or studies at this visit. It can take up to 1-2 weeks for results and processing. We will contact you with instructions IF your results are abnormal. Normal results will be released to your [facility]. If you have not heard from [place] or can not find your results in [facility] in 2 weeks please contact our office.  -Vitamin D3 1000 IU daily; 12 mg total of calcium daily - you likely get most of this from your diet  -follow up in 3 months

## 2014-01-24 NOTE — Progress Notes (Signed)
Pre visit review using our clinic review tool, if applicable. No additional management support is needed unless otherwise documented below in the visit note. 

## 2014-01-24 NOTE — Patient Instructions (Signed)
-  schedule mammogram  We recommend the following healthy lifestyle measures: - eat a healthy diet consisting of lots of vegetables, fruits, beans, nuts, seeds, healthy meats such as white chicken and fish and whole grains.  - avoid fried foods, fast food, processed foods, sodas, red meet and other fattening foods.  - get a least 150 minutes of aerobic exercise per week.   -schedule gynecology exam with pap smear - this will be very important given you history of abnormal pap smear - also consider repeating your bone density test  -We have ordered labs or studies at this visit. It can take up to 1-2 weeks for results and processing. We will contact you with instructions IF your results are abnormal. Normal results will be released to your [facility]. If you have not heard from [place] or can not find your results in [facility] in 2 weeks please contact our office.  -Vitamin D3 1000 IU daily; 12 mg total of calcium daily - you likely get most of this from your diet  -follow up in 3 months

## 2014-01-25 ENCOUNTER — Telehealth: Payer: Self-pay | Admitting: Family Medicine

## 2014-01-25 MED ORDER — LISINOPRIL 10 MG PO TABS: 10.0000 mg | ORAL_TABLET | Freq: Every day | ORAL | Status: DC

## 2014-01-25 NOTE — Addendum Note (Signed)
Addended by: Agnes Lawrence on: 01/25/2014 08:42 AM   Modules accepted: Orders

## 2014-01-25 NOTE — Addendum Note (Signed)
Addended by: Agnes Lawrence on: 01/25/2014 08:41 AM   Modules accepted: Orders

## 2014-01-25 NOTE — Telephone Encounter (Signed)
emmi emailed °

## 2014-02-03 ENCOUNTER — Other Ambulatory Visit (HOSPITAL_COMMUNITY): Payer: Self-pay | Admitting: Obstetrics & Gynecology

## 2014-02-03 DIAGNOSIS — Z1231 Encounter for screening mammogram for malignant neoplasm of breast: Secondary | ICD-10-CM

## 2014-02-08 ENCOUNTER — Other Ambulatory Visit: Payer: Medicare HMO

## 2014-02-10 ENCOUNTER — Ambulatory Visit (HOSPITAL_COMMUNITY)
Admission: RE | Admit: 2014-02-10 | Discharge: 2014-02-10 | Disposition: A | Discharge status: Home / Self Care | Payer: Medicare HMO | Source: Ambulatory Visit | Attending: Obstetrics & Gynecology | Admitting: Obstetrics & Gynecology

## 2014-02-10 DIAGNOSIS — Z1231 Encounter for screening mammogram for malignant neoplasm of breast: Secondary | ICD-10-CM | POA: Insufficient documentation

## 2014-02-20 ENCOUNTER — Encounter: Payer: Self-pay | Admitting: Family Medicine

## 2014-02-24 ENCOUNTER — Ambulatory Visit (INDEPENDENT_AMBULATORY_CARE_PROVIDER_SITE_OTHER): Payer: Medicare HMO | Admitting: Family Medicine

## 2014-02-24 ENCOUNTER — Encounter: Payer: Self-pay | Admitting: Family Medicine

## 2014-02-24 VITALS — BP 110/80 | HR 68 | Temp 98.5°F | Ht 65.75 in | Wt 221.9 lb

## 2014-02-24 DIAGNOSIS — E871 Hypo-osmolality and hyponatremia: Secondary | ICD-10-CM

## 2014-02-24 DIAGNOSIS — I10 Essential (primary) hypertension: Secondary | ICD-10-CM

## 2014-02-24 DIAGNOSIS — F32A Depression, unspecified: Secondary | ICD-10-CM

## 2014-02-24 DIAGNOSIS — F329 Major depressive disorder, single episode, unspecified: Secondary | ICD-10-CM

## 2014-02-24 LAB — BASIC METABOLIC PANEL
BUN: 11 mg/dL (ref 6–23)
CALCIUM: 9.1 mg/dL (ref 8.4–10.5)
CHLORIDE: 102 meq/L (ref 96–112)
CO2: 26 mEq/L (ref 19–32)
Creatinine, Ser: 0.6 mg/dL (ref 0.4–1.2)
GFR: 104.28 mL/min (ref >60.00)
Glucose, Bld: 82 mg/dL (ref 70–99)
Potassium: 4.4 mEq/L (ref 3.5–5.1)
Sodium: 138 mEq/L (ref 135–145)

## 2014-02-24 NOTE — Progress Notes (Signed)
Pre visit review using our clinic review tool, if applicable. No additional management support is needed unless otherwise documented below in the visit note. 

## 2014-02-24 NOTE — Progress Notes (Signed)
HPI:   Follow up Hyponatremia, Depression, HTN:  DC'd hctz and started lisinopril She wanted to continue zoloft Denies: fatigue, weakness, tremors, CP, SOB, swelling  ROS: See pertinent positives and negatives per HPI.  Past Medical History  Diagnosis Date  . ALLERGIC RHINITIS 05/27/2007    uses Flonase daily as needed and ALbuterol inhaler prn;gets Allergy shots  . ANXIETY 12/19/2009  . ASTHMATIC BRONCHITIS, ACUTE 34yrs ago  . LOW BACK PAIN 05/27/2007  . Essential hypertension, benign 06/26/2009    takes Amlodipine daily  . HYPERTENSION 07/03/2009  . Hyperlipidemia     takes Pravastatin daily  . DEPRESSION 12/19/2009    takes Zoloft daily  . History of migraine many yrs ago  . SEIZURE DISORDER 7 yrs ago was last one    takes Tegretol daily  . Numbness     left toes   . Arthritis   . Joint pain   . Joint swelling   . History of gout   . GERD 05/27/2007    takes Nexium daily  . Constipation   . Diverticulosis   . Hyperthyroidism     takes Synthroid daily  . Insomnia     but doesn't take any meds    Past Surgical History  Procedure Laterality Date  . Heel spurs    . Tonsillectomy    . Adenoidectomy    . Back surgery      x 1  . Colonoscopy    . Esophagogastroduodenoscopy    . Knee arthroplasty Left 11/07/2013    Procedure: COMPUTER ASSISTED TOTAL KNEE ARTHROPLASTY;  Surgeon: Marybelle Killings, MD;  Location: [place];  Service: Orthopedics;  Laterality: Left;  Left Total Knee Arthroplasty, Cemented, Computer Assist    Family History  Problem Relation Age of Onset  . Arthritis Mother   . Heart murmur Other     History   Social History  . Marital Status: Married    Spouse Name: N/A    Number of Children: N/A  . Years of Education: N/A   Social History Main Topics  . Smoking status: Former Research scientist (life sciences)  . Smokeless tobacco: Never Used     Comment: quit smokikng 47yrs ago  . Alcohol Use: No  . Drug Use: No  . Sexual Activity: No   Other Topics Concern  . None    Social History Narrative    Current outpatient prescriptions: albuterol (PROVENTIL HFA;VENTOLIN HFA) 108 (90 BASE) MCG/ACT inhaler, Inhale 1 puff into the lungs every 6 (six) hours as needed for wheezing or shortness of breath., Disp: , Rfl: ;  aspirin EC 325 MG tablet, Take 1 tablet (325 mg total) by mouth daily., Disp: 30 tablet, Rfl: 0;  carbamazepine (TEGRETOL) 200 MG tablet, Take 400 mg by mouth 2 (two) times daily. , Disp: , Rfl:  Cyanocobalamin (VITAMIN B 12 PO), Take by mouth daily.  , Disp: , Rfl: ;  esomeprazole (NEXIUM) 20 MG capsule, Take 20 mg by mouth daily before breakfast., Disp: , Rfl: ;  fluticasone (FLONASE) 50 MCG/ACT nasal spray, Place 2 sprays into the nose daily., Disp: 16 g, Rfl: 6;  levothyroxine (SYNTHROID, LEVOTHROID) 175 MCG tablet, Take 175 mcg by mouth daily.  , Disp: , Rfl:  lisinopril (PRINIVIL,ZESTRIL) 10 MG tablet, Take 1 tablet (10 mg total) by mouth daily., Disp: 30 tablet, Rfl: 2;  Multiple Vitamin (MULTIVITAMIN) capsule, Take 1 capsule by mouth daily.  , Disp: , Rfl: ;  Omega-3 Fatty Acids (FISH OIL PO), Take by mouth daily.  ,  Disp: , Rfl: ;  pravastatin (PRAVACHOL) 20 MG tablet, TAKE 1 TABLET BY MOUTH EVERY DAY, Disp: 90 tablet, Rfl: 3 sertraline (ZOLOFT) 100 MG tablet, Take 100 mg by mouth daily., Disp: , Rfl:   EXAM:  Filed Vitals:   02/24/14 0958  BP: 110/80  Pulse: 68  Temp: 98.5 F (36.9 C)    Body mass index is 36.09 kg/(m^2).  GENERAL: vitals reviewed and listed above, alert, oriented, appears well hydrated and in no acute distress  HEENT: atraumatic, conjunttiva clear, no obvious abnormalities on inspection of external nose and ears  NECK: no obvious masses on inspection  LUNGS: clear to auscultation bilaterally, no wheezes, rales or rhonchi, good air movement  CV: HRRR, no peripheral edema  MS: moves all extremities without noticeable abnormality  PSYCH: pleasant and cooperative, no obvious depression or anxiety  ASSESSMENT AND  PLAN:  Discussed the following assessment and plan:  Hyponatremia - Plan: Basic metabolic panel  Essential hypertension  Depression  -BP ok on lisinopril -check BMP  -Patient advised to return or notify a doctor immediately if symptoms worsen or persist or new concerns arise.  There are no Patient Instructions on file for this visit.   Colin Benton R.

## 2014-03-24 ENCOUNTER — Encounter: Payer: Self-pay | Admitting: Family Medicine

## 2014-03-24 ENCOUNTER — Ambulatory Visit (INDEPENDENT_AMBULATORY_CARE_PROVIDER_SITE_OTHER): Payer: Medicare HMO | Admitting: Family Medicine

## 2014-03-24 VITALS — BP 118/78 | HR 73 | Temp 98.1°F | Ht 65.75 in | Wt 219.8 lb

## 2014-03-24 DIAGNOSIS — J069 Acute upper respiratory infection, unspecified: Secondary | ICD-10-CM

## 2014-03-24 NOTE — Progress Notes (Signed)
HPI:  URI: -started: about 3-4 days ago -symptoms:nasal congestion, sore throat, cough, body aches, sneezing, ear pain, mild nausea, mild body aches -denies:fever, SOB, NVD, tooth pain -has tried: allergy med OTC -sick contacts/travel/risks: denies flu exposure, tick exposure or or Ebola risks - granddaughter with a cold -Hx of: allergies  ROS: See pertinent positives and negatives per HPI.  Past Medical History  Diagnosis Date  . ALLERGIC RHINITIS 05/27/2007    uses Flonase daily as needed and ALbuterol inhaler prn;gets Allergy shots  . ANXIETY 12/19/2009  . ASTHMATIC BRONCHITIS, ACUTE 78yrs ago  . LOW BACK PAIN 05/27/2007  . Essential hypertension, benign 06/26/2009    takes Amlodipine daily  . HYPERTENSION 07/03/2009  . Hyperlipidemia     takes Pravastatin daily  . DEPRESSION 12/19/2009    takes Zoloft daily  . History of migraine many yrs ago  . SEIZURE DISORDER 7 yrs ago was last one    takes Tegretol daily  . Numbness     left toes   . Arthritis   . Joint pain   . Joint swelling   . History of gout   . GERD 05/27/2007    takes Nexium daily  . Constipation   . Diverticulosis   . Hyperthyroidism     takes Synthroid daily  . Insomnia     but doesn't take any meds    Past Surgical History  Procedure Laterality Date  . Heel spurs    . Tonsillectomy    . Adenoidectomy    . Back surgery      x 1  . Colonoscopy    . Esophagogastroduodenoscopy    . Knee arthroplasty Left 11/07/2013    Procedure: COMPUTER ASSISTED TOTAL KNEE ARTHROPLASTY;  Surgeon: Marybelle Killings, MD;  Location: [facility];  Service: Orthopedics;  Laterality: Left;  Left Total Knee Arthroplasty, Cemented, Computer Assist    Family History  Problem Relation Age of Onset  . Arthritis Mother   . Heart murmur Other     History   Social History  . Marital Status: Married    Spouse Name: N/A    Number of Children: N/A  . Years of Education: N/A   Social History Main Topics  . Smoking status: Former  Research scientist (life sciences)  . Smokeless tobacco: Never Used     Comment: quit smokikng 75yrs ago  . Alcohol Use: No  . Drug Use: No  . Sexual Activity: No   Other Topics Concern  . None   Social History Narrative    Current outpatient prescriptions: albuterol (PROVENTIL HFA;VENTOLIN HFA) 108 (90 BASE) MCG/ACT inhaler, Inhale 1 puff into the lungs every 6 (six) hours as needed for wheezing or shortness of breath., Disp: , Rfl: ;  aspirin EC 325 MG tablet, Take 1 tablet (325 mg total) by mouth daily., Disp: 30 tablet, Rfl: 0;  carbamazepine (TEGRETOL) 200 MG tablet, Take 400 mg by mouth 2 (two) times daily. , Disp: , Rfl:  Cyanocobalamin (VITAMIN B 12 PO), Take by mouth daily.  , Disp: , Rfl: ;  esomeprazole (NEXIUM) 20 MG capsule, Take 20 mg by mouth daily before breakfast., Disp: , Rfl: ;  fluticasone (FLONASE) 50 MCG/ACT nasal spray, Place 2 sprays into the nose daily., Disp: 16 g, Rfl: 6;  levothyroxine (SYNTHROID, LEVOTHROID) 175 MCG tablet, Take 175 mcg by mouth daily.  , Disp: , Rfl:  lisinopril (PRINIVIL,ZESTRIL) 10 MG tablet, Take 1 tablet (10 mg total) by mouth daily., Disp: 30 tablet, Rfl: 2;  Multiple  Vitamin (MULTIVITAMIN) capsule, Take 1 capsule by mouth daily.  , Disp: , Rfl: ;  Omega-3 Fatty Acids (FISH OIL PO), Take by mouth daily.  , Disp: , Rfl: ;  pravastatin (PRAVACHOL) 20 MG tablet, TAKE 1 TABLET BY MOUTH EVERY DAY, Disp: 90 tablet, Rfl: 3 sertraline (ZOLOFT) 100 MG tablet, Take 100 mg by mouth daily., Disp: , Rfl:   EXAM:  Filed Vitals:   03/24/14 1106  BP: 118/78  Pulse: 73  Temp: 98.1 F (36.7 C)    Body mass index is 35.75 kg/(m^2).  GENERAL: vitals reviewed and listed above, alert, oriented, appears well hydrated and in no acute distress  HEENT: atraumatic, conjunttiva clear, no obvious abnormalities on inspection of external nose and ears, normal appearance of ear canals and TMs, clear nasal congestion, mild post oropharyngeal erythema with PND, no tonsillar edema or exudate, no  sinus TTP  NECK: no obvious masses on inspection  LUNGS: clear to auscultation bilaterally, no wheezes, rales or rhonchi, good air movement  CV: HRRR, no peripheral edema  MS: moves all extremities without noticeable abnormality  PSYCH: pleasant and cooperative, no obvious depression or anxiety  ASSESSMENT AND PLAN:  Discussed the following assessment and plan:  Acute upper respiratory infection  -given HPI and exam findings today, a serious infection or illness is unlikely. We discussed potential etiologies, with VURI being most likely, and advised supportive care and monitoring. We discussed treatment side effects, likely course, antibiotic misuse, transmission, and signs of developing a serious illness. -of course, we advised to return or notify a doctor immediately if symptoms worsen or persist or new concerns arise.    Patient Instructions  INSTRUCTIONS FOR UPPER RESPIRATORY INFECTION:  -plenty of rest and fluids  -nasal saline wash 2-3 times daily (use prepackaged nasal saline or bottled/distilled water if making your own)   -can use AFRIN nasal spray for drainage and nasal congestion - but do NOT use longer then 3-4 days  -can use tylenol or ibuprofen as directed for aches and sorethroat  -in the winter time, using a humidifier at night is helpful (please follow cleaning instructions)  -if you are taking a cough medication - use only as directed, may also try a teaspoon of honey to coat the throat and throat lozenges  -for sore throat, salt water gargles can help  -follow up if you have fevers, facial pain, tooth pain, difficulty breathing or are worsening or not getting better in 5-7 days      Ethyl Vila R.

## 2014-03-24 NOTE — Patient Instructions (Signed)

## 2014-03-24 NOTE — Progress Notes (Signed)
Pre visit review using our clinic review tool, if applicable. No additional management support is needed unless otherwise documented below in the visit note. 

## 2014-04-23 ENCOUNTER — Other Ambulatory Visit: Payer: Self-pay | Admitting: Family Medicine

## 2014-04-24 ENCOUNTER — Encounter: Payer: Self-pay | Admitting: Family Medicine

## 2014-04-24 ENCOUNTER — Ambulatory Visit: Payer: Medicare HMO | Admitting: Family Medicine

## 2014-04-24 ENCOUNTER — Ambulatory Visit (INDEPENDENT_AMBULATORY_CARE_PROVIDER_SITE_OTHER): Payer: Medicare HMO | Admitting: Family Medicine

## 2014-04-24 VITALS — BP 122/84 | HR 81 | Temp 97.9°F | Ht 65.75 in | Wt 222.5 lb

## 2014-04-24 DIAGNOSIS — J069 Acute upper respiratory infection, unspecified: Secondary | ICD-10-CM

## 2014-04-24 MED ORDER — HYDROCODONE-HOMATROPINE 5-1.5 MG/5ML PO SYRP: 5.0000 mL | ORAL_SOLUTION | Freq: Three times a day (TID) | ORAL | Status: DC | PRN

## 2014-04-24 NOTE — Progress Notes (Signed)
Pre visit review using our clinic review tool, if applicable. No additional management support is needed unless otherwise documented below in the visit note. 

## 2014-04-24 NOTE — Progress Notes (Signed)
HPI:  -started: 4 days -symptoms:nasal congestion, sore throat, cough, low grade temp around 99 initially -denies:fever, SOB, NVD, tooth pain -has tried: tylenol sinus -sick contacts/travel/risks: denies flu exposure, tick exposure or or Ebola risks -Hx of: allergies ROS: See pertinent positives and negatives per HPI.  Past Medical History  Diagnosis Date  . ALLERGIC RHINITIS 05/27/2007    uses Flonase daily as needed and ALbuterol inhaler prn;gets Allergy shots  . ANXIETY 12/19/2009  . ASTHMATIC BRONCHITIS, ACUTE 47yrs ago  . LOW BACK PAIN 05/27/2007  . Essential hypertension, benign 06/26/2009    takes Amlodipine daily  . HYPERTENSION 07/03/2009  . Hyperlipidemia     takes Pravastatin daily  . DEPRESSION 12/19/2009    takes Zoloft daily  . History of migraine many yrs ago  . SEIZURE DISORDER 7 yrs ago was last one    takes Tegretol daily  . Numbness     left toes   . Arthritis   . Joint pain   . Joint swelling   . History of gout   . GERD 05/27/2007    takes Nexium daily  . Constipation   . Diverticulosis   . Hyperthyroidism     takes Synthroid daily  . Insomnia     but doesn't take any meds    Past Surgical History  Procedure Laterality Date  . Heel spurs    . Tonsillectomy    . Adenoidectomy    . Back surgery      x 1  . Colonoscopy    . Esophagogastroduodenoscopy    . Knee arthroplasty Left 11/07/2013    Procedure: COMPUTER ASSISTED TOTAL KNEE ARTHROPLASTY;  Surgeon: Marybelle Killings, MD;  Location: [facility];  Service: Orthopedics;  Laterality: Left;  Left Total Knee Arthroplasty, Cemented, Computer Assist    Family History  Problem Relation Age of Onset  . Arthritis Mother   . Heart murmur Other     History   Social History  . Marital Status: Married    Spouse Name: N/A    Number of Children: N/A  . Years of Education: N/A   Social History Main Topics  . Smoking status: Former Research scientist (life sciences)  . Smokeless tobacco: Never Used     Comment: quit smokikng 99yrs ago   . Alcohol Use: No  . Drug Use: No  . Sexual Activity: No   Other Topics Concern  . None   Social History Narrative    Current outpatient prescriptions: albuterol (PROVENTIL HFA;VENTOLIN HFA) 108 (90 BASE) MCG/ACT inhaler, Inhale 1 puff into the lungs every 6 (six) hours as needed for wheezing or shortness of breath., Disp: , Rfl: ;  aspirin EC 325 MG tablet, Take 1 tablet (325 mg total) by mouth daily., Disp: 30 tablet, Rfl: 0;  carbamazepine (TEGRETOL) 200 MG tablet, Take 400 mg by mouth 2 (two) times daily. , Disp: , Rfl:  Cyanocobalamin (VITAMIN B 12 PO), Take by mouth daily.  , Disp: , Rfl: ;  esomeprazole (NEXIUM) 20 MG capsule, Take 20 mg by mouth daily before breakfast., Disp: , Rfl: ;  fluticasone (FLONASE) 50 MCG/ACT nasal spray, Place 2 sprays into the nose daily., Disp: 16 g, Rfl: 6;  levothyroxine (SYNTHROID, LEVOTHROID) 175 MCG tablet, Take 175 mcg by mouth daily.  , Disp: , Rfl:  lisinopril (PRINIVIL,ZESTRIL) 10 MG tablet, TAKE 1 TABLET (10 MG TOTAL) BY MOUTH DAILY., Disp: 30 tablet, Rfl: 2;  Multiple Vitamin (MULTIVITAMIN) capsule, Take 1 capsule by mouth daily.  , Disp: , Rfl: ;  Omega-3 Fatty Acids (FISH OIL PO), Take by mouth daily.  , Disp: , Rfl: ;  pravastatin (PRAVACHOL) 20 MG tablet, TAKE 1 TABLET BY MOUTH EVERY DAY, Disp: 90 tablet, Rfl: 3 sertraline (ZOLOFT) 100 MG tablet, Take 100 mg by mouth daily., Disp: , Rfl: ;  HYDROcodone-homatropine (HYCODAN) 5-1.5 MG/5ML syrup, Take 5 mLs by mouth every 8 (eight) hours as needed for cough., Disp: 120 mL, Rfl: 0  EXAM:  Filed Vitals:   04/24/14 1251  BP: 122/84  Pulse: 81  Temp: 97.9 F (36.6 C)    Body mass index is 36.19 kg/(m^2).  GENERAL: vitals reviewed and listed above, alert, oriented, appears well hydrated and in no acute distress  HEENT: atraumatic, conjunttiva clear, no obvious abnormalities on inspection of external nose and ears, normal appearance of ear canals and TMs, clear nasal congestion, mild post  oropharyngeal erythema with PND, no tonsillar edema or exudate, no sinus TTP  NECK: no obvious masses on inspection  LUNGS: clear to auscultation bilaterally, no wheezes, rales or rhonchi, good air movement  CV: HRRR, no peripheral edema  MS: moves all extremities without noticeable abnormality  PSYCH: pleasant and cooperative, no obvious depression or anxiety  ASSESSMENT AND PLAN:  Discussed the following assessment and plan:  Acute upper respiratory infection - Plan: HYDROcodone-homatropine (HYCODAN) 5-1.5 MG/5ML syrup  -given HPI and exam findings today, a serious infection or illness is unlikely. We discussed potential etiologies, with VURI being most likely, and advised supportive care and monitoring. We discussed treatment side effects, likely course, antibiotic misuse, transmission, and signs of developing a serious illness. -of course, we advised to return or notify a doctor immediately if symptoms worsen or persist or new concerns arise.    Patient Instructions  INSTRUCTIONS FOR UPPER RESPIRATORY INFECTION:  -plenty of rest and fluids  -nasal saline wash 2-3 times daily (use prepackaged nasal saline or bottled/distilled water if making your own)   -can use afrin nasal spray for drainage and nasal congestion - but do NOT use longer then 3-4 days  -can use tylenol or ibuprofen as directed for aches and sorethroat  -in the winter time, using a humidifier at night is helpful (please follow cleaning instructions)  -if you are taking a cough medication - use only as directed, may also try a teaspoon of honey to coat the throat and throat lozenges  -for sore throat, salt water gargles can help  -follow up if you have fevers, facial pain, tooth pain, difficulty breathing or are worsening or not getting better in 5-7 days      Karmyn Lowman R.

## 2014-04-24 NOTE — Patient Instructions (Signed)
INSTRUCTIONS FOR UPPER RESPIRATORY INFECTION:  -plenty of rest and fluids  -nasal saline wash 2-3 times daily (use prepackaged nasal saline or bottled/distilled water if making your own)   -can use afrin nasal spray for drainage and nasal congestion - but do NOT use longer then 3-4 days  -can use tylenol or ibuprofen as directed for aches and sorethroat  -in the winter time, using a humidifier at night is helpful (please follow cleaning instructions)  -if you are taking a cough medication - use only as directed, may also try a teaspoon of honey to coat the throat and throat lozenges  -for sore throat, salt water gargles can help  -follow up if you have fevers, facial pain, tooth pain, difficulty breathing or are worsening or not getting better in 5-7 days  

## 2014-04-27 ENCOUNTER — Ambulatory Visit: Payer: Medicare HMO | Admitting: Family Medicine

## 2014-05-24 ENCOUNTER — Ambulatory Visit: Payer: Medicare HMO | Admitting: Family Medicine

## 2014-05-30 ENCOUNTER — Encounter: Payer: Self-pay | Admitting: Family Medicine

## 2014-05-30 ENCOUNTER — Ambulatory Visit (INDEPENDENT_AMBULATORY_CARE_PROVIDER_SITE_OTHER): Payer: Medicare HMO | Admitting: Family Medicine

## 2014-05-30 VITALS — BP 140/80 | HR 70 | Temp 97.8°F | Ht 65.75 in | Wt 221.2 lb

## 2014-05-30 DIAGNOSIS — K219 Gastro-esophageal reflux disease without esophagitis: Secondary | ICD-10-CM

## 2014-05-30 DIAGNOSIS — F418 Other specified anxiety disorders: Secondary | ICD-10-CM

## 2014-05-30 DIAGNOSIS — E669 Obesity, unspecified: Secondary | ICD-10-CM

## 2014-05-30 DIAGNOSIS — I10 Essential (primary) hypertension: Secondary | ICD-10-CM

## 2014-05-30 DIAGNOSIS — Z23 Encounter for immunization: Secondary | ICD-10-CM

## 2014-05-30 DIAGNOSIS — G40909 Epilepsy, unspecified, not intractable, without status epilepticus: Secondary | ICD-10-CM

## 2014-05-30 LAB — BASIC METABOLIC PANEL
BUN: 14 mg/dL (ref 6–23)
CO2: 33 mEq/L — ABNORMAL HIGH (ref 19–32)
Calcium: 9.3 mg/dL (ref 8.4–10.5)
Chloride: 100 mEq/L (ref 96–112)
Creatinine, Ser: 0.65 mg/dL (ref 0.40–1.20)
GFR: 96.83 mL/min (ref >60.00)
Glucose, Bld: 91 mg/dL (ref 70–99)
Potassium: 4.6 mEq/L (ref 3.5–5.1)
SODIUM: 135 meq/L (ref 135–145)

## 2014-05-30 NOTE — Progress Notes (Signed)
Pre visit review using our clinic review tool, if applicable. No additional management support is needed unless otherwise documented below in the visit note. 

## 2014-05-30 NOTE — Addendum Note (Signed)
Addended by: Agnes Lawrence on: 05/30/2014 10:33 AM   Modules accepted: Orders

## 2014-05-30 NOTE — Progress Notes (Signed)
HPI:  HTN/Venous insufficiency: - started lisinopril 01/2014 -norvasc caused swelling, hctz caused hyponatremia -reports: doing well -denies: CP, SOB, palpitations  HLD/Prediabetes: -meds: pravastatin 20mg  -denies: leg cramps or cog changes, polyuria, polydipsia -Diet and exercise:  Depression: -meds: zoloft 100mg  -denies: SI, worsening  Allergic Rhinitis: -followed by [facility] -reports has appt this month  GERD: -nexium 20mg  daily  Hypothyroidism: -managed by Dr. Wilson Singer -meds: synthroid 152mcg per day -stable  S/p knee replacement: -has had itching in knee and ortho is testing for allergy to knee replacement, reports has had allergy to jewelery in the past -denies:hives, SOB, systemic reaction  Seizure Disorder:  -followed by neurology  -no seizures in 30 years  -on tegretol long term  ROS: negative for report of fevers, unintentional weight loss, vision changes, vision loss, hearing loss or change, chest pain, sob, hemoptysis, melena, hematochezia, hematuria, genital discharge or lesions, falls, bleeding or bruising, loc, thoughts of suicide or self harm, memory loss   ROS: See pertinent positives and negatives per HPI.  Past Medical History  Diagnosis Date  . ALLERGIC RHINITIS 05/27/2007    uses Flonase daily as needed and ALbuterol inhaler prn;gets Allergy shots  . ANXIETY 12/19/2009  . ASTHMATIC BRONCHITIS, ACUTE 89yrs ago  . LOW BACK PAIN 05/27/2007  . Essential hypertension, benign 06/26/2009    takes Amlodipine daily  . HYPERTENSION 07/03/2009  . Hyperlipidemia     takes Pravastatin daily  . DEPRESSION 12/19/2009    takes Zoloft daily  . History of migraine many yrs ago  . SEIZURE DISORDER 7 yrs ago was last one    takes Tegretol daily  . Numbness     left toes   . Arthritis   . Joint pain   . Joint swelling   . History of gout   . GERD 05/27/2007    takes Nexium daily  . Constipation   . Diverticulosis   . Hyperthyroidism     takes  Synthroid daily  . Insomnia     but doesn't take any meds    Past Surgical History  Procedure Laterality Date  . Heel spurs    . Tonsillectomy    . Adenoidectomy    . Back surgery      x 1  . Colonoscopy    . Esophagogastroduodenoscopy    . Knee arthroplasty Left 11/07/2013    Procedure: COMPUTER ASSISTED TOTAL KNEE ARTHROPLASTY;  Surgeon: Marybelle Killings, MD;  Location: [facility];  Service: Orthopedics;  Laterality: Left;  Left Total Knee Arthroplasty, Cemented, Computer Assist    Family History  Problem Relation Age of Onset  . Arthritis Mother   . Heart murmur Other     History   Social History  . Marital Status: Married    Spouse Name: N/A    Number of Children: N/A  . Years of Education: N/A   Social History Main Topics  . Smoking status: Former Research scientist (life sciences)  . Smokeless tobacco: Never Used     Comment: quit smokikng 67yrs ago  . Alcohol Use: No  . Drug Use: No  . Sexual Activity: No   Other Topics Concern  . None   Social History Narrative     Current outpatient prescriptions:  .  albuterol (PROVENTIL HFA;VENTOLIN HFA) 108 (90 BASE) MCG/ACT inhaler, Inhale 1 puff into the lungs every 6 (six) hours as needed for wheezing or shortness of breath., Disp: , Rfl:  .  aspirin EC 325 MG tablet, Take 1 tablet (325 mg total) by mouth  daily., Disp: 30 tablet, Rfl: 0 .  carbamazepine (TEGRETOL) 200 MG tablet, Take 400 mg by mouth 2 (two) times daily. , Disp: , Rfl:  .  Cyanocobalamin (VITAMIN B 12 PO), Take by mouth daily.  , Disp: , Rfl:  .  esomeprazole (NEXIUM) 20 MG capsule, Take 20 mg by mouth daily before breakfast., Disp: , Rfl:  .  fluticasone (FLONASE) 50 MCG/ACT nasal spray, Place 2 sprays into the nose daily., Disp: 16 g, Rfl: 6 .  levothyroxine (SYNTHROID, LEVOTHROID) 175 MCG tablet, Take 175 mcg by mouth daily.  , Disp: , Rfl:  .  lisinopril (PRINIVIL,ZESTRIL) 10 MG tablet, TAKE 1 TABLET (10 MG TOTAL) BY MOUTH DAILY., Disp: 30 tablet, Rfl: 2 .  Multiple Vitamin  (MULTIVITAMIN) capsule, Take 1 capsule by mouth daily.  , Disp: , Rfl:  .  Omega-3 Fatty Acids (FISH OIL PO), Take by mouth daily.  , Disp: , Rfl:  .  pravastatin (PRAVACHOL) 20 MG tablet, TAKE 1 TABLET BY MOUTH EVERY DAY, Disp: 90 tablet, Rfl: 3 .  sertraline (ZOLOFT) 100 MG tablet, Take 100 mg by mouth daily., Disp: , Rfl:   EXAM:  Filed Vitals:   05/30/14 0956  BP: 140/80  Pulse: 70  Temp: 97.8 F (36.6 C)    Body mass index is 35.98 kg/(m^2).  GENERAL: vitals reviewed and listed above, alert, oriented, appears well hydrated and in no acute distress  HEENT: atraumatic, conjunttiva clear, no obvious abnormalities on inspection of external nose and ears  NECK: no obvious masses on inspection  LUNGS: clear to auscultation bilaterally, no wheezes, rales or rhonchi, good air movement  CV: HRRR, no peripheral edema  MS: moves all extremities without noticeable abnormality  PSYCH: pleasant and cooperative, no obvious depression or anxiety  ASSESSMENT AND PLAN:  Discussed the following assessment and plan:  Essential hypertension - Plan: Basic metabolic panel  Gastroesophageal reflux disease, esophagitis presence not specified  Depression with anxiety  Seizure disorder  Obesity, BMI unknown  -check BMP -monitor BP, she reports always low home and eslewhere -Patient advised to return or notify a doctor immediately if symptoms worsen or persist or new concerns arise.  There are no Patient Instructions on file for this visit.   Colin Benton R.

## 2014-05-30 NOTE — Patient Instructions (Signed)
BEFORE YOU LEAVE: -labs -schedule follow up in 3 months Tdap  We recommend the following healthy lifestyle measures: - eat a healthy diet consisting of lots of vegetables, fruits, beans, nuts, seeds, healthy meats such as white chicken and fish and whole grains.  - avoid fried foods, fast food, processed foods, sodas, red meet and other fattening foods.  - get a least 150 minutes of aerobic exercise per week.

## 2014-07-10 ENCOUNTER — Other Ambulatory Visit: Payer: Self-pay | Admitting: Family Medicine

## 2014-07-21 ENCOUNTER — Telehealth: Payer: Self-pay | Admitting: Family Medicine

## 2014-07-21 MED ORDER — PRAVASTATIN SODIUM 20 MG PO TABS: 20.0000 mg | ORAL_TABLET | Freq: Every day | ORAL | Status: DC

## 2014-07-21 NOTE — Telephone Encounter (Signed)
Pt request refill of the following: pravastatin (PRAVACHOL) 20 MG tablet   Phamacy: [facility]

## 2014-07-21 NOTE — Telephone Encounter (Signed)
Rx done. 

## 2014-07-22 ENCOUNTER — Other Ambulatory Visit: Payer: Self-pay | Admitting: Family Medicine

## 2014-07-24 ENCOUNTER — Telehealth: Payer: Self-pay | Admitting: Family Medicine

## 2014-07-24 DIAGNOSIS — J3089 Other allergic rhinitis: Secondary | ICD-10-CM

## 2014-07-24 MED ORDER — FLUTICASONE PROPIONATE 50 MCG/ACT NA SUSP: 2.0000 | Freq: Every day | NASAL | Status: DC

## 2014-07-24 NOTE — Telephone Encounter (Signed)
Pt request refill of the following: fluticasone (FLONASE) 50 MCG/ACT nasal spray   Phamacy: [facility]

## 2014-07-24 NOTE — Telephone Encounter (Signed)
Rx done. 

## 2014-08-11 ENCOUNTER — Encounter: Payer: Medicare HMO | Admitting: Family Medicine

## 2014-08-11 ENCOUNTER — Encounter: Payer: Self-pay | Admitting: Family Medicine

## 2014-08-11 NOTE — Progress Notes (Signed)
error    This encounter was created in error - please disregard.

## 2014-08-11 NOTE — Progress Notes (Signed)
Pre visit review using our clinic review tool, if applicable. No additional management support is needed unless otherwise documented below in the visit note. 

## 2014-08-14 ENCOUNTER — Ambulatory Visit (INDEPENDENT_AMBULATORY_CARE_PROVIDER_SITE_OTHER): Payer: Medicare HMO | Admitting: Family Medicine

## 2014-08-14 ENCOUNTER — Encounter: Payer: Self-pay | Admitting: Family Medicine

## 2014-08-14 ENCOUNTER — Encounter: Payer: Self-pay | Admitting: *Deleted

## 2014-08-14 VITALS — BP 138/72 | HR 86 | Temp 98.4°F | Ht 65.75 in | Wt 220.3 lb

## 2014-08-14 DIAGNOSIS — E039 Hypothyroidism, unspecified: Secondary | ICD-10-CM | POA: Diagnosis not present

## 2014-08-14 DIAGNOSIS — R300 Dysuria: Secondary | ICD-10-CM | POA: Diagnosis not present

## 2014-08-14 DIAGNOSIS — Z01818 Encounter for other preprocedural examination: Secondary | ICD-10-CM

## 2014-08-14 LAB — POCT URINALYSIS DIPSTICK
BILIRUBIN UA: NEGATIVE
Blood, UA: NEGATIVE
Glucose, UA: NEGATIVE
Nitrite, UA: NEGATIVE
Spec Grav, UA: 1.02
UROBILINOGEN UA: 0.2
pH, UA: 5.5

## 2014-08-14 LAB — TSH: TSH: 0.98 u[IU]/mL (ref 0.35–4.50)

## 2014-08-14 NOTE — Addendum Note (Signed)
Addended by: Lucretia Kern on: 08/14/2014 11:21 AM   Modules accepted: Orders

## 2014-08-14 NOTE — Progress Notes (Signed)
Pre visit review using our clinic review tool, if applicable. No additional management support is needed unless otherwise documented below in the visit note. 

## 2014-08-14 NOTE — Progress Notes (Signed)
No chief complaint on file.   HPI:  Patient is seen for optimization of general medical care prior to surgery. Surgery type: revision of L knee hardware - pt allergic to the nickel  Date of surgery: no date set yet  Kidney disease? No, had BMP recently and was normal Prior surgeries/Issues following anesthesia? Had knee surgery last year without any complications Hx MI, heart arrythmia, CHF, angina or stroke? none Epilepsy or Seizures? Remote seizure hx - last seizure in 1980s, reports she saw her neurologist last week and pt reports told would be fine for her to have surgery Arthritis or problems with neck or jaw? none Thyroid disease? Yes, hx of graves, s/p RAI, see endocrinologist for this, stable for a long time per her report Liver disease? none Asthma, COPD or chronic lung disease? Mild intermittent asthma  Diabetes? none (Needs to be evaluated by anesthesia if yes to these questions.)  Other: Poor nutrition, Frail or other: no  METS:   ?Can do heavy work around the house such as scrubbing floors or lifting or moving heavy furniture (between 4 and 10 METs). yes  . AHA Risks: Major predictors that require intensive management and may lead to delay in or cancellation of the operative procedure unless emergent: NONE  Intermediate risk (reported risk of cardiac death or nonfatal MI generally 1 to 5 percent):  Medications that need to be addressed prior to surgery:  Discontinue acei/arbs/non-statin lipid lowering drugs day of surgery - lisinopril ASA stop 7 days before or discuss with cardiology if CV risks, other anticoagulants discuss with cardiology. ASA  ROS: See pertinent positives and negatives per HPI. 11 point ROS negative except where noted.  Past Medical History  Diagnosis Date  . ALLERGIC RHINITIS 05/27/2007    uses Flonase daily as needed and ALbuterol inhaler prn;gets Allergy shots  . Anxiety and depression 12/19/2009  . LOW BACK PAIN 05/27/2007  . Essential  hypertension, benign 06/26/2009  . Hyperlipidemia     takes Pravastatin daily  . History of migraine many yrs ago  . Seizure disorder     reports remote, takes Tegretol daily, followed by neurology  . Numbness     left toes   . Arthritis   . History of gout   . GERD 05/27/2007    takes Nexium daily  . Constipation   . Diverticulosis   . Insomnia     but doesn't take any meds    Past Surgical History  Procedure Laterality Date  . Heel spurs    . Tonsillectomy    . Adenoidectomy    . Back surgery      x 1  . Colonoscopy    . Esophagogastroduodenoscopy    . Knee arthroplasty Left 11/07/2013    Procedure: COMPUTER ASSISTED TOTAL KNEE ARTHROPLASTY;  Surgeon: Marybelle Killings, MD;  Location: [facility];  Service: Orthopedics;  Laterality: Left;  Left Total Knee Arthroplasty, Cemented, Computer Assist    Family History  Problem Relation Age of Onset  . Arthritis Mother   . Heart murmur Other     History   Social History  . Marital Status: Married    Spouse Name: N/A  . Number of Children: N/A  . Years of Education: N/A   Social History Main Topics  . Smoking status: Former Research scientist (life sciences)  . Smokeless tobacco: Never Used     Comment: quit smokikng 34yrs ago  . Alcohol Use: No  . Drug Use: No  . Sexual Activity: No  Other Topics Concern  . None   Social History Narrative     Current outpatient prescriptions:  .  albuterol (PROVENTIL HFA;VENTOLIN HFA) 108 (90 BASE) MCG/ACT inhaler, Inhale 1 puff into the lungs every 6 (six) hours as needed for wheezing or shortness of breath., Disp: , Rfl:  .  aspirin EC 325 MG tablet, Take 1 tablet (325 mg total) by mouth daily., Disp: 30 tablet, Rfl: 0 .  carbamazepine (TEGRETOL) 200 MG tablet, Take 400 mg by mouth 2 (two) times daily. , Disp: , Rfl:  .  Cyanocobalamin (VITAMIN B 12 PO), Take by mouth daily.  , Disp: , Rfl:  .  esomeprazole (NEXIUM) 20 MG capsule, Take 20 mg by mouth daily before breakfast., Disp: , Rfl:  .  fluticasone (FLONASE)  50 MCG/ACT nasal spray, Place 2 sprays into both nostrils daily., Disp: 16 g, Rfl: 6 .  hydrOXYzine (ATARAX/VISTARIL) 25 MG tablet, as needed. , Disp: , Rfl: 1 .  levothyroxine (SYNTHROID, LEVOTHROID) 175 MCG tablet, Take 175 mcg by mouth daily.  , Disp: , Rfl:  .  lisinopril (PRINIVIL,ZESTRIL) 10 MG tablet, TAKE 1 TABLET BY MOUTH EVERY DAY, Disp: 90 tablet, Rfl: 0 .  Multiple Vitamin (MULTIVITAMIN) capsule, Take 1 capsule by mouth daily.  , Disp: , Rfl:  .  Omega-3 Fatty Acids (FISH OIL PO), Take by mouth daily.  , Disp: , Rfl:  .  pravastatin (PRAVACHOL) 20 MG tablet, Take 1 tablet (20 mg total) by mouth daily., Disp: 90 tablet, Rfl: 0 .  sertraline (ZOLOFT) 100 MG tablet, TAKE 1 TABLET EVERY DAY, Disp: 90 tablet, Rfl: 3  EXAM:  Filed Vitals:   08/14/14 1049  BP: 138/72  Pulse: 86  Temp: 98.4 F (36.9 C)    Body mass index is 35.83 kg/(m^2).  GENERAL: vitals reviewed and listed above, alert, oriented, appears well hydrated and in no acute distress  HEENT: atraumatic, conjunttiva clear, no obvious abnormalities on inspection of external nose and ears  NECK: no obvious masses on inspection, no carotid bruits  LUNGS: clear to auscultation bilaterally, no wheezes, rales or rhonchi, good air movement  CV: HRRR, no peripheral edema, no JVD, BP normal range, normal radial pulses  MS: moves all extremities without noticeable abnormality  PSYCH: pleasant and cooperative, no obvious depression or anxiety  ASSESSMENT AND PLAN:  Discussed the following assessment and plan:  Pre-op examination  Dysuria - Plan: POC Urinalysis Dipstick  Assessment: -Risk factors: no CV risks factors, she does have several past medical problems that would warrant a pre-op visit with anesthesiology prior to her surgery, including thyroid disease, asthma and hx of remote seizure disorder.  -Surgery Risks:intermediate -age, nutritional status, fraility: good nutritional status, age >81, no  fraility -functional capacity: 4-10 METs wethout symptoms Patient Specific Risks: patient is low risk for intermediate risks surgery   Recommendations for optimizing general medical care prior to surgery: -will check a TSH today as she only sees her endocrinologist yearly - if abnormal have advise she see her endocrinologist for management -advised pre-operative evaluation with anesthesiology given her history of treated thyroid disease, hx of remote seizure and hx of mild intermittent asthma -advised patient to discuss specific risks morbidity and mortality of surgery with surgeon, CV risks discussed with patient -advise following surgeons recommendations regarding medication and advise stopping lisinopril the day of surgery and stopping aspirin 1 week prior to surgery -advised patient will defer to surgeon for post-op DVT prophylaxis and post op care -letter for pre-op optimization  of general medical care prior including this assessment and plan provided to patient to provide to surgeon  -Patient advised to return or notify a doctor immediately if symptoms worsen or persist or new concerns arise.   Dysuria:  S: Mild odor to urine, started a few days ago. Mild urinary frequency Denies: fevers, pian with urination, blood in urine, nausea, vomiting, flank or abd pain.  O: See exam above, no CVA TTP  A: Dysuria  P: Udip  Patient Instructions  BEFORE YOU LEAVE: -labs and urine dip  To Whom it may concern:  The following patient, Beverly Ballard, DOB: [birth year], was seen in our office on the following date, 08/14/2014, for optimization of medical care prior to surgery. Please see the attached office visit note and below for the recommendations we advised.  Assessment: -pre-op evaluation for knee surgery -Risk factors: no CV risks factors, she does have several past medical problems that would warrant a pre-op visit with anesthesiology prior to her surgery, including thyroid  disease, asthma and hx of remote seizure disorder.  -Surgery Risks:intermediate -age, nutritional status, fraility: good nutritional status, age >6, no fraility -functional capacity: 4-10 METs wethout symptoms Patient Specific Risks: patient is low risk for intermediate risks surgery   Recommendations for optimizing general medical care prior to surgery: -will check a TSH today as she only sees her endocrinologist yearly - if abnormal have dshe see her endocrinologist for management -advised pre-operative evaluation with anesthesiology given her history of treated thyroid disease, hx of remote seizure and hx of mild intermittent asthma -advised patient to discuss specific risks morbidity and mortality of surgery with surgeon, CV risks discussed with patient -advise following surgeons recommendations regarding medication and advise stopping lisinopril the day of surgery and stopping aspirin 1 week prior to surgery -advised patient will defer to surgeon for post-op DVT prophylaxis and post op care  Sincerely,    Colin Benton, DO      Delories Mauri, Kathryn.

## 2014-08-14 NOTE — Addendum Note (Signed)
Addended by: Elmer Picker on: 08/14/2014 12:01 PM   Modules accepted: Orders

## 2014-08-14 NOTE — Patient Instructions (Signed)
BEFORE YOU LEAVE: -labs and urine dip  To Whom it may concern:  The following patient, Beverly Ballard, DOB: 1945/09/09, was seen in our office on the following date, 08/14/2014, for optimization of medical care prior to surgery. Please see the attached office visit note and below for the recommendations we advised.  Assessment: -pre-op evaluation for knee surgery -Risk factors: no CV risks factors, she does have several past medical problems that would warrant a pre-op visit with anesthesiology prior to her surgery, including thyroid disease, asthma and hx of remote seizure disorder.  -Surgery Risks:intermediate -age, nutritional status, fraility: good nutritional status, age >64, no fraility -functional capacity: 4-10 METs wethout symptoms Patient Specific Risks: patient is low risk for intermediate risks surgery   Recommendations for optimizing general medical care prior to surgery: -will check a TSH today as she only sees her endocrinologist yearly - if abnormal have dshe see her endocrinologist for management -advised pre-operative evaluation with anesthesiology given her history of treated thyroid disease, hx of remote seizure and hx of mild intermittent asthma -advised patient to discuss specific risks morbidity and mortality of surgery with surgeon, CV risks discussed with patient -advise following surgeons recommendations regarding medication and advise stopping lisinopril the day of surgery and stopping aspirin 1 week prior to surgery -advised patient will defer to surgeon for post-op DVT prophylaxis and post op care  Sincerely,    Colin Benton, DO

## 2014-08-17 LAB — URINE CULTURE: Colony Count: 70000

## 2014-08-17 MED ORDER — CIPROFLOXACIN HCL 250 MG PO TABS: 250.0000 mg | ORAL_TABLET | Freq: Two times a day (BID) | ORAL | Status: DC

## 2014-08-17 NOTE — Addendum Note (Signed)
Addended by: Agnes Lawrence on: 08/17/2014 08:55 AM   Modules accepted: Orders

## 2014-09-04 ENCOUNTER — Other Ambulatory Visit (HOSPITAL_COMMUNITY): Payer: Self-pay | Admitting: Orthopaedic Surgery

## 2014-09-08 ENCOUNTER — Encounter (HOSPITAL_COMMUNITY)
Admission: RE | Admit: 2014-09-08 | Discharge: 2014-09-08 | Disposition: A | Discharge status: Home / Self Care | Payer: Medicare HMO | Source: Ambulatory Visit | Attending: Orthopaedic Surgery | Admitting: Orthopaedic Surgery

## 2014-09-08 ENCOUNTER — Encounter (HOSPITAL_COMMUNITY): Payer: Self-pay

## 2014-09-08 HISTORY — DX: Hypothyroidism, unspecified: E03.9

## 2014-09-08 HISTORY — DX: Unspecified convulsions: R56.9

## 2014-09-08 HISTORY — DX: Cardiac murmur, unspecified: R01.1

## 2014-09-08 HISTORY — DX: Malignant (primary) neoplasm, unspecified: C80.1

## 2014-09-08 LAB — CBC WITH DIFFERENTIAL/PLATELET
BASOS ABS: 0 10*3/uL (ref 0.0–0.1)
BASOS PCT: 0 % (ref 0–1)
EOS ABS: 0 10*3/uL (ref 0.0–0.7)
EOS PCT: 0 % (ref 0–5)
HCT: 40.9 % (ref 36.0–46.0)
Hemoglobin: 13.9 g/dL (ref 12.0–15.0)
Lymphocytes Relative: 39 % (ref 12–46)
Lymphs Abs: 2.4 10*3/uL (ref 0.7–4.0)
MCH: 31.2 pg (ref 26.0–34.0)
MCHC: 34 g/dL (ref 30.0–36.0)
MCV: 91.9 fL (ref 78.0–100.0)
Monocytes Absolute: 0.6 10*3/uL (ref 0.1–1.0)
Monocytes Relative: 9 % (ref 3–12)
Neutro Abs: 3.2 10*3/uL (ref 1.7–7.7)
Neutrophils Relative %: 52 % (ref 43–77)
PLATELETS: 218 10*3/uL (ref 150–400)
RBC: 4.45 MIL/uL (ref 3.87–5.11)
RDW: 12.3 % (ref 11.5–15.5)
WBC: 6.2 10*3/uL (ref 4.0–10.5)

## 2014-09-08 LAB — C-REACTIVE PROTEIN: CRP: 0.9 mg/dL (ref <1.0)

## 2014-09-08 LAB — COMPREHENSIVE METABOLIC PANEL
ALT: 16 U/L (ref 14–54)
ANION GAP: 8 (ref 5–15)
AST: 19 U/L (ref 15–41)
Albumin: 3.8 g/dL (ref 3.5–5.0)
Alkaline Phosphatase: 82 U/L (ref 38–126)
BUN: 12 mg/dL (ref 6–20)
CO2: 28 mmol/L (ref 22–32)
Calcium: 9.1 mg/dL (ref 8.9–10.3)
Chloride: 100 mmol/L — ABNORMAL LOW (ref 101–111)
Creatinine, Ser: 0.66 mg/dL (ref 0.44–1.00)
GFR calc non Af Amer: >60 mL/min (ref >60)
GLUCOSE: 85 mg/dL (ref 65–99)
Potassium: 4.6 mmol/L (ref 3.5–5.1)
SODIUM: 136 mmol/L (ref 135–145)
TOTAL PROTEIN: 7.4 g/dL (ref 6.5–8.1)
Total Bilirubin: 0.6 mg/dL (ref 0.3–1.2)

## 2014-09-08 LAB — PROTIME-INR
INR: 1.13 (ref 0.00–1.49)
Prothrombin Time: 14.7 seconds (ref 11.6–15.2)

## 2014-09-08 LAB — URINALYSIS, ROUTINE W REFLEX MICROSCOPIC
BILIRUBIN URINE: NEGATIVE
GLUCOSE, UA: NEGATIVE mg/dL
Hgb urine dipstick: NEGATIVE
Ketones, ur: NEGATIVE mg/dL
Nitrite: NEGATIVE
PH: 6.5 (ref 5.0–8.0)
Protein, ur: NEGATIVE mg/dL
SPECIFIC GRAVITY, URINE: 1.018 (ref 1.005–1.030)
Urobilinogen, UA: 0.2 mg/dL (ref 0.0–1.0)

## 2014-09-08 LAB — SURGICAL PCR SCREEN
MRSA, PCR: NEGATIVE
Staphylococcus aureus: NEGATIVE

## 2014-09-08 LAB — APTT: APTT: 28 s (ref 24–37)

## 2014-09-08 LAB — URINE MICROSCOPIC-ADD ON

## 2014-09-08 LAB — TYPE AND SCREEN
ABO/RH(D): B NEG
ANTIBODY SCREEN: NEGATIVE

## 2014-09-08 LAB — SEDIMENTATION RATE: Sed Rate: 26 mm/hr — ABNORMAL HIGH (ref 0–22)

## 2014-09-08 NOTE — Pre-Procedure Instructions (Addendum)
Beverly Ballard  09/08/2014      [facility] - [place], [place] - [address] [place] [address] Phone: [phone] Fax: [phone]    Your procedure is scheduled on 09/19/14  Report to [facility] short stay admitting at 1100 A.M.  Call this number if you have problems the morning of surgery:  [phone]   Remember:  Do not eat food or drink liquids after midnight.  Take these medicines the morning of surgery with A SIP OF WATER inhalers,tegretol,nexium, levothyroxine,sertraline(zoloft),      STOP all herbel meds, nsaids (aleve,naproxen,advil,ibuprofen) 5 days prior to surgery starting 09/14/14 including aspirin, all vitamins,fish oil   Do not wear jewelry, make-up or nail polish.  Do not wear lotions, powders, or perfumes.  You may wear deodorant.  Do not shave 48 hours prior to surgery.  Men may shave face and neck.  Do not bring valuables to the hospital.  [facility] is not responsible for any belongings or valuables.  Contacts, dentures or bridgework may not be worn into surgery.  Leave your suitcase in the car.  After surgery it may be brought to your room.  For patients admitted to the hospital, discharge time will be determined by your treatment team.  Patients discharged the day of surgery will not be allowed to drive home.   Name and phone number of your driver:    Special instructions:   Special Instructions:  - Preparing for Surgery  Before surgery, you can play an important role.  Because skin is not sterile, your skin needs to be as free of germs as possible.  You can reduce the number of germs on you skin by washing with CHG (chlorahexidine gluconate) soap before surgery.  CHG is an antiseptic cleaner which kills germs and bonds with the skin to continue killing germs even after washing.  Please DO NOT use if you have an allergy to CHG or antibacterial soaps.  If your  skin becomes reddened/irritated stop using the CHG and inform your nurse when you arrive at Short Stay.  Do not shave (including legs and underarms) for at least 48 hours prior to the first CHG shower.  You may shave your face.  Please follow these instructions carefully:   1.  Shower with CHG Soap the night before surgery and the morning of Surgery.  2.  If you choose to wash your hair, wash your hair first as usual with your normal shampoo.  3.  After you shampoo, rinse your hair and body thoroughly to remove the Shampoo.  4.  Use CHG as you would any other liquid soap.  You can apply chg directly  to the skin and wash gently with scrungie or a clean washcloth.  5.  Apply the CHG Soap to your body ONLY FROM THE NECK DOWN.  Do not use on open wounds or open sores.  Avoid contact with your eyes ears, mouth and genitals (private parts).  Wash genitals (private parts)       with your normal soap.  6.  Wash thoroughly, paying special attention to the area where your surgery will be performed.  7.  Thoroughly rinse your body with warm water from the neck down.  8.  DO NOT shower/wash with your normal soap after using and rinsing off the CHG Soap.  9.  Pat yourself dry with a clean towel.  10.  Wear clean pajamas.            11.  Place clean sheets on your bed the night of your first shower and do not sleep with pets.  Day of Surgery  Do not apply any lotions/deodorants the morning of surgery.  Please wear clean clothes to the hospital/surgery center.  Please read over the following fact sheets that you were given. Pain Booklet, Coughing and Deep Breathing, Blood Transfusion Information, Total Joint Packet, MRSA Information and Surgical Site Infection Prevention

## 2014-09-11 NOTE — Progress Notes (Signed)
Anesthesia Chart Review:  Patient is a 69 year old female scheduled for left TKA revision on 09/19/14 by Dr. Erlinda Hong.  History includes former smoker, HTN, HLD, migraines, anxiety, depression, seizure disorder, GERD, insomnia, hypothyroidism, murmur ("years ago" otherwise not specified), vaginal cancer, back surgery X 2, left TKA 11/07/13. PCP is Dr. Colin Benton. Patient was seen on 08/14/14 for medical optimization prior to surgery. She felt patient was low risk for intermediate risk surgery. Recommend post-operative DVT prophylaxis.  Meds include albuterol, ASA, Tegretol, Nexium, Flonase, levothyroxine, fish oil, pravastatin, sertraline.   09/08/14 EKG: SB with first degree AVB. PR interval has slightly increased otherwise no significant change since 11/01/13.  11/01/13 CXR: IMPRESSION: No acute cardiopulmonary disease. Cardiomegaly without congestive failure. Peribronchial thickening which may relate to chronic bronchitis or smoking.  Preoperative labs noted. TSH on 08/14/14 WNL.  If no acute changes then I anticipate that she can proceed as planned.  George Hugh [facility] Short Stay Center/Anesthesiology Phone [phone] 09/11/2014 2:40 PM

## 2014-09-18 MED ORDER — CEFAZOLIN SODIUM-DEXTROSE 2-3 GM-% IV SOLR
2.0000 g | INTRAVENOUS | Status: AC
  Administered 2014-09-19: 2 g via INTRAVENOUS
  Filled 2014-09-18: qty 50

## 2014-09-18 MED ORDER — BUPIVACAINE LIPOSOME 1.3 % IJ SUSP
20.0000 mL | Freq: Once | INTRAMUSCULAR | Status: DC
  Filled 2014-09-18: qty 20

## 2014-09-18 MED ORDER — CHLORHEXIDINE GLUCONATE 4 % EX LIQD: 60.0000 mL | Freq: Once | CUTANEOUS | Status: DC

## 2014-09-19 ENCOUNTER — Inpatient Hospital Stay (HOSPITAL_COMMUNITY): Payer: Medicare HMO

## 2014-09-19 ENCOUNTER — Inpatient Hospital Stay (HOSPITAL_COMMUNITY): Payer: Medicare HMO | Admitting: Vascular Surgery

## 2014-09-19 ENCOUNTER — Inpatient Hospital Stay (HOSPITAL_COMMUNITY): Payer: Medicare HMO | Admitting: Certified Registered Nurse Anesthetist

## 2014-09-19 ENCOUNTER — Inpatient Hospital Stay (HOSPITAL_COMMUNITY)
Admission: RE | Admit: 2014-09-19 | Discharge: 2014-09-21 | DRG: 467 | Disposition: A | Discharge status: Home Health | Payer: Medicare HMO | Source: Ambulatory Visit | Attending: Orthopaedic Surgery | Admitting: Orthopaedic Surgery

## 2014-09-19 ENCOUNTER — Encounter (HOSPITAL_COMMUNITY)
Admission: RE | Disposition: A | Discharge status: Home Health | Payer: Self-pay | Source: Ambulatory Visit | Attending: Orthopaedic Surgery

## 2014-09-19 DIAGNOSIS — Z7982 Long term (current) use of aspirin: Secondary | ICD-10-CM | POA: Diagnosis not present

## 2014-09-19 DIAGNOSIS — D62 Acute posthemorrhagic anemia: Secondary | ICD-10-CM | POA: Diagnosis not present

## 2014-09-19 DIAGNOSIS — G40909 Epilepsy, unspecified, not intractable, without status epilepticus: Secondary | ICD-10-CM | POA: Diagnosis present

## 2014-09-19 DIAGNOSIS — Z96652 Presence of left artificial knee joint: Secondary | ICD-10-CM

## 2014-09-19 DIAGNOSIS — I10 Essential (primary) hypertension: Secondary | ICD-10-CM | POA: Diagnosis present

## 2014-09-19 DIAGNOSIS — F329 Major depressive disorder, single episode, unspecified: Secondary | ICD-10-CM | POA: Diagnosis present

## 2014-09-19 DIAGNOSIS — J309 Allergic rhinitis, unspecified: Secondary | ICD-10-CM | POA: Diagnosis present

## 2014-09-19 DIAGNOSIS — Z87891 Personal history of nicotine dependence: Secondary | ICD-10-CM

## 2014-09-19 DIAGNOSIS — K219 Gastro-esophageal reflux disease without esophagitis: Secondary | ICD-10-CM | POA: Diagnosis present

## 2014-09-19 DIAGNOSIS — Z8261 Family history of arthritis: Secondary | ICD-10-CM | POA: Diagnosis not present

## 2014-09-19 DIAGNOSIS — G43909 Migraine, unspecified, not intractable, without status migrainosus: Secondary | ICD-10-CM | POA: Diagnosis present

## 2014-09-19 DIAGNOSIS — Z79899 Other long term (current) drug therapy: Secondary | ICD-10-CM

## 2014-09-19 DIAGNOSIS — G4700 Insomnia, unspecified: Secondary | ICD-10-CM | POA: Diagnosis present

## 2014-09-19 DIAGNOSIS — R21 Rash and other nonspecific skin eruption: Secondary | ICD-10-CM | POA: Diagnosis present

## 2014-09-19 DIAGNOSIS — E785 Hyperlipidemia, unspecified: Secondary | ICD-10-CM | POA: Diagnosis present

## 2014-09-19 DIAGNOSIS — T8484XA Pain due to internal orthopedic prosthetic devices, implants and grafts, initial encounter: Principal | ICD-10-CM | POA: Diagnosis present

## 2014-09-19 DIAGNOSIS — F419 Anxiety disorder, unspecified: Secondary | ICD-10-CM | POA: Diagnosis present

## 2014-09-19 DIAGNOSIS — Z96659 Presence of unspecified artificial knee joint: Secondary | ICD-10-CM

## 2014-09-19 DIAGNOSIS — E039 Hypothyroidism, unspecified: Secondary | ICD-10-CM | POA: Diagnosis present

## 2014-09-19 HISTORY — PX: TOTAL KNEE REVISION: SHX996

## 2014-09-19 SURGERY — TOTAL KNEE REVISION
Anesthesia: Monitor Anesthesia Care | Site: Knee | Laterality: Left

## 2014-09-19 MED ORDER — FENTANYL CITRATE (PF) 100 MCG/2ML IJ SOLN
INTRAMUSCULAR | Status: DC | PRN
  Administered 2014-09-19 (×4): 50 ug via INTRAVENOUS

## 2014-09-19 MED ORDER — ONDANSETRON HCL 4 MG/2ML IJ SOLN: 4.0000 mg | Freq: Four times a day (QID) | INTRAMUSCULAR | Status: DC | PRN

## 2014-09-19 MED ORDER — PROPOFOL INFUSION 10 MG/ML OPTIME
INTRAVENOUS | Status: DC | PRN
  Administered 2014-09-19: 50 ug/kg/min via INTRAVENOUS
  Administered 2014-09-19: 14:00:00 via INTRAVENOUS

## 2014-09-19 MED ORDER — HYDROCODONE-ACETAMINOPHEN 10-325 MG PO TABS
1.0000 | ORAL_TABLET | ORAL | Status: DC | PRN
  Administered 2014-09-19: 2 via ORAL
  Administered 2014-09-20: 1 via ORAL
  Administered 2014-09-20 – 2014-09-21 (×2): 2 via ORAL
  Filled 2014-09-19 (×5): qty 2

## 2014-09-19 MED ORDER — LEVOTHYROXINE SODIUM 25 MCG PO TABS
175.0000 ug | ORAL_TABLET | Freq: Every day | ORAL | Status: DC
  Administered 2014-09-20 – 2014-09-21 (×2): 175 ug via ORAL
  Filled 2014-09-19 (×4): qty 1

## 2014-09-19 MED ORDER — MENTHOL 3 MG MT LOZG: 1.0000 | LOZENGE | OROMUCOSAL | Status: DC | PRN

## 2014-09-19 MED ORDER — SODIUM CHLORIDE 0.9 % IV SOLN
INTRAVENOUS | Status: DC
  Administered 2014-09-19: 20:00:00 via INTRAVENOUS

## 2014-09-19 MED ORDER — MAGNESIUM CITRATE PO SOLN: 1.0000 | Freq: Once | ORAL | Status: AC | PRN

## 2014-09-19 MED ORDER — DEXTROSE 5 % IV SOLN
500.0000 mg | Freq: Four times a day (QID) | INTRAVENOUS | Status: DC | PRN
  Filled 2014-09-19: qty 5

## 2014-09-19 MED ORDER — PRAVASTATIN SODIUM 20 MG PO TABS
20.0000 mg | ORAL_TABLET | Freq: Every day | ORAL | Status: DC
Start: 2014-09-20 — End: 2014-09-21
  Administered 2014-09-20 – 2014-09-21 (×2): 20 mg via ORAL
  Filled 2014-09-19 (×2): qty 1

## 2014-09-19 MED ORDER — CARBAMAZEPINE 200 MG PO TABS
400.0000 mg | ORAL_TABLET | Freq: Two times a day (BID) | ORAL | Status: DC
  Administered 2014-09-19 – 2014-09-21 (×4): 400 mg via ORAL
  Filled 2014-09-19 (×4): qty 2

## 2014-09-19 MED ORDER — DIPHENHYDRAMINE HCL 12.5 MG/5ML PO ELIX: 25.0000 mg | ORAL_SOLUTION | ORAL | Status: DC | PRN

## 2014-09-19 MED ORDER — ACETAMINOPHEN 500 MG PO TABS
1000.0000 mg | ORAL_TABLET | Freq: Four times a day (QID) | ORAL | Status: AC
  Administered 2014-09-19 – 2014-09-20 (×4): 1000 mg via ORAL
  Filled 2014-09-19 (×5): qty 2

## 2014-09-19 MED ORDER — MORPHINE SULFATE 2 MG/ML IJ SOLN
1.0000 mg | INTRAMUSCULAR | Status: DC | PRN
  Administered 2014-09-19 – 2014-09-20 (×2): 1 mg via INTRAVENOUS
  Filled 2014-09-19 (×2): qty 1

## 2014-09-19 MED ORDER — MIDAZOLAM HCL 2 MG/2ML IJ SOLN
INTRAMUSCULAR | Status: AC
  Filled 2014-09-19: qty 2

## 2014-09-19 MED ORDER — HYDROMORPHONE HCL 1 MG/ML IJ SOLN
0.2500 mg | INTRAMUSCULAR | Status: DC | PRN
  Administered 2014-09-19 (×2): 0.5 mg via INTRAVENOUS

## 2014-09-19 MED ORDER — MIDAZOLAM HCL 2 MG/2ML IJ SOLN
1.0000 mg | INTRAMUSCULAR | Status: DC | PRN
  Administered 2014-09-19 (×3): 2 mg via INTRAVENOUS

## 2014-09-19 MED ORDER — BUPIVACAINE IN DEXTROSE 0.75-8.25 % IT SOLN
INTRATHECAL | Status: DC | PRN
  Administered 2014-09-19: 2 mL via INTRATHECAL

## 2014-09-19 MED ORDER — PROPOFOL 10 MG/ML IV BOLUS
INTRAVENOUS | Status: AC
  Filled 2014-09-19: qty 20

## 2014-09-19 MED ORDER — MIDAZOLAM HCL 5 MG/5ML IJ SOLN
INTRAMUSCULAR | Status: DC | PRN
  Administered 2014-09-19: 2 mg via INTRAVENOUS

## 2014-09-19 MED ORDER — PROMETHAZINE HCL 25 MG/ML IJ SOLN: 6.2500 mg | INTRAMUSCULAR | Status: DC | PRN

## 2014-09-19 MED ORDER — ACETAMINOPHEN 650 MG RE SUPP: 650.0000 mg | Freq: Four times a day (QID) | RECTAL | Status: DC | PRN

## 2014-09-19 MED ORDER — SORBITOL 70 % SOLN: 30.0000 mL | Freq: Every day | Status: DC | PRN

## 2014-09-19 MED ORDER — KETOROLAC TROMETHAMINE 30 MG/ML IJ SOLN
INTRAMUSCULAR | Status: AC
  Filled 2014-09-19: qty 1

## 2014-09-19 MED ORDER — SODIUM CHLORIDE 0.9 % IR SOLN
Status: DC | PRN
  Administered 2014-09-19: 3000 mL

## 2014-09-19 MED ORDER — PROPOFOL 10 MG/ML IV BOLUS
INTRAVENOUS | Status: DC | PRN
  Administered 2014-09-19: 160 mg via INTRAVENOUS
  Administered 2014-09-19: 10 mg via INTRAVENOUS

## 2014-09-19 MED ORDER — FENTANYL CITRATE (PF) 100 MCG/2ML IJ SOLN
INTRAMUSCULAR | Status: AC
  Filled 2014-09-19: qty 2

## 2014-09-19 MED ORDER — FENTANYL CITRATE (PF) 100 MCG/2ML IJ SOLN
50.0000 ug | INTRAMUSCULAR | Status: DC | PRN
  Administered 2014-09-19 (×2): 50 ug via INTRAVENOUS

## 2014-09-19 MED ORDER — ASPIRIN EC 325 MG PO TBEC
325.0000 mg | DELAYED_RELEASE_TABLET | Freq: Two times a day (BID) | ORAL | Status: DC
  Administered 2014-09-19 – 2014-09-21 (×4): 325 mg via ORAL
  Filled 2014-09-19 (×4): qty 1

## 2014-09-19 MED ORDER — CEFAZOLIN SODIUM-DEXTROSE 2-3 GM-% IV SOLR
2.0000 g | Freq: Four times a day (QID) | INTRAVENOUS | Status: AC
  Administered 2014-09-19 – 2014-09-20 (×2): 2 g via INTRAVENOUS
  Filled 2014-09-19 (×2): qty 50

## 2014-09-19 MED ORDER — MIDAZOLAM HCL 2 MG/2ML IJ SOLN
INTRAMUSCULAR | Status: AC
Start: 2014-09-19 — End: 2014-09-20
  Filled 2014-09-19: qty 4

## 2014-09-19 MED ORDER — ALUM & MAG HYDROXIDE-SIMETH 200-200-20 MG/5ML PO SUSP: 30.0000 mL | ORAL | Status: DC | PRN

## 2014-09-19 MED ORDER — FLUTICASONE PROPIONATE 50 MCG/ACT NA SUSP
2.0000 | Freq: Every day | NASAL | Status: DC
  Filled 2014-09-19: qty 16

## 2014-09-19 MED ORDER — OXYCODONE HCL 5 MG PO TABS: 5.0000 mg | ORAL_TABLET | Freq: Once | ORAL | Status: DC | PRN

## 2014-09-19 MED ORDER — LISINOPRIL 10 MG PO TABS
10.0000 mg | ORAL_TABLET | Freq: Every day | ORAL | Status: DC
  Administered 2014-09-20 – 2014-09-21 (×2): 10 mg via ORAL
  Filled 2014-09-19 (×2): qty 1

## 2014-09-19 MED ORDER — LACTATED RINGERS IV SOLN
INTRAVENOUS | Status: DC | PRN
  Administered 2014-09-19: 12:00:00 via INTRAVENOUS

## 2014-09-19 MED ORDER — OCUVITE PO TABS
1.0000 | ORAL_TABLET | Freq: Every day | ORAL | Status: DC
  Administered 2014-09-19 – 2014-09-20 (×2): 1 via ORAL
  Filled 2014-09-19 (×4): qty 1

## 2014-09-19 MED ORDER — ONDANSETRON HCL 4 MG/2ML IJ SOLN
INTRAMUSCULAR | Status: DC | PRN
  Administered 2014-09-19: 4 mg via INTRAVENOUS

## 2014-09-19 MED ORDER — ACETAMINOPHEN 325 MG PO TABS: 650.0000 mg | ORAL_TABLET | Freq: Four times a day (QID) | ORAL | Status: DC | PRN

## 2014-09-19 MED ORDER — ONDANSETRON HCL 4 MG PO TABS: 4.0000 mg | ORAL_TABLET | Freq: Four times a day (QID) | ORAL | Status: DC | PRN

## 2014-09-19 MED ORDER — OXYCODONE HCL 5 MG/5ML PO SOLN: 5.0000 mg | Freq: Once | ORAL | Status: DC | PRN

## 2014-09-19 MED ORDER — DEXAMETHASONE SODIUM PHOSPHATE 4 MG/ML IJ SOLN
INTRAMUSCULAR | Status: DC | PRN
  Administered 2014-09-19: 4 mg via INTRAVENOUS

## 2014-09-19 MED ORDER — FENTANYL CITRATE (PF) 250 MCG/5ML IJ SOLN
INTRAMUSCULAR | Status: AC
  Filled 2014-09-19: qty 5

## 2014-09-19 MED ORDER — METHOCARBAMOL 500 MG PO TABS
500.0000 mg | ORAL_TABLET | Freq: Four times a day (QID) | ORAL | Status: DC | PRN
  Administered 2014-09-19 – 2014-09-21 (×5): 500 mg via ORAL
  Filled 2014-09-19 (×6): qty 1

## 2014-09-19 MED ORDER — POLYETHYLENE GLYCOL 3350 17 G PO PACK: 17.0000 g | PACK | Freq: Every day | ORAL | Status: DC | PRN

## 2014-09-19 MED ORDER — TRANEXAMIC ACID 1000 MG/10ML IV SOLN
1000.0000 mg | Freq: Once | INTRAVENOUS | Status: AC
  Administered 2014-09-19: 1000 mg via INTRAVENOUS
  Filled 2014-09-19: qty 10

## 2014-09-19 MED ORDER — PANTOPRAZOLE SODIUM 40 MG PO TBEC
40.0000 mg | DELAYED_RELEASE_TABLET | Freq: Every day | ORAL | Status: DC
  Administered 2014-09-20 – 2014-09-21 (×2): 40 mg via ORAL
  Filled 2014-09-19 (×2): qty 1

## 2014-09-19 MED ORDER — CELECOXIB 200 MG PO CAPS
200.0000 mg | ORAL_CAPSULE | Freq: Two times a day (BID) | ORAL | Status: DC
  Administered 2014-09-19 – 2014-09-21 (×4): 200 mg via ORAL
  Filled 2014-09-19 (×4): qty 1

## 2014-09-19 MED ORDER — ALBUTEROL SULFATE (2.5 MG/3ML) 0.083% IN NEBU: 3.0000 mL | INHALATION_SOLUTION | Freq: Four times a day (QID) | RESPIRATORY_TRACT | Status: DC | PRN

## 2014-09-19 MED ORDER — KETOROLAC TROMETHAMINE 30 MG/ML IJ SOLN
30.0000 mg | Freq: Four times a day (QID) | INTRAMUSCULAR | Status: DC | PRN
  Administered 2014-09-19 – 2014-09-20 (×4): 30 mg via INTRAVENOUS
  Filled 2014-09-19 (×5): qty 1

## 2014-09-19 MED ORDER — SERTRALINE HCL 100 MG PO TABS
100.0000 mg | ORAL_TABLET | Freq: Every day | ORAL | Status: DC
  Administered 2014-09-20 – 2014-09-21 (×2): 100 mg via ORAL
  Filled 2014-09-19 (×2): qty 1

## 2014-09-19 MED ORDER — HYDROMORPHONE HCL 1 MG/ML IJ SOLN
INTRAMUSCULAR | Status: AC
  Filled 2014-09-19: qty 1

## 2014-09-19 MED ORDER — PHENOL 1.4 % MT LIQD: 1.0000 | OROMUCOSAL | Status: DC | PRN

## 2014-09-19 MED ORDER — LACTATED RINGERS IV SOLN
INTRAVENOUS | Status: DC
  Administered 2014-09-19: 11:00:00 via INTRAVENOUS

## 2014-09-19 MED ORDER — METOCLOPRAMIDE HCL 5 MG/ML IJ SOLN: 5.0000 mg | Freq: Three times a day (TID) | INTRAMUSCULAR | Status: DC | PRN

## 2014-09-19 MED ORDER — METOCLOPRAMIDE HCL 5 MG PO TABS: 5.0000 mg | ORAL_TABLET | Freq: Three times a day (TID) | ORAL | Status: DC | PRN

## 2014-09-19 SURGICAL SUPPLY — 76 items
2mm Legion offset coupler ×2 IMPLANT
4mm Legion offset coupler ×2 IMPLANT
BANDAGE ELASTIC 6 VELCRO ST LF (GAUZE/BANDAGES/DRESSINGS) ×2 IMPLANT
BANDAGE ESMARK 6X9 LF (GAUZE/BANDAGES/DRESSINGS) ×1 IMPLANT
BASEPLATE TIBIAL SZ4 LEFT KNEE (Plate) ×2 IMPLANT
BLADE CHISEL SST 12X40S (BLADE) ×2 IMPLANT
BLADE SAG 18X100X1.27 (BLADE) ×2 IMPLANT
BLADE SAGITTAL 25.0X1.27X90 (BLADE) ×2 IMPLANT
BLADE SURG ROTATE 9660 (MISCELLANEOUS) IMPLANT
BNDG ESMARK 6X9 LF (GAUZE/BANDAGES/DRESSINGS) ×2
BONE CEMENT PALACOS R-G (Orthopedic Implant) ×4 IMPLANT
BOWL SMART MIX CTS (DISPOSABLE) IMPLANT
CEMENT BONE PALACOS R-G (Orthopedic Implant) ×2 IMPLANT
CLSR STERI-STRIP ANTIMIC 1/2X4 (GAUZE/BANDAGES/DRESSINGS) ×2 IMPLANT
COMP FEMORAL CONST SZ5 LT KNEE (Knees) ×2 IMPLANT
COMPONENT FEMRL CONST SZ5LT KN (Knees) ×1 IMPLANT
COUPLER OFFSET 2MM (INSTRUMENTS) ×2 IMPLANT
COUPLER OFFSET 4MM (INSTRUMENTS) ×2 IMPLANT
COVER BACK TABLE 24X17X13 BIG (DRAPES) IMPLANT
COVER SURGICAL LIGHT HANDLE (MISCELLANEOUS) ×2 IMPLANT
CUFF TOURNIQUET SINGLE 34IN LL (TOURNIQUET CUFF) IMPLANT
CUFF TOURNIQUET SINGLE 44IN (TOURNIQUET CUFF) IMPLANT
DERMABOND ADVANCED (GAUZE/BANDAGES/DRESSINGS) ×1
DERMABOND ADVANCED .7 DNX12 (GAUZE/BANDAGES/DRESSINGS) ×1 IMPLANT
DRAPE IMP U-DRAPE 54X76 (DRAPES) ×2 IMPLANT
DRAPE ORTHO SPLIT 77X108 STRL (DRAPES) ×2
DRAPE SURG ORHT 6 SPLT 77X108 (DRAPES) ×2 IMPLANT
DRAPE U-SHAPE 47X51 STRL (DRAPES) ×2 IMPLANT
DRSG PAD ABDOMINAL 8X10 ST (GAUZE/BANDAGES/DRESSINGS) ×2 IMPLANT
DURAPREP 26ML APPLICATOR (WOUND CARE) ×4 IMPLANT
ELECT CAUTERY BLADE 6.4 (BLADE) ×2 IMPLANT
ELECT REM PT RETURN 9FT ADLT (ELECTROSURGICAL) ×2
ELECTRODE REM PT RTRN 9FT ADLT (ELECTROSURGICAL) ×1 IMPLANT
EVACUATOR 1/8 PVC DRAIN (DRAIN) IMPLANT
FACESHIELD STD STERILE (MASK) ×4 IMPLANT
GAUZE SPONGE 4X4 12PLY STRL (GAUZE/BANDAGES/DRESSINGS) ×2 IMPLANT
GAUZE XEROFORM 1X8 LF (GAUZE/BANDAGES/DRESSINGS) ×2 IMPLANT
GLOVE NEODERM STRL 7.5 LF PF (GLOVE) ×2 IMPLANT
GLOVE SURG NEODERM 7.5  LF PF (GLOVE) ×2
GOWN STRL REIN XL XLG (GOWN DISPOSABLE) ×8 IMPLANT
HANDPIECE INTERPULSE COAX TIP (DISPOSABLE)
INSERT SIZE 3-4 15MM KNEE (Insert) ×2 IMPLANT
KIT BASIN OR (CUSTOM PROCEDURE TRAY) ×2 IMPLANT
KIT ROOM TURNOVER OR (KITS) ×2 IMPLANT
MANIFOLD NEPTUNE II (INSTRUMENTS) ×2 IMPLANT
NS IRRIG 1000ML POUR BTL (IV SOLUTION) ×2 IMPLANT
PACK TOTAL JOINT (CUSTOM PROCEDURE TRAY) ×2 IMPLANT
PACK UNIVERSAL I (CUSTOM PROCEDURE TRAY) ×2 IMPLANT
PAD ARMBOARD 7.5X6 YLW CONV (MISCELLANEOUS) ×4 IMPLANT
PAD CAST 4YDX4 CTTN HI CHSV (CAST SUPPLIES) ×1 IMPLANT
PADDING CAST COTTON 4X4 STRL (CAST SUPPLIES) ×1
PADDING CAST COTTON 6X4 STRL (CAST SUPPLIES) ×2 IMPLANT
RASP HELIOCORDIAL MED (MISCELLANEOUS) IMPLANT
SET HNDPC FAN SPRY TIP SCT (DISPOSABLE) IMPLANT
SET PAD KNEE POSITIONER (MISCELLANEOUS) ×2 IMPLANT
STAPLER VISISTAT 35W (STAPLE) ×2 IMPLANT
STEM PRESSFIT STR 15X20 KNEE (Stem) ×4 IMPLANT
SUCTION FRAZIER TIP 10 FR DISP (SUCTIONS) ×2 IMPLANT
SUT MNCRL AB 4-0 PS2 18 (SUTURE) ×4 IMPLANT
SUT PDS AB 1 CT  36 (SUTURE) ×2
SUT PDS AB 1 CT 36 (SUTURE) ×2 IMPLANT
SUT VIC AB 0 CT1 27 (SUTURE) ×2
SUT VIC AB 0 CT1 27XBRD ANBCTR (SUTURE) ×2 IMPLANT
SUT VIC AB 1 CT1 27 (SUTURE) ×3
SUT VIC AB 1 CT1 27XBRD ANBCTR (SUTURE) ×3 IMPLANT
SUT VIC AB 2-0 CT1 27 (SUTURE) ×3
SUT VIC AB 2-0 CT1 TAPERPNT 27 (SUTURE) ×3 IMPLANT
SUT VLOC 180 0 24IN GS25 (SUTURE) ×2 IMPLANT
SWAB COLLECTION DEVICE MRSA (MISCELLANEOUS) IMPLANT
SYR 20CC LL (SYRINGE) ×2 IMPLANT
SYR 30ML LL (SYRINGE) ×2 IMPLANT
TOWEL OR 17X24 6PK STRL BLUE (TOWEL DISPOSABLE) ×2 IMPLANT
TOWEL OR 17X26 10 PK STRL BLUE (TOWEL DISPOSABLE) ×2 IMPLANT
TRAY FOLEY CATH 16FRSI W/METER (SET/KITS/TRAYS/PACK) ×2 IMPLANT
TUBE ANAEROBIC SPECIMEN COL (MISCELLANEOUS) ×2 IMPLANT
WATER STERILE IRR 1000ML POUR (IV SOLUTION) ×2 IMPLANT

## 2014-09-19 NOTE — H&P (Signed)
PREOPERATIVE H&P  Chief Complaint: left total knee arthroplasty pain  HPI: Beverly Ballard is a 69 y.o. female who presents for surgical treatment of left total knee arthroplasty pain.  She denies any changes in medical history.  Past Medical History  Diagnosis Date  . ALLERGIC RHINITIS 05/27/2007    uses Flonase daily as needed and ALbuterol inhaler prn;gets Allergy shots  . Anxiety and depression 12/19/2009  . LOW BACK PAIN 05/27/2007  . Essential hypertension, benign 06/26/2009  . Hyperlipidemia     takes Pravastatin daily  . History of migraine many yrs ago  . Seizure disorder     reports remote, takes Tegretol daily, followed by neurology  . Numbness     left toes   . Arthritis   . History of gout   . GERD 05/27/2007    takes Nexium daily  . Constipation   . Diverticulosis   . Insomnia     but doesn't take any meds  . Heart murmur     yrs ago  . Environmental allergies   . Hypothyroidism   . Depression   . Seizures     last 85  . Cancer     vaginal   Past Surgical History  Procedure Laterality Date  . Heel spurs Bilateral   . Tonsillectomy    . Adenoidectomy    . Colonoscopy    . Esophagogastroduodenoscopy    . Knee arthroplasty Left 11/07/2013    Procedure: COMPUTER ASSISTED TOTAL KNEE ARTHROPLASTY;  Surgeon: Marybelle Killings, MD;  Location: [facility];  Service: Orthopedics;  Laterality: Left;  Left Total Knee Arthroplasty, Cemented, Computer Assist  . Back surgery      x2   History   Social History  . Marital Status: Married    Spouse Name: N/A  . Number of Children: N/A  . Years of Education: N/A   Social History Main Topics  . Smoking status: Former Smoker -- 0.25 packs/day for 20 years    Types: Cigarettes    Quit date: 09/08/1978  . Smokeless tobacco: Never Used     Comment: quit smokikng 71yrs ago  . Alcohol Use: No  . Drug Use: No  . Sexual Activity: No   Other Topics Concern  . Not on file   Social History Narrative   Family History  Problem  Relation Age of Onset  . Arthritis Mother   . Heart murmur Other    Allergies  Allergen Reactions  . Adhesive [Tape]     ?  Blister after knee surgery  . Clarithromycin   . Doxycycline   . Nickel   . Other     Metal and perfumes   Prior to Admission medications   Medication Sig Start Date End Date Taking? Authorizing Provider  albuterol (PROVENTIL HFA;VENTOLIN HFA) 108 (90 BASE) MCG/ACT inhaler Inhale 1 puff into the lungs every 6 (six) hours as needed for wheezing or shortness of breath.   Yes Historical Provider, MD  aspirin EC 325 MG tablet Take 1 tablet (325 mg total) by mouth daily. 11/07/13  Yes Phillips Hay, PA-C  beta carotene w/minerals (OCUVITE) tablet Take 1 tablet by mouth daily.   Yes Historical Provider, MD  carbamazepine (TEGRETOL) 200 MG tablet Take 400 mg by mouth 2 (two) times daily.    Yes Historical Provider, MD  Cyanocobalamin (VITAMIN B 12 PO) Take by mouth daily.     Yes Historical Provider, MD  esomeprazole (NEXIUM) 20 MG capsule Take 20 mg  by mouth daily before breakfast.   Yes Historical Provider, MD  fluticasone (FLONASE) 50 MCG/ACT nasal spray Place 2 sprays into both nostrils daily. 07/24/14  Yes Lucretia Kern, DO  levothyroxine (SYNTHROID, LEVOTHROID) 175 MCG tablet Take 175 mcg by mouth daily.     Yes Historical Provider, MD  lisinopril (PRINIVIL,ZESTRIL) 10 MG tablet TAKE 1 TABLET BY MOUTH EVERY DAY 07/24/14  Yes Lucretia Kern, DO  Multiple Vitamin (MULTIVITAMIN) capsule Take 1 capsule by mouth daily.     Yes Historical Provider, MD  Omega-3 Fatty Acids (FISH OIL PO) Take by mouth daily.     Yes Historical Provider, MD  pravastatin (PRAVACHOL) 20 MG tablet Take 1 tablet (20 mg total) by mouth daily. 07/21/14  Yes Lucretia Kern, DO  sertraline (ZOLOFT) 100 MG tablet TAKE 1 TABLET EVERY DAY 07/11/14  Yes Lucretia Kern, DO  ciprofloxacin (CIPRO) 250 MG tablet Take 1 tablet (250 mg total) by mouth 2 (two) times daily. 08/17/14   Lucretia Kern, DO     Positive ROS: All  other systems have been reviewed and were otherwise negative with the exception of those mentioned in the HPI and as above.  Physical Exam: General: Alert, no acute distress Cardiovascular: No pedal edema Respiratory: No cyanosis, no use of accessory musculature GI: abdomen soft Skin: No lesions in the area of chief complaint Neurologic: Sensation intact distally Psychiatric: Patient is competent for consent with normal mood and affect Lymphatic: no lymphedema  MUSCULOSKELETAL: exam stable  Assessment: left total knee arthroplasty pain  Plan: Plan for Procedure(s): LEFT TOTAL KNEE REVISION  The risks benefits and alternatives were discussed with the patient including but not limited to the risks of nonoperative treatment, versus surgical intervention including infection, bleeding, nerve injury,  blood clots, cardiopulmonary complications, morbidity, mortality, among others, and they were willing to proceed.   Marianna Payment, MD   09/19/2014 7:48 AM

## 2014-09-19 NOTE — Progress Notes (Signed)
Orthopedic Tech Progress Note Patient Details:  FREDDA CLARIDA 1945-09-25 [id number] CPM applied to LLE with appropriate settings. *Note: Patient could not handle CPM flexion set at 90 degrees, stated it was too painful. CPM flexion adjusted to 60 degrees and tolerated much better by patient. OHF applied to bed. CPM Left Knee CPM Left Knee: On Left Knee Flexion (Degrees): 60 Left Knee Extension (Degrees): 0   Asia R Thompson 09/19/2014, 5:04 PM

## 2014-09-19 NOTE — Brief Op Note (Signed)
   Brief Op Note  Date of Surgery: 09/19/2014  Preoperative Diagnosis: left total knee arthroplasty pain  Postoperative Diagnosis: same  Procedure: Procedure(s): LEFT TOTAL KNEE REVISION  Implants: Tamala Julian and Nephew  Surgeons: Surgeon(s): Raman Featherston Ephriam Jenkins, MD Mcarthur Rossetti, MD  Anesthesia: General  Drains: 1 HVAC  Estimated Blood Loss: See anesthesia record  Complications: None  Condition to PACU: Stable  Eldonna Neuenfeldt Eduard Roux, MD [place] 09/19/2014 4:00 PM

## 2014-09-19 NOTE — Anesthesia Procedure Notes (Addendum)
Spinal Patient location during procedure: OR Staffing Anesthesiologist: Suzette Battiest Performed by: anesthesiologist  Preanesthetic Checklist Completed: patient identified, site marked, surgical consent, pre-op evaluation, timeout performed, IV checked, risks and benefits discussed and monitors and equipment checked Spinal Block Patient position: sitting Prep: DuraPrep Patient monitoring: heart rate, continuous pulse ox and blood pressure Location: L4-5 Injection technique: single-shot Needle Needle type: Spinocan  Needle gauge: 24 G Needle length: 9 cm Additional Notes Expiration date of kit checked and confirmed. Patient tolerated procedure well, without complications.    Procedure Name: LMA Insertion Date/Time: 09/19/2014 2:17 PM Performed by: Vennie Homans Pre-anesthesia Checklist: Patient identified, Timeout performed, Emergency Drugs available, Suction available and Patient being monitored Patient Re-evaluated:Patient Re-evaluated prior to inductionOxygen Delivery Method: Circle system utilized Preoxygenation: Pre-oxygenation with 100% oxygen Intubation Type: IV induction Ventilation: Mask ventilation without difficulty LMA: LMA inserted LMA Size: 4.0 Placement Confirmation: breath sounds checked- equal and bilateral and positive ETCO2 Tube secured with: Tape Dental Injury: Teeth and Oropharynx as per pre-operative assessment

## 2014-09-19 NOTE — Anesthesia Preprocedure Evaluation (Addendum)
Anesthesia Evaluation  Patient identified by MRN, date of birth, ID band Patient awake    Reviewed: Allergy & Precautions, NPO status , Patient's Chart, lab work & pertinent test results  Airway Mallampati: II  TM Distance: >3 FB Neck ROM: Full    Dental   Pulmonary former smoker,  breath sounds clear to auscultation        Cardiovascular hypertension, Pt. on medications Rhythm:Regular Rate:Normal     Neuro/Psych Seizures -, Well Controlled,  Depression    GI/Hepatic Neg liver ROS, GERD-  ,  Endo/Other  Hypothyroidism Morbid obesity  Renal/GU negative Renal ROS     Musculoskeletal  (+) Arthritis -,   Abdominal   Peds  Hematology negative hematology ROS (+)   Anesthesia Other Findings   Reproductive/Obstetrics                            Lab Results  Component Value Date   WBC 6.2 09/08/2014   HGB 13.9 09/08/2014   HCT 40.9 09/08/2014   MCV 91.9 09/08/2014   PLT 218 09/08/2014   Lab Results  Component Value Date   CREATININE 0.66 09/08/2014   BUN 12 09/08/2014   NA 136 09/08/2014   K 4.6 09/08/2014   CL 100* 09/08/2014   CO2 28 09/08/2014    Anesthesia Physical Anesthesia Plan  ASA: III  Anesthesia Plan: Spinal and MAC   Post-op Pain Management:    Induction: Intravenous  Airway Management Planned: Natural Airway and Simple Face Mask  Additional Equipment:   Intra-op Plan:   Post-operative Plan:   Informed Consent: I have reviewed the patients History and Physical, chart, labs and discussed the procedure including the risks, benefits and alternatives for the proposed anesthesia with the patient or authorized representative who has indicated his/her understanding and acceptance.   Dental advisory given  Plan Discussed with: CRNA  Anesthesia Plan Comments:        Anesthesia Quick Evaluation

## 2014-09-19 NOTE — Op Note (Signed)
Date of surgery: 09/19/2014  Preoperative diagnosis: Painful left total knee replacement due to allergy to nickel, cobalt, chromium  Postoperative diagnosis: Same  Procedure: Left total knee revision of both the femoral and the tibia components  Implants: [employer] Legion Oxinium constrained size 5, Legion revision tibial baseplate size 4  Surgeon: Eduard Roux, M.D.  Asst. surgeon: Jean Rosenthal, M.D. necessary for the timely completion of the surgery.  Assistant: Benita Stabile, PA-C  Anesthesia: Spinal and general  Estimated blood loss: 100 mL  Drains: One medium Hemovac  Complications: None  Tourniquet time: 120 minutes  Condition to PACU: Stable  Indications for procedure: Ms. Fuhrman is a 69 year old female who underwent a left total knee replacement approximately a year ago and had chronic pain postoperatively. She had a chronic knee rash overlying the anterior knee. Allergy testing revealed that she was allergic to nickel, cobalt, and chromium. We reviewed the results of the test. An infection workup was negative. The patient elected to proceed with left total knee revision after being made aware of the risks of surgery that include infection, incomplete relief of pain, DVT, stiffness, neurovascular injury and medical and anesthesia risks.    Description of procedure: The patient was identified in the preoperative holding area. The operative site was marked by the surgeon and confirmed with the patient. She is brought back to the operating room and placed supine on the table. Spinal anesthesia was performed by the anesthesiologist. The spinal anesthesia was effective for approximately one hour and she experienced surgical pain and the anesthesiologist made the decision to give her general anesthesia at that time. A timeout was performed. Preoperative antibiosis were given. Tranexamic acid was infused. Nonsterile tourniquet was placed on the upper left thigh. Left lower  extremity was prepped and draped in standard sterile fashion. The extremity was exsanguinated using an Esmarch bandage and the tourniquet was inflated to 350 mmHg. We ellipsed out the old midline incision. Full-thickness flaps were created both medially and laterally. The old Ethibond sutures were cut out. A medial parapatellar arthrotomy was created. We performed the appropriate soft tissue releases to gain adequate exposure. We then remove the polyethylene liner. We then used small osteotomes to chisel around the femoral component we then removed the femoral component once this was loose. There was minimal bone loss. We then turned our attention to the tibia. We again used an osteotome to chisel underneath the baseplate. The tibial baseplate lifted off of the cement with ease. We then chiseled the cement mantle until all of this was removed from the proximal tibia. We then gained entry into the tibial canal and reamed until we had adequate chatter and then used intramedullary guide to perform a saw cut that was just approximately 1 millimeter deeper than the old tibial cut. Once this was done we then turned our attention to the femur and again we reamed the canal until there was adequate chatter we used intramedullary referencing and performed a new cut approximately a millimeter deeper than the original one to allow for a fresh bony surface. We then placed our trial components and found the appropriate rotation and placement of them.  We trialed a 15 mm constrained poly-and found that this was the appropriate size. We then took out all of the trial components and early irrigated the wound with pulse lavage. The final components were cemented in with antibiotic-impregnated cement.  The cement was allowed to cure.  Excess cement was removed.  We then placed our  final polyethylene liner. The knee was stable to varus and valgus stress at 0, 30, 90, 120.  The knee was then thoroughly irrigated again with pulse  lavage and a medium Hemovac was placed in the joint. The arthrotomy was then closed with interrupted #1 Vicryls suture. The subcutaneous fat was closed with interrupted 0 Vicryls.  The deep skin layer was closed with 2.0 Vicryls.  The skin was closed with a running 4.0 Monocryl.  Dermabond was placed on the incision and Steri-Strips were also placed on top of this. Sterile dressings were applied. The patient tolerated the procedure well and was extubated and transferred to the PACU in stable condition. All sponge counts were correct.  Postoperative plan: The patient will be weight bear as tolerated to the left lower extremity. She will be in a CPM from 0-60 and advance by 10 a day. We will remove the Hemovac drain when it is appropriate. She will be evaluated by physical therapy in the morning.  Azucena Cecil, MD [facility] [phone] 6:09 PM

## 2014-09-19 NOTE — Transfer of Care (Signed)
Immediate Anesthesia Transfer of Care Note  Patient: Beverly Ballard  Procedure(s) Performed: Procedure(s): LEFT TOTAL KNEE REVISION (Left)  Patient Location: PACU  Anesthesia Type:General and Spinal  Level of Consciousness: awake, alert , oriented, patient cooperative and responds to stimulation  Airway & Oxygen Therapy: Patient Spontanous Breathing and Patient connected to nasal cannula oxygen  Post-op Assessment: Report given to RN, Post -op Vital signs reviewed and stable, Patient moving all extremities X 4 and Patient able to stick tongue midline  Post vital signs: stable  Last Vitals:  Filed Vitals:   09/19/14 1051  BP: 181/72  Pulse: 76  Temp: 36.6 C  Resp: 18    Complications: No apparent anesthesia complications

## 2014-09-20 ENCOUNTER — Encounter (HOSPITAL_COMMUNITY): Payer: Self-pay | Admitting: Orthopaedic Surgery

## 2014-09-20 LAB — CBC
HEMATOCRIT: 32.1 % — AB (ref 36.0–46.0)
HEMOGLOBIN: 10.8 g/dL — AB (ref 12.0–15.0)
MCH: 30.9 pg (ref 26.0–34.0)
MCHC: 33.6 g/dL (ref 30.0–36.0)
MCV: 91.7 fL (ref 78.0–100.0)
Platelets: 185 10*3/uL (ref 150–400)
RBC: 3.5 MIL/uL — ABNORMAL LOW (ref 3.87–5.11)
RDW: 12.4 % (ref 11.5–15.5)
WBC: 8.3 10*3/uL (ref 4.0–10.5)

## 2014-09-20 LAB — BASIC METABOLIC PANEL
ANION GAP: 10 (ref 5–15)
BUN: 6 mg/dL (ref 6–20)
CHLORIDE: 96 mmol/L — AB (ref 101–111)
CO2: 25 mmol/L (ref 22–32)
CREATININE: 0.65 mg/dL (ref 0.44–1.00)
Calcium: 8 mg/dL — ABNORMAL LOW (ref 8.9–10.3)
GFR calc Af Amer: >60 mL/min (ref >60)
GFR calc non Af Amer: >60 mL/min (ref >60)
Glucose, Bld: 132 mg/dL — ABNORMAL HIGH (ref 65–99)
Potassium: 4.6 mmol/L (ref 3.5–5.1)
Sodium: 131 mmol/L — ABNORMAL LOW (ref 135–145)

## 2014-09-20 MED ORDER — DIAZEPAM 5 MG PO TABS: 5.0000 mg | ORAL_TABLET | Freq: Four times a day (QID) | ORAL | Status: DC | PRN

## 2014-09-20 NOTE — Care Management Note (Signed)
Case Management Note  Patient Details  Name: Beverly Ballard MRN: [id number] Date of Birth: Feb 12, 1946  Subjective/Objective:           Patient is from home;  s/p L TKA revision     Action/Plan: D/C with HHPT  Expected Discharge Date:        09/21/14          Expected Discharge Plan:  [place]  In-House Referral:     Discharge planning Services  CM Consult  Post Acute Care Choice:   (husband says patient has all DME from last surgery in 2015.) Choice offered to:     DME Arranged:    DME Agency:     HH Arranged:  PT (PT with Arville Go, arranged by doctor's office) [place] Agency:  [place]  Status of Service:  In process, will continue to follow  Medicare Important Message Given:    Date Medicare IM Given:    Medicare IM give by:    Date Additional Medicare IM Given:    Additional Medicare Important Message give by:     If discussed at [place] of Stay Meetings, dates discussed:    Additional Comments:  Dimas Aguas, RN 09/20/2014, 5:58 PM

## 2014-09-20 NOTE — Progress Notes (Signed)
09/20/14  1752  CM spoke with patient and husband in patient's room.  While patient dosed off, husband - [name], answered all questions and reports [facility] already called him earliertoday patient about the HHPT set-up.  Husband says he will be providing 24 hour supervision for patient.  Husband says patient has had this type of surgery before and will be using the DME from the last time.

## 2014-09-20 NOTE — Anesthesia Postprocedure Evaluation (Signed)
  Anesthesia Post-op Note  Patient: Beverly Ballard  Procedure(s) Performed: Procedure(s): LEFT TOTAL KNEE REVISION (Left)  Patient Location: PACU  Anesthesia Type:General and Spinal  Level of Consciousness: awake and alert   Airway and Oxygen Therapy: Patient Spontanous Breathing  Post-op Pain: mild  Post-op Assessment: Post-op Vital signs reviewed  Post-op Vital Signs: Reviewed  Last Vitals:  Filed Vitals:   09/20/14 1250  BP: 126/48  Pulse: 67  Temp: 36.7 C  Resp: 18    Complications: No apparent anesthesia complications

## 2014-09-20 NOTE — Progress Notes (Signed)
Physical Therapy Treatment Patient Details Name: Beverly Ballard MRN: [id number] DOB: Nov 01, 1945 Today's Date: 09/20/2014    History of Present Illness Patient is a 69 y/o female s/p L TKA revision. PMH includes HTN, HLD, anxiety, depression, migraines, seizures, gout, cancer, environment allergies.    PT Comments    Patient progressing well towards PT goals. Improved ambulation distance with Min guard assist for safety. Pt with 1 instance of knee instability during gait however no LOB. Will need to perform stair training next session as tolerated to prepare pt for d/c home. Reviewed HEP handout. Will continue to follow to maximize independence.  Follow Up Recommendations  Home health PT;Supervision/Assistance - 24 hour     Equipment Recommendations  None recommended by PT    Recommendations for Other Services       Precautions / Restrictions Precautions Precautions: Knee Precaution Booklet Issued: No Precaution Comments: Reviewed no pillow under knee and HEP. Restrictions Weight Bearing Restrictions: Yes LLE Weight Bearing: Weight bearing as tolerated    Mobility  Bed Mobility Overal bed mobility: Needs Assistance Bed Mobility: Sit to Supine       Sit to supine: Min guard;HOB elevated   General bed mobility comments: Took 2 attempts to bring LLE into bed however pt able to do it without assist.   Transfers Overall transfer level: Needs assistance Equipment used: Rolling walker (2 wheeled) Transfers: Sit to/from Stand Sit to Stand: Min guard         General transfer comment: Min guard for safety.   Ambulation/Gait Ambulation/Gait assistance: Min guard Ambulation Distance (Feet): 120 Feet Assistive device: Rolling walker (2 wheeled) Gait Pattern/deviations: Step-to pattern;Decreased stance time - left;Decreased step length - right;Trunk flexed   Gait velocity interpretation: Below normal speed for age/gender General Gait Details: Slow, unsteady gait. Cues for  RW management and increased WB through UEs to offload LLE. 1 instance of knee instability however pt able to catch self using RW.    Stairs            Wheelchair Mobility    Modified Rankin (Stroke Patients Only)       Balance Overall balance assessment: Needs assistance Sitting-balance support: Feet supported;No upper extremity supported Sitting balance-Leahy Scale: Good     Standing balance support: During functional activity Standing balance-Leahy Scale: Fair                      Cognition Arousal/Alertness: Awake/alert Behavior During Therapy: WFL for tasks assessed/performed Overall Cognitive Status: Within Functional Limits for tasks assessed                      Exercises Total Joint Exercises Long Arc Quad: Left;10 reps;Seated    General Comments General comments (skin integrity, edema, etc.): Pt's daughter present in room during session. Upon PT arrival, pt wtih yellow foam under LE and knee. Re-emphasized importance of no pillow under operative knee.      Pertinent Vitals/Pain Pain Assessment: Faces Pain Score: 4  Faces Pain Scale: Hurts little more Pain Location: left knee Pain Descriptors / Indicators: Sore Pain Intervention(s): Limited activity within patient's tolerance;Monitored during session;Repositioned;Premedicated before session    Home Living Family/patient expects to be discharged to:: Private residence Living Arrangements: Spouse/significant other Available Help at Discharge: Family;Available 24 hours/day Type of Home: House Home Access: Stairs to enter Entrance Stairs-Rails: Right Home Layout: One level Home Equipment: Walker - 2 wheels;Bedside commode;Adaptive equipment;Hand held shower head;Cane - single point  Prior Function Level of Independence: Independent          PT Goals (current goals can now be found in the care plan section) Acute Rehab PT Goals Patient Stated Goal: none stated Progress towards  PT goals: Progressing toward goals    Frequency  7X/week    PT Plan Current plan remains appropriate    Co-evaluation             End of Session Equipment Utilized During Treatment: Gait belt Activity Tolerance: Patient tolerated treatment well Patient left: in bed;with call bell/phone within reach;with family/visitor present     Time: 7340-3709 PT Time Calculation (min) (ACUTE ONLY): 24 min  Charges:  $Gait Training: 8-22 mins $Therapeutic Activity: 8-22 mins                    G Codes:      Makyiah Lie A Nalaysia Manganiello 09/20/2014, 3:44 PM  Wray Kearns, [place], DPT [phone]

## 2014-09-20 NOTE — Evaluation (Signed)
Occupational Therapy Evaluation Patient Details Name: Beverly Ballard MRN: [id number] DOB: Jun 19, 1945 Today's Date: 09/20/2014    History of Present Illness Patient is a 69 y/o female s/p L TKA revision. PMH includes HTN, HLD, anxiety, depression, migraines, seizures, gout, cancer, environment allergies.   Clinical Impression   OT education complete regarding ADL activity s/p TKR    Follow Up Recommendations  No OT follow up    Equipment Recommendations  None recommended by OT       Precautions / Restrictions Precautions Precautions: Knee Precaution Booklet Issued: No Precaution Comments: Reviewed no pillow under knee and HEP. Restrictions Weight Bearing Restrictions: Yes LLE Weight Bearing: Weight bearing as tolerated      Mobility Bed Mobility Overal bed mobility: Needs Assistance Bed Mobility: Supine to Sit     Supine to sit: Supervision;HOB elevated     General bed mobility comments: pt in chair  Transfers Overall transfer level: Needs assistance Equipment used: Rolling walker (2 wheeled) Transfers: Sit to/from [employer] Sit to Stand: Min guard         General transfer comment: Min guard for safety. + nausea upon standing. Stood from [employer], from toilet x1. Transferred to chair post ambulation bout.    Balance Overall balance assessment: Needs assistance Sitting-balance support: Feet supported;No upper extremity supported Sitting balance-Leahy Scale: Good     Standing balance support: During functional activity Standing balance-Leahy Scale: Fair                              ADL Overall ADL's : Needs assistance/impaired                         Toilet Transfer: Min guard;Regular Toilet;Comfort height toilet;Ambulation;RW;Grab bars;Cueing for sequencing;Cueing for safety   Toileting- Clothing Manipulation and Hygiene: Supervision/safety;Sit to/from stand       Functional mobility during ADLs: Minimal  assistance General ADL Comments: Pt overall S- min A .  Pts husband will A as needed.  Pt does not feel further OT needed at this time.                 Pertinent Vitals/Pain Pain Assessment: Faces Pain Score: 4  Faces Pain Scale: Hurts little more Pain Location: left knee Pain Descriptors / Indicators: Sore Pain Intervention(s): Patient requesting pain meds-RN notified;Repositioned;Monitored during session;Limited activity within patient's tolerance     Hand Dominance     Extremity/Trunk Assessment Upper Extremity Assessment Upper Extremity Assessment: Generalized weakness   Lower Extremity Assessment Lower Extremity Assessment: RLE deficits/detail RLE Deficits / Details: Limited AROM hip/lknee secondary to pain/surgery. RLE Sensation:  [facility].)       Communication Communication Communication: No difficulties   Cognition Arousal/Alertness: Awake/alert Behavior During Therapy: WFL for tasks assessed/performed Overall Cognitive Status: Within Functional Limits for tasks assessed                     General Comments       Exercises       Shoulder Instructions      Home Living Family/patient expects to be discharged to:: Private residence Living Arrangements: Spouse/significant other Available Help at Discharge: Family;Available 24 hours/day Type of Home: House Home Access: Stairs to enter [employer] of Steps: 5 Entrance Stairs-Rails: Right Home Layout: One level     Bathroom Shower/Tub: Occupational psychologist: Handicapped height     Home Equipment: Environmental consultant -  2 wheels;Bedside commode;Adaptive equipment;Hand held shower head;Cane - single point Adaptive Equipment: Reacher;Long-handled sponge        Prior Functioning/Environment Level of Independence: Independent                      OT Goals(Current goals can be found in the care plan section) Acute Rehab OT Goals Patient Stated Goal: none stated  OT Frequency:      Barriers to D/C:            Co-evaluation              End of Session Equipment Utilized During Treatment: Rolling walker CPM Left Knee CPM Left Knee: Off Additional Comments: Doffed at 1040am  Activity Tolerance: Patient tolerated treatment well Patient left: in chair;with call bell/phone within reach   Time: 1200-1220 OT Time Calculation (min): 20 min Charges:  OT General Charges $OT Visit: 1 Procedure OT Evaluation $Initial OT Evaluation Tier I: 1 Procedure G-Codes:    Betsy Pries 10-03-14, 12:26 PM

## 2014-09-20 NOTE — Progress Notes (Signed)
   Subjective:  Patient reports pain as moderate.  Severe o/n.  Objective:   VITALS:   Filed Vitals:   09/19/14 1745 09/19/14 2030 09/20/14 0146 09/20/14 0549  BP: 159/72 147/59 153/63 130/55  Pulse: 62 69 70 66  Temp: 97.7 F (36.5 C) 98.1 F (36.7 C) 97.8 F (36.6 C) 98.1 F (36.7 C)  TempSrc:  Oral    Resp: 10 15 16 18   Weight:      SpO2: 100% 100% 100% 100%    Neurologically intact Neurovascular intact Sensation intact distally Intact pulses distally Dorsiflexion/Plantar flexion intact Incision: dressing C/D/I and no drainage No cellulitis present Compartment soft   Lab Results  Component Value Date   WBC 6.2 09/08/2014   HGB 13.9 09/08/2014   HCT 40.9 09/08/2014   MCV 91.9 09/08/2014   PLT 218 09/08/2014     Assessment/Plan:  1 Day Post-Op   - Expected postop acute blood loss anemia - will monitor for symptoms - Up with PT/OT - DVT ppx - SCDs, ambulation, asa - WBAT left lower extremity - Pain control - patient sensitive to opioids - HVAC removed - foley out  Marianna Payment 09/20/2014, 8:15 AM [phone]

## 2014-09-20 NOTE — Progress Notes (Signed)
Orthopedic Tech Progress Note Patient Details:  Beverly Ballard [birth year] [id number] On cpm at 7:15 pm Patient ID: [name], female   DOB: October 06, 1945, 68 y.o.   MRN: [id number]   Braulio Bosch 09/20/2014, 7:16 PM

## 2014-09-20 NOTE — Evaluation (Signed)
Physical Therapy Evaluation Patient Details Name: Beverly Ballard MRN: [id number] DOB: 1945-06-30 Today's Date: 09/20/2014   History of Present Illness  Patient is a 69 y/o female s/p L TKA revision. PMH includes HTN, HLD, anxiety, depression, migraines, seizures, gout, cancer, environment allergies.  Clinical Impression  Patient presents with pain and post surgical deficits LLE s/p above surgery impacting mobility. Education provided on there ex. Tolerated short distance ambulation with Min guard assist for safety. Pt will have 24/7 S at home from spouse. Would benefit from skilled PT to maximize independence and mobility prior to return home.    Follow Up Recommendations Home health PT;Supervision/Assistance - 24 hour    Equipment Recommendations  None recommended by PT    Recommendations for Other Services       Precautions / Restrictions Precautions Precautions: Knee Precaution Booklet Issued: No Precaution Comments: Reviewed no pillow under knee and HEP. Restrictions Weight Bearing Restrictions: Yes LLE Weight Bearing: Weight bearing as tolerated      Mobility  Bed Mobility Overal bed mobility: Needs Assistance Bed Mobility: Supine to Sit     Supine to sit: Supervision;HOB elevated     General bed mobility comments: Use of rails for support.   Transfers Overall transfer level: Needs assistance Equipment used: Rolling walker (2 wheeled) Transfers: Sit to/from Stand Sit to Stand: Min guard         General transfer comment: Min guard for safety. + nausea upon standing. Stood from [employer], from toilet x1. Transferred to chair post ambulation bout.  Ambulation/Gait Ambulation/Gait assistance: Min guard Ambulation Distance (Feet): 15 Feet (x2 bouts) Assistive device: Rolling walker (2 wheeled) Gait Pattern/deviations: Step-to pattern;Decreased stance time - left;Decreased step length - right;Trunk flexed   Gait velocity interpretation: Below normal speed for  age/gender General Gait Details: Slow, unsteady gait. Cues for RW management and increased WB through UEs to offload LLE.  Stairs            Wheelchair Mobility    Modified Rankin (Stroke Patients Only)       Balance Overall balance assessment: Needs assistance Sitting-balance support: Feet supported;No upper extremity supported Sitting balance-Leahy Scale: Good     Standing balance support: During functional activity Standing balance-Leahy Scale: Fair                               Pertinent Vitals/Pain Pain Assessment: Faces Faces Pain Scale: Hurts little more Pain Location: left knee Pain Descriptors / Indicators: Sore;Aching Pain Intervention(s): Monitored during session;Premedicated before session;Repositioned    Home Living Family/patient expects to be discharged to:: Private residence Living Arrangements: Spouse/significant other Available Help at Discharge: Family;Available 24 hours/day Type of Home: House Home Access: Stairs to enter Entrance Stairs-Rails: Right Entrance Stairs-Number of Steps: 5   Home Equipment: Walker - 2 wheels;Bedside commode;Adaptive equipment;Hand held shower head;Cane - single point      Prior Function Level of Independence: Independent               Hand Dominance        Extremity/Trunk Assessment   Upper Extremity Assessment: Defer to OT evaluation           Lower Extremity Assessment: RLE deficits/detail RLE Deficits / Details: Limited AROM hip/lknee secondary to pain/surgery.       Communication   Communication: No difficulties  Cognition Arousal/Alertness: Awake/alert Behavior During Therapy: WFL for tasks assessed/performed Overall Cognitive Status: Within Functional Limits for tasks assessed  General Comments      Exercises Total Joint Exercises Ankle Circles/Pumps: Both;15 reps;Supine Quad Sets: Left;5 reps;Supine Goniometric ROM: 10-80 degrees knee  AROM.      Assessment/Plan    PT Assessment Patient needs continued PT services  PT Diagnosis Difficulty walking;Acute pain   PT Problem List Decreased strength;Pain;Decreased range of motion;Decreased activity tolerance;Decreased balance;Decreased mobility  PT Treatment Interventions Balance training;Gait training;Stair training;Functional mobility training;Therapeutic exercise;Therapeutic activities;Patient/family education   PT Goals (Current goals can be found in the Care Plan section) Acute Rehab PT Goals Patient Stated Goal: none stated PT Goal Formulation: With patient Time For Goal Achievement: 10/04/14 Potential to Achieve Goals: Fair    Frequency 7X/week   Barriers to discharge Inaccessible home environment Pt has to climb 5 steps to enter home    Co-evaluation               End of Session Equipment Utilized During Treatment: Gait belt Activity Tolerance: Patient tolerated treatment well;Patient limited by pain Patient left: in chair;with call bell/phone within reach Nurse Communication: Mobility status         Time: 1034-1100 PT Time Calculation (min) (ACUTE ONLY): 26 min   Charges:   PT Evaluation $Initial PT Evaluation Tier I: 1 Procedure PT Treatments $Therapeutic Activity: 8-22 mins   PT G Codes:        Courtnay Petrilla A Azyiah Bo 09/20/2014, 11:07 AM Wray Kearns, PT, DPT [phone]

## 2014-09-21 ENCOUNTER — Encounter (HOSPITAL_COMMUNITY): Payer: Self-pay | Admitting: Orthopaedic Surgery

## 2014-09-21 LAB — CBC
HCT: 29.7 % — ABNORMAL LOW (ref 36.0–46.0)
HEMOGLOBIN: 10.1 g/dL — AB (ref 12.0–15.0)
MCH: 31.3 pg (ref 26.0–34.0)
MCHC: 34 g/dL (ref 30.0–36.0)
MCV: 92 fL (ref 78.0–100.0)
Platelets: 162 10*3/uL (ref 150–400)
RBC: 3.23 MIL/uL — ABNORMAL LOW (ref 3.87–5.11)
RDW: 12.6 % (ref 11.5–15.5)
WBC: 7.9 10*3/uL (ref 4.0–10.5)

## 2014-09-21 MED ORDER — ASPIRIN EC 325 MG PO TBEC: 325.0000 mg | DELAYED_RELEASE_TABLET | Freq: Two times a day (BID) | ORAL | Status: DC

## 2014-09-21 MED ORDER — SENNOSIDES-DOCUSATE SODIUM 8.6-50 MG PO TABS: 1.0000 | ORAL_TABLET | Freq: Every evening | ORAL | Status: DC | PRN

## 2014-09-21 MED ORDER — HYDROCODONE-ACETAMINOPHEN 7.5-325 MG PO TABS: 1.0000 | ORAL_TABLET | Freq: Four times a day (QID) | ORAL | Status: DC | PRN

## 2014-09-21 MED ORDER — DIAZEPAM 5 MG PO TABS: 5.0000 mg | ORAL_TABLET | Freq: Four times a day (QID) | ORAL | Status: DC | PRN

## 2014-09-21 MED ORDER — OXYCODONE HCL 5 MG PO TABS: 5.0000 mg | ORAL_TABLET | ORAL | Status: DC | PRN

## 2014-09-21 MED ORDER — HYDROCODONE-ACETAMINOPHEN 7.5-325 MG PO TABS
1.0000 | ORAL_TABLET | ORAL | Status: DC | PRN
  Administered 2014-09-21: 2 via ORAL
  Filled 2014-09-21: qty 2

## 2014-09-21 MED ORDER — METHOCARBAMOL 500 MG PO TABS: 500.0000 mg | ORAL_TABLET | Freq: Four times a day (QID) | ORAL | Status: DC | PRN

## 2014-09-21 NOTE — Discharge Instructions (Signed)

## 2014-09-21 NOTE — Care Management Note (Signed)
Case Management Note  Patient Details  Name: Beverly Ballard MRN: [id number] Date of Birth: 06/30/1945  Subjective/Objective:                    Action/Plan:   Expected Discharge Date: 09/22/14                 Expected Discharge Plan:  [place]  In-House Referral:     Discharge planning Services  CM Consult  Post Acute Care Choice:   (husband says patient has all DME from last surgery in 2015.) Choice offered to:  Patient  DME Arranged:    DME Agency:     HH Arranged:  PT (PT with Ar[facility] Go, arranged by doctor's office) [place] Agency:  [place]  Status of Service:  Completed, signed off  Medicare Important Message Given:    Date Medicare IM Given:    Medicare IM give by:    Date Additional Medicare IM Given:    Additional Medicare Important Message give by:     If discussed at [place] of Stay Meetings, dates discussed:    Additional Comments:  Ninfa Meeker, RN 09/21/2014, 2:15 PM

## 2014-09-21 NOTE — Progress Notes (Signed)
Physical Therapy Treatment Patient Details Name: Beverly Ballard MRN: [id number] DOB: 03-06-1946 Today's Date: 09/21/2014    History of Present Illness Patient is a 69 y/o female s/p L TKA revision. PMH includes HTN, HLD, anxiety, depression, migraines, seizures, gout, cancer, environment allergies.    PT Comments    Patient progressing well towards PT goals. Performed stair training this PM with husband present. Swelling, pain and erythema present left knee- applied ice. Pt ambulating and transferring supervision level. Reviewed HEP and handout. Safe to discharge from a mobility standpoint. Will follow up if still in hospital tomorrow.   Follow Up Recommendations  Home health PT;Supervision/Assistance - 24 hour     Equipment Recommendations  None recommended by PT    Recommendations for Other Services       Precautions / Restrictions Precautions Precautions: Knee Precaution Booklet Issued: Yes (comment) Precaution Comments: Reviewed no pillow under knee and HEP. Restrictions Weight Bearing Restrictions: Yes LLE Weight Bearing: Weight bearing as tolerated    Mobility  Bed Mobility Overal bed mobility: Needs Assistance Bed Mobility: Supine to Sit     Supine to sit: Supervision     General bed mobility comments: Use of rail for support.   Transfers Overall transfer level: Needs assistance Equipment used: Rolling walker (2 wheeled) Transfers: Sit to/from Stand Sit to Stand: Supervision         General transfer comment: Supervision for safety. Good technique. Transferred on/off toilet Mod I.  Ambulation/Gait Ambulation/Gait assistance: Supervision Ambulation Distance (Feet): 140 Feet Assistive device: Rolling walker (2 wheeled) Gait Pattern/deviations: Step-through pattern;Decreased stance time - left;Decreased step length - right;Trunk flexed   Gait velocity interpretation: Below normal speed for age/gender General Gait Details: Slow, steady gait. Cues for knee  extension for quad activation during stance phase LLE.    Stairs Stairs: Yes Stairs assistance: Min guard Stair Management: Step to pattern;One rail Right Number of Stairs: 5 General stair comments: Reviewed technique. Husband present for stair training.  Wheelchair Mobility    Modified Rankin (Stroke Patients Only)       Balance Overall balance assessment: Needs assistance Sitting-balance support: Feet supported;No upper extremity supported Sitting balance-Leahy Scale: Good     Standing balance support: During functional activity Standing balance-Leahy Scale: Fair                      Cognition Arousal/Alertness: Awake/alert Behavior During Therapy: WFL for tasks assessed/performed Overall Cognitive Status: Within Functional Limits for tasks assessed                      Exercises      General Comments        Pertinent Vitals/Pain Pain Assessment: Faces Faces Pain Scale: Hurts whole lot Pain Location: left knee Pain Descriptors / Indicators: Sore;Aching Pain Intervention(s): Limited activity within patient's tolerance;Monitored during session;Repositioned;Patient requesting pain meds-RN notified;Ice applied    Home Living                      Prior Function            PT Goals (current goals can now be found in the care plan section) Progress towards PT goals: Progressing toward goals    Frequency  7X/week    PT Plan Current plan remains appropriate    Co-evaluation             End of Session Equipment Utilized During Treatment: Gait belt Activity Tolerance: Patient tolerated treatment  well Patient left: in chair;with call bell/phone within reach;with family/visitor present;with nursing/sitter in room     Time: 2081-3887 PT Time Calculation (min) (ACUTE ONLY): 22 min  Charges:  $Gait Training: 8-22 mins                    G Codes:      Beverly Ballard 09/21/2014, 4:27 PM Wray Kearns, [place],  DPT [phone]

## 2014-09-21 NOTE — Progress Notes (Signed)
DC instructions given to patient and spouse. Questions answered, wound care, exercises, and medications emphasized. Rx given. All belongings sent home with pt. PIV DC, hemostasis achieved. Will continue to monitor until time of DC. DC by wheelchair via NT to private vehicle driven by spouse.

## 2014-09-21 NOTE — Progress Notes (Signed)
Physical Therapy Treatment Patient Details Name: [name] MRN: [id number] DOB: Mar 05, 1946 Today's Date: 09/21/2014    History of Present Illness Patient is a 69 y/o female s/p L TKA revision. PMH includes HTN, HLD, anxiety, depression, migraines, seizures, gout, cancer, environment allergies.    PT Comments    Patient progressing well towards PT goals. Performed stair training with min guard assist for safety. Pt reports instability in left knee during gait training at times however no knee buckling visible. Tolerated there ex. Will follow up in PM to practice stair negotiation and review HEP prior to d/c.   Follow Up Recommendations  Home health PT;Supervision/Assistance - 24 hour     Equipment Recommendations  None recommended by PT    Recommendations for Other Services       Precautions / Restrictions Precautions Precautions: Knee Precaution Booklet Issued: No Precaution Comments: Reviewed no pillow under knee and HEP. Restrictions Weight Bearing Restrictions: Yes LLE Weight Bearing: Weight bearing as tolerated    Mobility  Bed Mobility               General bed mobility comments: Sitting in chair upon PT arrival.   Transfers Overall transfer level: Needs assistance Equipment used: Rolling walker (2 wheeled) Transfers: Sit to/from Stand Sit to Stand: Supervision         General transfer comment: Supervision for safety. Good technique.  Ambulation/Gait Ambulation/Gait assistance: Supervision Ambulation Distance (Feet): 140 Feet Assistive device: Rolling walker (2 wheeled) Gait Pattern/deviations: Step-through pattern;Decreased stance time - left;Decreased step length - right   Gait velocity interpretation: Below normal speed for age/gender General Gait Details: Slow, steady gait. Cues for knee extension for quad activation during stance phase LLE.    Stairs Stairs: Yes Stairs assistance: Min guard Stair Management: Step to pattern;One rail  Right Number of Stairs: 5 General stair comments: Cues for technique.   Wheelchair Mobility    Modified Rankin (Stroke Patients Only)       Balance Overall balance assessment: Needs assistance Sitting-balance support: Feet supported;No upper extremity supported Sitting balance-Leahy Scale: Good     Standing balance support: During functional activity Standing balance-Leahy Scale: Fair                      Cognition Arousal/Alertness: Awake/alert Behavior During Therapy: WFL for tasks assessed/performed Overall Cognitive Status: Within Functional Limits for tasks assessed                      Exercises Total Joint Exercises Quad Sets: Left;5 reps;Supine Heel Slides: Left;10 reps;Seated Long Arc Quad: Left;10 reps;Seated Goniometric ROM: 5-78 degrees knee AROM.    General Comments        Pertinent Vitals/Pain Pain Assessment: 0-10 Pain Score: 6  Pain Location: left knee Pain Descriptors / Indicators: Sore Pain Intervention(s): Monitored during session;Repositioned;Ice applied    Home Living                      Prior Function            PT Goals (current goals can now be found in the care plan section) Progress towards PT goals: Progressing toward goals    Frequency  7X/week    PT Plan Current plan remains appropriate    Co-evaluation             End of Session Equipment Utilized During Treatment: Gait belt Activity Tolerance: Patient tolerated treatment well Patient left: in chair;with call  bell/phone within reach;with family/visitor present     Time: 0913-0938 PT Time Calculation (min) (ACUTE ONLY): 25 min  Charges:  $Gait Training: 23-37 mins                    G Codes:      Trishia Cuthrell A Aldrick Derrig 09/21/2014, 9:50 AM Wray Kearns, PT, DPT [phone]

## 2014-09-21 NOTE — Progress Notes (Signed)
   Subjective:  Patient reports pain as moderate  Objective:   VITALS:   Filed Vitals:   09/20/14 0549 09/20/14 1250 09/20/14 1948 09/21/14 0553  BP: 130/55 126/48 128/50 134/54  Pulse: 66 67 65 70  Temp: 98.1 F (36.7 C) 98 F (36.7 C) 98.3 F (36.8 C) 98.3 F (36.8 C)  TempSrc:   Oral Oral  Resp: 18 18 16 17   Weight:      SpO2: 100% 99% 99% 97%    Incision c/d/i   Lab Results  Component Value Date   WBC 7.9 09/21/2014   HGB 10.1* 09/21/2014   HCT 29.7* 09/21/2014   MCV 92.0 09/21/2014   PLT 162 09/21/2014     Assessment/Plan:  2 Days Post-Op   - Expected postop acute blood loss anemia - will monitor for symptoms - Up with PT/OT - DVT ppx - SCDs, ambulation, asa - WBAT left lower extremity - Pain control - patient sensitive to opioids - possible d/c home today  - Rx in chart  Marianna Payment 09/21/2014, 8:01 AM [phone]

## 2014-09-22 ENCOUNTER — Encounter (HOSPITAL_COMMUNITY): Payer: Self-pay | Admitting: Orthopaedic Surgery

## 2014-09-22 NOTE — Discharge Summary (Signed)
Physician Discharge Summary      Patient ID: Beverly Ballard MRN: [id number] DOB/AGE: Jun 10, 1945 69 y.o.  Admit date: 09/19/2014 Discharge date: 09/22/2014  Admission Diagnoses:  <principal problem not specified>  Discharge Diagnoses:  Active Problems:   S/P revision of total knee   Past Medical History  Diagnosis Date  . ALLERGIC RHINITIS 05/27/2007    uses Flonase daily as needed and ALbuterol inhaler prn;gets Allergy shots  . Anxiety and depression 12/19/2009  . LOW BACK PAIN 05/27/2007  . Essential hypertension, benign 06/26/2009  . Hyperlipidemia     takes Pravastatin daily  . History of migraine many yrs ago  . Seizure disorder     reports remote, takes Tegretol daily, followed by neurology  . Numbness     left toes   . Arthritis   . History of gout   . GERD 05/27/2007    takes Nexium daily  . Constipation   . Diverticulosis   . Insomnia     but doesn't take any meds  . Heart murmur     yrs ago  . Environmental allergies   . Hypothyroidism   . Depression   . Seizures     last 85  . Cancer     vaginal    Surgeries: Procedure(s): LEFT TOTAL KNEE REVISION on 09/19/2014   Consultants (if any):    Discharged Condition: Improved  Hospital Course: Beverly Ballard is an 69 y.o. female who was admitted 09/19/2014 with a diagnosis of <principal problem not specified> and went to the operating room on 09/19/2014 and underwent the above named procedures.    She was given perioperative antibiotics:  Anti-infectives    Start     Dose/Rate Route Frequency Ordered Stop   09/19/14 1830  ceFAZolin (ANCEF) IVPB 2 g/50 mL premix     2 g 100 mL/hr over 30 Minutes Intravenous Every 6 hours 09/19/14 1817 09/20/14 0106   09/19/14 1215  ceFAZolin (ANCEF) IVPB 2 g/50 mL premix     2 g 100 mL/hr over 30 Minutes Intravenous To Surgery 09/18/14 0832 09/19/14 1226    .  She was given sequential compression devices, early ambulation, and aspirin for DVT prophylaxis.  She benefited  maximally from the hospital stay and there were no complications.    Recent vital signs:  Filed Vitals:   09/21/14 1300  BP: 140/52  Pulse: 71  Temp: 98.5 F (36.9 C)  Resp: 16    Recent laboratory studies:  Lab Results  Component Value Date   HGB 10.1* 09/21/2014   HGB 10.8* 09/20/2014   HGB 13.9 09/08/2014   Lab Results  Component Value Date   WBC 7.9 09/21/2014   PLT 162 09/21/2014   Lab Results  Component Value Date   INR 1.13 09/08/2014   Lab Results  Component Value Date   NA 131* 09/20/2014   K 4.6 09/20/2014   CL 96* 09/20/2014   CO2 25 09/20/2014   BUN 6 09/20/2014   CREATININE 0.65 09/20/2014   GLUCOSE 132* 09/20/2014    Discharge Medications:     Medication List    TAKE these medications        albuterol 108 (90 BASE) MCG/ACT inhaler  Commonly known as:  PROVENTIL HFA;VENTOLIN HFA  Inhale 1 puff into the lungs every 6 (six) hours as needed for wheezing or shortness of breath.     aspirin EC 325 MG tablet  Take 1 tablet (325 mg total) by mouth 2 (two) times daily.  beta carotene w/minerals tablet  Take 1 tablet by mouth daily.     carbamazepine 200 MG tablet  Commonly known as:  TEGRETOL  Take 400 mg by mouth 2 (two) times daily.     ciprofloxacin 250 MG tablet  Commonly known as:  CIPRO  Take 1 tablet (250 mg total) by mouth 2 (two) times daily.     diazepam 5 MG tablet  Commonly known as:  VALIUM  Take 1 tablet (5 mg total) by mouth every 6 (six) hours as needed for muscle spasms.     esomeprazole 20 MG capsule  Commonly known as:  NEXIUM  Take 20 mg by mouth daily before breakfast.     FISH OIL PO  Take by mouth daily.     fluticasone 50 MCG/ACT nasal spray  Commonly known as:  FLONASE  Place 2 sprays into both nostrils daily.     HYDROcodone-acetaminophen 7.5-325 MG per tablet  Commonly known as:  NORCO  Take 1-2 tablets by mouth every 6 (six) hours as needed for moderate pain.     levothyroxine 175 MCG tablet    Commonly known as:  SYNTHROID, LEVOTHROID  Take 175 mcg by mouth daily.     lisinopril 10 MG tablet  Commonly known as:  PRINIVIL,ZESTRIL  TAKE 1 TABLET BY MOUTH EVERY DAY     methocarbamol 500 MG tablet  Commonly known as:  ROBAXIN  Take 1 tablet (500 mg total) by mouth every 6 (six) hours as needed for muscle spasms.     multivitamin capsule  Take 1 capsule by mouth daily.     oxyCODONE 5 MG immediate release tablet  Commonly known as:  Oxy IR/ROXICODONE  Take 1-3 tablets (5-15 mg total) by mouth every 4 (four) hours as needed.     pravastatin 20 MG tablet  Commonly known as:  PRAVACHOL  Take 1 tablet (20 mg total) by mouth daily.     senna-docusate 8.6-50 MG per tablet  Commonly known as:  SENOKOT S  Take 1 tablet by mouth at bedtime as needed.  Notes to Patient:  For mild constipation     sertraline 100 MG tablet  Commonly known as:  ZOLOFT  TAKE 1 TABLET EVERY DAY     VITAMIN B 12 PO  Take by mouth daily.        Diagnostic Studies: Dg Knee Left Port  09/19/2014   CLINICAL DATA:  Left total knee prosthesis postoperative image  EXAM: PORTABLE LEFT KNEE - 1-2 VIEW  COMPARISON:  10311  FINDINGS: There is a total knee prosthesis with components in anticipated position. Underlying native bone appears intact. There is soft tissue postsurgical change with a postsurgical drain in the knee joint. There is a postsurgical joint effusion as well.  IMPRESSION: Anticipated postoperative appearance   Electronically Signed   By: Skipper Cliche M.D.   On: 09/19/2014 17:21    Disposition: 01-Home or Self Care      Discharge Instructions    Call MD / Call 911    Complete by:  As directed   If you experience chest pain or shortness of breath, CALL 911 and be transported to the hospital emergency room.  If you develope a fever above 101.5 F, pus (white drainage) or increased drainage or redness at the wound, or calf pain, call your surgeon's office.     Constipation Prevention     Complete by:  As directed   Drink plenty of fluids.  Prune juice may be  helpful.  You may use a stool softener, such as Colace (over the counter) 100 mg twice a day.  Use MiraLax (over the counter) for constipation as needed.     Diet - low sodium heart healthy    Complete by:  As directed      Diet general    Complete by:  As directed      Driving restrictions    Complete by:  As directed   No driving while taking narcotic pain meds.     Increase activity slowly as tolerated    Complete by:  As directed            Follow-up Information    Follow up with Marianna Payment, MD In 2 weeks.   Specialty:  Orthopedic Surgery   Why:  For suture removal, For wound re-check   Contact information:   [place] [id number] [phone]       Follow up with [facility].   Why:  Someone from [facility] will contact you concerning start date and time for therapy.   Contact information:   [place] [address] [phone]        Signed: Marianna Payment 09/22/2014, 8:16 AM

## 2014-09-27 ENCOUNTER — Encounter (HOSPITAL_COMMUNITY): Payer: Self-pay | Admitting: Orthopaedic Surgery

## 2014-10-09 ENCOUNTER — Other Ambulatory Visit: Payer: Self-pay | Admitting: Family Medicine

## 2014-12-12 ENCOUNTER — Ambulatory Visit (INDEPENDENT_AMBULATORY_CARE_PROVIDER_SITE_OTHER): Payer: Medicare HMO | Admitting: Family Medicine

## 2014-12-12 ENCOUNTER — Encounter: Payer: Self-pay | Admitting: Family Medicine

## 2014-12-12 ENCOUNTER — Ambulatory Visit (INDEPENDENT_AMBULATORY_CARE_PROVIDER_SITE_OTHER)
Admission: RE | Admit: 2014-12-12 | Discharge: 2014-12-12 | Disposition: A | Discharge status: Home / Self Care | Payer: Medicare HMO | Source: Ambulatory Visit | Attending: Family Medicine | Admitting: Family Medicine

## 2014-12-12 VITALS — BP 130/82 | HR 89 | Temp 98.7°F | Ht 65.0 in | Wt 224.4 lb

## 2014-12-12 DIAGNOSIS — K219 Gastro-esophageal reflux disease without esophagitis: Secondary | ICD-10-CM | POA: Diagnosis not present

## 2014-12-12 DIAGNOSIS — J309 Allergic rhinitis, unspecified: Secondary | ICD-10-CM

## 2014-12-12 DIAGNOSIS — R059 Cough, unspecified: Secondary | ICD-10-CM

## 2014-12-12 DIAGNOSIS — E039 Hypothyroidism, unspecified: Secondary | ICD-10-CM | POA: Diagnosis not present

## 2014-12-12 DIAGNOSIS — R05 Cough: Secondary | ICD-10-CM | POA: Diagnosis not present

## 2014-12-12 DIAGNOSIS — R110 Nausea: Secondary | ICD-10-CM | POA: Diagnosis not present

## 2014-12-12 LAB — CBC WITH DIFFERENTIAL/PLATELET
BASOS ABS: 0 10*3/uL (ref 0.0–0.1)
Basophils Relative: 0.3 % (ref 0.0–3.0)
EOS PCT: 0 % (ref 0.0–5.0)
Eosinophils Absolute: 0 10*3/uL (ref 0.0–0.7)
HEMATOCRIT: 40.7 % (ref 36.0–46.0)
Hemoglobin: 13.8 g/dL (ref 12.0–15.0)
LYMPHS PCT: 29.7 % (ref 12.0–46.0)
Lymphs Abs: 1.5 10*3/uL (ref 0.7–4.0)
MCHC: 33.9 g/dL (ref 30.0–36.0)
MCV: 93.8 fl (ref 78.0–100.0)
MONOS PCT: 8.7 % (ref 3.0–12.0)
Monocytes Absolute: 0.4 10*3/uL (ref 0.1–1.0)
NEUTROS ABS: 3.1 10*3/uL (ref 1.4–7.7)
Neutrophils Relative %: 61.3 % (ref 43.0–77.0)
Platelets: 245 10*3/uL (ref 150.0–400.0)
RBC: 4.34 Mil/uL (ref 3.87–5.11)
RDW: 12.5 % (ref 11.5–15.5)
WBC: 5.1 10*3/uL (ref 4.0–10.5)

## 2014-12-12 LAB — BASIC METABOLIC PANEL
BUN: 10 mg/dL (ref 6–23)
CHLORIDE: 94 meq/L — AB (ref 96–112)
CO2: 30 meq/L (ref 19–32)
CREATININE: 0.59 mg/dL (ref 0.40–1.20)
Calcium: 9.4 mg/dL (ref 8.4–10.5)
GFR: 108.11 mL/min (ref >60.00)
Glucose, Bld: 93 mg/dL (ref 70–99)
Potassium: 4.6 mEq/L (ref 3.5–5.1)
Sodium: 129 mEq/L — ABNORMAL LOW (ref 135–145)

## 2014-12-12 LAB — TSH: TSH: 2.21 u[IU]/mL (ref 0.35–4.50)

## 2014-12-12 NOTE — Patient Instructions (Signed)
BEFORE YOU LEAVE: -xray sheet -follow up in 2 weeks -labs  Go get chest xray  Nexium 40 mg dialy for 2 weeks  Align or culturelle probiotic and take daily for 2 weeks  Flonase daily per instructions

## 2014-12-12 NOTE — Progress Notes (Signed)
Pre visit review using our clinic review tool, if applicable. No additional management support is needed unless otherwise documented below in the visit note. 

## 2014-12-12 NOTE — Progress Notes (Signed)
HPI:  Acute visit for nausea and vomiting:  Cough/Nausea: -started: 2 weeks ago -symptoms: nausea, PND, cough, achy, fatigue, subjective fever, occ heartburn -denies: SOB, documented fevers, vomiting, diarrhea, sig constipation, sinus pain, tooth pain, abd pain, urinary symptoms, blood in stools, weight loss -PMH sig for GERD, on nexium; chronic constipation, normal colonoscopy in 2009   ROS: See pertinent positives and negatives per HPI.  Past Medical History  Diagnosis Date  . ALLERGIC RHINITIS 05/27/2007    uses Flonase daily as needed and ALbuterol inhaler prn;gets Allergy shots  . Anxiety and depression 12/19/2009  . LOW BACK PAIN 05/27/2007  . Essential hypertension, benign 06/26/2009  . Hyperlipidemia     takes Pravastatin daily  . History of migraine many yrs ago  . Seizure disorder     reports remote, takes Tegretol daily, followed by neurology  . Numbness     left toes   . Arthritis   . History of gout   . GERD 05/27/2007    takes Nexium daily  . Constipation   . Diverticulosis   . Insomnia     but doesn't take any meds  . Heart murmur     yrs ago  . Environmental allergies   . Hypothyroidism   . Depression   . Seizures     last 85  . Cancer     vaginal    Past Surgical History  Procedure Laterality Date  . Heel spurs Bilateral   . Tonsillectomy    . Adenoidectomy    . Colonoscopy    . Esophagogastroduodenoscopy    . Knee arthroplasty Left 11/07/2013    Procedure: COMPUTER ASSISTED TOTAL KNEE ARTHROPLASTY;  Surgeon: Marybelle Killings, MD;  Location: [facility];  Service: Orthopedics;  Laterality: Left;  Left Total Knee Arthroplasty, Cemented, Computer Assist  . Back surgery      x2  . Total knee revision Left 09/19/2014    Procedure: LEFT TOTAL KNEE REVISION;  Surgeon: Leandrew Koyanagi, MD;  Location: [facility];  Service: Orthopedics;  Laterality: Left;    Family History  Problem Relation Age of Onset  . Arthritis Mother   . Heart murmur Other     Social History    Social History  . Marital Status: Married    Spouse Name: N/A  . Number of Children: N/A  . Years of Education: N/A   Social History Main Topics  . Smoking status: Former Smoker -- 0.25 packs/day for 20 years    Types: Cigarettes    Quit date: 09/08/1978  . Smokeless tobacco: Never Used     Comment: quit smokikng 21yrs ago  . Alcohol Use: No  . Drug Use: No  . Sexual Activity: No   Other Topics Concern  . None   Social History Narrative     Current outpatient prescriptions:  .  albuterol (PROVENTIL HFA;VENTOLIN HFA) 108 (90 BASE) MCG/ACT inhaler, Inhale 1 puff into the lungs every 6 (six) hours as needed for wheezing or shortness of breath., Disp: , Rfl:  .  aspirin 81 MG tablet, Take 81 mg by mouth daily., Disp: , Rfl:  .  beta carotene w/minerals (OCUVITE) tablet, Take 1 tablet by mouth daily., Disp: , Rfl:  .  carbamazepine (TEGRETOL) 200 MG tablet, Take 400 mg by mouth 2 (two) times daily. , Disp: , Rfl:  .  Cyanocobalamin (VITAMIN B 12 PO), Take by mouth daily.  , Disp: , Rfl:  .  esomeprazole (NEXIUM) 20 MG capsule, Take 20 mg  by mouth daily before breakfast., Disp: , Rfl:  .  levothyroxine (SYNTHROID, LEVOTHROID) 175 MCG tablet, Take 175 mcg by mouth daily.  , Disp: , Rfl:  .  lisinopril (PRINIVIL,ZESTRIL) 10 MG tablet, TAKE 1 TABLET BY MOUTH EVERY DAY, Disp: 90 tablet, Rfl: 0 .  Multiple Vitamin (MULTIVITAMIN) capsule, Take 1 capsule by mouth daily.  , Disp: , Rfl:  .  Omega-3 Fatty Acids (FISH OIL PO), Take by mouth daily.  , Disp: , Rfl:  .  pravastatin (PRAVACHOL) 20 MG tablet, TAKE 1 TABLET(20 MG) BY MOUTH DAILY, Disp: 90 tablet, Rfl: 0 .  sertraline (ZOLOFT) 100 MG tablet, TAKE 1 TABLET EVERY DAY, Disp: 90 tablet, Rfl: 3 .  fluticasone (FLONASE) 50 MCG/ACT nasal spray, Place 2 sprays into both nostrils daily. (Patient not taking: Reported on 12/12/2014), Disp: 16 g, Rfl: 6 .  senna-docusate (SENOKOT S) 8.6-50 MG per tablet, Take 1 tablet by mouth at bedtime as  needed. (Patient not taking: Reported on 12/12/2014), Disp: 30 tablet, Rfl: 1  EXAM:  Filed Vitals:   12/12/14 1048  BP: 130/82  Pulse: 89  Temp: 98.7 F (37.1 C)    Body mass index is 37.34 kg/(m^2).  GENERAL: vitals reviewed and listed above, alert, oriented, appears well hydrated and in no acute distress  HEENT: atraumatic, conjunttiva clear, no obvious abnormalities on inspection of external nose and ears, normal appearance of ear canals and TMs, clear nasal congestion, mild post oropharyngeal erythema with PND, no tonsillar edema or exudate, no sinus TTP  NECK: no obvious masses on inspection  LUNGS: clear to auscultation bilaterally, no wheezes, rales or rhonchi, good air movement, ? Rales R base  CV: HRRR, no peripheral edema  ABD: BS+, soft, NTTP  MS: moves all extremities without noticeable abnormality  PSYCH: pleasant and cooperative, no obvious depression or anxiety  ASSESSMENT AND PLAN:  Discussed the following assessment and plan:  Cough - Plan: DG Chest 2 View  Nausea without vomiting - Plan: CBC with Differential, Basic metabolic panel  Gastroesophageal reflux disease, esophagitis presence not specified  Allergic rhinitis, unspecified allergic rhinitis type  Hypothyroidism, unspecified hypothyroidism type - Plan: TSH  -suspect viral infection versus allergies and flare reflux, will get CXR to exclude PNA -increase nexium to 40 mg for 2 weeks only and add probiotic -follow up in 2 weeks -Patient advised to return or notify a doctor immediately if symptoms worsen or persist or new concerns arise.  Patient Instructions  BEFORE YOU LEAVE: -xray sheet -follow up in 2 weeks -labs  Go get chest xray  Nexium 40 mg dialy for 2 weeks  Align or culturelle probiotic and take daily for 2 weeks  Flonase daily per instructions       Mahogany Torrance R.

## 2014-12-15 ENCOUNTER — Other Ambulatory Visit: Payer: Self-pay | Admitting: Family Medicine

## 2014-12-15 MED ORDER — SERTRALINE HCL 50 MG PO TABS: 75.0000 mg | ORAL_TABLET | Freq: Every day | ORAL | Status: DC

## 2014-12-26 ENCOUNTER — Ambulatory Visit: Payer: Medicare HMO | Admitting: Family Medicine

## 2015-01-02 ENCOUNTER — Encounter: Payer: Self-pay | Admitting: Family Medicine

## 2015-01-02 ENCOUNTER — Ambulatory Visit (INDEPENDENT_AMBULATORY_CARE_PROVIDER_SITE_OTHER): Payer: Medicare HMO | Admitting: Family Medicine

## 2015-01-02 VITALS — BP 130/72 | HR 83 | Temp 98.1°F | Ht 65.0 in

## 2015-01-02 DIAGNOSIS — K5900 Constipation, unspecified: Secondary | ICD-10-CM | POA: Diagnosis not present

## 2015-01-02 DIAGNOSIS — M546 Pain in thoracic spine: Secondary | ICD-10-CM

## 2015-01-02 DIAGNOSIS — R110 Nausea: Secondary | ICD-10-CM

## 2015-01-02 DIAGNOSIS — F329 Major depressive disorder, single episode, unspecified: Secondary | ICD-10-CM

## 2015-01-02 DIAGNOSIS — F32A Depression, unspecified: Secondary | ICD-10-CM

## 2015-01-02 NOTE — Patient Instructions (Addendum)
BEFORE YOU LEAVE: -labs -follow up in 3 months -see if she wants the flu shot -xray sheet, upper back exercises  -We placed a referral for you as discussed to the gastroenterologist for the persistent nausea and constipation. It usually takes about 1-2 weeks to process and schedule this referral. If you have not heard from [place] regarding this appointment in 2 weeks please contact our office.  -Call the psychiatry office today to make an appointment with the psychiatrist and the counselor. Seek emergency care if thoughts of hurting yourself or severe depression.  -get xray of back

## 2015-01-02 NOTE — Progress Notes (Signed)
Pre visit review using our clinic review tool, if applicable. No additional management support is needed unless otherwise documented below in the visit note. 

## 2015-01-02 NOTE — Progress Notes (Signed)
HPI:  Nausea/Anorexia -now for > 6 weeks, cough initially - now resolved -persistent nausea, upset stomach, anorexia -recent CXR ok, nexium 2 week course, probiotic -denies: hematochezia, vomiting, melena, weight loss -does have chronic constipation not responding to stool softeners  Back pain: -throacic back pain -for several months, constant mild aching -denies: weakness, numbness, fevers  Depression: -worsening over last few months -long hx of MDD, reports has seen numerous psychiatrist and counselors without much help -chronically depressed mood, emotional, crying spells, anxiety, anehedonia -denies SI, thoughts of self harm -labs ok, zoloft reduced due to hyponatremia  ROS: See pertinent positives and negatives per HPI.  Past Medical History  Diagnosis Date  . ALLERGIC RHINITIS 05/27/2007    uses Flonase daily as needed and ALbuterol inhaler prn;gets Allergy shots  . Anxiety and depression 12/19/2009  . LOW BACK PAIN 05/27/2007  . Essential hypertension, benign 06/26/2009  . Hyperlipidemia     takes Pravastatin daily  . History of migraine many yrs ago  . Seizure disorder     reports remote, takes Tegretol daily, followed by neurology  . Numbness     left toes   . Arthritis   . History of gout   . GERD 05/27/2007    takes Nexium daily  . Constipation   . Diverticulosis   . Insomnia     but doesn't take any meds  . Heart murmur     yrs ago  . Environmental allergies   . Hypothyroidism   . Depression   . Seizures     last 85  . Cancer     vaginal    Past Surgical History  Procedure Laterality Date  . Heel spurs Bilateral   . Tonsillectomy    . Adenoidectomy    . Colonoscopy    . Esophagogastroduodenoscopy    . Knee arthroplasty Left 11/07/2013    Procedure: COMPUTER ASSISTED TOTAL KNEE ARTHROPLASTY;  Surgeon: Marybelle Killings, MD;  Location: [facility];  Service: Orthopedics;  Laterality: Left;  Left Total Knee Arthroplasty, Cemented, Computer Assist  . Back surgery       x2  . Total knee revision Left 09/19/2014    Procedure: LEFT TOTAL KNEE REVISION;  Surgeon: Leandrew Koyanagi, MD;  Location: [facility];  Service: Orthopedics;  Laterality: Left;    Family History  Problem Relation Age of Onset  . Arthritis Mother   . Heart murmur Other     Social History   Social History  . Marital Status: Married    Spouse Name: N/A  . Number of Children: N/A  . Years of Education: N/A   Social History Main Topics  . Smoking status: Former Smoker -- 0.25 packs/day for 20 years    Types: Cigarettes    Quit date: 09/08/1978  . Smokeless tobacco: Never Used     Comment: quit smokikng 3yrs ago  . Alcohol Use: No  . Drug Use: No  . Sexual Activity: No   Other Topics Concern  . None   Social History Narrative     Current outpatient prescriptions:  .  albuterol (PROVENTIL HFA;VENTOLIN HFA) 108 (90 BASE) MCG/ACT inhaler, Inhale 1 puff into the lungs every 6 (six) hours as needed for wheezing or shortness of breath., Disp: , Rfl:  .  aspirin 81 MG tablet, Take 81 mg by mouth daily., Disp: , Rfl:  .  beta carotene w/minerals (OCUVITE) tablet, Take 1 tablet by mouth daily., Disp: , Rfl:  .  carbamazepine (TEGRETOL) 200 MG tablet,  Take 400 mg by mouth 2 (two) times daily. , Disp: , Rfl:  .  Cyanocobalamin (VITAMIN B 12 PO), Take by mouth daily.  , Disp: , Rfl:  .  esomeprazole (NEXIUM) 20 MG capsule, Take 20 mg by mouth daily before breakfast., Disp: , Rfl:  .  fluticasone (FLONASE) 50 MCG/ACT nasal spray, Place 2 sprays into both nostrils daily., Disp: 16 g, Rfl: 6 .  levothyroxine (SYNTHROID, LEVOTHROID) 175 MCG tablet, Take 175 mcg by mouth daily.  , Disp: , Rfl:  .  lisinopril (PRINIVIL,ZESTRIL) 10 MG tablet, TAKE 1 TABLET BY MOUTH EVERY DAY, Disp: 90 tablet, Rfl: 0 .  Multiple Vitamin (MULTIVITAMIN) capsule, Take 1 capsule by mouth daily.  , Disp: , Rfl:  .  Omega-3 Fatty Acids (FISH OIL PO), Take by mouth daily.  , Disp: , Rfl:  .  pravastatin (PRAVACHOL) 20  MG tablet, TAKE 1 TABLET(20 MG) BY MOUTH DAILY, Disp: 90 tablet, Rfl: 0 .  senna-docusate (SENOKOT S) 8.6-50 MG per tablet, Take 1 tablet by mouth at bedtime as needed., Disp: 30 tablet, Rfl: 1 .  sertraline (ZOLOFT) 50 MG tablet, Take 1.5 tablets (75 mg total) by mouth daily., Disp: 45 tablet, Rfl: 3  EXAM:  Filed Vitals:   01/02/15 1526  BP: 130/72  Pulse: 83  Temp: 98.1 F (36.7 C)    There is no weight on file to calculate BMI.  GENERAL: vitals reviewed and listed above, alert, oriented, appears well hydrated and in no acute distress  HEENT: atraumatic, conjunttiva clear, no obvious abnormalities on inspection of external nose and ears  NECK: no obvious masses on inspection  LUNGS: clear to auscultation bilaterally, no wheezes, rales or rhonchi, good air movement  CV: HRRR, no peripheral edema  ABD: BS+, soft, NTTP  MS: moves all extremities without noticeable abnormality  PSYCH: depressed mood, tearful at times  ASSESSMENT AND PLAN:  Discussed the following assessment and plan:  Nausea without vomiting - Plan: CMP, Ambulatory referral to Gastroenterology -she is frustrated not resolved -stop PPI as not helping much, ranitidine if reflux OTC -GI referral, may need EGD, GES  Constipation, unspecified constipation type - Plan: Ambulatory referral to Gastroenterology  Depression: -worsening, persistent -had to decrease SSRI due to hypnatremia -advised psych eval and CBT, she agreed to call to schedule, though feels nothing will help -advise 911 and emergency care if thought s of harm to self or others or severe symptoms  Follow up in 3 months, sooner if needed  -Patient advised to return or notify a doctor immediately if symptoms worsen or persist or new concerns arise.  Patient Instructions  BEFORE YOU LEAVE: -labs -follow up in 3 months -see if she wants the flu shot  -We placed a referral for you as discussed to the gastroenterologist for the persistent  nausea and constipation. It usually takes about 1-2 weeks to process and schedule this referral. If you have not heard from [place] regarding this appointment in 2 weeks please contact our office.  -Call the psychiatry office today to make an appointment with the psychiatrist and the counselor. Seek emergency care if thoughts of hurting yourself or severe depression.     Colin Benton R.

## 2015-01-03 ENCOUNTER — Encounter: Payer: Self-pay | Admitting: Gastroenterology

## 2015-01-03 ENCOUNTER — Telehealth: Payer: Self-pay | Admitting: Family Medicine

## 2015-01-03 ENCOUNTER — Ambulatory Visit (INDEPENDENT_AMBULATORY_CARE_PROVIDER_SITE_OTHER)
Admission: RE | Admit: 2015-01-03 | Discharge: 2015-01-03 | Disposition: A | Discharge status: Home / Self Care | Payer: Medicare HMO | Source: Ambulatory Visit | Attending: Family Medicine | Admitting: Family Medicine

## 2015-01-03 DIAGNOSIS — M546 Pain in thoracic spine: Secondary | ICD-10-CM | POA: Diagnosis not present

## 2015-01-03 LAB — COMPREHENSIVE METABOLIC PANEL
ALBUMIN: 4.1 g/dL (ref 3.5–5.2)
ALK PHOS: 101 U/L (ref 39–117)
ALT: 17 U/L (ref 0–35)
AST: 21 U/L (ref 0–37)
BILIRUBIN TOTAL: 0.2 mg/dL (ref 0.2–1.2)
BUN: 11 mg/dL (ref 6–23)
CO2: 29 mEq/L (ref 19–32)
CREATININE: 0.65 mg/dL (ref 0.40–1.20)
Calcium: 9.2 mg/dL (ref 8.4–10.5)
Chloride: 95 mEq/L — ABNORMAL LOW (ref 96–112)
GFR: 96.66 mL/min (ref >60.00)
GLUCOSE: 104 mg/dL — AB (ref 70–99)
POTASSIUM: 4.2 meq/L (ref 3.5–5.1)
SODIUM: 132 meq/L — AB (ref 135–145)
TOTAL PROTEIN: 7.8 g/dL (ref 6.0–8.3)

## 2015-01-03 NOTE — Telephone Encounter (Signed)
Please provide her the number for [facility] and notify her of the [facility]. I was not aware Dr. Arvil Persons office did not take Medicare and apologize, but I believe these other options do. The xray of the back looked good.

## 2015-01-03 NOTE — Telephone Encounter (Signed)
FYI Pt is calling to let dr kim know dr Lita Mains  does not accept medicare or medicaid.

## 2015-01-04 ENCOUNTER — Other Ambulatory Visit: Payer: Self-pay | Admitting: Family Medicine

## 2015-01-04 NOTE — Telephone Encounter (Signed)
I called the pt and gave her the phone numbers for [facility] and [facility].  She was informed of the x-ray results as well.

## 2015-01-08 ENCOUNTER — Ambulatory Visit (INDEPENDENT_AMBULATORY_CARE_PROVIDER_SITE_OTHER): Payer: Medicare HMO | Admitting: *Deleted

## 2015-01-08 DIAGNOSIS — Z23 Encounter for immunization: Secondary | ICD-10-CM

## 2015-01-10 ENCOUNTER — Ambulatory Visit: Payer: Medicare HMO | Admitting: Family Medicine

## 2015-02-15 DIAGNOSIS — E871 Hypo-osmolality and hyponatremia: Secondary | ICD-10-CM | POA: Diagnosis not present

## 2015-02-16 DIAGNOSIS — J301 Allergic rhinitis due to pollen: Secondary | ICD-10-CM | POA: Diagnosis not present

## 2015-02-16 DIAGNOSIS — J3081 Allergic rhinitis due to animal (cat) (dog) hair and dander: Secondary | ICD-10-CM | POA: Diagnosis not present

## 2015-02-16 DIAGNOSIS — J3089 Other allergic rhinitis: Secondary | ICD-10-CM | POA: Diagnosis not present

## 2015-02-20 DIAGNOSIS — R05 Cough: Secondary | ICD-10-CM | POA: Diagnosis not present

## 2015-02-20 DIAGNOSIS — J3089 Other allergic rhinitis: Secondary | ICD-10-CM | POA: Diagnosis not present

## 2015-02-20 DIAGNOSIS — J3081 Allergic rhinitis due to animal (cat) (dog) hair and dander: Secondary | ICD-10-CM | POA: Diagnosis not present

## 2015-02-20 DIAGNOSIS — J301 Allergic rhinitis due to pollen: Secondary | ICD-10-CM | POA: Diagnosis not present

## 2015-02-20 DIAGNOSIS — H1045 Other chronic allergic conjunctivitis: Secondary | ICD-10-CM | POA: Diagnosis not present

## 2015-02-21 DIAGNOSIS — E890 Postprocedural hypothyroidism: Secondary | ICD-10-CM | POA: Diagnosis not present

## 2015-02-23 ENCOUNTER — Ambulatory Visit: Payer: Medicare HMO | Admitting: Gastroenterology

## 2015-02-23 DIAGNOSIS — G40209 Localization-related (focal) (partial) symptomatic epilepsy and epileptic syndromes with complex partial seizures, not intractable, without status epilepticus: Secondary | ICD-10-CM | POA: Diagnosis not present

## 2015-02-23 DIAGNOSIS — E871 Hypo-osmolality and hyponatremia: Secondary | ICD-10-CM | POA: Diagnosis not present

## 2015-02-28 DIAGNOSIS — J3081 Allergic rhinitis due to animal (cat) (dog) hair and dander: Secondary | ICD-10-CM | POA: Diagnosis not present

## 2015-02-28 DIAGNOSIS — J301 Allergic rhinitis due to pollen: Secondary | ICD-10-CM | POA: Diagnosis not present

## 2015-02-28 DIAGNOSIS — I10 Essential (primary) hypertension: Secondary | ICD-10-CM | POA: Diagnosis not present

## 2015-02-28 DIAGNOSIS — E032 Hypothyroidism due to medicaments and other exogenous substances: Secondary | ICD-10-CM | POA: Diagnosis not present

## 2015-02-28 DIAGNOSIS — J3089 Other allergic rhinitis: Secondary | ICD-10-CM | POA: Diagnosis not present

## 2015-03-06 ENCOUNTER — Encounter: Payer: Self-pay | Admitting: Gastroenterology

## 2015-03-06 ENCOUNTER — Ambulatory Visit (INDEPENDENT_AMBULATORY_CARE_PROVIDER_SITE_OTHER): Payer: Medicare HMO | Admitting: Gastroenterology

## 2015-03-06 VITALS — BP 130/76 | HR 64 | Ht 65.0 in | Wt 225.2 lb

## 2015-03-06 DIAGNOSIS — E871 Hypo-osmolality and hyponatremia: Secondary | ICD-10-CM

## 2015-03-06 DIAGNOSIS — R1310 Dysphagia, unspecified: Secondary | ICD-10-CM

## 2015-03-06 DIAGNOSIS — R110 Nausea: Secondary | ICD-10-CM

## 2015-03-06 DIAGNOSIS — K5900 Constipation, unspecified: Secondary | ICD-10-CM

## 2015-03-06 MED ORDER — POLYETHYLENE GLYCOL 3350 17 GM/SCOOP PO POWD: ORAL | Status: DC

## 2015-03-06 NOTE — Patient Instructions (Signed)
We have sent the following medications to your pharmacy for you to pick up at your convenience: Miralax  You have been scheduled for an endoscopy. Please follow written instructions given to you at your visit today. If you use inhalers (even only as needed), please bring them with you on the day of your procedure. Your physician has requested that you go to www.startemmi.com and enter the access code given to you at your visit today. This web site gives a general overview about your procedure. However, you should still follow specific instructions given to you by our office regarding your preparation for the procedure.

## 2015-03-06 NOTE — Progress Notes (Signed)
HPI :  70 y/o female seen in consultation for nausea, dysphagia, and constipation from Dr. Colin Benton.  Patient reports she has had some nausea bothering her for several months, around at least a year or so. She reports she has had some low sodium levels recently and has had some changes in her seizure medications and unsure if related. She was on tegretol longstanding, and has since changed over to lamotrigine, currently in transition. She won't be off tegretol for another 4 weeks or so, currently tapering. She has not noticed a change in nausea thus far but has not yet decreased her tegretol. Nausea is intermittent and sporadic. She normally does not vomit but has had some episodes of vomiting with her nausea. She has not changed her diet much. The nausea comes and goes, is intermittent. The nausea is daily. Nausea does not seem related to eating for the most part. Vomiting does not seem postprandial for the most part, has happened rarely.   She has some ongoing dysphagia with solids and liquids otherwise. Has been ongoing for several months. No odynophagia. She also has a sense of globus as well. No weight loss.   She has had a knee surgery but otherwise no new medications. No chronic narcotics. She denies tylenol PRN for pain, no NSAIDs. She takes nexium 20mg  daily. She has not tried anything else for her nausea thus far.   No prior upper endoscopy.   She otherwise has chronic longstanding constipation for years, and bothering her currently. She has up to one BM per week when severe, usually every 4 days or so. She has taken some senna which helps. She has not tried Miralax recently. She denies blood in the stools.   Colonoscopy 2009 was normal.    Past Medical History  Diagnosis Date  . ALLERGIC RHINITIS 05/27/2007    uses Flonase daily as needed and ALbuterol inhaler prn;gets Allergy shots  . Anxiety and depression 12/19/2009  . LOW BACK PAIN 05/27/2007  . Essential hypertension, benign  06/26/2009  . Hyperlipidemia     takes Pravastatin daily  . History of migraine many yrs ago  . Seizure disorder [facility])     reports remote, takes Tegretol daily, followed by neurology  . Numbness     left toes   . Arthritis   . History of gout   . GERD 05/27/2007    takes Nexium daily  . Constipation   . Diverticulosis   . Insomnia     but doesn't take any meds  . Heart murmur     yrs ago  . Environmental allergies   . Hypothyroidism   . Depression   . Seizures ([place])     last 85  . Cancer [facility])     vaginal     Past Surgical History  Procedure Laterality Date  . Heel spurs Bilateral   . Tonsillectomy    . Adenoidectomy    . Colonoscopy    . Esophagogastroduodenoscopy    . Knee arthroplasty Left 11/07/2013    Procedure: COMPUTER ASSISTED TOTAL KNEE ARTHROPLASTY;  Surgeon: Marybelle Killings, MD;  Location: [facility];  Service: Orthopedics;  Laterality: Left;  Left Total Knee Arthroplasty, Cemented, Computer Assist  . Back surgery      x2  . Total knee revision Left 09/19/2014    Procedure: LEFT TOTAL KNEE REVISION;  Surgeon: Leandrew Koyanagi, MD;  Location: [facility];  Service: Orthopedics;  Laterality: Left;   Family History  Problem Relation Age  of Onset  . Arthritis Mother   . Heart murmur Other    Social History  Substance Use Topics  . Smoking status: Former Smoker -- 0.25 packs/day for 20 years    Types: Cigarettes    Quit date: 09/08/1978  . Smokeless tobacco: Never Used     Comment: quit smokikng 21yrs ago  . Alcohol Use: No   Current Outpatient Prescriptions  Medication Sig Dispense Refill  . albuterol (PROVENTIL HFA;VENTOLIN HFA) 108 (90 BASE) MCG/ACT inhaler Inhale 1 puff into the lungs every 6 (six) hours as needed for wheezing or shortness of breath.    Marland Kitchen aspirin 81 MG tablet Take 81 mg by mouth daily.    . beta carotene w/minerals (OCUVITE) tablet Take 1 tablet by mouth daily.    . carbamazepine (TEGRETOL) 200 MG tablet Take 400 mg by mouth 2 (two) times daily.     .  Cyanocobalamin (VITAMIN B 12 PO) Take by mouth daily.      Marland Kitchen esomeprazole (NEXIUM) 20 MG capsule Take 20 mg by mouth daily before breakfast.    . fluticasone (FLONASE) 50 MCG/ACT nasal spray Place 2 sprays into both nostrils daily. 16 g 6  . lamoTRIgine (LAMICTAL) 25 MG tablet TK 3 TS PO BID  2  . levothyroxine (SYNTHROID, LEVOTHROID) 175 MCG tablet Take 175 mcg by mouth daily.      Marland Kitchen lisinopril (PRINIVIL,ZESTRIL) 10 MG tablet TAKE 1 TABLET BY MOUTH EVERY DAY 90 tablet 0  . Multiple Vitamin (MULTIVITAMIN) capsule Take 1 capsule by mouth daily.      . Omega-3 Fatty Acids (FISH OIL PO) Take by mouth daily.      . pravastatin (PRAVACHOL) 20 MG tablet TAKE 1 TABLET(20 MG) BY MOUTH DAILY 90 tablet 0  . senna-docusate (SENOKOT S) 8.6-50 MG per tablet Take 1 tablet by mouth at bedtime as needed. 30 tablet 1  . sertraline (ZOLOFT) 50 MG tablet Take 1.5 tablets (75 mg total) by mouth daily. 45 tablet 3   No current facility-administered medications for this visit.   Allergies  Allergen Reactions  . Adhesive [Tape]     ?  Blister after knee surgery  . Clarithromycin   . Doxycycline   . Nickel   . Other     Metal and perfumes     Review of Systems: All systems reviewed and negative except where noted in HPI.   Lab Results  Component Value Date   WBC 5.1 12/12/2014   HGB 13.8 12/12/2014   HCT 40.7 12/12/2014   MCV 93.8 12/12/2014   PLT 245.0 12/12/2014    Lab Results  Component Value Date   CREATININE 0.65 01/02/2015   BUN 11 01/02/2015   NA 132* 01/02/2015   K 4.2 01/02/2015   CL 95* 01/02/2015   CO2 29 01/02/2015    Lab Results  Component Value Date   ALT 17 01/02/2015   AST 21 01/02/2015   ALKPHOS 101 01/02/2015   BILITOT 0.2 01/02/2015     Physical Exam: BP 130/76 mmHg  Pulse 64  Ht 5\' 5"  (1.651 m)  Wt 225 lb 3.2 oz (102.15 kg)  BMI 37.48 kg/m2 Constitutional: Pleasant,well-developed, female in no acute distress. HEENT: Normocephalic and atraumatic.  Conjunctivae are normal. No scleral icterus. Neck supple.  Cardiovascular: Normal rate, regular rhythm.  Pulmonary/chest: Effort normal and breath sounds normal. No wheezing, rales or rhonchi. Abdominal: Soft, nondistended, mild LLQ TTP without rebound or guarding. Bowel sounds active throughout. There are no masses palpable. No  hepatomegaly. Extremities: no edema Lymphadenopathy: No cervical adenopathy noted. Neurological: Alert and oriented to person place and time. Skin: Skin is warm and dry. No rashes noted. Psychiatric: Normal mood and affect. Behavior is normal.   ASSESSMENT AND PLAN: 69 y/o female with history of seizure disorder on longterm tegretol who has developed nausea over the past several months. She also has developed hyponatremia with variable Na levels, lowest in high 120s, thought perhaps related to SIADH from tegretol. It is possible her nausea could be related to the tegretol and hyponatremia, and will see how she responds to tapering off tegretol and normalization of her Na. Otherwise, she has dysphagia / globus but no other upper tract symptoms and nausea appears unrelated to her meals. I offered her an EGD to evaluate her dysphagia, and will ensure normal stomach / duodenum in regards to her nausea. I offered her some zofran in the interim to see if this helps her nausea but she declined.   Otherwise, she has chronic constipation and doesn't take much for it at present time. Recommend Miralax and discussed how to titrate this to effect. We will see how she responds to therapy, follow up if symptoms persist regarding this issue. Colonoscopy next due for recall in 2019 for screening purposes.   The indications, risks, and benefits of EGD were explained to the patient in detail. Risks include but are not limited to bleeding, perforation, adverse reaction to medications, and cardiopulmonary compromise. Sequelae include but are not limited to the possibility of surgery,  hospitalization, and mortality. The patient verbalized understanding and wished to proceed. All questions answered, referred to scheduler. Further recommendations pending results of the exam.   [place] Cellar, MD [facility] Pager [phone]   CC: Dr. Colin Benton.

## 2015-03-07 ENCOUNTER — Other Ambulatory Visit: Payer: Self-pay

## 2015-03-07 DIAGNOSIS — R110 Nausea: Secondary | ICD-10-CM

## 2015-03-07 DIAGNOSIS — R1310 Dysphagia, unspecified: Secondary | ICD-10-CM

## 2015-03-07 DIAGNOSIS — K5900 Constipation, unspecified: Secondary | ICD-10-CM

## 2015-03-08 DIAGNOSIS — Z124 Encounter for screening for malignant neoplasm of cervix: Secondary | ICD-10-CM | POA: Diagnosis not present

## 2015-03-08 DIAGNOSIS — Z1231 Encounter for screening mammogram for malignant neoplasm of breast: Secondary | ICD-10-CM | POA: Diagnosis not present

## 2015-03-13 DIAGNOSIS — M25562 Pain in left knee: Secondary | ICD-10-CM | POA: Diagnosis not present

## 2015-03-13 DIAGNOSIS — J3081 Allergic rhinitis due to animal (cat) (dog) hair and dander: Secondary | ICD-10-CM | POA: Diagnosis not present

## 2015-03-13 DIAGNOSIS — J301 Allergic rhinitis due to pollen: Secondary | ICD-10-CM | POA: Diagnosis not present

## 2015-03-13 DIAGNOSIS — J3089 Other allergic rhinitis: Secondary | ICD-10-CM | POA: Diagnosis not present

## 2015-03-23 DIAGNOSIS — J3081 Allergic rhinitis due to animal (cat) (dog) hair and dander: Secondary | ICD-10-CM | POA: Diagnosis not present

## 2015-03-23 DIAGNOSIS — J3089 Other allergic rhinitis: Secondary | ICD-10-CM | POA: Diagnosis not present

## 2015-03-23 DIAGNOSIS — J301 Allergic rhinitis due to pollen: Secondary | ICD-10-CM | POA: Diagnosis not present

## 2015-03-30 ENCOUNTER — Other Ambulatory Visit: Payer: Self-pay | Admitting: Family Medicine

## 2015-04-02 ENCOUNTER — Other Ambulatory Visit: Payer: Self-pay | Admitting: Family Medicine

## 2015-04-03 ENCOUNTER — Encounter: Payer: Self-pay | Admitting: Family Medicine

## 2015-04-03 ENCOUNTER — Ambulatory Visit (INDEPENDENT_AMBULATORY_CARE_PROVIDER_SITE_OTHER): Payer: Medicare HMO | Admitting: Family Medicine

## 2015-04-03 VITALS — BP 124/84 | HR 82 | Temp 98.5°F | Ht 65.0 in | Wt 223.6 lb

## 2015-04-03 DIAGNOSIS — J301 Allergic rhinitis due to pollen: Secondary | ICD-10-CM | POA: Diagnosis not present

## 2015-04-03 DIAGNOSIS — E871 Hypo-osmolality and hyponatremia: Secondary | ICD-10-CM

## 2015-04-03 DIAGNOSIS — R05 Cough: Secondary | ICD-10-CM

## 2015-04-03 DIAGNOSIS — E669 Obesity, unspecified: Secondary | ICD-10-CM | POA: Diagnosis not present

## 2015-04-03 DIAGNOSIS — J3089 Other allergic rhinitis: Secondary | ICD-10-CM | POA: Diagnosis not present

## 2015-04-03 DIAGNOSIS — I10 Essential (primary) hypertension: Secondary | ICD-10-CM

## 2015-04-03 DIAGNOSIS — F3341 Major depressive disorder, recurrent, in partial remission: Secondary | ICD-10-CM

## 2015-04-03 DIAGNOSIS — E785 Hyperlipidemia, unspecified: Secondary | ICD-10-CM | POA: Diagnosis not present

## 2015-04-03 DIAGNOSIS — R059 Cough, unspecified: Secondary | ICD-10-CM

## 2015-04-03 NOTE — Progress Notes (Signed)
HPI:  Follow up  Hypothyroidism: -managed by Dr. Wilson Singer -meds: synthroid  HTN/HLD/Obesity: -meds: lisinopril 10mg , pravastatin, ASA, fish oil  Nausea/Anorexia/Constipation/GERD: -saw GI recently -intermittent cough for several months -no SOB, weight loss, fevers -having EGD  Depression: -reports ok now -long hx of MDD, reports has seen numerous psychiatrist and counselors without much help -chronically depressed mood, emotional, crying spells, anxiety, anehedonia -denies SI, thoughts of self harm -labs ok, zoloft reduced due to hyponatremia  Seizure disorder: -managed by neurology -switching from tegretol to lamotrigine - but reports she did not tolerate lamictal so back on tegratol despite her neurologists concerns this was contributing to hyponatremia -sees neuro in follow up soon - Dr. Sheppard Evens at [place]: See pertinent positives and negatives per HPI.  Past Medical History  Diagnosis Date  . ALLERGIC RHINITIS 05/27/2007    uses Flonase daily as needed and ALbuterol inhaler prn;gets Allergy shots  . Anxiety and depression 12/19/2009  . LOW BACK PAIN 05/27/2007  . Essential hypertension, benign 06/26/2009  . Hyperlipidemia     takes Pravastatin daily  . History of migraine many yrs ago  . Seizure disorder [facility])     reports remote, takes Tegretol daily, followed by neurology  . Numbness     left toes   . Arthritis   . History of gout   . GERD 05/27/2007    takes Nexium daily  . Constipation   . Diverticulosis   . Insomnia     but doesn't take any meds  . Heart murmur     yrs ago  . Environmental allergies   . Hypothyroidism   . Depression   . Seizures ([place])     last 85  . Cancer [facility])     vaginal    Past Surgical History  Procedure Laterality Date  . Heel spurs Bilateral   . Tonsillectomy    . Adenoidectomy    . Colonoscopy    . Esophagogastroduodenoscopy    . Knee arthroplasty Left 11/07/2013    Procedure: COMPUTER ASSISTED TOTAL KNEE  ARTHROPLASTY;  Surgeon: Marybelle Killings, MD;  Location: [place];  Service: Orthopedics;  Laterality: Left;  Left Total Knee Arthroplasty, Cemented, Computer Assist  . Back surgery      x2  . Total knee revision Left 09/19/2014    Procedure: LEFT TOTAL KNEE REVISION;  Surgeon: Leandrew Koyanagi, MD;  Location: [place];  Service: Orthopedics;  Laterality: Left;    Family History  Problem Relation Age of Onset  . Arthritis Mother   . Heart murmur Other     Social History   Social History  . Marital Status: Married    Spouse Name: N/A  . Number of Children: 1  . Years of Education: N/A   Occupational History  . Retired    Social History Main Topics  . Smoking status: Former Smoker -- 0.25 packs/day for 20 years    Types: Cigarettes    Quit date: 09/08/1978  . Smokeless tobacco: Never Used     Comment: quit smokikng 70yrs ago  . Alcohol Use: No  . Drug Use: No  . Sexual Activity: No   Other Topics Concern  . None   Social History Narrative     Current outpatient prescriptions:  .  albuterol (PROVENTIL HFA;VENTOLIN HFA) 108 (90 BASE) MCG/ACT inhaler, Inhale 1 puff into the lungs every 6 (six) hours as needed for wheezing or shortness of breath., Disp: , Rfl:  .  aspirin 81 MG tablet,  Take 81 mg by mouth daily., Disp: , Rfl:  .  beta carotene w/minerals (OCUVITE) tablet, Take 1 tablet by mouth daily., Disp: , Rfl:  .  carbamazepine (TEGRETOL) 200 MG tablet, Take 400 mg by mouth 2 (two) times daily. , Disp: , Rfl:  .  Cyanocobalamin (VITAMIN B 12 PO), Take by mouth daily.  , Disp: , Rfl:  .  esomeprazole (NEXIUM) 20 MG capsule, Take 20 mg by mouth daily before breakfast., Disp: , Rfl:  .  fluticasone (FLONASE) 50 MCG/ACT nasal spray, Place 2 sprays into both nostrils daily., Disp: 16 g, Rfl: 6 .  levothyroxine (SYNTHROID, LEVOTHROID) 175 MCG tablet, Take 175 mcg by mouth daily.  , Disp: , Rfl:  .  lisinopril (PRINIVIL,ZESTRIL) 10 MG tablet, TAKE 1 TABLET BY MOUTH EVERY DAY, Disp: 90  tablet, Rfl: 0 .  Multiple Vitamin (MULTIVITAMIN) capsule, Take 1 capsule by mouth daily.  , Disp: , Rfl:  .  Omega-3 Fatty Acids (FISH OIL PO), Take by mouth daily.  , Disp: , Rfl:  .  polyethylene glycol powder (GLYCOLAX/MIRALAX) powder, Take 17 grams daily., Disp: 255 g, Rfl: 3 .  pravastatin (PRAVACHOL) 20 MG tablet, TAKE 1 TABLET(20 MG) BY MOUTH DAILY, Disp: 90 tablet, Rfl: 0 .  senna-docusate (SENOKOT S) 8.6-50 MG per tablet, Take 1 tablet by mouth at bedtime as needed., Disp: 30 tablet, Rfl: 1 .  sertraline (ZOLOFT) 50 MG tablet, TAKE 1 AND 1/2 TABLETS BY MOUTH EVERY DAY, Disp: 135 tablet, Rfl: 1  EXAM:  Filed Vitals:   04/03/15 1009  BP: 124/84  Pulse: 82  Temp: 98.5 F (36.9 C)    Body mass index is 37.21 kg/(m^2).  GENERAL: vitals reviewed and listed above, alert, oriented, appears well hydrated and in no acute distress  HEENT: atraumatic, conjunttiva clear, no obvious abnormalities on inspection of external nose and ears, normal appearance of ear canals and TMs, clear nasal congestion, mild post oropharyngeal erythema with PND, no tonsillar edema or exudate, no sinus TTP  NECK: no obvious masses on inspection  LUNGS: clear to auscultation bilaterally, no wheezes, rales or rhonchi, good air movement  CV: HRRR, no peripheral edema  MS: moves all extremities without noticeable abnormality  PSYCH: pleasant and cooperative, no obvious depression or anxiety  ASSESSMENT AND PLAN:  Discussed the following assessment and plan:  Essential hypertension - Plan: Basic metabolic panel  Hyperlipemia - Plan: Lipid Panel -not fasting so she agreed to follow up for fasting labs  Obesity -lifestyle recs  Hyponatremia -discussed potential causes, likely related to medications, she reports her neurologist wants her off tegratol but she restarted - advised she call them to notify them and follow up as planned -advised assistant to request recent neuro notes   Recurrent major  depressive disorder, in partial remission ([place]) -lower dose of zoloft improved sodium levels  Cough -discussed common causes, likely PND acid related -tx per instructions and she was advised to call if persists in 2 weeks, he is having EGD with GI  -Patient advised to return or notify a doctor immediately if symptoms worsen or persist or new concerns arise.  There are no Patient Instructions on file for this visit.   Colin Benton R.

## 2015-04-03 NOTE — Progress Notes (Signed)
Pre visit review using our clinic review tool, if applicable. No additional management support is needed unless otherwise documented below in the visit note. 

## 2015-04-03 NOTE — Patient Instructions (Addendum)
BEFORE YOU LEAVE: -schedule fasting lab appointment in next 2 weeks -Beverly Ballard, permission and obtain records from her neurologist -follow up in 3 months  FOR cough: AFRIN nasal spray for 3 days then STOP Make sure taking flonase and you acid medication (nexium) daily for 2 weeks Call in 2 weeks if cough persists or sooner if worsening  We recommend the following healthy lifestyle measures: - eat a healthy whole foods diet consisting of regular small meals composed of vegetables, fruits, beans, nuts, seeds, healthy meats such as white chicken and fish and whole grains.  - avoid sweets, white starchy foods, fried foods, fast food, processed foods, sodas, red meet and other fattening foods.  - get a least 150-300 minutes of aerobic exercise per week.

## 2015-04-04 DIAGNOSIS — G40909 Epilepsy, unspecified, not intractable, without status epilepticus: Secondary | ICD-10-CM | POA: Diagnosis not present

## 2015-04-04 DIAGNOSIS — E039 Hypothyroidism, unspecified: Secondary | ICD-10-CM | POA: Diagnosis not present

## 2015-04-04 DIAGNOSIS — E785 Hyperlipidemia, unspecified: Secondary | ICD-10-CM | POA: Diagnosis not present

## 2015-04-04 DIAGNOSIS — R69 Illness, unspecified: Secondary | ICD-10-CM | POA: Diagnosis not present

## 2015-04-04 DIAGNOSIS — I10 Essential (primary) hypertension: Secondary | ICD-10-CM | POA: Diagnosis not present

## 2015-04-04 DIAGNOSIS — K219 Gastro-esophageal reflux disease without esophagitis: Secondary | ICD-10-CM | POA: Diagnosis not present

## 2015-04-10 DIAGNOSIS — J301 Allergic rhinitis due to pollen: Secondary | ICD-10-CM | POA: Diagnosis not present

## 2015-04-10 DIAGNOSIS — J3089 Other allergic rhinitis: Secondary | ICD-10-CM | POA: Diagnosis not present

## 2015-04-10 DIAGNOSIS — J3081 Allergic rhinitis due to animal (cat) (dog) hair and dander: Secondary | ICD-10-CM | POA: Diagnosis not present

## 2015-04-17 ENCOUNTER — Other Ambulatory Visit (INDEPENDENT_AMBULATORY_CARE_PROVIDER_SITE_OTHER): Payer: Medicare HMO

## 2015-04-17 DIAGNOSIS — E785 Hyperlipidemia, unspecified: Secondary | ICD-10-CM

## 2015-04-17 DIAGNOSIS — I10 Essential (primary) hypertension: Secondary | ICD-10-CM | POA: Diagnosis not present

## 2015-04-17 LAB — BASIC METABOLIC PANEL
BUN: 9 mg/dL (ref 6–23)
CHLORIDE: 95 meq/L — AB (ref 96–112)
CO2: 30 meq/L (ref 19–32)
Calcium: 9.5 mg/dL (ref 8.4–10.5)
Creatinine, Ser: 0.66 mg/dL (ref 0.40–1.20)
GFR: 94.89 mL/min (ref >60.00)
GLUCOSE: 103 mg/dL — AB (ref 70–99)
POTASSIUM: 5.2 meq/L — AB (ref 3.5–5.1)
Sodium: 132 mEq/L — ABNORMAL LOW (ref 135–145)

## 2015-04-17 LAB — LIPID PANEL
CHOLESTEROL: 200 mg/dL (ref 0–200)
HDL: 67.4 mg/dL (ref >39.00)
LDL Cholesterol: 113 mg/dL — ABNORMAL HIGH (ref 0–99)
NonHDL: 132.59
Total CHOL/HDL Ratio: 3
Triglycerides: 100 mg/dL (ref 0.0–149.0)
VLDL: 20 mg/dL (ref 0.0–40.0)

## 2015-04-24 DIAGNOSIS — J3089 Other allergic rhinitis: Secondary | ICD-10-CM | POA: Diagnosis not present

## 2015-04-24 DIAGNOSIS — J3081 Allergic rhinitis due to animal (cat) (dog) hair and dander: Secondary | ICD-10-CM | POA: Diagnosis not present

## 2015-04-24 DIAGNOSIS — J301 Allergic rhinitis due to pollen: Secondary | ICD-10-CM | POA: Diagnosis not present

## 2015-04-25 DIAGNOSIS — E871 Hypo-osmolality and hyponatremia: Secondary | ICD-10-CM | POA: Diagnosis not present

## 2015-04-25 DIAGNOSIS — G40209 Localization-related (focal) (partial) symptomatic epilepsy and epileptic syndromes with complex partial seizures, not intractable, without status epilepticus: Secondary | ICD-10-CM | POA: Diagnosis not present

## 2015-05-03 DIAGNOSIS — J301 Allergic rhinitis due to pollen: Secondary | ICD-10-CM | POA: Diagnosis not present

## 2015-05-03 DIAGNOSIS — J3089 Other allergic rhinitis: Secondary | ICD-10-CM | POA: Diagnosis not present

## 2015-05-03 DIAGNOSIS — J3081 Allergic rhinitis due to animal (cat) (dog) hair and dander: Secondary | ICD-10-CM | POA: Diagnosis not present

## 2015-05-07 ENCOUNTER — Encounter: Payer: Self-pay | Admitting: Gastroenterology

## 2015-05-07 ENCOUNTER — Ambulatory Visit (AMBULATORY_SURGERY_CENTER): Payer: Medicare HMO | Admitting: Gastroenterology

## 2015-05-07 VITALS — BP 154/76 | HR 52 | Temp 96.5°F | Resp 20 | Ht 65.0 in | Wt 225.0 lb

## 2015-05-07 DIAGNOSIS — K317 Polyp of stomach and duodenum: Secondary | ICD-10-CM | POA: Diagnosis not present

## 2015-05-07 DIAGNOSIS — R110 Nausea: Secondary | ICD-10-CM | POA: Diagnosis not present

## 2015-05-07 DIAGNOSIS — R1310 Dysphagia, unspecified: Secondary | ICD-10-CM | POA: Diagnosis not present

## 2015-05-07 DIAGNOSIS — R569 Unspecified convulsions: Secondary | ICD-10-CM | POA: Diagnosis not present

## 2015-05-07 DIAGNOSIS — K2950 Unspecified chronic gastritis without bleeding: Secondary | ICD-10-CM | POA: Diagnosis not present

## 2015-05-07 DIAGNOSIS — M1712 Unilateral primary osteoarthritis, left knee: Secondary | ICD-10-CM | POA: Diagnosis not present

## 2015-05-07 DIAGNOSIS — R69 Illness, unspecified: Secondary | ICD-10-CM | POA: Diagnosis not present

## 2015-05-07 DIAGNOSIS — I10 Essential (primary) hypertension: Secondary | ICD-10-CM | POA: Diagnosis not present

## 2015-05-07 DIAGNOSIS — E039 Hypothyroidism, unspecified: Secondary | ICD-10-CM | POA: Diagnosis not present

## 2015-05-07 DIAGNOSIS — K219 Gastro-esophageal reflux disease without esophagitis: Secondary | ICD-10-CM | POA: Diagnosis not present

## 2015-05-07 MED ORDER — SODIUM CHLORIDE 0.9 % IV SOLN: 500.0000 mL | INTRAVENOUS | Status: DC

## 2015-05-07 NOTE — Progress Notes (Signed)
Called to room to assist during endoscopic procedure.  Patient ID and intended procedure confirmed with present staff. Received instructions for my participation in the procedure from the performing physician.  

## 2015-05-07 NOTE — Patient Instructions (Signed)
Discharge instructions given. Biopsies taken. Resume previous medications. YOU HAD AN ENDOSCOPIC PROCEDURE TODAY AT THE [facility]:   Refer to the procedure report that was given to you for any specific questions about what was found during the examination.  If the procedure report does not answer your questions, please call your gastroenterologist to clarify.  If you requested that your care partner not be given the details of your procedure findings, then the procedure report has been included in a sealed envelope for you to review at your convenience later.  YOU SHOULD EXPECT: Some feelings of bloating in the abdomen. Passage of more gas than usual.  Walking can help get rid of the air that was put into your GI tract during the procedure and reduce the bloating. If you had a lower endoscopy (such as a colonoscopy or flexible sigmoidoscopy) you may notice spotting of blood in your stool or on the toilet paper. If you underwent a bowel prep for your procedure, you may not have a normal bowel movement for a few days.  Please Note:  You might notice some irritation and congestion in your nose or some drainage.  This is from the oxygen used during your procedure.  There is no need for concern and it should clear up in a day or so.  SYMPTOMS TO REPORT IMMEDIATELY:   Following upper endoscopy (EGD)  Vomiting of blood or coffee ground material  New chest pain or pain under the shoulder blades  Painful or persistently difficult swallowing  New shortness of breath  Fever of 100F or higher  Black, tarry-looking stools  For urgent or emergent issues, a gastroenterologist can be reached at any hour by calling [phone].   DIET: Your first meal following the procedure should be a small meal and then it is ok to progress to your normal diet. Heavy or fried foods are harder to digest and may make you feel nauseous or bloated.  Likewise, meals heavy in dairy and vegetables can increase  bloating.  Drink plenty of fluids but you should avoid alcoholic beverages for 24 hours.  ACTIVITY:  You should plan to take it easy for the rest of today and you should NOT DRIVE or use heavy machinery until tomorrow (because of the sedation medicines used during the test).    FOLLOW UP: Our staff will call the number listed on your records the next business day following your procedure to check on you and address any questions or concerns that you may have regarding the information given to you following your procedure. If we do not reach you, we will leave a message.  However, if you are feeling well and you are not experiencing any problems, there is no need to return our call.  We will assume that you have returned to your regular daily activities without incident.  If any biopsies were taken you will be contacted by phone or by letter within the next 1-3 weeks.  Please call us at [phone] if you have not heard about the biopsies in 3 weeks.    SIGNATURES/CONFIDENTIALITY: You and/or your care partner have signed paperwork which will be entered into your electronic medical record.  These signatures attest to the fact that that the information above on your After Visit Summary has been reviewed and is understood.  Full responsibility of the confidentiality of this discharge information lies with you and/or your care-partner. 

## 2015-05-07 NOTE — Progress Notes (Signed)
Report to PACU, RN, vss, BBS= Clear.  

## 2015-05-07 NOTE — Op Note (Signed)
[facility]  [address] [place], [address]   ENDOSCOPY PROCEDURE REPORT  PATIENT: Beverly, Ballard  MR#: [id number] BIRTHDATE: Nov 13, 1945 , 72  yrs. old GENDER: female ENDOSCOPIST: Yetta Flock, MD REFERRED BY: PROCEDURE DATE:  05/07/2015 PROCEDURE:  EGD w/ biopsy ASA CLASS:     Class II INDICATIONS:  dysphagia and nausea. MEDICATIONS: Propofol 150 mg IV TOPICAL ANESTHETIC:  DESCRIPTION OF PROCEDURE: After the risks benefits and alternatives of the procedure were thoroughly explained, informed consent was obtained.  The [id number] endoscope was introduced through the mouth and advanced to the second portion of the duodenum , Without limitations.  The instrument was slowly withdrawn as the mucosa was fully examined.   FINDINGS:The esophagus was normal in appearance without stenosis or stricture.  The GEJ, SCJ, and DH were noted 40cm from the incisors. Multiple sessile small (<50mm) gastric polyps were noted in the gastric body and fundus, grossly consistent with benign fundic gland polyps.  Two of them were removed as a representative sample. The remainder of the examined stomach was normal in appearance, biopsies taken to rule out H pylori.  The duodenal bulb and 2nd portion of the dudenum were normal in appearance.  Retroflexed views revealed no abnormalities.     The scope was then withdrawn from the patient and the procedure completed.  COMPLICATIONS: There were no immediate complications.  ENDOSCOPIC IMPRESSION: Normal esophagus Suspected benign, fundic gland polyps, representative sample obtained to confirm diagnosis Normal remainder of examined stomach, biopsies taken to rule out H pylori Normal duodenum  RECOMMENDATIONS: Resume diet Resume medications Await pathology results If dysphagia persists, recommend esophageal manometry to evaluate for esophageal dysmotility given the normal appearance of the esophagus on today's  exam  eSigned:  Yetta Flock, MD 05/07/2015 10:25 AM   CC: the patient

## 2015-05-08 ENCOUNTER — Telehealth: Payer: Self-pay | Admitting: *Deleted

## 2015-05-08 NOTE — Telephone Encounter (Signed)
Message left

## 2015-05-10 DIAGNOSIS — J3081 Allergic rhinitis due to animal (cat) (dog) hair and dander: Secondary | ICD-10-CM | POA: Diagnosis not present

## 2015-05-10 DIAGNOSIS — J301 Allergic rhinitis due to pollen: Secondary | ICD-10-CM | POA: Diagnosis not present

## 2015-05-10 DIAGNOSIS — J3089 Other allergic rhinitis: Secondary | ICD-10-CM | POA: Diagnosis not present

## 2015-05-14 DIAGNOSIS — G40209 Localization-related (focal) (partial) symptomatic epilepsy and epileptic syndromes with complex partial seizures, not intractable, without status epilepticus: Secondary | ICD-10-CM | POA: Diagnosis not present

## 2015-05-14 DIAGNOSIS — E871 Hypo-osmolality and hyponatremia: Secondary | ICD-10-CM | POA: Diagnosis not present

## 2015-05-18 DIAGNOSIS — J301 Allergic rhinitis due to pollen: Secondary | ICD-10-CM | POA: Diagnosis not present

## 2015-05-18 DIAGNOSIS — J3081 Allergic rhinitis due to animal (cat) (dog) hair and dander: Secondary | ICD-10-CM | POA: Diagnosis not present

## 2015-05-18 DIAGNOSIS — J3089 Other allergic rhinitis: Secondary | ICD-10-CM | POA: Diagnosis not present

## 2015-05-25 DIAGNOSIS — J3089 Other allergic rhinitis: Secondary | ICD-10-CM | POA: Diagnosis not present

## 2015-05-25 DIAGNOSIS — J301 Allergic rhinitis due to pollen: Secondary | ICD-10-CM | POA: Diagnosis not present

## 2015-05-25 DIAGNOSIS — J3081 Allergic rhinitis due to animal (cat) (dog) hair and dander: Secondary | ICD-10-CM | POA: Diagnosis not present

## 2015-05-28 DIAGNOSIS — J301 Allergic rhinitis due to pollen: Secondary | ICD-10-CM | POA: Diagnosis not present

## 2015-06-01 ENCOUNTER — Ambulatory Visit (INDEPENDENT_AMBULATORY_CARE_PROVIDER_SITE_OTHER): Payer: Medicare HMO | Admitting: Family Medicine

## 2015-06-01 ENCOUNTER — Encounter: Payer: Self-pay | Admitting: Family Medicine

## 2015-06-01 VITALS — BP 138/74 | HR 89 | Temp 97.5°F | Ht 65.0 in | Wt 220.4 lb

## 2015-06-01 DIAGNOSIS — J069 Acute upper respiratory infection, unspecified: Secondary | ICD-10-CM | POA: Diagnosis not present

## 2015-06-01 MED ORDER — BENZONATATE 100 MG PO CAPS: 100.0000 mg | ORAL_CAPSULE | Freq: Three times a day (TID) | ORAL | Status: DC | PRN

## 2015-06-01 NOTE — Progress Notes (Signed)
Pre visit review using our clinic review tool, if applicable. No additional management support is needed unless otherwise documented below in the visit note. 

## 2015-06-01 NOTE — Patient Instructions (Signed)

## 2015-06-01 NOTE — Progress Notes (Signed)
HPI:  URI: -started: about 5 days ago -symptoms:nasal congestion, sore throat, cough -denies:fever, SOB, NVD, tooth pain -has tried: humidifier -sick contacts/travel/risks: denies flu exposure, daughter, grandaughter and husband all with the same -Hx of: allergies  ROS: See pertinent positives and negatives per HPI.  Past Medical History  Diagnosis Date  . ALLERGIC RHINITIS 05/27/2007    uses Flonase daily as needed and ALbuterol inhaler prn;gets Allergy shots  . Anxiety and depression 12/19/2009  . LOW BACK PAIN 05/27/2007  . Essential hypertension, benign 06/26/2009  . Hyperlipidemia     takes Pravastatin daily  . History of migraine many yrs ago  . Seizure disorder [facility])     reports remote, takes Tegretol daily, followed by neurology  . Numbness     left toes   . Arthritis   . History of gout   . GERD 05/27/2007    takes Nexium daily  . Constipation   . Diverticulosis   . Insomnia     but doesn't take any meds  . Heart murmur     yrs ago  . Environmental allergies   . Hypothyroidism   . Depression   . Cancer ([place])     vaginal  . Allergy   . Osteoporosis   . Seizures ([place])     last 1983, none since then    Past Surgical History  Procedure Laterality Date  . Heel spurs Bilateral   . Tonsillectomy    . Adenoidectomy    . Colonoscopy    . Esophagogastroduodenoscopy    . Knee arthroplasty Left 11/07/2013    Procedure: COMPUTER ASSISTED TOTAL KNEE ARTHROPLASTY;  Surgeon: Marybelle Killings, MD;  Location: [place];  Service: Orthopedics;  Laterality: Left;  Left Total Knee Arthroplasty, Cemented, Computer Assist  . Back surgery      x2  . Total knee revision Left 09/19/2014    Procedure: LEFT TOTAL KNEE REVISION;  Surgeon: Leandrew Koyanagi, MD;  [facility];  Service: Orthopedics;  Laterality: Left;    Family History  Problem Relation Age of Onset  . Arthritis Mother   . Heart murmur Other   . Colon cancer Neg Hx   . Esophageal cancer Neg Hx   . Stomach cancer Neg Hx    . Rectal cancer Neg Hx     Social History   Social History  . Marital Status: Married    Spouse Name: N/A  . Number of Children: 1  . Years of Education: N/A   Occupational History  . Retired    Social History Main Topics  . Smoking status: Former Smoker -- 0.25 packs/day for 20 years    Types: Cigarettes    Quit date: 09/08/1978  . Smokeless tobacco: Never Used     Comment: quit smokikng 61yrs ago  . Alcohol Use: No  . Drug Use: No  . Sexual Activity: No   Other Topics Concern  . None   Social History Narrative     Current outpatient prescriptions:  .  albuterol (PROVENTIL HFA;VENTOLIN HFA) 108 (90 BASE) MCG/ACT inhaler, Inhale 1 puff into the lungs every 6 (six) hours as needed for wheezing or shortness of breath., Disp: , Rfl:  .  aspirin 81 MG tablet, Take 81 mg by mouth daily., Disp: , Rfl:  .  beta carotene w/minerals (OCUVITE) tablet, Take 1 tablet by mouth daily., Disp: , Rfl:  .  carbamazepine (TEGRETOL) 200 MG tablet, Take 400 mg by mouth 2 (two) times daily. , Disp: ,  Rfl:  .  Cyanocobalamin (VITAMIN B 12 PO), Take by mouth daily.  , Disp: , Rfl:  .  esomeprazole (NEXIUM) 20 MG capsule, Take 20 mg by mouth daily before breakfast., Disp: , Rfl:  .  fluticasone (FLONASE) 50 MCG/ACT nasal spray, Place 2 sprays into both nostrils daily., Disp: 16 g, Rfl: 6 .  levothyroxine (SYNTHROID, LEVOTHROID) 175 MCG tablet, Take 175 mcg by mouth daily.  , Disp: , Rfl:  .  lisinopril (PRINIVIL,ZESTRIL) 10 MG tablet, TAKE 1 TABLET BY MOUTH EVERY DAY, Disp: 90 tablet, Rfl: 0 .  Multiple Vitamin (MULTIVITAMIN) capsule, Take 1 capsule by mouth daily.  , Disp: , Rfl:  .  Omega-3 Fatty Acids (FISH OIL PO), Take by mouth daily.  , Disp: , Rfl:  .  polyethylene glycol powder (GLYCOLAX/MIRALAX) powder, Take 17 grams daily., Disp: 255 g, Rfl: 3 .  pravastatin (PRAVACHOL) 20 MG tablet, TAKE 1 TABLET(20 MG) BY MOUTH DAILY, Disp: 90 tablet, Rfl: 0 .  senna-docusate (SENOKOT S) 8.6-50 MG  per tablet, Take 1 tablet by mouth at bedtime as needed., Disp: 30 tablet, Rfl: 1 .  sertraline (ZOLOFT) 50 MG tablet, TAKE 1 AND 1/2 TABLETS BY MOUTH EVERY DAY, Disp: 135 tablet, Rfl: 1 .  zonisamide (ZONEGRAN) 50 MG capsule, TK ONE C PO HS FOR 1 WEEK THEN SLOWLY INCREASE TO UP TO 1 C Q 4 H, Disp: , Rfl: 1 .  benzonatate (TESSALON) 100 MG capsule, Take 1 capsule (100 mg total) by mouth 3 (three) times daily as needed for cough., Disp: 20 capsule, Rfl: 0  EXAM:  Filed Vitals:   06/01/15 1029  BP: 138/74  Pulse: 89  Temp: 97.5 F (36.4 C)    Body mass index is 36.68 kg/(m^2).  GENERAL: vitals reviewed and listed above, alert, oriented, appears well hydrated and in no acute distress  HEENT: atraumatic, conjunttiva clear, no obvious abnormalities on inspection of external nose and ears, normal appearance of ear canals and TMs, clear nasal congestion, mild post oropharyngeal erythema with PND, no tonsillar edema or exudate, no sinus TTP  NECK: no obvious masses on inspection  LUNGS: clear to auscultation bilaterally, no wheezes, rales or rhonchi, good air movement  CV: HRRR, no peripheral edema  MS: moves all extremities without noticeable abnormality  PSYCH: pleasant and cooperative, no obvious depression or anxiety  ASSESSMENT AND PLAN:  Discussed the following assessment and plan:  Acute upper respiratory infection  -given HPI and exam findings today, a serious infection or illness is unlikely. We discussed potential etiologies, with VURI being most likely, and advised supportive care and monitoring. We discussed treatment side effects, likely course, antibiotic misuse, transmission, and signs of developing a serious illness. -of course, we advised to return or notify a doctor immediately if symptoms worsen or persist or new concerns arise.    Patient Instructions  INSTRUCTIONS FOR UPPER RESPIRATORY INFECTION:  -plenty of rest and fluids  -nasal saline wash 2-3 times  daily (use prepackaged nasal saline or bottled/distilled water if making your own)   -can use AFRIN nasal spray for drainage and nasal congestion - but do NOT use longer then 3-4 days  -can use tylenol (in no history of liver disease) or ibuprofen (if no history of kidney disease, bowel bleeding or significant heart disease) as directed for aches and sorethroat  -in the winter time, using a humidifier at night is helpful (please follow cleaning instructions)  -if you are taking a cough medication - use only as directed,  may also try a teaspoon of honey to coat the throat and throat lozenges. If given a cough medication with codeine or hydrocodone or other narcotic please be advised that this contains a strong and  potentially addicting medication. Please follow instructions carefully, take as little as possible and only use AS NEEDED for severe cough. Discuss potential side effects with your pharmacy. Please do not drive or operate machinery while taking these types of medications. Please do not take other sedating medications, drugs or alcohol while taking this medication without discussing with your doctor.  -for sore throat, salt water gargles can help  -follow up if you have fevers, facial pain, tooth pain, difficulty breathing or are worsening or symptoms persist longer then expected  Upper Respiratory Infection, Adult An upper respiratory infection (URI) is also known as the common cold. It is often caused by a type of germ (virus). Colds are easily spread (contagious). You can pass it to others by kissing, coughing, sneezing, or drinking out of the same glass. Usually, you get better in 1 to 3  weeks.  However, the cough can last for even longer. HOME CARE   Only take medicine as told by your doctor. Follow instructions provided above.  Drink enough water and fluids to keep your pee (urine) clear or pale yellow.  Get plenty of rest.  Return to work when your temperature is < 100 for 24  hours or as told by your doctor. You may use a face mask and wash your hands to stop your cold from spreading. GET HELP RIGHT AWAY IF:   After the first few days, you feel you are getting worse.  You have questions about your medicine.  You have chills, shortness of breath, or red spit (mucus).  You have pain in the face for more then 1-2 days, especially when you bend forward.  You have a fever, puffy (swollen) neck, pain when you swallow, or white spots in the back of your throat.  You have a bad headache, ear pain, sinus pain, or chest pain.  You have a high-pitched whistling sound when you breathe in and out (wheezing).  You cough up blood.  You have sore muscles or a stiff neck. MAKE SURE YOU:   Understand these instructions.  Will watch your condition.  Will get help right away if you are not doing well or get worse. Document Released: 09/24/2007 Document Revised: 06/30/2011 Document Reviewed: 07/13/2013 [facility] Patient Information 2015 [place], [place]. This information is not intended to replace advice given to you by your health care provider. Make sure you discuss any questions you have with your health care provider.      Colin Benton R.

## 2015-06-07 DIAGNOSIS — J301 Allergic rhinitis due to pollen: Secondary | ICD-10-CM | POA: Diagnosis not present

## 2015-06-07 DIAGNOSIS — J3089 Other allergic rhinitis: Secondary | ICD-10-CM | POA: Diagnosis not present

## 2015-06-07 DIAGNOSIS — J3081 Allergic rhinitis due to animal (cat) (dog) hair and dander: Secondary | ICD-10-CM | POA: Diagnosis not present

## 2015-06-14 DIAGNOSIS — J3081 Allergic rhinitis due to animal (cat) (dog) hair and dander: Secondary | ICD-10-CM | POA: Diagnosis not present

## 2015-06-14 DIAGNOSIS — J3089 Other allergic rhinitis: Secondary | ICD-10-CM | POA: Diagnosis not present

## 2015-06-14 DIAGNOSIS — J301 Allergic rhinitis due to pollen: Secondary | ICD-10-CM | POA: Diagnosis not present

## 2015-06-27 ENCOUNTER — Ambulatory Visit (INDEPENDENT_AMBULATORY_CARE_PROVIDER_SITE_OTHER): Payer: Medicare HMO | Admitting: Family Medicine

## 2015-06-27 ENCOUNTER — Encounter: Payer: Self-pay | Admitting: Family Medicine

## 2015-06-27 VITALS — BP 138/80 | HR 90 | Temp 98.3°F | Ht 65.0 in | Wt 217.2 lb

## 2015-06-27 DIAGNOSIS — J40 Bronchitis, not specified as acute or chronic: Secondary | ICD-10-CM | POA: Diagnosis not present

## 2015-06-27 MED ORDER — PREDNISONE 20 MG PO TABS: 40.0000 mg | ORAL_TABLET | Freq: Every day | ORAL | Status: DC

## 2015-06-27 NOTE — Progress Notes (Signed)
Pre visit review using our clinic review tool, if applicable. No additional management support is needed unless otherwise documented below in the visit note. 

## 2015-06-27 NOTE — Progress Notes (Addendum)
HPI:  Beverly Ballard is a pleasant 70 year old with a past medical history significant for anxiety and depression, allergic rhinitis and GERD, here for an acute visit for an upper respiratory illness. She reports the symptoms started about 7-10 days ago. Initially had low-grade fevers, nausea, body aches, cough, nasal congestion, sore throat. Most symptoms have resolved but she has a persistent cough that is keeping her up at night. Does have some clear mucus production with this. She denies persistent fevers, shortness of breath, wheezing, sinus pain, ear pain or other symptoms. She has a history of allergies and tends to get a cough after illnesses that lasts for several weeks and that she tolerates poorly, and has used Flonase and albuterol in the past. No PFTs in the chart.   ROS: See pertinent positives and negatives per HPI.  Past Medical History  Diagnosis Date  . ALLERGIC RHINITIS 05/27/2007    uses Flonase daily as needed and ALbuterol inhaler prn;gets Allergy shots  . Anxiety and depression 12/19/2009  . LOW BACK PAIN 05/27/2007  . Essential hypertension, benign 06/26/2009  . Hyperlipidemia     takes Pravastatin daily  . History of migraine many yrs ago  . Seizure disorder [facility])     reports remote, takes Tegretol daily, followed by neurology  . Numbness     left toes   . Arthritis   . History of gout   . GERD 05/27/2007    takes Nexium daily  . Constipation   . Diverticulosis   . Insomnia     but doesn't take any meds  . Heart murmur     yrs ago  . Environmental allergies   . Hypothyroidism   . Depression   . Cancer ([place])     vaginal  . Allergy   . Osteoporosis   . Seizures ([place])     last 1983, none since then    Past Surgical History  Procedure Laterality Date  . Heel spurs Bilateral   . Tonsillectomy    . Adenoidectomy    . Colonoscopy    . Esophagogastroduodenoscopy    . Knee arthroplasty Left 11/07/2013    Procedure: COMPUTER ASSISTED TOTAL KNEE ARTHROPLASTY;   Surgeon: Marybelle Killings, MD;  Location: [place];  Service: Orthopedics;  Laterality: Left;  Left Total Knee Arthroplasty, Cemented, Computer Assist  . Back surgery      x2  . Total knee revision Left 09/19/2014    Procedure: LEFT TOTAL KNEE REVISION;  Surgeon: Leandrew Koyanagi, MD;  Location: [place];  Service: Orthopedics;  Laterality: Left;    Family History  Problem Relation Age of Onset  . Arthritis Mother   . Heart murmur Other   . Colon cancer Neg Hx   . Esophageal cancer Neg Hx   . Stomach cancer Neg Hx   . Rectal cancer Neg Hx     Social History   Social History  . Marital Status: Married    Spouse Name: N/A  . Number of Children: 1  . Years of Education: N/A   Occupational History  . Retired    Social History Main Topics  . Smoking status: Former Smoker -- 0.25 packs/day for 20 years    Types: Cigarettes    Quit date: 09/08/1978  . Smokeless tobacco: Never Used     Comment: quit smokikng 17yrs ago  . Alcohol Use: No  . Drug Use: No  . Sexual Activity: No   Other Topics Concern  . None  Social History Narrative     Current outpatient prescriptions:  .  albuterol (PROVENTIL HFA;VENTOLIN HFA) 108 (90 BASE) MCG/ACT inhaler, Inhale 1 puff into the lungs every 6 (six) hours as needed for wheezing or shortness of breath., Disp: , Rfl:  .  aspirin 81 MG tablet, Take 81 mg by mouth daily., Disp: , Rfl:  .  benzonatate (TESSALON) 100 MG capsule, Take 1 capsule (100 mg total) by mouth 3 (three) times daily as needed for cough., Disp: 20 capsule, Rfl: 0 .  beta carotene w/minerals (OCUVITE) tablet, Take 1 tablet by mouth daily., Disp: , Rfl:  .  carbamazepine (TEGRETOL) 200 MG tablet, Take 400 mg by mouth 2 (two) times daily. , Disp: , Rfl:  .  Cyanocobalamin (VITAMIN B 12 PO), Take by mouth daily.  , Disp: , Rfl:  .  esomeprazole (NEXIUM) 20 MG capsule, Take 20 mg by mouth daily before breakfast., Disp: , Rfl:  .  fluticasone (FLONASE) 50 MCG/ACT nasal spray, Place 2 sprays  into both nostrils daily., Disp: 16 g, Rfl: 6 .  levothyroxine (SYNTHROID, LEVOTHROID) 175 MCG tablet, Take 175 mcg by mouth daily.  , Disp: , Rfl:  .  lisinopril (PRINIVIL,ZESTRIL) 10 MG tablet, TAKE 1 TABLET BY MOUTH EVERY DAY, Disp: 90 tablet, Rfl: 0 .  Multiple Vitamin (MULTIVITAMIN) capsule, Take 1 capsule by mouth daily.  , Disp: , Rfl:  .  Omega-3 Fatty Acids (FISH OIL PO), Take by mouth daily.  , Disp: , Rfl:  .  polyethylene glycol powder (GLYCOLAX/MIRALAX) powder, Take 17 grams daily., Disp: 255 g, Rfl: 3 .  pravastatin (PRAVACHOL) 20 MG tablet, TAKE 1 TABLET(20 MG) BY MOUTH DAILY, Disp: 90 tablet, Rfl: 0 .  senna-docusate (SENOKOT S) 8.6-50 MG per tablet, Take 1 tablet by mouth at bedtime as needed., Disp: 30 tablet, Rfl: 1 .  sertraline (ZOLOFT) 50 MG tablet, TAKE 1 AND 1/2 TABLETS BY MOUTH EVERY DAY, Disp: 135 tablet, Rfl: 1 .  zonisamide (ZONEGRAN) 50 MG capsule, TK ONE C PO HS FOR 1 WEEK THEN SLOWLY INCREASE TO UP TO 1 C Q 4 H, Disp: , Rfl: 1 .  predniSONE (DELTASONE) 20 MG tablet, Take 2 tablets (40 mg total) by mouth daily with breakfast., Disp: 8 tablet, Rfl: 0  EXAM:  Filed Vitals:   06/27/15 1027  BP: 138/80  Pulse: 90  Temp: 98.3 F (36.8 C)    Body mass index is 36.14 kg/(m^2).  GENERAL: vitals reviewed and listed above, alert, oriented, appears well hydrated and in no acute distress  HEENT: atraumatic, conjunttiva clear, no obvious abnormalities on inspection of external nose and ears, normal appearance of ear canals and TMs, clear nasal congestion, mild post oropharyngeal erythema with PND, no tonsillar edema or exudate, no sinus TTP  NECK: no obvious masses on inspection  LUNGS: clear to auscultation bilaterally, no wheezes, rales or rhonchi, good air movement  CV: HRRR, no peripheral edema  MS: moves all extremities without noticeable abnormality  PSYCH: pleasant and cooperative, no obvious depression or anxiety  ASSESSMENT AND PLAN:  Discussed the  following assessment and plan:  Bronchitis  -Sounds like she had a viral upper respiratory or mild influenza-like illness. Given most of her symptoms are resolving except for the cough and she has no fevers, thick sputum or concerning findings on exam,  doubt a bacterial infection and has developed. Suspect postviral cough. She opted for treatment with a short course of prednisone after discussion of risks. She has Tessalon and  albuterol to use as needed. She sees an allergist for her allergies but I don't have a formal PFT in the chart. She may need to consider this after she is feeling better. Follow-up in 2 weeks. -Patient advised to return or notify a doctor immediately if symptoms worsen or persist or new concerns arise.  Patient Instructions  Before you leave: -Cancel the follow-up appointment that you have a Week. -Schedule a follow-up appointment and about 2 weeks.  Please start the prednisone and take 2 tablets daily for 4 days.  Use the Tessalon and the albuterol as needed.  Follow-up sooner if symptoms are worsening, he developed fevers or shortness of breath, he developed thick mucus when your coughing or if you have other concerns.     Colin Benton R.

## 2015-06-27 NOTE — Patient Instructions (Addendum)
Before you leave: -Cancel the follow-up appointment that you have a Week. -Schedule a follow-up appointment and about 2 weeks.  Please start the prednisone and take 2 tablets daily for 4 days.  Use the Tessalon and the albuterol as needed.  Follow-up sooner if symptoms are worsening, he developed fevers or shortness of breath, he developed thick mucus when your coughing or if you have other concerns.

## 2015-07-02 ENCOUNTER — Telehealth: Payer: Self-pay | Admitting: Family Medicine

## 2015-07-02 ENCOUNTER — Ambulatory Visit (INDEPENDENT_AMBULATORY_CARE_PROVIDER_SITE_OTHER)
Admission: RE | Admit: 2015-07-02 | Discharge: 2015-07-02 | Disposition: A | Discharge status: Home / Self Care | Payer: Medicare HMO | Source: Ambulatory Visit | Attending: Family Medicine | Admitting: Family Medicine

## 2015-07-02 ENCOUNTER — Ambulatory Visit: Payer: Medicare HMO | Admitting: Family Medicine

## 2015-07-02 DIAGNOSIS — R05 Cough: Secondary | ICD-10-CM | POA: Diagnosis not present

## 2015-07-02 DIAGNOSIS — R0602 Shortness of breath: Secondary | ICD-10-CM

## 2015-07-02 DIAGNOSIS — R0989 Other specified symptoms and signs involving the circulatory and respiratory systems: Secondary | ICD-10-CM | POA: Diagnosis not present

## 2015-07-02 NOTE — Telephone Encounter (Signed)
I called the pt and informed her of the message below and she stated she feels about the same.  She is aware the order was entered for the x-ray and stated she will go over now to have this done.

## 2015-07-02 NOTE — Telephone Encounter (Signed)
Pt saw Dr Maudie Mercury last wed. Pt states she was to let Dr Maudie Mercury know if not any better, pt is not. Has chest congestion, some SOB, and states Dr Maudie Mercury was to perhaps have her get a chest xray if not better. Pt would like to know if she should do that, or come back in?

## 2015-07-02 NOTE — Telephone Encounter (Signed)
If feeling worse, needs appt.  If about the same, ok to order CXR then will advised.

## 2015-07-03 DIAGNOSIS — G43009 Migraine without aura, not intractable, without status migrainosus: Secondary | ICD-10-CM | POA: Diagnosis not present

## 2015-07-03 DIAGNOSIS — G40209 Localization-related (focal) (partial) symptomatic epilepsy and epileptic syndromes with complex partial seizures, not intractable, without status epilepticus: Secondary | ICD-10-CM | POA: Diagnosis not present

## 2015-07-03 DIAGNOSIS — E871 Hypo-osmolality and hyponatremia: Secondary | ICD-10-CM | POA: Diagnosis not present

## 2015-07-06 DIAGNOSIS — J3089 Other allergic rhinitis: Secondary | ICD-10-CM | POA: Diagnosis not present

## 2015-07-06 DIAGNOSIS — J301 Allergic rhinitis due to pollen: Secondary | ICD-10-CM | POA: Diagnosis not present

## 2015-07-06 DIAGNOSIS — J3081 Allergic rhinitis due to animal (cat) (dog) hair and dander: Secondary | ICD-10-CM | POA: Diagnosis not present

## 2015-07-10 DIAGNOSIS — J3089 Other allergic rhinitis: Secondary | ICD-10-CM | POA: Diagnosis not present

## 2015-07-10 DIAGNOSIS — J301 Allergic rhinitis due to pollen: Secondary | ICD-10-CM | POA: Diagnosis not present

## 2015-07-10 DIAGNOSIS — J3081 Allergic rhinitis due to animal (cat) (dog) hair and dander: Secondary | ICD-10-CM | POA: Diagnosis not present

## 2015-07-12 DIAGNOSIS — J3089 Other allergic rhinitis: Secondary | ICD-10-CM | POA: Diagnosis not present

## 2015-07-12 DIAGNOSIS — J3081 Allergic rhinitis due to animal (cat) (dog) hair and dander: Secondary | ICD-10-CM | POA: Diagnosis not present

## 2015-07-14 ENCOUNTER — Other Ambulatory Visit: Payer: Self-pay | Admitting: Family Medicine

## 2015-07-19 ENCOUNTER — Ambulatory Visit (INDEPENDENT_AMBULATORY_CARE_PROVIDER_SITE_OTHER): Payer: Medicare HMO | Admitting: Family Medicine

## 2015-07-19 ENCOUNTER — Encounter: Payer: Self-pay | Admitting: Family Medicine

## 2015-07-19 VITALS — BP 130/70 | HR 68 | Temp 98.4°F | Ht 65.0 in | Wt 218.5 lb

## 2015-07-19 DIAGNOSIS — E039 Hypothyroidism, unspecified: Secondary | ICD-10-CM

## 2015-07-19 DIAGNOSIS — E669 Obesity, unspecified: Secondary | ICD-10-CM

## 2015-07-19 DIAGNOSIS — E785 Hyperlipidemia, unspecified: Secondary | ICD-10-CM | POA: Diagnosis not present

## 2015-07-19 DIAGNOSIS — I10 Essential (primary) hypertension: Secondary | ICD-10-CM

## 2015-07-19 DIAGNOSIS — F3341 Major depressive disorder, recurrent, in partial remission: Secondary | ICD-10-CM

## 2015-07-19 DIAGNOSIS — J3089 Other allergic rhinitis: Secondary | ICD-10-CM | POA: Diagnosis not present

## 2015-07-19 DIAGNOSIS — J3081 Allergic rhinitis due to animal (cat) (dog) hair and dander: Secondary | ICD-10-CM | POA: Diagnosis not present

## 2015-07-19 DIAGNOSIS — G40909 Epilepsy, unspecified, not intractable, without status epilepticus: Secondary | ICD-10-CM | POA: Diagnosis not present

## 2015-07-19 DIAGNOSIS — K219 Gastro-esophageal reflux disease without esophagitis: Secondary | ICD-10-CM

## 2015-07-19 DIAGNOSIS — J309 Allergic rhinitis, unspecified: Secondary | ICD-10-CM

## 2015-07-19 DIAGNOSIS — J301 Allergic rhinitis due to pollen: Secondary | ICD-10-CM | POA: Diagnosis not present

## 2015-07-19 NOTE — Progress Notes (Signed)
Pre visit review using our clinic review tool, if applicable. No additional management support is needed unless otherwise documented below in the visit note. 

## 2015-07-19 NOTE — Progress Notes (Signed)
HPI:  Follow up:  Post viral cough: -treated with prednisone 2 weeks ago -reports this is much much better, and is almost completely resolved except for her baseline allergy symptoms -sees asthma allergy specialist for allergies, may need PFTs -CXR ok  Hypothyroidism: -managed by Dr. Wilson Singer -meds: synthroid  HTN/HLD/Obesity: -meds: lisinopril 10mg , pravastatin, ASA, fish oil  Nausea/Anorexia/Constipation/GERD: -seeing GI for this -no SOB, weight loss, fevers -had EGD  Depression: -reports using since lowering of her Zoloft dose due to hypo-neutropenia -long hx of MDD, reports has seen numerous psychiatrist and counselors without much help -chronically depressed mood, emotional, crying spells, anxiety, anehedonia -denies SI, thoughts of self harm  Seizure disorder: -managed by neurology, Dr. Sheppard Evens at [facility] -neurology advised change from tegretol to lamotrigine given hyponatremia she initially was afraid to do this -Now reports she saw her neurologist very recently, she still is anxious and scared to make the transition her seizure medications and has many questions about this transition   ROS: See pertinent positives and negatives per HPI.  Past Medical History  Diagnosis Date  . ALLERGIC RHINITIS 05/27/2007    uses Flonase daily as needed and ALbuterol inhaler prn;gets Allergy shots  . Anxiety and depression 12/19/2009  . LOW BACK PAIN 05/27/2007  . Essential hypertension, benign 06/26/2009  . Hyperlipidemia     takes Pravastatin daily  . History of migraine many yrs ago  . Seizure disorder [facility])     reports remote, takes Tegretol daily, followed by neurology  . Numbness     left toes   . Arthritis   . History of gout   . GERD 05/27/2007    takes Nexium daily  . Constipation   . Diverticulosis   . Insomnia     but doesn't take any meds  . Heart murmur     yrs ago  . Environmental allergies   . Hypothyroidism   . Depression   . Cancer ([place])     vaginal  .  Allergy   . Osteoporosis   . Seizures ([place])     last 1983, none since then    Past Surgical History  Procedure Laterality Date  . Heel spurs Bilateral   . Tonsillectomy    . Adenoidectomy    . Colonoscopy    . Esophagogastroduodenoscopy    . Knee arthroplasty Left 11/07/2013    Procedure: COMPUTER ASSISTED TOTAL KNEE ARTHROPLASTY;  Surgeon: Marybelle Killings, MD;  Location: [place];  Service: Orthopedics;  Laterality: Left;  Left Total Knee Arthroplasty, Cemented, Computer Assist  . Back surgery      x2  . Total knee revision Left 09/19/2014    Procedure: LEFT TOTAL KNEE REVISION;  Surgeon: Leandrew Koyanagi, MD;  Location: [place];  Service: Orthopedics;  Laterality: Left;    Family History  Problem Relation Age of Onset  . Arthritis Mother   . Heart murmur Other   . Colon cancer Neg Hx   . Esophageal cancer Neg Hx   . Stomach cancer Neg Hx   . Rectal cancer Neg Hx     Social History   Social History  . Marital Status: Married    Spouse Name: N/A  . Number of Children: 1  . Years of Education: N/A   Occupational History  . Retired    Social History Main Topics  . Smoking status: Former Smoker -- 0.25 packs/day for 20 years    Types: Cigarettes    Quit date: 09/08/1978  . Smokeless tobacco: Never  Used     Comment: quit smokikng 27yrs ago  . Alcohol Use: No  . Drug Use: No  . Sexual Activity: No   Other Topics Concern  . None   Social History Narrative     Current outpatient prescriptions:  .  albuterol (PROVENTIL HFA;VENTOLIN HFA) 108 (90 BASE) MCG/ACT inhaler, Inhale 1 puff into the lungs every 6 (six) hours as needed for wheezing or shortness of breath., Disp: , Rfl:  .  aspirin 81 MG tablet, Take 81 mg by mouth daily., Disp: , Rfl:  .  beta carotene w/minerals (OCUVITE) tablet, Take 1 tablet by mouth daily., Disp: , Rfl:  .  carbamazepine (TEGRETOL) 200 MG tablet, Take 400 mg by mouth 2 (two) times daily. , Disp: , Rfl:  .  Cyanocobalamin (VITAMIN B 12 PO), Take by  mouth daily.  , Disp: , Rfl:  .  esomeprazole (NEXIUM) 20 MG capsule, Take 20 mg by mouth daily before breakfast., Disp: , Rfl:  .  fluticasone (FLONASE) 50 MCG/ACT nasal spray, Place 2 sprays into both nostrils daily., Disp: 16 g, Rfl: 6 .  levothyroxine (SYNTHROID, LEVOTHROID) 175 MCG tablet, Take 175 mcg by mouth daily.  , Disp: , Rfl:  .  lisinopril (PRINIVIL,ZESTRIL) 10 MG tablet, TAKE 1 TABLET BY MOUTH EVERY DAY, Disp: 90 tablet, Rfl: 0 .  Multiple Vitamin (MULTIVITAMIN) capsule, Take 1 capsule by mouth daily.  , Disp: , Rfl:  .  Omega-3 Fatty Acids (FISH OIL PO), Take by mouth daily.  , Disp: , Rfl:  .  polyethylene glycol powder (GLYCOLAX/MIRALAX) powder, Take 17 grams daily., Disp: 255 g, Rfl: 3 .  pravastatin (PRAVACHOL) 20 MG tablet, TAKE 1 TABLET(20 MG) BY MOUTH DAILY, Disp: 90 tablet, Rfl: 0 .  senna-docusate (SENOKOT S) 8.6-50 MG per tablet, Take 1 tablet by mouth at bedtime as needed., Disp: 30 tablet, Rfl: 1 .  sertraline (ZOLOFT) 50 MG tablet, TAKE 1 AND 1/2 TABLETS BY MOUTH EVERY DAY, Disp: 135 tablet, Rfl: 1 .  zonisamide (ZONEGRAN) 50 MG capsule, takes 100mg  every morning and 200mg  every evening, Disp: , Rfl: 1  EXAM:  Filed Vitals:   07/19/15 1050  BP: 130/70  Pulse: 68  Temp: 98.4 F (36.9 C)    Body mass index is 36.36 kg/(m^2).  GENERAL: vitals reviewed and listed above, alert, oriented, appears well hydrated and in no acute distress  HEENT: atraumatic, conjunttiva clear, no obvious abnormalities on inspection of external nose and ears  NECK: no obvious masses on inspection  LUNGS: clear to auscultation bilaterally, no wheezes, rales or rhonchi, good air movement  CV: HRRR, no peripheral edema  MS: moves all extremities without noticeable abnormality  PSYCH: pleasant and cooperative, chronically depressed mood  ASSESSMENT AND PLAN:  Discussed the following assessment and plan:  Recurrent major depressive disorder, in partial remission  ([place]) -Discussed treatment options, she really would like to increase her Zoloft back to a higher dose, however she has suffered from hyponatremia due to combination of this medication with her Tegretol. She currently is working with her neurologist to come off of the Tegretol - she had many questions and significant anxiety about this transition and seizure medications, spent a long time trying to assist her and reassure her and did advise that she contact her neurologist with any further concerns  -Advised CBT with Dr. Glennon Hamilton and she will consider  -No severe symptoms, no thoughts of self-harm or suicide  -Advised prompt emergency care if any of these should  occur  -Consider increasing Zoloft once off Tegretol with hyponatremia resolves, plan to check labs at next visit   Seizure disorder ([place]) -See above, managed by her neurologist   Obesity, BMI unknown -Lifestyle recommendations   Hyperlipemia Essential hypertension -Continue current treatment, plan labs at next visit for annual wellness exam   Hypothyroidism, unspecified hypothyroidism type - managed by her endocrinologist   Allergic rhinitis, unspecified allergic rhinitis type -thankful her cough is resolved, sees allergist   Gastroesophageal reflux disease, esophagitis presence not specified -Managed by gastroenterology   -Patient advised to return or notify a doctor immediately if symptoms worsen or persist or new concerns arise.  Patient Instructions  Before you leave: -Schedule Medicare annual wellness visit in 3 months; please come fasting and we will plan to do lab work that day-however please drink plenty of water and take your regular medications  Please call the number provided today to schedule an appointment with Dr. Glennon Hamilton.  Please contact your neurologist if you have any questions regarding your seizure medications.  We recommend the following healthy lifestyle measures: - eat a healthy whole foods diet  consisting of regular small meals composed of vegetables, fruits, beans, nuts, seeds, healthy meats such as white chicken and fish and whole grains.  - avoid sweets, white starchy foods, fried foods, fast food, processed foods, sodas, red meet and other fattening foods.  - get a least 150-300 minutes of aerobic exercise per week.   Hang in there.      Colin Benton R.

## 2015-07-19 NOTE — Patient Instructions (Signed)
Before you leave: -Schedule Medicare annual wellness visit in 3 months; please come fasting and we will plan to do lab work that day-however please drink plenty of water and take your regular medications  Please call the number provided today to schedule an appointment with Dr. Glennon Hamilton.  Please contact your neurologist if you have any questions regarding your seizure medications.  We recommend the following healthy lifestyle measures: - eat a healthy whole foods diet consisting of regular small meals composed of vegetables, fruits, beans, nuts, seeds, healthy meats such as white chicken and fish and whole grains.  - avoid sweets, white starchy foods, fried foods, fast food, processed foods, sodas, red meet and other fattening foods.  - get a least 150-300 minutes of aerobic exercise per week.   Hang in there.

## 2015-07-26 DIAGNOSIS — J3081 Allergic rhinitis due to animal (cat) (dog) hair and dander: Secondary | ICD-10-CM | POA: Diagnosis not present

## 2015-07-26 DIAGNOSIS — J301 Allergic rhinitis due to pollen: Secondary | ICD-10-CM | POA: Diagnosis not present

## 2015-07-26 DIAGNOSIS — J3089 Other allergic rhinitis: Secondary | ICD-10-CM | POA: Diagnosis not present

## 2015-08-02 ENCOUNTER — Ambulatory Visit (INDEPENDENT_AMBULATORY_CARE_PROVIDER_SITE_OTHER): Payer: Medicare HMO | Admitting: Psychology

## 2015-08-02 DIAGNOSIS — F331 Major depressive disorder, recurrent, moderate: Secondary | ICD-10-CM

## 2015-08-02 DIAGNOSIS — J301 Allergic rhinitis due to pollen: Secondary | ICD-10-CM | POA: Diagnosis not present

## 2015-08-02 DIAGNOSIS — J3089 Other allergic rhinitis: Secondary | ICD-10-CM | POA: Diagnosis not present

## 2015-08-02 DIAGNOSIS — J3081 Allergic rhinitis due to animal (cat) (dog) hair and dander: Secondary | ICD-10-CM | POA: Diagnosis not present

## 2015-08-07 ENCOUNTER — Other Ambulatory Visit: Payer: Self-pay | Admitting: Family Medicine

## 2015-08-07 DIAGNOSIS — J301 Allergic rhinitis due to pollen: Secondary | ICD-10-CM | POA: Diagnosis not present

## 2015-08-07 DIAGNOSIS — J3089 Other allergic rhinitis: Secondary | ICD-10-CM | POA: Diagnosis not present

## 2015-08-07 DIAGNOSIS — J3081 Allergic rhinitis due to animal (cat) (dog) hair and dander: Secondary | ICD-10-CM | POA: Diagnosis not present

## 2015-08-13 DIAGNOSIS — J301 Allergic rhinitis due to pollen: Secondary | ICD-10-CM | POA: Diagnosis not present

## 2015-08-13 DIAGNOSIS — J3081 Allergic rhinitis due to animal (cat) (dog) hair and dander: Secondary | ICD-10-CM | POA: Diagnosis not present

## 2015-08-13 DIAGNOSIS — J3089 Other allergic rhinitis: Secondary | ICD-10-CM | POA: Diagnosis not present

## 2015-08-15 ENCOUNTER — Ambulatory Visit (INDEPENDENT_AMBULATORY_CARE_PROVIDER_SITE_OTHER): Payer: Medicare HMO | Admitting: Psychology

## 2015-08-15 DIAGNOSIS — F411 Generalized anxiety disorder: Secondary | ICD-10-CM

## 2015-08-15 DIAGNOSIS — J3081 Allergic rhinitis due to animal (cat) (dog) hair and dander: Secondary | ICD-10-CM | POA: Diagnosis not present

## 2015-08-15 DIAGNOSIS — F331 Major depressive disorder, recurrent, moderate: Secondary | ICD-10-CM | POA: Diagnosis not present

## 2015-08-15 DIAGNOSIS — J3089 Other allergic rhinitis: Secondary | ICD-10-CM | POA: Diagnosis not present

## 2015-08-15 DIAGNOSIS — J301 Allergic rhinitis due to pollen: Secondary | ICD-10-CM | POA: Diagnosis not present

## 2015-08-23 DIAGNOSIS — J3089 Other allergic rhinitis: Secondary | ICD-10-CM | POA: Diagnosis not present

## 2015-08-23 DIAGNOSIS — J301 Allergic rhinitis due to pollen: Secondary | ICD-10-CM | POA: Diagnosis not present

## 2015-08-23 DIAGNOSIS — J3081 Allergic rhinitis due to animal (cat) (dog) hair and dander: Secondary | ICD-10-CM | POA: Diagnosis not present

## 2015-08-31 ENCOUNTER — Ambulatory Visit (INDEPENDENT_AMBULATORY_CARE_PROVIDER_SITE_OTHER): Payer: Medicare HMO | Admitting: Psychology

## 2015-08-31 DIAGNOSIS — F331 Major depressive disorder, recurrent, moderate: Secondary | ICD-10-CM

## 2015-08-31 DIAGNOSIS — F411 Generalized anxiety disorder: Secondary | ICD-10-CM

## 2015-08-31 DIAGNOSIS — J3081 Allergic rhinitis due to animal (cat) (dog) hair and dander: Secondary | ICD-10-CM | POA: Diagnosis not present

## 2015-08-31 DIAGNOSIS — J301 Allergic rhinitis due to pollen: Secondary | ICD-10-CM | POA: Diagnosis not present

## 2015-08-31 DIAGNOSIS — J3089 Other allergic rhinitis: Secondary | ICD-10-CM | POA: Diagnosis not present

## 2015-09-06 DIAGNOSIS — J301 Allergic rhinitis due to pollen: Secondary | ICD-10-CM | POA: Diagnosis not present

## 2015-09-06 DIAGNOSIS — J3089 Other allergic rhinitis: Secondary | ICD-10-CM | POA: Diagnosis not present

## 2015-09-10 DIAGNOSIS — G40209 Localization-related (focal) (partial) symptomatic epilepsy and epileptic syndromes with complex partial seizures, not intractable, without status epilepticus: Secondary | ICD-10-CM | POA: Diagnosis not present

## 2015-09-10 DIAGNOSIS — E871 Hypo-osmolality and hyponatremia: Secondary | ICD-10-CM | POA: Diagnosis not present

## 2015-09-13 DIAGNOSIS — J301 Allergic rhinitis due to pollen: Secondary | ICD-10-CM | POA: Diagnosis not present

## 2015-09-13 DIAGNOSIS — J3081 Allergic rhinitis due to animal (cat) (dog) hair and dander: Secondary | ICD-10-CM | POA: Diagnosis not present

## 2015-09-13 DIAGNOSIS — M25562 Pain in left knee: Secondary | ICD-10-CM | POA: Diagnosis not present

## 2015-09-13 DIAGNOSIS — J3089 Other allergic rhinitis: Secondary | ICD-10-CM | POA: Diagnosis not present

## 2015-09-18 ENCOUNTER — Telehealth: Payer: Self-pay | Admitting: Family Medicine

## 2015-09-18 ENCOUNTER — Ambulatory Visit (INDEPENDENT_AMBULATORY_CARE_PROVIDER_SITE_OTHER): Payer: Medicare HMO | Admitting: Psychology

## 2015-09-18 DIAGNOSIS — J3081 Allergic rhinitis due to animal (cat) (dog) hair and dander: Secondary | ICD-10-CM | POA: Diagnosis not present

## 2015-09-18 DIAGNOSIS — F331 Major depressive disorder, recurrent, moderate: Secondary | ICD-10-CM

## 2015-09-18 DIAGNOSIS — J3089 Other allergic rhinitis: Secondary | ICD-10-CM | POA: Diagnosis not present

## 2015-09-18 DIAGNOSIS — F411 Generalized anxiety disorder: Secondary | ICD-10-CM | POA: Diagnosis not present

## 2015-09-18 DIAGNOSIS — E870 Hyperosmolality and hypernatremia: Secondary | ICD-10-CM

## 2015-09-18 DIAGNOSIS — J301 Allergic rhinitis due to pollen: Secondary | ICD-10-CM | POA: Diagnosis not present

## 2015-09-18 NOTE — Telephone Encounter (Signed)
The patient seen her doctor from Dr. Doy Hutching, and that doctor suggested to increase her sertraline (ZOLOFT) 50 MG tablet to 100 MG since she is taking carbamazepine (TEGRETOL) 200 MG tablet. That doctor said that it wouldn't be too much sodium. She needs a refill on her  sertraline (ZOLOFT) 50 MG tablet to 100 MG send to:  [id number] - [place], [place] - [address] [place] [address] [place] [address] [phone] (Phone) [phone] (Fax)

## 2015-09-19 NOTE — Telephone Encounter (Signed)
Both tegretol and zoloft cause hyponatremia and this combination. Her neurologist advised changing the tegretol to something else in the past for this reason. I do not recommend a higher dose of zoloft. Please obtain records from the doctor whom suggested this. If feels zoloft is not working, also would have her follow up. Thank you.

## 2015-09-19 NOTE — Telephone Encounter (Signed)
Spoke with patient. Recommended that patient could come in to discuss her zoloft and bring her records. Patient stated that she had already spoken with Dr. Doy Hutching who increased her Zoloft to 100mg  QD. She stated Dr. Doy Hutching has decreased her tegratol to 200mg  for the next month and then she is to discontinue the tegratol.Her understanding from Dr. Doy Hutching is that the tegratol is causing her increased sodium levels, not the Zoloft. Patient to call with further concerns or questions.

## 2015-09-20 NOTE — Telephone Encounter (Signed)
Ok. If they are decreasing the tegretol ok to do zoloft 100mg  daily and only send one month with 2 refills. Please change rx and advise check BMP in 1 month to ensure sodium ok (lab appt). Thanks.

## 2015-09-21 MED ORDER — SERTRALINE HCL 100 MG PO TABS: 100.0000 mg | ORAL_TABLET | Freq: Every day | ORAL | Status: DC

## 2015-09-21 NOTE — Telephone Encounter (Signed)
Spoke to pt's husband [name] on [place], told him to let pt know Dr.Kim changed her Rx for Zoloft and I sent Rx to the pharmacy. Also pt needs to make lab appt only in one month to recheck sodium. [name] verbalized understanding and will let pt know.

## 2015-09-21 NOTE — Addendum Note (Signed)
Addended by: Marian Sorrow on: 09/21/2015 11:20 AM   Modules accepted: Orders

## 2015-09-21 NOTE — Telephone Encounter (Signed)
Left message on voicemail to call office.  

## 2015-09-30 ENCOUNTER — Other Ambulatory Visit: Payer: Self-pay | Admitting: Family Medicine

## 2015-10-01 ENCOUNTER — Other Ambulatory Visit: Payer: Self-pay | Admitting: *Deleted

## 2015-10-01 MED ORDER — SERTRALINE HCL 100 MG PO TABS: 100.0000 mg | ORAL_TABLET | Freq: Every day | ORAL | Status: DC

## 2015-10-01 NOTE — Telephone Encounter (Signed)
Rx done. 

## 2015-10-05 ENCOUNTER — Ambulatory Visit (INDEPENDENT_AMBULATORY_CARE_PROVIDER_SITE_OTHER): Payer: Medicare HMO | Admitting: Psychology

## 2015-10-05 ENCOUNTER — Ambulatory Visit (INDEPENDENT_AMBULATORY_CARE_PROVIDER_SITE_OTHER): Payer: Medicare HMO | Admitting: Family Medicine

## 2015-10-05 ENCOUNTER — Encounter: Payer: Self-pay | Admitting: Family Medicine

## 2015-10-05 ENCOUNTER — Other Ambulatory Visit: Payer: Self-pay | Admitting: Family Medicine

## 2015-10-05 VITALS — BP 120/72 | HR 71 | Temp 98.1°F | Ht 65.0 in | Wt 210.8 lb

## 2015-10-05 DIAGNOSIS — F331 Major depressive disorder, recurrent, moderate: Secondary | ICD-10-CM | POA: Diagnosis not present

## 2015-10-05 DIAGNOSIS — F411 Generalized anxiety disorder: Secondary | ICD-10-CM | POA: Diagnosis not present

## 2015-10-05 DIAGNOSIS — J3081 Allergic rhinitis due to animal (cat) (dog) hair and dander: Secondary | ICD-10-CM | POA: Diagnosis not present

## 2015-10-05 DIAGNOSIS — J3089 Other allergic rhinitis: Secondary | ICD-10-CM | POA: Diagnosis not present

## 2015-10-05 DIAGNOSIS — R69 Illness, unspecified: Secondary | ICD-10-CM | POA: Diagnosis not present

## 2015-10-05 DIAGNOSIS — F3342 Major depressive disorder, recurrent, in full remission: Secondary | ICD-10-CM

## 2015-10-05 DIAGNOSIS — R110 Nausea: Secondary | ICD-10-CM | POA: Diagnosis not present

## 2015-10-05 DIAGNOSIS — G40909 Epilepsy, unspecified, not intractable, without status epilepticus: Secondary | ICD-10-CM | POA: Diagnosis not present

## 2015-10-05 DIAGNOSIS — J301 Allergic rhinitis due to pollen: Secondary | ICD-10-CM | POA: Diagnosis not present

## 2015-10-05 LAB — LIPASE: Lipase: 16 U/L (ref 11.0–59.0)

## 2015-10-05 LAB — COMPLETE METABOLIC PANEL WITH GFR
ALT: 13 U/L (ref 6–29)
AST: 19 U/L (ref 10–35)
Albumin: 4.4 g/dL (ref 3.6–5.1)
Alkaline Phosphatase: 78 U/L (ref 33–130)
BILIRUBIN TOTAL: 0.3 mg/dL (ref 0.2–1.2)
BUN: 11 mg/dL (ref 7–25)
CO2: 26 mmol/L (ref 20–31)
Calcium: 9.7 mg/dL (ref 8.6–10.4)
Chloride: 104 mmol/L (ref 98–110)
Creat: 0.77 mg/dL (ref 0.50–0.99)
GFR, EST NON AFRICAN AMERICAN: 80 mL/min (ref >=60)
GFR, Est African American: >89 mL/min (ref >=60)
GLUCOSE: 91 mg/dL (ref 65–99)
POTASSIUM: 4.8 mmol/L (ref 3.5–5.3)
SODIUM: 138 mmol/L (ref 135–146)
TOTAL PROTEIN: 7.7 g/dL (ref 6.1–8.1)

## 2015-10-05 MED ORDER — SERTRALINE HCL 50 MG PO TABS: 75.0000 mg | ORAL_TABLET | Freq: Every day | ORAL | Status: DC

## 2015-10-05 NOTE — Patient Instructions (Addendum)
BEFORE YOU LEAVE: -Wendie Simmer, obtain recent notes from neurologist -labs -keep follow up as scheduled  We placed a referral for you as discussed for the ultrasound to look at your gallbladder. It usually takes about 1-2 weeks to process and schedule this referral. If you have not heard from [place] regarding this appointment in 2 weeks please contact our office.  Continue counseling with Dr. Glennon Hamilton.  Decrease zoloft to 75mg . I sent a new prescription. After labs and studies will will determine next step. Would advise seeing psychiatry if worsening depression.

## 2015-10-05 NOTE — Progress Notes (Signed)
Pre visit review using our clinic review tool, if applicable. No additional management support is needed unless otherwise documented below in the visit note. 

## 2015-10-05 NOTE — Progress Notes (Signed)
HPI:  Acute visit for:  Nausea: -symptoms: daily persistent constant anorexia and nausea since increasing zoloft; decreasing tegretol and started new medication with neurologist (zonegran) -thinks is her GB  -denies: change in bowels, vomiting, fevers, hematochezia, melena, reflux, abd pain -had eval with GI in the past, thought to be medication side effect - see below -tegretol for seizure d/o (sees neuro)/high dose ssri (for mdd, gad) combo thought to have caused low Na and nausea in the past; lowering ssri resolved issues -she recently made numerous calls to insist on increasing the zoloft to 100 and reported neurologist was decreasing her tegretol so that zoloft could be increased -she reports the neurologist told her she could increase the zoloft -she is seeing Dr. Glennon Hamilton now, feeling better in terms of depression the last week but still dealing with marital issues -still refuses psychiatry visit, no SI or thoughts of self harm   ROS: See pertinent positives and negatives per HPI.  Past Medical History  Diagnosis Date  . ALLERGIC RHINITIS 05/27/2007    uses Flonase daily as needed and ALbuterol inhaler prn;gets Allergy shots  . Anxiety and depression 12/19/2009  . LOW BACK PAIN 05/27/2007  . Essential hypertension, benign 06/26/2009  . Hyperlipidemia     takes Pravastatin daily  . History of migraine many yrs ago  . Seizure disorder [facility])     reports remote, takes Tegretol daily, followed by neurology  . Numbness     left toes   . Arthritis   . History of gout   . GERD 05/27/2007    takes Nexium daily  . Constipation   . Diverticulosis   . Insomnia     but doesn't take any meds  . Heart murmur     yrs ago  . Environmental allergies   . Hypothyroidism   . Depression   . Cancer ([place])     vaginal  . Allergy   . Osteoporosis   . Seizures ([place])     last 1983, none since then    Past Surgical History  Procedure Laterality Date  . Heel spurs Bilateral   . Tonsillectomy     . Adenoidectomy    . Colonoscopy    . Esophagogastroduodenoscopy    . Knee arthroplasty Left 11/07/2013    Procedure: COMPUTER ASSISTED TOTAL KNEE ARTHROPLASTY;  Surgeon: Marybelle Killings, MD;  Location: [place];  Service: Orthopedics;  Laterality: Left;  Left Total Knee Arthroplasty, Cemented, Computer Assist  . Back surgery      x2  . Total knee revision Left 09/19/2014    Procedure: LEFT TOTAL KNEE REVISION;  Surgeon: Leandrew Koyanagi, MD;  Location: [place];  Service: Orthopedics;  Laterality: Left;    Family History  Problem Relation Age of Onset  . Arthritis Mother   . Heart murmur Other   . Colon cancer Neg Hx   . Esophageal cancer Neg Hx   . Stomach cancer Neg Hx   . Rectal cancer Neg Hx     Social History   Social History  . Marital Status: Married    Spouse Name: N/A  . Number of Children: 1  . Years of Education: N/A   Occupational History  . Retired    Social History Main Topics  . Smoking status: Former Smoker -- 0.25 packs/day for 20 years    Types: Cigarettes    Quit date: 09/08/1978  . Smokeless tobacco: Never Used     Comment: quit smokikng 75yr ago  . Alcohol  Use: No  . Drug Use: No  . Sexual Activity: No   Other Topics Concern  . None   Social History Narrative     Current outpatient prescriptions:  .  albuterol (PROVENTIL HFA;VENTOLIN HFA) 108 (90 BASE) MCG/ACT inhaler, Inhale 1 puff into the lungs every 6 (six) hours as needed for wheezing or shortness of breath., Disp: , Rfl:  .  aspirin 81 MG tablet, Take 81 mg by mouth daily., Disp: , Rfl:  .  beta carotene w/minerals (OCUVITE) tablet, Take 1 tablet by mouth daily., Disp: , Rfl:  .  carbamazepine (TEGRETOL) 200 MG tablet, Take 400 mg by mouth 2 (two) times daily. , Disp: , Rfl:  .  Cyanocobalamin (VITAMIN B 12 PO), Take by mouth daily.  , Disp: , Rfl:  .  esomeprazole (NEXIUM) 20 MG capsule, Take 20 mg by mouth daily before breakfast., Disp: , Rfl:  .  fluticasone (FLONASE) 50 MCG/ACT nasal spray,  Place 2 sprays into both nostrils daily., Disp: 16 g, Rfl: 6 .  levothyroxine (SYNTHROID, LEVOTHROID) 175 MCG tablet, Take 175 mcg by mouth daily.  , Disp: , Rfl:  .  lisinopril (PRINIVIL,ZESTRIL) 10 MG tablet, TAKE 1 TABLET BY MOUTH EVERY DAY, Disp: 90 tablet, Rfl: 0 .  Multiple Vitamin (MULTIVITAMIN) capsule, Take 1 capsule by mouth daily.  , Disp: , Rfl:  .  Omega-3 Fatty Acids (FISH OIL PO), Take by mouth daily.  , Disp: , Rfl:  .  polyethylene glycol powder (GLYCOLAX/MIRALAX) powder, Take 17 grams daily., Disp: 255 g, Rfl: 3 .  pravastatin (PRAVACHOL) 20 MG tablet, TAKE 1 TABLET(20 MG) BY MOUTH DAILY, Disp: 90 tablet, Rfl: 1 .  senna-docusate (SENOKOT S) 8.6-50 MG per tablet, Take 1 tablet by mouth at bedtime as needed., Disp: 30 tablet, Rfl: 1 .  zonisamide (ZONEGRAN) 50 MG capsule, takes 162m twice a day, Disp: , Rfl: 1 .  sertraline (ZOLOFT) 50 MG tablet, Take 1.5 tablets (75 mg total) by mouth daily., Disp: 45 tablet, Rfl: 1  EXAM:  Filed Vitals:   10/05/15 0954  BP: 120/72  Pulse: 71  Temp: 98.1 F (36.7 C)    Body mass index is 35.08 kg/(m^2).  GENERAL: vitals reviewed and listed above, alert, oriented, appears well hydrated and in no acute distress  HEENT: atraumatic, conjunttiva clear, no obvious abnormalities on inspection of external nose and ears  NECK: no obvious masses on inspection  LUNGS: clear to auscultation bilaterally, no wheezes, rales or rhonchi, good air movement  CV: HRRR, no peripheral edema  ABD: BS+, soft, NTTP  MS: moves all extremities without noticeable abnormality  PSYCH: pleasant and cooperative, no obvious depression or anxiety  ASSESSMENT AND PLAN:  Discussed the following assessment and plan:  Nausea without vomiting - Plan: CMP with eGFR, H. pylori antigen, stool, Lipase, [place]domen Limited RUQ  Seizure disorder (HCC)  Recurrent major depressive disorder, in full remission ([place]  GAD (generalized anxiety disorder)  -we  discussed possible serious and likely etiologies, workup and treatment, treatment risks and return precautions - likely medication side effect given similar symptoms in the past -after this discussion, SRoshawnaopted for labs/US to r/o and check for hyponatremia, decrease zoloft, continue cbt -she refuses to see psych, she also is reluctant to come off zoloft - advise will need to taper off if recurrent hyponatremia and nausea from th emedicine, I think psych input on choice of another medication would be helpful and this is what I advised -follow up advised  in 1 month -of course, we advised Beverly Ballard  to return or seek care immediately if symptoms worsen, thoughts of self harm, SI or persist or new concerns arise.   Patient Instructions  BEFORE YOU LEAVE: -Wendie Simmer, obtain recent notes from neurologist -labs -keep follow up as scheduled  We placed a referral for you as discussed for the ultrasound to look at your gallbladder. It usually takes about 1-2 weeks to process and schedule this referral. If you have not heard from [place] regarding this appointment in 2 weeks please contact our office.  Continue counseling with Dr. Glennon Hamilton.  Decrease zoloft to 17m. I sent a new prescription. After labs and studies will will determine next step. Would advise seeing psychiatry if worsening depression.        KColin BentonR.

## 2015-10-07 ENCOUNTER — Other Ambulatory Visit: Payer: Self-pay | Admitting: Family Medicine

## 2015-10-07 MED ORDER — SERTRALINE HCL 100 MG PO TABS: 100.0000 mg | ORAL_TABLET | Freq: Every day | ORAL | Status: DC

## 2015-10-09 DIAGNOSIS — J3081 Allergic rhinitis due to animal (cat) (dog) hair and dander: Secondary | ICD-10-CM | POA: Diagnosis not present

## 2015-10-09 DIAGNOSIS — J3089 Other allergic rhinitis: Secondary | ICD-10-CM | POA: Diagnosis not present

## 2015-10-09 DIAGNOSIS — J301 Allergic rhinitis due to pollen: Secondary | ICD-10-CM | POA: Diagnosis not present

## 2015-10-11 ENCOUNTER — Other Ambulatory Visit: Payer: Self-pay | Admitting: Family Medicine

## 2015-10-12 ENCOUNTER — Ambulatory Visit
Admission: RE | Admit: 2015-10-12 | Discharge: 2015-10-12 | Disposition: A | Discharge status: Home / Self Care | Payer: Medicare HMO | Source: Ambulatory Visit | Attending: Family Medicine | Admitting: Family Medicine

## 2015-10-12 DIAGNOSIS — R1011 Right upper quadrant pain: Secondary | ICD-10-CM | POA: Diagnosis not present

## 2015-10-12 DIAGNOSIS — R110 Nausea: Secondary | ICD-10-CM

## 2015-10-22 ENCOUNTER — Ambulatory Visit (INDEPENDENT_AMBULATORY_CARE_PROVIDER_SITE_OTHER): Payer: Medicare HMO | Admitting: Psychology

## 2015-10-22 DIAGNOSIS — F411 Generalized anxiety disorder: Secondary | ICD-10-CM

## 2015-10-22 DIAGNOSIS — F331 Major depressive disorder, recurrent, moderate: Secondary | ICD-10-CM

## 2015-10-29 DIAGNOSIS — J301 Allergic rhinitis due to pollen: Secondary | ICD-10-CM | POA: Diagnosis not present

## 2015-10-29 DIAGNOSIS — J3089 Other allergic rhinitis: Secondary | ICD-10-CM | POA: Diagnosis not present

## 2015-10-29 DIAGNOSIS — J3081 Allergic rhinitis due to animal (cat) (dog) hair and dander: Secondary | ICD-10-CM | POA: Diagnosis not present

## 2015-11-01 ENCOUNTER — Ambulatory Visit (INDEPENDENT_AMBULATORY_CARE_PROVIDER_SITE_OTHER): Payer: Medicare HMO | Admitting: Family Medicine

## 2015-11-01 ENCOUNTER — Encounter: Payer: Self-pay | Admitting: Family Medicine

## 2015-11-01 ENCOUNTER — Encounter: Payer: Medicare HMO | Admitting: Family Medicine

## 2015-11-01 VITALS — BP 120/64 | HR 74 | Temp 98.6°F | Ht 65.0 in | Wt 203.0 lb

## 2015-11-01 DIAGNOSIS — M8580 Other specified disorders of bone density and structure, unspecified site: Secondary | ICD-10-CM

## 2015-11-01 DIAGNOSIS — K219 Gastro-esophageal reflux disease without esophagitis: Secondary | ICD-10-CM | POA: Diagnosis not present

## 2015-11-01 DIAGNOSIS — E785 Hyperlipidemia, unspecified: Secondary | ICD-10-CM | POA: Diagnosis not present

## 2015-11-01 DIAGNOSIS — Z6833 Body mass index (BMI) 33.0-33.9, adult: Secondary | ICD-10-CM

## 2015-11-01 DIAGNOSIS — Z0000 Encounter for general adult medical examination without abnormal findings: Secondary | ICD-10-CM

## 2015-11-01 DIAGNOSIS — E039 Hypothyroidism, unspecified: Secondary | ICD-10-CM | POA: Diagnosis not present

## 2015-11-01 DIAGNOSIS — R110 Nausea: Secondary | ICD-10-CM | POA: Diagnosis not present

## 2015-11-01 DIAGNOSIS — I10 Essential (primary) hypertension: Secondary | ICD-10-CM

## 2015-11-01 DIAGNOSIS — G40909 Epilepsy, unspecified, not intractable, without status epilepticus: Secondary | ICD-10-CM

## 2015-11-01 LAB — CBC WITH DIFFERENTIAL/PLATELET
BASOS PCT: 0.1 % (ref 0.0–3.0)
Basophils Absolute: 0 10*3/uL (ref 0.0–0.1)
EOS PCT: 0 % (ref 0.0–5.0)
Eosinophils Absolute: 0 10*3/uL (ref 0.0–0.7)
HEMATOCRIT: 39.8 % (ref 36.0–46.0)
Hemoglobin: 13.7 g/dL (ref 12.0–15.0)
LYMPHS PCT: 27.5 % (ref 12.0–46.0)
Lymphs Abs: 1.5 10*3/uL (ref 0.7–4.0)
MCHC: 34.3 g/dL (ref 30.0–36.0)
MCV: 93.6 fl (ref 78.0–100.0)
MONOS PCT: 8.8 % (ref 3.0–12.0)
Monocytes Absolute: 0.5 10*3/uL (ref 0.1–1.0)
NEUTROS ABS: 3.5 10*3/uL (ref 1.4–7.7)
Neutrophils Relative %: 63.6 % (ref 43.0–77.0)
PLATELETS: 201 10*3/uL (ref 150.0–400.0)
RBC: 4.26 Mil/uL (ref 3.87–5.11)
RDW: 12.2 % (ref 11.5–15.5)
WBC: 5.5 10*3/uL (ref 4.0–10.5)

## 2015-11-01 LAB — LIPID PANEL
CHOLESTEROL: 151 mg/dL (ref 0–200)
HDL: 54.6 mg/dL (ref >39.00)
LDL Cholesterol: 83 mg/dL (ref 0–99)
NonHDL: 95.97
Total CHOL/HDL Ratio: 3
Triglycerides: 63 mg/dL (ref 0.0–149.0)
VLDL: 12.6 mg/dL (ref 0.0–40.0)

## 2015-11-01 LAB — HEMOGLOBIN A1C: Hgb A1c MFr Bld: 5.6 % (ref 4.6–6.5)

## 2015-11-01 LAB — TSH: TSH: 0.15 u[IU]/mL — ABNORMAL LOW (ref 0.35–4.50)

## 2015-11-01 MED ORDER — LISINOPRIL 5 MG PO TABS: 5.0000 mg | ORAL_TABLET | Freq: Every day | ORAL | Status: DC

## 2015-11-01 NOTE — Patient Instructions (Addendum)
BEFORE YOU LEAVE: -follow up: in 3 months -labs  Decrease lisinopril to 5mg  daily - I sent a new prescription to the pharmacy.  Can try Vit B6 for the nausea. Discuss with pharmacist[facility]  Follow up with GI as planned.  We have ordered labs or studies at this visit. It can take up to 1-2 weeks for results and processing. IF results require follow up or explanation, we will call you with instructions. Clinically stable results will be released to your [facility]. If you have not heard from [place] or cannot find your results in [facility] in 2 weeks please contact our office at [phone].  If you are not yet signed up for [facility], please consider signing up.  Schedule annual exam with your gynecologist   We recommend the following healthy lifestyle: 1) Small portions - eat off of salad plate instead of dinner plate 2) Eat a healthy clean diet with avoidance of (less then 1 serving per week) processed foods, sweetened drinks, white starches, red meat, fast foods and sweets and consisting of: * 5-9 servings per day of fresh or frozen fruits and vegetables (not corn or potatoes, not dried or canned) *nuts and seeds, beans *olives and olive oil *small portions of lean meats such as fish and white chicken  *small portions of whole grains 3)Get at least 150 minutes of sweaty aerobic exercise per week 4)reduce stress - counseling, meditation, relaxation to balance other aspects of your life

## 2015-11-01 NOTE — Progress Notes (Signed)
Medicare Annual Preventive Care Visit  (initial annual wellness or annual wellness exam)  Concerns and/or follow up today:  Hypothyroidism: -managed by Dr. Wilson Singer but reports no thryoid check in almost 1 year, has follow up in august -meds: synthroid  HTN/HLD/Obesity: -meds: lisinopril 10mg , pravastatin, ASA, fish oil -reports husband had heart dz and they have really changed diet and are exercising - has lost some wt -feel bp may be running low since losing wt - occ feels a little dizzy when stands up -no CP, SOB, DOE, HA  Nausea/Anorexia/Constipation/GERD: -seeing GI for this - has appt next week -chronic nausea, not better with normal sodium and improved mood -no SOB, unintentional weight loss, fevers -take nexium 20mg  -does not like to take antiemetics  Depression: -meds: zoloft -long hx of MDD, saw Dr. Glennon Hamilton and this really helped -recently was able to increase zoloft again and is doing much better - does not do well on lower dose -reports relationship with husband improving -denies SI, thoughts of self harm  Seizure disorder: -managed by neurology, Dr. Sheppard Evens at [facility] -neurology advised change from tegretol to lamotrigine due to hyponatremia -no recent seizures  ROS: negative for report of fevers, unintentional weight loss, vision changes, vision loss, hearing loss or change, chest pain, sob, hemoptysis, melena, hematochezia, hematuria, genital discharge or lesions, falls, bleeding or bruising, loc, thoughts of suicide or self harm, memory loss  1.) Patient-completed health risk assessment  - completed and reviewed, see scanned documentation  2.) Review of Medical History: -PMH, PSH, Family History and current specialty and care providers reviewed and updated and listed below  - see scanned in document in chart and below  Past Medical History  Diagnosis Date  . ALLERGIC RHINITIS 05/27/2007    uses Flonase daily as needed and ALbuterol inhaler prn;gets Allergy  shots  . Anxiety and depression 12/19/2009  . LOW BACK PAIN 05/27/2007  . Essential hypertension, benign 06/26/2009  . Hyperlipidemia     takes Pravastatin daily  . History of migraine many yrs ago  . Seizure disorder [facility])     reports remote, takes Tegretol daily, followed by neurology  . Numbness     left toes   . Arthritis   . History of gout   . GERD 05/27/2007    takes Nexium daily  . Constipation   . Diverticulosis   . Insomnia     but doesn't take any meds  . Heart murmur     yrs ago  . Environmental allergies   . Hypothyroidism   . Depression   . Cancer ([place])     vaginal  . Allergy   . Osteoporosis   . Seizures ([place])     last 1983, none since then    Past Surgical History  Procedure Laterality Date  . Heel spurs Bilateral   . Tonsillectomy    . Adenoidectomy    . Colonoscopy    . Esophagogastroduodenoscopy    . Knee arthroplasty Left 11/07/2013    Procedure: COMPUTER ASSISTED TOTAL KNEE ARTHROPLASTY;  Surgeon: Marybelle Killings, MD;  Location: [place];  Service: Orthopedics;  Laterality: Left;  Left Total Knee Arthroplasty, Cemented, Computer Assist  . Back surgery      x2  . Total knee revision Left 09/19/2014    Procedure: LEFT TOTAL KNEE REVISION;  Surgeon: Leandrew Koyanagi, MD;  Location: [place];  Service: Orthopedics;  Laterality: Left;    Social History   Social History  . Marital Status: Married  Spouse Name: N/A  . Number of Children: 1  . Years of Education: N/A   Occupational History  . Retired    Social History Main Topics  . Smoking status: Former Smoker -- 0.25 packs/day for 20 years    Types: Cigarettes    Quit date: 09/08/1978  . Smokeless tobacco: Never Used     Comment: quit smokikng 63yrs ago  . Alcohol Use: No  . Drug Use: No  . Sexual Activity: No   Other Topics Concern  . Not on file   Social History Narrative    Family History  Problem Relation Age of Onset  . Arthritis Mother   . Heart murmur Other   . Colon cancer Neg Hx   .  Esophageal cancer Neg Hx   . Stomach cancer Neg Hx   . Rectal cancer Neg Hx     Current Outpatient Prescriptions on File Prior to Visit  Medication Sig Dispense Refill  . albuterol (PROVENTIL HFA;VENTOLIN HFA) 108 (90 BASE) MCG/ACT inhaler Inhale 1 puff into the lungs every 6 (six) hours as needed for wheezing or shortness of breath.    Marland Kitchen aspirin 81 MG tablet Take 81 mg by mouth daily.    . beta carotene w/minerals (OCUVITE) tablet Take 1 tablet by mouth daily.    . Cyanocobalamin (VITAMIN B 12 PO) Take by mouth daily.      Marland Kitchen esomeprazole (NEXIUM) 20 MG capsule Take 20 mg by mouth daily before breakfast.    . fluticasone (FLONASE) 50 MCG/ACT nasal spray Place 2 sprays into both nostrils daily. 16 g 6  . levothyroxine (SYNTHROID, LEVOTHROID) 175 MCG tablet Take 175 mcg by mouth daily.      . Multiple Vitamin (MULTIVITAMIN) capsule Take 1 capsule by mouth daily.      . Omega-3 Fatty Acids (FISH OIL PO) Take by mouth daily.      . polyethylene glycol powder (GLYCOLAX/MIRALAX) powder Take 17 grams daily. 255 g 3  . pravastatin (PRAVACHOL) 20 MG tablet TAKE 1 TABLET(20 MG) BY MOUTH DAILY 90 tablet 1  . senna-docusate (SENOKOT S) 8.6-50 MG per tablet Take 1 tablet by mouth at bedtime as needed. 30 tablet 1  . sertraline (ZOLOFT) 100 MG tablet Take 1 tablet (100 mg total) by mouth daily. 90 tablet 1  . zonisamide (ZONEGRAN) 50 MG capsule takes 200mg  twice a day  1   No current facility-administered medications on file prior to visit.     3.) Review of functional ability and level of safety:  Any difficulty hearing?  NO  History of falling?  NO  Any trouble with IADLs - using a phone, using transportation, grocery shopping, preparing meals, doing housework, doing laundry, taking medications and managing money? NO  Advance Directives? Yes, living will See summary of recommendations in Patient Instructions below.  4.) Physical Exam Filed Vitals:   11/01/15 0806  BP: 120/64  Pulse:  74  Temp: 98.6 F (37 C)   Estimated body mass index is 33.78 kg/(m^2) as calculated from the following:   Height as of this encounter: 5\' 5"  (1.651 m).   Weight as of this encounter: 203 lb (92.08 kg). Body mass index is 33.78 kg/(m^2).  EKG (optional): deferred  GENERAL: vitals reviewed and listed below, alert, oriented, appears well hydrated and in no acute distress  HEENT: head atraumatic, PERRLA, normal appearance of eyes, ears, nose and mouth. Visual acuity grossly intact. moist mucus membranes.  NECK: supple, no masses or lymphadenopathy  LUNGS: clear  to auscultation bilaterally, no rales, rhonchi or wheeze  ABD: BS+, soft, NTTP  CV: HRRR, no peripheral edema or cyanosis, normal pedal pulses  BREAST: deferred - sees gyn  ABDOMEN: bowel sounds normal, soft, non tender to palpation, no masses, no rebound or guarding  GU: deferred - sees gyn  SKIN: no rash or abnormal lesions  MS: normal gait, moves all extremities normally  NEURO: normal gait, speech and thought processing grossly intact, muscle tone grossly intact throughout  PSYCH: mildly flat affect, pleasant and cooperative  Cognitive function grossly intact  See patient instructions for recommendations.  Education and counseling regarding the above review of health provided with a plan for the following: -see scanned patient completed form for further details -fall prevention strategies discussed  -healthy lifestyle discussed -importance and resources for completing advanced directives discussed -see patient instructions below for any other recommendations provided  4)The following written screening schedule of preventive measures were reviewed with assessment and plan made per below, orders and patient instructions:      AAA screening done if applicable     Alcohol screening done     Obesity Screening and counseling done     STI screening (Hep C if born 1943-65) offered and per pt wishes     Tobacco  Screening done       Pneumococcal (PPSV23 -one dose after 64, one before if risk factors), influenza yearly and hepatitis B vaccines (if high risk - end stage renal disease, IV drugs, homosexual men, live in home for mentally retarded, hemophilia receiving factors) ASSESSMENT/PLAN: done      Screening mammograph (yearly if >40) ASSESSMENT/PLAN: utd       Screening Pap smear/pelvic exam (q2 years) ASSESSMENT/PLAN: n/a, sees gyn yearly per her report and agrees to schedule exam      Colorectal cancer screening (FOBT yearly or flex sig q4y or colonoscopy q10y or barium enema q4y) ASSESSMENT/PLAN: utd       Diabetes outpatient self-management training services ASSESSMENT/PLAN: utd or done      Bone mass measurements(covered q2y if indicated - estrogen def, osteoporosis, hyperparathyroid, vertebral abnormalities, osteoporosis or steroids) ASSESSMENT/PLAN: sees gyn for bone health      Screening for glaucoma(q1y if high risk - diabetes, FH, AA and > 50 or hispanic and > 65) ASSESSMENT/PLAN: utd or advised      Medical nutritional therapy for individuals with diabetes or renal disease ASSESSMENT/PLAN: see orders      Cardiovascular screening blood tests (lipids q5y) ASSESSMENT/PLAN: see orders and labs      Diabetes screening tests ASSESSMENT/PLAN: see orders and labs   7.) Summary:  Medicare annual wellness visit, subsequent - Plan: Hep C Antibody -risk factors and conditions per above assessment were discussed and treatment, recommendations and referrals were offered per documentation above and orders and patient instructions. -sees gyn for bone, breast, gyn health  Hypothyroidism, unspecified hypothyroidism type - Plan: TSH -will plan to send to her endocrinologist  Essential hypertension -with lifestyle changes may be running too low, will decrease lisinopril a little with follow up in  3 months to recheck  Gastroesophageal reflux disease, esophagitis presence not  specified Chronic nausea - Plan: CBC with Differential/Platelets -seeing GI again next week -mood better and sodium ok, so unsure of etiology, she wants to try something natural for this - small meals, healthy meal, stress management, b6 until further recs from GI next week, cont nexium for now  Osteopenia, stable on dexa 2010 - followed by gyn, was supposed  to repeat dexa 2013  Seizure disorder ([place]) -managed by her specialist  Hyperlipemia - Plan: Lipid Panel BMI 33.0-33.9,adult - Plan: Hemoglobin A1c -congratulated on changes, encouraged healthy med diet and regular exercise  Patient Instructions  BEFORE YOU LEAVE: -follow up: in 3 months -labs  Decrease lisinopril to 5mg  daily - I sent a new prescription to the pharmacy.  Can try Vit B6 for the nausea. Discuss with pharmacist[facility]  Follow up with GI as planned.  We have ordered labs or studies at this visit. It can take up to 1-2 weeks for results and processing. IF results require follow up or explanation, we will call you with instructions. Clinically stable results will be released to your [facility]. If you have not heard from [place] or cannot find your results in [facility] in 2 weeks please contact our office at [phone].  If you are not yet signed up for [facility], please consider signing up.  Schedule annual exam with your gynecologist   We recommend the following healthy lifestyle: 1) Small portions - eat off of salad plate instead of dinner plate 2) Eat a healthy clean diet with avoidance of (less then 1 serving per week) processed foods, sweetened drinks, white starches, red meat, fast foods and sweets and consisting of: * 5-9 servings per day of fresh or frozen fruits and vegetables (not corn or potatoes, not dried or canned) *nuts and seeds, beans *olives and olive oil *small portions of lean meats such as fish and white chicken  *small portions of whole grains 3)Get at least 150 minutes of sweaty  aerobic exercise per week 4)reduce stress - counseling, meditation, relaxation to balance other aspects of your life      Colin Benton R., DO

## 2015-11-01 NOTE — Progress Notes (Signed)
Pre visit review using our clinic review tool, if applicable. No additional management support is needed unless otherwise documented below in the visit note. 

## 2015-11-02 LAB — HEPATITIS C ANTIBODY: HCV Ab: NEGATIVE

## 2015-11-07 DIAGNOSIS — E0789 Other specified disorders of thyroid: Secondary | ICD-10-CM | POA: Diagnosis not present

## 2015-11-07 LAB — TSH: TSH: 0.1 — AB (ref <=5.90)

## 2015-11-09 ENCOUNTER — Encounter: Payer: Self-pay | Admitting: Gastroenterology

## 2015-11-09 ENCOUNTER — Ambulatory Visit (INDEPENDENT_AMBULATORY_CARE_PROVIDER_SITE_OTHER): Payer: Medicare HMO | Admitting: Gastroenterology

## 2015-11-09 VITALS — BP 120/64 | HR 80 | Ht 65.0 in | Wt 204.0 lb

## 2015-11-09 DIAGNOSIS — R110 Nausea: Secondary | ICD-10-CM | POA: Diagnosis not present

## 2015-11-09 DIAGNOSIS — R634 Abnormal weight loss: Secondary | ICD-10-CM

## 2015-11-09 DIAGNOSIS — R1012 Left upper quadrant pain: Secondary | ICD-10-CM

## 2015-11-09 MED ORDER — ONDANSETRON HCL 4 MG PO TABS: 4.0000 mg | ORAL_TABLET | Freq: Three times a day (TID) | ORAL | Status: DC | PRN

## 2015-11-09 NOTE — Patient Instructions (Signed)
You have been scheduled for a CT scan of the abdomen and pelvis at [facility] ([address]---this is in the same building as [facility]).   You are scheduled on Tuesday, 11/13/15 at 1:30 pm. You should arrive 15 minutes prior to your appointment time for registration. Please follow the written instructions below on the day of your exam:  WARNING: IF YOU ARE ALLERGIC TO IODINE/X-RAY DYE, PLEASE NOTIFY RADIOLOGY IMMEDIATELY AT [phone]! YOU WILL BE GIVEN A 13 HOUR PREMEDICATION PREP.  1) Do not eat or drink anything after 9:30 am (4 hours prior to your test) 2) You have been given 2 bottles of oral contrast to drink. The solution may taste better if refrigerated, but do NOT add ice or any other liquid to this solution. Shake well before drinking.    Drink 1 bottle of contrast @ 12:30 pm (2 hours prior to your exam)  Drink 1 bottle of contrast @ 1:30 pm (1 hour prior to your exam)  You may take any medications as prescribed with a small amount of water except for the following: Metformin, Glucophage, Glucovance, Avandamet, Riomet, Fortamet, Actoplus Met, Janumet, Glumetza or Metaglip. The above medications must be held the day of the exam AND 48 hours after the exam.  The purpose of you drinking the oral contrast is to aid in the visualization of your intestinal tract. The contrast solution may cause some diarrhea. Before your exam is started, you will be given a small amount of fluid to drink. Depending on your individual set of symptoms, you may also receive an intravenous injection of x-ray contrast/dye. Plan on being at [facility] for 30 minutes or longer, depending on the type of exam you are having performed.  This test typically takes 30-45 minutes to complete.  If you have any questions regarding your exam or if you need to reschedule, you may call the CT department at [phone] between the hours of 8:00 am and 5:00 pm,  Monday-Friday.  ________________________________________________________________________  We have sent the following medications to your pharmacy for you to pick up at your convenience: zofran  If you are age 5 or older, your body mass index should be between 23-30. Your Body mass index is 33.95 kg/(m^2). If this is out of the aforementioned range listed, please consider follow up with your Primary Care Provider.  If you are age 25 or younger, your body mass index should be between 19-25. Your Body mass index is 33.95 kg/(m^2). If this is out of the aformentioned range listed, please consider follow up with your Primary Care Provider.

## 2015-11-09 NOTE — Progress Notes (Signed)
HPI :  LAST VISIT: 70 y/o female seen in consultation for nausea, dysphagia, and constipation from Dr. Colin Benton.  Patient reports she has had some nausea bothering her for several months, around at least a year or so. She reports she has had some low sodium levels recently and has had some changes in her seizure medications and unsure if related. She was on tegretol longstanding, and has since changed over to lamotrigine, currently in transition. She won't be off tegretol for another 4 weeks or so, currently tapering. She has not noticed a change in nausea thus far but has not yet decreased her tegretol. Nausea is intermittent and sporadic. She normally does not vomit but has had some episodes of vomiting with her nausea. She has not changed her diet much. The nausea comes and goes, is intermittent. The nausea is daily. Nausea does not seem related to eating for the most part. Vomiting does not seem postprandial for the most part, has happened rarely.   She has some ongoing dysphagia with solids and liquids otherwise. Has been ongoing for several months. No odynophagia. She also has a sense of globus as well. No weight loss.   She has had a knee surgery but otherwise no new medications. No chronic narcotics. She denies tylenol PRN for pain, no NSAIDs. She takes nexium 20mg  daily. She has not tried anything else for her nausea thus far.   No prior upper endoscopy.   She otherwise has chronic longstanding constipation for years, and bothering her currently. She has up to one BM per week when severe, usually every 4 days or so. She has taken some senna which helps. She has not tried Miralax recently. She denies blood in the stools.   Colonoscopy 2009 was normal.    SINCE LAST VISIT:  EGD was done 05/07/15 for chronic nausea and for dysphagia. I did not see any evidence of pathology noted to cause dysphagia. Otherwise she didn't have any clear pathology to cause nausea on her exam. She had some  benign fundic gland polyps and biopsies of the stomach showed no evidence for H pylori. At the time of the exam she had reported her nausea felt better.   Unfortunately she reports her nausea has persisted. She previously had been on tegretol and was switched to zonisamide 100mg , which helped regulate her sodium levels. Despite changing her medications the nausea has not changed. Sometimes nausea it worse after eating but it is present all the time for the most part. She denies headaches. She is taking nexium once daily. She thinks she has lost about 17-18 lbs over 2 months. She is not vomiting her food. She has some postprandial discomfort in her left upper abdomen after she eats. Her discomfort she thinks is there all the time, can be worse with eating. Sometimes the pain goes into her back which is new. She does endorse early satiety which leads to poor appetite and doesn't eat much. She has some zofran which does help her symptoms when she takes it, but she tries not to take. She denies any dysphagia at present time.  US showed some sludge in the GB but no stones, and some slight fatty liver. She does not drink any alcohol. LFTs normal.    Past Medical History  Diagnosis Date  . ALLERGIC RHINITIS 05/27/2007    uses Flonase daily as needed and ALbuterol inhaler prn;gets Allergy shots  . Anxiety and depression 12/19/2009  . LOW BACK PAIN 05/27/2007  . Essential  hypertension, benign 06/26/2009  . Hyperlipidemia     takes Pravastatin daily  . History of migraine many yrs ago  . Seizure disorder [facility])     reports remote, takes Tegretol daily, followed by neurology  . Numbness     left toes   . Arthritis   . History of gout   . GERD 05/27/2007    takes Nexium daily  . Constipation   . Diverticulosis   . Insomnia     but doesn't take any meds  . Heart murmur     yrs ago  . Environmental allergies   . Hypothyroidism   . Depression   . Cancer ([place])     vaginal  . Allergy   . Osteoporosis   .  Seizures ([place])     last 1983, none since then     Past Surgical History  Procedure Laterality Date  . Heel spurs Bilateral   . Tonsillectomy    . Adenoidectomy    . Colonoscopy    . Esophagogastroduodenoscopy    . Knee arthroplasty Left 11/07/2013    Procedure: COMPUTER ASSISTED TOTAL KNEE ARTHROPLASTY;  Surgeon: Marybelle Killings, MD;  Location: [place];  Service: Orthopedics;  Laterality: Left;  Left Total Knee Arthroplasty, Cemented, Computer Assist  . Back surgery      x2  . Total knee revision Left 09/19/2014    Procedure: LEFT TOTAL KNEE REVISION;  Surgeon: Leandrew Koyanagi, MD;  Location: [place];  Service: Orthopedics;  Laterality: Left;   Family History  Problem Relation Age of Onset  . Arthritis Mother   . Heart murmur Other   . Colon cancer Neg Hx   . Esophageal cancer Neg Hx   . Stomach cancer Neg Hx   . Rectal cancer Neg Hx    Social History  Substance Use Topics  . Smoking status: Former Smoker -- 0.25 packs/day for 20 years    Types: Cigarettes    Quit date: 09/08/1978  . Smokeless tobacco: Never Used     Comment: quit smokikng 31yrs ago  . Alcohol Use: No   Current Outpatient Prescriptions  Medication Sig Dispense Refill  . albuterol (PROVENTIL HFA;VENTOLIN HFA) 108 (90 BASE) MCG/ACT inhaler Inhale 1 puff into the lungs every 6 (six) hours as needed for wheezing or shortness of breath.    Marland Kitchen aspirin 81 MG tablet Take 81 mg by mouth daily.    . beta carotene w/minerals (OCUVITE) tablet Take 1 tablet by mouth daily.    . Cyanocobalamin (VITAMIN B 12 PO) Take by mouth daily.      Marland Kitchen esomeprazole (NEXIUM) 20 MG capsule Take 20 mg by mouth daily before breakfast.    . fluticasone (FLONASE) 50 MCG/ACT nasal spray Place 2 sprays into both nostrils daily. 16 g 6  . levothyroxine (SYNTHROID, LEVOTHROID) 175 MCG tablet Take 175 mcg by mouth daily.      Marland Kitchen lisinopril (PRINIVIL,ZESTRIL) 5 MG tablet Take 1 tablet (5 mg total) by mouth daily. 90 tablet 3  . Multiple Vitamin  (MULTIVITAMIN) capsule Take 1 capsule by mouth daily.      . Omega-3 Fatty Acids (FISH OIL PO) Take by mouth daily.      . polyethylene glycol powder (GLYCOLAX/MIRALAX) powder Take 17 grams daily. 255 g 3  . pravastatin (PRAVACHOL) 20 MG tablet TAKE 1 TABLET(20 MG) BY MOUTH DAILY 90 tablet 1  . senna-docusate (SENOKOT S) 8.6-50 MG per tablet Take 1 tablet by mouth at bedtime as needed. 30 tablet  1  . sertraline (ZOLOFT) 100 MG tablet Take 1 tablet (100 mg total) by mouth daily. 90 tablet 1  . zonisamide (ZONEGRAN) 100 MG capsule Take 200 mg by mouth 2 (two) times daily.     No current facility-administered medications for this visit.   Allergies  Allergen Reactions  . Adhesive [Tape]     ?  Blister after knee surgery  . Clarithromycin     Unsure of reaction    . Doxycycline     Interfered with seizure medication  . Nickel     Had to remove knee implant with nickel and replace it  . Other     Metal and perfumes     Review of Systems: All systems reviewed and negative except where noted in HPI.    US Abdomen Limited Ruq  10/12/2015  CLINICAL DATA:  Nausea, right upper quadrant pain EXAM: US ABDOMEN LIMITED - RIGHT UPPER QUADRANT COMPARISON:  None. FINDINGS: Gallbladder: Small amount of sludge within the gallbladder. No visible stones. Gallbladder wall is mildly thickened at 4 mm. The patient was tender over the gallbladder during the study. Common bile duct: Diameter: Normal caliber, 4 mm Liver: Coarse, increased echotexture which could reflect early fatty infiltration. No focal abnormality. IMPRESSION: No visible gallstones. There is some sludge within the gallbladder with mild wall thickening. The patient was tender over the gallbladder during the study. Cannot completely exclude acalculous cholecystitis. If this is a clinical concern, consider nuclear medicine hepatobiliary scan. Coarsened echotexture throughout the liver which may reflect early fatty infiltration. Electronically  Signed   By: Rolm Baptise M.D.   On: 10/12/2015 10:23   Lab Results  Component Value Date   ALT 13 10/05/2015   AST 19 10/05/2015   ALKPHOS 78 10/05/2015   BILITOT 0.3 10/05/2015    Lab Results  Component Value Date   CREATININE 0.77 10/05/2015   BUN 11 10/05/2015   NA 138 10/05/2015   K 4.8 10/05/2015   CL 104 10/05/2015   CO2 26 10/05/2015    Lab Results  Component Value Date   WBC 5.5 11/01/2015   HGB 13.7 11/01/2015   HCT 39.8 11/01/2015   MCV 93.6 11/01/2015   PLT 201.0 11/01/2015      Physical Exam: BP 120/64 mmHg  Pulse 80  Ht 5\' 5"  (1.651 m)  Wt 204 lb (92.534 kg)  BMI 33.95 kg/m2 Constitutional: Pleasant,well-developed, female in no acute distress. HEENT: Normocephalic and atraumatic. Conjunctivae are normal. No scleral icterus. Neck supple.  Cardiovascular: Normal rate, regular rhythm.  Pulmonary/chest: Effort normal and breath sounds normal. No wheezing, rales or rhonchi. Abdominal: Soft, nondistended, nontender. Bowel sounds active throughout. There are no masses palpable.  Extremities: no edema Lymphadenopathy: No cervical adenopathy noted. Neurological: Alert and oriented to person place and time. Skin: Skin is warm and dry. No rashes noted. Psychiatric: Normal mood and affect. Behavior is normal.   ASSESSMENT AND PLAN: 70 y/o female with history of seizure disorder who has developed persistent nausea over the past several months now. At one time she had benefit with switch from Tegretol but this was short lasting. I performed an EGD which did not show any clear etiology. Since last visit she has had some LUQ abdominal discomfort with radiation to the back at times, and now losing some weight. Also with some early satiety. Symptoms seem atypical for biliary colic (LUQ pain), [place] doesn't show gallstones, with some suspected fatty liver although her liver enzymes are normal. Otherwise rest of  her labs look okay. Given her discomfort and weight loss,  recommending CT abdomen / pelvis at this time. If this is unrevealing given her early satiety we may consider a gastric emptying study as well. She denies any headaches but if nausea persist may also consider CNS imaging. I encouraged her to use the Zofran as it helps her symptoms but she rarely uses it. Provided refills of this for her. She agreed with the plan, we will await the results.   Scott Cellar, MD [facility] Gastroenterology Pager [phone]

## 2015-11-12 DIAGNOSIS — E789 Disorder of lipoprotein metabolism, unspecified: Secondary | ICD-10-CM | POA: Diagnosis not present

## 2015-11-12 DIAGNOSIS — E032 Hypothyroidism due to medicaments and other exogenous substances: Secondary | ICD-10-CM | POA: Diagnosis not present

## 2015-11-12 DIAGNOSIS — I10 Essential (primary) hypertension: Secondary | ICD-10-CM | POA: Diagnosis not present

## 2015-11-12 LAB — HEPATIC FUNCTION PANEL
ALT: 28 (ref 7–35)
AST: 20 (ref 13–35)
Alkaline Phosphatase: 83 (ref 25–125)
BILIRUBIN, TOTAL: 0.3

## 2015-11-12 LAB — BASIC METABOLIC PANEL
BUN: 8 (ref 4–21)
Creatinine: 0.8 (ref <=1.1)
GLUCOSE: 95
Potassium: 4.1 (ref 3.4–5.3)
SODIUM: 142 (ref 137–147)

## 2015-11-13 ENCOUNTER — Inpatient Hospital Stay: Admission: RE | Admit: 2015-11-13 | Payer: Self-pay | Source: Ambulatory Visit

## 2015-11-16 ENCOUNTER — Ambulatory Visit (INDEPENDENT_AMBULATORY_CARE_PROVIDER_SITE_OTHER)
Admission: RE | Admit: 2015-11-16 | Discharge: 2015-11-16 | Disposition: A | Discharge status: Home / Self Care | Payer: Medicare HMO | Source: Ambulatory Visit | Attending: Gastroenterology | Admitting: Gastroenterology

## 2015-11-16 DIAGNOSIS — R634 Abnormal weight loss: Secondary | ICD-10-CM

## 2015-11-16 DIAGNOSIS — R1012 Left upper quadrant pain: Secondary | ICD-10-CM | POA: Diagnosis not present

## 2015-11-16 DIAGNOSIS — R110 Nausea: Secondary | ICD-10-CM | POA: Diagnosis not present

## 2015-11-16 MED ORDER — IOPAMIDOL (ISOVUE-300) INJECTION 61%
100.0000 mL | Freq: Once | INTRAVENOUS | Status: AC | PRN
  Administered 2015-11-16: 80 mL via INTRAVENOUS

## 2015-11-19 ENCOUNTER — Other Ambulatory Visit: Payer: Self-pay | Admitting: *Deleted

## 2015-11-19 ENCOUNTER — Telehealth: Payer: Self-pay | Admitting: Family Medicine

## 2015-11-19 DIAGNOSIS — R6881 Early satiety: Secondary | ICD-10-CM

## 2015-11-19 DIAGNOSIS — I700 Atherosclerosis of aorta: Secondary | ICD-10-CM | POA: Insufficient documentation

## 2015-11-19 NOTE — Progress Notes (Signed)
HPI:  Acute visit for:  Aortic atherosclerosis/CAD: -incidental findings on CT scan done with GI and she was advised to see PCP and consider stress test -she is really working on diet; no regular exercise -father had heart attach in his 80s and she is very worried about her heart -no CP, DOE, sob, swelling, palpitations -PMH sig for HTN, HLD, Obesity on asa, lisinopril, statin -denies:   ROS: See pertinent positives and negatives per HPI.  Past Medical History:  Diagnosis Date  . ALLERGIC RHINITIS 05/27/2007   uses Flonase daily as needed and ALbuterol inhaler prn;gets Allergy shots  . Allergy   . Anxiety and depression 12/19/2009  . Arthritis   . Cancer ([facility])    vaginal  . Constipation   . Depression   . Diverticulosis   . Environmental allergies   . Essential hypertension, benign 06/26/2009  . GERD 05/27/2007   takes Nexium daily  . Heart murmur    yrs ago  . History of gout   . History of migraine many yrs ago  . Hyperlipidemia    takes Pravastatin daily  . Hypothyroidism   . Insomnia    but doesn't take any meds  . LOW BACK PAIN 05/27/2007  . Numbness    left toes   . Osteoporosis   . Seizure disorder [facility])    reports remote, takes Tegretol daily, followed by neurology  . Seizures ([place])    last 1983, none since then    Past Surgical History:  Procedure Laterality Date  . ADENOIDECTOMY    . BACK SURGERY     x2  . COLONOSCOPY    . ESOPHAGOGASTRODUODENOSCOPY    . Heel spurs Bilateral   . KNEE ARTHROPLASTY Left 11/07/2013   Procedure: COMPUTER ASSISTED TOTAL KNEE ARTHROPLASTY;  Surgeon: Marybelle Killings, MD;  Location: [place];  Service: Orthopedics;  Laterality: Left;  Left Total Knee Arthroplasty, Cemented, Computer Assist  . TONSILLECTOMY    . TOTAL KNEE REVISION Left 09/19/2014   Procedure: LEFT TOTAL KNEE REVISION;  Surgeon: Leandrew Koyanagi, MD;  Location: [place];  Service: Orthopedics;  Laterality: Left;    Family History  Problem Relation Age of Onset  .  Arthritis Mother   . Heart murmur Other   . Colon cancer Neg Hx   . Esophageal cancer Neg Hx   . Stomach cancer Neg Hx   . Rectal cancer Neg Hx     Social History   Social History  . Marital status: Married    Spouse name: N/A  . Number of children: 1  . Years of education: N/A   Occupational History  . Retired    Social History Main Topics  . Smoking status: Former Smoker    Packs/day: 0.25    Years: 20.00    Types: Cigarettes    Quit date: 09/08/1978  . Smokeless tobacco: Never Used     Comment: quit smokikng 46yrs ago  . Alcohol use No  . Drug use: No  . Sexual activity: No   Other Topics Concern  . None   Social History Narrative  . None     Current Outpatient Prescriptions:  .  albuterol (PROVENTIL HFA;VENTOLIN HFA) 108 (90 BASE) MCG/ACT inhaler, Inhale 1 puff into the lungs every 6 (six) hours as needed for wheezing or shortness of breath., Disp: , Rfl:  .  aspirin 81 MG tablet, Take 81 mg by mouth daily., Disp: , Rfl:  .  beta carotene w/minerals (OCUVITE) tablet, Take 1  tablet by mouth daily., Disp: , Rfl:  .  Cyanocobalamin (VITAMIN B 12 PO), Take by mouth daily.  , Disp: , Rfl:  .  esomeprazole (NEXIUM) 20 MG capsule, Take 20 mg by mouth daily before breakfast., Disp: , Rfl:  .  fluticasone (FLONASE) 50 MCG/ACT nasal spray, Place 2 sprays into both nostrils daily., Disp: 16 g, Rfl: 6 .  Levothyroxine Sodium 150 MCG CAPS, Take 150 mcg by mouth daily. , Disp: , Rfl:  .  lisinopril (PRINIVIL,ZESTRIL) 5 MG tablet, Take 1 tablet (5 mg total) by mouth daily., Disp: 90 tablet, Rfl: 3 .  Multiple Vitamin (MULTIVITAMIN) capsule, Take 1 capsule by mouth daily.  , Disp: , Rfl:  .  Omega-3 Fatty Acids (FISH OIL PO), Take by mouth daily.  , Disp: , Rfl:  .  ondansetron (ZOFRAN) 4 MG tablet, Take 1 tablet (4 mg total) by mouth every 8 (eight) hours as needed for nausea or vomiting., Disp: 30 tablet, Rfl: 1 .  polyethylene glycol powder (GLYCOLAX/MIRALAX) powder, Take 17  grams daily., Disp: 255 g, Rfl: 3 .  pravastatin (PRAVACHOL) 20 MG tablet, TAKE 1 TABLET(20 MG) BY MOUTH DAILY, Disp: 90 tablet, Rfl: 1 .  senna-docusate (SENOKOT S) 8.6-50 MG per tablet, Take 1 tablet by mouth at bedtime as needed., Disp: 30 tablet, Rfl: 1 .  sertraline (ZOLOFT) 100 MG tablet, Take 1 tablet (100 mg total) by mouth daily., Disp: 90 tablet, Rfl: 1 .  zonisamide (ZONEGRAN) 100 MG capsule, Take 200 mg by mouth 2 (two) times daily., Disp: , Rfl:   EXAM:  Vitals:   11/20/15 1011  BP: 120/64  Pulse: 77  Temp: 97.9 F (36.6 C)    Body mass index is 33.8 kg/m.  GENERAL: vitals reviewed and listed above, alert, oriented, appears well hydrated and in no acute distress  HEENT: atraumatic, conjunttiva clear, no obvious abnormalities on inspection of external nose and ears  NECK: no obvious masses on inspection  LUNGS: clear to auscultation bilaterally, no wheezes, rales or rhonchi, good air movement  CV: HRRR, no peripheral edema  MS: moves all extremities without noticeable abnormality  PSYCH: pleasant and cooperative, no obvious depression or anxiety  ASSESSMENT AND PLAN:  Discussed the following assessment and plan:  Coronary artery disease involving native coronary artery of native heart without angina pectoris - Plan: Exercise Tolerance Test  Aortic atherosclerosis (HCC)  Essential hypertension  Hyperlipemia  Obesity, BMI unknown  -discussed implications, options for further evaluation and risk factor reduction -she wants to do a stress test before embarking on initiation of gradual exercise  -lifestyle recs discussed at length -Patient advised to return or notify a doctor immediately if symptoms worsen or persist or new concerns arise.  Patient Instructions  BEFORE YOU LEAVE: -follow up: keep follow up as scheduled  Get the stress test - we placed a referral for you as discussed. It usually takes about 1-2 weeks to process and schedule this referral.  If you have not heard from [place] regarding this appointment in 2 weeks please contact our office.  We recommend the following healthy lifestyle: 1) Small portions - eat off of salad plate instead of dinner plate 2) Eat a healthy clean diet with avoidance of (less then 1 serving per week) processed foods, sweetened drinks, white starches, red meat, fast foods and sweets and consisting of: * 5-9 servings per day of fresh or frozen fruits and vegetables (not corn or potatoes, not dried or canned) *nuts and seeds, beans *olives  and olive oil *small portions of lean meats such as fish and white chicken  *small portions of whole grains 3)Get at least 150 minutes of sweaty aerobic exercise per week - start with 5 minutes per day and increase by 5 minutes weekly until 30 minutes daily 4)reduce stress - counseling, meditation, relaxation to balance other aspects of your life      Colin Benton R., DO

## 2015-11-19 NOTE — Telephone Encounter (Signed)
I called the pt to get more information.  She stated a CT scan was ordered and she was told there was something wrong with her heart and Dr Luvenia Starch told her Dr Maudie Mercury could see the CT results in her chart and she may want to order a stress test or have her see a specialist?

## 2015-11-19 NOTE — Telephone Encounter (Signed)
I called the pt and scheduled an appt for 8/1.

## 2015-11-19 NOTE — Telephone Encounter (Signed)
Pt state she had a test done at Dr. Luvenia Starch and would like to know if she should come in?

## 2015-11-19 NOTE — Telephone Encounter (Signed)
Please have her schedule appt and we can go over these findings. Thanks.

## 2015-11-20 ENCOUNTER — Encounter: Payer: Self-pay | Admitting: Family Medicine

## 2015-11-20 ENCOUNTER — Ambulatory Visit (INDEPENDENT_AMBULATORY_CARE_PROVIDER_SITE_OTHER): Payer: Medicare HMO | Admitting: Family Medicine

## 2015-11-20 VITALS — BP 120/64 | HR 77 | Temp 97.9°F | Ht 65.0 in | Wt 203.1 lb

## 2015-11-20 DIAGNOSIS — I2510 Atherosclerotic heart disease of native coronary artery without angina pectoris: Secondary | ICD-10-CM | POA: Diagnosis not present

## 2015-11-20 DIAGNOSIS — J3089 Other allergic rhinitis: Secondary | ICD-10-CM | POA: Diagnosis not present

## 2015-11-20 DIAGNOSIS — E669 Obesity, unspecified: Secondary | ICD-10-CM

## 2015-11-20 DIAGNOSIS — E785 Hyperlipidemia, unspecified: Secondary | ICD-10-CM

## 2015-11-20 DIAGNOSIS — I700 Atherosclerosis of aorta: Secondary | ICD-10-CM | POA: Diagnosis not present

## 2015-11-20 DIAGNOSIS — J3081 Allergic rhinitis due to animal (cat) (dog) hair and dander: Secondary | ICD-10-CM | POA: Diagnosis not present

## 2015-11-20 DIAGNOSIS — I10 Essential (primary) hypertension: Secondary | ICD-10-CM

## 2015-11-20 DIAGNOSIS — J301 Allergic rhinitis due to pollen: Secondary | ICD-10-CM | POA: Diagnosis not present

## 2015-11-20 NOTE — Patient Instructions (Signed)
BEFORE YOU LEAVE: -follow up: keep follow up as scheduled  Get the stress test - we placed a referral for you as discussed. It usually takes about 1-2 weeks to process and schedule this referral. If you have not heard from [place] regarding this appointment in 2 weeks please contact our office.  We recommend the following healthy lifestyle: 1) Small portions - eat off of salad plate instead of dinner plate 2) Eat a healthy clean diet with avoidance of (less then 1 serving per week) processed foods, sweetened drinks, white starches, red meat, fast foods and sweets and consisting of: * 5-9 servings per day of fresh or frozen fruits and vegetables (not corn or potatoes, not dried or canned) *nuts and seeds, beans *olives and olive oil *small portions of lean meats such as fish and white chicken  *small portions of whole grains 3)Get at least 150 minutes of sweaty aerobic exercise per week - start with 5 minutes per day and increase by 5 minutes weekly until 30 minutes daily 4)reduce stress - counseling, meditation, relaxation to balance other aspects of your life

## 2015-11-20 NOTE — Progress Notes (Signed)
Pre visit review using our clinic review tool, if applicable. No additional management support is needed unless otherwise documented below in the visit note. 

## 2015-11-27 DIAGNOSIS — J3081 Allergic rhinitis due to animal (cat) (dog) hair and dander: Secondary | ICD-10-CM | POA: Diagnosis not present

## 2015-11-27 DIAGNOSIS — J3089 Other allergic rhinitis: Secondary | ICD-10-CM | POA: Diagnosis not present

## 2015-11-27 DIAGNOSIS — J301 Allergic rhinitis due to pollen: Secondary | ICD-10-CM | POA: Diagnosis not present

## 2015-11-29 ENCOUNTER — Encounter (HOSPITAL_COMMUNITY): Payer: Self-pay

## 2015-11-29 ENCOUNTER — Encounter (HOSPITAL_COMMUNITY)
Admission: RE | Admit: 2015-11-29 | Discharge: 2015-11-29 | Disposition: A | Discharge status: Home / Self Care | Payer: Medicare HMO | Source: Ambulatory Visit | Attending: Gastroenterology | Admitting: Gastroenterology

## 2015-11-29 DIAGNOSIS — R6881 Early satiety: Secondary | ICD-10-CM | POA: Insufficient documentation

## 2015-11-29 MED ORDER — TECHNETIUM TC 99M SULFUR COLLOID FILTERED
2.1000 | Freq: Once | INTRAVENOUS | Status: AC | PRN
  Administered 2015-11-29: 2.1 via INTRADERMAL

## 2015-11-30 ENCOUNTER — Other Ambulatory Visit: Payer: Self-pay | Admitting: *Deleted

## 2015-11-30 DIAGNOSIS — R112 Nausea with vomiting, unspecified: Secondary | ICD-10-CM

## 2015-11-30 DIAGNOSIS — R110 Nausea: Secondary | ICD-10-CM

## 2015-12-03 ENCOUNTER — Ambulatory Visit: Payer: Medicare HMO | Admitting: Psychology

## 2015-12-03 ENCOUNTER — Other Ambulatory Visit: Payer: Self-pay

## 2015-12-03 ENCOUNTER — Other Ambulatory Visit (INDEPENDENT_AMBULATORY_CARE_PROVIDER_SITE_OTHER): Payer: Medicare HMO

## 2015-12-03 DIAGNOSIS — R110 Nausea: Secondary | ICD-10-CM | POA: Diagnosis not present

## 2015-12-03 DIAGNOSIS — R112 Nausea with vomiting, unspecified: Secondary | ICD-10-CM

## 2015-12-03 LAB — CORTISOL: CORTISOL PLASMA: 19.7 ug/dL

## 2015-12-03 LAB — CREATININE, SERUM: Creatinine, Ser: 0.83 mg/dL (ref 0.40–1.20)

## 2015-12-03 LAB — BUN: BUN: 11 mg/dL (ref 6–23)

## 2015-12-04 ENCOUNTER — Other Ambulatory Visit: Payer: Self-pay

## 2015-12-04 DIAGNOSIS — R112 Nausea with vomiting, unspecified: Secondary | ICD-10-CM

## 2015-12-06 ENCOUNTER — Inpatient Hospital Stay: Admission: RE | Admit: 2015-12-06 | Payer: Self-pay | Source: Ambulatory Visit

## 2015-12-07 ENCOUNTER — Ambulatory Visit
Admission: RE | Admit: 2015-12-07 | Discharge: 2015-12-07 | Disposition: A | Discharge status: Home / Self Care | Payer: Medicare HMO | Source: Ambulatory Visit | Attending: Gastroenterology | Admitting: Gastroenterology

## 2015-12-07 DIAGNOSIS — R112 Nausea with vomiting, unspecified: Secondary | ICD-10-CM

## 2015-12-07 MED ORDER — GADOBENATE DIMEGLUMINE 529 MG/ML IV SOLN
19.0000 mL | Freq: Once | INTRAVENOUS | Status: AC | PRN
  Administered 2015-12-07: 19 mL via INTRAVENOUS

## 2015-12-10 ENCOUNTER — Ambulatory Visit (INDEPENDENT_AMBULATORY_CARE_PROVIDER_SITE_OTHER): Payer: Medicare HMO | Admitting: Psychology

## 2015-12-10 DIAGNOSIS — F331 Major depressive disorder, recurrent, moderate: Secondary | ICD-10-CM

## 2015-12-10 DIAGNOSIS — J3089 Other allergic rhinitis: Secondary | ICD-10-CM | POA: Diagnosis not present

## 2015-12-10 DIAGNOSIS — J3081 Allergic rhinitis due to animal (cat) (dog) hair and dander: Secondary | ICD-10-CM | POA: Diagnosis not present

## 2015-12-10 DIAGNOSIS — F411 Generalized anxiety disorder: Secondary | ICD-10-CM | POA: Diagnosis not present

## 2015-12-10 DIAGNOSIS — J301 Allergic rhinitis due to pollen: Secondary | ICD-10-CM | POA: Diagnosis not present

## 2015-12-11 ENCOUNTER — Ambulatory Visit (INDEPENDENT_AMBULATORY_CARE_PROVIDER_SITE_OTHER): Payer: Medicare HMO

## 2015-12-11 DIAGNOSIS — I2510 Atherosclerotic heart disease of native coronary artery without angina pectoris: Secondary | ICD-10-CM

## 2015-12-11 LAB — EXERCISE TOLERANCE TEST
CHL CUP MPHR: 153 {beats}/min
CHL RATE OF PERCEIVED EXERTION: 15
CSEPHR: 85 %
Estimated workload: 7 METS
Exercise duration (min): 6 min
Exercise duration (sec): 0 s
Peak HR: 131 {beats}/min
Rest HR: 69 {beats}/min

## 2015-12-13 ENCOUNTER — Ambulatory Visit (INDEPENDENT_AMBULATORY_CARE_PROVIDER_SITE_OTHER): Payer: Medicare HMO | Admitting: Gastroenterology

## 2015-12-13 ENCOUNTER — Other Ambulatory Visit: Payer: Self-pay

## 2015-12-13 ENCOUNTER — Encounter: Payer: Self-pay | Admitting: Gastroenterology

## 2015-12-13 VITALS — BP 124/60 | HR 68 | Ht 65.0 in | Wt 201.4 lb

## 2015-12-13 DIAGNOSIS — F329 Major depressive disorder, single episode, unspecified: Secondary | ICD-10-CM

## 2015-12-13 DIAGNOSIS — F32A Depression, unspecified: Secondary | ICD-10-CM

## 2015-12-13 DIAGNOSIS — R110 Nausea: Secondary | ICD-10-CM | POA: Diagnosis not present

## 2015-12-13 DIAGNOSIS — R69 Illness, unspecified: Secondary | ICD-10-CM | POA: Diagnosis not present

## 2015-12-13 MED ORDER — ONDANSETRON HCL 4 MG PO TABS: 4.0000 mg | ORAL_TABLET | Freq: Three times a day (TID) | ORAL | 1 refills | Status: DC | PRN

## 2015-12-13 NOTE — Progress Notes (Signed)
HPI :  70 y/o female here for follow up for chronic nausea. See prior clinic notes for full details. She has had an extensive workup for this issue without a clear etiology. EGD was done 05/07/15 for chronic nausea and for dysphagia. I did not see any evidence of pathology noted to cause dysphagia. Otherwise she didn't have any clear pathology to cause nausea on her exam. She had some benign fundic gland polyps and biopsies of the stomach showed no evidence for H pylori.   CT abdomen pelvis performed since the last visit which did not show a clear etiology or any acute pathology. She had 2 vessel coronary artery disease which led to a stress test which was normal. She has had a normal gastric emptying study. MRI brain also performed which did not show any pathology to cause chronic nausea. Stable pituitary cyst noted. She has had a fasting AM cortisol which was normal. US showed some sludge in the GB but no stones, and some slight fatty liver. She does not drink any alcohol. LFTs normal  She has had changes in her seizure medications which did not make any significant difference.   Nausea has persisted which comes and goes. She doesn't vomit. She thinks she has lost 2 more lbs in the past month or so. She has a poor appetite. She is using the zofran PRN, which does help. She has not tried taking it scheduled. She is taking B12 supplement, fish oil, and vitamins. No herbal supplements. She denies marijuana use. She denies any abdominal pains today.   She has been on zoloft for a long time. She reports dosing of zoloft has fluctuated in the past year, seeing therapist currently for depression.    Past Medical History:  Diagnosis Date  . ALLERGIC RHINITIS 05/27/2007   uses Flonase daily as needed and ALbuterol inhaler prn;gets Allergy shots  . Allergy   . Anxiety and depression 12/19/2009  . Arthritis   . Cancer (HCC)    vaginal  . Constipation   . Depression   . Diverticulosis   . Environmental  allergies   . Essential hypertension, benign 06/26/2009  . GERD 05/27/2007   takes Nexium daily  . Heart murmur    yrs ago  . History of gout   . History of migraine many yrs ago  . Hyperlipidemia    takes Pravastatin daily  . Hypothyroidism   . Insomnia    but doesn't take any meds  . LOW BACK PAIN 05/27/2007  . Numbness    left toes   . Osteoporosis   . Seizure disorder [facility])    reports remote, takes Tegretol daily, followed by neurology  . Seizures ([place])    last 1983, none since then     Past Surgical History:  Procedure Laterality Date  . ADENOIDECTOMY    . BACK SURGERY     x2  . COLONOSCOPY    . ESOPHAGOGASTRODUODENOSCOPY    . Heel spurs Bilateral   . KNEE ARTHROPLASTY Left 11/07/2013   Procedure: COMPUTER ASSISTED TOTAL KNEE ARTHROPLASTY;  Surgeon: Marybelle Killings, MD;  Location: [place];  Service: Orthopedics;  Laterality: Left;  Left Total Knee Arthroplasty, Cemented, Computer Assist  . TONSILLECTOMY    . TOTAL KNEE REVISION Left 09/19/2014   Procedure: LEFT TOTAL KNEE REVISION;  Surgeon: Leandrew Koyanagi, MD;  Location: [place];  Service: Orthopedics;  Laterality: Left;   Family History  Problem Relation Age of Onset  . Arthritis Mother   .  Heart murmur Other   . Colon cancer Neg Hx   . Esophageal cancer Neg Hx   . Stomach cancer Neg Hx   . Rectal cancer Neg Hx    Social History  Substance Use Topics  . Smoking status: Former Smoker    Packs/day: 0.25    Years: 20.00    Types: Cigarettes    Quit date: 09/08/1978  . Smokeless tobacco: Never Used     Comment: quit smokikng 41yrs ago  . Alcohol use No   Current Outpatient Prescriptions  Medication Sig Dispense Refill  . albuterol (PROVENTIL HFA;VENTOLIN HFA) 108 (90 BASE) MCG/ACT inhaler Inhale 1 puff into the lungs every 6 (six) hours as needed for wheezing or shortness of breath.    Marland Kitchen aspirin 81 MG tablet Take 81 mg by mouth daily.    . beta carotene w/minerals (OCUVITE) tablet Take 1 tablet by mouth daily.    .  Cyanocobalamin (VITAMIN B 12 PO) Take by mouth daily.      Marland Kitchen esomeprazole (NEXIUM) 20 MG capsule Take 20 mg by mouth daily before breakfast.    . fluticasone (FLONASE) 50 MCG/ACT nasal spray Place 2 sprays into both nostrils daily. 16 g 6  . Levothyroxine Sodium 150 MCG CAPS Take 150 mcg by mouth daily.     Marland Kitchen lisinopril (PRINIVIL,ZESTRIL) 5 MG tablet Take 1 tablet (5 mg total) by mouth daily. 90 tablet 3  . Multiple Vitamin (MULTIVITAMIN) capsule Take 1 capsule by mouth daily.      . Omega-3 Fatty Acids (FISH OIL PO) Take by mouth daily.      . ondansetron (ZOFRAN) 4 MG tablet Take 1 tablet (4 mg total) by mouth every 8 (eight) hours as needed for nausea or vomiting. 30 tablet 1  . polyethylene glycol powder (GLYCOLAX/MIRALAX) powder Take 17 grams daily. 255 g 3  . pravastatin (PRAVACHOL) 20 MG tablet TAKE 1 TABLET(20 MG) BY MOUTH DAILY 90 tablet 1  . senna-docusate (SENOKOT S) 8.6-50 MG per tablet Take 1 tablet by mouth at bedtime as needed. 30 tablet 1  . sertraline (ZOLOFT) 100 MG tablet Take 1 tablet (100 mg total) by mouth daily. 90 tablet 1  . zonisamide (ZONEGRAN) 100 MG capsule Take 200 mg by mouth 2 (two) times daily.     No current facility-administered medications for this visit.    Allergies  Allergen Reactions  . Adhesive [Tape]     ?  Blister after knee surgery  . Clarithromycin     Unsure of reaction    . Doxycycline     Interfered with seizure medication  . Nickel     Had to remove knee implant with nickel and replace it  . Other     Metal and perfumes     Review of Systems: All systems reviewed and negative except where noted in HPI.    Ct Abdomen W Contrast  Result Date: 11/16/2015 CLINICAL DATA:  70 year old female with chronic left upper quadrant abdominal pain common nausea and weight loss. EXAM: CT ABDOMEN WITH CONTRAST TECHNIQUE: Multidetector CT imaging of the abdomen was performed using the standard protocol following bolus administration of intravenous  contrast. CONTRAST:  58mL ISOVUE-300 IOPAMIDOL (ISOVUE-300) INJECTION 61% COMPARISON:  No priors. FINDINGS: Lower chest: Scattered areas of mild scarring in the visualize lung bases. Heart size is mildly enlarged with mild left atrial dilatation. Atherosclerotic calcifications in the left anterior descending and right coronary arteries. Aortic atherosclerosis. Hepatobiliary: No cystic or solid hepatic lesions. No intra or  extrahepatic biliary ductal dilatation. Gallbladder is normal in appearance. Pancreas: No pancreatic mass. No pancreatic ductal dilatation. No pancreatic or peripancreatic fluid or inflammatory changes. Spleen: Unremarkable. Adrenals/Urinary Tract: Bilateral adrenal glands and bilateral kidneys are normal in appearance. No hydroureteronephrosis in the visualized abdomen. Stomach/Bowel: The stomach is normal in appearance. No pathologic dilatation of visualized small bowel or colon. Vascular/Lymphatic: Aortic atherosclerosis, without evidence of aneurysm or dissection in the abdominal vasculature. No lymphadenopathy noted in the abdomen. Other: No significant volume of ascites and no pneumoperitoneum in the visualized portions of the peritoneal cavity. Musculoskeletal: Postoperative changes of PLIF at L4-L5. There are no aggressive appearing lytic or blastic lesions noted in the visualized portions of the skeleton. IMPRESSION: 1. No acute findings in the abdomen to account for the patient's symptoms. 2. Aortic atherosclerosis, in addition to 2 vessel coronary artery disease. Please note that although the presence of coronary artery calcium documents the presence of coronary artery disease, the severity of this disease and any potential stenosis cannot be assessed on this non-gated CT examination. Assessment for potential risk factor modification, dietary therapy or pharmacologic therapy may be warranted, if clinically indicated. 3. Additional incidental findings, as above. Electronically Signed    By: Vinnie Langton M.D.   On: 11/16/2015 15:36  Mr Jeri Cos [id number] Contrast  Result Date: 12/07/2015 CLINICAL DATA:  Initial evaluation for persistent nausea and vomiting for greater than 1 year. History of epilepsy. EXAM: MRI HEAD WITHOUT AND WITH CONTRAST TECHNIQUE: Multiplanar, multiecho pulse sequences of the brain and surrounding structures were obtained without and with intravenous contrast. CONTRAST:  36mL MULTIHANCE GADOBENATE DIMEGLUMINE 529 MG/ML IV SOLN COMPARISON:  Prior MRI from 10/26/2014. FINDINGS: Cerebral volume within normal limits for patient age. No significant cerebral white matter disease. No abnormal foci of restricted diffusion to suggest acute or subacute ischemia. Gray-white matter differentiation well maintained. Major intracranial vascular flow voids are well preserved. No acute or chronic intracranial hemorrhage. No areas of chronic infarction. No mass lesion, midline shift, or mass effect. No hydrocephalus. No extra-axial fluid collection. Major dural sinuses are grossly patent. No abnormal enhancement. Craniocervical junction is normal. Partially visualized upper cervical spine unremarkable. Again noted is a small Rathke's cleft cyst within the pituitary gland, grossly similar measuring 7 mm on today's study. Pituitary otherwise unremarkable. No acute abnormality about the globes and orbits. Paranasal sinuses are largely clear. No mastoid effusion. Inner ear structures grossly normal. Bone marrow signal intensity within normal limits. No scalp soft tissue abnormality. IMPRESSION: 1. Normal brain MRI. 2. Stable incidental 7 mm Rathke's cleft cyst. Electronically Signed   By: Jeannine Boga M.D.   On: 12/07/2015 21:35   Nm Gastric Emptying  Result Date: 11/29/2015 CLINICAL DATA:  Nausea, early satiety for 1 year, LEFT side abdominal pain for several months EXAM: NUCLEAR MEDICINE GASTRIC EMPTYING SCAN TECHNIQUE: After oral ingestion of radiolabeled meal, sequential abdominal  images were obtained for 4 hours. Percentage of activity emptying the stomach was calculated at 1 hour, 2 hour, and 3 hours. Imaging was not continued to 4 hours since a normal value was identified at 3 hours. RADIOPHARMACEUTICALS:  2.1 mCi Tc-89m sulfur colloid labeled egg whites in a standardized meal COMPARISON:  None FINDINGS: Expected location of the stomach in the left upper quadrant. Ingested meal empties the stomach gradually over the course of the study. 63% emptied at 1 hr ( normal >= 10%) 82% emptied at 2 hr ( normal >= 40%) 94% emptied at 3 hr ( normal >= 70%)  N/A emptied at 4 hr ( normal >= 90%) IMPRESSION: Normal gastric emptying study. Electronically Signed   By: Lavonia Dana M.D.   On: 11/29/2015 13:09   Lab Results  Component Value Date   WBC 5.5 11/01/2015   HGB 13.7 11/01/2015   HCT 39.8 11/01/2015   MCV 93.6 11/01/2015   PLT 201.0 11/01/2015    Lab Results  Component Value Date   CREATININE 0.83 12/03/2015   BUN 11 12/03/2015   NA 138 10/05/2015   K 4.8 10/05/2015   CL 104 10/05/2015   CO2 26 10/05/2015    Lab Results  Component Value Date   ALT 13 10/05/2015   AST 19 10/05/2015   ALKPHOS 78 10/05/2015   BILITOT 0.3 10/05/2015      Physical Exam: BP 124/60 (BP Location: Left Arm, Patient Position: Sitting, Cuff Size: Normal)   Pulse 68   Ht 5\' 5"  (1.651 m)   Wt 201 lb 6 oz (91.3 kg)   BMI 33.51 kg/m  Constitutional: Pleasant,well-developed, female in no acute distress. HEENT: Normocephalic and atraumatic. Conjunctivae are normal. No scleral icterus. Neck supple.  Cardiovascular: Normal rate, regular rhythm.  Pulmonary/chest: Effort normal and breath sounds normal. No wheezing, rales or rhonchi. Abdominal: Soft, nondistended, nontender. Bowel sounds active throughout. There are no masses palpable.  Extremities: no edema Lymphadenopathy: No cervical adenopathy noted. Neurological: Alert and oriented to person place and time. Skin: Skin is warm and dry.  No rashes noted. Psychiatric: Normal mood and affect. Behavior is normal.   ASSESSMENT AND PLAN: 70 y/o female with ongoing persistent nausea without a clear etiology. Extensive evaluation as above with EGD, CT abdomen, gastric emptying study, MRI brain, and labs negative for a clear cause. She has significant symptoms that persist and bother her a great deal. She has severe depression and not sure if this is related. Otherwise it's possible she could be having nausea from medications. On review of her medications, Zoloft would be the most likely, drug insert reports nausea in up to 30% of patient's who take it. She reports she cannot lower the dose without worsening depression and asks about other options. She is seeing a therapist who told her they cannot manage her medications. In this light, will refer her to psychiatry to see if they can help taper her off zoloft and consider another form of depression therapy in hopes of providing benefit for her nausea. In the interim, recommend scheduled zofran twice daily and then PRN for symptoms. She can also try taking vitamin B6, 50mg  per day, to see if this helps, and trial of FD gard PRN with meals. She agreed with the plan and will f/u if symptoms persist despite changes in medications.   Casey Cellar, MD [facility] Pager [phone]  CC: Lucretia Kern, DO

## 2015-12-13 NOTE — Patient Instructions (Signed)
We have recommended a psychiatry referral, we will call you with that appointment.    We have provided you with FDgard samples.  We have refilled your zofran.  Please pick up at your pharmacy.

## 2015-12-18 DIAGNOSIS — J3089 Other allergic rhinitis: Secondary | ICD-10-CM | POA: Diagnosis not present

## 2015-12-18 DIAGNOSIS — J301 Allergic rhinitis due to pollen: Secondary | ICD-10-CM | POA: Diagnosis not present

## 2015-12-18 DIAGNOSIS — I10 Essential (primary) hypertension: Secondary | ICD-10-CM | POA: Diagnosis not present

## 2015-12-18 DIAGNOSIS — J3081 Allergic rhinitis due to animal (cat) (dog) hair and dander: Secondary | ICD-10-CM | POA: Diagnosis not present

## 2015-12-18 DIAGNOSIS — E0789 Other specified disorders of thyroid: Secondary | ICD-10-CM | POA: Diagnosis not present

## 2015-12-18 DIAGNOSIS — E789 Disorder of lipoprotein metabolism, unspecified: Secondary | ICD-10-CM | POA: Diagnosis not present

## 2015-12-18 LAB — TSH: TSH: 0.31 — AB (ref <=5.90)

## 2015-12-19 ENCOUNTER — Telehealth: Payer: Self-pay

## 2015-12-19 NOTE — Telephone Encounter (Signed)
Informed patient that I called [place] to try to get her referral to psychiatry and they did not have an appt in [facility] until December but did have appt available at satellite offices at other locations. Patient states she will probably call this other office that Dr. Glennon Hamilton recommended and maybe they can see her sooner than December. She asked if Dr. Havery Moros is ok with that. I informed her that he would be fine with that as long as she sees someone to adjust her medications. Patient states she will call that office tomorrow and for some reason needs [place] to make the referral to [facility] psychiatry then she will call us back.

## 2015-12-20 ENCOUNTER — Telehealth: Payer: Self-pay | Admitting: Gastroenterology

## 2015-12-20 NOTE — Telephone Encounter (Signed)
Patient states she has contacted [facility] to get an appt per recommendations from Dr. Glennon Hamilton. Patient states they are checking with her insurance and they are going to contact her back with an appointment set up with Dr. Donnal Moat. Patient just wanted to let our office know. I informed patient that if any reason she cannot get an appointment, she can call me back and I will contact [place] [facility] again. Patient verbalized understanding.

## 2015-12-25 DIAGNOSIS — R112 Nausea with vomiting, unspecified: Secondary | ICD-10-CM | POA: Diagnosis not present

## 2015-12-25 DIAGNOSIS — E032 Hypothyroidism due to medicaments and other exogenous substances: Secondary | ICD-10-CM | POA: Diagnosis not present

## 2015-12-25 DIAGNOSIS — E789 Disorder of lipoprotein metabolism, unspecified: Secondary | ICD-10-CM | POA: Diagnosis not present

## 2015-12-25 DIAGNOSIS — I10 Essential (primary) hypertension: Secondary | ICD-10-CM | POA: Diagnosis not present

## 2015-12-28 DIAGNOSIS — J3089 Other allergic rhinitis: Secondary | ICD-10-CM | POA: Diagnosis not present

## 2015-12-28 DIAGNOSIS — J301 Allergic rhinitis due to pollen: Secondary | ICD-10-CM | POA: Diagnosis not present

## 2015-12-28 DIAGNOSIS — J3081 Allergic rhinitis due to animal (cat) (dog) hair and dander: Secondary | ICD-10-CM | POA: Diagnosis not present

## 2016-01-03 ENCOUNTER — Ambulatory Visit (INDEPENDENT_AMBULATORY_CARE_PROVIDER_SITE_OTHER): Payer: Medicare HMO

## 2016-01-03 DIAGNOSIS — Z23 Encounter for immunization: Secondary | ICD-10-CM

## 2016-01-03 DIAGNOSIS — J3081 Allergic rhinitis due to animal (cat) (dog) hair and dander: Secondary | ICD-10-CM | POA: Diagnosis not present

## 2016-01-03 DIAGNOSIS — J301 Allergic rhinitis due to pollen: Secondary | ICD-10-CM | POA: Diagnosis not present

## 2016-01-03 DIAGNOSIS — J3089 Other allergic rhinitis: Secondary | ICD-10-CM | POA: Diagnosis not present

## 2016-01-04 ENCOUNTER — Other Ambulatory Visit: Payer: Self-pay | Admitting: Family Medicine

## 2016-01-10 ENCOUNTER — Ambulatory Visit (INDEPENDENT_AMBULATORY_CARE_PROVIDER_SITE_OTHER): Payer: Medicare HMO | Admitting: Psychology

## 2016-01-10 DIAGNOSIS — F411 Generalized anxiety disorder: Secondary | ICD-10-CM | POA: Diagnosis not present

## 2016-01-10 DIAGNOSIS — F331 Major depressive disorder, recurrent, moderate: Secondary | ICD-10-CM

## 2016-01-10 DIAGNOSIS — J301 Allergic rhinitis due to pollen: Secondary | ICD-10-CM | POA: Diagnosis not present

## 2016-01-10 DIAGNOSIS — J3089 Other allergic rhinitis: Secondary | ICD-10-CM | POA: Diagnosis not present

## 2016-01-10 DIAGNOSIS — J3081 Allergic rhinitis due to animal (cat) (dog) hair and dander: Secondary | ICD-10-CM | POA: Diagnosis not present

## 2016-01-15 DIAGNOSIS — J3089 Other allergic rhinitis: Secondary | ICD-10-CM | POA: Diagnosis not present

## 2016-01-15 DIAGNOSIS — J301 Allergic rhinitis due to pollen: Secondary | ICD-10-CM | POA: Diagnosis not present

## 2016-01-15 DIAGNOSIS — J3081 Allergic rhinitis due to animal (cat) (dog) hair and dander: Secondary | ICD-10-CM | POA: Diagnosis not present

## 2016-01-18 DIAGNOSIS — J301 Allergic rhinitis due to pollen: Secondary | ICD-10-CM | POA: Diagnosis not present

## 2016-01-29 ENCOUNTER — Telehealth: Payer: Self-pay

## 2016-01-29 NOTE — Telephone Encounter (Signed)
LEFT MESSAGE FOR AWV W/SUSAN 01-29-16

## 2016-02-01 ENCOUNTER — Ambulatory Visit: Payer: Self-pay | Admitting: Family Medicine

## 2016-02-12 ENCOUNTER — Ambulatory Visit: Payer: Self-pay | Admitting: Family Medicine

## 2016-02-17 NOTE — Progress Notes (Signed)
HPI:  Follow up:  HTN/HLD/Obesity: -meds:lisinopril 5, pravastatin 20, asa -wt 225 02/2015 --> 203 last visit --> today 197 -lifestyle: has improved diet, not exercising -no symptoms  Aortic atherosclerosis/CAD: -incidental findings on CT scan -she is really working on diet; no regular exercise -father had heart attach in his 33s -no CP, DOE, sob, swelling, palpitations -on asa, lisinopril, statin  GERD/chronic nausea/constipation: -s/p eval with GI in 2017 -meds: nexium 20, zofran prn, senokot  Asthma and allergies: -meds: alb prn, flonase  Hypothyroid: -meds: levothyroxine - reports saw her endocrinologist and he lowered the dose -sees endo, Dr. Wilson Singer - forwarded mildly elevated TSH last visit  Depression: -meds: zoloft -saw Dr. Glennon Hamilton for some counseling in the past - feels doctor Glennon Hamilton has really helped her with coping with mother, stress from husband and feels much better -will be seeing a psychiatrist  Seizure Disorder: -sees Dr. Sheppard Evens, neurology, [facility] -tegratol caused hyponatremia, now on zonisamide  ROS: See pertinent positives and negatives per HPI.  Past Medical History:  Diagnosis Date  . ALLERGIC RHINITIS 05/27/2007   uses Flonase daily as needed and ALbuterol inhaler prn;gets Allergy shots  . Allergy   . Anxiety and depression 12/19/2009  . Arthritis   . Cancer (HCC)    vaginal  . Constipation   . Depression   . Diverticulosis   . Environmental allergies   . Essential hypertension, benign 06/26/2009  . GERD 05/27/2007   takes Nexium daily  . Heart murmur    yrs ago  . History of gout   . History of migraine many yrs ago  . Hyperlipidemia    takes Pravastatin daily  . Hypothyroidism   . Insomnia    but doesn't take any meds  . LOW BACK PAIN 05/27/2007  . Numbness    left toes   . Osteoporosis   . Seizure disorder [facility])    reports remote, takes Tegretol daily, followed by neurology  . Seizures ([place])    last 1983, none since then     Past Surgical History:  Procedure Laterality Date  . ADENOIDECTOMY    . BACK SURGERY     x2  . COLONOSCOPY    . ESOPHAGOGASTRODUODENOSCOPY    . Heel spurs Bilateral   . KNEE ARTHROPLASTY Left 11/07/2013   Procedure: COMPUTER ASSISTED TOTAL KNEE ARTHROPLASTY;  Surgeon: Marybelle Killings, MD;  Location: [place];  Service: Orthopedics;  Laterality: Left;  Left Total Knee Arthroplasty, Cemented, Computer Assist  . TONSILLECTOMY    . TOTAL KNEE REVISION Left 09/19/2014   Procedure: LEFT TOTAL KNEE REVISION;  Surgeon: Leandrew Koyanagi, MD;  Location: [facility];  Service: Orthopedics;  Laterality: Left;    Family History  Problem Relation Age of Onset  . Arthritis Mother   . Heart murmur Other   . Colon cancer Neg Hx   . Esophageal cancer Neg Hx   . Stomach cancer Neg Hx   . Rectal cancer Neg Hx     Social History   Social History  . Marital status: Married    Spouse name: N/A  . Number of children: 1  . Years of education: N/A   Occupational History  . Retired    Social History Main Topics  . Smoking status: Former Smoker    Packs/day: 0.25    Years: 20.00    Types: Cigarettes    Quit date: 09/08/1978  . Smokeless tobacco: Never Used     Comment: quit smokikng 80yrs ago  . Alcohol use  No  . Drug use: No  . Sexual activity: No   Other Topics Concern  . None   Social History Narrative  . None     Current Outpatient Prescriptions:  .  albuterol (PROVENTIL HFA;VENTOLIN HFA) 108 (90 BASE) MCG/ACT inhaler, Inhale 1 puff into the lungs every 6 (six) hours as needed for wheezing or shortness of breath., Disp: , Rfl:  .  aspirin 81 MG tablet, Take 81 mg by mouth daily., Disp: , Rfl:  .  beta carotene w/minerals (OCUVITE) tablet, Take 1 tablet by mouth daily., Disp: , Rfl:  .  Cyanocobalamin (VITAMIN B 12 PO), Take by mouth daily.  , Disp: , Rfl:  .  esomeprazole (NEXIUM) 20 MG capsule, Take 20 mg by mouth daily before breakfast., Disp: , Rfl:  .  fluticasone (FLONASE) 50 MCG/ACT  nasal spray, Place 2 sprays into both nostrils daily., Disp: 16 g, Rfl: 6 .  Levothyroxine Sodium 150 MCG CAPS, Take 150 mcg by mouth daily. , Disp: , Rfl:  .  lisinopril (PRINIVIL,ZESTRIL) 5 MG tablet, Take 1 tablet (5 mg total) by mouth daily., Disp: 90 tablet, Rfl: 3 .  Multiple Vitamin (MULTIVITAMIN) capsule, Take 1 capsule by mouth daily.  , Disp: , Rfl:  .  Omega-3 Fatty Acids (FISH OIL PO), Take by mouth daily.  , Disp: , Rfl:  .  ondansetron (ZOFRAN) 4 MG tablet, Take 1 tablet (4 mg total) by mouth every 8 (eight) hours as needed for nausea or vomiting., Disp: 30 tablet, Rfl: 1 .  polyethylene glycol powder (GLYCOLAX/MIRALAX) powder, Take 17 grams daily., Disp: 255 g, Rfl: 3 .  pravastatin (PRAVACHOL) 20 MG tablet, TAKE 1 TABLET(20 MG) BY MOUTH DAILY, Disp: 90 tablet, Rfl: 1 .  pravastatin (PRAVACHOL) 20 MG tablet, TAKE 1 TABLET(20 MG) BY MOUTH DAILY, Disp: 90 tablet, Rfl: 2 .  senna-docusate (SENOKOT S) 8.6-50 MG per tablet, Take 1 tablet by mouth at bedtime as needed., Disp: 30 tablet, Rfl: 1 .  sertraline (ZOLOFT) 100 MG tablet, Take 1 tablet (100 mg total) by mouth daily., Disp: 90 tablet, Rfl: 1 .  zonisamide (ZONEGRAN) 100 MG capsule, Take 200 mg by mouth 2 (two) times daily., Disp: , Rfl:   EXAM:  Vitals:   02/18/16 0954  BP: 118/64  Pulse: 81  Temp: 98.1 F (36.7 C)   Filed Weights   02/18/16 0954  Weight: 197 lb 11.2 oz (89.7 kg)   Body mass index is 32.9 kg/m.  GENERAL: vitals reviewed and listed above, alert, oriented, appears well hydrated and in no acute distress  HEENT: atraumatic, conjunttiva clear, no obvious abnormalities on inspection of external nose and ears  NECK: no obvious masses on inspection  LUNGS: clear to auscultation bilaterally, no wheezes, rales or rhonchi, good air movement  CV: HRRR, no peripheral edema  MS: moves all extremities without noticeable abnormality  PSYCH: pleasant and cooperative, no obvious depression or  anxiety  ASSESSMENT AND PLAN:  Discussed the following assessment and plan:  Essential hypertension  Hypothyroidism, unspecified type  Class 1 obesity due to excess calories with body mass index (BMI) of 32.0 to 32.9 in adult, unspecified whether serious comorbidity present  Hyperlipidemia, unspecified hyperlipidemia type  Aortic atherosclerosis (HCC)  Recurrent major depressive disorder, in full remission ([place])  -lifestyle recs, increased exercise advised along with continued healthy diet -discussed trial elimination diet for the chronic nausea, she also is seeing psych to consider changing medication -mammogram advised - she reports is scheduled along with  gyn exam in the next 1 month -Patient advised to return or notify a doctor immediately if symptoms worsen or persist or new concerns arise.  Patient Instructions  BEFORE YOU LEAVE: -follow up: 3-4 months  Please get you mammogram as planned as soon as possible.  I am glad you are feeling better.  Work on increasing exercise - goal is 150 minutes of aerobic exercise per week.  Keep up with a healthier diet! You can consider an elimination diet to see if it helps the stomach issues. Start with dairy for 2 weeks, then gluten for two weeks and so on.  We recommend the following healthy lifestyle for LIFE: 1) Small portions.   Tip: eat off of a salad plate instead of a dinner plate.  Tip: It is ok to feel hungry after a meal - that likely means you ate an appropriate portion.  Tip: if you need more or a snack choose fruits, veggies and/or a handful of nuts or seeds.  2) Eat a healthy clean diet.  * Tip: Avoid (less then 1 serving per week): processed foods, sweets, sweetened drinks, white starches (rice, flour, bread, potatoes, pasta, etc), red meat, fast foods, butter  *Tip: CHOOSE instead   * 5-9 servings per day of fresh or frozen fruits and vegetables (but not corn, potatoes, bananas, canned or dried fruit)   *nuts and  seeds, beans   *olives and olive oil   *small portions of lean meats such as fish and white chicken    *small portions of whole grains  3)Get at least 150 minutes of sweaty aerobic exercise per week.  4)Reduce stress - consider counseling, meditation and relaxation to balance other aspects of your life.     Colin Benton R., DO

## 2016-02-18 ENCOUNTER — Encounter: Payer: Self-pay | Admitting: Family Medicine

## 2016-02-18 ENCOUNTER — Ambulatory Visit (INDEPENDENT_AMBULATORY_CARE_PROVIDER_SITE_OTHER): Payer: Medicare HMO | Admitting: Family Medicine

## 2016-02-18 VITALS — BP 118/64 | HR 81 | Temp 98.1°F | Ht 65.0 in | Wt 197.7 lb

## 2016-02-18 DIAGNOSIS — E785 Hyperlipidemia, unspecified: Secondary | ICD-10-CM

## 2016-02-18 DIAGNOSIS — F3342 Major depressive disorder, recurrent, in full remission: Secondary | ICD-10-CM

## 2016-02-18 DIAGNOSIS — J301 Allergic rhinitis due to pollen: Secondary | ICD-10-CM | POA: Diagnosis not present

## 2016-02-18 DIAGNOSIS — I10 Essential (primary) hypertension: Secondary | ICD-10-CM

## 2016-02-18 DIAGNOSIS — I700 Atherosclerosis of aorta: Secondary | ICD-10-CM

## 2016-02-18 DIAGNOSIS — E039 Hypothyroidism, unspecified: Secondary | ICD-10-CM

## 2016-02-18 DIAGNOSIS — Z6832 Body mass index (BMI) 32.0-32.9, adult: Secondary | ICD-10-CM

## 2016-02-18 DIAGNOSIS — J3089 Other allergic rhinitis: Secondary | ICD-10-CM | POA: Diagnosis not present

## 2016-02-18 DIAGNOSIS — E6609 Other obesity due to excess calories: Secondary | ICD-10-CM | POA: Diagnosis not present

## 2016-02-18 DIAGNOSIS — J3081 Allergic rhinitis due to animal (cat) (dog) hair and dander: Secondary | ICD-10-CM | POA: Diagnosis not present

## 2016-02-18 NOTE — Progress Notes (Signed)
Pre visit review using our clinic review tool, if applicable. No additional management support is needed unless otherwise documented below in the visit note. 

## 2016-02-18 NOTE — Patient Instructions (Addendum)
BEFORE YOU LEAVE: -follow up: 3-4 months  Please get you mammogram as planned as soon as possible.  I am glad you are feeling better.  Work on increasing exercise - goal is 150 minutes of aerobic exercise per week.  Keep up with a healthier diet! You can consider an elimination diet to see if it helps the stomach issues. Start with dairy for 2 weeks, then gluten for two weeks and so on.  We recommend the following healthy lifestyle for LIFE: 1) Small portions.   Tip: eat off of a salad plate instead of a dinner plate.  Tip: It is ok to feel hungry after a meal - that likely means you ate an appropriate portion.  Tip: if you need more or a snack choose fruits, veggies and/or a handful of nuts or seeds.  2) Eat a healthy clean diet.  * Tip: Avoid (less then 1 serving per week): processed foods, sweets, sweetened drinks, white starches (rice, flour, bread, potatoes, pasta, etc), red meat, fast foods, butter  *Tip: CHOOSE instead   * 5-9 servings per day of fresh or frozen fruits and vegetables (but not corn, potatoes, bananas, canned or dried fruit)   *nuts and seeds, beans   *olives and olive oil   *small portions of lean meats such as fish and white chicken    *small portions of whole grains  3)Get at least 150 minutes of sweaty aerobic exercise per week.  4)Reduce stress - consider counseling, meditation and relaxation to balance other aspects of your life.

## 2016-02-20 DIAGNOSIS — J3089 Other allergic rhinitis: Secondary | ICD-10-CM | POA: Diagnosis not present

## 2016-02-20 DIAGNOSIS — H1045 Other chronic allergic conjunctivitis: Secondary | ICD-10-CM | POA: Diagnosis not present

## 2016-02-20 DIAGNOSIS — J301 Allergic rhinitis due to pollen: Secondary | ICD-10-CM | POA: Diagnosis not present

## 2016-02-20 DIAGNOSIS — R05 Cough: Secondary | ICD-10-CM | POA: Diagnosis not present

## 2016-02-25 ENCOUNTER — Ambulatory Visit (INDEPENDENT_AMBULATORY_CARE_PROVIDER_SITE_OTHER): Payer: Medicare HMO | Admitting: Psychology

## 2016-02-25 DIAGNOSIS — F411 Generalized anxiety disorder: Secondary | ICD-10-CM

## 2016-02-25 DIAGNOSIS — J3089 Other allergic rhinitis: Secondary | ICD-10-CM | POA: Diagnosis not present

## 2016-02-25 DIAGNOSIS — J3081 Allergic rhinitis due to animal (cat) (dog) hair and dander: Secondary | ICD-10-CM | POA: Diagnosis not present

## 2016-02-25 DIAGNOSIS — F331 Major depressive disorder, recurrent, moderate: Secondary | ICD-10-CM | POA: Diagnosis not present

## 2016-02-25 DIAGNOSIS — J301 Allergic rhinitis due to pollen: Secondary | ICD-10-CM | POA: Diagnosis not present

## 2016-03-05 DIAGNOSIS — J301 Allergic rhinitis due to pollen: Secondary | ICD-10-CM | POA: Diagnosis not present

## 2016-03-05 DIAGNOSIS — J3081 Allergic rhinitis due to animal (cat) (dog) hair and dander: Secondary | ICD-10-CM | POA: Diagnosis not present

## 2016-03-05 DIAGNOSIS — J3089 Other allergic rhinitis: Secondary | ICD-10-CM | POA: Diagnosis not present

## 2016-03-06 DIAGNOSIS — G40209 Localization-related (focal) (partial) symptomatic epilepsy and epileptic syndromes with complex partial seizures, not intractable, without status epilepticus: Secondary | ICD-10-CM | POA: Diagnosis not present

## 2016-03-06 DIAGNOSIS — J3089 Other allergic rhinitis: Secondary | ICD-10-CM | POA: Diagnosis not present

## 2016-03-06 DIAGNOSIS — J3081 Allergic rhinitis due to animal (cat) (dog) hair and dander: Secondary | ICD-10-CM | POA: Diagnosis not present

## 2016-03-06 DIAGNOSIS — G43009 Migraine without aura, not intractable, without status migrainosus: Secondary | ICD-10-CM | POA: Diagnosis not present

## 2016-03-10 DIAGNOSIS — J301 Allergic rhinitis due to pollen: Secondary | ICD-10-CM | POA: Diagnosis not present

## 2016-03-10 DIAGNOSIS — J3089 Other allergic rhinitis: Secondary | ICD-10-CM | POA: Diagnosis not present

## 2016-03-10 DIAGNOSIS — J3081 Allergic rhinitis due to animal (cat) (dog) hair and dander: Secondary | ICD-10-CM | POA: Diagnosis not present

## 2016-03-11 ENCOUNTER — Other Ambulatory Visit: Payer: Self-pay | Admitting: Gastroenterology

## 2016-03-20 DIAGNOSIS — Z01419 Encounter for gynecological examination (general) (routine) without abnormal findings: Secondary | ICD-10-CM | POA: Diagnosis not present

## 2016-03-20 DIAGNOSIS — Z1231 Encounter for screening mammogram for malignant neoplasm of breast: Secondary | ICD-10-CM | POA: Diagnosis not present

## 2016-03-20 DIAGNOSIS — Z124 Encounter for screening for malignant neoplasm of cervix: Secondary | ICD-10-CM | POA: Diagnosis not present

## 2016-03-28 ENCOUNTER — Other Ambulatory Visit: Payer: Self-pay | Admitting: Obstetrics & Gynecology

## 2016-03-28 DIAGNOSIS — E2839 Other primary ovarian failure: Secondary | ICD-10-CM

## 2016-04-01 DIAGNOSIS — J3089 Other allergic rhinitis: Secondary | ICD-10-CM | POA: Diagnosis not present

## 2016-04-01 DIAGNOSIS — J3081 Allergic rhinitis due to animal (cat) (dog) hair and dander: Secondary | ICD-10-CM | POA: Diagnosis not present

## 2016-04-01 DIAGNOSIS — J301 Allergic rhinitis due to pollen: Secondary | ICD-10-CM | POA: Diagnosis not present

## 2016-04-04 ENCOUNTER — Ambulatory Visit
Admission: RE | Admit: 2016-04-04 | Discharge: 2016-04-04 | Disposition: A | Discharge status: Home / Self Care | Payer: Medicare HMO | Source: Ambulatory Visit | Attending: Obstetrics & Gynecology | Admitting: Obstetrics & Gynecology

## 2016-04-04 ENCOUNTER — Other Ambulatory Visit: Payer: Self-pay

## 2016-04-04 DIAGNOSIS — M8589 Other specified disorders of bone density and structure, multiple sites: Secondary | ICD-10-CM | POA: Diagnosis not present

## 2016-04-04 DIAGNOSIS — Z780 Asymptomatic menopausal state: Secondary | ICD-10-CM | POA: Diagnosis not present

## 2016-04-04 DIAGNOSIS — E2839 Other primary ovarian failure: Secondary | ICD-10-CM

## 2016-04-09 DIAGNOSIS — J301 Allergic rhinitis due to pollen: Secondary | ICD-10-CM | POA: Diagnosis not present

## 2016-04-09 DIAGNOSIS — J3089 Other allergic rhinitis: Secondary | ICD-10-CM | POA: Diagnosis not present

## 2016-04-09 DIAGNOSIS — J3081 Allergic rhinitis due to animal (cat) (dog) hair and dander: Secondary | ICD-10-CM | POA: Diagnosis not present

## 2016-04-10 ENCOUNTER — Ambulatory Visit (INDEPENDENT_AMBULATORY_CARE_PROVIDER_SITE_OTHER): Payer: Medicare HMO | Admitting: Family Medicine

## 2016-04-10 ENCOUNTER — Encounter: Payer: Self-pay | Admitting: Family Medicine

## 2016-04-10 VITALS — BP 110/58 | HR 69 | Temp 98.1°F | Ht 65.0 in | Wt 197.8 lb

## 2016-04-10 DIAGNOSIS — M25512 Pain in left shoulder: Secondary | ICD-10-CM

## 2016-04-10 MED ORDER — CYCLOBENZAPRINE HCL 5 MG PO TABS: 5.0000 mg | ORAL_TABLET | Freq: Every evening | ORAL | 0 refills | Status: DC | PRN

## 2016-04-10 NOTE — Progress Notes (Signed)
Pre visit review using our clinic review tool, if applicable. No additional management support is needed unless otherwise documented below in the visit note. 

## 2016-04-10 NOTE — Progress Notes (Signed)
HPI:  L lateral shoulder pain: -for 1-2 months -worse with abd above 90 degrees, fixing hair, lifting things about her head and certain positions at night -no weakness, numbness, radiation, malaise, CP, jaw pain or fevers or known trauma  ROS: See pertinent positives and negatives per HPI.  Past Medical History:  Diagnosis Date  . ALLERGIC RHINITIS 05/27/2007   uses Flonase daily as needed and ALbuterol inhaler prn;gets Allergy shots  . Allergy   . Anxiety and depression 12/19/2009  . Arthritis   . Cancer (HCC)    vaginal  . Constipation   . Depression   . Diverticulosis   . Environmental allergies   . Essential hypertension, benign 06/26/2009  . GERD 05/27/2007   takes Nexium daily  . Heart murmur    yrs ago  . History of gout   . History of migraine many yrs ago  . Hyperlipidemia    takes Pravastatin daily  . Hypothyroidism   . Insomnia    but doesn't take any meds  . LOW BACK PAIN 05/27/2007  . Numbness    left toes   . Osteoporosis   . Seizure disorder [facility])    reports remote, takes Tegretol daily, followed by neurology  . Seizures ([place])    last 1983, none since then    Past Surgical History:  Procedure Laterality Date  . ADENOIDECTOMY    . BACK SURGERY     x2  . COLONOSCOPY    . ESOPHAGOGASTRODUODENOSCOPY    . Heel spurs Bilateral   . KNEE ARTHROPLASTY Left 11/07/2013   Procedure: COMPUTER ASSISTED TOTAL KNEE ARTHROPLASTY;  Surgeon: Marybelle Killings, MD;  Location: [place];  Service: Orthopedics;  Laterality: Left;  Left Total Knee Arthroplasty, Cemented, Computer Assist  . TONSILLECTOMY    . TOTAL KNEE REVISION Left 09/19/2014   Procedure: LEFT TOTAL KNEE REVISION;  Surgeon: Leandrew Koyanagi, MD;  Location: [place];  Service: Orthopedics;  Laterality: Left;    Family History  Problem Relation Age of Onset  . Arthritis Mother   . Heart murmur Other   . Colon cancer Neg Hx   . Esophageal cancer Neg Hx   . Stomach cancer Neg Hx   . Rectal cancer Neg Hx     Social  History   Social History  . Marital status: Married    Spouse name: N/A  . Number of children: 1  . Years of education: N/A   Occupational History  . Retired    Social History Main Topics  . Smoking status: Former Smoker    Packs/day: 0.25    Years: 20.00    Types: Cigarettes    Quit date: 09/08/1978  . Smokeless tobacco: Never Used     Comment: quit smokikng 78yrs ago  . Alcohol use No  . Drug use: No  . Sexual activity: No   Other Topics Concern  . None   Social History Narrative  . None     Current Outpatient Prescriptions:  .  albuterol (PROVENTIL HFA;VENTOLIN HFA) 108 (90 BASE) MCG/ACT inhaler, Inhale 1 puff into the lungs every 6 (six) hours as needed for wheezing or shortness of breath., Disp: , Rfl:  .  aspirin 81 MG tablet, Take 81 mg by mouth daily., Disp: , Rfl:  .  beta carotene w/minerals (OCUVITE) tablet, Take 1 tablet by mouth daily., Disp: , Rfl:  .  Cyanocobalamin (VITAMIN B 12 PO), Take by mouth daily.  , Disp: , Rfl:  .  esomeprazole ([place])  20 MG capsule, Take 20 mg by mouth daily before breakfast., Disp: , Rfl:  .  fluticasone (FLONASE) 50 MCG/ACT nasal spray, Place 2 sprays into both nostrils daily., Disp: 16 g, Rfl: 6 .  Levothyroxine Sodium 150 MCG CAPS, Take 150 mcg by mouth daily. , Disp: , Rfl:  .  lisinopril (PRINIVIL,ZESTRIL) 5 MG tablet, Take 1 tablet (5 mg total) by mouth daily., Disp: 90 tablet, Rfl: 3 .  Multiple Vitamin (MULTIVITAMIN) capsule, Take 1 capsule by mouth daily.  , Disp: , Rfl:  .  Omega-3 Fatty Acids (FISH OIL PO), Take by mouth daily.  , Disp: , Rfl:  .  ondansetron (ZOFRAN) 4 MG tablet, Take 1 tablet (4 mg total) by mouth every 8 (eight) hours as needed for nausea or vomiting., Disp: 30 tablet, Rfl: 1 .  polyethylene glycol powder (GLYCOLAX/MIRALAX) powder, MIX 17 GRAMS DAILY AS DIRECTED, Disp: 255 g, Rfl: 0 .  pravastatin (PRAVACHOL) 20 MG tablet, TAKE 1 TABLET(20 MG) BY MOUTH DAILY, Disp: 90 tablet, Rfl: 1 .   senna-docusate (SENOKOT S) 8.6-50 MG per tablet, Take 1 tablet by mouth at bedtime as needed., Disp: 30 tablet, Rfl: 1 .  sertraline (ZOLOFT) 100 MG tablet, Take 1 tablet (100 mg total) by mouth daily. (Patient taking differently: Take 125 mg by mouth daily. ), Disp: 90 tablet, Rfl: 1 .  zonisamide (ZONEGRAN) 100 MG capsule, Take 200 mg by mouth 2 (two) times daily., Disp: , Rfl:  .  cyclobenzaprine (FLEXERIL) 5 MG tablet, Take 1 tablet (5 mg total) by mouth at bedtime as needed for muscle spasms., Disp: 30 tablet, Rfl: 0  EXAM:  Vitals:   04/10/16 1711  BP: (!) 110/58  Pulse: 69  Temp: 98.1 F (36.7 C)    Body mass index is 32.92 kg/m.  GENERAL: vitals reviewed and listed above, alert, oriented, appears well hydrated and in no acute distress  HEENT: atraumatic, conjunttiva clear, no obvious abnormalities on inspection of external nose and ears  NECK: no obvious masses on inspection  LUNGS: clear to auscultation bilaterally, no wheezes, rales or rhonchi, good air movement  CV: HRRR, no peripheral edema  MS: moves all extremities without noticeable abnormality, TTP rotator cuff attachments to humerus on L, also supraspinatus muscle and mid traps L, + impingemnet sign L, neg empty can, normal strength in int/ext rot shoulder, abd, ext, flexion, NV intact distal  PSYCH: pleasant and cooperative, no obvious depression or anxiety  ASSESSMENT AND PLAN:  Discussed the following assessment and plan:  Left shoulder pain, unspecified chronicity  -likely RTC tendinopathy with some compensatory upper back muscle spasm -opted for HEP, aleve prn for pain, muscle relaxer -RTC in 1 month -Patient advised to return or notify a doctor immediately if symptoms worsen or persist or new concerns arise.  Patient Instructions  BEFORE YOU LEAVE: -rotator cuff exercises -follow up: 1 month  Do the exercises 4 days per week.  Aleve 1-2 tablets, 1-2 times daily as needed for pain.  Flexeril  if needed at night before bed.  Heat for 15 minutes twice daily.    Colin Benton R., DO

## 2016-04-10 NOTE — Patient Instructions (Signed)
BEFORE YOU LEAVE: -rotator cuff exercises -follow up: 1 month  Do the exercises 4 days per week.  Aleve 1-2 tablets, 1-2 times daily as needed for pain.  Flexeril if needed at night before bed.  Heat for 15 minutes twice daily.

## 2016-04-11 DIAGNOSIS — J301 Allergic rhinitis due to pollen: Secondary | ICD-10-CM | POA: Diagnosis not present

## 2016-04-11 DIAGNOSIS — J3089 Other allergic rhinitis: Secondary | ICD-10-CM | POA: Diagnosis not present

## 2016-04-11 DIAGNOSIS — J3081 Allergic rhinitis due to animal (cat) (dog) hair and dander: Secondary | ICD-10-CM | POA: Diagnosis not present

## 2016-04-15 ENCOUNTER — Telehealth: Payer: Self-pay

## 2016-04-15 DIAGNOSIS — J3081 Allergic rhinitis due to animal (cat) (dog) hair and dander: Secondary | ICD-10-CM | POA: Diagnosis not present

## 2016-04-15 DIAGNOSIS — J3089 Other allergic rhinitis: Secondary | ICD-10-CM | POA: Diagnosis not present

## 2016-04-15 DIAGNOSIS — J301 Allergic rhinitis due to pollen: Secondary | ICD-10-CM | POA: Diagnosis not present

## 2016-04-15 NOTE — Telephone Encounter (Signed)
Received PA request from [facility] for Cyclobenzaprine HCL. PA submitted & is pending. Key: [id number]

## 2016-04-16 NOTE — Telephone Encounter (Signed)
PA approved, form faxed back to pharmacy. 

## 2016-04-18 DIAGNOSIS — J3081 Allergic rhinitis due to animal (cat) (dog) hair and dander: Secondary | ICD-10-CM | POA: Diagnosis not present

## 2016-04-18 DIAGNOSIS — J301 Allergic rhinitis due to pollen: Secondary | ICD-10-CM | POA: Diagnosis not present

## 2016-04-18 DIAGNOSIS — J3089 Other allergic rhinitis: Secondary | ICD-10-CM | POA: Diagnosis not present

## 2016-04-24 DIAGNOSIS — J3081 Allergic rhinitis due to animal (cat) (dog) hair and dander: Secondary | ICD-10-CM | POA: Diagnosis not present

## 2016-04-24 DIAGNOSIS — E032 Hypothyroidism due to medicaments and other exogenous substances: Secondary | ICD-10-CM | POA: Diagnosis not present

## 2016-04-24 DIAGNOSIS — J3089 Other allergic rhinitis: Secondary | ICD-10-CM | POA: Diagnosis not present

## 2016-04-24 DIAGNOSIS — J301 Allergic rhinitis due to pollen: Secondary | ICD-10-CM | POA: Diagnosis not present

## 2016-04-24 LAB — TSH: TSH: 1.11 (ref <=5.90)

## 2016-04-29 DIAGNOSIS — J3081 Allergic rhinitis due to animal (cat) (dog) hair and dander: Secondary | ICD-10-CM | POA: Diagnosis not present

## 2016-04-29 DIAGNOSIS — J3089 Other allergic rhinitis: Secondary | ICD-10-CM | POA: Diagnosis not present

## 2016-04-29 DIAGNOSIS — J301 Allergic rhinitis due to pollen: Secondary | ICD-10-CM | POA: Diagnosis not present

## 2016-04-29 DIAGNOSIS — R69 Illness, unspecified: Secondary | ICD-10-CM | POA: Diagnosis not present

## 2016-05-01 DIAGNOSIS — E032 Hypothyroidism due to medicaments and other exogenous substances: Secondary | ICD-10-CM | POA: Diagnosis not present

## 2016-05-01 DIAGNOSIS — E789 Disorder of lipoprotein metabolism, unspecified: Secondary | ICD-10-CM | POA: Diagnosis not present

## 2016-05-12 ENCOUNTER — Ambulatory Visit: Payer: Self-pay | Admitting: Family Medicine

## 2016-05-16 DIAGNOSIS — J3081 Allergic rhinitis due to animal (cat) (dog) hair and dander: Secondary | ICD-10-CM | POA: Diagnosis not present

## 2016-05-16 DIAGNOSIS — J3089 Other allergic rhinitis: Secondary | ICD-10-CM | POA: Diagnosis not present

## 2016-05-16 DIAGNOSIS — J301 Allergic rhinitis due to pollen: Secondary | ICD-10-CM | POA: Diagnosis not present

## 2016-05-29 DIAGNOSIS — J301 Allergic rhinitis due to pollen: Secondary | ICD-10-CM | POA: Diagnosis not present

## 2016-05-29 DIAGNOSIS — J3089 Other allergic rhinitis: Secondary | ICD-10-CM | POA: Diagnosis not present

## 2016-05-29 DIAGNOSIS — J3081 Allergic rhinitis due to animal (cat) (dog) hair and dander: Secondary | ICD-10-CM | POA: Diagnosis not present

## 2016-06-05 ENCOUNTER — Ambulatory Visit (INDEPENDENT_AMBULATORY_CARE_PROVIDER_SITE_OTHER): Payer: Medicare HMO | Admitting: Orthopaedic Surgery

## 2016-06-05 DIAGNOSIS — G8929 Other chronic pain: Secondary | ICD-10-CM

## 2016-06-05 DIAGNOSIS — M25562 Pain in left knee: Secondary | ICD-10-CM | POA: Diagnosis not present

## 2016-06-05 MED ORDER — BUPIVACAINE HCL 0.5 % IJ SOLN
3.0000 mL | INTRAMUSCULAR | Status: AC | PRN
  Administered 2016-06-05: 3 mL via INTRA_ARTICULAR

## 2016-06-05 MED ORDER — METHYLPREDNISOLONE ACETATE 40 MG/ML IJ SUSP
40.0000 mg | INTRAMUSCULAR | Status: AC | PRN
  Administered 2016-06-05: 40 mg via INTRA_ARTICULAR

## 2016-06-05 MED ORDER — LIDOCAINE HCL 1 % IJ SOLN
3.0000 mL | INTRAMUSCULAR | Status: AC | PRN
  Administered 2016-06-05: 3 mL

## 2016-06-05 NOTE — Progress Notes (Signed)
Office Visit Note   Patient: Beverly Ballard           Date of Birth: 01/10/1946           MRN: [id number] Visit Date: 06/05/2016              Requested by: Lucretia Kern, DO [address] [place], [place] [address] PCP: Lucretia Kern., DO   Assessment & Plan: Visit Diagnoses:  1. Chronic pain of left knee     Plan: subacromial injection given today.  HEP provided.  F/u 4 weeks for recheck.  Follow-Up Instructions: Return in about 4 weeks (around 07/03/2016).    Procedures: Large Joint Inj Date/Time: 06/05/2016 9:23 AM Performed by: Leandrew Koyanagi Authorized by: Leandrew Koyanagi   Consent Given by:  Patient Timeout: prior to procedure the correct patient, procedure, and site was verified   Location:  Shoulder Site:  L subacromial bursa Prep: patient was prepped and draped in usual sterile fashion   Needle Size:  22 G Approach:  Posterior Ultrasound Guidance: No   Fluoroscopic Guidance: No   Arthrogram: No   Medications:  3 mL lidocaine 1 %; 3 mL bupivacaine 0.5 %; 40 mg methylPREDNISolone acetate 40 MG/ML     Clinical Data: No additional findings.   Subjective: Chief Complaint  Patient presents with  . Left Shoulder - Pain    71 yo female with left shoulder pain for several months of insidious onset.  Pain is getting worse especially with use of left arm and radiates down to left hand occasionally.      Review of Systems  Constitutional: Negative.   HENT: Negative.   Eyes: Negative.   Respiratory: Negative.   Cardiovascular: Negative.   Endocrine: Negative.   Musculoskeletal: Negative.   Neurological: Negative.   Hematological: Negative.   Psychiatric/Behavioral: Negative.   All other systems reviewed and are negative.    Objective: Vital Signs: There were no vitals taken for this visit.  Physical Exam  Constitutional: She is oriented to person, place, and time. She appears well-developed and well-nourished.  Pulmonary/Chest: Effort normal.    Neurological: She is alert and oriented to person, place, and time.  Skin: Skin is warm. Capillary refill takes less than 2 seconds.  Psychiatric: She has a normal mood and affect. Her behavior is normal. Judgment and thought content normal.  Nursing note and vitals reviewed.   Ortho Exam Left shoulder exam - positive impingement - positive empty can - neg drop arm  Specialty Comments:  No specialty comments available.  Imaging: No results found.   PMFS History: Patient Active Problem List   Diagnosis Date Noted  . Aortic atherosclerosis ([place]) 11/19/2015  . Hyperlipemia 04/03/2015  . S/P revision of total knee 09/19/2014  . Seizure disorder ([place]) 01/24/2014  . Obesity, BMI unknown 01/24/2014  . Osteoarthritis of left knee 11/07/2013  . Osteopenia, stable on dexa 2010 - followed by gyn 06/15/2012  . Pap smear abnormality of cervix/human papillomavirus (HPV) positive - 09/2011, followed by gyn 06/15/2012  . Essential hypertension 07/03/2009  . Hypothyroidism 05/27/2007  . Allergic rhinitis 05/27/2007  . GERD 05/27/2007   Past Medical History:  Diagnosis Date  . ALLERGIC RHINITIS 05/27/2007   uses Flonase daily as needed and ALbuterol inhaler prn;gets Allergy shots  . Allergy   . Anxiety and depression 12/19/2009  . Arthritis   . Cancer (HCC)    vaginal  . Constipation   . Depression   . Diverticulosis   .  Environmental allergies   . Essential hypertension, benign 06/26/2009  . GERD 05/27/2007   takes Nexium daily  . Heart murmur    yrs ago  . History of gout   . History of migraine many yrs ago  . Hyperlipidemia    takes Pravastatin daily  . Hypothyroidism   . Insomnia    but doesn't take any meds  . LOW BACK PAIN 05/27/2007  . Numbness    left toes   . Osteoporosis   . Seizure disorder [facility])    reports remote, takes Tegretol daily, followed by neurology  . Seizures ([place])    last 1983, none since then    Family History  Problem Relation Age of Onset  .  Arthritis Mother   . Heart murmur Other   . Colon cancer Neg Hx   . Esophageal cancer Neg Hx   . Stomach cancer Neg Hx   . Rectal cancer Neg Hx     Past Surgical History:  Procedure Laterality Date  . ADENOIDECTOMY    . BACK SURGERY     x2  . COLONOSCOPY    . ESOPHAGOGASTRODUODENOSCOPY    . Heel spurs Bilateral   . KNEE ARTHROPLASTY Left 11/07/2013   Procedure: COMPUTER ASSISTED TOTAL KNEE ARTHROPLASTY;  Surgeon: Marybelle Killings, MD;  Location: [place];  Service: Orthopedics;  Laterality: Left;  Left Total Knee Arthroplasty, Cemented, Computer Assist  . TONSILLECTOMY    . TOTAL KNEE REVISION Left 09/19/2014   Procedure: LEFT TOTAL KNEE REVISION;  Surgeon: Leandrew Koyanagi, MD;  Location: [place];  Service: Orthopedics;  Laterality: Left;   Social History   Occupational History  . Retired    Social History Main Topics  . Smoking status: Former Smoker    Packs/day: 0.25    Years: 20.00    Types: Cigarettes    Quit date: 09/08/1978  . Smokeless tobacco: Never Used     Comment: quit smokikng 47yrs ago  . Alcohol use No  . Drug use: No  . Sexual activity: No

## 2016-06-12 ENCOUNTER — Telehealth (INDEPENDENT_AMBULATORY_CARE_PROVIDER_SITE_OTHER): Payer: Self-pay | Admitting: *Deleted

## 2016-06-12 ENCOUNTER — Ambulatory Visit (INDEPENDENT_AMBULATORY_CARE_PROVIDER_SITE_OTHER): Payer: Medicare HMO | Admitting: Psychology

## 2016-06-12 DIAGNOSIS — J3089 Other allergic rhinitis: Secondary | ICD-10-CM | POA: Diagnosis not present

## 2016-06-12 DIAGNOSIS — J3081 Allergic rhinitis due to animal (cat) (dog) hair and dander: Secondary | ICD-10-CM | POA: Diagnosis not present

## 2016-06-12 DIAGNOSIS — F411 Generalized anxiety disorder: Secondary | ICD-10-CM

## 2016-06-12 DIAGNOSIS — F331 Major depressive disorder, recurrent, moderate: Secondary | ICD-10-CM

## 2016-06-12 DIAGNOSIS — R69 Illness, unspecified: Secondary | ICD-10-CM | POA: Diagnosis not present

## 2016-06-12 DIAGNOSIS — J301 Allergic rhinitis due to pollen: Secondary | ICD-10-CM | POA: Diagnosis not present

## 2016-06-12 NOTE — Telephone Encounter (Signed)
Pt called and left vm stating she received a call from [facility] to get her scheduled for MRI of her Knee but states there is nothing wrong with her knee, says she came in for Left shoulder pain.   I called and left message on vm for pt to return my call for clarification and get more information. Pending call back from pt.

## 2016-06-18 NOTE — Progress Notes (Signed)
HPI:  Beverly Ballard is a pleasant 71 y.o. here for follow up. Chronic medical problems summarized below were reviewed for changes and stability and were updated as needed below. These issues and their treatment remain stable for the most part. Doing well. Saw her orthopedic specialist for the shoulder pain and had an injection. Now doing much better. Mood is good. Feels she is still eating much healthier. No regular exercise. Denies CP, SOB, DOE, treatment intolerance or new symptoms. AWV 10/2015 Reports she did her mammogram with her gynecologist in November.  L shoulder pain: -Saw orthopedic specialist, had injection -Much improved  HTN/HLD/Obesity: -meds:lisinopril 5, pravastatin 20, asa -wt 225 02/2015 --> 196 today  Aortic atherosclerosis/CAD: -incidental findings on CT scan -she is really working on diet; no regular exercise -father had heart attack in his 10s -on asa, lisinopril, statin  GERD/chronic nausea/constipation: -s/p eval with GI in 2017 -meds: nexium 20, zofran prn, senokot  Asthma and allergies: -meds: alb prn, flonase  Hypothyroid: -meds: levothyroxine  -sees endo, Dr. Wilson Singer  Depression: -meds: zoloft -saw Dr. Glennon Hamilton for some counseling in the past - feels doctor Glennon Hamilton has really helped her with coping with mother, stress from husband and feels much better   Seizure Disorder: -sees Dr. Sheppard Evens, neurology, [facility] -tegratol caused hyponatremia, now on zonisamide   ROS: See pertinent positives and negatives per HPI.  Past Medical History:  Diagnosis Date  . ALLERGIC RHINITIS 05/27/2007   uses Flonase daily as needed and ALbuterol inhaler prn;gets Allergy shots  . Allergy   . Anxiety and depression 12/19/2009  . Arthritis   . Cancer (HCC)    vaginal  . Constipation   . Depression   . Diverticulosis   . Environmental allergies   . Essential hypertension, benign 06/26/2009  . GERD 05/27/2007   takes Nexium daily  . Heart murmur    yrs ago  .  History of gout   . History of migraine many yrs ago  . Hyperlipidemia    takes Pravastatin daily  . Hypothyroidism   . Insomnia    but doesn't take any meds  . LOW BACK PAIN 05/27/2007  . Numbness    left toes   . Osteoporosis   . Seizure disorder [facility])    reports remote, takes Tegretol daily, followed by neurology  . Seizures ([place])    last 1983, none since then    Past Surgical History:  Procedure Laterality Date  . ADENOIDECTOMY    . BACK SURGERY     x2  . COLONOSCOPY    . ESOPHAGOGASTRODUODENOSCOPY    . Heel spurs Bilateral   . KNEE ARTHROPLASTY Left 11/07/2013   Procedure: COMPUTER ASSISTED TOTAL KNEE ARTHROPLASTY;  Surgeon: Marybelle Killings, MD;  Location: [place];  Service: Orthopedics;  Laterality: Left;  Left Total Knee Arthroplasty, Cemented, Computer Assist  . TONSILLECTOMY    . TOTAL KNEE REVISION Left 09/19/2014   Procedure: LEFT TOTAL KNEE REVISION;  Surgeon: Leandrew Koyanagi, MD;  Location: [place];  Service: Orthopedics;  Laterality: Left;    Family History  Problem Relation Age of Onset  . Arthritis Mother   . Heart murmur Other   . Colon cancer Neg Hx   . Esophageal cancer Neg Hx   . Stomach cancer Neg Hx   . Rectal cancer Neg Hx     Social History   Social History  . Marital status: Married    Spouse name: N/A  . Number of children: 1  .  Years of education: N/A   Occupational History  . Retired    Social History Main Topics  . Smoking status: Former Smoker    Packs/day: 0.25    Years: 20.00    Types: Cigarettes    Quit date: 09/08/1978  . Smokeless tobacco: Never Used     Comment: quit smokikng 45yrs ago  . Alcohol use No  . Drug use: No  . Sexual activity: No   Other Topics Concern  . None   Social History Narrative  . None     Current Outpatient Prescriptions:  .  albuterol (PROVENTIL HFA;VENTOLIN HFA) 108 (90 BASE) MCG/ACT inhaler, Inhale 1 puff into the lungs every 6 (six) hours as needed for wheezing or shortness of breath., Disp: ,  Rfl:  .  aspirin 81 MG tablet, Take 81 mg by mouth daily., Disp: , Rfl:  .  beta carotene w/minerals (OCUVITE) tablet, Take 1 tablet by mouth daily., Disp: , Rfl:  .  Cyanocobalamin (VITAMIN B 12 PO), Take by mouth daily.  , Disp: , Rfl:  .  cyclobenzaprine (FLEXERIL) 5 MG tablet, Take 1 tablet (5 mg total) by mouth at bedtime as needed for muscle spasms., Disp: 30 tablet, Rfl: 0 .  esomeprazole (NEXIUM) 20 MG capsule, Take 20 mg by mouth daily before breakfast., Disp: , Rfl:  .  fluticasone (FLONASE) 50 MCG/ACT nasal spray, Place 2 sprays into both nostrils daily., Disp: 16 g, Rfl: 6 .  Levothyroxine Sodium 150 MCG CAPS, Take 150 mcg by mouth daily. , Disp: , Rfl:  .  lisinopril (PRINIVIL,ZESTRIL) 5 MG tablet, Take 1 tablet (5 mg total) by mouth daily., Disp: 90 tablet, Rfl: 3 .  Multiple Vitamin (MULTIVITAMIN) capsule, Take 1 capsule by mouth daily.  , Disp: , Rfl:  .  Omega-3 Fatty Acids (FISH OIL PO), Take by mouth daily.  , Disp: , Rfl:  .  ondansetron (ZOFRAN) 4 MG tablet, Take 1 tablet (4 mg total) by mouth every 8 (eight) hours as needed for nausea or vomiting., Disp: 30 tablet, Rfl: 1 .  polyethylene glycol powder (GLYCOLAX/MIRALAX) powder, MIX 17 GRAMS DAILY AS DIRECTED, Disp: 255 g, Rfl: 0 .  pravastatin (PRAVACHOL) 20 MG tablet, TAKE 1 TABLET(20 MG) BY MOUTH DAILY, Disp: 90 tablet, Rfl: 1 .  senna-docusate (SENOKOT S) 8.6-50 MG per tablet, Take 1 tablet by mouth at bedtime as needed., Disp: 30 tablet, Rfl: 1 .  sertraline (ZOLOFT) 100 MG tablet, Take 1 tablet (100 mg total) by mouth daily. (Patient taking differently: Take 125 mg by mouth daily. ), Disp: 90 tablet, Rfl: 1 .  sertraline (ZOLOFT) 25 MG tablet, Take 25 mg by mouth daily., Disp: , Rfl:  .  zonisamide (ZONEGRAN) 100 MG capsule, Take 200 mg by mouth 2 (two) times daily., Disp: , Rfl:   EXAM:  Vitals:   06/19/16 1020  BP: 102/80  Pulse: 68  Temp: 98.4 F (36.9 C)    Body mass index is 32.77 kg/m.  GENERAL: vitals  reviewed and listed above, alert, oriented, appears well hydrated and in no acute distress  HEENT: atraumatic, conjunttiva clear, no obvious abnormalities on inspection of external nose and ears  NECK: no obvious masses on inspection  LUNGS: clear to auscultation bilaterally, no wheezes, rales or rhonchi, good air movement  CV: HRRR, no peripheral edema  MS: moves all extremities without noticeable abnormality  PSYCH: pleasant and cooperative, no obvious depression or anxiety  ASSESSMENT AND PLAN:  Discussed the following assessment and plan:  Essential hypertension - Plan: Basic metabolic panel, CBC  Class 1 obesity due to excess calories with body mass index (BMI) of 32.0 to 32.9 in adult, unspecified whether serious comorbidity present  Hypothyroidism, unspecified type - Plan: TSH  Hyperlipidemia, unspecified hyperlipidemia type  Aortic atherosclerosis (HCC)  Seizure disorder (HCC)  Recurrent major depressive disorder, in full remission ([place])  -Lifestyle recommendations, congratulated on diet changes, encouraged increase exercise -Labs today -Assistant instructed to abstract mammogram report -Wellness visit and follow-up in 3-4 months -Patient advised to return or notify a doctor immediately if symptoms worsen or persist or new concerns arise.  Patient Instructions  BEFORE YOU LEAVE: -follow up: Annual wellness visit with Manuela Schwartz and follow-up with Dr. Maudie Mercury in July. -Labs -Obtain mammogram report from GYN office and abstract, Dr. Benjie Karvonen  We have ordered labs or studies at this visit. It can take up to 1-2 weeks for results and processing. IF results require follow up or explanation, we will call you with instructions. Clinically stable results will be released to your [facility]. If you have not heard from [place] or cannot find your results in [facility] in 2 weeks please contact our office at [phone].  If you are not yet signed up for [facility], please consider signing  up.   We recommend the following healthy lifestyle for LIFE: 1) Small portions.   Tip: eat off of a salad plate instead of a dinner plate.  Tip: It is ok to feel hungry after a meal - that likely means you ate an appropriate portion.  Tip: if you need more or a snack choose fruits, veggies and/or a handful of nuts or seeds.  2) Eat a healthy clean diet.  * Tip: Avoid (less then 1 serving per week): processed foods, sweets, sweetened drinks, white starches (rice, flour, bread, potatoes, pasta, etc), red meat, fast foods, butter  *Tip: CHOOSE instead   * 5-9 servings per day of fresh or frozen fruits and vegetables (but not corn, potatoes, bananas, canned or dried fruit)   *nuts and seeds, beans   *olives and olive oil   *small portions of lean meats such as fish and white chicken    *small portions of whole grains  3)Get at least 150 minutes of sweaty aerobic exercise per week.  4)Reduce stress - consider counseling, meditation and relaxation to balance other aspects of your life.  **Coming March 12th**  [facility]!!!  Same Day Appointments for Acute Care: Sprains, Injuries, cuts, abrasions Colds, flu, sore throats, cough, upset stomachs Fever, ear pain Sinus and eye infections Animal/insect bites  3 Easy Ways to Schedule: Walk-In Scheduling Call in scheduling Mychart Sign-up: [email or web address]                  Colin Benton R., DO

## 2016-06-19 ENCOUNTER — Encounter: Payer: Self-pay | Admitting: Family Medicine

## 2016-06-19 ENCOUNTER — Ambulatory Visit (INDEPENDENT_AMBULATORY_CARE_PROVIDER_SITE_OTHER): Payer: Medicare HMO | Admitting: Family Medicine

## 2016-06-19 VITALS — BP 102/80 | HR 68 | Temp 98.4°F | Ht 65.0 in | Wt 196.9 lb

## 2016-06-19 DIAGNOSIS — G40909 Epilepsy, unspecified, not intractable, without status epilepticus: Secondary | ICD-10-CM

## 2016-06-19 DIAGNOSIS — J301 Allergic rhinitis due to pollen: Secondary | ICD-10-CM | POA: Diagnosis not present

## 2016-06-19 DIAGNOSIS — R69 Illness, unspecified: Secondary | ICD-10-CM | POA: Diagnosis not present

## 2016-06-19 DIAGNOSIS — E6609 Other obesity due to excess calories: Secondary | ICD-10-CM | POA: Diagnosis not present

## 2016-06-19 DIAGNOSIS — I700 Atherosclerosis of aorta: Secondary | ICD-10-CM

## 2016-06-19 DIAGNOSIS — Z6832 Body mass index (BMI) 32.0-32.9, adult: Secondary | ICD-10-CM | POA: Diagnosis not present

## 2016-06-19 DIAGNOSIS — E785 Hyperlipidemia, unspecified: Secondary | ICD-10-CM | POA: Diagnosis not present

## 2016-06-19 DIAGNOSIS — E039 Hypothyroidism, unspecified: Secondary | ICD-10-CM | POA: Diagnosis not present

## 2016-06-19 DIAGNOSIS — I10 Essential (primary) hypertension: Secondary | ICD-10-CM | POA: Diagnosis not present

## 2016-06-19 DIAGNOSIS — F3342 Major depressive disorder, recurrent, in full remission: Secondary | ICD-10-CM

## 2016-06-19 DIAGNOSIS — J3089 Other allergic rhinitis: Secondary | ICD-10-CM | POA: Diagnosis not present

## 2016-06-19 DIAGNOSIS — J3081 Allergic rhinitis due to animal (cat) (dog) hair and dander: Secondary | ICD-10-CM | POA: Diagnosis not present

## 2016-06-19 LAB — CBC
HCT: 40.6 % (ref 36.0–46.0)
HEMOGLOBIN: 13.7 g/dL (ref 12.0–15.0)
MCHC: 33.9 g/dL (ref 30.0–36.0)
MCV: 93.7 fl (ref 78.0–100.0)
Platelets: 184 10*3/uL (ref 150.0–400.0)
RBC: 4.33 Mil/uL (ref 3.87–5.11)
RDW: 13 % (ref 11.5–15.5)
WBC: 6 10*3/uL (ref 4.0–10.5)

## 2016-06-19 LAB — BASIC METABOLIC PANEL
BUN: 14 mg/dL (ref 6–23)
CO2: 26 meq/L (ref 19–32)
Calcium: 9.3 mg/dL (ref 8.4–10.5)
Chloride: 107 mEq/L (ref 96–112)
Creatinine, Ser: 0.8 mg/dL (ref 0.40–1.20)
GFR: 75.73 mL/min (ref >60.00)
GLUCOSE: 118 mg/dL — AB (ref 70–99)
POTASSIUM: 4 meq/L (ref 3.5–5.1)
Sodium: 139 mEq/L (ref 135–145)

## 2016-06-19 LAB — TSH: TSH: 0.23 u[IU]/mL — ABNORMAL LOW (ref 0.35–4.50)

## 2016-06-19 NOTE — Patient Instructions (Addendum)
BEFORE YOU LEAVE: -follow up: Annual wellness visit with Manuela Schwartz and follow-up with Dr. Maudie Mercury in July. -Labs -Obtain mammogram report from GYN office and abstract, Dr. Benjie Karvonen  We have ordered labs or studies at this visit. It can take up to 1-2 weeks for results and processing. IF results require follow up or explanation, we will call you with instructions. Clinically stable results will be released to your [facility]. If you have not heard from [place] or cannot find your results in [facility] in 2 weeks please contact our office at [phone].  If you are not yet signed up for [facility], please consider signing up.   We recommend the following healthy lifestyle for LIFE: 1) Small portions.   Tip: eat off of a salad plate instead of a dinner plate.  Tip: It is ok to feel hungry after a meal - that likely means you ate an appropriate portion.  Tip: if you need more or a snack choose fruits, veggies and/or a handful of nuts or seeds.  2) Eat a healthy clean diet.  * Tip: Avoid (less then 1 serving per week): processed foods, sweets, sweetened drinks, white starches (rice, flour, bread, potatoes, pasta, etc), red meat, fast foods, butter  *Tip: CHOOSE instead   * 5-9 servings per day of fresh or frozen fruits and vegetables (but not corn, potatoes, bananas, canned or dried fruit)   *nuts and seeds, beans   *olives and olive oil   *small portions of lean meats such as fish and white chicken    *small portions of whole grains  3)Get at least 150 minutes of sweaty aerobic exercise per week.  4)Reduce stress - consider counseling, meditation and relaxation to balance other aspects of your life.  **Coming March 12th**  [place] [facility] Fast Track!!!  Same Day Appointments for Acute Care: Sprains, Injuries, cuts, abrasions Colds, flu, sore throats, cough, upset stomachs Fever, ear pain Sinus and eye infections Animal/insect bites  3 Easy Ways to Schedule: Walk-In Scheduling Call in  scheduling Mychart Sign-up: [email or web address]

## 2016-06-19 NOTE — Progress Notes (Signed)
Pre visit review using our clinic review tool, if applicable. No additional management support is needed unless otherwise documented below in the visit note. 

## 2016-06-23 NOTE — Telephone Encounter (Signed)
Tried calling pt back from vm left, no answer, left message to return my call

## 2016-06-26 ENCOUNTER — Encounter: Payer: Self-pay | Admitting: Family Medicine

## 2016-06-27 ENCOUNTER — Other Ambulatory Visit: Payer: Self-pay | Admitting: *Deleted

## 2016-06-27 MED ORDER — LISINOPRIL 5 MG PO TABS: 5.0000 mg | ORAL_TABLET | Freq: Every day | ORAL | 1 refills | Status: DC

## 2016-06-27 MED ORDER — PRAVASTATIN SODIUM 20 MG PO TABS: ORAL_TABLET | ORAL | 1 refills | Status: DC

## 2016-06-27 NOTE — Telephone Encounter (Signed)
Rx done and the pt was notified via Mychart message. 

## 2016-06-30 ENCOUNTER — Ambulatory Visit (INDEPENDENT_AMBULATORY_CARE_PROVIDER_SITE_OTHER): Payer: Medicare HMO | Admitting: Psychology

## 2016-06-30 DIAGNOSIS — J3081 Allergic rhinitis due to animal (cat) (dog) hair and dander: Secondary | ICD-10-CM | POA: Diagnosis not present

## 2016-06-30 DIAGNOSIS — J301 Allergic rhinitis due to pollen: Secondary | ICD-10-CM | POA: Diagnosis not present

## 2016-06-30 DIAGNOSIS — R69 Illness, unspecified: Secondary | ICD-10-CM | POA: Diagnosis not present

## 2016-06-30 DIAGNOSIS — F331 Major depressive disorder, recurrent, moderate: Secondary | ICD-10-CM

## 2016-06-30 DIAGNOSIS — J3089 Other allergic rhinitis: Secondary | ICD-10-CM | POA: Diagnosis not present

## 2016-07-03 ENCOUNTER — Ambulatory Visit (INDEPENDENT_AMBULATORY_CARE_PROVIDER_SITE_OTHER): Payer: Medicare HMO

## 2016-07-03 ENCOUNTER — Encounter (INDEPENDENT_AMBULATORY_CARE_PROVIDER_SITE_OTHER): Payer: Self-pay | Admitting: Orthopaedic Surgery

## 2016-07-03 ENCOUNTER — Ambulatory Visit (INDEPENDENT_AMBULATORY_CARE_PROVIDER_SITE_OTHER): Payer: Medicare HMO | Admitting: Orthopaedic Surgery

## 2016-07-03 DIAGNOSIS — G8929 Other chronic pain: Secondary | ICD-10-CM

## 2016-07-03 DIAGNOSIS — M25562 Pain in left knee: Secondary | ICD-10-CM | POA: Diagnosis not present

## 2016-07-03 NOTE — Progress Notes (Signed)
Office Visit Note   Patient: Beverly Ballard           Date of Birth: 09-19-1945           MRN: [id number] Visit Date: 07/03/2016              Requested by: Lucretia Kern, DO [place], [place] [address] PCP: Lucretia Kern., DO   Assessment & Plan: Visit Diagnoses:  1. Chronic pain of left knee     Plan: X-rays of the left knee show stable knee replacement. I'll like to see her back in 2 years for follow-up of this with repeat 2 view x-rays of the left knee. In terms of the shoulder she is doing well.  Follow-Up Instructions: Return in about 2 years (around 07/04/2018).   Orders:  Orders Placed This Encounter  Procedures  . XR Knee 1-2 Views Left   No orders of the defined types were placed in this encounter.     Procedures: No procedures performed   Clinical Data: No additional findings.   Subjective: Chief Complaint  Patient presents with  . Left Shoulder - Pain, Follow-up  . Left Knee - Pain, Follow-up    Patient comes in today to review her left shoulder pain which is significantly better since the injection. She also wants to follow-up for her 2 year appointment for her left knee replacement. She feels like the knee feels heavy. She is doing home exercises. She feels some cracking and popping. Denies any constitutional symptoms. The shoulder is doing much better.    Review of Systems  Constitutional: Negative.   HENT: Negative.   Eyes: Negative.   Respiratory: Negative.   Cardiovascular: Negative.   Endocrine: Negative.   Musculoskeletal: Negative.   Neurological: Negative.   Hematological: Negative.   Psychiatric/Behavioral: Negative.   All other systems reviewed and are negative.    Objective: Vital Signs: There were no vitals taken for this visit.  Physical Exam  Constitutional: She is oriented to person, place, and time. She appears well-developed and well-nourished.  Pulmonary/Chest: Effort normal.  Neurological: She is  alert and oriented to person, place, and time.  Skin: Skin is warm. Capillary refill takes less than 2 seconds.  Psychiatric: She has a normal mood and affect. Her behavior is normal. Judgment and thought content normal.  Nursing note and vitals reviewed.   Ortho Exam Exam of the left shoulder is significantly improved in terms of strength and range of motion. Exam of the left knee is benign. Specialty Comments:  No specialty comments available.  Imaging: Xr Knee 1-2 Views Left  Result Date: 07/03/2016 Stable left total knee replacement without evidence of complications    PMFS History: Patient Active Problem List   Diagnosis Date Noted  . Aortic atherosclerosis ([place]) 11/19/2015  . Hyperlipemia 04/03/2015  . S/P revision of total knee 09/19/2014  . Seizure disorder ([place]) 01/24/2014  . Obesity, BMI unknown 01/24/2014  . Osteoarthritis of left knee 11/07/2013  . Osteopenia, stable on dexa 2010 - followed by gyn 06/15/2012  . Pap smear abnormality of cervix/human papillomavirus (HPV) positive - 09/2011, followed by gyn 06/15/2012  . Essential hypertension 07/03/2009  . Hypothyroidism 05/27/2007  . Allergic rhinitis 05/27/2007  . GERD 05/27/2007   Past Medical History:  Diagnosis Date  . ALLERGIC RHINITIS 05/27/2007   uses Flonase daily as needed and ALbuterol inhaler prn;gets Allergy shots  . Allergy   . Anxiety and depression 12/19/2009  . Arthritis   .  Cancer (HCC)    vaginal  . Constipation   . Depression   . Diverticulosis   . Environmental allergies   . Essential hypertension, benign 06/26/2009  . GERD 05/27/2007   takes Nexium daily  . Heart murmur    yrs ago  . History of gout   . History of migraine many yrs ago  . Hyperlipidemia    takes Pravastatin daily  . Hypothyroidism   . Insomnia    but doesn't take any meds  . LOW BACK PAIN 05/27/2007  . Numbness    left toes   . Osteoporosis   . Seizure disorder [facility])    reports remote, takes Tegretol daily,  followed by neurology  . Seizures ([place])    last 1983, none since then    Family History  Problem Relation Age of Onset  . Arthritis Mother   . Heart murmur Other   . Colon cancer Neg Hx   . Esophageal cancer Neg Hx   . Stomach cancer Neg Hx   . Rectal cancer Neg Hx     Past Surgical History:  Procedure Laterality Date  . ADENOIDECTOMY    . BACK SURGERY     x2  . COLONOSCOPY    . ESOPHAGOGASTRODUODENOSCOPY    . Heel spurs Bilateral   . KNEE ARTHROPLASTY Left 11/07/2013   Procedure: COMPUTER ASSISTED TOTAL KNEE ARTHROPLASTY;  Surgeon: Marybelle Killings, MD;  Location: [place];  Service: Orthopedics;  Laterality: Left;  Left Total Knee Arthroplasty, Cemented, Computer Assist  . TONSILLECTOMY    . TOTAL KNEE REVISION Left 09/19/2014   Procedure: LEFT TOTAL KNEE REVISION;  Surgeon: Leandrew Koyanagi, MD;  Location: [place];  Service: Orthopedics;  Laterality: Left;   Social History   Occupational History  . Retired    Social History Main Topics  . Smoking status: Former Smoker    Packs/day: 0.25    Years: 20.00    Types: Cigarettes    Quit date: 09/08/1978  . Smokeless tobacco: Never Used     Comment: quit smokikng 20yrs ago  . Alcohol use No  . Drug use: No  . Sexual activity: No

## 2016-07-10 DIAGNOSIS — J3089 Other allergic rhinitis: Secondary | ICD-10-CM | POA: Diagnosis not present

## 2016-07-10 DIAGNOSIS — J3081 Allergic rhinitis due to animal (cat) (dog) hair and dander: Secondary | ICD-10-CM | POA: Diagnosis not present

## 2016-07-10 DIAGNOSIS — J301 Allergic rhinitis due to pollen: Secondary | ICD-10-CM | POA: Diagnosis not present

## 2016-07-17 DIAGNOSIS — J301 Allergic rhinitis due to pollen: Secondary | ICD-10-CM | POA: Diagnosis not present

## 2016-07-17 DIAGNOSIS — J3081 Allergic rhinitis due to animal (cat) (dog) hair and dander: Secondary | ICD-10-CM | POA: Diagnosis not present

## 2016-07-17 DIAGNOSIS — J3089 Other allergic rhinitis: Secondary | ICD-10-CM | POA: Diagnosis not present

## 2016-07-22 DIAGNOSIS — R69 Illness, unspecified: Secondary | ICD-10-CM | POA: Diagnosis not present

## 2016-07-25 DIAGNOSIS — J3089 Other allergic rhinitis: Secondary | ICD-10-CM | POA: Diagnosis not present

## 2016-07-25 DIAGNOSIS — J3081 Allergic rhinitis due to animal (cat) (dog) hair and dander: Secondary | ICD-10-CM | POA: Diagnosis not present

## 2016-07-25 DIAGNOSIS — J301 Allergic rhinitis due to pollen: Secondary | ICD-10-CM | POA: Diagnosis not present

## 2016-09-10 DIAGNOSIS — J3081 Allergic rhinitis due to animal (cat) (dog) hair and dander: Secondary | ICD-10-CM | POA: Diagnosis not present

## 2016-09-10 DIAGNOSIS — J3089 Other allergic rhinitis: Secondary | ICD-10-CM | POA: Diagnosis not present

## 2016-09-10 DIAGNOSIS — J301 Allergic rhinitis due to pollen: Secondary | ICD-10-CM | POA: Diagnosis not present

## 2016-09-11 ENCOUNTER — Ambulatory Visit (INDEPENDENT_AMBULATORY_CARE_PROVIDER_SITE_OTHER): Payer: Medicare HMO | Admitting: Psychology

## 2016-09-11 DIAGNOSIS — R69 Illness, unspecified: Secondary | ICD-10-CM | POA: Diagnosis not present

## 2016-09-11 DIAGNOSIS — F330 Major depressive disorder, recurrent, mild: Secondary | ICD-10-CM | POA: Diagnosis not present

## 2016-09-12 DIAGNOSIS — J3089 Other allergic rhinitis: Secondary | ICD-10-CM | POA: Diagnosis not present

## 2016-09-12 DIAGNOSIS — J301 Allergic rhinitis due to pollen: Secondary | ICD-10-CM | POA: Diagnosis not present

## 2016-09-12 DIAGNOSIS — J3081 Allergic rhinitis due to animal (cat) (dog) hair and dander: Secondary | ICD-10-CM | POA: Diagnosis not present

## 2016-09-16 ENCOUNTER — Ambulatory Visit (INDEPENDENT_AMBULATORY_CARE_PROVIDER_SITE_OTHER): Payer: Self-pay | Admitting: Orthopaedic Surgery

## 2016-09-17 DIAGNOSIS — J3081 Allergic rhinitis due to animal (cat) (dog) hair and dander: Secondary | ICD-10-CM | POA: Diagnosis not present

## 2016-09-17 DIAGNOSIS — J3089 Other allergic rhinitis: Secondary | ICD-10-CM | POA: Diagnosis not present

## 2016-09-17 DIAGNOSIS — J301 Allergic rhinitis due to pollen: Secondary | ICD-10-CM | POA: Diagnosis not present

## 2016-09-19 ENCOUNTER — Ambulatory Visit: Payer: Medicare HMO

## 2016-09-22 ENCOUNTER — Ambulatory Visit (INDEPENDENT_AMBULATORY_CARE_PROVIDER_SITE_OTHER)
Admission: RE | Admit: 2016-09-22 | Discharge: 2016-09-22 | Disposition: A | Discharge status: Home / Self Care | Payer: Medicare HMO | Source: Ambulatory Visit | Attending: Family Medicine | Admitting: Family Medicine

## 2016-09-22 ENCOUNTER — Ambulatory Visit (INDEPENDENT_AMBULATORY_CARE_PROVIDER_SITE_OTHER): Payer: Medicare HMO | Admitting: Family Medicine

## 2016-09-22 ENCOUNTER — Encounter: Payer: Self-pay | Admitting: Family Medicine

## 2016-09-22 VITALS — BP 122/58 | HR 67 | Temp 97.9°F | Ht 65.0 in | Wt 198.2 lb

## 2016-09-22 DIAGNOSIS — R5383 Other fatigue: Secondary | ICD-10-CM | POA: Diagnosis not present

## 2016-09-22 DIAGNOSIS — R059 Cough, unspecified: Secondary | ICD-10-CM

## 2016-09-22 DIAGNOSIS — I10 Essential (primary) hypertension: Secondary | ICD-10-CM | POA: Diagnosis not present

## 2016-09-22 DIAGNOSIS — R05 Cough: Secondary | ICD-10-CM | POA: Diagnosis not present

## 2016-09-22 DIAGNOSIS — E039 Hypothyroidism, unspecified: Secondary | ICD-10-CM

## 2016-09-22 DIAGNOSIS — I700 Atherosclerosis of aorta: Secondary | ICD-10-CM

## 2016-09-22 DIAGNOSIS — E6609 Other obesity due to excess calories: Secondary | ICD-10-CM | POA: Diagnosis not present

## 2016-09-22 DIAGNOSIS — Z6832 Body mass index (BMI) 32.0-32.9, adult: Secondary | ICD-10-CM | POA: Diagnosis not present

## 2016-09-22 DIAGNOSIS — J3081 Allergic rhinitis due to animal (cat) (dog) hair and dander: Secondary | ICD-10-CM | POA: Diagnosis not present

## 2016-09-22 DIAGNOSIS — J301 Allergic rhinitis due to pollen: Secondary | ICD-10-CM | POA: Diagnosis not present

## 2016-09-22 DIAGNOSIS — G40909 Epilepsy, unspecified, not intractable, without status epilepticus: Secondary | ICD-10-CM | POA: Diagnosis not present

## 2016-09-22 DIAGNOSIS — J3089 Other allergic rhinitis: Secondary | ICD-10-CM | POA: Diagnosis not present

## 2016-09-22 LAB — CBC
HEMATOCRIT: 40.4 % (ref 36.0–46.0)
HEMOGLOBIN: 13.5 g/dL (ref 12.0–15.0)
MCHC: 33.5 g/dL (ref 30.0–36.0)
MCV: 94.2 fl (ref 78.0–100.0)
Platelets: 188 10*3/uL (ref 150.0–400.0)
RBC: 4.29 Mil/uL (ref 3.87–5.11)
RDW: 13.6 % (ref 11.5–15.5)
WBC: 7.1 10*3/uL (ref 4.0–10.5)

## 2016-09-22 LAB — BASIC METABOLIC PANEL
BUN: 14 mg/dL (ref 6–23)
CHLORIDE: 109 meq/L (ref 96–112)
CO2: 28 meq/L (ref 19–32)
CREATININE: 0.88 mg/dL (ref 0.40–1.20)
Calcium: 9.3 mg/dL (ref 8.4–10.5)
GFR: 67.79 mL/min (ref >60.00)
Glucose, Bld: 120 mg/dL — ABNORMAL HIGH (ref 70–99)
POTASSIUM: 4.1 meq/L (ref 3.5–5.1)
Sodium: 143 mEq/L (ref 135–145)

## 2016-09-22 LAB — TSH: TSH: 0.13 u[IU]/mL — ABNORMAL LOW (ref 0.35–4.50)

## 2016-09-22 LAB — HEMOGLOBIN A1C: Hgb A1c MFr Bld: 5.7 % (ref 4.6–6.5)

## 2016-09-22 NOTE — Progress Notes (Signed)
HPI:  Acute visit for fatigue and cough. She had a stomach bug while traveling in [place] a few weeks ago. Also she has been depressed more since returning as wishes she could stay and live in [place] where her family is, but reports that is not an option. She is seeing Dr. Glennon Hamilton for this and does not feel needs further help. No SI.  Feels tired since returning. No other symptoms except occ cough. No fevers, change in bowels, vomiting, rashes, SOB, CP, snoring, daytime somnolence, poor sleep, wt loss, dysuria or any other symptoms. Reports sees her gynecologist on a regular basis. He endocrinologist is retiring and did not return her calls regarding her last thyroid check which was off. She wants to recheck and wants me to manage moving forward. She also wants to switch to levothyroxine.  ROS: See pertinent positives and negatives per HPI.  Past Medical History:  Diagnosis Date  . ALLERGIC RHINITIS 05/27/2007   uses Flonase daily as needed and ALbuterol inhaler prn;gets Allergy shots  . Allergy   . Anxiety and depression 12/19/2009  . Arthritis   . Cancer (HCC)    vaginal  . Constipation   . Depression   . Diverticulosis   . Environmental allergies   . Essential hypertension, benign 06/26/2009  . GERD 05/27/2007   takes Nexium daily  . Heart murmur    yrs ago  . History of gout   . History of migraine many yrs ago  . Hyperlipidemia    takes Pravastatin daily  . Hypothyroidism   . Insomnia    but doesn't take any meds  . LOW BACK PAIN 05/27/2007  . Numbness    left toes   . Osteoporosis   . Seizure disorder [facility])    reports remote, takes Tegretol daily, followed by neurology  . Seizures ([place])    last 1983, none since then    Past Surgical History:  Procedure Laterality Date  . ADENOIDECTOMY    . BACK SURGERY     x2  . COLONOSCOPY    . ESOPHAGOGASTRODUODENOSCOPY    . Heel spurs Bilateral   . KNEE ARTHROPLASTY Left 11/07/2013   Procedure: COMPUTER ASSISTED TOTAL KNEE  ARTHROPLASTY;  Surgeon: Marybelle Killings, MD;  Location: [place];  Service: Orthopedics;  Laterality: Left;  Left Total Knee Arthroplasty, Cemented, Computer Assist  . TONSILLECTOMY    . TOTAL KNEE REVISION Left 09/19/2014   Procedure: LEFT TOTAL KNEE REVISION;  Surgeon: Leandrew Koyanagi, MD;  Location: [place];  Service: Orthopedics;  Laterality: Left;    Family History  Problem Relation Age of Onset  . Arthritis Mother   . Heart murmur Other   . Colon cancer Neg Hx   . Esophageal cancer Neg Hx   . Stomach cancer Neg Hx   . Rectal cancer Neg Hx     Social History   Social History  . Marital status: Married    Spouse name: N/A  . Number of children: 1  . Years of education: N/A   Occupational History  . Retired    Social History Main Topics  . Smoking status: Former Smoker    Packs/day: 0.25    Years: 20.00    Types: Cigarettes    Quit date: 09/08/1978  . Smokeless tobacco: Never Used     Comment: quit smokikng 72yrs ago  . Alcohol use No  . Drug use: No  . Sexual activity: No   Other Topics Concern  . None  Social History Narrative  . None     Current Outpatient Prescriptions:  .  albuterol (PROVENTIL HFA;VENTOLIN HFA) 108 (90 BASE) MCG/ACT inhaler, Inhale 1 puff into the lungs every 6 (six) hours as needed for wheezing or shortness of breath., Disp: , Rfl:  .  aspirin 81 MG tablet, Take 81 mg by mouth daily., Disp: , Rfl:  .  beta carotene w/minerals (OCUVITE) tablet, Take 1 tablet by mouth daily., Disp: , Rfl:  .  Cyanocobalamin (VITAMIN B 12 PO), Take by mouth daily.  , Disp: , Rfl:  .  cyclobenzaprine (FLEXERIL) 5 MG tablet, Take 1 tablet (5 mg total) by mouth at bedtime as needed for muscle spasms., Disp: 30 tablet, Rfl: 0 .  esomeprazole (NEXIUM) 20 MG capsule, Take 20 mg by mouth daily before breakfast., Disp: , Rfl:  .  fluticasone (FLONASE) 50 MCG/ACT nasal spray, Place 2 sprays into both nostrils daily., Disp: 16 g, Rfl: 6 .  Levothyroxine Sodium 150 MCG CAPS,  Take 150 mcg by mouth daily. , Disp: , Rfl:  .  lisinopril (PRINIVIL,ZESTRIL) 5 MG tablet, Take 1 tablet (5 mg total) by mouth daily., Disp: 90 tablet, Rfl: 1 .  Multiple Vitamin (MULTIVITAMIN) capsule, Take 1 capsule by mouth daily.  , Disp: , Rfl:  .  Omega-3 Fatty Acids (FISH OIL PO), Take by mouth daily.  , Disp: , Rfl:  .  ondansetron (ZOFRAN) 4 MG tablet, Take 1 tablet (4 mg total) by mouth every 8 (eight) hours as needed for nausea or vomiting., Disp: 30 tablet, Rfl: 1 .  polyethylene glycol powder (GLYCOLAX/MIRALAX) powder, MIX 17 GRAMS DAILY AS DIRECTED, Disp: 255 g, Rfl: 0 .  pravastatin (PRAVACHOL) 20 MG tablet, TAKE 1 TABLET(20 MG) BY MOUTH DAILY, Disp: 90 tablet, Rfl: 1 .  senna-docusate (SENOKOT S) 8.6-50 MG per tablet, Take 1 tablet by mouth at bedtime as needed., Disp: 30 tablet, Rfl: 1 .  sertraline (ZOLOFT) 100 MG tablet, Take 1 tablet (100 mg total) by mouth daily. (Patient taking differently: Take 125 mg by mouth daily. ), Disp: 90 tablet, Rfl: 1 .  sertraline (ZOLOFT) 25 MG tablet, Take 25 mg by mouth daily., Disp: , Rfl:  .  zonisamide (ZONEGRAN) 100 MG capsule, Take 200 mg by mouth 2 (two) times daily., Disp: , Rfl:   EXAM:  Vitals:   09/22/16 1346  BP: (!) 122/58  Pulse: 67  Temp: 97.9 F (36.6 C)    Body mass index is 32.98 kg/m.  GENERAL: vitals reviewed and listed above, alert, oriented, appears well hydrated and in no acute distress  HEENT: atraumatic, conjunttiva clear, no obvious abnormalities on inspection of external nose and ears  NECK: no obvious masses on inspection  LUNGS: clear to auscultation bilaterally, no wheezes, rales or rhonchi, good air movement  CV: HRRR, no peripheral edema  MS: moves all extremities without noticeable abnormality  PSYCH: pleasant and cooperative, no obvious depression or anxiety  ASSESSMENT AND PLAN:  Discussed the following assessment and plan:  Cough - Plan: DG Chest 2 View  Fatigue, unspecified type -  Plan: TSH, Hemoglobin A1c, DG Chest 2 View  Hypothyroidism, unspecified type - Plan: TSH  Essential hypertension - Plan: Basic metabolic panel, CBC  Class 1 obesity due to excess calories with body mass index (BMI) of 32.0 to 32.9 in adult, unspecified whether serious comorbidity present  Aortic atherosclerosis (HCC)  Seizure disorder (HCC)  -CXR and labs per orders for fatigue -the mood may be the main issue  causing her to feels tired, but will check studies to ensure not something else going on, also thyroid level may be off - we will adjust if so since she reports her endocrinologist is retiring and di not return her calls after last thyroid check -cont tx for depression with Dr. Glennon Hamilton and meds -lifestyle recs -follow up 1 month as scheduled, sooner if worsening or new symptoms -Patient advised to return or notify a doctor immediately if symptoms worsen or persist or new concerns arise.  Patient Instructions  BEFORE YOU LEAVE: -xray sheet -labs -follow up: keep appointments as scheduled  Go get the xray today.  We have ordered labs or studies at this visit. It can take up to 1-2 weeks for results and processing. IF results require follow up or explanation, we will call you with instructions. Clinically stable results will be released to your [facility]. If you have not heard from [place] or cannot find your results in [facility] in 2 weeks please contact our office at [phone].  If you are not yet signed up for [facility], please consider signing up.  Follow up with Dr. Glennon Hamilton as planned  Advise regular aerobic exercise (at least 150 minutes per week of sweaty exercise) and a healthy diet. Try to eat at least 5-9 servings of vegetables and fruits per day (not corn, potatoes or bananas.) Avoid sweets, red meat, pork, butter, fried foods, fast food, processed food, excessive dairy, eggs and coconut. Replace bad fats with good fats - fish, nuts and seeds, canola oil, olive oil.   I hope you are  feeling better soon! Seek care sooner if worsening, new concerns or you are not improving.  WE NOW OFFER    [facility]ST TR[facility]!!!  SAME DAY Appointments for ACUTE CARE  Such as: Sprains, Injuries, cuts, abrasions, rashes, muscle pain, joint pain, back pain Colds, flu, sore throats, headache, allergies, cough, fever  Ear pain, sinus and eye infections Abdominal pain, nausea, vomiting, diarrhea, upset stomach Animal/insect bites  3 Easy Ways to Schedule: Walk-In Scheduling Call in scheduling Mychart Sign-up: [email or web address]                 Colin Benton R., DO

## 2016-09-22 NOTE — Patient Instructions (Signed)
BEFORE YOU LEAVE: -xray sheet -labs -follow up: keep appointments as scheduled  Go get the xray today.  We have ordered labs or studies at this visit. It can take up to 1-2 weeks for results and processing. IF results require follow up or explanation, we will call you with instructions. Clinically stable results will be released to your [facility]. If you have not heard from [place] or cannot find your results in [facility] in 2 weeks please contact our office at [phone].  If you are not yet signed up for [facility], please consider signing up.  Follow up with Dr. Glennon Hamilton as planned  Advise regular aerobic exercise (at least 150 minutes per week of sweaty exercise) and a healthy diet. Try to eat at least 5-9 servings of vegetables and fruits per day (not corn, potatoes or bananas.) Avoid sweets, red meat, pork, butter, fried foods, fast food, processed food, excessive dairy, eggs and coconut. Replace bad fats with good fats - fish, nuts and seeds, canola oil, olive oil.   I hope you are feeling better soon! Seek care sooner if worsening, new concerns or you are not improving.  WE NOW OFFER   [place] [facility] FAST TRACK!!!  SAME DAY Appointments for ACUTE CARE  Such as: Sprains, Injuries, cuts, abrasions, rashes, muscle pain, joint pain, back pain Colds, flu, sore throats, headache, allergies, cough, fever  Ear pain, sinus and eye infections Abdominal pain, nausea, vomiting, diarrhea, upset stomach Animal/insect bites  3 Easy Ways to Schedule: Walk-In Scheduling Call in scheduling Mychart Sign-up: [email or web address]

## 2016-09-23 MED ORDER — LEVOTHYROXINE SODIUM 137 MCG PO TABS: 137.0000 ug | ORAL_TABLET | Freq: Every day | ORAL | 1 refills | Status: DC

## 2016-09-23 NOTE — Addendum Note (Signed)
Addended by: Agnes Lawrence on: 09/23/2016 10:23 AM   Modules accepted: Orders

## 2016-09-23 NOTE — Addendum Note (Signed)
Addended by: Agnes Lawrence on: 09/23/2016 10:24 AM   Modules accepted: Orders

## 2016-10-01 ENCOUNTER — Ambulatory Visit: Payer: Medicare HMO | Admitting: Psychology

## 2016-10-02 ENCOUNTER — Ambulatory Visit (INDEPENDENT_AMBULATORY_CARE_PROVIDER_SITE_OTHER): Payer: Medicare HMO | Admitting: Psychology

## 2016-10-02 DIAGNOSIS — F411 Generalized anxiety disorder: Secondary | ICD-10-CM | POA: Diagnosis not present

## 2016-10-02 DIAGNOSIS — F330 Major depressive disorder, recurrent, mild: Secondary | ICD-10-CM | POA: Diagnosis not present

## 2016-10-02 DIAGNOSIS — R69 Illness, unspecified: Secondary | ICD-10-CM | POA: Diagnosis not present

## 2016-10-02 DIAGNOSIS — J301 Allergic rhinitis due to pollen: Secondary | ICD-10-CM | POA: Diagnosis not present

## 2016-10-02 DIAGNOSIS — J3081 Allergic rhinitis due to animal (cat) (dog) hair and dander: Secondary | ICD-10-CM | POA: Diagnosis not present

## 2016-10-02 DIAGNOSIS — J3089 Other allergic rhinitis: Secondary | ICD-10-CM | POA: Diagnosis not present

## 2016-10-06 NOTE — Progress Notes (Signed)
HPI:  Acute visit for Diarrhea: -started 2 weeks ago -reports 3-4 episodes of watery foul smelling diarrhea and fatigue -denies: hematochezia, melena, vomiting, anorexia, fevers, abd pain, recent abx -did travel recently and had some diarrhea during her travels which resolved -reports no episodes diarrhea today for the first time  ROS: See pertinent positives and negatives per HPI.  Past Medical History:  Diagnosis Date  . ALLERGIC RHINITIS 05/27/2007   uses Flonase daily as needed and ALbuterol inhaler prn;gets Allergy shots  . Allergy   . Anxiety and depression 12/19/2009  . Arthritis   . Cancer (HCC)    vaginal  . Constipation   . Depression   . Diverticulosis   . Environmental allergies   . Essential hypertension, benign 06/26/2009  . GERD 05/27/2007   takes Nexium daily  . Heart murmur    yrs ago  . History of gout   . History of migraine many yrs ago  . Hyperlipidemia    takes Pravastatin daily  . Hypothyroidism   . Insomnia    but doesn't take any meds  . LOW BACK PAIN 05/27/2007  . Numbness    left toes   . Osteoporosis   . Seizure disorder [facility])    reports remote, takes Tegretol daily, followed by neurology  . Seizures ([place])    last 1983, none since then    Past Surgical History:  Procedure Laterality Date  . ADENOIDECTOMY    . BACK SURGERY     x2  . COLONOSCOPY    . ESOPHAGOGASTRODUODENOSCOPY    . Heel spurs Bilateral   . KNEE ARTHROPLASTY Left 11/07/2013   Procedure: COMPUTER ASSISTED TOTAL KNEE ARTHROPLASTY;  Surgeon: Marybelle Killings, MD;  Location: [facility];  Service: Orthopedics;  Laterality: Left;  Left Total Knee Arthroplasty, Cemented, Computer Assist  . TONSILLECTOMY    . TOTAL KNEE REVISION Left 09/19/2014   Procedure: LEFT TOTAL KNEE REVISION;  Surgeon: Leandrew Koyanagi, MD;  Location: [facility];  Service: Orthopedics;  Laterality: Left;    Family History  Problem Relation Age of Onset  . Arthritis Mother   . Heart murmur Other   . Colon cancer Neg Hx    . Esophageal cancer Neg Hx   . Stomach cancer Neg Hx   . Rectal cancer Neg Hx     Social History   Social History  . Marital status: Married    Spouse name: N/A  . Number of children: 1  . Years of education: N/A   Occupational History  . Retired    Social History Main Topics  . Smoking status: Former Smoker    Packs/day: 0.25    Years: 20.00    Types: Cigarettes    Quit date: 09/08/1978  . Smokeless tobacco: Never Used     Comment: quit smokikng 67yrs ago  . Alcohol use No  . Drug use: No  . Sexual activity: No   Other Topics Concern  . None   Social History Narrative  . None     Current Outpatient Prescriptions:  .  albuterol (PROVENTIL HFA;VENTOLIN HFA) 108 (90 BASE) MCG/ACT inhaler, Inhale 1 puff into the lungs every 6 (six) hours as needed for wheezing or shortness of breath., Disp: , Rfl:  .  aspirin 81 MG tablet, Take 81 mg by mouth daily., Disp: , Rfl:  .  beta carotene w/minerals (OCUVITE) tablet, Take 1 tablet by mouth daily., Disp: , Rfl:  .  Cyanocobalamin (VITAMIN B 12 PO), Take by mouth  daily.  , Disp: , Rfl:  .  cyclobenzaprine (FLEXERIL) 5 MG tablet, Take 1 tablet (5 mg total) by mouth at bedtime as needed for muscle spasms., Disp: 30 tablet, Rfl: 0 .  esomeprazole (NEXIUM) 20 MG capsule, Take 20 mg by mouth daily before breakfast., Disp: , Rfl:  .  fluticasone (FLONASE) 50 MCG/ACT nasal spray, Place 2 sprays into both nostrils daily., Disp: 16 g, Rfl: 6 .  levothyroxine (SYNTHROID, LEVOTHROID) 137 MCG tablet, Take 1 tablet (137 mcg total) by mouth daily before breakfast., Disp: 60 tablet, Rfl: 1 .  lisinopril (PRINIVIL,ZESTRIL) 5 MG tablet, Take 1 tablet (5 mg total) by mouth daily., Disp: 90 tablet, Rfl: 1 .  Multiple Vitamin (MULTIVITAMIN) capsule, Take 1 capsule by mouth daily.  , Disp: , Rfl:  .  Omega-3 Fatty Acids (FISH OIL PO), Take by mouth daily.  , Disp: , Rfl:  .  ondansetron (ZOFRAN) 4 MG tablet, Take 1 tablet (4 mg total) by mouth every 8  (eight) hours as needed for nausea or vomiting., Disp: 30 tablet, Rfl: 1 .  polyethylene glycol powder (GLYCOLAX/MIRALAX) powder, MIX 17 GRAMS DAILY AS DIRECTED, Disp: 255 g, Rfl: 0 .  pravastatin (PRAVACHOL) 20 MG tablet, TAKE 1 TABLET(20 MG) BY MOUTH DAILY, Disp: 90 tablet, Rfl: 1 .  senna-docusate (SENOKOT S) 8.6-50 MG per tablet, Take 1 tablet by mouth at bedtime as needed., Disp: 30 tablet, Rfl: 1 .  sertraline (ZOLOFT) 100 MG tablet, Take 1 tablet (100 mg total) by mouth daily. (Patient taking differently: Take 125 mg by mouth daily. ), Disp: 90 tablet, Rfl: 1 .  sertraline (ZOLOFT) 25 MG tablet, Take 25 mg by mouth daily., Disp: , Rfl:  .  zonisamide (ZONEGRAN) 100 MG capsule, Take 200 mg by mouth 2 (two) times daily., Disp: , Rfl:   EXAM:  Vitals:   10/07/16 0814  BP: 120/74  Pulse: 70  Temp: 97.9 F (36.6 C)    Body mass index is 33.05 kg/m.  GENERAL: vitals reviewed and listed above, alert, oriented, appears well hydrated and in no acute distress  HEENT: atraumatic, conjunttiva clear, no obvious abnormalities on inspection of external nose and ears  NECK: no obvious masses on inspection  LUNGS: clear to auscultation bilaterally, no wheezes, rales or rhonchi, good air movement  CV: HRRR, no peripheral edema  ABD: BS+, soft, NTTP, no rebound or guarding  MS: moves all extremities without noticeable abnormality  PSYCH: pleasant and cooperative, no obvious depression or anxiety  ASSESSMENT AND PLAN:  Discussed the following assessment and plan:  Diarrhea of presumed infectious origin - Plan: Ova and Parasite Exam, Clostridium Difficile by PCR, Stool culture, Basic metabolic panel, CBC   -we discussed possible serious and likely etiologies, workup and treatment, treatment risks and return precautions -hopefully resolving given better today -will check stool studies and labs given duration and prior episode recently -no dairy, imodium and close follow up -Patient  advised to return or notify a doctor immediately if symptoms worsen or persist or new concerns arise.  Patient Instructions  BEFORE YOU LEAVE: -follow up: 4 weeks -labs  No dairy for 2 weeks.  Plenty of fluids with electrolytes (soup broth and or gatorade IF not eating).  Imodium as needed if any further diarrhea.  I hope you are feeling better soon! Follow up sooner if worsening, new concerns or you are not improving with treatment.  WE NOW OFFER   [facility] FAST TRACK!!!  SAME DAY Appointments for ACUTE CARE  Such as: Sprains, Injuries, cuts, abrasions, rashes, muscle pain, joint pain, back pain Colds, flu, sore throats, headache, allergies, cough, fever  Ear pain, sinus and eye infections Abdominal pain, nausea, vomiting, diarrhea, upset stomach Animal/insect bites  3 Easy Ways to Schedule: Walk-In Scheduling Call in scheduling Mychart Sign-up: [email or web address]              Colin Benton R., DO

## 2016-10-07 ENCOUNTER — Encounter: Payer: Self-pay | Admitting: Family Medicine

## 2016-10-07 ENCOUNTER — Ambulatory Visit (INDEPENDENT_AMBULATORY_CARE_PROVIDER_SITE_OTHER): Payer: Medicare HMO | Admitting: Family Medicine

## 2016-10-07 VITALS — BP 120/74 | HR 70 | Temp 97.9°F | Wt 198.6 lb

## 2016-10-07 DIAGNOSIS — R197 Diarrhea, unspecified: Secondary | ICD-10-CM | POA: Diagnosis not present

## 2016-10-07 DIAGNOSIS — J301 Allergic rhinitis due to pollen: Secondary | ICD-10-CM | POA: Diagnosis not present

## 2016-10-07 DIAGNOSIS — J3089 Other allergic rhinitis: Secondary | ICD-10-CM | POA: Diagnosis not present

## 2016-10-07 DIAGNOSIS — Z23 Encounter for immunization: Secondary | ICD-10-CM

## 2016-10-07 DIAGNOSIS — J3081 Allergic rhinitis due to animal (cat) (dog) hair and dander: Secondary | ICD-10-CM | POA: Diagnosis not present

## 2016-10-07 LAB — CBC
HEMATOCRIT: 40.6 % (ref 36.0–46.0)
Hemoglobin: 13.5 g/dL (ref 12.0–15.0)
MCHC: 33.2 g/dL (ref 30.0–36.0)
MCV: 94.2 fl (ref 78.0–100.0)
Platelets: 192 10*3/uL (ref 150.0–400.0)
RBC: 4.31 Mil/uL (ref 3.87–5.11)
RDW: 13.6 % (ref 11.5–15.5)
WBC: 5.9 10*3/uL (ref 4.0–10.5)

## 2016-10-07 LAB — BASIC METABOLIC PANEL
BUN: 11 mg/dL (ref 6–23)
CHLORIDE: 106 meq/L (ref 96–112)
CO2: 28 meq/L (ref 19–32)
CREATININE: 0.87 mg/dL (ref 0.40–1.20)
Calcium: 9.3 mg/dL (ref 8.4–10.5)
GFR: 68.68 mL/min (ref >60.00)
Glucose, Bld: 92 mg/dL (ref 70–99)
Potassium: 4.3 mEq/L (ref 3.5–5.1)
Sodium: 141 mEq/L (ref 135–145)

## 2016-10-07 NOTE — Patient Instructions (Signed)
BEFORE YOU LEAVE: -follow up: 4 weeks -labs  No dairy for 2 weeks.  Plenty of fluids with electrolytes (soup broth and or gatorade IF not eating).  Imodium as needed if any further diarrhea.  I hope you are feeling better soon! Follow up sooner if worsening, new concerns or you are not improving with treatment.  WE NOW OFFER   [facility] FAST TRACK!!!  SAME DAY Appointments for ACUTE CARE  Such as: Sprains, Injuries, cuts, abrasions, rashes, muscle pain, joint pain, back pain Colds, flu, sore throats, headache, allergies, cough, fever  Ear pain, sinus and eye infections Abdominal pain, nausea, vomiting, diarrhea, upset stomach Animal/insect bites  3 Easy Ways to Schedule: Walk-In Scheduling Call in scheduling Mychart Sign-up: [email or web address]

## 2016-10-07 NOTE — Addendum Note (Signed)
Addended by: Wyvonne Lenz on: 10/07/2016 09:02 AM   Modules accepted: Orders

## 2016-10-08 ENCOUNTER — Encounter: Payer: Self-pay | Admitting: Family Medicine

## 2016-10-09 LAB — OVA AND PARASITE EXAMINATION: OP: NONE SEEN

## 2016-10-09 LAB — CLOSTRIDIUM DIFFICILE BY PCR: CDIFFPCR: NOT DETECTED

## 2016-10-10 DIAGNOSIS — J3081 Allergic rhinitis due to animal (cat) (dog) hair and dander: Secondary | ICD-10-CM | POA: Diagnosis not present

## 2016-10-10 DIAGNOSIS — J3089 Other allergic rhinitis: Secondary | ICD-10-CM | POA: Diagnosis not present

## 2016-10-10 DIAGNOSIS — J301 Allergic rhinitis due to pollen: Secondary | ICD-10-CM | POA: Diagnosis not present

## 2016-10-12 LAB — STOOL CULTURE

## 2016-10-13 ENCOUNTER — Encounter: Payer: Self-pay | Admitting: Family Medicine

## 2016-10-13 ENCOUNTER — Ambulatory Visit (INDEPENDENT_AMBULATORY_CARE_PROVIDER_SITE_OTHER): Payer: Medicare HMO | Admitting: Family Medicine

## 2016-10-13 VITALS — BP 112/60 | HR 63 | Temp 97.6°F | Ht 65.0 in | Wt 196.9 lb

## 2016-10-13 DIAGNOSIS — R1084 Generalized abdominal pain: Secondary | ICD-10-CM

## 2016-10-13 DIAGNOSIS — E039 Hypothyroidism, unspecified: Secondary | ICD-10-CM | POA: Diagnosis not present

## 2016-10-13 DIAGNOSIS — R197 Diarrhea, unspecified: Secondary | ICD-10-CM | POA: Diagnosis not present

## 2016-10-13 MED ORDER — DICYCLOMINE HCL 10 MG PO CAPS: 10.0000 mg | ORAL_CAPSULE | Freq: Three times a day (TID) | ORAL | 0 refills | Status: DC

## 2016-10-13 NOTE — Patient Instructions (Addendum)
BEFORE YOU LEAVE: -orderd STAT CT to try to schedule in next 1 week - see notes in order - please provide pt with appt details if possible -follow up: 1 month  Please call you gastroenterologist today for appointment.  In interim get the CT scan, can try gas x (over the counter), and the bentyl (prescription sent.)  I hope you are feeling better soon! Seek care immediately if worsening, new concerns or you are not improving with treatment.

## 2016-10-13 NOTE — Progress Notes (Signed)
HPI:  Beverly Ballard is a pleasant 71 yo with PMH significant for known chronic depression, thyroid disease and recent diarrhea here for follow up. She has had some diarrhea for about 3 weeks. Other factors include mood poor since returned from home country as wishes she could live there instead and thyroid overtreated and her endocrinologist was not returning her calls so we recently adjusted. She had extensive labs 1 week ago with cbc, bmp, stool culture, c. Diff, o and p stool all negative.  She has a hx of upset stomach and saw GI last here. She reports persistent diarrhea - though now 1-2 times per day and loose. No mucus, blood or melena. Reports a lot of cramping difusely and feels better after a BM. She can not give up dairy - " I just can not." Imodium helps. Has lots of flatulence.  Taking new does of synthroid. Wants to do CT. Wt 196 in March, 196 today.    ROS: See pertinent positives and negatives per HPI.  Past Medical History:  Diagnosis Date  . ALLERGIC RHINITIS 05/27/2007   uses Flonase daily as needed and ALbuterol inhaler prn;gets Allergy shots  . Allergy   . Anxiety and depression 12/19/2009  . Arthritis   . Cancer (HCC)    vaginal  . Constipation   . Depression   . Diverticulosis   . Environmental allergies   . Essential hypertension, benign 06/26/2009  . GERD 05/27/2007   takes Nexium daily  . Heart murmur    yrs ago  . History of gout   . History of migraine many yrs ago  . Hyperlipidemia    takes Pravastatin daily  . Hypothyroidism   . Insomnia    but doesn't take any meds  . LOW BACK PAIN 05/27/2007  . Numbness    left toes   . Osteoporosis   . Seizure disorder [facility])    reports remote, takes Tegretol daily, followed by neurology  . Seizures ([place])    last 1983, none since then    Past Surgical History:  Procedure Laterality Date  . ADENOIDECTOMY    . BACK SURGERY     x2  . COLONOSCOPY    . ESOPHAGOGASTRODUODENOSCOPY    . Heel spurs Bilateral   .  KNEE ARTHROPLASTY Left 11/07/2013   Procedure: COMPUTER ASSISTED TOTAL KNEE ARTHROPLASTY;  Surgeon: Marybelle Killings, MD;  Location: [facility];  Service: Orthopedics;  Laterality: Left;  Left Total Knee Arthroplasty, Cemented, Computer Assist  . TONSILLECTOMY    . TOTAL KNEE REVISION Left 09/19/2014   Procedure: LEFT TOTAL KNEE REVISION;  Surgeon: Leandrew Koyanagi, MD;  Location: [place];  Service: Orthopedics;  Laterality: Left;    Family History  Problem Relation Age of Onset  . Arthritis Mother   . Heart murmur Other   . Colon cancer Neg Hx   . Esophageal cancer Neg Hx   . Stomach cancer Neg Hx   . Rectal cancer Neg Hx     Social History   Social History  . Marital status: Married    Spouse name: N/A  . Number of children: 1  . Years of education: N/A   Occupational History  . Retired    Social History Main Topics  . Smoking status: Former Smoker    Packs/day: 0.25    Years: 20.00    Types: Cigarettes    Quit date: 09/08/1978  . Smokeless tobacco: Never Used     Comment: quit smokikng 28yrs ago  .  Alcohol use No  . Drug use: No  . Sexual activity: No   Other Topics Concern  . None   Social History Narrative  . None     Current Outpatient Prescriptions:  .  albuterol (PROVENTIL HFA;VENTOLIN HFA) 108 (90 BASE) MCG/ACT inhaler, Inhale 1 puff into the lungs every 6 (six) hours as needed for wheezing or shortness of breath., Disp: , Rfl:  .  aspirin 81 MG tablet, Take 81 mg by mouth daily., Disp: , Rfl:  .  beta carotene w/minerals (OCUVITE) tablet, Take 1 tablet by mouth daily., Disp: , Rfl:  .  Cyanocobalamin (VITAMIN B 12 PO), Take by mouth daily.  , Disp: , Rfl:  .  cyclobenzaprine (FLEXERIL) 5 MG tablet, Take 1 tablet (5 mg total) by mouth at bedtime as needed for muscle spasms., Disp: 30 tablet, Rfl: 0 .  esomeprazole (NEXIUM) 20 MG capsule, Take 20 mg by mouth daily before breakfast., Disp: , Rfl:  .  fluticasone (FLONASE) 50 MCG/ACT nasal spray, Place 2 sprays into both  nostrils daily., Disp: 16 g, Rfl: 6 .  levothyroxine (SYNTHROID, LEVOTHROID) 137 MCG tablet, Take 1 tablet (137 mcg total) by mouth daily before breakfast., Disp: 60 tablet, Rfl: 1 .  lisinopril (PRINIVIL,ZESTRIL) 5 MG tablet, Take 1 tablet (5 mg total) by mouth daily., Disp: 90 tablet, Rfl: 1 .  Multiple Vitamin (MULTIVITAMIN) capsule, Take 1 capsule by mouth daily.  , Disp: , Rfl:  .  Omega-3 Fatty Acids (FISH OIL PO), Take by mouth daily.  , Disp: , Rfl:  .  ondansetron (ZOFRAN) 4 MG tablet, Take 1 tablet (4 mg total) by mouth every 8 (eight) hours as needed for nausea or vomiting., Disp: 30 tablet, Rfl: 1 .  polyethylene glycol powder (GLYCOLAX/MIRALAX) powder, MIX 17 GRAMS DAILY AS DIRECTED, Disp: 255 g, Rfl: 0 .  pravastatin (PRAVACHOL) 20 MG tablet, TAKE 1 TABLET(20 MG) BY MOUTH DAILY, Disp: 90 tablet, Rfl: 1 .  senna-docusate (SENOKOT S) 8.6-50 MG per tablet, Take 1 tablet by mouth at bedtime as needed., Disp: 30 tablet, Rfl: 1 .  sertraline (ZOLOFT) 100 MG tablet, Take 1 tablet (100 mg total) by mouth daily. (Patient taking differently: Take 125 mg by mouth daily. ), Disp: 90 tablet, Rfl: 1 .  sertraline (ZOLOFT) 25 MG tablet, Take 25 mg by mouth daily., Disp: , Rfl:  .  zonisamide (ZONEGRAN) 100 MG capsule, Take 200 mg by mouth 2 (two) times daily., Disp: , Rfl:  .  dicyclomine (BENTYL) 10 MG capsule, Take 1 capsule (10 mg total) by mouth 3 (three) times daily before meals., Disp: 90 capsule, Rfl: 0  EXAM:  Vitals:   10/13/16 1258  BP: 112/60  Pulse: 63  Temp: 97.6 F (36.4 C)    Body mass index is 32.77 kg/m.  GENERAL: vitals reviewed and listed above, alert, oriented, appears well hydrated and in no acute distress  HEENT: atraumatic, conjunttiva clear, no obvious abnormalities on inspection of external nose and ears  NECK: no obvious masses on inspection  LUNGS: clear to auscultation bilaterally, no wheezes, rales or rhonchi, good air movement  CV: HRRR, no peripheral  edema  ABD: BS+, soft, NTTP  MS: moves all extremities without noticeable abnormality  PSYCH: pleasant and cooperative, no obvious depression or anxiety  ASSESSMENT AND PLAN:  Discussed the following assessment and plan:  Diarrhea, unspecified type - Plan: CT Abdomen Pelvis W Contrast Generalized abdominal pain - Plan: CT Abdomen Pelvis W Contrast -we discussed possible serious  and likely etiologies, workup and treatment, treatment risks and return precautions. Exam is benign. Wt is the same as it was in March. -after this discussion, Candra opted for GI eval (she will call to schedule), will check CT as she is very distraught, reports pain is severe at times. Also will try gasx and if that doesn't help bentyl. -follow up advised in 1 month. -of course, we advised [place]  to return or notify a doctor immediately if symptoms worsen or persist or new concerns arise.  Hypothyroidism, unspecified type -recent adjustment in medication, advise tsh recheck in a few weeks  -Patient advised to return or notify a doctor immediately if symptoms worsen or persist or new concerns arise.  Patient Instructions  BEFORE YOU LEAVE: -orderd STAT CT to try to schedule in next 1 week - see notes in order - please provide pt with appt details if possible -follow up: 1 month  Please call you gastroenterologist today for appointment.  In interim get the CT scan, can try gas x (over the counter), and the bentyl (prescription sent.)  I hope you are feeling better soon! Seek care immediately if worsening, new concerns or you are not improving with treatment.      Colin Benton R., DO

## 2016-10-14 ENCOUNTER — Ambulatory Visit (INDEPENDENT_AMBULATORY_CARE_PROVIDER_SITE_OTHER)
Admission: RE | Admit: 2016-10-14 | Discharge: 2016-10-14 | Disposition: A | Discharge status: Home / Self Care | Payer: Medicare HMO | Source: Ambulatory Visit | Attending: Family Medicine | Admitting: Family Medicine

## 2016-10-14 ENCOUNTER — Other Ambulatory Visit: Payer: Self-pay | Admitting: *Deleted

## 2016-10-14 DIAGNOSIS — R197 Diarrhea, unspecified: Secondary | ICD-10-CM

## 2016-10-14 DIAGNOSIS — R1084 Generalized abdominal pain: Secondary | ICD-10-CM

## 2016-10-14 DIAGNOSIS — N83201 Unspecified ovarian cyst, right side: Secondary | ICD-10-CM

## 2016-10-14 DIAGNOSIS — R109 Unspecified abdominal pain: Secondary | ICD-10-CM | POA: Diagnosis not present

## 2016-10-14 MED ORDER — IOPAMIDOL (ISOVUE-370) INJECTION 76%
100.0000 mL | Freq: Once | INTRAVENOUS | Status: AC | PRN
  Administered 2016-10-14: 100 mL via INTRAVENOUS

## 2016-10-15 ENCOUNTER — Other Ambulatory Visit: Payer: Self-pay | Admitting: *Deleted

## 2016-10-15 DIAGNOSIS — N83201 Unspecified ovarian cyst, right side: Secondary | ICD-10-CM

## 2016-10-20 ENCOUNTER — Ambulatory Visit: Payer: Self-pay | Admitting: Family Medicine

## 2016-10-27 ENCOUNTER — Ambulatory Visit
Admission: RE | Admit: 2016-10-27 | Discharge: 2016-10-27 | Disposition: A | Discharge status: Home / Self Care | Payer: Medicare HMO | Source: Ambulatory Visit | Attending: Family Medicine | Admitting: Family Medicine

## 2016-10-27 DIAGNOSIS — N83201 Unspecified ovarian cyst, right side: Secondary | ICD-10-CM

## 2016-10-28 DIAGNOSIS — J301 Allergic rhinitis due to pollen: Secondary | ICD-10-CM | POA: Diagnosis not present

## 2016-10-28 DIAGNOSIS — J3081 Allergic rhinitis due to animal (cat) (dog) hair and dander: Secondary | ICD-10-CM | POA: Diagnosis not present

## 2016-10-28 DIAGNOSIS — J3089 Other allergic rhinitis: Secondary | ICD-10-CM | POA: Diagnosis not present

## 2016-10-30 ENCOUNTER — Encounter: Payer: Self-pay | Admitting: Family Medicine

## 2016-10-30 ENCOUNTER — Other Ambulatory Visit: Payer: Self-pay | Admitting: *Deleted

## 2016-10-30 DIAGNOSIS — N83209 Unspecified ovarian cyst, unspecified side: Secondary | ICD-10-CM | POA: Insufficient documentation

## 2016-10-31 NOTE — Progress Notes (Signed)
Pre visit review using our clinic review tool, if applicable. No additional management support is needed unless otherwise documented below in the visit note. 

## 2016-10-31 NOTE — Progress Notes (Signed)
Subjective:   Beverly Ballard is a 71 y.o. female who presents for Medicare Annual (Subsequent) preventive examination.  Review of Systems:  No ROS.  Medicare Wellness Visit. Additional risk factors are reflected in the social history.  Cardiac Risk Factors include: advanced age (>41men, >45 women);dyslipidemia;obesity (BMI >30kg/m2)   Sleep patterns: Sleeps 6 hours, feels rested. Up to void x 2.   Home Safety/Smoke Alarms: Feels safe in home. Smoke alarms in place.  Living environment; residence and Firearm Safety:Lives with husband in 1 story home.  Seat Belt Safety/Bike Helmet: Wears seat belt.   Counseling:   Eye-Last exam 2018, yearly [facility]-Last exam 09/2016, every 6 months. Dr. Tanya Nones  Female:   [id number]      Mammo-02/20/2016  Negative. Followed by GYN Dexa scan-04/04/2016  Osteopenic. Followed by GYN [facility]-07/09/2007 10 year recall. Appt with GI next month.      Objective:     Vitals: BP (!) 106/54 (BP Location: Left Arm, Patient Position: Sitting, Cuff Size: Normal)   Pulse 73   Ht 5\' 5"  (1.651 m)   Wt 196 lb 8 oz (89.1 kg)   SpO2 96%   BMI 32.70 kg/m   Body mass index is 32.7 kg/m.   Tobacco History  Smoking Status  . Former Smoker  . Packs/day: 0.25  . Years: 20.00  . Types: Cigarettes  . Quit date: 09/08/1978  Smokeless Tobacco  . Never Used    Comment: quit smokikng 59yrs ago     Counseling given: No   Past Medical History:  Diagnosis Date  . ALLERGIC RHINITIS 05/27/2007   uses Flonase daily as needed and ALbuterol inhaler prn;gets Allergy shots  . Allergy   . Anxiety and depression 12/19/2009  . Arthritis   . Cancer (HCC)    vaginal  . Constipation   . Depression   . Diverticulosis   . Environmental allergies   . Essential hypertension, benign 06/26/2009  . GERD 05/27/2007   takes Nexium daily  . Heart murmur    yrs ago  . History of gout   . History of migraine many yrs ago  . Hyperlipidemia    takes Pravastatin daily    . Hypothyroidism   . Insomnia    but doesn't take any meds  . LOW BACK PAIN 05/27/2007  . Numbness    left toes   . Osteoporosis   . Seizure disorder [facility])    reports remote, takes Tegretol daily, followed by neurology  . Seizures ([place])    last 1983, none since then   Past Surgical History:  Procedure Laterality Date  . ADENOIDECTOMY    . BACK SURGERY     x2  . COLONOSCOPY    . ESOPHAGOGASTRODUODENOSCOPY    . Heel spurs Bilateral   . KNEE ARTHROPLASTY Left 11/07/2013   Procedure: COMPUTER ASSISTED TOTAL KNEE ARTHROPLASTY;  Surgeon: Marybelle Killings, MD;  Location: [place];  Service: Orthopedics;  Laterality: Left;  Left Total Knee Arthroplasty, Cemented, Computer Assist  . TONSILLECTOMY    . TOTAL KNEE REVISION Left 09/19/2014   Procedure: LEFT TOTAL KNEE REVISION;  Surgeon: Leandrew Koyanagi, MD;  Location: [place];  Service: Orthopedics;  Laterality: Left;   Family History  Problem Relation Age of Onset  . Arthritis Mother   . Stroke Mother   . Heart murmur Other   . Colon cancer Neg Hx   . Esophageal cancer Neg Hx   . Stomach cancer Neg Hx   . Rectal  cancer Neg Hx    History  Sexual Activity  . Sexual activity: No    Outpatient Encounter Prescriptions as of 11/07/2016  Medication Sig  . albuterol (PROVENTIL HFA;VENTOLIN HFA) 108 (90 BASE) MCG/ACT inhaler Inhale 1 puff into the lungs every 6 (six) hours as needed for wheezing or shortness of breath.  Marland Kitchen aspirin 81 MG tablet Take 81 mg by mouth daily.  . beta carotene w/minerals (OCUVITE) tablet Take 1 tablet by mouth daily.  . Cyanocobalamin (VITAMIN B 12 PO) Take by mouth daily.    . cyclobenzaprine (FLEXERIL) 5 MG tablet Take 1 tablet (5 mg total) by mouth at bedtime as needed for muscle spasms.  Marland Kitchen dicyclomine (BENTYL) 10 MG capsule Take 1 capsule (10 mg total) by mouth 3 (three) times daily before meals.  Marland Kitchen esomeprazole (NEXIUM) 20 MG capsule Take 20 mg by mouth daily before breakfast.  . fluticasone (FLONASE) 50 MCG/ACT nasal  spray Place 2 sprays into both nostrils daily.  Marland Kitchen levocetirizine (XYZAL) 5 MG tablet   . levothyroxine (SYNTHROID, LEVOTHROID) 137 MCG tablet Take 1 tablet (137 mcg total) by mouth daily before breakfast.  . lisinopril (PRINIVIL,ZESTRIL) 5 MG tablet Take 1 tablet (5 mg total) by mouth daily.  . Multiple Vitamin (MULTIVITAMIN) capsule Take 1 capsule by mouth daily.    . Omega-3 Fatty Acids (FISH OIL PO) Take by mouth daily.    . ondansetron (ZOFRAN) 4 MG tablet Take 1 tablet (4 mg total) by mouth every 8 (eight) hours as needed for nausea or vomiting.  . polyethylene glycol powder (GLYCOLAX/MIRALAX) powder MIX 17 GRAMS DAILY AS DIRECTED  . pravastatin (PRAVACHOL) 20 MG tablet TAKE 1 TABLET(20 MG) BY MOUTH DAILY  . senna-docusate (SENOKOT S) 8.6-50 MG per tablet Take 1 tablet by mouth at bedtime as needed.  . sertraline (ZOLOFT) 100 MG tablet Take 1 tablet (100 mg total) by mouth daily. (Patient taking differently: Take 125 mg by mouth daily. )  . sertraline (ZOLOFT) 25 MG tablet Take 25 mg by mouth daily.  Marland Kitchen zonisamide (ZONEGRAN) 100 MG capsule Take 200 mg by mouth 2 (two) times daily.  Marland Kitchen Zoster Vac Recomb Adjuvanted [facility]) injection Inject 0.5 mLs into the muscle once.   No facility-administered encounter medications on file as of 11/07/2016.     Activities of Daily Living In your present state of health, do you have any difficulty performing the following activities: 11/07/2016  Hearing? N  Vision? N  Difficulty concentrating or making decisions? N  Walking or climbing stairs? N  Dressing or bathing? N  Doing errands, shopping? N  Preparing Food and eating ? N  Using the Toilet? N  In the past six months, have you accidently leaked urine? Y  Do you have problems with loss of bowel control? N  Managing your Medications? N  Managing your Finances? N  Housekeeping or managing your Housekeeping? N  Some recent data might be hidden    Patient Care Team: Lucretia Kern, DO as PCP -  General (Family Medicine) Minus Breeding, MD as Consulting Physician (Cardiology) Azucena Fallen, MD as Consulting Physician (Obstetrics and Gynecology) Kirtland Bouchard, PhD as Consulting Physician (Psychology) Donnal Moat (Psychiatry) Armbruster, Renelda Loma, MD as Consulting Physician (Gastroenterology)    Assessment:    Physical assessment deferred to PCP.  Exercise Activities and Dietary recommendations Current Exercise Habits: The patient does not participate in regular exercise at present;Home exercise routine (house work), Exercise limited by: orthopedic condition(s)   Diet (meal preparation, eat  out, water intake, caffeinated beverages, dairy products, fruits and vegetables): Drinks water, ginger-ale, tea  Breakfast: scone, juice, tea Lunch: snacks Dinner: protein, vegetables.   Patient cooks majority of meals from scratch, encouraged to increase activity.   Goals      Patient Stated   . patient (pt-stated)          Increase activity and be more active with volunteering.       Fall Risk Fall Risk  11/07/2016 11/01/2015 04/03/2015 01/24/2014 01/24/2014  Falls in the past year? No No No No No  Risk for fall due to : - - - - History of fall(s)   Depression Screen PHQ 2/9 Scores 11/07/2016 11/06/2016 11/01/2015 04/03/2015  PHQ - 2 Score 1 1 0 0    Patient states she is not happy in general. Reports being happy when she traveled home alone to spend time with her mother and siblings. States she follows up with psychology and psychiatry.   Cognitive Function       Ad8 score reviewed for issues:  Issues making decisions: no  Less interest in hobbies / activities: no  Repeats questions, stories (family complaining): no  Trouble using ordinary gadgets (microwave, computer, phone): no  Forgets the month or year: no  Mismanaging finances:  no  Remembering appts: no  Daily problems with thinking and/or memory: no Ad8 score is=0     Immunization History    Administered Date(s) Administered  . Influenza Whole 01/19/2009, 12/19/2009  . Influenza, High Dose Seasonal PF 01/08/2015, 01/03/2016  . Influenza,inj,Quad PF,36+ Mos 01/24/2014  . Influenza-Unspecified 01/06/2012  . Pneumococcal Conjugate-13 09/19/2009  . Pneumococcal Polysaccharide-23 06/20/2013  . Tdap 05/30/2014  . Varicella 09/23/2010   Screening Tests Health Maintenance  Topic Date Due  . INFLUENZA VACCINE  11/19/2016  . COLONOSCOPY  07/08/2017  . MAMMOGRAM  02/19/2018  . TETANUS/TDAP  05/30/2024  . DEXA SCAN  Completed  . Hepatitis C Screening  Completed  . PNA vac Low Risk Adult  Completed   Shingrix Vaccine prescription sent to pharmacy.      Plan:     Bring a copy of your advance directives to your next office visit.  Continue doing brain stimulating activities (puzzles, reading, adult coloring books, staying active) to keep memory sharp.   Shingrix vaccine at pharmacy.   I have personally reviewed and noted the following in the patient's chart:   . Medical and social history . Use of alcohol, tobacco or illicit drugs  . Current medications and supplements . Functional ability and status . Nutritional status . Physical activity . Advanced directives . List of other physicians . Vitals . Screenings to include cognitive, depression, and falls . Referrals and appointments  In addition, I have reviewed and discussed with patient certain preventive protocols, quality metrics, and best practice recommendations. A written personalized care plan for preventive services as well as general preventive health recommendations were provided to patient.     Gerilyn Nestle, RN  11/07/2016

## 2016-11-03 ENCOUNTER — Ambulatory Visit (INDEPENDENT_AMBULATORY_CARE_PROVIDER_SITE_OTHER): Payer: Medicare HMO | Admitting: Psychology

## 2016-11-03 DIAGNOSIS — F411 Generalized anxiety disorder: Secondary | ICD-10-CM

## 2016-11-03 DIAGNOSIS — R69 Illness, unspecified: Secondary | ICD-10-CM | POA: Diagnosis not present

## 2016-11-03 DIAGNOSIS — F330 Major depressive disorder, recurrent, mild: Secondary | ICD-10-CM | POA: Diagnosis not present

## 2016-11-05 NOTE — Progress Notes (Signed)
HPI:  Follow up diarrhea, ovarian cyst, worsened mood, abnormal tft. -CT ok except ovarian cyst -she saw gyn, had [place], ok - with 1 year f/u advised -she has appt with GI (has hx GI issues and likely IBS) -due for thyroid level recheck -reports: cut out dairy and with bentyl symptoms have resolved, she is considering canceling GI appt as feels well -denies: diarrhea, abd pain, fevers, malaise  MDD/fatigue: -seeing Dr. Glennon Hamilton and this is helping -admits mood is poor though 2ndary to her wanting to move back to her country but feels this is not possible -main symptom now in anhedonia - trying to address this by getting out more, signed up to give rides to cancer pts, feels Dr. Glennon Hamilton is really helping with this -denies plan or intention for harm to self or others -see PHQ9 -due for thyroid recheck  Has AWV with Manuela Schwartz tomorrow.  ROS: See pertinent positives and negatives per HPI.  Past Medical History:  Diagnosis Date  . ALLERGIC RHINITIS 05/27/2007   uses Flonase daily as needed and ALbuterol inhaler prn;gets Allergy shots  . Allergy   . Anxiety and depression 12/19/2009  . Arthritis   . Cancer (HCC)    vaginal  . Constipation   . Depression   . Diverticulosis   . Environmental allergies   . Essential hypertension, benign 06/26/2009  . GERD 05/27/2007   takes Nexium daily  . Heart murmur    yrs ago  . History of gout   . History of migraine many yrs ago  . Hyperlipidemia    takes Pravastatin daily  . Hypothyroidism   . Insomnia    but doesn't take any meds  . LOW BACK PAIN 05/27/2007  . Numbness    left toes   . Osteoporosis   . Seizure disorder [facility])    reports remote, takes Tegretol daily, followed by neurology  . Seizures ([place])    last 1983, none since then    Past Surgical History:  Procedure Laterality Date  . ADENOIDECTOMY    . BACK SURGERY     x2  . COLONOSCOPY    . ESOPHAGOGASTRODUODENOSCOPY    . Heel spurs Bilateral   . KNEE ARTHROPLASTY Left 11/07/2013   Procedure: COMPUTER ASSISTED TOTAL KNEE ARTHROPLASTY;  Surgeon: Marybelle Killings, MD;  Location: [place];  Service: Orthopedics;  Laterality: Left;  Left Total Knee Arthroplasty, Cemented, Computer Assist  . TONSILLECTOMY    . TOTAL KNEE REVISION Left 09/19/2014   Procedure: LEFT TOTAL KNEE REVISION;  Surgeon: Leandrew Koyanagi, MD;  Location: [place];  Service: Orthopedics;  Laterality: Left;    Family History  Problem Relation Age of Onset  . Arthritis Mother   . Heart murmur Other   . Colon cancer Neg Hx   . Esophageal cancer Neg Hx   . Stomach cancer Neg Hx   . Rectal cancer Neg Hx     Social History   Social History  . Marital status: Married    Spouse name: N/A  . Number of children: 1  . Years of education: N/A   Occupational History  . Retired    Social History Main Topics  . Smoking status: Former Smoker    Packs/day: 0.25    Years: 20.00    Types: Cigarettes    Quit date: 09/08/1978  . Smokeless tobacco: Never Used     Comment: quit smokikng 15yrs ago  . Alcohol use No  . Drug use: No  . Sexual activity: No  Other Topics Concern  . None   Social History Narrative  . None     Current Outpatient Prescriptions:  .  albuterol (PROVENTIL HFA;VENTOLIN HFA) 108 (90 BASE) MCG/ACT inhaler, Inhale 1 puff into the lungs every 6 (six) hours as needed for wheezing or shortness of breath., Disp: , Rfl:  .  aspirin 81 MG tablet, Take 81 mg by mouth daily., Disp: , Rfl:  .  beta carotene w/minerals (OCUVITE) tablet, Take 1 tablet by mouth daily., Disp: , Rfl:  .  Cyanocobalamin (VITAMIN B 12 PO), Take by mouth daily.  , Disp: , Rfl:  .  cyclobenzaprine (FLEXERIL) 5 MG tablet, Take 1 tablet (5 mg total) by mouth at bedtime as needed for muscle spasms., Disp: 30 tablet, Rfl: 0 .  dicyclomine (BENTYL) 10 MG capsule, Take 1 capsule (10 mg total) by mouth 3 (three) times daily before meals., Disp: 90 capsule, Rfl: 0 .  esomeprazole (NEXIUM) 20 MG capsule, Take 20 mg by mouth daily before  breakfast., Disp: , Rfl:  .  fluticasone (FLONASE) 50 MCG/ACT nasal spray, Place 2 sprays into both nostrils daily., Disp: 16 g, Rfl: 6 .  levothyroxine (SYNTHROID, LEVOTHROID) 137 MCG tablet, Take 1 tablet (137 mcg total) by mouth daily before breakfast., Disp: 60 tablet, Rfl: 1 .  lisinopril (PRINIVIL,ZESTRIL) 5 MG tablet, Take 1 tablet (5 mg total) by mouth daily., Disp: 90 tablet, Rfl: 1 .  Multiple Vitamin (MULTIVITAMIN) capsule, Take 1 capsule by mouth daily.  , Disp: , Rfl:  .  Omega-3 Fatty Acids (FISH OIL PO), Take by mouth daily.  , Disp: , Rfl:  .  ondansetron (ZOFRAN) 4 MG tablet, Take 1 tablet (4 mg total) by mouth every 8 (eight) hours as needed for nausea or vomiting., Disp: 30 tablet, Rfl: 1 .  polyethylene glycol powder (GLYCOLAX/MIRALAX) powder, MIX 17 GRAMS DAILY AS DIRECTED, Disp: 255 g, Rfl: 0 .  pravastatin (PRAVACHOL) 20 MG tablet, TAKE 1 TABLET(20 MG) BY MOUTH DAILY, Disp: 90 tablet, Rfl: 1 .  senna-docusate (SENOKOT S) 8.6-50 MG per tablet, Take 1 tablet by mouth at bedtime as needed., Disp: 30 tablet, Rfl: 1 .  sertraline (ZOLOFT) 100 MG tablet, Take 1 tablet (100 mg total) by mouth daily. (Patient taking differently: Take 125 mg by mouth daily. ), Disp: 90 tablet, Rfl: 1 .  sertraline (ZOLOFT) 25 MG tablet, Take 25 mg by mouth daily., Disp: , Rfl:  .  zonisamide (ZONEGRAN) 100 MG capsule, Take 200 mg by mouth 2 (two) times daily., Disp: , Rfl:   EXAM:  Vitals:   11/06/16 1049  BP: 110/74  Pulse: 65  Temp: 98.1 F (36.7 C)    Body mass index is 32.68 kg/m.  GENERAL: vitals reviewed and listed above, alert, oriented, appears well hydrated and in no acute distress  HEENT: atraumatic, conjunttiva clear, no obvious abnormalities on inspection of external nose and ears  NECK: no obvious masses on inspection  LUNGS: clear to auscultation bilaterally, no wheezes, rales or rhonchi, good air movement  CV: HRRR, no peripheral edema  MS: moves all extremities  without noticeable abnormality  PSYCH: pleasant and cooperative, no obvious depression or anxiety  ASSESSMENT AND PLAN:  Discussed the following assessment and plan:  Diarrhea, unspecified type  Fatigue, unspecified type  Recurrent major depressive disorder, in partial remission (HCC)  Hypothyroidism, unspecified type - Plan: TSH  Screening for depression  -glad gi symptoms resolved - likely IBS, continue dietary changes and bentyl as needed -follow up  with gyn as planned -recheck TSH -counseled and supported for at least 8 minutes regarding depression, encouraged continued CBT, getting out, increasing exercise, continue medication -follow up 3-4 months -AWV with Manuela Schwartz tomorrow -Patient advised to return or notify a doctor immediately if symptoms worsen or persist or new concerns arise.  Patient Instructions  BEFORE YOU LEAVE: -PHQ9 - with code -follow up: keep medicare exam with Manuela Schwartz -follow up with Dr. Maudie Mercury in 3-4 months -lab for thyroid check  Continue to see Dr. Glennon Hamilton.  I am glad you will be getting out more.  Slowly increase aerobic exercise.  Bentyl as needed.      Colin Benton R., DO

## 2016-11-06 ENCOUNTER — Ambulatory Visit (INDEPENDENT_AMBULATORY_CARE_PROVIDER_SITE_OTHER): Payer: Medicare HMO | Admitting: Family Medicine

## 2016-11-06 ENCOUNTER — Encounter: Payer: Self-pay | Admitting: Family Medicine

## 2016-11-06 VITALS — BP 110/74 | HR 65 | Temp 98.1°F | Ht 65.0 in | Wt 196.4 lb

## 2016-11-06 DIAGNOSIS — R197 Diarrhea, unspecified: Secondary | ICD-10-CM

## 2016-11-06 DIAGNOSIS — J301 Allergic rhinitis due to pollen: Secondary | ICD-10-CM | POA: Diagnosis not present

## 2016-11-06 DIAGNOSIS — R5383 Other fatigue: Secondary | ICD-10-CM

## 2016-11-06 DIAGNOSIS — F3341 Major depressive disorder, recurrent, in partial remission: Secondary | ICD-10-CM | POA: Diagnosis not present

## 2016-11-06 DIAGNOSIS — R69 Illness, unspecified: Secondary | ICD-10-CM | POA: Diagnosis not present

## 2016-11-06 DIAGNOSIS — Z1389 Encounter for screening for other disorder: Secondary | ICD-10-CM

## 2016-11-06 DIAGNOSIS — E039 Hypothyroidism, unspecified: Secondary | ICD-10-CM

## 2016-11-06 DIAGNOSIS — J3081 Allergic rhinitis due to animal (cat) (dog) hair and dander: Secondary | ICD-10-CM | POA: Diagnosis not present

## 2016-11-06 DIAGNOSIS — Z1331 Encounter for screening for depression: Secondary | ICD-10-CM

## 2016-11-06 DIAGNOSIS — J3089 Other allergic rhinitis: Secondary | ICD-10-CM | POA: Diagnosis not present

## 2016-11-06 LAB — TSH: TSH: 0.11 u[IU]/mL — AB (ref 0.35–4.50)

## 2016-11-06 NOTE — Patient Instructions (Addendum)
BEFORE YOU LEAVE: -PHQ9 - with code -follow up: keep medicare exam with Manuela Schwartz -follow up with Dr. Maudie Mercury in 3-4 months -lab for thyroid check  Continue to see Dr. Glennon Hamilton.  I am glad you will be getting out more.  Slowly increase aerobic exercise.  Bentyl as needed.

## 2016-11-07 ENCOUNTER — Ambulatory Visit (INDEPENDENT_AMBULATORY_CARE_PROVIDER_SITE_OTHER): Payer: Medicare HMO

## 2016-11-07 ENCOUNTER — Other Ambulatory Visit: Payer: Medicare HMO

## 2016-11-07 VITALS — BP 106/54 | HR 73 | Ht 65.0 in | Wt 196.5 lb

## 2016-11-07 DIAGNOSIS — Z0000 Encounter for general adult medical examination without abnormal findings: Secondary | ICD-10-CM | POA: Diagnosis not present

## 2016-11-07 DIAGNOSIS — Z23 Encounter for immunization: Secondary | ICD-10-CM | POA: Diagnosis not present

## 2016-11-07 MED ORDER — ZOSTER VAC RECOMB ADJUVANTED 50 MCG/0.5ML IM SUSR: 0.5000 mL | Freq: Once | INTRAMUSCULAR | 1 refills | Status: AC

## 2016-11-07 NOTE — Progress Notes (Signed)
KIM, HANNAH R., DO  

## 2016-11-07 NOTE — Patient Instructions (Addendum)
Bring a copy of your advance directives to your next office visit.  Continue doing brain stimulating activities (puzzles, reading, adult coloring books, staying active) to keep memory sharp.   Shingrix vaccine at pharmacy.   Health Maintenance, Female Adopting a healthy lifestyle and getting preventive care can go a long way to promote health and wellness. Talk with your health care provider about what schedule of regular examinations is right for you. This is a good chance for you to check in with your provider about disease prevention and staying healthy. In between checkups, there are plenty of things you can do on your own. Experts have done a lot of research about which lifestyle changes and preventive measures are most likely to keep you healthy. Ask your health care provider for more information. Weight and diet Eat a healthy diet  Be sure to include plenty of vegetables, fruits, low-fat dairy products, and lean protein.  Do not eat a lot of foods high in solid fats, added sugars, or salt.  Get regular exercise. This is one of the most important things you can do for your health. ? Most adults should exercise for at least 150 minutes each week. The exercise should increase your heart rate and make you sweat (moderate-intensity exercise). ? Most adults should also do strengthening exercises at least twice a week. This is in addition to the moderate-intensity exercise.  Maintain a healthy weight  Body mass index (BMI) is a measurement that can be used to identify possible weight problems. It estimates body fat based on height and weight. Your health care provider can help determine your BMI and help you achieve or maintain a healthy weight.  For females 64 years of age and older: ? A BMI below 18.5 is considered underweight. ? A BMI of 18.5 to 24.9 is normal. ? A BMI of 25 to 29.9 is considered overweight. ? A BMI of 30 and above is considered obese.  Watch levels of cholesterol  and blood lipids  You should start having your blood tested for lipids and cholesterol at 69 years of age, then have this test every 5 years.  You may need to have your cholesterol levels checked more often if: ? Your lipid or cholesterol levels are high. ? You are older than 69 years of age. ? You are at high risk for heart disease.  Cancer screening Lung Cancer  Lung cancer screening is recommended for adults 72-64 years old who are at high risk for lung cancer because of a history of smoking.  A yearly low-dose CT scan of the lungs is recommended for people who: ? Currently smoke. ? Have quit within the past 15 years. ? Have at least a 30-pack-year history of smoking. A pack year is smoking an average of one pack of cigarettes a day for 1 year.  Yearly screening should continue until it has been 15 years since you quit.  Yearly screening should stop if you develop a health problem that would prevent you from having lung cancer treatment.  Breast Cancer  Practice breast self-awareness. This means understanding how your breasts normally appear and feel.  It also means doing regular breast self-exams. Let your health care provider know about any changes, no matter how small.  If you are in your 20s or 30s, you should have a clinical breast exam (CBE) by a health care provider every 1-3 years as part of a regular health exam.  If you are 71 or older, have a CBE  every year. Also consider having a breast X-ray (mammogram) every year.  If you have a family history of breast cancer, talk to your health care provider about genetic screening.  If you are at high risk for breast cancer, talk to your health care provider about having an MRI and a mammogram every year.  Breast cancer gene (BRCA) assessment is recommended for women who have family members with BRCA-related cancers. BRCA-related cancers include: ? Breast. ? Ovarian. ? Tubal. ? Peritoneal cancers.  Results of the  assessment will determine the need for genetic counseling and BRCA1 and BRCA2 testing.  Cervical Cancer Your health care provider may recommend that you be screened regularly for cancer of the pelvic organs (ovaries, uterus, and vagina). This screening involves a pelvic examination, including checking for microscopic changes to the surface of your cervix (Pap test). You may be encouraged to have this screening done every 3 years, beginning at age 98.  For women ages 45-65, health care providers may recommend pelvic exams and Pap testing every 3 years, or they may recommend the Pap and pelvic exam, combined with testing for human papilloma virus (HPV), every 5 years. Some types of HPV increase your risk of cervical cancer. Testing for HPV may also be done on women of any age with unclear Pap test results.  Other health care providers may not recommend any screening for nonpregnant women who are considered low risk for pelvic cancer and who do not have symptoms. Ask your health care provider if a screening pelvic exam is right for you.  If you have had past treatment for cervical cancer or a condition that could lead to cancer, you need Pap tests and screening for cancer for at least 20 years after your treatment. If Pap tests have been discontinued, your risk factors (such as having a new sexual partner) need to be reassessed to determine if screening should resume. Some women have medical problems that increase the chance of getting cervical cancer. In these cases, your health care provider may recommend more frequent screening and Pap tests.  Colorectal Cancer  This type of cancer can be detected and often prevented.  Routine colorectal cancer screening usually begins at 69 years of age and continues through 69 years of age.  Your health care provider may recommend screening at an earlier age if you have risk factors for colon cancer.  Your health care provider may also recommend using home test  kits to check for hidden blood in the stool.  A small camera at the end of a tube can be used to examine your colon directly (sigmoidoscopy or colonoscopy). This is done to check for the earliest forms of colorectal cancer.  Routine screening usually begins at age 39.  Direct examination of the colon should be repeated every 5-10 years through 69 years of age. However, you may need to be screened more often if early forms of precancerous polyps or small growths are found.  Skin Cancer  Check your skin from head to toe regularly.  Tell your health care provider about any new moles or changes in moles, especially if there is a change in a mole's shape or color.  Also tell your health care provider if you have a mole that is larger than the size of a pencil eraser.  Always use sunscreen. Apply sunscreen liberally and repeatedly throughout the day.  Protect yourself by wearing long sleeves, pants, a wide-brimmed hat, and sunglasses whenever you are outside.  Heart disease, diabetes,  and high blood pressure  High blood pressure causes heart disease and increases the risk of stroke. High blood pressure is more likely to develop in: ? People who have blood pressure in the high end of the normal range (130-139/85-89 mm Hg). ? People who are overweight or obese. ? People who are African American.  If you are 2-86 years of age, have your blood pressure checked every 3-5 years. If you are 30 years of age or older, have your blood pressure checked every year. You should have your blood pressure measured twice-once when you are at a hospital or clinic, and once when you are not at a hospital or clinic. Record the average of the two measurements. To check your blood pressure when you are not at a hospital or clinic, you can use: ? An automated blood pressure machine at a pharmacy. ? A home blood pressure monitor.  If you are between 83 years and 17 years old, ask your health care provider if you  should take aspirin to prevent strokes.  Have regular diabetes screenings. This involves taking a blood sample to check your fasting blood sugar level. ? If you are at a normal weight and have a low risk for diabetes, have this test once every three years after 69 years of age. ? If you are overweight and have a high risk for diabetes, consider being tested at a younger age or more often. Preventing infection Hepatitis B  If you have a higher risk for hepatitis B, you should be screened for this virus. You are considered at high risk for hepatitis B if: ? You were born in a country where hepatitis B is common. Ask your health care provider which countries are considered high risk. ? Your parents were born in a high-risk country, and you have not been immunized against hepatitis B (hepatitis B vaccine). ? You have HIV or AIDS. ? You use needles to inject street drugs. ? You live with someone who has hepatitis B. ? You have had sex with someone who has hepatitis B. ? You get hemodialysis treatment. ? You take certain medicines for conditions, including cancer, organ transplantation, and autoimmune conditions.  Hepatitis C  Blood testing is recommended for: ? Everyone born from [birth year] through 1963. ? Anyone with known risk factors for hepatitis C.  Sexually transmitted infections (STIs)  You should be screened for sexually transmitted infections (STIs) including gonorrhea and chlamydia if: ? You are sexually active and are younger than 69 years of age. ? You are older than 69 years of age and your health care provider tells you that you are at risk for this type of infection. ? Your sexual activity has changed since you were last screened and you are at an increased risk for chlamydia or gonorrhea. Ask your health care provider if you are at risk.  If you do not have HIV, but are at risk, it may be recommended that you take a prescription medicine daily to prevent HIV infection. This is  called pre-exposure prophylaxis (PrEP). You are considered at risk if: ? You are sexually active and do not regularly use condoms or know the HIV status of your partner(s). ? You take drugs by injection. ? You are sexually active with a partner who has HIV.  Talk with your health care provider about whether you are at high risk of being infected with HIV. If you choose to begin PrEP, you should first be tested for HIV. You should then  be tested every 3 months for as long as you are taking PrEP. Pregnancy  If you are premenopausal and you may become pregnant, ask your health care provider about preconception counseling.  If you may become pregnant, take 400 to 800 micrograms (mcg) of folic acid every day.  If you want to prevent pregnancy, talk to your health care provider about birth control (contraception). Osteoporosis and menopause  Osteoporosis is a disease in which the bones lose minerals and strength with aging. This can result in serious bone fractures. Your risk for osteoporosis can be identified using a bone density scan.  If you are 30 years of age or older, or if you are at risk for osteoporosis and fractures, ask your health care provider if you should be screened.  Ask your health care provider whether you should take a calcium or vitamin D supplement to lower your risk for osteoporosis.  Menopause may have certain physical symptoms and risks.  Hormone replacement therapy may reduce some of these symptoms and risks. Talk to your health care provider about whether hormone replacement therapy is right for you. Follow these instructions at home:  Schedule regular health, dental, and eye exams.  Stay current with your immunizations.  Do not use any tobacco products including cigarettes, chewing tobacco, or electronic cigarettes.  If you are pregnant, do not drink alcohol.  If you are breastfeeding, limit how much and how often you drink alcohol.  Limit alcohol intake to  no more than 1 drink per day for nonpregnant women. One drink equals 12 ounces of beer, 5 ounces of wine, or 1 ounces of hard liquor.  Do not use street drugs.  Do not share needles.  Ask your health care provider for help if you need support or information about quitting drugs.  Tell your health care provider if you often feel depressed.  Tell your health care provider if you have ever been abused or do not feel safe at home. This information is not intended to replace advice given to you by your health care provider. Make sure you discuss any questions you have with your health care provider. Document Released: 10/21/2010 Document Revised: 09/13/2015 Document Reviewed: 01/09/2015 Elsevier Interactive Patient Education  [employer].

## 2016-11-10 MED ORDER — LEVOTHYROXINE SODIUM 125 MCG PO TABS: 125.0000 ug | ORAL_TABLET | Freq: Every day | ORAL | 0 refills | Status: DC

## 2016-11-10 NOTE — Addendum Note (Signed)
Addended by: Agnes Lawrence on: 11/10/2016 01:28 PM   Modules accepted: Orders

## 2016-11-19 ENCOUNTER — Other Ambulatory Visit: Payer: Self-pay | Admitting: Family Medicine

## 2016-11-19 DIAGNOSIS — D225 Melanocytic nevi of trunk: Secondary | ICD-10-CM | POA: Diagnosis not present

## 2016-11-19 DIAGNOSIS — R202 Paresthesia of skin: Secondary | ICD-10-CM | POA: Diagnosis not present

## 2016-11-19 DIAGNOSIS — L820 Inflamed seborrheic keratosis: Secondary | ICD-10-CM | POA: Diagnosis not present

## 2016-11-19 DIAGNOSIS — D1801 Hemangioma of skin and subcutaneous tissue: Secondary | ICD-10-CM | POA: Diagnosis not present

## 2016-11-19 DIAGNOSIS — L814 Other melanin hyperpigmentation: Secondary | ICD-10-CM | POA: Diagnosis not present

## 2016-11-19 DIAGNOSIS — L821 Other seborrheic keratosis: Secondary | ICD-10-CM | POA: Diagnosis not present

## 2016-11-27 DIAGNOSIS — J3081 Allergic rhinitis due to animal (cat) (dog) hair and dander: Secondary | ICD-10-CM | POA: Diagnosis not present

## 2016-11-27 DIAGNOSIS — J301 Allergic rhinitis due to pollen: Secondary | ICD-10-CM | POA: Diagnosis not present

## 2016-11-27 DIAGNOSIS — J3089 Other allergic rhinitis: Secondary | ICD-10-CM | POA: Diagnosis not present

## 2016-12-03 ENCOUNTER — Ambulatory Visit (INDEPENDENT_AMBULATORY_CARE_PROVIDER_SITE_OTHER): Payer: Medicare HMO | Admitting: Gastroenterology

## 2016-12-03 ENCOUNTER — Encounter: Payer: Self-pay | Admitting: Gastroenterology

## 2016-12-03 ENCOUNTER — Other Ambulatory Visit (INDEPENDENT_AMBULATORY_CARE_PROVIDER_SITE_OTHER): Payer: Medicare HMO

## 2016-12-03 VITALS — BP 120/60 | HR 78 | Ht 65.0 in | Wt 198.0 lb

## 2016-12-03 DIAGNOSIS — R1011 Right upper quadrant pain: Secondary | ICD-10-CM

## 2016-12-03 DIAGNOSIS — R194 Change in bowel habit: Secondary | ICD-10-CM

## 2016-12-03 DIAGNOSIS — R1032 Left lower quadrant pain: Secondary | ICD-10-CM

## 2016-12-03 LAB — IGA: IgA: 193 mg/dL (ref 68–378)

## 2016-12-03 MED ORDER — DICYCLOMINE HCL 10 MG PO CAPS: 10.0000 mg | ORAL_CAPSULE | Freq: Three times a day (TID) | ORAL | 3 refills | Status: DC | PRN

## 2016-12-03 NOTE — Progress Notes (Signed)
HPI :  71 year old female here for follow-up visit. She's been seen here a few times for chronic nausea, see prior details in other notes. EGD was done 05/07/15 for chronic nausea and for dysphagia. I did not see any evidence of pathology noted to cause dysphagia. Otherwise she didn't have any clear pathology to cause nausea on her exam. She had some benign fundic gland polyps and biopsies of the stomach showed no evidence for H pylori. CT abdomen pelvis performed since the last visit which did not show a clear etiology or any acute pathology. She had 2 vessel coronary artery disease which led to a stress test which was normal. She has had a normal gastric emptying study. MRI brain also performed which did not show any pathology to cause chronic nausea. Stable pituitary cyst noted. She has had a fasting AM cortisol which was normal. US showed some sludge in the GB but no stones, and some slight fatty liver. She does not drink any alcohol. LFTs normal  Since her last visit her nausea is not bothering her as much as it has been in the past. Her main issue is left sided abdominal pain. She reports it is intermittent, she thinks for several months. She states it lasts 5-15 minutes at a times. She can feel it about 2-3 times per day. No clear triggers. Having a bowel movement can take it away. Pain does not awaken her at night. No vomiting. She has occasional nausea which has been chronic, but significantly improved since her last visit. Taking Zofran PRN.  She denies any positonal component or tenderness to the touch. She has had some issues with diarrhea in recent months - anywhere from no bowel movements per day to 3 BMs per day or so. She has some hard stools at times as well. No blood in the stools. No FH of colon cancer. She has cut out dairy and made dietary changes.    She has been on Zonisomide since January.She is on 125mg  zoloft daily.  CT abdomen / pelvis 10/14/2016 - no acute pathology. Small R  ovarian cyst. Pelvic US obtained which showed stable ovarian cyst, recommend surveillance in one year per radiology.   Colonoscopy 07/09/2007 - normal  Past Medical History:  Diagnosis Date  . ALLERGIC RHINITIS 05/27/2007   uses Flonase daily as needed and ALbuterol inhaler prn;gets Allergy shots  . Allergy   . Anxiety and depression 12/19/2009  . Arthritis   . Cancer (HCC)    vaginal  . Constipation   . Depression   . Diverticulosis   . Environmental allergies   . Essential hypertension, benign 06/26/2009  . GERD 05/27/2007   takes Nexium daily  . Heart murmur    yrs ago  . History of gout   . History of migraine many yrs ago  . Hyperlipidemia    takes Pravastatin daily  . Hypothyroidism   . Insomnia    but doesn't take any meds  . LOW BACK PAIN 05/27/2007  . Numbness    left toes   . Osteoporosis   . Seizure disorder [facility])    reports remote, takes Tegretol daily, followed by neurology  . Seizures ([place])    last 1983, none since then     Past Surgical History:  Procedure Laterality Date  . ADENOIDECTOMY    . BACK SURGERY     x2  . COLONOSCOPY    . ESOPHAGOGASTRODUODENOSCOPY    . Heel spurs Bilateral   . KNEE ARTHROPLASTY  Left 11/07/2013   Procedure: COMPUTER ASSISTED TOTAL KNEE ARTHROPLASTY;  Surgeon: Marybelle Killings, MD;  Location: [facility];  Service: Orthopedics;  Laterality: Left;  Left Total Knee Arthroplasty, Cemented, Computer Assist  . TONSILLECTOMY    . TOTAL KNEE REVISION Left 09/19/2014   Procedure: LEFT TOTAL KNEE REVISION;  Surgeon: Leandrew Koyanagi, MD;  Location: [facility];  Service: Orthopedics;  Laterality: Left;   Family History  Problem Relation Age of Onset  . Arthritis Mother   . Stroke Mother   . Heart murmur Other   . Colon cancer Neg Hx   . Esophageal cancer Neg Hx   . Stomach cancer Neg Hx   . Rectal cancer Neg Hx    Social History  Substance Use Topics  . Smoking status: Former Smoker    Packs/day: 0.25    Years: 20.00    Types: Cigarettes    Quit  date: 09/08/1978  . Smokeless tobacco: Never Used     Comment: quit smokikng 41yrs ago  . Alcohol use No   Current Outpatient Prescriptions  Medication Sig Dispense Refill  . albuterol (PROVENTIL HFA;VENTOLIN HFA) 108 (90 BASE) MCG/ACT inhaler Inhale 1 puff into the lungs every 6 (six) hours as needed for wheezing or shortness of breath.    Marland Kitchen aspirin 81 MG tablet Take 81 mg by mouth daily.    . beta carotene w/minerals (OCUVITE) tablet Take 1 tablet by mouth daily.    . Cyanocobalamin (VITAMIN B 12 PO) Take by mouth daily.      . cyclobenzaprine (FLEXERIL) 5 MG tablet Take 1 tablet (5 mg total) by mouth at bedtime as needed for muscle spasms. 30 tablet 0  . esomeprazole (NEXIUM) 20 MG capsule Take 20 mg by mouth daily before breakfast.    . fluticasone (FLONASE) 50 MCG/ACT nasal spray Place 2 sprays into both nostrils daily. 16 g 6  . levocetirizine (XYZAL) 5 MG tablet     . levothyroxine (SYNTHROID, LEVOTHROID) 125 MCG tablet Take 1 tablet (125 mcg total) by mouth daily. 90 tablet 0  . lisinopril (PRINIVIL,ZESTRIL) 5 MG tablet TAKE 1 TABLET (5 MG TOTAL) BY MOUTH DAILY. 90 tablet 1  . Multiple Vitamin (MULTIVITAMIN) capsule Take 1 capsule by mouth daily.      . Omega-3 Fatty Acids (FISH OIL PO) Take by mouth daily.      . ondansetron (ZOFRAN) 4 MG tablet Take 1 tablet (4 mg total) by mouth every 8 (eight) hours as needed for nausea or vomiting. 30 tablet 1  . polyethylene glycol powder (GLYCOLAX/MIRALAX) powder MIX 17 GRAMS DAILY AS DIRECTED 255 g 0  . pravastatin (PRAVACHOL) 20 MG tablet TAKE 1 TABLET(20 MG) BY MOUTH DAILY 90 tablet 1  . senna-docusate (SENOKOT S) 8.6-50 MG per tablet Take 1 tablet by mouth at bedtime as needed. 30 tablet 1  . sertraline (ZOLOFT) 100 MG tablet Take 1 tablet (100 mg total) by mouth daily. (Patient taking differently: Take 125 mg by mouth daily. ) 90 tablet 1  . sertraline (ZOLOFT) 25 MG tablet Take 25 mg by mouth daily.    Marland Kitchen zonisamide (ZONEGRAN) 100 MG  capsule Take 200 mg by mouth 2 (two) times daily.     No current facility-administered medications for this visit.    Allergies  Allergen Reactions  . Lamotrigine Swelling    Tongue swelling  . Bupropion Other (See Comments)    not known  . Carbamazepine Other (See Comments)    Hypnatremia  . Levetiracetam  Other reaction(s): Dizziness (intolerance)  . Tramadol Other (See Comments)    not known  . Adhesive [Tape]     ?  Blister after knee surgery  . Clarithromycin     Unsure of reaction    . Doxycycline     Interfered with seizure medication  . Nickel     Had to remove knee implant with nickel and replace it  . Other     Metal and perfumes  . Estradiol Rash  . Phenytoin Sodium Extended Other (See Comments)    drowsiness     Review of Systems: All systems reviewed and negative except where noted in HPI.   Lab Results  Component Value Date   WBC 5.9 10/07/2016   HGB 13.5 10/07/2016   HCT 40.6 10/07/2016   MCV 94.2 10/07/2016   PLT 192.0 10/07/2016    Lab Results  Component Value Date   CREATININE 0.87 10/07/2016   BUN 11 10/07/2016   NA 141 10/07/2016   K 4.3 10/07/2016   CL 106 10/07/2016   CO2 28 10/07/2016    Lab Results  Component Value Date   ALT 28 11/12/2015   AST 20 11/12/2015   ALKPHOS 83 11/12/2015   BILITOT 0.3 10/05/2015     Physical Exam: BP 120/60   Pulse 78   Ht 5\' 5"  (1.651 m)   Wt 198 lb (89.8 kg)   BMI 32.95 kg/m  Constitutional: Pleasant,well-developed, female in no acute distress. HEENT: Normocephalic and atraumatic. Conjunctivae are normal. No scleral icterus. Neck supple.  Cardiovascular: Normal rate, regular rhythm.  Pulmonary/chest: Effort normal and breath sounds normal. No wheezing, rales or rhonchi. Abdominal: Soft, nondistended, mild LLQ TTP, equivocal Carnett sign. There are no masses palpable. No hepatomegaly. Extremities: no edema Lymphadenopathy: No cervical adenopathy noted. Neurological: Alert and  oriented to person place and time. Skin: Skin is warm and dry. No rashes noted. Psychiatric: Normal mood and affect. Behavior is normal.   ASSESSMENT AND PLAN: 71 year old female with a history of chronic nausea of unclear etiology here for a follow-up visit today for left lower quadrant pain and altered bowel habits. Her labs are normal and reassuring. Her CT scan is likewise reassuring, no pathology noted to cause her symptoms. I'm not sure if her abdominal pain is related to bowel spasm or potentially abdominal wall etiology. We'll give her some Bentyl to use every 8 hours as needed. Recommending she try Citrucel once daily to help normalize bowel habits. I'll screen her for celiac disease to ensure negative. Otherwise we discussed that she is due for a colonoscopy for screening purposes in March, we can consider performing that sooner given her symptoms. She wanted to hold off on colonoscopy and await her course with Bentyl and Citrucel first, if her symptoms persist despite this regimen then she will be agreeable to do colonoscopy. All questions answered, she agreed with the plan.  Newport Cellar, MD [facility] Pager [phone]

## 2016-12-03 NOTE — Patient Instructions (Signed)
If you are age 69 or older, your body mass index should be between 23-30. Your Body mass index is 32.95 kg/m. If this is out of the aforementioned range listed, please consider follow up with your Primary Care Provider.  If you are age 80 or younger, your body mass index should be between 19-25. Your Body mass index is 32.95 kg/m. If this is out of the aformentioned range listed, please consider follow up with your Primary Care Provider.   We have sent the following medications to your pharmacy for you to pick up at your convenience:  [facility] has requested that you go to the basement for the following lab work before leaving today:  [facility]  Please call us in one month if you have no improvement. At that time we will consider moving your colonoscopy report to an earlier date.  Dr. Havery Moros has recommended that you try Citrucel daily. This can be purchased over the counter.  Thank you.

## 2016-12-04 LAB — TISSUE TRANSGLUTAMINASE, IGA: TISSUE TRANSGLUTAMINASE AB, IGA: 1 U/mL (ref <4)

## 2016-12-08 ENCOUNTER — Ambulatory Visit (INDEPENDENT_AMBULATORY_CARE_PROVIDER_SITE_OTHER): Payer: Medicare HMO | Admitting: Psychology

## 2016-12-08 DIAGNOSIS — F411 Generalized anxiety disorder: Secondary | ICD-10-CM

## 2016-12-08 DIAGNOSIS — J301 Allergic rhinitis due to pollen: Secondary | ICD-10-CM | POA: Diagnosis not present

## 2016-12-08 DIAGNOSIS — F330 Major depressive disorder, recurrent, mild: Secondary | ICD-10-CM | POA: Diagnosis not present

## 2016-12-08 DIAGNOSIS — J3089 Other allergic rhinitis: Secondary | ICD-10-CM | POA: Diagnosis not present

## 2016-12-08 DIAGNOSIS — R69 Illness, unspecified: Secondary | ICD-10-CM | POA: Diagnosis not present

## 2016-12-08 DIAGNOSIS — J3081 Allergic rhinitis due to animal (cat) (dog) hair and dander: Secondary | ICD-10-CM | POA: Diagnosis not present

## 2016-12-12 DIAGNOSIS — J301 Allergic rhinitis due to pollen: Secondary | ICD-10-CM | POA: Diagnosis not present

## 2016-12-12 DIAGNOSIS — J3081 Allergic rhinitis due to animal (cat) (dog) hair and dander: Secondary | ICD-10-CM | POA: Diagnosis not present

## 2016-12-12 DIAGNOSIS — J3089 Other allergic rhinitis: Secondary | ICD-10-CM | POA: Diagnosis not present

## 2016-12-16 DIAGNOSIS — J3089 Other allergic rhinitis: Secondary | ICD-10-CM | POA: Diagnosis not present

## 2016-12-16 DIAGNOSIS — J3081 Allergic rhinitis due to animal (cat) (dog) hair and dander: Secondary | ICD-10-CM | POA: Diagnosis not present

## 2016-12-16 DIAGNOSIS — J301 Allergic rhinitis due to pollen: Secondary | ICD-10-CM | POA: Diagnosis not present

## 2016-12-18 DIAGNOSIS — J3081 Allergic rhinitis due to animal (cat) (dog) hair and dander: Secondary | ICD-10-CM | POA: Diagnosis not present

## 2016-12-18 DIAGNOSIS — J301 Allergic rhinitis due to pollen: Secondary | ICD-10-CM | POA: Diagnosis not present

## 2016-12-18 DIAGNOSIS — J3089 Other allergic rhinitis: Secondary | ICD-10-CM | POA: Diagnosis not present

## 2016-12-23 ENCOUNTER — Other Ambulatory Visit (INDEPENDENT_AMBULATORY_CARE_PROVIDER_SITE_OTHER): Payer: Medicare HMO

## 2016-12-23 DIAGNOSIS — J3081 Allergic rhinitis due to animal (cat) (dog) hair and dander: Secondary | ICD-10-CM | POA: Diagnosis not present

## 2016-12-23 DIAGNOSIS — E039 Hypothyroidism, unspecified: Secondary | ICD-10-CM | POA: Diagnosis not present

## 2016-12-23 DIAGNOSIS — J3089 Other allergic rhinitis: Secondary | ICD-10-CM | POA: Diagnosis not present

## 2016-12-23 DIAGNOSIS — J301 Allergic rhinitis due to pollen: Secondary | ICD-10-CM | POA: Diagnosis not present

## 2016-12-23 LAB — TSH: TSH: 0.12 u[IU]/mL — ABNORMAL LOW (ref 0.35–4.50)

## 2016-12-25 MED ORDER — LEVOTHYROXINE SODIUM 112 MCG PO TABS: 112.0000 ug | ORAL_TABLET | Freq: Every day | ORAL | 0 refills | Status: DC

## 2016-12-25 NOTE — Addendum Note (Signed)
Addended by: Agnes Lawrence on: 12/25/2016 05:23 PM   Modules accepted: Orders

## 2016-12-26 DIAGNOSIS — J3081 Allergic rhinitis due to animal (cat) (dog) hair and dander: Secondary | ICD-10-CM | POA: Diagnosis not present

## 2016-12-26 DIAGNOSIS — J3089 Other allergic rhinitis: Secondary | ICD-10-CM | POA: Diagnosis not present

## 2016-12-29 ENCOUNTER — Other Ambulatory Visit: Payer: Self-pay | Admitting: *Deleted

## 2016-12-29 NOTE — Telephone Encounter (Signed)
[facility] in [place] faxed a request for a 90-day supply of Levothyroxine 143mcg.  Only a 30 day supply was advised by you to call in after lab tests performed on 9/4.

## 2016-12-30 DIAGNOSIS — J3089 Other allergic rhinitis: Secondary | ICD-10-CM | POA: Diagnosis not present

## 2016-12-30 DIAGNOSIS — J3081 Allergic rhinitis due to animal (cat) (dog) hair and dander: Secondary | ICD-10-CM | POA: Diagnosis not present

## 2016-12-30 DIAGNOSIS — J301 Allergic rhinitis due to pollen: Secondary | ICD-10-CM | POA: Diagnosis not present

## 2016-12-31 NOTE — Telephone Encounter (Signed)
Yeah, if ok would do 30 days, as may need to change dose again.

## 2017-01-01 ENCOUNTER — Ambulatory Visit (INDEPENDENT_AMBULATORY_CARE_PROVIDER_SITE_OTHER): Payer: Medicare HMO

## 2017-01-01 DIAGNOSIS — Z23 Encounter for immunization: Secondary | ICD-10-CM

## 2017-01-01 MED ORDER — LEVOTHYROXINE SODIUM 112 MCG PO TABS: 112.0000 ug | ORAL_TABLET | Freq: Every day | ORAL | 0 refills | Status: DC

## 2017-01-01 NOTE — Telephone Encounter (Signed)
Rx done. 

## 2017-01-05 DIAGNOSIS — G40209 Localization-related (focal) (partial) symptomatic epilepsy and epileptic syndromes with complex partial seizures, not intractable, without status epilepticus: Secondary | ICD-10-CM | POA: Diagnosis not present

## 2017-01-05 DIAGNOSIS — G43009 Migraine without aura, not intractable, without status migrainosus: Secondary | ICD-10-CM | POA: Diagnosis not present

## 2017-01-06 ENCOUNTER — Ambulatory Visit: Payer: Medicare HMO | Admitting: Psychology

## 2017-01-06 DIAGNOSIS — J301 Allergic rhinitis due to pollen: Secondary | ICD-10-CM | POA: Diagnosis not present

## 2017-01-06 DIAGNOSIS — J3089 Other allergic rhinitis: Secondary | ICD-10-CM | POA: Diagnosis not present

## 2017-01-06 DIAGNOSIS — J3081 Allergic rhinitis due to animal (cat) (dog) hair and dander: Secondary | ICD-10-CM | POA: Diagnosis not present

## 2017-01-07 DIAGNOSIS — R69 Illness, unspecified: Secondary | ICD-10-CM | POA: Diagnosis not present

## 2017-01-08 ENCOUNTER — Encounter: Payer: Self-pay | Admitting: Family Medicine

## 2017-01-12 ENCOUNTER — Ambulatory Visit (INDEPENDENT_AMBULATORY_CARE_PROVIDER_SITE_OTHER): Payer: Medicare HMO

## 2017-01-12 ENCOUNTER — Ambulatory Visit (INDEPENDENT_AMBULATORY_CARE_PROVIDER_SITE_OTHER): Payer: Medicare HMO | Admitting: Orthopaedic Surgery

## 2017-01-12 ENCOUNTER — Encounter (INDEPENDENT_AMBULATORY_CARE_PROVIDER_SITE_OTHER): Payer: Self-pay | Admitting: Orthopaedic Surgery

## 2017-01-12 DIAGNOSIS — M25562 Pain in left knee: Secondary | ICD-10-CM | POA: Diagnosis not present

## 2017-01-12 DIAGNOSIS — Z96652 Presence of left artificial knee joint: Secondary | ICD-10-CM | POA: Diagnosis not present

## 2017-01-12 DIAGNOSIS — M7062 Trochanteric bursitis, left hip: Secondary | ICD-10-CM

## 2017-01-12 DIAGNOSIS — G8929 Other chronic pain: Secondary | ICD-10-CM

## 2017-01-12 MED ORDER — BUPIVACAINE HCL 0.5 % IJ SOLN
3.0000 mL | INTRAMUSCULAR | Status: AC | PRN
  Administered 2017-01-12: 3 mL via INTRA_ARTICULAR

## 2017-01-12 MED ORDER — LIDOCAINE HCL 1 % IJ SOLN
3.0000 mL | INTRAMUSCULAR | Status: AC | PRN
  Administered 2017-01-12: 3 mL

## 2017-01-12 MED ORDER — METHYLPREDNISOLONE ACETATE 40 MG/ML IJ SUSP
40.0000 mg | INTRAMUSCULAR | Status: AC | PRN
  Administered 2017-01-12: 40 mg via INTRA_ARTICULAR

## 2017-01-12 NOTE — Progress Notes (Signed)
Office Visit Note   Patient: Beverly Ballard           Date of Birth: October 10, 1945           MRN: [id number] Visit Date: 01/12/2017              Requested by: Lucretia Kern, DO [address] [place], [place] [address] PCP: Lucretia Kern, DO   Assessment & Plan: Visit Diagnoses:  1. Chronic pain of left knee   2. Status post revision of total replacement of left knee   3. Trochanteric bursitis, left hip     Plan: Overall impression is left patellofemoral dysfunction from quadriceps weakness and atrophy. Recommend aggressive physical therapy for strengthening. She will like to start with home exercise program first before getting into a formal physical therapy program. For her trochanteric bursitis I did give her an injection today under sterile conditions. Patient tolerates well. I did see on a scout film for CT scan that her hip joints are well preserved.  Follow-Up Instructions: Return if symptoms worsen or fail to improve.   Orders:  Orders Placed This Encounter  Procedures  . XR Knee 1-2 Views Left   No orders of the defined types were placed in this encounter.     Procedures: Large Joint Inj Date/Time: 01/12/2017 3:12 PM Performed by: Leandrew Koyanagi Authorized by: Leandrew Koyanagi   Consent Given by:  Patient Timeout: prior to procedure the correct patient, procedure, and site was verified   Indications:  Pain Location:  Hip Site:  L greater trochanter Prep: patient was prepped and draped in usual sterile fashion   Needle Size:  22 G Approach:  Lateral Ultrasound Guidance: No   Fluoroscopic Guidance: No   Arthrogram: No   Medications:  3 mL lidocaine 1 %; 3 mL bupivacaine 0.5 %; 40 mg methylPREDNISolone acetate 40 MG/ML     Clinical Data: No additional findings.   Subjective: Chief Complaint  Patient presents with  . Left Knee - Pain, Follow-up    Patient follows up today for her left knee pain and left lateral hip pain. She states her left knee pain  originates in the anterior aspect and radiates up the anterior thigh. She also has lateral hip pain is worse with pressure and touch. Denies any numbness and tingling. Denies any injuries.    Review of Systems  Constitutional: Negative.   HENT: Negative.   Eyes: Negative.   Respiratory: Negative.   Cardiovascular: Negative.   Endocrine: Negative.   Musculoskeletal: Negative.   Neurological: Negative.   Hematological: Negative.   Psychiatric/Behavioral: Negative.   All other systems reviewed and are negative.    Objective: Vital Signs: There were no vitals taken for this visit.  Physical Exam  Constitutional: She is oriented to person, place, and time. She appears well-developed and well-nourished.  Pulmonary/Chest: Effort normal.  Neurological: She is alert and oriented to person, place, and time.  Skin: Skin is warm. Capillary refill takes less than 2 seconds.  Psychiatric: She has a normal mood and affect. Her behavior is normal. Judgment and thought content normal.  Nursing note and vitals reviewed.   Ortho Exam Left knee exam shows a fully healed surgical scar. She does have noticeable quadriceps atrophy. Lateral hip is tender to palpation. Painless range of motion of the hip. Specialty Comments:  No specialty comments available.  Imaging: Xr Knee 1-2 Views Left  Result Date: 01/12/2017 Stable TKA    PMFS History: Patient  Active Problem List   Diagnosis Date Noted  . Trochanteric bursitis, left hip 01/12/2017  . Chronic pain of left knee 01/12/2017  . Ovarian cyst 10/30/2016  . Aortic atherosclerosis ([place]) 11/19/2015  . Hyperlipemia 04/03/2015  . S/P revision of total knee 09/19/2014  . Seizure disorder ([place]) 01/24/2014  . Obesity, BMI unknown 01/24/2014  . Osteoarthritis of left knee 11/07/2013  . Osteopenia, stable on dexa 2010 - followed by gyn 06/15/2012  . Pap smear abnormality of cervix/human papillomavirus (HPV) positive - 09/2011, followed by gyn  06/15/2012  . Essential hypertension 07/03/2009  . Hypothyroidism 05/27/2007  . Allergic rhinitis 05/27/2007  . GERD 05/27/2007   Past Medical History:  Diagnosis Date  . ALLERGIC RHINITIS 05/27/2007   uses Flonase daily as needed and ALbuterol inhaler prn;gets Allergy shots  . Allergy   . Anxiety and depression 12/19/2009  . Arthritis   . Cancer (HCC)    vaginal  . Constipation   . Depression   . Diverticulosis   . Environmental allergies   . Essential hypertension, benign 06/26/2009  . GERD 05/27/2007   takes Nexium daily  . Heart murmur    yrs ago  . History of gout   . History of migraine many yrs ago  . Hyperlipidemia    takes Pravastatin daily  . Hypothyroidism   . Insomnia    but doesn't take any meds  . LOW BACK PAIN 05/27/2007  . Numbness    left toes   . Osteoporosis   . Seizure disorder [facility]    reports remote, takes Tegretol daily, followed by neurology  . Seizures ([place])    last 1983, none since then    Family History  Problem Relation Age of Onset  . Arthritis Mother   . Stroke Mother   . Heart murmur Other   . Colon cancer Neg Hx   . Esophageal cancer Neg Hx   . Stomach cancer Neg Hx   . Rectal cancer Neg Hx     Past Surgical History:  Procedure Laterality Date  . ADENOIDECTOMY    . BACK SURGERY     x2  . COLONOSCOPY    . ESOPHAGOGASTRODUODENOSCOPY    . Heel spurs Bilateral   . KNEE ARTHROPLASTY Left 11/07/2013   Procedure: COMPUTER ASSISTED TOTAL KNEE ARTHROPLASTY;  Surgeon: Marybelle Killings, MD;  Location: [facility];  Service: Orthopedics;  Laterality: Left;  Left Total Knee Arthroplasty, Cemented, Computer Assist  . TONSILLECTOMY    . TOTAL KNEE REVISION Left 09/19/2014   Procedure: LEFT TOTAL KNEE REVISION;  Surgeon: Leandrew Koyanagi, MD;  Location: [facility];  Service: Orthopedics;  Laterality: Left;   Social History   Occupational History  . Retired    Social History Main Topics  . Smoking status: Former Smoker    Packs/day: 0.25    Years: 20.00     Types: Cigarettes    Quit date: 09/08/1978  . Smokeless tobacco: Never Used     Comment: quit smokikng 77yrs ago  . Alcohol use No  . Drug use: No  . Sexual activity: No

## 2017-01-13 DIAGNOSIS — J3089 Other allergic rhinitis: Secondary | ICD-10-CM | POA: Diagnosis not present

## 2017-01-13 DIAGNOSIS — J301 Allergic rhinitis due to pollen: Secondary | ICD-10-CM | POA: Diagnosis not present

## 2017-01-13 DIAGNOSIS — J3081 Allergic rhinitis due to animal (cat) (dog) hair and dander: Secondary | ICD-10-CM | POA: Diagnosis not present

## 2017-01-14 ENCOUNTER — Other Ambulatory Visit: Payer: Self-pay | Admitting: Family Medicine

## 2017-01-20 DIAGNOSIS — J301 Allergic rhinitis due to pollen: Secondary | ICD-10-CM | POA: Diagnosis not present

## 2017-01-20 DIAGNOSIS — J3089 Other allergic rhinitis: Secondary | ICD-10-CM | POA: Diagnosis not present

## 2017-01-20 DIAGNOSIS — R05 Cough: Secondary | ICD-10-CM | POA: Diagnosis not present

## 2017-01-20 DIAGNOSIS — H1045 Other chronic allergic conjunctivitis: Secondary | ICD-10-CM | POA: Diagnosis not present

## 2017-01-22 ENCOUNTER — Ambulatory Visit (INDEPENDENT_AMBULATORY_CARE_PROVIDER_SITE_OTHER): Payer: Medicare HMO | Admitting: Psychology

## 2017-01-22 DIAGNOSIS — F411 Generalized anxiety disorder: Secondary | ICD-10-CM

## 2017-01-22 DIAGNOSIS — R69 Illness, unspecified: Secondary | ICD-10-CM | POA: Diagnosis not present

## 2017-01-22 DIAGNOSIS — F330 Major depressive disorder, recurrent, mild: Secondary | ICD-10-CM

## 2017-01-26 DIAGNOSIS — J301 Allergic rhinitis due to pollen: Secondary | ICD-10-CM | POA: Diagnosis not present

## 2017-01-26 DIAGNOSIS — J3081 Allergic rhinitis due to animal (cat) (dog) hair and dander: Secondary | ICD-10-CM | POA: Diagnosis not present

## 2017-01-26 DIAGNOSIS — J3089 Other allergic rhinitis: Secondary | ICD-10-CM | POA: Diagnosis not present

## 2017-01-27 ENCOUNTER — Other Ambulatory Visit: Payer: Self-pay | Admitting: *Deleted

## 2017-01-27 MED ORDER — LEVOTHYROXINE SODIUM 112 MCG PO TABS: 112.0000 ug | ORAL_TABLET | Freq: Every day | ORAL | 0 refills | Status: DC

## 2017-01-27 NOTE — Telephone Encounter (Signed)
Rx done. 

## 2017-02-02 DIAGNOSIS — J3081 Allergic rhinitis due to animal (cat) (dog) hair and dander: Secondary | ICD-10-CM | POA: Diagnosis not present

## 2017-02-02 DIAGNOSIS — J3089 Other allergic rhinitis: Secondary | ICD-10-CM | POA: Diagnosis not present

## 2017-02-02 DIAGNOSIS — J301 Allergic rhinitis due to pollen: Secondary | ICD-10-CM | POA: Diagnosis not present

## 2017-02-05 ENCOUNTER — Other Ambulatory Visit: Payer: Self-pay | Admitting: Family Medicine

## 2017-02-06 ENCOUNTER — Other Ambulatory Visit (INDEPENDENT_AMBULATORY_CARE_PROVIDER_SITE_OTHER): Payer: Medicare HMO

## 2017-02-06 DIAGNOSIS — E039 Hypothyroidism, unspecified: Secondary | ICD-10-CM

## 2017-02-06 LAB — TSH: TSH: 0.18 u[IU]/mL — AB (ref 0.35–4.50)

## 2017-02-09 ENCOUNTER — Other Ambulatory Visit: Payer: Self-pay | Admitting: *Deleted

## 2017-02-09 MED ORDER — LISINOPRIL 5 MG PO TABS: 5.0000 mg | ORAL_TABLET | Freq: Every day | ORAL | 1 refills | Status: DC

## 2017-02-09 MED ORDER — LEVOTHYROXINE SODIUM 100 MCG PO TABS: 100.0000 ug | ORAL_TABLET | Freq: Every day | ORAL | 1 refills | Status: DC

## 2017-02-09 MED ORDER — PRAVASTATIN SODIUM 20 MG PO TABS: ORAL_TABLET | ORAL | 1 refills | Status: DC

## 2017-02-09 NOTE — Telephone Encounter (Signed)
Rx done. 

## 2017-02-10 ENCOUNTER — Other Ambulatory Visit (INDEPENDENT_AMBULATORY_CARE_PROVIDER_SITE_OTHER): Payer: Medicare HMO

## 2017-02-10 DIAGNOSIS — E039 Hypothyroidism, unspecified: Secondary | ICD-10-CM

## 2017-02-10 DIAGNOSIS — J301 Allergic rhinitis due to pollen: Secondary | ICD-10-CM | POA: Diagnosis not present

## 2017-02-10 DIAGNOSIS — J3089 Other allergic rhinitis: Secondary | ICD-10-CM | POA: Diagnosis not present

## 2017-02-10 DIAGNOSIS — J3081 Allergic rhinitis due to animal (cat) (dog) hair and dander: Secondary | ICD-10-CM | POA: Diagnosis not present

## 2017-02-10 LAB — T4, FREE: FREE T4: 0.87 ng/dL (ref 0.60–1.60)

## 2017-02-10 LAB — T3, FREE: T3 FREE: 3 pg/mL (ref 2.3–4.2)

## 2017-02-19 ENCOUNTER — Telehealth: Payer: Self-pay | Admitting: Gastroenterology

## 2017-02-19 NOTE — Telephone Encounter (Signed)
Called patient and lvm that next step would be to schedule a colonoscopy, due for screening colonoscopy. Asked her to call back to schedule this.

## 2017-02-19 NOTE — Telephone Encounter (Signed)
Yes if her symptoms persist I think a colonoscopy is reasonable, she would be due for this in a few months anyway for screening purposes. Please coordinate if she is willing. Thanks

## 2017-02-19 NOTE — Telephone Encounter (Signed)
Unable to speak to patient, looking at last ov note you had suggested a colonoscopy, do you still want to proceed with this?

## 2017-02-24 ENCOUNTER — Encounter: Payer: Self-pay | Admitting: Gastroenterology

## 2017-02-24 ENCOUNTER — Ambulatory Visit: Payer: Medicare HMO | Admitting: Psychology

## 2017-02-24 ENCOUNTER — Ambulatory Visit (INDEPENDENT_AMBULATORY_CARE_PROVIDER_SITE_OTHER): Payer: Medicare HMO | Admitting: Psychology

## 2017-02-24 ENCOUNTER — Ambulatory Visit (AMBULATORY_SURGERY_CENTER): Payer: Self-pay | Admitting: *Deleted

## 2017-02-24 VITALS — Ht 65.5 in | Wt 198.0 lb

## 2017-02-24 DIAGNOSIS — F330 Major depressive disorder, recurrent, mild: Secondary | ICD-10-CM | POA: Diagnosis not present

## 2017-02-24 DIAGNOSIS — R69 Illness, unspecified: Secondary | ICD-10-CM | POA: Diagnosis not present

## 2017-02-24 DIAGNOSIS — F411 Generalized anxiety disorder: Secondary | ICD-10-CM

## 2017-02-24 DIAGNOSIS — Z1211 Encounter for screening for malignant neoplasm of colon: Secondary | ICD-10-CM

## 2017-02-24 MED ORDER — NA SULFATE-K SULFATE-MG SULF 17.5-3.13-1.6 GM/177ML PO SOLN: 1.0000 | Freq: Once | ORAL | 0 refills | Status: AC

## 2017-02-24 NOTE — Progress Notes (Signed)
No egg or soy allergy known to patient  No issues with past sedation with any surgeries  or procedures, no intubation problems  No diet pills per patient No home 02 use per patient  No blood thinners per patient  Pt denies issues with constipation as long as uses the Sennokot or Miralax as needed  No A fib or A flutter  EMMI video sent to pt's e mail

## 2017-02-26 ENCOUNTER — Ambulatory Visit (INDEPENDENT_AMBULATORY_CARE_PROVIDER_SITE_OTHER): Payer: Medicare HMO | Admitting: Family Medicine

## 2017-02-26 ENCOUNTER — Encounter: Payer: Self-pay | Admitting: Family Medicine

## 2017-02-26 VITALS — BP 118/60 | HR 79 | Temp 98.1°F | Ht 65.5 in | Wt 201.3 lb

## 2017-02-26 DIAGNOSIS — R5383 Other fatigue: Secondary | ICD-10-CM

## 2017-02-26 DIAGNOSIS — R400 Somnolence: Secondary | ICD-10-CM | POA: Diagnosis not present

## 2017-02-26 DIAGNOSIS — E039 Hypothyroidism, unspecified: Secondary | ICD-10-CM | POA: Diagnosis not present

## 2017-02-26 DIAGNOSIS — J3081 Allergic rhinitis due to animal (cat) (dog) hair and dander: Secondary | ICD-10-CM | POA: Diagnosis not present

## 2017-02-26 DIAGNOSIS — R109 Unspecified abdominal pain: Secondary | ICD-10-CM | POA: Diagnosis not present

## 2017-02-26 DIAGNOSIS — J301 Allergic rhinitis due to pollen: Secondary | ICD-10-CM | POA: Diagnosis not present

## 2017-02-26 DIAGNOSIS — J3089 Other allergic rhinitis: Secondary | ICD-10-CM | POA: Diagnosis not present

## 2017-02-26 LAB — CBC WITH DIFFERENTIAL/PLATELET
BASOS ABS: 0 10*3/uL (ref 0.0–0.1)
Basophils Relative: 0.4 % (ref 0.0–3.0)
EOS PCT: 0 % (ref 0.0–5.0)
Eosinophils Absolute: 0 10*3/uL (ref 0.0–0.7)
HCT: 40.9 % (ref 36.0–46.0)
HEMOGLOBIN: 13.5 g/dL (ref 12.0–15.0)
Lymphocytes Relative: 25 % (ref 12.0–46.0)
Lymphs Abs: 1.8 10*3/uL (ref 0.7–4.0)
MCHC: 32.9 g/dL (ref 30.0–36.0)
MCV: 96.5 fl (ref 78.0–100.0)
MONO ABS: 0.6 10*3/uL (ref 0.1–1.0)
MONOS PCT: 8.5 % (ref 3.0–12.0)
Neutro Abs: 4.7 10*3/uL (ref 1.4–7.7)
Neutrophils Relative %: 66.1 % (ref 43.0–77.0)
Platelets: 218 10*3/uL (ref 150.0–400.0)
RBC: 4.24 Mil/uL (ref 3.87–5.11)
RDW: 13.2 % (ref 11.5–15.5)
WBC: 7.1 10*3/uL (ref 4.0–10.5)

## 2017-02-26 LAB — COMPREHENSIVE METABOLIC PANEL
ALBUMIN: 3.9 g/dL (ref 3.5–5.2)
ALK PHOS: 76 U/L (ref 39–117)
ALT: 11 U/L (ref 0–35)
AST: 16 U/L (ref 0–37)
BILIRUBIN TOTAL: 0.4 mg/dL (ref 0.2–1.2)
BUN: 15 mg/dL (ref 6–23)
CO2: 29 mEq/L (ref 19–32)
Calcium: 9 mg/dL (ref 8.4–10.5)
Chloride: 108 mEq/L (ref 96–112)
Creatinine, Ser: 0.81 mg/dL (ref 0.40–1.20)
GFR: 74.5 mL/min (ref >60.00)
Glucose, Bld: 97 mg/dL (ref 70–99)
Potassium: 4.3 mEq/L (ref 3.5–5.1)
Sodium: 141 mEq/L (ref 135–145)
TOTAL PROTEIN: 6.8 g/dL (ref 6.0–8.3)

## 2017-02-26 LAB — VITAMIN D 25 HYDROXY (VIT D DEFICIENCY, FRACTURES): VITD: 25.28 ng/mL — ABNORMAL LOW (ref 30.00–100.00)

## 2017-02-26 LAB — VITAMIN B12: Vitamin B-12: 612 pg/mL (ref 211–911)

## 2017-02-26 NOTE — Patient Instructions (Signed)
BEFORE YOU LEAVE: -labs  -follow up: 2-3 months  -We placed a referral for you as discussed for evaluation for sleep apnea. It usually takes about 1-2 weeks to process and schedule this referral. If you have not heard from [place] regarding this appointment in 2 weeks please contact our office.  -call your psychiatrist today for appointment to discuss medication  -can request wait list to see the endocrinologist  -start getting regular gentle exercise  colonoscopy next week as planned  We have ordered labs or studies at this visit. It can take up to 1-2 weeks for results and processing. IF results require follow up or explanation, we will call you with instructions. Clinically stable results will be released to your [facility]. If you have not heard from [place] or cannot find your results in [facility] in 2 weeks please contact our office at [phone].  If you are not yet signed up for [facility], please consider signing up.

## 2017-02-26 NOTE — Progress Notes (Signed)
HPI:  Acute visit for fatigue: -chronic, many years, worsening -ongoing depression - seeing psychiatrist and psychologist -feels down frequently, anhedonia, fatigue -feels sleeps ok, but then does not feel like she slept when she wakes up -has gained some wt -having colonoscopy next week for ongoing abd pain -seeing endocrinologist in a few weeks as has had persistently elevated TSH despite change in dose since changing to generic levothyroxine -no fevers, wt loss, rash, CP, SOB, vomiting, SI, thoughts of self haem, know snoring or apnea -hx remote tonsillectomy -no regular exercise -feels diet is fairly healty  ROS: See pertinent positives and negatives per HPI.  Past Medical History:  Diagnosis Date  . ALLERGIC RHINITIS 05/27/2007   uses Flonase daily as needed and ALbuterol inhaler prn;gets Allergy shots  . Allergy   . Anxiety and depression 12/19/2009  . Arthritis   . Cancer (HCC)    vaginal  . Constipation   . Degenerative disorder of eye   . Depression   . Diverticulosis   . Environmental allergies   . Essential hypertension, benign 06/26/2009  . GERD 05/27/2007   takes Nexium daily  . Heart murmur    yrs ago  . History of gout   . History of migraine many yrs ago  . Hyperlipidemia    takes Pravastatin daily  . Hypothyroidism   . Insomnia    but doesn't take any meds  . LOW BACK PAIN 05/27/2007  . Numbness    left toes   . Osteoporosis   . Seizure disorder [facility]    reports remote, takes Tegretol daily, followed by neurology  . Seizures ([place])    last 1983, none since then- last seizure 1988 per pt     Past Surgical History:  Procedure Laterality Date  . ADENOIDECTOMY    . BACK SURGERY     x2  . COLONOSCOPY  2009  . ESOPHAGOGASTRODUODENOSCOPY    . Heel spurs Bilateral   . TONSILLECTOMY    . UPPER GASTROINTESTINAL ENDOSCOPY      Family History  Problem Relation Age of Onset  . Arthritis Mother   . Stroke Mother   . Heart murmur Other   . Colon cancer  Neg Hx   . Esophageal cancer Neg Hx   . Stomach cancer Neg Hx   . Rectal cancer Neg Hx   . Colon polyps Neg Hx     Social History   Socioeconomic History  . Marital status: Married    Spouse name: None  . Number of children: 1  . Years of education: None  . Highest education level: None  Social Needs  . Financial resource strain: None  . Food insecurity - worry: None  . Food insecurity - inability: None  . Transportation needs - medical: None  . Transportation needs - non-medical: None  Occupational History  . Occupation: Retired  Tobacco Use  . Smoking status: Former Smoker    Packs/day: 0.25    Years: 20.00    Pack years: 5.00    Types: Cigarettes    Last attempt to quit: 09/08/1978    Years since quitting: 38.4  . Smokeless tobacco: Never Used  . Tobacco comment: quit smokikng 31yrs ago  Substance and Sexual Activity  . Alcohol use: No  . Drug use: No  . Sexual activity: No    Birth control/protection: Post-menopausal  Other Topics Concern  . None  Social History Narrative  . None     Current Outpatient Medications:  .  albuterol (  PROVENTIL HFA;VENTOLIN HFA) 108 (90 BASE) MCG/ACT inhaler, Inhale 1 puff into the lungs every 6 (six) hours as needed for wheezing or shortness of breath., Disp: , Rfl:  .  aspirin 81 MG tablet, Take 81 mg by mouth daily., Disp: , Rfl:  .  beta carotene w/minerals (OCUVITE) tablet, Take 1 tablet by mouth daily., Disp: , Rfl:  .  Cyanocobalamin (VITAMIN B 12 PO), Take by mouth daily.  , Disp: , Rfl:  .  cyclobenzaprine (FLEXERIL) 5 MG tablet, Take 1 tablet (5 mg total) by mouth at bedtime as needed for muscle spasms., Disp: 30 tablet, Rfl: 0 .  dicyclomine (BENTYL) 10 MG capsule, Take 1 capsule (10 mg total) by mouth every 8 (eight) hours as needed for spasms., Disp: 90 capsule, Rfl: 3 .  esomeprazole (NEXIUM) 20 MG capsule, Take 20 mg by mouth daily before breakfast., Disp: , Rfl:  .  fluticasone (FLONASE) 50 MCG/ACT nasal spray, Place  2 sprays into both nostrils daily., Disp: 16 g, Rfl: 6 .  levocetirizine (XYZAL) 5 MG tablet, , Disp: , Rfl:  .  levothyroxine (SYNTHROID, LEVOTHROID) 100 MCG tablet, Take 1 tablet (100 mcg total) by mouth daily., Disp: 90 tablet, Rfl: 1 .  lisinopril (PRINIVIL,ZESTRIL) 5 MG tablet, Take 1 tablet (5 mg total) by mouth daily., Disp: 90 tablet, Rfl: 1 .  Multiple Vitamin (MULTIVITAMIN) capsule, Take 1 capsule by mouth daily.  , Disp: , Rfl:  .  Omega-3 Fatty Acids (FISH OIL PO), Take by mouth daily.  , Disp: , Rfl:  .  ondansetron (ZOFRAN) 4 MG tablet, Take 1 tablet (4 mg total) by mouth every 8 (eight) hours as needed for nausea or vomiting., Disp: 30 tablet, Rfl: 1 .  polyethylene glycol powder (GLYCOLAX/MIRALAX) powder, MIX 17 GRAMS DAILY AS DIRECTED, Disp: 255 g, Rfl: 0 .  pravastatin (PRAVACHOL) 20 MG tablet, TAKE 1 TABLET(20 MG) BY MOUTH DAILY, Disp: 90 tablet, Rfl: 1 .  senna-docusate (SENOKOT S) 8.6-50 MG per tablet, Take 1 tablet by mouth at bedtime as needed., Disp: 30 tablet, Rfl: 1 .  sertraline (ZOLOFT) 100 MG tablet, Take 1 tablet (100 mg total) by mouth daily. (Patient taking differently: Take 125 mg by mouth daily. ), Disp: 90 tablet, Rfl: 1 .  sertraline (ZOLOFT) 25 MG tablet, Take 25 mg by mouth daily., Disp: , Rfl:  .  zonisamide (ZONEGRAN) 100 MG capsule, Take 200 mg by mouth 2 (two) times daily., Disp: , Rfl:   EXAM:  Vitals:   02/26/17 1102  BP: 118/60  Pulse: 79  Temp: 98.1 F (36.7 C)    Body mass index is 32.99 kg/m.  GENERAL: vitals reviewed and listed above, alert, oriented, appears well hydrated and in no acute distress  HEENT: atraumatic, conjunttiva clear, no obvious abnormalities on inspection of external nose and ears  NECK: no obvious masses on inspection  LUNGS: clear to auscultation bilaterally, no wheezes, rales or rhonchi, good air movement  CV: HRRR, no peripheral edema  MS: moves all extremities without noticeable abnormality  PSYCH:  pleasant and cooperative, no obvious depression or anxiety  ASSESSMENT AND PLAN:  Discussed the following assessment and plan:  Daytime somnolence - Plan: Ambulatory referral to Pulmonology, Comprehensive metabolic panel, CBC with Differential/Platelet, Vitamin B12, VITAMIN D 25 Hydroxy (Vit-D Deficiency, Fractures)  Fatigue, unspecified type  Hypothyroidism, unspecified type  Abdominal pain, unspecified abdominal location  -Chronic fatigue, seems to be worsening -Depression is not well controlled and this may be contributing, she is to  follow-up with her psychiatrist and psychologist regarding this and consider change in medication -She also will be seeing the endocrinologist soon about her thyroid, I do wonder if this is playing a role as well -Is having a colonoscopy next week and is seeing GI and is undergoing evaluation for the abdominal pain we will see how this pans out -Labs per orders -We will also do the sleep study given she has had weight gain and feels that she is not rested after night sleep -Patient advised to return or notify a doctor immediately if symptoms worsen or persist or new concerns arise.  Patient Instructions  BEFORE YOU LEAVE: -labs  -follow up: 2-3 months  -We placed a referral for you as discussed for evaluation for sleep apnea. It usually takes about 1-2 weeks to process and schedule this referral. If you have not heard from [place] regarding this appointment in 2 weeks please contact our office.  -call your psychiatrist today for appointment to discuss medication  -can request wait list to see the endocrinologist  -start getting regular gentle exercise  colonoscopy next week as planned  We have ordered labs or studies at this visit. It can take up to 1-2 weeks for results and processing. IF results require follow up or explanation, we will call you with instructions. Clinically stable results will be released to your [facility]. If you have not heard from  [place] or cannot find your results in [facility] in 2 weeks please contact our office at [phone].  If you are not yet signed up for [facility], please consider signing up.          Colin Benton R., DO

## 2017-03-02 ENCOUNTER — Telehealth: Payer: Self-pay | Admitting: Gastroenterology

## 2017-03-02 NOTE — Telephone Encounter (Signed)
$  50 coupon called into patient's pharmacy. Patient is aware and is ok with paying $50 for the Suprep.

## 2017-03-05 ENCOUNTER — Telehealth: Payer: Self-pay

## 2017-03-05 ENCOUNTER — Ambulatory Visit (AMBULATORY_SURGERY_CENTER): Payer: Medicare HMO | Admitting: Gastroenterology

## 2017-03-05 ENCOUNTER — Ambulatory Visit: Payer: Self-pay | Admitting: Family Medicine

## 2017-03-05 ENCOUNTER — Encounter: Payer: Self-pay | Admitting: Gastroenterology

## 2017-03-05 ENCOUNTER — Other Ambulatory Visit: Payer: Self-pay

## 2017-03-05 VITALS — BP 133/58 | HR 53 | Temp 97.7°F | Resp 14 | Ht 65.5 in | Wt 198.0 lb

## 2017-03-05 DIAGNOSIS — R569 Unspecified convulsions: Secondary | ICD-10-CM | POA: Diagnosis not present

## 2017-03-05 DIAGNOSIS — K5730 Diverticulosis of large intestine without perforation or abscess without bleeding: Secondary | ICD-10-CM | POA: Diagnosis not present

## 2017-03-05 DIAGNOSIS — I10 Essential (primary) hypertension: Secondary | ICD-10-CM | POA: Diagnosis not present

## 2017-03-05 DIAGNOSIS — Z1211 Encounter for screening for malignant neoplasm of colon: Secondary | ICD-10-CM | POA: Diagnosis not present

## 2017-03-05 DIAGNOSIS — R194 Change in bowel habit: Secondary | ICD-10-CM

## 2017-03-05 DIAGNOSIS — R1032 Left lower quadrant pain: Secondary | ICD-10-CM | POA: Diagnosis present

## 2017-03-05 MED ORDER — SODIUM CHLORIDE 0.9 % IV SOLN: 500.0000 mL | INTRAVENOUS | Status: DC

## 2017-03-05 NOTE — Telephone Encounter (Signed)
Spoke to patient, she will contact her provider that writes for the Zoloft. She is also aware to contact Dr. Maudie Mercury if needed.

## 2017-03-05 NOTE — Telephone Encounter (Signed)
-----   Message from Yetta Flock, MD sent at 03/05/2017  4:03 PM EST ----- Regarding: FW: mutual patient Hi Almyra Free, Would you mind calling this patient. I spoke with her today about getting in touch with Dr. Maudie Mercury and heard back, as below.  I think switching from Zoloft to Cymbalta may be a good option for her symptoms. She is on a high dose of Zoloft, not sure if she would want to see Dr. Maudie Mercury prior to making a switch as she will need to taper down prior to switching. Can you see if the patient is okay with doing this? Thanks   ----- Message ----- From: Lucretia Kern, DO Sent: 03/05/2017   2:56 PM To: Yetta Flock, MD Subject: RE: mutual patient                             Thanks a bunch! I would be fine with her trying a change if she was on board with it. If she would prefer to see me to discuss and make a change here, that is fine too. Thanks.  Jarrett Soho ----- Message ----- From: Yetta Flock, MD Sent: 03/05/2017  12:03 PM To: Lucretia Kern, DO Subject: mutual patient                                 Hi Dr. Maudie Mercury, I saw this patient today for her colonoscopy. It was entirely normal, I don't see anything on her exam to account for her intermittent abdominal pain / cramps. Her prior CT with you was normal. I think she may have functional or neuropathic / musculoskeletal pain and I discussed options with her.   I think Cymbalta or TCA may be a good choice but she's on Zoloft. Would you be okay if she transitioned from Zoloft to Cymbalta, or would you prefer to use something else, or see her first? She asked me to discuss with you prior to making changes.   Thanks for your help. Richardson Landry

## 2017-03-05 NOTE — Telephone Encounter (Signed)
Thanks Julie

## 2017-03-05 NOTE — Op Note (Signed)
[facility] Patient Name: Beverly Ballard Procedure Date: 03/05/2017 11:23 AM MRN: [id number] Endoscopist: Remo Lipps P. Armbruster MD, MD Age: 71 Referring MD:  Date of Birth: 07-18-1945 Gender: Female Account #: [id number] Procedure:                Colonoscopy Indications:              Abdominal pain in the left lower quadrant, Change                            in bowel habits Medicines:                Monitored Anesthesia Care Procedure:                Pre-Anesthesia Assessment:                           - Prior to the procedure, a History and Physical                            was performed, and patient medications and                            allergies were reviewed. The patient's tolerance of                            previous anesthesia was also reviewed. The risks                            and benefits of the procedure and the sedation                            options and risks were discussed with the patient.                            All questions were answered, and informed consent                            was obtained. Prior Anticoagulants: The patient has                            taken no previous anticoagulant or antiplatelet                            agents. ASA Grade Assessment: III - A patient with                            severe systemic disease. After reviewing the risks                            and benefits, the patient was deemed in                            satisfactory condition to undergo the procedure.  After obtaining informed consent, the colonoscope                            was passed under direct vision. Throughout the                            procedure, the patient's blood pressure, pulse, and                            oxygen saturations were monitored continuously. The                            Colonoscope was introduced through the anus and                            advanced to the the terminal  ileum, with                            identification of the appendiceal orifice and IC                            valve. The colonoscopy was performed without                            difficulty. The patient tolerated the procedure                            well. The quality of the bowel preparation was                            good. The terminal ileum, ileocecal valve,                            appendiceal orifice, and rectum were photographed. Scope In: 11:31:20 AM Scope Out: 31:51:76 AM Scope Withdrawal Time: 0 hours 11 minutes 35 seconds  Total Procedure Duration: 0 hours 14 minutes 48 seconds  Findings:                 The perianal and digital rectal examinations were                            normal.                           The terminal ileum appeared normal.                           A few medium-mouthed diverticula were found in the                            sigmoid colon.                           Anal papilla(e) were hypertrophied.  The exam was otherwise without abnormality. No                            polyps Complications:            No immediate complications. Estimated blood loss:                            None. Estimated Blood Loss:     Estimated blood loss: none. Impression:               - The examined portion of the ileum was normal.                           - Diverticulosis in the sigmoid colon.                           - Anal papilla(e) were hypertrophied.                           - The examination was otherwise normal.                           - No polyps                           Overall, no cause for symptoms noted on this exam. Recommendation:           - Patient has a contact number available for                            emergencies. The signs and symptoms of potential                            delayed complications were discussed with the                            patient. Return to normal activities tomorrow.                             Written discharge instructions were provided to the                            patient.                           - Resume previous diet.                           - Continue present medications.                           - No further colonoscopy screening may be needed                            (you would not warrant colonoscopy for another 10  years at which point you will be 71 years old - you                            can discuss with [place] at that time if you wish to                            have further colon cancer screening)                           - Follow up in our clinic for reassessment if                            symptoms persist Remo Lipps P. Armbruster MD, MD 03/05/2017 11:51:21 AM This report has been signed electronically.

## 2017-03-05 NOTE — Telephone Encounter (Signed)
Left a message for patient to contact our office to discuss medication change.

## 2017-03-05 NOTE — Progress Notes (Signed)
Pt's states no medical or surgical changes since previsit or office visit. 

## 2017-03-05 NOTE — Progress Notes (Signed)
Report given to PACU, vss 

## 2017-03-05 NOTE — Patient Instructions (Addendum)
YOU HAD AN ENDOSCOPIC PROCEDURE TODAY AT [facility]:   Refer to the procedure report that was given to you for any specific questions about what was found during the examination.  If the procedure report does not answer your questions, please call your gastroenterologist to clarify.  If you requested that your care partner not be given the details of your procedure findings, then the procedure report has been included in a sealed envelope for you to review at your convenience later.  YOU SHOULD EXPECT: Some feelings of bloating in the abdomen. Passage of more gas than usual.  Walking can help get rid of the air that was put into your GI tract during the procedure and reduce the bloating. If you had a lower endoscopy (such as a colonoscopy or flexible sigmoidoscopy) you may notice spotting of blood in your stool or on the toilet paper. If you underwent a bowel prep for your procedure, you may not have a normal bowel movement for a few days.  Please Note:  You might notice some irritation and congestion in your nose or some drainage.  This is from the oxygen used during your procedure.  There is no need for concern and it should clear up in a day or so.  SYMPTOMS TO REPORT IMMEDIATELY:   Following lower endoscopy (colonoscopy or flexible sigmoidoscopy):  Excessive amounts of blood in the stool  Significant tenderness or worsening of abdominal pains  Swelling of the abdomen that is new, acute  Fever of 100F or higher  For urgent or emergent issues, a gastroenterologist can be reached at any hour by calling [phone].   DIET:  We do recommend a small meal at first, but then you may proceed to your regular diet.  Drink plenty of fluids but you should avoid alcoholic beverages for 24 hours.  ACTIVITY:  You should plan to take it easy for the rest of today and you should NOT DRIVE or use heavy machinery until tomorrow (because of the sedation medicines used during the test).     FOLLOW UP: Our staff will call the number listed on your records the next business day following your procedure to check on you and address any questions or concerns that you may have regarding the information given to you following your procedure. If we do not reach you, we will leave a message.  However, if you are feeling well and you are not experiencing any problems, there is no need to return our call.  We will assume that you have returned to your regular daily activities without incident.  If any biopsies were taken you will be contacted by phone or by letter within the next 1-3 weeks.  Please call us at [phone] if you have not heard about the biopsies in 3 weeks.   No further Colonoscopy screening due to age Diverticulosis (handout given)    SIGNATURES/CONFIDENTIALITY: You and/or your care partner have signed paperwork which will be entered into your electronic medical record.  These signatures attest to the fact that that the information above on your After Visit Summary has been reviewed and is understood.  Full responsibility of the confidentiality of this discharge information lies with you and/or your care-partner.

## 2017-03-06 ENCOUNTER — Telehealth: Payer: Self-pay | Admitting: *Deleted

## 2017-03-06 NOTE — Telephone Encounter (Signed)
Left message on f/u call 

## 2017-03-06 NOTE — Telephone Encounter (Signed)
  Follow up Call-  Call back number 03/05/2017 05/07/2015  Post procedure Call Back phone  # [phone]  Permission to leave phone message Yes Yes  Some recent data might be hidden     Patient questions:  Message left to call us if necessary.

## 2017-03-20 DIAGNOSIS — J301 Allergic rhinitis due to pollen: Secondary | ICD-10-CM | POA: Diagnosis not present

## 2017-03-20 DIAGNOSIS — J3089 Other allergic rhinitis: Secondary | ICD-10-CM | POA: Diagnosis not present

## 2017-03-20 DIAGNOSIS — J3081 Allergic rhinitis due to animal (cat) (dog) hair and dander: Secondary | ICD-10-CM | POA: Diagnosis not present

## 2017-03-24 DIAGNOSIS — R1030 Lower abdominal pain, unspecified: Secondary | ICD-10-CM | POA: Diagnosis not present

## 2017-03-24 DIAGNOSIS — N83201 Unspecified ovarian cyst, right side: Secondary | ICD-10-CM | POA: Diagnosis not present

## 2017-03-24 DIAGNOSIS — Z1231 Encounter for screening mammogram for malignant neoplasm of breast: Secondary | ICD-10-CM | POA: Diagnosis not present

## 2017-03-24 DIAGNOSIS — Z124 Encounter for screening for malignant neoplasm of cervix: Secondary | ICD-10-CM | POA: Diagnosis not present

## 2017-03-24 LAB — HM MAMMOGRAPHY

## 2017-03-27 DIAGNOSIS — J3081 Allergic rhinitis due to animal (cat) (dog) hair and dander: Secondary | ICD-10-CM | POA: Diagnosis not present

## 2017-03-27 DIAGNOSIS — J3089 Other allergic rhinitis: Secondary | ICD-10-CM | POA: Diagnosis not present

## 2017-03-27 DIAGNOSIS — J301 Allergic rhinitis due to pollen: Secondary | ICD-10-CM | POA: Diagnosis not present

## 2017-03-30 ENCOUNTER — Ambulatory Visit: Payer: Medicare HMO | Admitting: Psychology

## 2017-04-16 ENCOUNTER — Ambulatory Visit: Payer: Medicare HMO | Admitting: Internal Medicine

## 2017-04-16 ENCOUNTER — Encounter: Payer: Self-pay | Admitting: Internal Medicine

## 2017-04-16 ENCOUNTER — Other Ambulatory Visit: Payer: Self-pay | Admitting: Family Medicine

## 2017-04-16 VITALS — BP 126/78 | HR 70 | Ht 65.75 in | Wt 201.4 lb

## 2017-04-16 DIAGNOSIS — E890 Postprocedural hypothyroidism: Secondary | ICD-10-CM | POA: Diagnosis not present

## 2017-04-16 NOTE — Progress Notes (Signed)
Patient ID: Beverly Ballard, female   DOB: Nov 20, 1945, 71 y.o.   MRN: [id number]    HPI  Beverly Ballard is a 71 y.o.-year-old female, referred by her PCP, Dr. Maudie Mercury, for management of uncontrolled hypothyroidism. She saw Dr. Wilson Singer before, but he retired.  Pt. has been dx with hypothyroidism in 1980s afer RA tx with Graves ds. >> on Synthroid >> now Levothyroxine 100 mcg, decreased from 112 mcg 2 mo ago.  She tells me she was on 150 mcg of levothyroxine for several years in the past before she started to see Dr. Maudie Mercury, who has been trying to reduce her levothyroxine dose to improve her TFTs to the normal range.  However, her doses had to be changed both up and down in the last few months.  She takes the thyroid hormone: - fasting - with OJ! - starts b'fast immediately after LT4! - fish oil, Ocuvite, vitamin D3, vitamin B12 with b'fast - + calcium (!), multivitamins (!), PPI (Nexium) with b'fast (!)  I reviewed pt's thyroid tests: Lab Results  Component Value Date   TSH 0.18 (L) 02/06/2017   TSH 0.12 (L) 12/23/2016   TSH 0.11 (L) 11/06/2016   TSH 0.13 (L) 09/22/2016   TSH 0.23 (L) 06/19/2016   TSH 1.11 04/24/2016   TSH 0.31 (A) 12/18/2015   TSH 0.10 (A) 11/07/2015   TSH 0.15 (L) 11/01/2015   TSH 2.21 12/12/2014   FREET4 0.87 02/10/2017    Pt denies feeling nodules in neck, hoarseness, dysphagia/odynophagia, SOB with lying down.  She has no FH of thyroid disorders.. + FH of thyroid cancer (half sister by mother). No h/o radiation tx to head or neck - except RAI tx in the 1980s. No recent use of iodine supplements. + steroid inj. In neck earlier in the year (03-07/2016)  Pt. also has a history of epilepsy ( last seizure 1988), HTN, HL, GERD, anxiety + depression - controled on Zoloft. She also has a history of low abdominal pain that has been extensively investigated in the past.  She is seeing Dr. Havery Moros   Had a recent colonoscopy.  ROS: Constitutional: no weight gain/loss, +  fatigue, no subjective hyperthermia/hypothermia Eyes: no blurry vision, no xerophthalmia ENT: no sore throat, no nodules palpated in throat, no dysphagia/odynophagia, no hoarseness, + tinnitus Cardiovascular: no CP/+ SOB/no palpitations/leg swelling Respiratory: no cough/+ SOB Gastrointestinal: no N/V/D/C/+ AP/+ acid reflux Musculoskeletal: +muscle/joint aches Skin: no rashes Neurological: no tremors/numbness/tingling/dizziness Psychiatric: + depression/+ anxiety  Past Medical History:  Diagnosis Date  . ALLERGIC RHINITIS 05/27/2007   uses Flonase daily as needed and ALbuterol inhaler prn;gets Allergy shots  . Allergy   . Anxiety and depression 12/19/2009  . Arthritis   . Cancer (HCC)    vaginal  . Constipation   . Degenerative disorder of eye   . Depression   . Diverticulosis   . Environmental allergies   . Essential hypertension, benign 06/26/2009  . GERD 05/27/2007   takes Nexium daily  . Heart murmur    yrs ago  . History of gout   . History of migraine many yrs ago  . Hyperlipidemia    takes Pravastatin daily  . Hypothyroidism   . Insomnia    but doesn't take any meds  . LOW BACK PAIN 05/27/2007  . Numbness    left toes   . Osteoporosis   . Seizure disorder [facility])    reports remote, takes Tegretol daily, followed by neurology  . Seizures ([place])  last 1983, none since then- last seizure 1988 per pt    Past Surgical History:  Procedure Laterality Date  . ADENOIDECTOMY    . BACK SURGERY     x2  . COLONOSCOPY  2009  . ESOPHAGOGASTRODUODENOSCOPY    . Heel spurs Bilateral   . KNEE ARTHROPLASTY Left 11/07/2013   Procedure: COMPUTER ASSISTED TOTAL KNEE ARTHROPLASTY;  Surgeon: Marybelle Killings, MD;  Location: [place];  Service: Orthopedics;  Laterality: Left;  Left Total Knee Arthroplasty, Cemented, Computer Assist  . TONSILLECTOMY    . TOTAL KNEE REVISION Left 09/19/2014   Procedure: LEFT TOTAL KNEE REVISION;  Surgeon: Leandrew Koyanagi, MD;  Location: [place];  Service: Orthopedics;   Laterality: Left;  . UPPER GASTROINTESTINAL ENDOSCOPY     Social History   Socioeconomic History  . Marital status: Married    Spouse name: Not on file  . Number of children: 1  . Years of education: Not on file  . Highest education level: Not on file  Social Needs  . Financial resource strain: Not on file  . Food insecurity - worry: Not on file  . Food insecurity - inability: Not on file  . Transportation needs - medical: Not on file  . Transportation needs - non-medical: Not on file  Occupational History  . Occupation: Retired  Tobacco Use  . Smoking status: Former Smoker    Packs/day: 0.25    Years: 20.00    Pack years: 5.00    Types: Cigarettes    Last attempt to quit: 09/08/1978    Years since quitting: 38.6  . Smokeless tobacco: Never Used  . Tobacco comment: quit smokikng 12yrs ago  Substance and Sexual Activity  . Alcohol use: No  . Drug use: No  . Sexual activity: No    Birth control/protection: Post-menopausal  Other Topics Concern  . Not on file  Social History Narrative  . Not on file   Current Outpatient Medications on File Prior to Visit  Medication Sig Dispense Refill  . albuterol (PROVENTIL HFA;VENTOLIN HFA) 108 (90 BASE) MCG/ACT inhaler Inhale 1 puff into the lungs every 6 (six) hours as needed for wheezing or shortness of breath.    Marland Kitchen aspirin 81 MG tablet Take 81 mg by mouth daily.    . beta carotene w/minerals (OCUVITE) tablet Take 1 tablet by mouth daily.    . Cyanocobalamin (VITAMIN B 12 PO) Take by mouth daily.      . cyclobenzaprine (FLEXERIL) 5 MG tablet Take 1 tablet (5 mg total) by mouth at bedtime as needed for muscle spasms. 30 tablet 0  . dicyclomine (BENTYL) 10 MG capsule Take 1 capsule (10 mg total) by mouth every 8 (eight) hours as needed for spasms. 90 capsule 3  . esomeprazole (NEXIUM) 20 MG capsule Take 20 mg by mouth daily before breakfast.    . fluticasone (FLONASE) 50 MCG/ACT nasal spray Place 2 sprays into both nostrils daily. 16 g  6  . levocetirizine (XYZAL) 5 MG tablet     . levothyroxine (SYNTHROID, LEVOTHROID) 100 MCG tablet Take 1 tablet (100 mcg total) by mouth daily. 90 tablet 1  . lisinopril (PRINIVIL,ZESTRIL) 5 MG tablet Take 1 tablet (5 mg total) by mouth daily. 90 tablet 1  . Multiple Vitamin (MULTIVITAMIN) capsule Take 1 capsule by mouth daily.      . Omega-3 Fatty Acids (FISH OIL PO) Take by mouth daily.      . ondansetron (ZOFRAN) 4 MG tablet Take 1 tablet (4  mg total) by mouth every 8 (eight) hours as needed for nausea or vomiting. 30 tablet 1  . pravastatin (PRAVACHOL) 20 MG tablet TAKE 1 TABLET(20 MG) BY MOUTH DAILY 90 tablet 1  . senna-docusate (SENOKOT S) 8.6-50 MG per tablet Take 1 tablet by mouth at bedtime as needed. 30 tablet 1  . sertraline (ZOLOFT) 100 MG tablet Take 1 tablet (100 mg total) by mouth daily. (Patient taking differently: Take 125 mg by mouth daily. ) 90 tablet 1  . sertraline (ZOLOFT) 25 MG tablet Take 25 mg by mouth daily.    Marland Kitchen zonisamide (ZONEGRAN) 100 MG capsule Take 200 mg by mouth 2 (two) times daily.     Current Facility-Administered Medications on File Prior to Visit  Medication Dose Route Frequency Provider Last Rate Last Dose  . 0.9 %  sodium chloride infusion  500 mL Intravenous Continuous Armbruster, Carlota Raspberry, MD       Allergies  Allergen Reactions  . Lamotrigine Swelling    Tongue swelling  . Bupropion Other (See Comments)    not known  . Carbamazepine Other (See Comments)    Hypnatremia  . Levetiracetam     Other reaction(s): Dizziness (intolerance)  . Tramadol Other (See Comments)    not known  . Adhesive [Tape]     ?  Blister after knee surgery  . Clarithromycin     Unsure of reaction    . Doxycycline     Interfered with seizure medication  . Nickel     Had to remove knee implant with nickel and replace it  . Other     Metal and perfumes  . Estradiol Rash  . Phenytoin Sodium Extended Other (See Comments)    drowsiness   Family History  Problem  Relation Age of Onset  . Arthritis Mother   . Stroke Mother   . Heart murmur Other   . Colon cancer Neg Hx   . Esophageal cancer Neg Hx   . Stomach cancer Neg Hx   . Rectal cancer Neg Hx   . Colon polyps Neg Hx    PE: BP 126/78   Pulse 70   Ht 5' 5.75" (1.67 m)   Wt 201 lb 6.4 oz (91.4 kg)   SpO2 96%   BMI 32.75 kg/m  Wt Readings from Last 3 Encounters:  04/16/17 201 lb 6.4 oz (91.4 kg)  03/05/17 198 lb (89.8 kg)  02/26/17 201 lb 4.8 oz (91.3 kg)   Constitutional: overweight, in NAD Eyes: PERRLA, EOMI, no exophthalmos ENT: moist mucous membranes, no thyromegaly, no cervical lymphadenopathy Cardiovascular: RRR, No MRG Respiratory: CTA B Gastrointestinal: abdomen soft, NT, ND, BS+ Musculoskeletal: no deformities, strength intact in all 4 Skin: moist, warm, no rashes Neurological: + tremor with outstretched hands, DTR normal in all 4  ASSESSMENT: 1.  Post ablative hypothyroidism - After RAI treatment for Graves' disease   In the 80s.  PLAN:  1. Patient with long-standing uncontrolled hypothyroidism, on levothyroxine therapy.  She has been on a high dose of levothyroxine in the past with persistently suppressed TSH levels. She is currently on 100 mcg levothyroxine daily, recently decreased from 112 mcg daily.  However, she is not taking this correctly, taking it with orange juice, not separated enough from breakfast, and also taking the PPIs, calcium, multivitamins with breakfast.   - We discussed about correct intake of levothyroxine, fasting, with water, separated by at least 30 minutes from breakfast, and separated by more than 4 hours from calcium, iron, multivitamins,  acid reflux medications (PPIs).  I advised her to start taking the levothyroxine with water, to a 30 minutes before she eats breakfast and move calcium, multivitamins, and PPIs at least 4 hours after levothyroxine. - she does not appear to have a goiter, thyroid nodules, or neck compression symptoms.  No history  of thyroid nodules.  - she appears euthyroid,   But complains of fatigue and she has tremors.  No tachycardia, palpitations. - will check thyroid tests today: TSH, free T4 - After the results are back, since we are separating  her levothyroxine from the above medications, we probably will need to decrease her dose further to 88 mcg daily, but for now, I advised her to stay on the 100 mcg dose until we get the results back - I will see her back in 4 months  Component     Latest Ref Rng & Units 04/16/2017  TSH     0.35 - 4.50 uIU/mL 1.25  T4,Free(Direct)     0.60 - 1.60 ng/dL 0.77   Thyroid tests are normal.  We will continue the current dose of 100 mcg daily and recheck her TFTs in 5 weeks.  Philemon Kingdom, MD PhD [facility]

## 2017-04-16 NOTE — Patient Instructions (Addendum)
Please continue Levothyroxine 100 mcg daily.  Take the thyroid hormone every day, with water, at least 30 minutes before breakfast, separated by at least 4 hours from: - acid reflux medications - calcium - iron - multivitamins  Please move the Nexium, calcium, multivitamins 4h after levothyroxine.  Please stop at the lab.  Please return in 4 months.

## 2017-04-17 LAB — T4, FREE: FREE T4: 0.77 ng/dL (ref 0.60–1.60)

## 2017-04-17 LAB — TSH: TSH: 1.25 u[IU]/mL (ref 0.35–4.50)

## 2017-04-28 DIAGNOSIS — J3081 Allergic rhinitis due to animal (cat) (dog) hair and dander: Secondary | ICD-10-CM | POA: Diagnosis not present

## 2017-04-28 DIAGNOSIS — J3089 Other allergic rhinitis: Secondary | ICD-10-CM | POA: Diagnosis not present

## 2017-04-28 DIAGNOSIS — J301 Allergic rhinitis due to pollen: Secondary | ICD-10-CM | POA: Diagnosis not present

## 2017-04-30 ENCOUNTER — Encounter: Payer: Self-pay | Admitting: Family Medicine

## 2017-05-04 ENCOUNTER — Ambulatory Visit (INDEPENDENT_AMBULATORY_CARE_PROVIDER_SITE_OTHER): Payer: Medicare HMO | Admitting: Family Medicine

## 2017-05-04 ENCOUNTER — Ambulatory Visit (INDEPENDENT_AMBULATORY_CARE_PROVIDER_SITE_OTHER)
Admission: RE | Admit: 2017-05-04 | Discharge: 2017-05-04 | Disposition: A | Discharge status: Home / Self Care | Payer: Medicare HMO | Source: Ambulatory Visit | Attending: Family Medicine | Admitting: Family Medicine

## 2017-05-04 ENCOUNTER — Other Ambulatory Visit: Payer: Self-pay

## 2017-05-04 ENCOUNTER — Encounter: Payer: Self-pay | Admitting: Family Medicine

## 2017-05-04 VITALS — BP 128/60 | HR 66 | Temp 97.5°F | Ht 65.75 in | Wt 203.5 lb

## 2017-05-04 DIAGNOSIS — R69 Illness, unspecified: Secondary | ICD-10-CM | POA: Diagnosis not present

## 2017-05-04 DIAGNOSIS — R05 Cough: Secondary | ICD-10-CM | POA: Diagnosis not present

## 2017-05-04 DIAGNOSIS — R1030 Lower abdominal pain, unspecified: Secondary | ICD-10-CM | POA: Diagnosis not present

## 2017-05-04 DIAGNOSIS — R0602 Shortness of breath: Secondary | ICD-10-CM | POA: Diagnosis not present

## 2017-05-04 DIAGNOSIS — R059 Cough, unspecified: Secondary | ICD-10-CM

## 2017-05-04 DIAGNOSIS — G40909 Epilepsy, unspecified, not intractable, without status epilepticus: Secondary | ICD-10-CM

## 2017-05-04 DIAGNOSIS — F3341 Major depressive disorder, recurrent, in partial remission: Secondary | ICD-10-CM

## 2017-05-04 MED ORDER — PREDNISONE 20 MG PO TABS: 40.0000 mg | ORAL_TABLET | Freq: Every day | ORAL | 0 refills | Status: DC

## 2017-05-04 NOTE — Progress Notes (Signed)
HPI:  Acute visit for cough and congestion: -Started 2 weeks ago -She has had cough, congestion, mild lower rib pain, feels tired at times -Denies fevers, malaise, shortness of breath, wheezing  Seeing endocrinologist about her thyroid and this is improved.  Seeing GI about her left lower quadrant intermittent pain and nausea.  Reports workup has been good so far, but still has symptoms.  Plans to follow-up with him soon.  Reports saw her gynecologist for evaluation as well and this is normal.  Mood stable  ROS: See pertinent positives and negatives per HPI.  Past Medical History:  Diagnosis Date  . ALLERGIC RHINITIS 05/27/2007   uses Flonase daily as needed and ALbuterol inhaler prn;gets Allergy shots  . Allergy   . Anxiety and depression 12/19/2009  . Arthritis   . Cancer (HCC)    vaginal  . Constipation   . Degenerative disorder of eye   . Depression   . Diverticulosis   . Environmental allergies   . Essential hypertension, benign 06/26/2009  . GERD 05/27/2007   takes Nexium daily  . Heart murmur    yrs ago  . History of gout   . History of migraine many yrs ago  . Hyperlipidemia    takes Pravastatin daily  . Hypothyroidism   . Insomnia    but doesn't take any meds  . LOW BACK PAIN 05/27/2007  . Numbness    left toes   . Osteoporosis   . Seizure disorder [facility])    reports remote, takes Tegretol daily, followed by neurology  . Seizures ([place])    last 1983, none since then- last seizure 1988 per pt     Past Surgical History:  Procedure Laterality Date  . ADENOIDECTOMY    . BACK SURGERY     x2  . COLONOSCOPY  2009  . ESOPHAGOGASTRODUODENOSCOPY    . Heel spurs Bilateral   . KNEE ARTHROPLASTY Left 11/07/2013   Procedure: COMPUTER ASSISTED TOTAL KNEE ARTHROPLASTY;  Surgeon: Marybelle Killings, MD;  Location: [place];  Service: Orthopedics;  Laterality: Left;  Left Total Knee Arthroplasty, Cemented, Computer Assist  . TONSILLECTOMY    . TOTAL KNEE REVISION Left 09/19/2014   Procedure: LEFT TOTAL KNEE REVISION;  Surgeon: Leandrew Koyanagi, MD;  Location: [place];  Service: Orthopedics;  Laterality: Left;  . UPPER GASTROINTESTINAL ENDOSCOPY      Family History  Problem Relation Age of Onset  . Arthritis Mother   . Stroke Mother   . Heart murmur Other   . Colon cancer Neg Hx   . Esophageal cancer Neg Hx   . Stomach cancer Neg Hx   . Rectal cancer Neg Hx   . Colon polyps Neg Hx     Social History   Socioeconomic History  . Marital status: Married    Spouse name: None  . Number of children: 1  . Years of education: None  . Highest education level: None  Social Needs  . Financial resource strain: None  . Food insecurity - worry: None  . Food insecurity - inability: None  . Transportation needs - medical: None  . Transportation needs - non-medical: None  Occupational History  . Occupation: Retired  Tobacco Use  . Smoking status: Former Smoker    Packs/day: 0.25    Years: 20.00    Pack years: 5.00    Types: Cigarettes    Last attempt to quit: 09/08/1978    Years since quitting: 38.6  . Smokeless tobacco: Never Used  .  Tobacco comment: quit smokikng 52yrs ago  Substance and Sexual Activity  . Alcohol use: No  . Drug use: No  . Sexual activity: No    Birth control/protection: Post-menopausal  Other Topics Concern  . None  Social History Narrative  . None     Current Outpatient Medications:  .  albuterol (PROVENTIL HFA;VENTOLIN HFA) 108 (90 BASE) MCG/ACT inhaler, Inhale 1 puff into the lungs every 6 (six) hours as needed for wheezing or shortness of breath., Disp: , Rfl:  .  aspirin 81 MG tablet, Take 81 mg by mouth daily., Disp: , Rfl:  .  beta carotene w/minerals (OCUVITE) tablet, Take 1 tablet by mouth daily., Disp: , Rfl:  .  Cyanocobalamin (VITAMIN B 12 PO), Take by mouth daily.  , Disp: , Rfl:  .  cyclobenzaprine (FLEXERIL) 5 MG tablet, Take 1 tablet (5 mg total) by mouth at bedtime as needed for muscle spasms., Disp: 30 tablet, Rfl: 0 .   dicyclomine (BENTYL) 10 MG capsule, Take 1 capsule (10 mg total) by mouth every 8 (eight) hours as needed for spasms., Disp: 90 capsule, Rfl: 3 .  esomeprazole (NEXIUM) 20 MG capsule, Take 20 mg by mouth daily before breakfast., Disp: , Rfl:  .  fluticasone (FLONASE) 50 MCG/ACT nasal spray, Place 2 sprays into both nostrils daily., Disp: 16 g, Rfl: 6 .  levocetirizine (XYZAL) 5 MG tablet, , Disp: , Rfl:  .  levothyroxine (SYNTHROID, LEVOTHROID) 100 MCG tablet, Take 1 tablet (100 mcg total) by mouth daily., Disp: 90 tablet, Rfl: 1 .  lisinopril (PRINIVIL,ZESTRIL) 5 MG tablet, Take 1 tablet (5 mg total) by mouth daily., Disp: 90 tablet, Rfl: 1 .  Multiple Vitamin (MULTIVITAMIN) capsule, Take 1 capsule by mouth daily.  , Disp: , Rfl:  .  Omega-3 Fatty Acids (FISH OIL PO), Take by mouth daily.  , Disp: , Rfl:  .  ondansetron (ZOFRAN) 4 MG tablet, Take 1 tablet (4 mg total) by mouth every 8 (eight) hours as needed for nausea or vomiting., Disp: 30 tablet, Rfl: 1 .  pravastatin (PRAVACHOL) 20 MG tablet, TAKE 1 TABLET(20 MG) BY MOUTH DAILY, Disp: 90 tablet, Rfl: 1 .  senna-docusate (SENOKOT S) 8.6-50 MG per tablet, Take 1 tablet by mouth at bedtime as needed., Disp: 30 tablet, Rfl: 1 .  sertraline (ZOLOFT) 100 MG tablet, Take 1 tablet (100 mg total) by mouth daily. (Patient taking differently: Take 125 mg by mouth daily. ), Disp: 90 tablet, Rfl: 1 .  sertraline (ZOLOFT) 25 MG tablet, Take 25 mg by mouth daily., Disp: , Rfl:  .  zonisamide (ZONEGRAN) 100 MG capsule, Take 200 mg by mouth 2 (two) times daily., Disp: , Rfl:  .  predniSONE (DELTASONE) 20 MG tablet, Take 2 tablets (40 mg total) by mouth daily with breakfast., Disp: 8 tablet, Rfl: 0  Current Facility-Administered Medications:  .  0.9 %  sodium chloride infusion, 500 mL, Intravenous, Continuous, Armbruster, Carlota Raspberry, MD  EXAM:  Vitals:   05/04/17 1148  BP: 128/60  Pulse: 66  Temp: (!) 97.5 F (36.4 C)  SpO2: 97%    Body mass index is  33.1 kg/m.  GENERAL: vitals reviewed and listed above, alert, oriented, appears well hydrated and in no acute distress  HEENT: atraumatic, conjunttiva clear, no obvious abnormalities on inspection of external nose and ears  NECK: no obvious masses on inspection  LUNGS: clear to auscultation bilaterally, no wheezes, rales or rhonchi, good air movement, isolated expiratory wheeze left lower lung  CV: HRRR, no peripheral edema  MS: moves all extremities without noticeable abnormality  PSYCH: pleasant and cooperative, no obvious depression or anxiety  ASSESSMENT AND PLAN:  Discussed the following assessment and plan:  Cough - Plan: DG Abd 2 Views  Seizure disorder ([place]), Chronic  Recurrent major depressive disorder, in partial remission ([place]), Chronic  -We will get a chest x-ray for the cough, suspect possible bronchitis, treat accordingly, prednisone for wheeze and likely bronchitis -Return precautions if not improving -Disease specialist for her seizure disorder -Mood stable -Follow-up with GI about the LLQ discomfort and nausea issues - ? ibs -Patient advised to return or notify a doctor immediately if symptoms worsen or persist or new concerns arise.  Patient Instructions  BEFORE YOU LEAVE: -X-ray sheet -follow up: 3 months  Prednisone sent to pharmacy.  May need antibiotic if the x-ray shows pneumonia.  Follow-up with your gastroenterologist about the stomach and nausea issues.  I hope you are feeling better soon! Seek care promptly if your symptoms worsen, new concerns arise or you are not improving with treatment.            Colin Benton R., DO

## 2017-05-04 NOTE — Patient Instructions (Addendum)
BEFORE YOU LEAVE: -PHQ9 -X-ray sheet -follow up: 3 months  Prednisone sent to pharmacy.  May need antibiotic if the x-ray shows pneumonia.  Follow-up with your gastroenterologist about the stomach and nausea issues.  I hope you are feeling better soon! Seek care promptly if your symptoms worsen, new concerns arise or you are not improving with treatment.

## 2017-05-11 DIAGNOSIS — J3089 Other allergic rhinitis: Secondary | ICD-10-CM | POA: Diagnosis not present

## 2017-05-11 DIAGNOSIS — J301 Allergic rhinitis due to pollen: Secondary | ICD-10-CM | POA: Diagnosis not present

## 2017-05-11 DIAGNOSIS — J3081 Allergic rhinitis due to animal (cat) (dog) hair and dander: Secondary | ICD-10-CM | POA: Diagnosis not present

## 2017-05-13 ENCOUNTER — Ambulatory Visit: Payer: Medicare HMO | Admitting: Psychology

## 2017-05-13 DIAGNOSIS — R69 Illness, unspecified: Secondary | ICD-10-CM | POA: Diagnosis not present

## 2017-05-13 DIAGNOSIS — F411 Generalized anxiety disorder: Secondary | ICD-10-CM | POA: Diagnosis not present

## 2017-05-13 DIAGNOSIS — F330 Major depressive disorder, recurrent, mild: Secondary | ICD-10-CM | POA: Diagnosis not present

## 2017-05-13 DIAGNOSIS — J301 Allergic rhinitis due to pollen: Secondary | ICD-10-CM | POA: Diagnosis not present

## 2017-05-13 DIAGNOSIS — J3081 Allergic rhinitis due to animal (cat) (dog) hair and dander: Secondary | ICD-10-CM | POA: Diagnosis not present

## 2017-05-13 DIAGNOSIS — J3089 Other allergic rhinitis: Secondary | ICD-10-CM | POA: Diagnosis not present

## 2017-05-21 DIAGNOSIS — J3089 Other allergic rhinitis: Secondary | ICD-10-CM | POA: Diagnosis not present

## 2017-05-21 DIAGNOSIS — J301 Allergic rhinitis due to pollen: Secondary | ICD-10-CM | POA: Diagnosis not present

## 2017-05-21 DIAGNOSIS — J3081 Allergic rhinitis due to animal (cat) (dog) hair and dander: Secondary | ICD-10-CM | POA: Diagnosis not present

## 2017-05-28 ENCOUNTER — Ambulatory Visit: Payer: Self-pay | Admitting: Family Medicine

## 2017-06-04 ENCOUNTER — Ambulatory Visit (INDEPENDENT_AMBULATORY_CARE_PROVIDER_SITE_OTHER): Payer: Medicare HMO | Admitting: Family Medicine

## 2017-06-04 ENCOUNTER — Encounter: Payer: Self-pay | Admitting: Family Medicine

## 2017-06-04 VITALS — BP 120/66 | HR 63 | Temp 98.5°F | Ht 65.75 in | Wt 202.9 lb

## 2017-06-04 DIAGNOSIS — M545 Low back pain: Secondary | ICD-10-CM

## 2017-06-04 DIAGNOSIS — J3081 Allergic rhinitis due to animal (cat) (dog) hair and dander: Secondary | ICD-10-CM | POA: Diagnosis not present

## 2017-06-04 DIAGNOSIS — J301 Allergic rhinitis due to pollen: Secondary | ICD-10-CM | POA: Diagnosis not present

## 2017-06-04 DIAGNOSIS — G8929 Other chronic pain: Secondary | ICD-10-CM

## 2017-06-04 DIAGNOSIS — J3089 Other allergic rhinitis: Secondary | ICD-10-CM | POA: Diagnosis not present

## 2017-06-04 MED ORDER — CYCLOBENZAPRINE HCL 5 MG PO TABS: 5.0000 mg | ORAL_TABLET | Freq: Every evening | ORAL | 0 refills | Status: DC | PRN

## 2017-06-04 NOTE — Progress Notes (Signed)
HPI:  Acute visit for back pain: -Started acutely several days ago - doing better today, but she reports a long history of similar intermittent back pain -History of several surgeries-on the back -She cannot think of triggering event -Pain is in the bilateral low back, is a 5 out of 10 in terms of pain level, does not radiate Denies weakness,numbness, bowel or bladder incontinence, fevers or malaise, urinary symptoms or bowel issues.  Reports bowel issues have resolved.  ROS: See pertinent positives and negatives per HPI.  Past Medical History:  Diagnosis Date  . ALLERGIC RHINITIS 05/27/2007   uses Flonase daily as needed and ALbuterol inhaler prn;gets Allergy shots  . Allergy   . Anxiety and depression 12/19/2009  . Arthritis   . Cancer (HCC)    vaginal  . Constipation   . Degenerative disorder of eye   . Depression   . Diverticulosis   . Environmental allergies   . Essential hypertension, benign 06/26/2009  . GERD 05/27/2007   takes Nexium daily  . Heart murmur    yrs ago  . History of gout   . History of migraine many yrs ago  . Hyperlipidemia    takes Pravastatin daily  . Hypothyroidism   . Insomnia    but doesn't take any meds  . LOW BACK PAIN 05/27/2007  . Numbness    left toes   . Osteoporosis   . Seizure disorder [facility])    reports remote, takes Tegretol daily, followed by neurology  . Seizures ([place])    last 1983, none since then- last seizure 1988 per pt     Past Surgical History:  Procedure Laterality Date  . ADENOIDECTOMY    . BACK SURGERY     x2  . COLONOSCOPY  2009  . ESOPHAGOGASTRODUODENOSCOPY    . Heel spurs Bilateral   . KNEE ARTHROPLASTY Left 11/07/2013   Procedure: COMPUTER ASSISTED TOTAL KNEE ARTHROPLASTY;  Surgeon: Marybelle Killings, MD;  Location: [place];  Service: Orthopedics;  Laterality: Left;  Left Total Knee Arthroplasty, Cemented, Computer Assist  . TONSILLECTOMY    . TOTAL KNEE REVISION Left 09/19/2014   Procedure: LEFT TOTAL KNEE REVISION;   Surgeon: Leandrew Koyanagi, MD;  Location: [place];  Service: Orthopedics;  Laterality: Left;  . UPPER GASTROINTESTINAL ENDOSCOPY      Family History  Problem Relation Age of Onset  . Arthritis Mother   . Stroke Mother   . Heart murmur Other   . Colon cancer Neg Hx   . Esophageal cancer Neg Hx   . Stomach cancer Neg Hx   . Rectal cancer Neg Hx   . Colon polyps Neg Hx     Social History   Socioeconomic History  . Marital status: Married    Spouse name: None  . Number of children: 1  . Years of education: None  . Highest education level: None  Social Needs  . Financial resource strain: None  . Food insecurity - worry: None  . Food insecurity - inability: None  . Transportation needs - medical: None  . Transportation needs - non-medical: None  Occupational History  . Occupation: Retired  Tobacco Use  . Smoking status: Former Smoker    Packs/day: 0.25    Years: 20.00    Pack years: 5.00    Types: Cigarettes    Last attempt to quit: 09/08/1978    Years since quitting: 38.7  . Smokeless tobacco: Never Used  . Tobacco comment: quit smokikng 42yrs ago  Substance and Sexual Activity  . Alcohol use: No  . Drug use: No  . Sexual activity: No    Birth control/protection: Post-menopausal  Other Topics Concern  . None  Social History Narrative  . None     Current Outpatient Medications:  .  albuterol (PROVENTIL HFA;VENTOLIN HFA) 108 (90 BASE) MCG/ACT inhaler, Inhale 1 puff into the lungs every 6 (six) hours as needed for wheezing or shortness of breath., Disp: , Rfl:  .  aspirin 81 MG tablet, Take 81 mg by mouth daily., Disp: , Rfl:  .  beta carotene w/minerals (OCUVITE) tablet, Take 1 tablet by mouth daily., Disp: , Rfl:  .  Cyanocobalamin (VITAMIN B 12 PO), Take by mouth daily.  , Disp: , Rfl:  .  dicyclomine (BENTYL) 10 MG capsule, Take 1 capsule (10 mg total) by mouth every 8 (eight) hours as needed for spasms., Disp: 90 capsule, Rfl: 3 .  esomeprazole (NEXIUM) 20 MG  capsule, Take 20 mg by mouth daily before breakfast., Disp: , Rfl:  .  fluticasone (FLONASE) 50 MCG/ACT nasal spray, Place 2 sprays into both nostrils daily., Disp: 16 g, Rfl: 6 .  levocetirizine (XYZAL) 5 MG tablet, , Disp: , Rfl:  .  levothyroxine (SYNTHROID, LEVOTHROID) 100 MCG tablet, Take 1 tablet (100 mcg total) by mouth daily., Disp: 90 tablet, Rfl: 1 .  lisinopril (PRINIVIL,ZESTRIL) 5 MG tablet, Take 1 tablet (5 mg total) by mouth daily., Disp: 90 tablet, Rfl: 1 .  Multiple Vitamin (MULTIVITAMIN) capsule, Take 1 capsule by mouth daily.  , Disp: , Rfl:  .  Omega-3 Fatty Acids (FISH OIL PO), Take by mouth daily.  , Disp: , Rfl:  .  ondansetron (ZOFRAN) 4 MG tablet, Take 1 tablet (4 mg total) by mouth every 8 (eight) hours as needed for nausea or vomiting., Disp: 30 tablet, Rfl: 1 .  pravastatin (PRAVACHOL) 20 MG tablet, TAKE 1 TABLET(20 MG) BY MOUTH DAILY, Disp: 90 tablet, Rfl: 1 .  predniSONE (DELTASONE) 20 MG tablet, Take 2 tablets (40 mg total) by mouth daily with breakfast., Disp: 8 tablet, Rfl: 0 .  senna-docusate (SENOKOT S) 8.6-50 MG per tablet, Take 1 tablet by mouth at bedtime as needed., Disp: 30 tablet, Rfl: 1 .  sertraline (ZOLOFT) 100 MG tablet, Take 1 tablet (100 mg total) by mouth daily. (Patient taking differently: Take 125 mg by mouth daily. ), Disp: 90 tablet, Rfl: 1 .  sertraline (ZOLOFT) 25 MG tablet, Take 25 mg by mouth daily., Disp: , Rfl:  .  zonisamide (ZONEGRAN) 100 MG capsule, Take 200 mg by mouth 2 (two) times daily., Disp: , Rfl:  .  cyclobenzaprine (FLEXERIL) 5 MG tablet, Take 1 tablet (5 mg total) by mouth at bedtime as needed for muscle spasms., Disp: 20 tablet, Rfl: 0  Current Facility-Administered Medications:  .  0.9 %  sodium chloride infusion, 500 mL, Intravenous, Continuous, Armbruster, Carlota Raspberry, MD  EXAM:  Vitals:   06/04/17 1651  BP: 120/66  Pulse: 63  Temp: 98.5 F (36.9 C)    Body mass index is 33 kg/m.  GENERAL: vitals reviewed and  listed above, alert, oriented, appears well hydrated and in no acute distress  HEENT: atraumatic, conjunttiva clear, no obvious abnormalities on inspection of external nose and ears  NECK: no obvious masses on inspection  LUNGS: clear to auscultation bilaterally, no wheezes, rales or rhonchi, good air movement  CV: HRRR, no peripheral edema  MS: moves all extremities without noticeable abnormality Normal Gait Normal  inspection of back, no obvious scoliosis or leg length descrepancy No bony TTP Soft tissue TTP at: Bilateral lumbar paraspinal muscles, no bony tenderness to palpation -/+ tests: neg trendelenburg,-facet loading, -SLRT, -CLRT, -FABER, -FADIR Normal muscle strength, sensation to light touch and DTRs in LEs bilaterally PSYCH: pleasant and cooperative, no obvious depression or anxiety  ASSESSMENT AND PLAN:  Discussed the following assessment and plan:  Chronic bilateral low back pain without sciatica  -we discussed possible serious and likely etiologies, workup and treatment, treatment risks and return precautions - no alarm features and no sig neurodeficits on exam -after this discussion, Beverly Ballard opted for conservative care with muscle relaxer, heat, gentle activity, symptomatic over-the-counter care and follow-up with her back specialist if worsening or new concerns, consider imaging if not continuing to improve -follow up advised here in 1 month -of course, we advised Carmelle  to return or notify a doctor immediately if symptoms worsen or persist or new concerns arise.  Patient Instructions  BEFORE YOU LEAVE: -low back exercises -follow up: 1 month  Do the exercises 4 days per week as tolerated.  For pain: heat (rice sack), topical methol (such as tiger balm), aleve as needed per instructions, muscle relaxer if needed per instructions  See your back specialist or follow-up promptly if pain is worsening, new symptoms or you not improving with treatment.    Beverly Kern, DO

## 2017-06-04 NOTE — Patient Instructions (Addendum)
BEFORE YOU LEAVE: -low back exercises -follow up: 1 month  Do the exercises 4 days per week as tolerated.  For pain: heat (rice sack), topical methol (such as tiger balm), aleve as needed per instructions, muscle relaxer if needed per instructions  See your back specialist or follow-up promptly if pain is worsening, new symptoms or you not improving with treatment.

## 2017-06-10 ENCOUNTER — Ambulatory Visit (INDEPENDENT_AMBULATORY_CARE_PROVIDER_SITE_OTHER): Payer: Medicare HMO | Admitting: Physician Assistant

## 2017-06-10 ENCOUNTER — Ambulatory Visit (INDEPENDENT_AMBULATORY_CARE_PROVIDER_SITE_OTHER): Payer: Medicare HMO

## 2017-06-10 ENCOUNTER — Encounter (INDEPENDENT_AMBULATORY_CARE_PROVIDER_SITE_OTHER): Payer: Self-pay | Admitting: Physician Assistant

## 2017-06-10 DIAGNOSIS — M5416 Radiculopathy, lumbar region: Secondary | ICD-10-CM | POA: Diagnosis not present

## 2017-06-10 DIAGNOSIS — M5412 Radiculopathy, cervical region: Secondary | ICD-10-CM | POA: Diagnosis not present

## 2017-06-10 MED ORDER — MELOXICAM 7.5 MG PO TABS: ORAL_TABLET | ORAL | 2 refills | Status: DC

## 2017-06-10 MED ORDER — PREDNISONE 10 MG (21) PO TBPK: ORAL_TABLET | ORAL | 0 refills | Status: DC

## 2017-06-10 NOTE — Progress Notes (Signed)
Office Visit Note   Patient: Beverly Ballard           Date of Birth: 11/17/1945           MRN: [id number] Visit Date: 06/10/2017              Requested by: Lucretia Kern, DO [address] [place], [place] [address] PCP: Lucretia Kern, DO   Assessment & Plan: Visit Diagnoses:  1. Lumbar radiculopathy   2. Radiculopathy of cervical spine     Plan: Impression is #1 lumbar radiculopathy.  #2 cervical spine radiculopathy.  Today, we will call in a Sterapred Dosepak.  She has been instructed to stop taking the Aleve.  I have also called in a prescription for Mobic which she will start as needed once she finishes the [employer].  We will also send her to outpatient physical therapy.  If she is not any better in the next 4-6 weeks she will call and let me know and we will likely get an MRI of her neck/back depending on what symptomatic.  Follow-Up Instructions: Return if symptoms worsen or fail to improve.   Orders:  Orders Placed This Encounter  Procedures  . XR Lumbar Spine 2-3 Views  . XR Cervical Spine 2 or 3 views   Meds ordered this encounter  Medications  . predniSONE (STERAPRED UNI-PAK 21 TAB) 10 MG (21) TBPK tablet    Sig: Take as directed    Dispense:  21 tablet    Refill:  0    Order Specific Question:   Supervising Provider    Answer:   Leandrew Koyanagi [phone]  . meloxicam (MOBIC) 7.5 MG tablet    Sig: Take one tab po qd prn pain (do not start until finished with sterapred dosepak)    Dispense:  30 tablet    Refill:  2    Order Specific Question:   Supervising Provider    Answer:   Leandrew Koyanagi [[id number]]      Procedures: No procedures performed   Clinical Data: No additional findings.   Subjective: Chief Complaint  Patient presents with  . Lower Back - Pain    HPI Beverly Ballard is a pleasant 72 year old female who presents our clinic today with lower back pain radiating down her left leg as well as neck pain radiating to both upper extremities.   This is been ongoing for the past 3 months.  No known injury or change in activity.  The pain she has in her lower back is across the entire lower lumbar region.  This extends down the lateral aspect of her left leg into her foot.  She describes numbness to the bottom of her foot.  Pain is worse when driving.  She is tried taking 2 Aleve on a daily basis as well as Flexeril at night with minimal relief of symptoms.  No saddle paresthesias, no bowel or bladder incontinence.  Of note, she does have a history of lumbar fusion L4-5 by Dr. Maxie Better several years back with a revision by Dr. Lorin Mercy.  In regards to the neck, this is been ongoing for the past 3 months as well.  No known injury or change in activity.  The pain she has is to the middle of her cervical spine and radiates to the top of both shoulders and into the deltoid.  She does not note any numbness tingling burning to her upper extremities.  There is nothing that provokes the pain.  No previous cervical spine history.  Review of Systems as detailed in HPI.  All others reviewed and are negative.   Objective: Vital Signs: There were no vitals taken for this visit.  Physical Exam well-developed well-nourished female in no acute distress.  Alert and oriented x3.  Ortho Exam exam of her lower back reveals increased pain with flexion, extension.  Positive straight leg raise on the left, negative on the right.  She does exhibit minimal spinous and paraspinous tenderness to the lumbar spine.  Examination of her cervical spine reveals limited range of motion secondary to pain and stiffness.  She does have minimal spinous tenderness.  Full and painless range of motion of the shoulders.  She is neurovascular intact distally.  Specialty Comments:  No specialty comments available.  Imaging: Xr Cervical Spine 2 Or 3 Views  Result Date: 06/10/2017 X-rays of the cervical spine reveal marked degenerative changes with anterior spurring C5-6 and C6-7  Xr Lumbar  Spine 2-3 Views  Result Date: 06/10/2017 X-rays of the lumbar spine reveal a previous fusion at L4-5.  She has moderate degenerative disc disease at L3-4 and L5-S1.    PMFS History: Patient Active Problem List   Diagnosis Date Noted  . Neck pain 06/10/2017  . Recurrent major depressive disorder, in partial remission ([place]) 05/04/2017  . Trochanteric bursitis, left hip 01/12/2017  . Chronic pain of left knee 01/12/2017  . Ovarian cyst 10/30/2016  . Aortic atherosclerosis ([place]) 11/19/2015  . Hyperlipemia 04/03/2015  . S/P revision of total knee 09/19/2014  . Seizure disorder ([place]) 01/24/2014  . Obesity, BMI unknown 01/24/2014  . Osteoarthritis of left knee 11/07/2013  . Osteopenia, stable on dexa 2010 - followed by gyn 06/15/2012  . Pap smear abnormality of cervix/human papillomavirus (HPV) positive - 09/2011, followed by gyn 06/15/2012  . Essential hypertension 07/03/2009  . Hypothyroidism 05/27/2007  . Allergic rhinitis 05/27/2007  . GERD 05/27/2007  . Chronic low back pain 05/27/2007   Past Medical History:  Diagnosis Date  . ALLERGIC RHINITIS 05/27/2007   uses Flonase daily as needed and ALbuterol inhaler prn;gets Allergy shots  . Allergy   . Anxiety and depression 12/19/2009  . Arthritis   . Cancer (HCC)    vaginal  . Constipation   . Degenerative disorder of eye   . Depression   . Diverticulosis   . Environmental allergies   . Essential hypertension, benign 06/26/2009  . GERD 05/27/2007   takes Nexium daily  . Heart murmur    yrs ago  . History of gout   . History of migraine many yrs ago  . Hyperlipidemia    takes Pravastatin daily  . Hypothyroidism   . Insomnia    but doesn't take any meds  . LOW BACK PAIN 05/27/2007  . Numbness    left toes   . Osteoporosis   . Seizure disorder [facility])    reports remote, takes Tegretol daily, followed by neurology  . Seizures ([place])    last 1983, none since then- last seizure 1988 per pt     Family History  Problem Relation  Age of Onset  . Arthritis Mother   . Stroke Mother   . Heart murmur Other   . Colon cancer Neg Hx   . Esophageal cancer Neg Hx   . Stomach cancer Neg Hx   . Rectal cancer Neg Hx   . Colon polyps Neg Hx     Past Surgical History:  Procedure Laterality Date  . ADENOIDECTOMY    .  BACK SURGERY     x2  . COLONOSCOPY  2009  . ESOPHAGOGASTRODUODENOSCOPY    . Heel spurs Bilateral   . KNEE ARTHROPLASTY Left 11/07/2013   Procedure: COMPUTER ASSISTED TOTAL KNEE ARTHROPLASTY;  Surgeon: Marybelle Killings, MD;  Location: [place];  Service: Orthopedics;  Laterality: Left;  Left Total Knee Arthroplasty, Cemented, Computer Assist  . TONSILLECTOMY    . TOTAL KNEE REVISION Left 09/19/2014   Procedure: LEFT TOTAL KNEE REVISION;  Surgeon: Leandrew Koyanagi, MD;  Location: [place];  Service: Orthopedics;  Laterality: Left;  . UPPER GASTROINTESTINAL ENDOSCOPY     Social History   Occupational History  . Occupation: Retired  Tobacco Use  . Smoking status: Former Smoker    Packs/day: 0.25    Years: 20.00    Pack years: 5.00    Types: Cigarettes    Last attempt to quit: 09/08/1978    Years since quitting: 38.7  . Smokeless tobacco: Never Used  . Tobacco comment: quit smokikng 77yrs ago  Substance and Sexual Activity  . Alcohol use: No  . Drug use: No  . Sexual activity: No    Birth control/protection: Post-menopausal

## 2017-06-11 DIAGNOSIS — J3089 Other allergic rhinitis: Secondary | ICD-10-CM | POA: Diagnosis not present

## 2017-06-11 DIAGNOSIS — J3081 Allergic rhinitis due to animal (cat) (dog) hair and dander: Secondary | ICD-10-CM | POA: Diagnosis not present

## 2017-06-11 DIAGNOSIS — J301 Allergic rhinitis due to pollen: Secondary | ICD-10-CM | POA: Diagnosis not present

## 2017-06-15 ENCOUNTER — Telehealth: Payer: Self-pay | Admitting: *Deleted

## 2017-06-15 NOTE — Telephone Encounter (Signed)
Prior auth for Cyclobenzaprine 5mg  sent to [email or web address].

## 2017-06-18 DIAGNOSIS — J3089 Other allergic rhinitis: Secondary | ICD-10-CM | POA: Diagnosis not present

## 2017-06-18 DIAGNOSIS — J3081 Allergic rhinitis due to animal (cat) (dog) hair and dander: Secondary | ICD-10-CM | POA: Diagnosis not present

## 2017-06-18 DIAGNOSIS — J301 Allergic rhinitis due to pollen: Secondary | ICD-10-CM | POA: Diagnosis not present

## 2017-06-18 NOTE — Telephone Encounter (Signed)
A fax was received from [facility] stating the Rx was approved from 04/19/2017-04/20/2018.  I called [facility] and informed Darnelle Maffucci of this.

## 2017-06-20 ENCOUNTER — Other Ambulatory Visit: Payer: Self-pay | Admitting: Family Medicine

## 2017-06-22 ENCOUNTER — Ambulatory Visit: Payer: Medicare HMO | Admitting: Psychology

## 2017-06-22 DIAGNOSIS — F330 Major depressive disorder, recurrent, mild: Secondary | ICD-10-CM | POA: Diagnosis not present

## 2017-06-22 DIAGNOSIS — J3081 Allergic rhinitis due to animal (cat) (dog) hair and dander: Secondary | ICD-10-CM | POA: Diagnosis not present

## 2017-06-22 DIAGNOSIS — J301 Allergic rhinitis due to pollen: Secondary | ICD-10-CM | POA: Diagnosis not present

## 2017-06-22 DIAGNOSIS — F411 Generalized anxiety disorder: Secondary | ICD-10-CM | POA: Diagnosis not present

## 2017-06-22 DIAGNOSIS — R69 Illness, unspecified: Secondary | ICD-10-CM | POA: Diagnosis not present

## 2017-06-22 DIAGNOSIS — J3089 Other allergic rhinitis: Secondary | ICD-10-CM | POA: Diagnosis not present

## 2017-06-23 ENCOUNTER — Other Ambulatory Visit: Payer: Self-pay

## 2017-06-23 ENCOUNTER — Telehealth: Payer: Self-pay | Admitting: Internal Medicine

## 2017-06-23 MED ORDER — LEVOTHYROXINE SODIUM 100 MCG PO TABS: 100.0000 ug | ORAL_TABLET | Freq: Every day | ORAL | 0 refills | Status: DC

## 2017-06-23 NOTE — Telephone Encounter (Signed)
Please see phone note and advise. Refill request for Levothyroxine. Last OV note on 04/16/17 stated recheck TFT's in 5 weeks. Not showing recheck.

## 2017-06-23 NOTE — Telephone Encounter (Signed)
Patient will be out of country for 2 months. Pt needs RX for Levothyroxine 100 mg -90 day supply [facility] to [facility] on [address].

## 2017-06-23 NOTE — Telephone Encounter (Signed)
OK to refill. She can come for labs after she returns.

## 2017-06-24 DIAGNOSIS — R69 Illness, unspecified: Secondary | ICD-10-CM | POA: Diagnosis not present

## 2017-07-05 NOTE — Progress Notes (Signed)
HPI:  Using dictation device. Unfortunately this device frequently misinterprets words/phrases.  Follow up back pain: -acute flare intermittent chronic back pain started about 1 month ago -hx several back surgeries -saw her orthopedic clinic 2/20 and they did steroid dose pack and advised follow up in 4 weeks if not resolved -sees ortho in follow up this week and considering inj - seeing spione specialist at [facility] -mood was worse for a few weeks, saw Dr. Glennon Hamilton and doing much better -mobic helping the pain travelling to her country this month - excited about this PHQ9: 3/10   ROS: See pertinent positives and negatives per HPI.  Past Medical History:  Diagnosis Date  . ALLERGIC RHINITIS 05/27/2007   uses Flonase daily as needed and ALbuterol inhaler prn;gets Allergy shots  . Allergy   . Anxiety and depression 12/19/2009  . Arthritis   . Cancer (HCC)    vaginal  . Constipation   . Degenerative disorder of eye   . Depression   . Diverticulosis   . Environmental allergies   . Essential hypertension, benign 06/26/2009  . GERD 05/27/2007   takes Nexium daily  . Heart murmur    yrs ago  . History of gout   . History of migraine many yrs ago  . Hyperlipidemia    takes Pravastatin daily  . Hypothyroidism   . Insomnia    but doesn't take any meds  . LOW BACK PAIN 05/27/2007  . Numbness    left toes   . Osteoporosis   . Seizure disorder [facility])    reports remote, takes Tegretol daily, followed by neurology  . Seizures ([place])    last 1983, none since then- last seizure 1988 per pt     Past Surgical History:  Procedure Laterality Date  . ADENOIDECTOMY    . BACK SURGERY     x2  . COLONOSCOPY  2009  . ESOPHAGOGASTRODUODENOSCOPY    . Heel spurs Bilateral   . KNEE ARTHROPLASTY Left 11/07/2013   Procedure: COMPUTER ASSISTED TOTAL KNEE ARTHROPLASTY;  Surgeon: Marybelle Killings, MD;  Location: [place];  Service: Orthopedics;  Laterality: Left;  Left Total Knee Arthroplasty, Cemented,  Computer Assist  . TONSILLECTOMY    . TOTAL KNEE REVISION Left 09/19/2014   Procedure: LEFT TOTAL KNEE REVISION;  Surgeon: Leandrew Koyanagi, MD;  Location: [place];  Service: Orthopedics;  Laterality: Left;  . UPPER GASTROINTESTINAL ENDOSCOPY      Family History  Problem Relation Age of Onset  . Arthritis Mother   . Stroke Mother   . Heart murmur Other   . Colon cancer Neg Hx   . Esophageal cancer Neg Hx   . Stomach cancer Neg Hx   . Rectal cancer Neg Hx   . Colon polyps Neg Hx     SOCIAL HX: no reported changes   Current Outpatient Medications:  .  albuterol (PROVENTIL HFA;VENTOLIN HFA) 108 (90 BASE) MCG/ACT inhaler, Inhale 1 puff into the lungs every 6 (six) hours as needed for wheezing or shortness of breath., Disp: , Rfl:  .  aspirin 81 MG tablet, Take 81 mg by mouth daily., Disp: , Rfl:  .  beta carotene w/minerals (OCUVITE) tablet, Take 1 tablet by mouth daily., Disp: , Rfl:  .  Cyanocobalamin (VITAMIN B 12 PO), Take by mouth daily.  , Disp: , Rfl:  .  dicyclomine (BENTYL) 10 MG capsule, Take 1 capsule (10 mg total) by mouth every 8 (eight) hours as needed for spasms., Disp: 90  capsule, Rfl: 3 .  esomeprazole (NEXIUM) 20 MG capsule, Take 20 mg by mouth daily before breakfast., Disp: , Rfl:  .  fluticasone (FLONASE) 50 MCG/ACT nasal spray, Place 2 sprays into both nostrils daily., Disp: 16 g, Rfl: 6 .  levocetirizine (XYZAL) 5 MG tablet, , Disp: , Rfl:  .  levothyroxine (SYNTHROID, LEVOTHROID) 100 MCG tablet, Take 1 tablet (100 mcg total) by mouth daily., Disp: 90 tablet, Rfl: 0 .  lisinopril (PRINIVIL,ZESTRIL) 5 MG tablet, Take 1 tablet (5 mg total) by mouth daily., Disp: 90 tablet, Rfl: 1 .  meloxicam (MOBIC) 7.5 MG tablet, Take one tab po qd prn pain (do not start until finished with sterapred dosepak), Disp: 30 tablet, Rfl: 2 .  Multiple Vitamin (MULTIVITAMIN) capsule, Take 1 capsule by mouth daily.  , Disp: , Rfl:  .  Omega-3 Fatty Acids (FISH OIL PO), Take by mouth daily.  ,  Disp: , Rfl:  .  ondansetron (ZOFRAN) 4 MG tablet, Take 1 tablet (4 mg total) by mouth every 8 (eight) hours as needed for nausea or vomiting., Disp: 30 tablet, Rfl: 1 .  pravastatin (PRAVACHOL) 20 MG tablet, TAKE 1 TABLET(20 MG) BY MOUTH DAILY, Disp: 90 tablet, Rfl: 1 .  senna-docusate (SENOKOT S) 8.6-50 MG per tablet, Take 1 tablet by mouth at bedtime as needed., Disp: 30 tablet, Rfl: 1 .  sertraline (ZOLOFT) 100 MG tablet, Take 1 tablet (100 mg total) by mouth daily. (Patient taking differently: Take 125 mg by mouth daily. ), Disp: 90 tablet, Rfl: 1 .  sertraline (ZOLOFT) 25 MG tablet, Take 25 mg by mouth daily., Disp: , Rfl:  .  zonisamide (ZONEGRAN) 100 MG capsule, Take 200 mg by mouth 2 (two) times daily., Disp: , Rfl:   Current Facility-Administered Medications:  .  0.9 %  sodium chloride infusion, 500 mL, Intravenous, Continuous, Armbruster, Carlota Raspberry, MD  EXAM:  Vitals:   07/06/17 1006  BP: (!) 102/56  Pulse: 78  Temp: 98.4 F (36.9 C)    Body mass index is 33.26 kg/m.  GENERAL: vitals reviewed and listed above, alert, oriented, appears well hydrated and in no acute distress  HEENT: atraumatic, conjunttiva clear, no obvious abnormalities on inspection of external nose and ears  NECK: no obvious masses on inspection  LUNGS: clear to auscultation bilaterally, no wheezes, rales or rhonchi, good air movement  CV: HRRR, no peripheral edema  MS: moves all extremities without noticeable abnormality  PSYCH: pleasant and cooperative, no obvious depression or anxiety  ASSESSMENT AND PLAN:  Discussed the following assessment and plan:  Recurrent major depressive disorder, in partial remission (HCC)  Chronic low back pain, unspecified back pain laterality, with sciatica presence unspecified  -consider OMT - she will check with insurance and her specialist -f/u Dr. Glennon Hamilton as needed -regular exercise -seeing endo for thyroid -follow up 3-4 months -Patient advised to return  or notify a doctor immediately if symptoms worsen or persist or new concerns arise.  Patient Instructions  BEFORE YOU LEAVE: -follow up: 3-4 months  I hope you have a good trip.  Call Dr. Glennon Hamilton if any further mood issues before your trip.      Beverly Kern, DO

## 2017-07-06 ENCOUNTER — Ambulatory Visit (INDEPENDENT_AMBULATORY_CARE_PROVIDER_SITE_OTHER): Payer: Medicare HMO | Admitting: Family Medicine

## 2017-07-06 ENCOUNTER — Encounter: Payer: Self-pay | Admitting: Family Medicine

## 2017-07-06 VITALS — BP 102/56 | HR 78 | Temp 98.4°F | Ht 65.75 in | Wt 204.5 lb

## 2017-07-06 DIAGNOSIS — M545 Low back pain: Secondary | ICD-10-CM

## 2017-07-06 DIAGNOSIS — F3341 Major depressive disorder, recurrent, in partial remission: Secondary | ICD-10-CM | POA: Diagnosis not present

## 2017-07-06 DIAGNOSIS — R69 Illness, unspecified: Secondary | ICD-10-CM | POA: Diagnosis not present

## 2017-07-06 DIAGNOSIS — J3081 Allergic rhinitis due to animal (cat) (dog) hair and dander: Secondary | ICD-10-CM | POA: Diagnosis not present

## 2017-07-06 DIAGNOSIS — J3089 Other allergic rhinitis: Secondary | ICD-10-CM | POA: Diagnosis not present

## 2017-07-06 DIAGNOSIS — G8929 Other chronic pain: Secondary | ICD-10-CM

## 2017-07-06 DIAGNOSIS — J301 Allergic rhinitis due to pollen: Secondary | ICD-10-CM | POA: Diagnosis not present

## 2017-07-06 NOTE — Patient Instructions (Signed)
BEFORE YOU LEAVE: -follow up: 3-4 months  I hope you have a good trip.  Call Dr. Glennon Hamilton if any further mood issues before your trip.

## 2017-07-10 DIAGNOSIS — J3081 Allergic rhinitis due to animal (cat) (dog) hair and dander: Secondary | ICD-10-CM | POA: Diagnosis not present

## 2017-07-10 DIAGNOSIS — J3089 Other allergic rhinitis: Secondary | ICD-10-CM | POA: Diagnosis not present

## 2017-07-10 DIAGNOSIS — J301 Allergic rhinitis due to pollen: Secondary | ICD-10-CM | POA: Diagnosis not present

## 2017-07-10 DIAGNOSIS — M545 Low back pain: Secondary | ICD-10-CM | POA: Diagnosis not present

## 2017-07-10 DIAGNOSIS — M542 Cervicalgia: Secondary | ICD-10-CM | POA: Diagnosis not present

## 2017-07-15 DIAGNOSIS — M4326 Fusion of spine, lumbar region: Secondary | ICD-10-CM | POA: Diagnosis not present

## 2017-07-15 DIAGNOSIS — M545 Low back pain: Secondary | ICD-10-CM | POA: Diagnosis not present

## 2017-07-15 DIAGNOSIS — M542 Cervicalgia: Secondary | ICD-10-CM | POA: Diagnosis not present

## 2017-07-17 DIAGNOSIS — M545 Low back pain: Secondary | ICD-10-CM | POA: Diagnosis not present

## 2017-07-17 DIAGNOSIS — J301 Allergic rhinitis due to pollen: Secondary | ICD-10-CM | POA: Diagnosis not present

## 2017-07-17 DIAGNOSIS — J3089 Other allergic rhinitis: Secondary | ICD-10-CM | POA: Diagnosis not present

## 2017-07-17 DIAGNOSIS — J3081 Allergic rhinitis due to animal (cat) (dog) hair and dander: Secondary | ICD-10-CM | POA: Diagnosis not present

## 2017-08-17 ENCOUNTER — Ambulatory Visit: Payer: Self-pay | Admitting: Internal Medicine

## 2017-08-31 ENCOUNTER — Other Ambulatory Visit (INDEPENDENT_AMBULATORY_CARE_PROVIDER_SITE_OTHER): Payer: Self-pay | Admitting: Physician Assistant

## 2017-09-15 ENCOUNTER — Ambulatory Visit (INDEPENDENT_AMBULATORY_CARE_PROVIDER_SITE_OTHER)
Admission: RE | Admit: 2017-09-15 | Discharge: 2017-09-15 | Disposition: A | Discharge status: Home / Self Care | Payer: Medicare HMO | Source: Ambulatory Visit | Attending: Family Medicine | Admitting: Family Medicine

## 2017-09-15 ENCOUNTER — Other Ambulatory Visit: Payer: Self-pay | Admitting: *Deleted

## 2017-09-15 ENCOUNTER — Encounter: Payer: Self-pay | Admitting: Family Medicine

## 2017-09-15 ENCOUNTER — Other Ambulatory Visit: Payer: Self-pay

## 2017-09-15 ENCOUNTER — Ambulatory Visit (INDEPENDENT_AMBULATORY_CARE_PROVIDER_SITE_OTHER): Payer: Medicare HMO | Admitting: Family Medicine

## 2017-09-15 VITALS — BP 120/80 | HR 77 | Temp 98.4°F | Ht 65.75 in

## 2017-09-15 DIAGNOSIS — J189 Pneumonia, unspecified organism: Secondary | ICD-10-CM

## 2017-09-15 DIAGNOSIS — R05 Cough: Secondary | ICD-10-CM

## 2017-09-15 DIAGNOSIS — R059 Cough, unspecified: Secondary | ICD-10-CM

## 2017-09-15 DIAGNOSIS — R062 Wheezing: Secondary | ICD-10-CM

## 2017-09-15 DIAGNOSIS — J310 Chronic rhinitis: Secondary | ICD-10-CM

## 2017-09-15 DIAGNOSIS — J329 Chronic sinusitis, unspecified: Secondary | ICD-10-CM | POA: Diagnosis not present

## 2017-09-15 MED ORDER — AMOXICILLIN-POT CLAVULANATE 875-125 MG PO TABS: 1.0000 | ORAL_TABLET | Freq: Two times a day (BID) | ORAL | 0 refills | Status: DC

## 2017-09-15 MED ORDER — ALBUTEROL SULFATE HFA 108 (90 BASE) MCG/ACT IN AERS: 2.0000 | INHALATION_SPRAY | Freq: Four times a day (QID) | RESPIRATORY_TRACT | 0 refills | Status: DC | PRN

## 2017-09-15 NOTE — Progress Notes (Signed)
HPI:  Using dictation device. Unfortunately this device frequently misinterprets words/phrases.   Acute visit for respiratory illness: -started:4 weeks ago - round family members all sick with a cold -symptoms:nasal congestion - clear, pnd, cough, occ wheezing, occ sob -denies:fever, NVD, tooth pain, sinus pain -has tried: flonase -sick contacts/travel/risks: no reported flu, strep or tick exposure -Hx of: allergies, albuterol use with prolonged cough with resp illnes sin the past  ROS: See pertinent positives and negatives per HPI.  Past Medical History:  Diagnosis Date  . ALLERGIC RHINITIS 05/27/2007   uses Flonase daily as needed and ALbuterol inhaler prn;gets Allergy shots  . Allergy   . Anxiety and depression 12/19/2009  . Arthritis   . Cancer (HCC)    vaginal  . Constipation   . Degenerative disorder of eye   . Depression   . Diverticulosis   . Environmental allergies   . Essential hypertension, benign 06/26/2009  . GERD 05/27/2007   takes Nexium daily  . Heart murmur    yrs ago  . History of gout   . History of migraine many yrs ago  . Hyperlipidemia    takes Pravastatin daily  . Hypothyroidism   . Insomnia    but doesn't take any meds  . LOW BACK PAIN 05/27/2007  . Numbness    left toes   . Osteoporosis   . Seizure disorder [facility])    reports remote, takes Tegretol daily, followed by neurology  . Seizures ([place])    last 1983, none since then- last seizure 1988 per pt     Past Surgical History:  Procedure Laterality Date  . ADENOIDECTOMY    . BACK SURGERY     x2  . COLONOSCOPY  2009  . ESOPHAGOGASTRODUODENOSCOPY    . Heel spurs Bilateral   . KNEE ARTHROPLASTY Left 11/07/2013   Procedure: COMPUTER ASSISTED TOTAL KNEE ARTHROPLASTY;  Surgeon: Marybelle Killings, MD;  Location: [facility];  Service: Orthopedics;  Laterality: Left;  Left Total Knee Arthroplasty, Cemented, Computer Assist  . TONSILLECTOMY    . TOTAL KNEE REVISION Left 09/19/2014   Procedure: LEFT TOTAL KNEE  REVISION;  Surgeon: Leandrew Koyanagi, MD;  Location: [place];  Service: Orthopedics;  Laterality: Left;  . UPPER GASTROINTESTINAL ENDOSCOPY      Family History  Problem Relation Age of Onset  . Arthritis Mother   . Stroke Mother   . Heart murmur Other   . Colon cancer Neg Hx   . Esophageal cancer Neg Hx   . Stomach cancer Neg Hx   . Rectal cancer Neg Hx   . Colon polyps Neg Hx     Social History   Socioeconomic History  . Marital status: Married    Spouse name: Not on file  . Number of children: 1  . Years of education: Not on file  . Highest education level: Not on file  Occupational History  . Occupation: Retired  Scientific laboratory technician  . Financial resource strain: Not on file  . Food insecurity:    Worry: Not on file    Inability: Not on file  . Transportation needs:    Medical: Not on file    Non-medical: Not on file  Tobacco Use  . Smoking status: Former Smoker    Packs/day: 0.25    Years: 20.00    Pack years: 5.00    Types: Cigarettes    Last attempt to quit: 09/08/1978    Years since quitting: 39.0  . Smokeless tobacco: Never Used  .  Tobacco comment: quit smokikng 59yrs ago  Substance and Sexual Activity  . Alcohol use: No  . Drug use: No  . Sexual activity: Never    Birth control/protection: Post-menopausal  Lifestyle  . Physical activity:    Days per week: Not on file    Minutes per session: Not on file  . Stress: Not on file  Relationships  . Social connections:    Talks on phone: Not on file    Gets together: Not on file    Attends religious service: Not on file    Active member of club or organization: Not on file    Attends meetings of clubs or organizations: Not on file    Relationship status: Not on file  Other Topics Concern  . Not on file  Social History Narrative  . Not on file     Current Outpatient Medications:  .  aspirin 81 MG tablet, Take 81 mg by mouth daily., Disp: , Rfl:  .  beta carotene w/minerals (OCUVITE) tablet, Take 1 tablet by  mouth daily., Disp: , Rfl:  .  Cyanocobalamin (VITAMIN B 12 PO), Take by mouth daily.  , Disp: , Rfl:  .  dicyclomine (BENTYL) 10 MG capsule, Take 1 capsule (10 mg total) by mouth every 8 (eight) hours as needed for spasms., Disp: 90 capsule, Rfl: 3 .  esomeprazole (NEXIUM) 20 MG capsule, Take 20 mg by mouth daily before breakfast., Disp: , Rfl:  .  fluticasone (FLONASE) 50 MCG/ACT nasal spray, Place 2 sprays into both nostrils daily., Disp: 16 g, Rfl: 6 .  levocetirizine (XYZAL) 5 MG tablet, , Disp: , Rfl:  .  levothyroxine (SYNTHROID, LEVOTHROID) 100 MCG tablet, Take 1 tablet (100 mcg total) by mouth daily., Disp: 90 tablet, Rfl: 0 .  lisinopril (PRINIVIL,ZESTRIL) 5 MG tablet, Take 1 tablet (5 mg total) by mouth daily., Disp: 90 tablet, Rfl: 1 .  meloxicam (MOBIC) 7.5 MG tablet, PLEASE SEE ATTACHED FOR DETAILED DIRECTIONS, Disp: 30 tablet, Rfl: 2 .  Multiple Vitamin (MULTIVITAMIN) capsule, Take 1 capsule by mouth daily.  , Disp: , Rfl:  .  Omega-3 Fatty Acids (FISH OIL PO), Take by mouth daily.  , Disp: , Rfl:  .  ondansetron (ZOFRAN) 4 MG tablet, Take 1 tablet (4 mg total) by mouth every 8 (eight) hours as needed for nausea or vomiting., Disp: 30 tablet, Rfl: 1 .  pravastatin (PRAVACHOL) 20 MG tablet, TAKE 1 TABLET(20 MG) BY MOUTH DAILY, Disp: 90 tablet, Rfl: 1 .  senna-docusate (SENOKOT S) 8.6-50 MG per tablet, Take 1 tablet by mouth at bedtime as needed., Disp: 30 tablet, Rfl: 1 .  sertraline (ZOLOFT) 100 MG tablet, Take 1 tablet (100 mg total) by mouth daily. (Patient taking differently: Take 125 mg by mouth daily. ), Disp: 90 tablet, Rfl: 1 .  sertraline (ZOLOFT) 25 MG tablet, Take 25 mg by mouth daily., Disp: , Rfl:  .  zonisamide (ZONEGRAN) 100 MG capsule, Take 200 mg by mouth 2 (two) times daily., Disp: , Rfl:  .  albuterol (PROVENTIL HFA;VENTOLIN HFA) 108 (90 Base) MCG/ACT inhaler, Inhale 2 puffs into the lungs every 6 (six) hours as needed., Disp: 1 Inhaler, Rfl: 0 .   amoxicillin-clavulanate (AUGMENTIN) 875-125 MG tablet, Take 1 tablet by mouth 2 (two) times daily., Disp: 14 tablet, Rfl: 0  Current Facility-Administered Medications:  .  0.9 %  sodium chloride infusion, 500 mL, Intravenous, Continuous, Armbruster, Carlota Raspberry, MD  EXAM:  Vitals:   09/15/17 1132  BP:  120/80  Pulse: 77  Temp: 98.4 F (36.9 C)  SpO2: 97%    Body mass index is 33.26 kg/m.  GENERAL: vitals reviewed and listed above, alert, oriented, appears well hydrated and in no acute distress  HEENT: atraumatic, conjunttiva clear, no obvious abnormalities on inspection of external nose and ears, normal appearance of ear canals and TMs, clear nasal congestion, mild post oropharyngeal erythema with PND, no tonsillar edema or exudate, no sinus TTP  NECK: no obvious masses on inspection  LUNGS: clear to auscultation bilaterally, no wheezes, rales or rhonchi, good air movement CV: HRRR, no peripheral edema  MS: moves all extremities without noticeable abnormality  PSYCH: pleasant and cooperative, no obvious depression or anxiety  ASSESSMENT AND PLAN:  Discussed the following assessment and plan:  Rhinosinusitis  -given HPI and exam findings today, a serious infection or illness is unlikely. We discussed potential etiologies, with allergic rhinitis vs VURI being most likely. CXR to r/o other. Add antihistamine, prn alb. ABX if not improving as may have developed 2ndary sinusitis.We discussed treatment side effects, likely course, antibiotic misuse, transmission, and signs of developing a serious illness. -of course, we advised to return or notify a doctor immediately if symptoms worsen or persist or new concerns arise.    Patient Instructions  BEFORE YOU LEAVE: -xray sheet -signed rx -follow up: keep follow up as scheduled  Go get the chest xray  Continue flonase 1 spray each nostril daily  Start zyrtec and take once daily for 1 month (over the counter)  Albuterol if  needed if wheezy, excessive cough, etc  Antibiotic (azithromycin) if symptoms not improving     Lucretia Kern, DO

## 2017-09-15 NOTE — Patient Instructions (Signed)
BEFORE YOU LEAVE: -xray sheet -signed rx -follow up: keep follow up as scheduled  Go get the chest xray  Continue flonase 1 spray each nostril daily  Start zyrtec and take once daily for 1 month (over the counter)  Albuterol if needed if wheezy, excessive cough, etc  Antibiotic (azithromycin) if symptoms not improving

## 2017-09-16 ENCOUNTER — Other Ambulatory Visit (INDEPENDENT_AMBULATORY_CARE_PROVIDER_SITE_OTHER): Payer: Medicare HMO

## 2017-09-16 DIAGNOSIS — E890 Postprocedural hypothyroidism: Secondary | ICD-10-CM | POA: Diagnosis not present

## 2017-09-16 LAB — T4, FREE: Free T4: 0.89 ng/dL (ref 0.60–1.60)

## 2017-09-16 LAB — TSH: TSH: 0.09 u[IU]/mL — AB (ref 0.35–4.50)

## 2017-09-17 ENCOUNTER — Other Ambulatory Visit: Payer: Self-pay | Admitting: Internal Medicine

## 2017-09-17 DIAGNOSIS — E890 Postprocedural hypothyroidism: Secondary | ICD-10-CM

## 2017-09-17 MED ORDER — LEVOTHYROXINE SODIUM 88 MCG PO TABS: 88.0000 ug | ORAL_TABLET | Freq: Every day | ORAL | 3 refills | Status: DC

## 2017-09-18 DIAGNOSIS — J3081 Allergic rhinitis due to animal (cat) (dog) hair and dander: Secondary | ICD-10-CM | POA: Diagnosis not present

## 2017-09-18 DIAGNOSIS — J3089 Other allergic rhinitis: Secondary | ICD-10-CM | POA: Diagnosis not present

## 2017-09-18 DIAGNOSIS — J301 Allergic rhinitis due to pollen: Secondary | ICD-10-CM | POA: Diagnosis not present

## 2017-09-21 ENCOUNTER — Ambulatory Visit: Payer: Medicare HMO | Admitting: Internal Medicine

## 2017-09-21 ENCOUNTER — Encounter: Payer: Self-pay | Admitting: Internal Medicine

## 2017-09-21 VITALS — BP 150/78 | HR 82 | Ht 65.75 in | Wt 206.2 lb

## 2017-09-21 DIAGNOSIS — J301 Allergic rhinitis due to pollen: Secondary | ICD-10-CM | POA: Diagnosis not present

## 2017-09-21 DIAGNOSIS — E890 Postprocedural hypothyroidism: Secondary | ICD-10-CM | POA: Diagnosis not present

## 2017-09-21 DIAGNOSIS — J3089 Other allergic rhinitis: Secondary | ICD-10-CM | POA: Diagnosis not present

## 2017-09-21 DIAGNOSIS — J3081 Allergic rhinitis due to animal (cat) (dog) hair and dander: Secondary | ICD-10-CM | POA: Diagnosis not present

## 2017-09-21 NOTE — Patient Instructions (Signed)
Please continue Levothyroxine 88 mcg daily.  Take the thyroid hormone every day, with water, at least 30 minutes before breakfast, separated by at least 4 hours from: - acid reflux medications - calcium - iron - multivitamins  Please come back for labs in 1.5 month.  Please return in 6 months.

## 2017-09-21 NOTE — Progress Notes (Signed)
Patient ID: Beverly Ballard, female   DOB: Aug 26, 1945, 72 y.o.   MRN: [id number]    HPI  Beverly Ballard is a 72 y.o.-year-old female, returning for follow-up for  uncontrolled post ablative hypothyroidism.  Last visit 5 months ago. She saw Dr. Wilson Singer before, but he retired.  She just returned from [place] where she was for 2 mo to take care of her mother >> started to have an upper respiratory infection there >> recently dx'ed with PNA. On ABx.  She still feels weak.  Reviewed and addended history: Patient was diagnosed with hypothyroidism in the 80s after RAI treatment for Graves' disease.  She was initially started on Synthroid, now on levothyroxine.  She has been on high doses of levothyroxine, even 150 mcg for several years before she started to see Dr. Maudie Mercury, who has been trying to reduce her levothyroxine dose to improve her TFTs to the normal range.  However, since TFTs were fluctuating, she was referred to endocrinology.  At last visit, she was on 100 mcg levothyroxine daily.  After we optimized how she was taking the medication (see below), her TSH became suppressed again, so we decreased her levothyroxine dose to 88 mcg daily few days ago.  She is now taking the levothyroxine: - in am - fasting - With water, changed from OJ at last visit - at least 30 min from b'fast, change from taking it with breakfast - We moved calcium, multivitamins, PPI more than 4 hours later at last visit - fish oil, Ocuvite, vitamin D3, vitamin B12 with b'fast - not on Biotin  Reviewed patient's TFTs: Lab Results  Component Value Date   TSH 0.09 (L) 09/16/2017   TSH 1.25 04/16/2017   TSH 0.18 (L) 02/06/2017   TSH 0.12 (L) 12/23/2016   TSH 0.11 (L) 11/06/2016   TSH 0.13 (L) 09/22/2016   TSH 0.23 (L) 06/19/2016   TSH 1.11 04/24/2016   TSH 0.31 (A) 12/18/2015   TSH 0.10 (A) 11/07/2015   FREET4 0.89 09/16/2017   FREET4 0.77 04/16/2017   FREET4 0.87 02/10/2017    Pt denies: - feeling nodules in neck -  hoarseness - dysphagia - choking - SOB with lying down  She has no FH of thyroid disorders. + FH of thyroid cancer (half sister by mother). No h/o radiation tx to head or neck-except RAI treatment in the 80s.  No seaweed or kelp. No recent contrast studies. No herbal supplements. No Biotin use. No recent steroids use.    Pt. also has a history of epilepsy ( last seizure 1988), HTN, HL, GERD, anxiety + depression - continues on Zoloft. She also has a history of low abdominal pain that has been extensively investigated in the past.  She is seeing Dr. Havery Moros.  ROS: Constitutional: no weight gain/no weight loss, + fatigue, no subjective hyperthermia, no subjective hypothermia, + nocturia Eyes: no blurry vision, no xerophthalmia ENT: no sore throat, + see HPI Cardiovascular: no CP/+ SOB/no palpitations/no leg swelling Respiratory: + cough/+ SOB/+ wheezing Gastrointestinal: + N/no V/no D/no C/no acid reflux Musculoskeletal: no muscle aches/+ joint aches Skin: no rashes, no hair loss Neurological: + tremors/no numbness/no tingling/no dizziness, + weakness  I reviewed pt's medications, allergies, PMH, social hx, family hx, and changes were documented in the history of present illness. Otherwise, unchanged from my initial visit note.  Past Medical History:  Diagnosis Date  . ALLERGIC RHINITIS 05/27/2007   uses Flonase daily as needed and ALbuterol inhaler prn;gets Allergy shots  .  Allergy   . Anxiety and depression 12/19/2009  . Arthritis   . Cancer (HCC)    vaginal  . Constipation   . Degenerative disorder of eye   . Depression   . Diverticulosis   . Environmental allergies   . Essential hypertension, benign 06/26/2009  . GERD 05/27/2007   takes Nexium daily  . Heart murmur    yrs ago  . History of gout   . History of migraine many yrs ago  . Hyperlipidemia    takes Pravastatin daily  . Hypothyroidism   . Insomnia    but doesn't take any meds  . LOW BACK PAIN 05/27/2007  .  Numbness    left toes   . Osteoporosis   . Seizure disorder [facility])    reports remote, takes Tegretol daily, followed by neurology  . Seizures ([place])    last 1983, none since then- last seizure 1988 per pt    Past Surgical History:  Procedure Laterality Date  . ADENOIDECTOMY    . BACK SURGERY     x2  . COLONOSCOPY  2009  . ESOPHAGOGASTRODUODENOSCOPY    . Heel spurs Bilateral   . KNEE ARTHROPLASTY Left 11/07/2013   Procedure: COMPUTER ASSISTED TOTAL KNEE ARTHROPLASTY;  Surgeon: Marybelle Killings, MD;  Location: [place];  Service: Orthopedics;  Laterality: Left;  Left Total Knee Arthroplasty, Cemented, Computer Assist  . TONSILLECTOMY    . TOTAL KNEE REVISION Left 09/19/2014   Procedure: LEFT TOTAL KNEE REVISION;  Surgeon: Leandrew Koyanagi, MD;  Location: [place];  Service: Orthopedics;  Laterality: Left;  . UPPER GASTROINTESTINAL ENDOSCOPY     Social History   Socioeconomic History  . Marital status: Married    Spouse name: Not on file  . Number of children: 1  . Years of education: Not on file  . Highest education level: Not on file  Occupational History  . Occupation: Retired  Scientific laboratory technician  . Financial resource strain: Not on file  . Food insecurity:    Worry: Not on file    Inability: Not on file  . Transportation needs:    Medical: Not on file    Non-medical: Not on file  Tobacco Use  . Smoking status: Former Smoker    Packs/day: 0.25    Years: 20.00    Pack years: 5.00    Types: Cigarettes    Last attempt to quit: 09/08/1978    Years since quitting: 39.0  . Smokeless tobacco: Never Used  . Tobacco comment: quit smokikng 66yrs ago  Substance and Sexual Activity  . Alcohol use: No  . Drug use: No  . Sexual activity: Never    Birth control/protection: Post-menopausal  Lifestyle  . Physical activity:    Days per week: Not on file    Minutes per session: Not on file  . Stress: Not on file  Relationships  . Social connections:    Talks on phone: Not on file    Gets  together: Not on file    Attends religious service: Not on file    Active member of club or organization: Not on file    Attends meetings of clubs or organizations: Not on file    Relationship status: Not on file  . Intimate partner violence:    Fear of current or ex partner: Not on file    Emotionally abused: Not on file    Physically abused: Not on file    Forced sexual activity: Not on file  Other  Topics Concern  . Not on file  Social History Narrative  . Not on file   Current Outpatient Medications on File Prior to Visit  Medication Sig Dispense Refill  . albuterol (PROVENTIL HFA;VENTOLIN HFA) 108 (90 Base) MCG/ACT inhaler Inhale 2 puffs into the lungs every 6 (six) hours as needed. 1 Inhaler 0  . amoxicillin-clavulanate (AUGMENTIN) 875-125 MG tablet Take 1 tablet by mouth 2 (two) times daily. 14 tablet 0  . aspirin 81 MG tablet Take 81 mg by mouth daily.    . beta carotene w/minerals (OCUVITE) tablet Take 1 tablet by mouth daily.    . Cyanocobalamin (VITAMIN B 12 PO) Take by mouth daily.      Marland Kitchen dicyclomine (BENTYL) 10 MG capsule Take 1 capsule (10 mg total) by mouth every 8 (eight) hours as needed for spasms. 90 capsule 3  . esomeprazole (NEXIUM) 20 MG capsule Take 20 mg by mouth daily before breakfast.    . fluticasone (FLONASE) 50 MCG/ACT nasal spray Place 2 sprays into both nostrils daily. 16 g 6  . levocetirizine (XYZAL) 5 MG tablet     . levothyroxine (SYNTHROID, LEVOTHROID) 88 MCG tablet Take 1 tablet (88 mcg total) by mouth daily. 45 tablet 3  . lisinopril (PRINIVIL,ZESTRIL) 5 MG tablet Take 1 tablet (5 mg total) by mouth daily. 90 tablet 1  . meloxicam (MOBIC) 7.5 MG tablet PLEASE SEE ATTACHED FOR DETAILED DIRECTIONS 30 tablet 2  . Multiple Vitamin (MULTIVITAMIN) capsule Take 1 capsule by mouth daily.      . Omega-3 Fatty Acids (FISH OIL PO) Take by mouth daily.      . ondansetron (ZOFRAN) 4 MG tablet Take 1 tablet (4 mg total) by mouth every 8 (eight) hours as needed for  nausea or vomiting. 30 tablet 1  . pravastatin (PRAVACHOL) 20 MG tablet TAKE 1 TABLET(20 MG) BY MOUTH DAILY 90 tablet 1  . senna-docusate (SENOKOT S) 8.6-50 MG per tablet Take 1 tablet by mouth at bedtime as needed. 30 tablet 1  . sertraline (ZOLOFT) 100 MG tablet Take 1 tablet (100 mg total) by mouth daily. (Patient taking differently: Take 125 mg by mouth daily. ) 90 tablet 1  . sertraline (ZOLOFT) 25 MG tablet Take 25 mg by mouth daily.    Marland Kitchen zonisamide (ZONEGRAN) 100 MG capsule Take 200 mg by mouth 2 (two) times daily.     Current Facility-Administered Medications on File Prior to Visit  Medication Dose Route Frequency Provider Last Rate Last Dose  . 0.9 %  sodium chloride infusion  500 mL Intravenous Continuous Armbruster, Carlota Raspberry, MD       Allergies  Allergen Reactions  . Lamotrigine Swelling    Tongue swelling  . Bupropion Other (See Comments)    not known  . Carbamazepine Other (See Comments)    Hypnatremia  . Levetiracetam     Other reaction(s): Dizziness (intolerance)  . Tramadol Other (See Comments)    not known  . Adhesive [Tape]     ?  Blister after knee surgery  . Clarithromycin     Unsure of reaction    . Doxycycline     Interfered with seizure medication  . Nickel     Had to remove knee implant with nickel and replace it  . Other     Metal and perfumes  . Estradiol Rash  . Phenytoin Sodium Extended Other (See Comments)    drowsiness   Family History  Problem Relation Age of Onset  . Arthritis Mother   .  Stroke Mother   . Heart murmur Other   . Colon cancer Neg Hx   . Esophageal cancer Neg Hx   . Stomach cancer Neg Hx   . Rectal cancer Neg Hx   . Colon polyps Neg Hx    PE: BP (!) 150/78   Pulse 82   Ht 5' 5.75" (1.67 m)   Wt 206 lb 3.2 oz (93.5 kg)   SpO2 97%   BMI 33.54 kg/m  Wt Readings from Last 3 Encounters:  09/21/17 206 lb 3.2 oz (93.5 kg)  07/06/17 204 lb 8 oz (92.8 kg)  06/04/17 202 lb 14.4 oz (92 kg)   Constitutional: overweight,  in NAD Eyes: PERRLA, EOMI, no exophthalmos ENT: moist mucous membranes, no thyromegaly, no cervical lymphadenopathy Cardiovascular: RRR, No MRG Respiratory: CTA B Gastrointestinal: abdomen soft, NT, ND, BS+ Musculoskeletal: no deformities, strength intact in all 4 Skin: moist, warm, no rashes Neurological: + tremor with outstretched hands, DTR normal in all 4  ASSESSMENT: 1.  Post ablative hypothyroidism - After RAI treatment for Graves' disease   In the 80s.  PLAN:  1. Patient with long-standing, uncontrolled, hypothyroidism, on levothyroxine therapy.  She has been on a high dose of levothyroxine in the past, with persistently suppressed TSH levels.  At last visit, she was on 100 mcg levothyroxine daily,  decreased from 112 mcg daily.  However, she was not taking this correctly, taking it with or issues, and not separated enough from breakfast.  She was also taking PPIs, calcium, multivitamins with breakfast.  I advised her to move the orange juice in the breakfast 30 minutes after levothyroxine and PPIs/calcium/multivitamins at least 4 hours after levothyroxine. - latest thyroid labs reviewed with pt >> TSH was suppressed few days ago so we decreased her levothyroxine dose - she continues on LT4 88 mcg daily - pt feels good on this dose. - we discussed about taking the thyroid hormone every day, with water, >30 minutes before breakfast, separated by >4 hours from acid reflux medications, calcium, iron, multivitamins. Pt. is now taking it correctly. - will check thyroid tests in 1.5 mo: TSH and fT4 - Return for a follow-up appointment in 6 months  Orders Placed This Encounter  Procedures  . T4, free  . TSH   - time spent with the patient: 15 min, of which >50% was spent in obtaining information about her symptoms, reviewing her previous labs, evaluations, and treatments, counseling her about her condition, and developing a plan to further investigate and tx it  Philemon Kingdom, MD  PhD [facility]

## 2017-09-23 DIAGNOSIS — J3089 Other allergic rhinitis: Secondary | ICD-10-CM | POA: Diagnosis not present

## 2017-09-23 DIAGNOSIS — J301 Allergic rhinitis due to pollen: Secondary | ICD-10-CM | POA: Diagnosis not present

## 2017-09-23 DIAGNOSIS — J3081 Allergic rhinitis due to animal (cat) (dog) hair and dander: Secondary | ICD-10-CM | POA: Diagnosis not present

## 2017-09-28 ENCOUNTER — Ambulatory Visit: Payer: Medicare HMO | Admitting: Psychology

## 2017-09-28 DIAGNOSIS — J3081 Allergic rhinitis due to animal (cat) (dog) hair and dander: Secondary | ICD-10-CM | POA: Diagnosis not present

## 2017-09-28 DIAGNOSIS — F411 Generalized anxiety disorder: Secondary | ICD-10-CM | POA: Diagnosis not present

## 2017-09-28 DIAGNOSIS — R69 Illness, unspecified: Secondary | ICD-10-CM | POA: Diagnosis not present

## 2017-09-28 DIAGNOSIS — J301 Allergic rhinitis due to pollen: Secondary | ICD-10-CM | POA: Diagnosis not present

## 2017-09-28 DIAGNOSIS — J3089 Other allergic rhinitis: Secondary | ICD-10-CM | POA: Diagnosis not present

## 2017-09-28 DIAGNOSIS — F330 Major depressive disorder, recurrent, mild: Secondary | ICD-10-CM | POA: Diagnosis not present

## 2017-09-30 DIAGNOSIS — J301 Allergic rhinitis due to pollen: Secondary | ICD-10-CM | POA: Diagnosis not present

## 2017-09-30 DIAGNOSIS — J3081 Allergic rhinitis due to animal (cat) (dog) hair and dander: Secondary | ICD-10-CM | POA: Diagnosis not present

## 2017-09-30 DIAGNOSIS — J3089 Other allergic rhinitis: Secondary | ICD-10-CM | POA: Diagnosis not present

## 2017-10-01 DIAGNOSIS — M542 Cervicalgia: Secondary | ICD-10-CM | POA: Diagnosis not present

## 2017-10-02 DIAGNOSIS — J3089 Other allergic rhinitis: Secondary | ICD-10-CM | POA: Diagnosis not present

## 2017-10-02 DIAGNOSIS — J301 Allergic rhinitis due to pollen: Secondary | ICD-10-CM | POA: Diagnosis not present

## 2017-10-02 DIAGNOSIS — J3081 Allergic rhinitis due to animal (cat) (dog) hair and dander: Secondary | ICD-10-CM | POA: Diagnosis not present

## 2017-10-06 DIAGNOSIS — M545 Low back pain: Secondary | ICD-10-CM | POA: Diagnosis not present

## 2017-10-13 DIAGNOSIS — M25511 Pain in right shoulder: Secondary | ICD-10-CM | POA: Diagnosis not present

## 2017-10-14 DIAGNOSIS — J3081 Allergic rhinitis due to animal (cat) (dog) hair and dander: Secondary | ICD-10-CM | POA: Diagnosis not present

## 2017-10-14 DIAGNOSIS — J301 Allergic rhinitis due to pollen: Secondary | ICD-10-CM | POA: Diagnosis not present

## 2017-10-14 DIAGNOSIS — J3089 Other allergic rhinitis: Secondary | ICD-10-CM | POA: Diagnosis not present

## 2017-10-16 ENCOUNTER — Ambulatory Visit (INDEPENDENT_AMBULATORY_CARE_PROVIDER_SITE_OTHER)
Admission: RE | Admit: 2017-10-16 | Discharge: 2017-10-16 | Disposition: A | Discharge status: Home / Self Care | Payer: Medicare HMO | Source: Ambulatory Visit | Attending: Family Medicine | Admitting: Family Medicine

## 2017-10-16 DIAGNOSIS — J189 Pneumonia, unspecified organism: Secondary | ICD-10-CM | POA: Diagnosis not present

## 2017-10-16 DIAGNOSIS — J301 Allergic rhinitis due to pollen: Secondary | ICD-10-CM | POA: Diagnosis not present

## 2017-10-16 DIAGNOSIS — J3089 Other allergic rhinitis: Secondary | ICD-10-CM | POA: Diagnosis not present

## 2017-10-16 DIAGNOSIS — Z8701 Personal history of pneumonia (recurrent): Secondary | ICD-10-CM | POA: Diagnosis not present

## 2017-10-16 DIAGNOSIS — J3081 Allergic rhinitis due to animal (cat) (dog) hair and dander: Secondary | ICD-10-CM | POA: Diagnosis not present

## 2017-10-19 DIAGNOSIS — J3089 Other allergic rhinitis: Secondary | ICD-10-CM | POA: Diagnosis not present

## 2017-10-19 DIAGNOSIS — J301 Allergic rhinitis due to pollen: Secondary | ICD-10-CM | POA: Diagnosis not present

## 2017-10-19 DIAGNOSIS — J3081 Allergic rhinitis due to animal (cat) (dog) hair and dander: Secondary | ICD-10-CM | POA: Diagnosis not present

## 2017-10-20 ENCOUNTER — Other Ambulatory Visit: Payer: Self-pay | Admitting: *Deleted

## 2017-10-20 ENCOUNTER — Encounter: Payer: Self-pay | Admitting: Family Medicine

## 2017-10-20 DIAGNOSIS — R0600 Dyspnea, unspecified: Secondary | ICD-10-CM

## 2017-10-21 DIAGNOSIS — M67911 Unspecified disorder of synovium and tendon, right shoulder: Secondary | ICD-10-CM | POA: Diagnosis not present

## 2017-10-21 DIAGNOSIS — J3089 Other allergic rhinitis: Secondary | ICD-10-CM | POA: Diagnosis not present

## 2017-10-21 DIAGNOSIS — J3081 Allergic rhinitis due to animal (cat) (dog) hair and dander: Secondary | ICD-10-CM | POA: Diagnosis not present

## 2017-10-21 DIAGNOSIS — J301 Allergic rhinitis due to pollen: Secondary | ICD-10-CM | POA: Diagnosis not present

## 2017-10-27 DIAGNOSIS — J301 Allergic rhinitis due to pollen: Secondary | ICD-10-CM | POA: Diagnosis not present

## 2017-10-27 DIAGNOSIS — J3089 Other allergic rhinitis: Secondary | ICD-10-CM | POA: Diagnosis not present

## 2017-10-27 DIAGNOSIS — J3081 Allergic rhinitis due to animal (cat) (dog) hair and dander: Secondary | ICD-10-CM | POA: Diagnosis not present

## 2017-10-30 DIAGNOSIS — J301 Allergic rhinitis due to pollen: Secondary | ICD-10-CM | POA: Diagnosis not present

## 2017-10-30 DIAGNOSIS — J3089 Other allergic rhinitis: Secondary | ICD-10-CM | POA: Diagnosis not present

## 2017-10-30 DIAGNOSIS — J3081 Allergic rhinitis due to animal (cat) (dog) hair and dander: Secondary | ICD-10-CM | POA: Diagnosis not present

## 2017-11-02 DIAGNOSIS — J301 Allergic rhinitis due to pollen: Secondary | ICD-10-CM | POA: Diagnosis not present

## 2017-11-02 DIAGNOSIS — J3089 Other allergic rhinitis: Secondary | ICD-10-CM | POA: Diagnosis not present

## 2017-11-02 DIAGNOSIS — J3081 Allergic rhinitis due to animal (cat) (dog) hair and dander: Secondary | ICD-10-CM | POA: Diagnosis not present

## 2017-11-03 ENCOUNTER — Other Ambulatory Visit: Payer: Self-pay

## 2017-11-04 ENCOUNTER — Other Ambulatory Visit (INDEPENDENT_AMBULATORY_CARE_PROVIDER_SITE_OTHER): Payer: Medicare HMO

## 2017-11-04 DIAGNOSIS — E890 Postprocedural hypothyroidism: Secondary | ICD-10-CM | POA: Diagnosis not present

## 2017-11-04 DIAGNOSIS — J301 Allergic rhinitis due to pollen: Secondary | ICD-10-CM | POA: Diagnosis not present

## 2017-11-04 DIAGNOSIS — J3081 Allergic rhinitis due to animal (cat) (dog) hair and dander: Secondary | ICD-10-CM | POA: Diagnosis not present

## 2017-11-04 DIAGNOSIS — J3089 Other allergic rhinitis: Secondary | ICD-10-CM | POA: Diagnosis not present

## 2017-11-04 LAB — TSH: TSH: 0.58 u[IU]/mL (ref 0.35–4.50)

## 2017-11-04 LAB — T4, FREE: FREE T4: 0.77 ng/dL (ref 0.60–1.60)

## 2017-11-05 DIAGNOSIS — M47816 Spondylosis without myelopathy or radiculopathy, lumbar region: Secondary | ICD-10-CM | POA: Diagnosis not present

## 2017-11-08 NOTE — Progress Notes (Signed)
HPI:  Using dictation device. Unfortunately this device frequently misinterprets words/phrases.  Beverly Ballard is a pleasant 72 y.o. here for follow up. Chronic medical problems summarized below were reviewed for changes and stability and were updated as needed below. These issues and their treatment remain stable for the most part. Reports is doing ok. Seeing Dr. Mina Marble for low back pain and reports had multiple injections recently and a little better. Reports mood has actually been better.  She laid on her reports several times.  It is a little itchy.  She feels that is feeling now.  No other symptoms.  Reports she will be seeing pulmonology about her chronic dyspnea, but reports is unchanged.  Denies cough, fevers or hemoptysis.  Denies CP, increased swelling, fevers, depression, treatment intolerance or new symptoms. Due for bmp, cbc, hgba1c, lipids, awv  AWV 10/2016 HTN: -meds: lisinopril  Allergic rhinitis: -meds: flonase, xyzal  Hypothyroid: -meds: levothyroxine  Seizure disorder: -meds: zonisamide -sees neurology  IBS/Nausea/GERD: -sees GI -meds:senna, nexium, bentyl prn zofran  MDD: -longstanding depressed mood -usually improves when she visits her family in [place], then worsens when home [place] the states -sees Dr. Glennon Hamilton, psychologist -meds: zoloft  Chronic dyspnea: -hx pneumonia, not improved after treatment -persistent coursening of interstitial marking on xray per radiology report -referred to pulm for evaluation 09/2017  Crhonic back pain: -sees Dr. Mina Marble, has had surgery and injections -sees ortho for shoulder pain, R   ROS: See pertinent positives and negatives per HPI.  Past Medical History:  Diagnosis Date  . ALLERGIC RHINITIS 05/27/2007   uses Flonase daily as needed and ALbuterol inhaler prn;gets Allergy shots  . Allergy   . Anxiety and depression 12/19/2009  . Arthritis   . Cancer (HCC)    vaginal  . Constipation   . Degenerative disorder of eye   .  Depression   . Diverticulosis   . Environmental allergies   . Essential hypertension, benign 06/26/2009  . GERD 05/27/2007   takes Nexium daily  . Heart murmur    yrs ago  . History of gout   . History of migraine many yrs ago  . Hyperlipidemia    takes Pravastatin daily  . Hypothyroidism   . Insomnia    but doesn't take any meds  . LOW BACK PAIN 05/27/2007  . Numbness    left toes   . Osteoporosis   . Seizure disorder [facility])    reports remote, takes Tegretol daily, followed by neurology  . Seizures ([place])    last 1983, none since then- last seizure 1988 per pt     Past Surgical History:  Procedure Laterality Date  . ADENOIDECTOMY    . BACK SURGERY     x2  . COLONOSCOPY  2009  . ESOPHAGOGASTRODUODENOSCOPY    . Heel spurs Bilateral   . KNEE ARTHROPLASTY Left 11/07/2013   Procedure: COMPUTER ASSISTED TOTAL KNEE ARTHROPLASTY;  Surgeon: Marybelle Killings, MD;  Location: [place];  Service: Orthopedics;  Laterality: Left;  Left Total Knee Arthroplasty, Cemented, Computer Assist  . TONSILLECTOMY    . TOTAL KNEE REVISION Left 09/19/2014   Procedure: LEFT TOTAL KNEE REVISION;  Surgeon: Leandrew Koyanagi, MD;  Location: [place];  Service: Orthopedics;  Laterality: Left;  . UPPER GASTROINTESTINAL ENDOSCOPY      Family History  Problem Relation Age of Onset  . Arthritis Mother   . Stroke Mother   . Heart murmur Other   . Colon cancer Neg Hx   . Esophageal  cancer Neg Hx   . Stomach cancer Neg Hx   . Rectal cancer Neg Hx   . Colon polyps Neg Hx     SOCIAL HX: See HPI   Current Outpatient Medications:  .  aspirin 81 MG tablet, Take 81 mg by mouth daily., Disp: , Rfl:  .  beta carotene w/minerals (OCUVITE) tablet, Take 1 tablet by mouth daily., Disp: , Rfl:  .  Cyanocobalamin (VITAMIN B 12 PO), Take by mouth daily.  , Disp: , Rfl:  .  dicyclomine (BENTYL) 10 MG capsule, Take 1 capsule (10 mg total) by mouth every 8 (eight) hours as needed for spasms., Disp: 90 capsule, Rfl: 3 .  esomeprazole  (NEXIUM) 20 MG capsule, Take 20 mg by mouth daily before breakfast., Disp: , Rfl:  .  fluticasone (FLONASE) 50 MCG/ACT nasal spray, Place 2 sprays into both nostrils daily., Disp: 16 g, Rfl: 6 .  levocetirizine (XYZAL) 5 MG tablet, , Disp: , Rfl:  .  levothyroxine (SYNTHROID, LEVOTHROID) 88 MCG tablet, Take 1 tablet (88 mcg total) by mouth daily., Disp: 45 tablet, Rfl: 3 .  lisinopril (PRINIVIL,ZESTRIL) 5 MG tablet, Take 1 tablet (5 mg total) by mouth daily., Disp: 90 tablet, Rfl: 1 .  meloxicam (MOBIC) 7.5 MG tablet, PLEASE SEE ATTACHED FOR DETAILED DIRECTIONS, Disp: 30 tablet, Rfl: 2 .  Multiple Vitamin (MULTIVITAMIN) capsule, Take 1 capsule by mouth daily.  , Disp: , Rfl:  .  Omega-3 Fatty Acids (FISH OIL PO), Take by mouth daily.  , Disp: , Rfl:  .  pravastatin (PRAVACHOL) 20 MG tablet, TAKE 1 TABLET(20 MG) BY MOUTH DAILY, Disp: 90 tablet, Rfl: 1 .  senna-docusate (SENOKOT S) 8.6-50 MG per tablet, Take 1 tablet by mouth at bedtime as needed., Disp: 30 tablet, Rfl: 1 .  sertraline (ZOLOFT) 100 MG tablet, Take 1 tablet (100 mg total) by mouth daily. (Patient taking differently: Take 125 mg by mouth daily. ), Disp: 90 tablet, Rfl: 1 .  sertraline (ZOLOFT) 25 MG tablet, Take 25 mg by mouth daily., Disp: , Rfl:  .  zonisamide (ZONEGRAN) 100 MG capsule, Take 200 mg by mouth 2 (two) times daily., Disp: , Rfl:   Current Facility-Administered Medications:  .  0.9 %  sodium chloride infusion, 500 mL, Intravenous, Continuous, Armbruster, Carlota Raspberry, MD  EXAM:  Vitals:   11/09/17 0942  BP: (!) 118/58  Pulse: 77  Temp: 98.9 F (37.2 C)    Body mass index is 33.99 kg/m.  GENERAL: vitals reviewed and listed above, alert, oriented, appears well hydrated and in no acute distress  HEENT: atraumatic, conjunttiva clear, no obvious abnormalities on inspection of external nose and ears  NECK: no obvious masses on inspection  LUNGS: clear to auscultation bilaterally, no wheezes, rales or rhonchi, good  air movement  CV: HRRR, no peripheral edema  MS: moves all extremities without noticeable abnormality  SKIN:   Multiple small erythematous papules that are healing on the abdomen in 2 small patches  PSYCH: pleasant and cooperative, no obvious depression or anxiety  ASSESSMENT AND PLAN:  Discussed the following assessment and plan:  Essential hypertension - Plan: Basic metabolic panel, CBC, Cholesterol, total, HDL cholesterol  Postablative hypothyroidism  Hyperglycemia - Plan: Hemoglobin A1c  Insect bite of abdominal wall, initial encounter  Recurrent major depressive disorder, in full remission (HCC)  Chronic low back pain, unspecified back pain laterality, with sciatica presence unspecified  -labs per orders -symptomatic care bug bites and did discuss possibility bed bugs, but  she reports no new lesions and no recent travel -cont care with specialist for the back pain -seeing pulmonologist for her chronic dyspnea, reports is unchanged -Patient advised to return or notify a doctor immediately if symptoms worsen or persist or new concerns arise.  Patient Instructions  BEFORE YOU LEAVE: -labs  -follow up: AWV with Dr. Maudie Mercury in 3-4 months  We have ordered labs or studies at this visit. It can take up to 1-2 weeks for results and processing. IF results require follow up or explanation, we will call you with instructions. Clinically stable results will be released to your [facility]. If you have not heard from [place] or cannot find your results in [facility] in 2 weeks please contact our office at [phone].  If you are not yet signed up for [facility], please consider signing up.    We recommend the following healthy lifestyle for LIFE: 1) Small portions. But, make sure to get regular (at least 3 per day), healthy meals and small healthy snacks if needed.  2) Eat a healthy clean diet.   TRY TO EAT: -at least 5-7 servings of low sugar, colorful, and nutrient rich vegetables per day  (not corn, potatoes or bananas.) -berries are the best choice if you wish to eat fruit (only eat small amounts if trying to reduce weight)  -lean meets (fish, white meat of chicken or [place]) -vegan proteins for some meals - beans or tofu, whole grains, nuts and seeds -Replace bad fats with good fats - good fats include: fish, nuts and seeds, canola oil, olive oil -small amounts of low fat or non fat dairy -small amounts of100 % whole grains - check the lables -drink plenty of water  AVOID: -SUGAR, sweets, anything with added sugar, corn syrup or sweeteners - must read labels as even foods advertised as "healthy" often are loaded with sugar -if you must have a sweetener, small amounts of stevia may be best -sweetened beverages and artificially sweetened beverages -simple starches (rice, bread, potatoes, pasta, chips, etc - small amounts of 100% whole grains are ok) -red meat, pork, butter -fried foods, fast food, processed food, excessive dairy, eggs and coconut.  3)Get at least 150 minutes of sweaty aerobic exercise per week.  4)Reduce stress - consider counseling, meditation and relaxation to balance other aspects of your life.         Lucretia Kern, DO

## 2017-11-09 ENCOUNTER — Encounter: Payer: Self-pay | Admitting: Family Medicine

## 2017-11-09 ENCOUNTER — Ambulatory Visit (INDEPENDENT_AMBULATORY_CARE_PROVIDER_SITE_OTHER): Payer: Medicare HMO | Admitting: Family Medicine

## 2017-11-09 VITALS — BP 118/58 | HR 77 | Temp 98.9°F | Ht 65.75 in | Wt 209.0 lb

## 2017-11-09 DIAGNOSIS — J3089 Other allergic rhinitis: Secondary | ICD-10-CM | POA: Diagnosis not present

## 2017-11-09 DIAGNOSIS — W57XXXA Bitten or stung by nonvenomous insect and other nonvenomous arthropods, initial encounter: Secondary | ICD-10-CM | POA: Diagnosis not present

## 2017-11-09 DIAGNOSIS — M545 Low back pain: Secondary | ICD-10-CM | POA: Diagnosis not present

## 2017-11-09 DIAGNOSIS — R739 Hyperglycemia, unspecified: Secondary | ICD-10-CM | POA: Diagnosis not present

## 2017-11-09 DIAGNOSIS — I10 Essential (primary) hypertension: Secondary | ICD-10-CM | POA: Diagnosis not present

## 2017-11-09 DIAGNOSIS — E890 Postprocedural hypothyroidism: Secondary | ICD-10-CM | POA: Diagnosis not present

## 2017-11-09 DIAGNOSIS — F3342 Major depressive disorder, recurrent, in full remission: Secondary | ICD-10-CM | POA: Diagnosis not present

## 2017-11-09 DIAGNOSIS — R69 Illness, unspecified: Secondary | ICD-10-CM | POA: Diagnosis not present

## 2017-11-09 DIAGNOSIS — J3081 Allergic rhinitis due to animal (cat) (dog) hair and dander: Secondary | ICD-10-CM | POA: Diagnosis not present

## 2017-11-09 DIAGNOSIS — S30861A Insect bite (nonvenomous) of abdominal wall, initial encounter: Secondary | ICD-10-CM | POA: Diagnosis not present

## 2017-11-09 DIAGNOSIS — J301 Allergic rhinitis due to pollen: Secondary | ICD-10-CM | POA: Diagnosis not present

## 2017-11-09 DIAGNOSIS — G8929 Other chronic pain: Secondary | ICD-10-CM

## 2017-11-09 LAB — BASIC METABOLIC PANEL
BUN: 12 mg/dL (ref 6–23)
CALCIUM: 8.8 mg/dL (ref 8.4–10.5)
CHLORIDE: 108 meq/L (ref 96–112)
CO2: 26 meq/L (ref 19–32)
Creatinine, Ser: 0.89 mg/dL (ref 0.40–1.20)
GFR: 66.69 mL/min (ref >60.00)
Glucose, Bld: 91 mg/dL (ref 70–99)
Potassium: 3.7 mEq/L (ref 3.5–5.1)
Sodium: 141 mEq/L (ref 135–145)

## 2017-11-09 LAB — HDL CHOLESTEROL: HDL: 54.8 mg/dL (ref >39.00)

## 2017-11-09 LAB — CBC
HCT: 40.7 % (ref 36.0–46.0)
HEMOGLOBIN: 13.6 g/dL (ref 12.0–15.0)
MCHC: 33.5 g/dL (ref 30.0–36.0)
MCV: 95.1 fl (ref 78.0–100.0)
PLATELETS: 204 10*3/uL (ref 150.0–400.0)
RBC: 4.28 Mil/uL (ref 3.87–5.11)
RDW: 13.4 % (ref 11.5–15.5)
WBC: 7.8 10*3/uL (ref 4.0–10.5)

## 2017-11-09 LAB — CHOLESTEROL, TOTAL: CHOLESTEROL: 166 mg/dL (ref 0–200)

## 2017-11-09 LAB — HEMOGLOBIN A1C: Hgb A1c MFr Bld: 5.7 % (ref 4.6–6.5)

## 2017-11-09 NOTE — Patient Instructions (Signed)
BEFORE YOU LEAVE: -labs  -follow up: AWV with Dr. Maudie Mercury in 3-4 months  We have ordered labs or studies at this visit. It can take up to 1-2 weeks for results and processing. IF results require follow up or explanation, we will call you with instructions. Clinically stable results will be released to your [facility]. If you have not heard from [place] or cannot find your results in [facility] in 2 weeks please contact our office at [phone].  If you are not yet signed up for [facility], please consider signing up.    We recommend the following healthy lifestyle for LIFE: 1) Small portions. But, make sure to get regular (at least 3 per day), healthy meals and small healthy snacks if needed.  2) Eat a healthy clean diet.   TRY TO EAT: -at least 5-7 servings of low sugar, colorful, and nutrient rich vegetables per day (not corn, potatoes or bananas.) -berries are the best choice if you wish to eat fruit (only eat small amounts if trying to reduce weight)  -lean meets (fish, white meat of chicken or [place]) -vegan proteins for some meals - beans or tofu, whole grains, nuts and seeds -Replace bad fats with good fats - good fats include: fish, nuts and seeds, canola oil, olive oil -small amounts of low fat or non fat dairy -small amounts of100 % whole grains - check the lables -drink plenty of water  AVOID: -SUGAR, sweets, anything with added sugar, corn syrup or sweeteners - must read labels as even foods advertised as "healthy" often are loaded with sugar -if you must have a sweetener, small amounts of stevia may be best -sweetened beverages and artificially sweetened beverages -simple starches (rice, bread, potatoes, pasta, chips, etc - small amounts of 100% whole grains are ok) -red meat, pork, butter -fried foods, fast food, processed food, excessive dairy, eggs and coconut.  3)Get at least 150 minutes of sweaty aerobic exercise per week.  4)Reduce stress - consider counseling, meditation and  relaxation to balance other aspects of your life.

## 2017-11-11 NOTE — Progress Notes (Signed)
Subjective:   Beverly Ballard is a 72 y.o. female who presents for Medicare Annual (Subsequent) preventive examination.  Reports health as fair Lives with a spouse She lives in [place]    Problems with her back an her shoulder Had 2 injections to shoulder   Had 4 injections in her back all with in the last 3 weeks  FACET injection to shoulder has helped but back has not  DJD in her back  Felt light headed since these injections bP is good  Went to [place] Dr. Mina Marble Dr. Tamera Punt for the shoulder     [employer] home x 2 months in [place]; April and may and had pneumonia  Goes home and helps her sisters.  Feels like she stayed to long - mother very dependent  Feels she got run down   Smoking hx 30 year hx but quit in 15   Diet Adequate at this time  Recovering from pneumonia  Exercise Did go to water aerobics  Did go but a lot of negativity at the pool Wants to start chair yoga  Given resources for the Senior center and other areas where she can start water aerobics or other    There are no preventive care reminders to display for this patient.   Educated regarding the shingrix when she is feeling better    Female:   Pap-2013       Mammo-02/20/2016  Negative. Followed by GYN  Dr. Benjie Karvonen GYN they do the mammogram  Dexa scan-04/04/2016  Osteopenic. Followed by GYN due th is year  CCS-07/09/2007 10 year recall. Appt with GI next month and was told she does not need one        Objective:     Vitals: BP 100/60   Pulse 78   Ht 5\' 5"  (1.651 m)   Wt 209 lb (94.8 kg)   SpO2 97%   BMI 34.78 kg/m   Body mass index is 34.78 kg/m.  Advanced Directives 11/12/2017 03/05/2017 11/07/2016 05/07/2015 09/08/2014 01/24/2014 11/07/2013  Does Patient Have a Medical Advance Directive? Yes Yes Yes Yes Yes Yes Patient has advance directive, copy not in chart  Type of Advance Directive - Living will;Healthcare Power of [place];Living will Healthcare Power  of Attorney Living will - [place];Living will  Does patient want to make changes to medical advance directive? - - - - No - Patient declined - -  Copy of [place] in Chart? - No - copy requested No - copy requested - No - copy requested No - copy requested -    Tobacco Social History   Tobacco Use  Smoking Status Former Smoker  . Packs/day: 0.25  . Years: 20.00  . Pack years: 5.00  . Types: Cigarettes  . Last attempt to quit: 09/08/1978  . Years since quitting: 39.2  Smokeless Tobacco Never Used  Tobacco Comment   quit smokikng 45yrs ago     Counseling given: Yes Comment: quit smokikng 2yrs ago   Clinical Intake:  Past Medical History:  Diagnosis Date  . ALLERGIC RHINITIS 05/27/2007   uses Flonase daily as needed and ALbuterol inhaler prn;gets Allergy shots  . Allergy   . Anxiety and depression 12/19/2009  . Arthritis   . Cancer (HCC)    vaginal  . Constipation   . Degenerative disorder of eye   . Depression   . Diverticulosis   . Environmental allergies   . Essential hypertension, benign 06/26/2009  .  GERD 05/27/2007   takes Nexium daily  . Heart murmur    yrs ago  . History of gout   . History of migraine many yrs ago  . Hyperlipidemia    takes Pravastatin daily  . Hypothyroidism   . Insomnia    but doesn't take any meds  . LOW BACK PAIN 05/27/2007  . Numbness    left toes   . Osteoporosis   . Seizure disorder [facility])    reports remote, takes Tegretol daily, followed by neurology  . Seizures ([place])    last 1983, none since then- last seizure 1988 per pt    Past Surgical History:  Procedure Laterality Date  . ADENOIDECTOMY    . BACK SURGERY     x2  . COLONOSCOPY  2009  . ESOPHAGOGASTRODUODENOSCOPY    . Heel spurs Bilateral   . KNEE ARTHROPLASTY Left 11/07/2013   Procedure: COMPUTER ASSISTED TOTAL KNEE ARTHROPLASTY;  Surgeon: Marybelle Killings, MD;  Location: [place];  Service: Orthopedics;  Laterality: Left;  Left Total Knee  Arthroplasty, Cemented, Computer Assist  . TONSILLECTOMY    . TOTAL KNEE REVISION Left 09/19/2014   Procedure: LEFT TOTAL KNEE REVISION;  Surgeon: Leandrew Koyanagi, MD;  Location: [place];  Service: Orthopedics;  Laterality: Left;  . UPPER GASTROINTESTINAL ENDOSCOPY     Family History  Problem Relation Age of Onset  . Arthritis Mother   . Stroke Mother   . Heart murmur Other   . Colon cancer Neg Hx   . Esophageal cancer Neg Hx   . Stomach cancer Neg Hx   . Rectal cancer Neg Hx   . Colon polyps Neg Hx    Social History   Socioeconomic History  . Marital status: Married    Spouse name: Not on file  . Number of children: 1  . Years of education: Not on file  . Highest education level: Not on file  Occupational History  . Occupation: Retired  Scientific laboratory technician  . Financial resource strain: Not on file  . Food insecurity:    Worry: Not on file    Inability: Not on file  . Transportation needs:    Medical: Not on file    Non-medical: Not on file  Tobacco Use  . Smoking status: Former Smoker    Packs/day: 0.25    Years: 20.00    Pack years: 5.00    Types: Cigarettes    Last attempt to quit: 09/08/1978    Years since quitting: 39.2  . Smokeless tobacco: Never Used  . Tobacco comment: quit smokikng 15yrs ago  Substance and Sexual Activity  . Alcohol use: No  . Drug use: No  . Sexual activity: Never    Birth control/protection: Post-menopausal  Lifestyle  . Physical activity:    Days per week: Not on file    Minutes per session: Not on file  . Stress: Not on file  Relationships  . Social connections:    Talks on phone: Not on file    Gets together: Not on file    Attends religious service: Not on file    Active member of club or organization: Not on file    Attends meetings of clubs or organizations: Not on file    Relationship status: Not on file  Other Topics Concern  . Not on file  Social History Narrative  . Not on file    Outpatient Encounter Medications as of  11/12/2017  Medication Sig  . aspirin 81 MG  tablet Take 81 mg by mouth daily.  . beta carotene w/minerals (OCUVITE) tablet Take 1 tablet by mouth daily.  . Cyanocobalamin (VITAMIN B 12 PO) Take by mouth daily.    Marland Kitchen esomeprazole (NEXIUM) 20 MG capsule Take 20 mg by mouth daily before breakfast.  . fluticasone (FLONASE) 50 MCG/ACT nasal spray Place 2 sprays into both nostrils daily.  Marland Kitchen levocetirizine (XYZAL) 5 MG tablet   . levothyroxine (SYNTHROID, LEVOTHROID) 88 MCG tablet Take 1 tablet (88 mcg total) by mouth daily.  Marland Kitchen lisinopril (PRINIVIL,ZESTRIL) 5 MG tablet Take 1 tablet (5 mg total) by mouth daily.  . Multiple Vitamin (MULTIVITAMIN) capsule Take 1 capsule by mouth daily.    . Omega-3 Fatty Acids (FISH OIL PO) Take by mouth daily.    . pravastatin (PRAVACHOL) 20 MG tablet TAKE 1 TABLET(20 MG) BY MOUTH DAILY  . senna-docusate (SENOKOT S) 8.6-50 MG per tablet Take 1 tablet by mouth at bedtime as needed.  . sertraline (ZOLOFT) 100 MG tablet Take 1 tablet (100 mg total) by mouth daily. (Patient taking differently: Take 125 mg by mouth daily. )  . sertraline (ZOLOFT) 25 MG tablet Take 25 mg by mouth daily.  Marland Kitchen zonisamide (ZONEGRAN) 100 MG capsule Take 200 mg by mouth 2 (two) times daily.  Marland Kitchen dicyclomine (BENTYL) 10 MG capsule Take 1 capsule (10 mg total) by mouth every 8 (eight) hours as needed for spasms. (Patient not taking: Reported on 11/12/2017)  . meloxicam (MOBIC) 7.5 MG tablet PLEASE SEE ATTACHED FOR DETAILED DIRECTIONS (Patient not taking: Reported on 11/12/2017)   Facility-Administered Encounter Medications as of 11/12/2017  Medication  . 0.9 %  sodium chloride infusion    Activities of Daily Living In your present state of health, do you have any difficulty performing the following activities: 11/12/2017  Hearing? N  Vision? N  Difficulty concentrating or making decisions? N  Comment just traveled to [place]   Walking or climbing stairs? Y  Dressing or bathing? N  Doing errands,  shopping? N  Preparing Food and eating ? N  Using the Toilet? N  In the past six months, have you accidently leaked urine? N  Do you have problems with loss of bowel control? N  Managing your Medications? N  Managing your Finances? N  Housekeeping or managing your Housekeeping? N  Some recent data might be hidden    Patient Care Team: Lucretia Kern, DO as PCP - General (Family Medicine) Minus Breeding, MD as Consulting Physician (Cardiology) Azucena Fallen, MD as Consulting Physician (Obstetrics and Gynecology) Kirtland Bouchard, PhD as Consulting Physician (Psychology) Donnal Moat (Psychiatry) Armbruster, Carlota Raspberry, MD as Consulting Physician (Gastroenterology)    Assessment:   This is a routine wellness examination for [place].  Exercise Activities and Dietary recommendations Current Exercise Habits: The patient does not participate in regular exercise at present, Intensity: Mild, Exercise limited by: orthopedic condition(s)  Goals    . Exercise 150 min/wk Moderate Activity     Will try chair yoga !    . Patient Stated     To feel better with back and surgery        Fall Risk Fall Risk  11/12/2017 11/07/2016 11/01/2015 04/03/2015 01/24/2014  Falls in the past year? No No No No No  Risk for fall due to : - - - - -    Depression Screen PHQ 2/9 Scores 11/12/2017 11/09/2017 07/06/2017 06/04/2017  PHQ - 2 Score 0 0 0 0  PHQ- 9 Score - 0 3  1     Cognitive Function MMSE - Mini Mental State Exam 11/12/2017 11/12/2017  Not completed: (No Data) (No Data)     Ad8 score reviewed for issues:  Issues making decisions:  Less interest in hobbies / activities:  Repeats questions, stories (family complaining):  Trouble using ordinary gadgets (microwave, computer, phone):  Forgets the month or year:   Mismanaging finances:   Remembering appts:  Daily problems with thinking and/or memory: Ad8 score is= 0      Immunization History  Administered Date(s) Administered  .  Influenza Whole 01/19/2009, 12/19/2009  . Influenza, High Dose Seasonal PF 01/08/2015, 01/03/2016, 01/01/2017  . Influenza,inj,Quad PF,6+ Mos 01/24/2014  . Influenza-Unspecified 01/06/2012  . Pneumococcal Conjugate-13 09/19/2009  . Pneumococcal Polysaccharide-23 06/20/2013  . Tdap 05/30/2014  . Varicella 09/23/2010    Screening Tests Health Maintenance  Topic Date Due  . INFLUENZA VACCINE  11/19/2017  . MAMMOGRAM  03/25/2019  . TETANUS/TDAP  05/30/2024  . COLONOSCOPY  03/06/2027  . DEXA SCAN  Completed  . Hepatitis C Screening  Completed  . PNA vac Low Risk Adult  Completed          Plan:      PCP Notes   Health Maintenance Mammo-02/20/2016  Negative. Followed by GYN  Dr. Benjie Karvonen GYN they do the mammogram  Dexa scan-04/04/2016  Osteopenic. Followed by GYN due th is year  CCS-07/09/2007 10 year recall. Appt with GI next month and was told she does not need one    Abnormal Screens  none  Referrals  none  Patient concerns; Ms. Spoon has felt light headed since the injections.  Has to grab something;  Had a panic attack in [place] Recommended she call and schedule with Dr. Maudie Mercury   Nurse Concerns; As noted Advised to rest and feel better   Next PCP apt 11/25 Call out to the patient to call back to schedule apt with Dr. Maudie Mercury for c/o of light headedness        I have personally reviewed and noted the following in the patient's chart:   . Medical and social history . Use of alcohol, tobacco or illicit drugs  . Current medications and supplements . Functional ability and status . Nutritional status . Physical activity . Advanced directives . List of other physicians . Hospitalizations, surgeries, and ER visits in previous 12 months . Vitals . Screenings to include cognitive, depression, and falls . Referrals and appointments  In addition, I have reviewed and discussed with patient certain preventive protocols, quality metrics, and best practice  recommendations. A written personalized care plan for preventive services as well as general preventive health recommendations were provided to patient.     Wynetta Fines, RN  11/12/2017

## 2017-11-12 ENCOUNTER — Ambulatory Visit (INDEPENDENT_AMBULATORY_CARE_PROVIDER_SITE_OTHER): Payer: Medicare HMO

## 2017-11-12 ENCOUNTER — Telehealth: Payer: Self-pay

## 2017-11-12 VITALS — BP 100/60 | HR 78 | Ht 65.0 in | Wt 209.0 lb

## 2017-11-12 DIAGNOSIS — J3081 Allergic rhinitis due to animal (cat) (dog) hair and dander: Secondary | ICD-10-CM | POA: Diagnosis not present

## 2017-11-12 DIAGNOSIS — J3089 Other allergic rhinitis: Secondary | ICD-10-CM | POA: Diagnosis not present

## 2017-11-12 DIAGNOSIS — Z0000 Encounter for general adult medical examination without abnormal findings: Secondary | ICD-10-CM | POA: Diagnosis not present

## 2017-11-12 DIAGNOSIS — J301 Allergic rhinitis due to pollen: Secondary | ICD-10-CM | POA: Diagnosis not present

## 2017-11-12 NOTE — Telephone Encounter (Signed)
Call rec'd back from [place] made with Dr. Maudie Mercury on 7/29 at 11:15 for lightheadedness Wynetta Fines RN

## 2017-11-12 NOTE — Telephone Encounter (Signed)
Call to request Ms. Beverly Ballard with dr. Maudie Mercury regarding her lightheadedness Apt made for Monday the 29th of July. [facility]

## 2017-11-12 NOTE — Progress Notes (Signed)
Beverly Ballard R Beverly Gregson, DO  

## 2017-11-12 NOTE — Telephone Encounter (Signed)
Call to Ms. Beverly Ballard home to schedule an apt with Dr Maudie Mercury to review her lightheadedness  LVM for call back to my direct line [phone]  Wynetta Fines RN

## 2017-11-12 NOTE — Patient Instructions (Addendum)
Beverly Ballard , Thank you for taking time to come for your Medicare Wellness Visit. I appreciate your ongoing commitment to your health goals. Please review the following plan we discussed and let me know if I can assist you in the future.   Shingrix is a vaccine for the prevention of Shingles in Adults 50 and older.  If you are on Medicare, the shingrix is covered under your Part D plan, so you will take both of the vaccines in the series at your pharmacy. Please check with your benefits regarding applicable copays or out of pocket expenses.  The Shingrix is given in 2 vaccines approx 8 weeks apart. You must receive the 2nd dose prior to 6 months from receipt of the first. Please have the pharmacist print out you Immunization  dates for our office records   Prevention of falls: Remove rugs or any tripping hazards in the home Use Non slip mats in bathtubs and showers Placing grab bars next to the toilet and or shower Placing handrails on both sides of the stair way Adding extra lighting in the home.   Personal safety issues reviewed:  1. Consider starting a community watch program per [facility] 2.  Changes batteries is smoke detector and/or carbon monoxide detector  3.  If you have firearms; keep them in a safe place 4.  Wear protection when in the sun; Always wear sunscreen or a hat; It is good to have your doctor check your skin annually or review any new areas of concern 5. Driving safety; Keep in the right lane; stay 3 car lengths behind the car in front of you on the highway; look 3 times prior to pulling out; carry your cell phone everywhere you go!    These are the goals we discussed: Goals    . Exercise 150 min/wk Moderate Activity     Will try chair yoga !    . Patient Stated     To feel better with back and surgery        This is a list of the screening recommended for you and due dates:  Health Maintenance  Topic Date Due  . Flu Shot  11/19/2017  . Mammogram   03/25/2019  . Tetanus Vaccine  05/30/2024  . Colon Cancer Screening  03/06/2027  . DEXA scan (bone density measurement)  Completed  .  Hepatitis C: One time screening is recommended by Center for Disease Control  (CDC) for  adults born from [birth year] through 1965.   Completed  . Pneumonia vaccines  Completed   Manufacturing engineer of Services Cost  A Matter of Balance Class locations vary. Call [place] on Aging for more information.  [email or web address] [phone] 8-Session program addressing the fear of falling and increasing activity levels of older adults Free to minimal cost  A.C.T. By [employer] [address], [place], [place] [address].  BetaBlues.dk [phone]  Personal training, gym, classes including Silver Sneakers* and ACTion for Aging Adults Fee-based  A.H.O.Y. (Add Health to Bent) Airs on Time [employer] 13, M-F at [place]: [employer],  [place], [address] [place],  [place], [place] [facility], [address] [facility], [place], [place], [place]  [place]  [place] Location: Sharrell Ku. [employer] [place]      [phone]  [phone]  [phone]  [phone]  [phone]  [phone]  [phone]  [phone]  [phone]  [phone]    [phone] A total-body conditioning class for adults 62 and older; designed to increase muscular strength, endurance, range of movement, flexibility, balance, agility and coordination Free  [facility] [place], [place] [address] [place]      [address]      [phone]      Pilate's class for individualsreturning to exercise after an injury, before or after surgery or for individuals with complex musculoskeletal issues; designed to improve strength, balance , flexibility      $15/class  [place] [address] [place], [place] [address] [email or web address] [place] classes for beginners to advanced [place], [place] [address] [email or web address] Echo Chair Exercises Free, ages 68 and older; Ages 67-59 fee based  Marvia Pickles, [facility] [address] [place], [place] [address] Seniorcenter_1 .Beverlee Nims [phone]  A.H.O.Y. Tai Chi Fee-based Donation based or free  [place] Class locations vary.  Call or email Angela Burke or view website for more information. Info_[email or web address] .com [email or web address] [phone] Ongoing classes at local YMCAs and gyms Fee-based  Silver Sneakers A.C.T. By [place] [employer]: [place] [phone]  [phone] Classes designed for older adults who want to improve their strength, flexibility, balance and endurance.   Silver sneakers is covered by some insurance plans and includes a fitness center membership at participating locations. Find out more by calling [phone] or visiting [email or web address] Covered by some  insurance plans  [facility] [place] [phone] A.H.O.Y., fitness room, personal training, fitness classes for injury prevention, strength, balance, flexibility, water fitness classes Ages 55+: $30 for 6 months; Ages 59-54: $77 for 6 months  Tai Chi for Everybody [facility] [address] [place], [place] [address] Taichiforeverybody_3 .Patsi Sears [phone] Tai Chi classes for beginners to advanced; geared for seniors Donation Based      [employer] ([place] Others Participate in Exercise     Loyal Gambler. Rosana Hoes, PhD, [place] [email or web address] .edu [place]     [phone]     A comprehensive fitness program for adults.  The program paris senior-level undergraduates Kinesiology students with adults who desire to learn how to exercise safely.  Includes a structural exercise class focusing on functional fitnesss     $100/semester in fall and spring; $75 in summer (no trainers)    *Silver Sneakers is covered by some Personal assistant and includes a  Radio producer at participating locations.  Find out more by calling [phone] or visiting [email or web address]  For additional health and human services resources for senior adults, please contact SeniorLine at [phone] in [place] and [place] at [phone] in all other areas.    Fall Prevention in the Home Falls can cause injuries. They can happen to people of all ages.  There are many things you can do to make your home safe and to help prevent falls. What can I do on the outside of my home?  Regularly fix the edges of walkways and driveways and fix any cracks.  Remove anything that might make you trip as you walk through a door, such as a raised step or threshold.  Trim any bushes or trees on the path to your home.  Use bright outdoor lighting.  Clear any walking paths of anything that might make someone trip, such as rocks or tools.  Regularly check to see if  handrails are loose or broken. Make sure that both sides of any steps have handrails.  Any raised decks and porches should have guardrails on the edges.  Have any leaves, snow, or ice cleared regularly.  Use sand or salt on walking paths during winter.  Clean up any spills in your garage right away. This includes oil or grease spills. What can I do in the bathroom?  Use night lights.  Install grab bars by the toilet and in the tub and shower. Do not use towel bars as grab bars.  Use non-skid mats or decals in the tub or shower.  If you need to sit down in the shower, use a plastic, non-slip stool.  Keep the floor dry. Clean up any water that spills on the floor as soon as it happens.  Remove soap buildup in the tub or shower regularly.  Attach bath mats securely with double-sided non-slip rug tape.  Do not have throw rugs and other things on the floor that can make you trip. What can I do in the bedroom?  Use night lights.  Make sure that you have a light by your bed that is easy to reach.  Do not use any sheets or blankets that are too big for your bed. They should not hang down onto the floor.  Have a firm chair that has side arms. You can use this for support while you get dressed.  Do not have throw rugs and other things on the floor that can make you trip. What can I do in the kitchen?  Clean up any spills right away.  Avoid walking on wet floors.  Keep items that you use a lot in easy-to-reach places.  If you need to reach something above you, use a strong step stool that has a grab bar.  Keep electrical cords out of the way.  Do not use floor polish or wax that makes floors slippery. If you must use wax, use non-skid floor wax.  Do not have throw rugs and other things on the floor that can make you trip. What can I do with my stairs?  Do not leave any items on the stairs.  Make sure that there are handrails on both sides of the stairs and use them. Fix  handrails that are broken or loose. Make sure that handrails are as long as the stairways.  Check any carpeting to make sure that it is firmly attached to the stairs. Fix any carpet that is loose or worn.  Avoid having throw rugs at the top or bottom of the stairs. If you do have throw rugs, attach them to the floor with carpet tape.  Make sure that you have a light switch at the top of the stairs and the bottom of the stairs. If you do not have them, ask someone to add them for you. What else can I do to  help prevent falls?  Wear shoes that: ? Do not have high heels. ? Have rubber bottoms. ? Are comfortable and fit you well. ? Are closed at the toe. Do not wear sandals.  If you use a stepladder: ? Make sure that it is fully opened. Do not climb a closed stepladder. ? Make sure that both sides of the stepladder are locked into place. ? Ask someone to hold it for you, if possible.  Clearly mark and make sure that you can see: ? Any grab bars or handrails. ? First and last steps. ? Where the edge of each step is.  Use tools that help you move around (mobility aids) if they are needed. These include: ? Canes. ? Walkers. ? Scooters. ? Crutches.  Turn on the lights when you go into a dark area. Replace any light bulbs as soon as they burn out.  Set up your furniture so you have a clear path. Avoid moving your furniture around.  If any of your floors are uneven, fix them.  If there are any pets around you, be aware of where they are.  Review your medicines with your doctor. Some medicines can make you feel dizzy. This can increase your chance of falling. Ask your doctor what other things that you can do to help prevent falls. This information is not intended to replace advice given to you by your health care provider. Make sure you discuss any questions you have with your health care provider. Document Released: 02/01/2009 Document Revised: 09/13/2015 Document Reviewed:  05/12/2014 Elsevier Interactive Patient Education  2018 [place] Maintenance, Female Adopting a healthy lifestyle and getting preventive care can go a long way to promote health and wellness. Talk with your health care provider about what schedule of regular examinations is right for you. This is a good chance for you to check in with your provider about disease prevention and staying healthy. In between checkups, there are plenty of things you can do on your own. Experts have done a lot of research about which lifestyle changes and preventive measures are most likely to keep you healthy. Ask your health care provider for more information. Weight and diet Eat a healthy diet  Be sure to include plenty of vegetables, fruits, low-fat dairy products, and lean protein.  Do not eat a lot of foods high in solid fats, added sugars, or salt.  Get regular exercise. This is one of the most important things you can do for your health. ? Most adults should exercise for at least 150 minutes each week. The exercise should increase your heart rate and make you sweat (moderate-intensity exercise). ? Most adults should also do strengthening exercises at least twice a week. This is in addition to the moderate-intensity exercise.  Maintain a healthy weight  Body mass index (BMI) is a measurement that can be used to identify possible weight problems. It estimates body fat based on height and weight. Your health care provider can help determine your BMI and help you achieve or maintain a healthy weight.  For females 77 years of age and older: ? A BMI below 18.5 is considered underweight. ? A BMI of 18.5 to 24.9 is normal. ? A BMI of 25 to 29.9 is considered overweight. ? A BMI of 30 and above is considered obese.  Watch levels of cholesterol and blood lipids  You should start having your blood tested for lipids and cholesterol at 70 years of age, then have this test  every 5 years.  You may  need to have your cholesterol levels checked more often if: ? Your lipid or cholesterol levels are high. ? You are older than 70 years of age. ? You are at high risk for heart disease.  Cancer screening Lung Cancer  Lung cancer screening is recommended for adults 57-23 years old who are at high risk for lung cancer because of a history of smoking.  A yearly low-dose CT scan of the lungs is recommended for people who: ? Currently smoke. ? Have quit within the past 15 years. ? Have at least a 30-pack-year history of smoking. A pack year is smoking an average of one pack of cigarettes a day for 1 year.  Yearly screening should continue until it has been 15 years since you quit.  Yearly screening should stop if you develop a health problem that would prevent you from having lung cancer treatment.  Breast Cancer  Practice breast self-awareness. This means understanding how your breasts normally appear and feel.  It also means doing regular breast self-exams. Let your health care provider know about any changes, no matter how small.  If you are in your 20s or 30s, you should have a clinical breast exam (CBE) by a health care provider every 1-3 years as part of a regular health exam.  If you are 73 or older, have a CBE every year. Also consider having a breast X-ray (mammogram) every year.  If you have a family history of breast cancer, talk to your health care provider about genetic screening.  If you are at high risk for breast cancer, talk to your health care provider about having an MRI and a mammogram every year.  Breast cancer gene (BRCA) assessment is recommended for women who have family members with BRCA-related cancers. BRCA-related cancers include: ? Breast. ? Ovarian. ? Tubal. ? Peritoneal cancers.  Results of the assessment will determine the need for genetic counseling and BRCA1 and BRCA2 testing.  Cervical Cancer Your health care provider may recommend that you be  screened regularly for cancer of the pelvic organs (ovaries, uterus, and vagina). This screening involves a pelvic examination, including checking for microscopic changes to the surface of your cervix (Pap test). You may be encouraged to have this screening done every 3 years, beginning at age 66.  For women ages 11-65, health care providers may recommend pelvic exams and Pap testing every 3 years, or they may recommend the Pap and pelvic exam, combined with testing for human papilloma virus (HPV), every 5 years. Some types of HPV increase your risk of cervical cancer. Testing for HPV may also be done on women of any age with unclear Pap test results.  Other health care providers may not recommend any screening for nonpregnant women who are considered low risk for pelvic cancer and who do not have symptoms. Ask your health care provider if a screening pelvic exam is right for you.  If you have had past treatment for cervical cancer or a condition that could lead to cancer, you need Pap tests and screening for cancer for at least 20 years after your treatment. If Pap tests have been discontinued, your risk factors (such as having a new sexual partner) need to be reassessed to determine if screening should resume. Some women have medical problems that increase the chance of getting cervical cancer. In these cases, your health care provider may recommend more frequent screening and Pap tests.  Colorectal Cancer  This type of  cancer can be detected and often prevented.  Routine colorectal cancer screening usually begins at 70 years of age and continues through 70 years of age.  Your health care provider may recommend screening at an earlier age if you have risk factors for colon cancer.  Your health care provider may also recommend using home test kits to check for hidden blood in the stool.  A small camera at the end of a tube can be used to examine your colon directly (sigmoidoscopy or colonoscopy).  This is done to check for the earliest forms of colorectal cancer.  Routine screening usually begins at age 75.  Direct examination of the colon should be repeated every 5-10 years through 70 years of age. However, you may need to be screened more often if early forms of precancerous polyps or small growths are found.  Skin Cancer  Check your skin from head to toe regularly.  Tell your health care provider about any new moles or changes in moles, especially if there is a change in a mole's shape or color.  Also tell your health care provider if you have a mole that is larger than the size of a pencil eraser.  Always use sunscreen. Apply sunscreen liberally and repeatedly throughout the day.  Protect yourself by wearing long sleeves, pants, a wide-brimmed hat, and sunglasses whenever you are outside.  Heart disease, diabetes, and high blood pressure  High blood pressure causes heart disease and increases the risk of stroke. High blood pressure is more likely to develop in: ? People who have blood pressure in the high end of the normal range (130-139/85-89 mm Hg). ? People who are overweight or obese. ? People who are African American.  If you are 3-38 years of age, have your blood pressure checked every 3-5 years. If you are 99 years of age or older, have your blood pressure checked every year. You should have your blood pressure measured twice-once when you are at a hospital or clinic, and once when you are not at a hospital or clinic. Record the average of the two measurements. To check your blood pressure when you are not at a hospital or clinic, you can use: ? An automated blood pressure machine at a pharmacy. ? A home blood pressure monitor.  If you are between 13 years and 61 years old, ask your health care provider if you should take aspirin to prevent strokes.  Have regular diabetes screenings. This involves taking a blood sample to check your fasting blood sugar level. ? If  you are at a normal weight and have a low risk for diabetes, have this test once every three years after 70 years of age. ? If you are overweight and have a high risk for diabetes, consider being tested at a younger age or more often. Preventing infection Hepatitis B  If you have a higher risk for hepatitis B, you should be screened for this virus. You are considered at high risk for hepatitis B if: ? You were born in a country where hepatitis B is common. Ask your health care provider which countries are considered high risk. ? Your parents were born in a high-risk country, and you have not been immunized against hepatitis B (hepatitis B vaccine). ? You have HIV or AIDS. ? You use needles to inject street drugs. ? You live with someone who has hepatitis B. ? You have had sex with someone who has hepatitis B. ? You get hemodialysis treatment. ? You  take certain medicines for conditions, including cancer, organ transplantation, and autoimmune conditions.  Hepatitis C  Blood testing is recommended for: ? Everyone born from [birth year] through 1963. ? Anyone with known risk factors for hepatitis C.  Sexually transmitted infections (STIs)  You should be screened for sexually transmitted infections (STIs) including gonorrhea and chlamydia if: ? You are sexually active and are younger than 70 years of age. ? You are older than 70 years of age and your health care provider tells you that you are at risk for this type of infection. ? Your sexual activity has changed since you were last screened and you are at an increased risk for chlamydia or gonorrhea. Ask your health care provider if you are at risk.  If you do not have HIV, but are at risk, it may be recommended that you take a prescription medicine daily to prevent HIV infection. This is called pre-exposure prophylaxis (PrEP). You are considered at risk if: ? You are sexually active and do not regularly use condoms or know the HIV status of your  partner(s). ? You take drugs by injection. ? You are sexually active with a partner who has HIV.  Talk with your health care provider about whether you are at high risk of being infected with HIV. If you choose to begin PrEP, you should first be tested for HIV. You should then be tested every 3 months for as long as you are taking PrEP. Pregnancy  If you are premenopausal and you may become pregnant, ask your health care provider about preconception counseling.  If you may become pregnant, take 400 to 800 micrograms (mcg) of folic acid every day.  If you want to prevent pregnancy, talk to your health care provider about birth control (contraception). Osteoporosis and menopause  Osteoporosis is a disease in which the bones lose minerals and strength with aging. This can result in serious bone fractures. Your risk for osteoporosis can be identified using a bone density scan.  If you are 72 years of age or older, or if you are at risk for osteoporosis and fractures, ask your health care provider if you should be screened.  Ask your health care provider whether you should take a calcium or vitamin D supplement to lower your risk for osteoporosis.  Menopause may have certain physical symptoms and risks.  Hormone replacement therapy may reduce some of these symptoms and risks. Talk to your health care provider about whether hormone replacement therapy is right for you. Follow these instructions at home:  Schedule regular health, dental, and eye exams.  Stay current with your immunizations.  Do not use any tobacco products including cigarettes, chewing tobacco, or electronic cigarettes.  If you are pregnant, do not drink alcohol.  If you are breastfeeding, limit how much and how often you drink alcohol.  Limit alcohol intake to no more than 1 drink per day for nonpregnant women. One drink equals 12 ounces of beer, 5 ounces of wine, or 1 ounces of hard liquor.  Do not use street  drugs.  Do not share needles.  Ask your health care provider for help if you need support or information about quitting drugs.  Tell your health care provider if you often feel depressed.  Tell your health care provider if you have ever been abused or do not feel safe at home. This information is not intended to replace advice given to you by your health care provider. Make sure you discuss any questions you have  with your health care provider. Document Released: 10/21/2010 Document Revised: 09/13/2015 Document Reviewed: 01/09/2015 Elsevier Interactive Patient Education  [employer].

## 2017-11-16 ENCOUNTER — Ambulatory Visit (INDEPENDENT_AMBULATORY_CARE_PROVIDER_SITE_OTHER): Payer: Medicare HMO | Admitting: Family Medicine

## 2017-11-16 ENCOUNTER — Encounter: Payer: Self-pay | Admitting: Family Medicine

## 2017-11-16 VITALS — BP 128/80 | HR 77 | Temp 97.7°F | Ht 65.0 in | Wt 209.8 lb

## 2017-11-16 DIAGNOSIS — J301 Allergic rhinitis due to pollen: Secondary | ICD-10-CM | POA: Diagnosis not present

## 2017-11-16 DIAGNOSIS — J3089 Other allergic rhinitis: Secondary | ICD-10-CM | POA: Diagnosis not present

## 2017-11-16 DIAGNOSIS — J3081 Allergic rhinitis due to animal (cat) (dog) hair and dander: Secondary | ICD-10-CM | POA: Diagnosis not present

## 2017-11-16 DIAGNOSIS — R5383 Other fatigue: Secondary | ICD-10-CM

## 2017-11-16 DIAGNOSIS — R0600 Dyspnea, unspecified: Secondary | ICD-10-CM | POA: Diagnosis not present

## 2017-11-16 DIAGNOSIS — R42 Dizziness and giddiness: Secondary | ICD-10-CM

## 2017-11-16 NOTE — Patient Instructions (Addendum)
BEFORE YOU LEAVE: -follow up: 3 months  Do this video for the vertigo on a soft carpeted area with plenty of space.  [email or web address]  -We placed a referral for you as discussed to the physical therapist as well. It usually takes about 1-2 weeks to process and schedule this referral. If you have not heard from [place] regarding this appointment in 2 weeks please contact our office.  Please see the pulmonologist as planned.  Also schedule follow up with your neurologist.  Eat a healthy low sugar diet and get regular aerobic exercise.  DO NOT drive with vertigo.  I hope you are feeling better soon! Seek care promptly if your symptoms worsen, new concerns arise or you are not improving with treatment.

## 2017-11-16 NOTE — Progress Notes (Signed)
HPI:  Using dictation device. Unfortunately this device frequently misinterprets words/phrases.  Acute visit for " not feeling well":  Vertigo: -intermittent for 3 months -brief spells triggered by certain movements, going to get out of bed -some associated nausea -thinks has had this in the past, though never treated -sees neurology for seizure d/o -denies: HAs, vision changes, weakness, numbness, weight loss  Fatigue: -chronic, worsening the last few months since returning for visiting family abroad -she always gets depressed when returns home -but she reports "I have not been depressed" -just tired, seeing pulm next week for some unresolved dyspnea after pneumonia and abd CT -she takes a lot of medications and supplements, we reviewed today -denies vomiting, diarrhea, CP, palpitations, exertional symptoms, hemoptysis, bleeding rashes, unexplained wt loss -no regular exercise -has depression, seeing counselor and on treatment, denies SI or thoughts of harm   ROS: See pertinent positives and negatives per HPI.  Past Medical History:  Diagnosis Date  . ALLERGIC RHINITIS 05/27/2007   uses Flonase daily as needed and ALbuterol inhaler prn;gets Allergy shots  . Allergy   . Anxiety and depression 12/19/2009  . Arthritis   . Cancer (HCC)    vaginal  . Constipation   . Degenerative disorder of eye   . Depression   . Diverticulosis   . Environmental allergies   . Essential hypertension, benign 06/26/2009  . GERD 05/27/2007   takes Nexium daily  . Heart murmur    yrs ago  . History of gout   . History of migraine many yrs ago  . Hyperlipidemia    takes Pravastatin daily  . Hypothyroidism   . Insomnia    but doesn't take any meds  . LOW BACK PAIN 05/27/2007  . Numbness    left toes   . Osteoporosis   . Seizure disorder [facility])    reports remote, takes Tegretol daily, followed by neurology  . Seizures ([place])    last 1983, none since then- last seizure 1988 per pt     Past  Surgical History:  Procedure Laterality Date  . ADENOIDECTOMY    . BACK SURGERY     x2  . COLONOSCOPY  2009  . ESOPHAGOGASTRODUODENOSCOPY    . Heel spurs Bilateral   . KNEE ARTHROPLASTY Left 11/07/2013   Procedure: COMPUTER ASSISTED TOTAL KNEE ARTHROPLASTY;  Surgeon: Marybelle Killings, MD;  Location: [facility];  Service: Orthopedics;  Laterality: Left;  Left Total Knee Arthroplasty, Cemented, Computer Assist  . TONSILLECTOMY    . TOTAL KNEE REVISION Left 09/19/2014   Procedure: LEFT TOTAL KNEE REVISION;  Surgeon: Leandrew Koyanagi, MD;  Location: [place];  Service: Orthopedics;  Laterality: Left;  . UPPER GASTROINTESTINAL ENDOSCOPY      Family History  Problem Relation Age of Onset  . Arthritis Mother   . Stroke Mother   . Heart murmur Other   . Colon cancer Neg Hx   . Esophageal cancer Neg Hx   . Stomach cancer Neg Hx   . Rectal cancer Neg Hx   . Colon polyps Neg Hx     SOCIAL HX: see hpi   Current Outpatient Medications:  .  aspirin 81 MG tablet, Take 81 mg by mouth daily., Disp: , Rfl:  .  beta carotene w/minerals (OCUVITE) tablet, Take 1 tablet by mouth daily., Disp: , Rfl:  .  Cyanocobalamin (VITAMIN B 12 PO), Take by mouth daily.  , Disp: , Rfl:  .  esomeprazole (NEXIUM) 20 MG capsule, Take 20 mg by  mouth daily before breakfast., Disp: , Rfl:  .  fluticasone (FLONASE) 50 MCG/ACT nasal spray, Place 2 sprays into both nostrils daily., Disp: 16 g, Rfl: 6 .  levocetirizine (XYZAL) 5 MG tablet, , Disp: , Rfl:  .  levothyroxine (SYNTHROID, LEVOTHROID) 88 MCG tablet, Take 1 tablet (88 mcg total) by mouth daily., Disp: 45 tablet, Rfl: 3 .  lisinopril (PRINIVIL,ZESTRIL) 5 MG tablet, Take 1 tablet (5 mg total) by mouth daily., Disp: 90 tablet, Rfl: 1 .  pravastatin (PRAVACHOL) 20 MG tablet, TAKE 1 TABLET(20 MG) BY MOUTH DAILY, Disp: 90 tablet, Rfl: 1 .  sertraline (ZOLOFT) 100 MG tablet, Take 1 tablet (100 mg total) by mouth daily. (Patient taking differently: Take 125 mg by mouth daily. ), Disp:  90 tablet, Rfl: 1 .  sertraline (ZOLOFT) 25 MG tablet, Take 25 mg by mouth daily., Disp: , Rfl:  .  zonisamide (ZONEGRAN) 100 MG capsule, Take 200 mg by mouth 2 (two) times daily., Disp: , Rfl:   Current Facility-Administered Medications:  .  0.9 %  sodium chloride infusion, 500 mL, Intravenous, Continuous, Armbruster, Carlota Raspberry, MD  EXAM:  Vitals:   11/16/17 1123  BP: 128/80  Pulse: 77  Temp: 97.7 F (36.5 C)  SpO2: 98%    Body mass index is 34.91 kg/m.  GENERAL: vitals reviewed and listed above, alert, oriented, appears well hydrated and in no acute distress  HEENT: atraumatic, conjunttiva clear, no obvious abnormalities on inspection of external nose and ears, normal appearance of ear canals and TMs, clear nasal congestion, mild post oropharyngeal erythema with PND, no tonsillar edema or exudate, no sinus TTP, cisual acuity grossly intact  NECK: no obvious masses on inspection  LUNGS: clear to auscultation bilaterally, no wheezes, rales or rhonchi, good air movement  CV: HRRR, no peripheral edema  MS: moves all extremities without noticeable abnormality  PSYCH/NEURO: pleasant and cooperative, no obvious depression or anxiety, CN II-XII grossly intact, finger to nose normal, speech and thought processing grossly intact, dix hallpike + to the R with rot nystamus mouth  ASSESSMENT AND PLAN:  Discussed the following assessment and plan:  Vertigo - Plan: Ambulatory referral to Physical Therapy  Fatigue, unspecified type  Dyspnea, unspecified type  --we discussed possible serious and likely etiologies, workup and treatment, treatment risks and return precautions; has BPPV, seeing pulm for the resp issues and will await this eval, depression - seeing counselor -after this discussion, Melissa opted for: -tx BPPV -see pulmonology -see neurology for follow up -cont CBT -healthy diet, exercise -cleaned up med list, stop sedating meds as able, stop excess supplements -follow  up closely in 3 months, sooner as needed -of course, we advised Karron  to return or notify a doctor immediately if symptoms worsen or persist or new concerns arise.   There are no Patient Instructions on file for this visit.  Lucretia Kern, DO

## 2017-11-18 DIAGNOSIS — M47816 Spondylosis without myelopathy or radiculopathy, lumbar region: Secondary | ICD-10-CM | POA: Diagnosis not present

## 2017-11-19 ENCOUNTER — Encounter: Payer: Self-pay | Admitting: Pulmonary Disease

## 2017-11-19 ENCOUNTER — Ambulatory Visit: Payer: Medicare HMO | Admitting: Pulmonary Disease

## 2017-11-19 VITALS — BP 140/82 | HR 91 | Ht 65.0 in | Wt 209.2 lb

## 2017-11-19 DIAGNOSIS — R0602 Shortness of breath: Secondary | ICD-10-CM

## 2017-11-19 NOTE — Patient Instructions (Signed)
Chronic shortness of breath Recent pneumonia Multifactorial contributors to shortness of breath Chronic back pain and knee pain  Obtain pulmonary function study Rule out obstructive lung disease-previous history of smoking and secondhand smoke exposure  Obtain echocardiogram Evaluate for underlying heart disease, history of hypertension  Adequate pain control Decides is able  Will see you back in 8 to 12 weeks

## 2017-11-19 NOTE — Progress Notes (Signed)
Beverly Ballard    [id number]    May 29, 1945  Primary Care Physician:Kim, Nickola Major, DO  Referring Physician: Lucretia Kern, DO [address] [place], [place] [address]  Chief complaint:   Chronic shortness of breath of about a years duration Recently treated for pneumonia-symptoms never completely resolved  HPI:  She does have a history of chronic shortness of breath Chronic back pain She is a couple of back surgeries in the past Chronic knee pain Treated for pneumonia  She feels she can walk a mile if she takes a time She gets challenged whenever she is walking up a grade or up a flight of steps  She did smoke in the past-claims never to be a heavy smoker She does have a history of allergies for which she receives allergy shots  Pets: No pets Occupation: Worked retail Exposures: No mold exposure Smoking history: Quit smoking many years ago Travel history: Visits to [place] frequently Relevant family history: No contributory family history of lung disease  Outpatient Encounter Medications as of 11/19/2017  Medication Sig  . albuterol (PROVENTIL HFA;VENTOLIN HFA) 108 (90 Base) MCG/ACT inhaler Inhale into the lungs.  Marland Kitchen aspirin 81 MG tablet Take 81 mg by mouth daily.  . beta carotene w/minerals (OCUVITE) tablet Take 1 tablet by mouth daily.  . Cyanocobalamin (VITAMIN B 12 PO) Take by mouth daily.    Marland Kitchen esomeprazole (NEXIUM) 20 MG capsule Take 20 mg by mouth daily before breakfast.  . fluticasone (FLONASE) 50 MCG/ACT nasal spray Place 2 sprays into both nostrils daily.  Marland Kitchen levocetirizine (XYZAL) 5 MG tablet   . levothyroxine (SYNTHROID, LEVOTHROID) 88 MCG tablet Take 1 tablet (88 mcg total) by mouth daily.  Marland Kitchen lisinopril (PRINIVIL,ZESTRIL) 5 MG tablet Take 1 tablet (5 mg total) by mouth daily.  . pravastatin (PRAVACHOL) 20 MG tablet TAKE 1 TABLET(20 MG) BY MOUTH DAILY  . sertraline (ZOLOFT) 100 MG tablet Take 1 tablet (100 mg total) by mouth daily. (Patient taking  differently: Take 125 mg by mouth daily. )  . sertraline (ZOLOFT) 25 MG tablet Take 25 mg by mouth daily.  Marland Kitchen zonisamide (ZONEGRAN) 100 MG capsule Take 200 mg by mouth 2 (two) times daily.   Facility-Administered Encounter Medications as of 11/19/2017  Medication  . 0.9 %  sodium chloride infusion    Allergies as of 11/19/2017 - Review Complete 11/19/2017  Allergen Reaction Noted  . Lamotrigine Swelling 07/03/2015  . Bupropion Other (See Comments) 05/02/2016  . Carbamazepine Other (See Comments) 07/03/2015  . Levetiracetam  07/03/2015  . Tramadol Other (See Comments) 05/02/2016  . Adhesive [tape]  09/08/2014  . Clarithromycin    . Doxycycline    . Nickel  08/11/2014  . Other  08/11/2014  . Estradiol Rash 07/03/2015  . Phenytoin sodium extended Other (See Comments) 07/03/2015    Past Medical History:  Diagnosis Date  . ALLERGIC RHINITIS 05/27/2007   uses Flonase daily as needed and ALbuterol inhaler prn;gets Allergy shots  . Allergy   . Anxiety and depression 12/19/2009  . Arthritis   . Cancer (HCC)    vaginal  . Constipation   . Degenerative disorder of eye   . Depression   . Diverticulosis   . Environmental allergies   . Essential hypertension, benign 06/26/2009  . GERD 05/27/2007   takes Nexium daily  . Heart murmur    yrs ago  . History of gout   . History of migraine many yrs ago  .  Hyperlipidemia    takes Pravastatin daily  . Hypothyroidism   . Insomnia    but doesn't take any meds  . LOW BACK PAIN 05/27/2007  . Numbness    left toes   . Osteoporosis   . Seizure disorder [facility])    reports remote, takes Tegretol daily, followed by neurology  . Seizures ([place])    last 1983, none since then- last seizure 1988 per pt     Past Surgical History:  Procedure Laterality Date  . ADENOIDECTOMY    . BACK SURGERY     x2  . COLONOSCOPY  2009  . ESOPHAGOGASTRODUODENOSCOPY    . Heel spurs Bilateral   . KNEE ARTHROPLASTY Left 11/07/2013   Procedure: COMPUTER ASSISTED TOTAL  KNEE ARTHROPLASTY;  Surgeon: Marybelle Killings, MD;  Location: [facility];  Service: Orthopedics;  Laterality: Left;  Left Total Knee Arthroplasty, Cemented, Computer Assist  . TONSILLECTOMY    . TOTAL KNEE REVISION Left 09/19/2014   Procedure: LEFT TOTAL KNEE REVISION;  Surgeon: Leandrew Koyanagi, MD;  Location: [facility];  Service: Orthopedics;  Laterality: Left;  . UPPER GASTROINTESTINAL ENDOSCOPY      Family History  Problem Relation Age of Onset  . Arthritis Mother   . Stroke Mother   . Heart murmur Other   . Colon cancer Neg Hx   . Esophageal cancer Neg Hx   . Stomach cancer Neg Hx   . Rectal cancer Neg Hx   . Colon polyps Neg Hx     Social History   Socioeconomic History  . Marital status: Married    Spouse name: Not on file  . Number of children: 1  . Years of education: Not on file  . Highest education level: Not on file  Occupational History  . Occupation: Retired  Scientific laboratory technician  . Financial resource strain: Not on file  . Food insecurity:    Worry: Not on file    Inability: Not on file  . Transportation needs:    Medical: Not on file    Non-medical: Not on file  Tobacco Use  . Smoking status: Former Smoker    Packs/day: 0.25    Years: 20.00    Pack years: 5.00    Types: Cigarettes    Last attempt to quit: 09/08/1978    Years since quitting: 39.2  . Smokeless tobacco: Never Used  . Tobacco comment: quit smokikng 66yrs ago  Substance and Sexual Activity  . Alcohol use: No  . Drug use: No  . Sexual activity: Never    Birth control/protection: Post-menopausal  Lifestyle  . Physical activity:    Days per week: Not on file    Minutes per session: Not on file  . Stress: Not on file  Relationships  . Social connections:    Talks on phone: Not on file    Gets together: Not on file    Attends religious service: Not on file    Active member of club or organization: Not on file    Attends meetings of clubs or organizations: Not on file    Relationship status: Not on file  .  Intimate partner violence:    Fear of current or ex partner: Not on file    Emotionally abused: Not on file    Physically abused: Not on file    Forced sexual activity: Not on file  Other Topics Concern  . Not on file  Social History Narrative  . Not on file    Review  of Systems  Constitutional: Positive for fatigue.  HENT: Negative for postnasal drip and rhinorrhea.   Eyes: Negative.   Respiratory: Positive for shortness of breath. Negative for cough.   Cardiovascular: Negative.   Gastrointestinal: Negative.   Endocrine: Negative.   Genitourinary: Negative.   Musculoskeletal: Positive for arthralgias.       Knee pain  Allergic/Immunologic: Positive for environmental allergies.  Neurological: Negative.   Hematological: Negative.   Psychiatric/Behavioral: Positive for dysphoric mood.  All other systems reviewed and are negative.   Vitals:   11/19/17 0958  BP: 140/82  Pulse: 91  SpO2: 96%     Physical Exam  Constitutional: She is oriented to person, place, and time. She appears well-developed and well-nourished. No distress.  HENT:  Head: Normocephalic and atraumatic.  Eyes: Pupils are equal, round, and reactive to light. Right eye exhibits no discharge. Left eye exhibits no discharge.  Neck: Normal range of motion. Neck supple. No tracheal deviation present. No thyromegaly present.  Cardiovascular: Normal rate and regular rhythm.  Pulmonary/Chest: Effort normal and breath sounds normal. No respiratory distress.  Abdominal: Soft. Bowel sounds are normal. She exhibits no distension. There is no guarding.  Musculoskeletal: Normal range of motion. She exhibits no edema or deformity.  Neurological: She is alert and oriented to person, place, and time. No cranial nerve deficit.  Skin: Skin is warm and dry. No rash noted. She is not diaphoretic. No erythema.  Psychiatric: Her behavior is normal. Thought content normal.     Data Reviewed: Prominent interstitial markings  on x-ray reviewed by myself-10/16/2017 Recent  CBC, Chem-7 within normal limit  Assessment:  Chronic shortness of breath-multifactorial Significant back pain and knee pain Recent pneumonia Previous smoker, significant exposure to secondhand smoke in the past-no underlying lung disease known History of allergies for which she receives allergy shots  Plan/Recommendations:  Obtain a full pulmonary function study Obtain an echocardiogram to assess for cardiac dysfunction Encouraged to stay active  Continue to use albuterol as needed  Possibility of obtaining a CT scan of the chest was discussed with the patient if above studies are negative  Multifactorial reasons for shortness of breath is likely and may be significantly impacted by her chronic back pain for which she has had surgeries in the past  The pulmonary function study will evaluate for obstructive lung disease  We will see her back in the office in 8 to 12 weeks   Sherrilyn Rist MD [facility] 11/19/2017, 10:30 AM  CC: Lucretia Kern, DO

## 2017-11-23 ENCOUNTER — Ambulatory Visit: Payer: Medicare HMO | Admitting: Psychology

## 2017-11-23 DIAGNOSIS — J3089 Other allergic rhinitis: Secondary | ICD-10-CM | POA: Diagnosis not present

## 2017-11-23 DIAGNOSIS — F330 Major depressive disorder, recurrent, mild: Secondary | ICD-10-CM | POA: Diagnosis not present

## 2017-11-23 DIAGNOSIS — F411 Generalized anxiety disorder: Secondary | ICD-10-CM

## 2017-11-23 DIAGNOSIS — J301 Allergic rhinitis due to pollen: Secondary | ICD-10-CM | POA: Diagnosis not present

## 2017-11-23 DIAGNOSIS — J3081 Allergic rhinitis due to animal (cat) (dog) hair and dander: Secondary | ICD-10-CM | POA: Diagnosis not present

## 2017-11-23 DIAGNOSIS — R69 Illness, unspecified: Secondary | ICD-10-CM | POA: Diagnosis not present

## 2017-11-24 ENCOUNTER — Other Ambulatory Visit: Payer: Self-pay

## 2017-11-24 ENCOUNTER — Ambulatory Visit (HOSPITAL_COMMUNITY): Payer: Medicare HMO | Attending: Pulmonary Disease

## 2017-11-24 DIAGNOSIS — E785 Hyperlipidemia, unspecified: Secondary | ICD-10-CM | POA: Insufficient documentation

## 2017-11-24 DIAGNOSIS — I10 Essential (primary) hypertension: Secondary | ICD-10-CM | POA: Insufficient documentation

## 2017-11-24 DIAGNOSIS — R0602 Shortness of breath: Secondary | ICD-10-CM | POA: Diagnosis not present

## 2017-11-24 DIAGNOSIS — Z6834 Body mass index (BMI) 34.0-34.9, adult: Secondary | ICD-10-CM | POA: Diagnosis not present

## 2017-11-24 DIAGNOSIS — Z87891 Personal history of nicotine dependence: Secondary | ICD-10-CM | POA: Diagnosis not present

## 2017-11-24 DIAGNOSIS — E669 Obesity, unspecified: Secondary | ICD-10-CM | POA: Insufficient documentation

## 2017-11-26 ENCOUNTER — Telehealth: Payer: Self-pay | Admitting: Pulmonary Disease

## 2017-11-26 NOTE — Telephone Encounter (Signed)
Call patient   Normal echocardiogram   Routine follow up  ------------------------------- lmtcb for pt.

## 2017-11-27 NOTE — Telephone Encounter (Signed)
Attempted to call patient today regarding results. I did not receive an answer at time of call. I have left a voicemail message for pt to return call. X2  

## 2017-11-30 ENCOUNTER — Encounter: Payer: Self-pay | Admitting: *Deleted

## 2017-11-30 DIAGNOSIS — J301 Allergic rhinitis due to pollen: Secondary | ICD-10-CM | POA: Diagnosis not present

## 2017-11-30 DIAGNOSIS — J3089 Other allergic rhinitis: Secondary | ICD-10-CM | POA: Diagnosis not present

## 2017-11-30 DIAGNOSIS — J3081 Allergic rhinitis due to animal (cat) (dog) hair and dander: Secondary | ICD-10-CM | POA: Diagnosis not present

## 2017-11-30 NOTE — Telephone Encounter (Signed)
Attempted to call pt. I did not receive an answer. I have left a message for pt to return our call.  

## 2017-11-30 NOTE — Telephone Encounter (Signed)
LMOM TCB Because we have been unable to speak with patient and she has an active MyChart account, will e-mail patient as well with results.  Have advised pt to call back or e-mail with any questions or concerns.

## 2017-11-30 NOTE — Telephone Encounter (Signed)
Pt is calling back   [phone]

## 2017-11-30 NOTE — Telephone Encounter (Signed)
Patient is calling back. CB is [phone]

## 2017-11-30 NOTE — Telephone Encounter (Signed)
[facility] 8/12

## 2017-12-01 DIAGNOSIS — M533 Sacrococcygeal disorders, not elsewhere classified: Secondary | ICD-10-CM | POA: Diagnosis not present

## 2017-12-07 ENCOUNTER — Encounter: Payer: Self-pay | Admitting: Family Medicine

## 2017-12-07 DIAGNOSIS — G40909 Epilepsy, unspecified, not intractable, without status epilepticus: Secondary | ICD-10-CM

## 2017-12-09 DIAGNOSIS — R69 Illness, unspecified: Secondary | ICD-10-CM | POA: Diagnosis not present

## 2017-12-10 ENCOUNTER — Other Ambulatory Visit: Payer: Self-pay | Admitting: Family Medicine

## 2017-12-16 DIAGNOSIS — G40209 Localization-related (focal) (partial) symptomatic epilepsy and epileptic syndromes with complex partial seizures, not intractable, without status epilepticus: Secondary | ICD-10-CM | POA: Diagnosis not present

## 2017-12-18 DIAGNOSIS — J3081 Allergic rhinitis due to animal (cat) (dog) hair and dander: Secondary | ICD-10-CM | POA: Diagnosis not present

## 2017-12-18 DIAGNOSIS — J301 Allergic rhinitis due to pollen: Secondary | ICD-10-CM | POA: Diagnosis not present

## 2017-12-18 DIAGNOSIS — J3089 Other allergic rhinitis: Secondary | ICD-10-CM | POA: Diagnosis not present

## 2017-12-22 ENCOUNTER — Ambulatory Visit: Payer: Medicare HMO | Admitting: Psychology

## 2017-12-30 DIAGNOSIS — M533 Sacrococcygeal disorders, not elsewhere classified: Secondary | ICD-10-CM | POA: Diagnosis not present

## 2018-01-01 DIAGNOSIS — J301 Allergic rhinitis due to pollen: Secondary | ICD-10-CM | POA: Diagnosis not present

## 2018-01-01 DIAGNOSIS — J3089 Other allergic rhinitis: Secondary | ICD-10-CM | POA: Diagnosis not present

## 2018-01-01 DIAGNOSIS — J3081 Allergic rhinitis due to animal (cat) (dog) hair and dander: Secondary | ICD-10-CM | POA: Diagnosis not present

## 2018-01-07 ENCOUNTER — Ambulatory Visit: Payer: Medicare HMO | Admitting: Psychology

## 2018-01-07 DIAGNOSIS — F330 Major depressive disorder, recurrent, mild: Secondary | ICD-10-CM | POA: Diagnosis not present

## 2018-01-07 DIAGNOSIS — F411 Generalized anxiety disorder: Secondary | ICD-10-CM

## 2018-01-07 DIAGNOSIS — J301 Allergic rhinitis due to pollen: Secondary | ICD-10-CM | POA: Diagnosis not present

## 2018-01-07 DIAGNOSIS — J3089 Other allergic rhinitis: Secondary | ICD-10-CM | POA: Diagnosis not present

## 2018-01-07 DIAGNOSIS — J3081 Allergic rhinitis due to animal (cat) (dog) hair and dander: Secondary | ICD-10-CM | POA: Diagnosis not present

## 2018-01-07 DIAGNOSIS — R69 Illness, unspecified: Secondary | ICD-10-CM | POA: Diagnosis not present

## 2018-01-13 DIAGNOSIS — J3089 Other allergic rhinitis: Secondary | ICD-10-CM | POA: Diagnosis not present

## 2018-01-13 DIAGNOSIS — J301 Allergic rhinitis due to pollen: Secondary | ICD-10-CM | POA: Diagnosis not present

## 2018-01-13 DIAGNOSIS — J3081 Allergic rhinitis due to animal (cat) (dog) hair and dander: Secondary | ICD-10-CM | POA: Diagnosis not present

## 2018-01-27 ENCOUNTER — Ambulatory Visit (INDEPENDENT_AMBULATORY_CARE_PROVIDER_SITE_OTHER): Payer: Medicare HMO

## 2018-01-27 ENCOUNTER — Ambulatory Visit (INDEPENDENT_AMBULATORY_CARE_PROVIDER_SITE_OTHER): Payer: Self-pay

## 2018-01-27 ENCOUNTER — Encounter (INDEPENDENT_AMBULATORY_CARE_PROVIDER_SITE_OTHER): Payer: Self-pay | Admitting: Orthopaedic Surgery

## 2018-01-27 ENCOUNTER — Ambulatory Visit (INDEPENDENT_AMBULATORY_CARE_PROVIDER_SITE_OTHER): Payer: Medicare HMO | Admitting: Orthopaedic Surgery

## 2018-01-27 DIAGNOSIS — M25561 Pain in right knee: Secondary | ICD-10-CM

## 2018-01-27 DIAGNOSIS — M25562 Pain in left knee: Secondary | ICD-10-CM | POA: Diagnosis not present

## 2018-01-27 NOTE — Progress Notes (Signed)
Office Visit Note   Patient: Beverly Ballard           Date of Birth: 1945/06/10           MRN: [id number] Visit Date: 01/27/2018              Requested by: Lucretia Kern, DO [address] [place], [place] [address] PCP: Lucretia Kern, DO   Assessment & Plan: Visit Diagnoses:  1. Left knee pain, unspecified chronicity   2. Right knee pain, unspecified chronicity     Plan: Impression is bilateral knee contusions.  There is no radiologic evidence of fracture.  She will apply ice and heat over the next several days.  She will follow-up with [place] as needed.  Call with concerns or questions.  Follow-Up Instructions: Return if symptoms worsen or fail to improve.   Orders:  Orders Placed This Encounter  Procedures  . XR KNEE 3 VIEW LEFT  . XR KNEE 3 VIEW RIGHT   No orders of the defined types were placed in this encounter.     Procedures: No procedures performed   Clinical Data: No additional findings.   Subjective: Chief Complaint  Patient presents with  . Right Knee - Injury, Pain  . Left Knee - Pain, Injury    HPI patient is a pleasant 72 year old female who presents to our clinic today following a mechanical fall landing on the anterior aspect of both knees.  This occurred on 01/25/2018.  She has had moderate pain to both knees since however this has started to improve.  Pain she has to the anterior aspect.  Worse with palpation.  She is very worried that she may have broken something as she has a history of total knee replacement on the left.  Of note, she is status post left total knee replacement July 2015 by Dr. Lorin Mercy with revision arthroplasty in May 2016 due to nickel allergy.  She has been doing okay since other than occasional stiffness from the scar tissue.  She also has underlying arthritis to the right knee.  Review of Systems as detailed in HPI.  All others reviewed and are negative.   Objective: Vital Signs: There were no vitals taken for this  visit.  Physical Exam well-developed and well-nourished female no acute distress.  Alert and oriented x3.  Ortho Exam examination of both knees reveals moderate tenderness to the patella as well as prepatellar bursa.  Full range of motion both sides.  She is stable to valgus varus stress both sides.  No joint line tenderness.  Specialty Comments:  No specialty comments available.  Imaging: Xr Knee 3 View Left  Result Date: 01/27/2018 Well-seated prosthesis.  No evidence of acute fracture.  Xr Knee 3 View Right  Result Date: 01/27/2018 No acute findings.  Moderate to severe try compartmental degenerative changes    PMFS History: Patient Active Problem List   Diagnosis Date Noted  . Right knee pain 01/27/2018  . Neck pain 06/10/2017  . Recurrent major depressive disorder, in partial remission ([place]) 05/04/2017  . Trochanteric bursitis, left hip 01/12/2017  . Left knee pain 01/12/2017  . Ovarian cyst 10/30/2016  . Aortic atherosclerosis ([place]) 11/19/2015  . Hyperlipemia 04/03/2015  . S/P revision of total knee 09/19/2014  . Seizure disorder ([place]) 01/24/2014  . Obesity, BMI unknown 01/24/2014  . Osteoarthritis of left knee 11/07/2013  . Osteopenia, stable on dexa 2010 - followed by gyn 06/15/2012  . Pap smear abnormality of cervix/human papillomavirus (  HPV) positive - 09/2011, followed by gyn 06/15/2012  . Essential hypertension 07/03/2009  . Hypothyroidism 05/27/2007  . Allergic rhinitis 05/27/2007  . GERD 05/27/2007  . Chronic low back pain 05/27/2007   Past Medical History:  Diagnosis Date  . ALLERGIC RHINITIS 05/27/2007   uses Flonase daily as needed and ALbuterol inhaler prn;gets Allergy shots  . Allergy   . Anxiety and depression 12/19/2009  . Arthritis   . Cancer (HCC)    vaginal  . Constipation   . Degenerative disorder of eye   . Depression   . Diverticulosis   . Environmental allergies   . Essential hypertension, benign 06/26/2009  . GERD 05/27/2007   takes  Nexium daily  . Heart murmur    yrs ago  . History of gout   . History of migraine many yrs ago  . Hyperlipidemia    takes Pravastatin daily  . Hypothyroidism   . Insomnia    but doesn't take any meds  . LOW BACK PAIN 05/27/2007  . Numbness    left toes   . Osteoporosis   . Seizure disorder [facility])    reports remote, takes Tegretol daily, followed by neurology  . Seizures ([place])    last 1983, none since then- last seizure 1988 per pt     Family History  Problem Relation Age of Onset  . Arthritis Mother   . Stroke Mother   . Heart murmur Other   . Colon cancer Neg Hx   . Esophageal cancer Neg Hx   . Stomach cancer Neg Hx   . Rectal cancer Neg Hx   . Colon polyps Neg Hx     Past Surgical History:  Procedure Laterality Date  . ADENOIDECTOMY    . BACK SURGERY     x2  . COLONOSCOPY  2009  . ESOPHAGOGASTRODUODENOSCOPY    . Heel spurs Bilateral   . KNEE ARTHROPLASTY Left 11/07/2013   Procedure: COMPUTER ASSISTED TOTAL KNEE ARTHROPLASTY;  Surgeon: Marybelle Killings, MD;  Location: [facility];  Service: Orthopedics;  Laterality: Left;  Left Total Knee Arthroplasty, Cemented, Computer Assist  . TONSILLECTOMY    . TOTAL KNEE REVISION Left 09/19/2014   Procedure: LEFT TOTAL KNEE REVISION;  Surgeon: Leandrew Koyanagi, MD;  Location: [facility];  Service: Orthopedics;  Laterality: Left;  . UPPER GASTROINTESTINAL ENDOSCOPY     Social History   Occupational History  . Occupation: Retired  Tobacco Use  . Smoking status: Former Smoker    Packs/day: 0.25    Years: 20.00    Pack years: 5.00    Types: Cigarettes    Last attempt to quit: 09/08/1978    Years since quitting: 39.4  . Smokeless tobacco: Never Used  . Tobacco comment: quit smokikng 70yrs ago  Substance and Sexual Activity  . Alcohol use: No  . Drug use: No  . Sexual activity: Never    Birth control/protection: Post-menopausal

## 2018-01-28 ENCOUNTER — Ambulatory Visit (INDEPENDENT_AMBULATORY_CARE_PROVIDER_SITE_OTHER): Payer: Medicare HMO

## 2018-01-28 DIAGNOSIS — J3089 Other allergic rhinitis: Secondary | ICD-10-CM | POA: Diagnosis not present

## 2018-01-28 DIAGNOSIS — Z23 Encounter for immunization: Secondary | ICD-10-CM

## 2018-01-28 DIAGNOSIS — J301 Allergic rhinitis due to pollen: Secondary | ICD-10-CM | POA: Diagnosis not present

## 2018-01-28 DIAGNOSIS — J3081 Allergic rhinitis due to animal (cat) (dog) hair and dander: Secondary | ICD-10-CM | POA: Diagnosis not present

## 2018-02-03 DIAGNOSIS — H1045 Other chronic allergic conjunctivitis: Secondary | ICD-10-CM | POA: Diagnosis not present

## 2018-02-03 DIAGNOSIS — J3081 Allergic rhinitis due to animal (cat) (dog) hair and dander: Secondary | ICD-10-CM | POA: Diagnosis not present

## 2018-02-03 DIAGNOSIS — J3089 Other allergic rhinitis: Secondary | ICD-10-CM | POA: Diagnosis not present

## 2018-02-03 DIAGNOSIS — R05 Cough: Secondary | ICD-10-CM | POA: Diagnosis not present

## 2018-02-03 DIAGNOSIS — J301 Allergic rhinitis due to pollen: Secondary | ICD-10-CM | POA: Diagnosis not present

## 2018-02-10 ENCOUNTER — Encounter: Payer: Self-pay | Admitting: Pulmonary Disease

## 2018-02-10 ENCOUNTER — Ambulatory Visit (INDEPENDENT_AMBULATORY_CARE_PROVIDER_SITE_OTHER): Payer: Medicare HMO | Admitting: Pulmonary Disease

## 2018-02-10 VITALS — BP 154/80 | HR 75 | Ht 64.0 in | Wt 217.0 lb

## 2018-02-10 DIAGNOSIS — R0602 Shortness of breath: Secondary | ICD-10-CM

## 2018-02-10 DIAGNOSIS — G4719 Other hypersomnia: Secondary | ICD-10-CM | POA: Diagnosis not present

## 2018-02-10 DIAGNOSIS — R5383 Other fatigue: Secondary | ICD-10-CM | POA: Diagnosis not present

## 2018-02-10 LAB — PULMONARY FUNCTION TEST
DL/VA % PRED: 113 %
DL/VA: 5.45 ml/min/mmHg/L
DLCO UNC % PRED: 70 %
DLCO unc: 17.04 ml/min/mmHg
FEF 25-75 POST: 3.7 L/s
FEF 25-75 PRE: 2.95 L/s
FEF2575-%Change-Post: 25 %
FEF2575-%PRED-POST: 194 %
FEF2575-%PRED-PRE: 155 %
FEV1-%Change-Post: 7 %
FEV1-%Pred-Post: 84 %
FEV1-%Pred-Pre: 78 %
FEV1-POST: 1.92 L
FEV1-Pre: 1.79 L
FEV1FVC-%Change-Post: 2 %
FEV1FVC-%PRED-PRE: 117 %
FEV6-%CHANGE-POST: 5 %
FEV6-%PRED-POST: 73 %
FEV6-%Pred-Pre: 69 %
FEV6-POST: 2.11 L
FEV6-Pre: 2 L
FEV6FVC-%Pred-Post: 104 %
FEV6FVC-%Pred-Pre: 104 %
FVC-%Change-Post: 5 %
FVC-%PRED-POST: 70 %
FVC-%Pred-Pre: 66 %
FVC-Post: 2.11 L
FVC-Pre: 2 L
PRE FEV1/FVC RATIO: 89 %
Post FEV1/FVC ratio: 91 %
Post FEV6/FVC ratio: 100 %
Pre FEV6/FVC Ratio: 100 %
RV % pred: 55 %
RV: 1.21 L
TLC % pred: 72 %
TLC: 3.69 L

## 2018-02-10 NOTE — Patient Instructions (Signed)
Still has fatigue and shortness of breath  PFT significant for restrictive pattern No significant bronchodilator response  History of snoring, recording of her with possible apneas  Regular exercise Weight loss encouraged  Will discontinue Breo  We will order a home sleep study  We will see her back in about 4 to 6 weeks

## 2018-02-10 NOTE — Progress Notes (Signed)
PFT completed today.  

## 2018-02-10 NOTE — Progress Notes (Signed)
DEAUNDRA Ballard    [id number]    [birth year]  Primary Care Physician:Kim, Nickola Major, DO  Referring Physician: Lucretia Kern, DO [address] [place], [place] [address]  Chief complaint:   Chronic shortness of breath of about a years duration Recent treatment for pneumonia Symptoms to about the same Admits to snoring, witnessed apneas   HPI:  She does have a history of chronic shortness of breath Chronic back pain She is a couple of back surgeries in the past Chronic knee pain Treated for pneumonia  Admits to snoring, husband did make a recording of her with possible witnessed apneas Her sleep is nonstructured-goes to bed between 11 and 2 AM Wakes up between 7 and 9 AM Except about 2-3 times a night  She feels she can walk a mile if she takes a time She gets challenged whenever she is walking up a grade or up a flight of steps  She did smoke in the past-claims never to be a heavy smoker She does have a history of allergies for which she receives allergy shots  Pets: No pets Occupation: Worked retail Exposures: No mold exposure Smoking history: Quit smoking many years ago Travel history: Visits to [place] frequently Relevant family history: No contributory family history of lung disease  Outpatient Encounter Medications as of 02/10/2018  Medication Sig  . albuterol (PROVENTIL HFA;VENTOLIN HFA) 108 (90 Base) MCG/ACT inhaler Inhale into the lungs.  Marland Kitchen aspirin 81 MG tablet Take 81 mg by mouth daily.  . beta carotene w/minerals (OCUVITE) tablet Take 1 tablet by mouth daily.  . Cyanocobalamin (VITAMIN B 12 PO) Take by mouth daily.    Marland Kitchen esomeprazole (NEXIUM) 20 MG capsule Take 20 mg by mouth daily before breakfast.  . fluticasone (FLONASE) 50 MCG/ACT nasal spray Place 2 sprays into both nostrils daily.  Marland Kitchen levocetirizine (XYZAL) 5 MG tablet   . levothyroxine (SYNTHROID, LEVOTHROID) 88 MCG tablet Take 1 tablet (88 mcg total) by mouth daily.  Marland Kitchen lisinopril  (PRINIVIL,ZESTRIL) 5 MG tablet TAKE 1 TABLET BY MOUTH EVERY DAY  . pravastatin (PRAVACHOL) 20 MG tablet TAKE 1 TABLET BY MOUTH EVERY DAY  . sertraline (ZOLOFT) 100 MG tablet Take 1 tablet (100 mg total) by mouth daily. (Patient taking differently: Take 125 mg by mouth daily. )  . sertraline (ZOLOFT) 25 MG tablet Take 25 mg by mouth daily.  Marland Kitchen zonisamide (ZONEGRAN) 100 MG capsule Take 200 mg by mouth 2 (two) times daily.   Facility-Administered Encounter Medications as of 02/10/2018  Medication  . 0.9 %  sodium chloride infusion    Allergies as of 02/10/2018 - Review Complete 02/10/2018  Allergen Reaction Noted  . Lamotrigine Swelling 07/03/2015  . Bupropion Other (See Comments) 05/02/2016  . Carbamazepine Other (See Comments) 07/03/2015  . Levetiracetam  07/03/2015  . Tramadol Other (See Comments) 05/02/2016  . Adhesive [tape]  09/08/2014  . Clarithromycin    . Doxycycline    . Nickel  08/11/2014  . Other  08/11/2014  . Estradiol Rash 07/03/2015  . Latex Rash 01/28/2018  . Phenytoin sodium extended Other (See Comments) 07/03/2015    Past Medical History:  Diagnosis Date  . ALLERGIC RHINITIS 05/27/2007   uses Flonase daily as needed and ALbuterol inhaler prn;gets Allergy shots  . Allergy   . Anxiety and depression 12/19/2009  . Arthritis   . Cancer (HCC)    vaginal  . Constipation   . Degenerative disorder of eye   .  Depression   . Diverticulosis   . Environmental allergies   . Essential hypertension, benign 06/26/2009  . GERD 05/27/2007   takes Nexium daily  . Heart murmur    yrs ago  . History of gout   . History of migraine many yrs ago  . Hyperlipidemia    takes Pravastatin daily  . Hypothyroidism   . Insomnia    but doesn't take any meds  . LOW BACK PAIN 05/27/2007  . Numbness    left toes   . Osteoporosis   . Seizure disorder [facility])    reports remote, takes Tegretol daily, followed by neurology  . Seizures ([facility])    last 1983, none since then- last seizure 1988  per pt     Past Surgical History:  Procedure Laterality Date  . ADENOIDECTOMY    . BACK SURGERY     x2  . COLONOSCOPY  2009  . ESOPHAGOGASTRODUODENOSCOPY    . Heel spurs Bilateral   . KNEE ARTHROPLASTY Left 11/07/2013   Procedure: COMPUTER ASSISTED TOTAL KNEE ARTHROPLASTY;  Surgeon: Marybelle Killings, MD;  Location: [facility];  Service: Orthopedics;  Laterality: Left;  Left Total Knee Arthroplasty, Cemented, Computer Assist  . TONSILLECTOMY    . TOTAL KNEE REVISION Left 09/19/2014   Procedure: LEFT TOTAL KNEE REVISION;  Surgeon: Leandrew Koyanagi, MD;  Location: [facility];  Service: Orthopedics;  Laterality: Left;  . UPPER GASTROINTESTINAL ENDOSCOPY      Family History  Problem Relation Age of Onset  . Arthritis Mother   . Stroke Mother   . Heart murmur Other   . Colon cancer Neg Hx   . Esophageal cancer Neg Hx   . Stomach cancer Neg Hx   . Rectal cancer Neg Hx   . Colon polyps Neg Hx     Social History   Socioeconomic History  . Marital status: Married    Spouse name: Not on file  . Number of children: 1  . Years of education: Not on file  . Highest education level: Not on file  Occupational History  . Occupation: Retired  Scientific laboratory technician  . Financial resource strain: Not on file  . Food insecurity:    Worry: Not on file    Inability: Not on file  . Transportation needs:    Medical: Not on file    Non-medical: Not on file  Tobacco Use  . Smoking status: Former Smoker    Packs/day: 0.25    Years: 20.00    Pack years: 5.00    Types: Cigarettes    Last attempt to quit: 09/08/1978    Years since quitting: 39.4  . Smokeless tobacco: Never Used  . Tobacco comment: quit smokikng 72yrs ago  Substance and Sexual Activity  . Alcohol use: No  . Drug use: No  . Sexual activity: Never    Birth control/protection: Post-menopausal  Lifestyle  . Physical activity:    Days per week: Not on file    Minutes per session: Not on file  . Stress: Not on file  Relationships  . Social  connections:    Talks on phone: Not on file    Gets together: Not on file    Attends religious service: Not on file    Active member of club or organization: Not on file    Attends meetings of clubs or organizations: Not on file    Relationship status: Not on file  . Intimate partner violence:    Fear of current or ex  partner: Not on file    Emotionally abused: Not on file    Physically abused: Not on file    Forced sexual activity: Not on file  Other Topics Concern  . Not on file  Social History Narrative  . Not on file    Review of Systems  Constitutional: Positive for fatigue.  HENT: Negative for postnasal drip and rhinorrhea.   Eyes: Negative.   Respiratory: Positive for shortness of breath. Negative for cough.   Cardiovascular: Negative.   Gastrointestinal: Negative.   Endocrine: Negative.   Genitourinary: Negative.   Musculoskeletal: Positive for arthralgias.       Knee pain  Allergic/Immunologic: Positive for environmental allergies.  Neurological: Negative.   Hematological: Negative.   Psychiatric/Behavioral: Positive for dysphoric mood and sleep disturbance.  All other systems reviewed and are negative.   Vitals:   02/10/18 1048  BP: (!) 154/80  Pulse: 75  SpO2: 97%     Physical Exam  Constitutional: She appears well-developed and well-nourished. No distress.  HENT:  Head: Normocephalic and atraumatic.  Eyes: Pupils are equal, round, and reactive to light. Right eye exhibits no discharge. Left eye exhibits no discharge.  Neck: Normal range of motion. Neck supple. No tracheal deviation present. No thyromegaly present.  Cardiovascular: Normal rate and regular rhythm.  Pulmonary/Chest: Effort normal and breath sounds normal. No respiratory distress. She has no wheezes.  Breath sounds coarse at the bases  Abdominal: Soft. Bowel sounds are normal. She exhibits no distension. There is no guarding.  Neurological: No cranial nerve deficit.  Skin: She is not  diaphoretic.  Psychiatric: Her behavior is normal. Thought content normal.   Data Reviewed: Prominent interstitial markings on x-ray reviewed by myself-10/16/2017 Pulmonary function study-no obstruction, mild restriction  Epworth Sleepiness Scale of 10  Assessment:  Chronic shortness of breath-multifactorial -Part of this may be deconditioning limitations from a chronic back pain  Significant back pain and knee pain  Previous smoker, significant exposure to secondhand smoke in the past-no underlying lung disease known -PFT does not reveal any significant obstruction  History of allergies for which she receives allergy shots Possible obstructive sleep apnea-good probability that she has at least moderate sleep apnea  Plan/Recommendations:  Continue to use albuterol as needed  She can discontinue Breo  Regular exercises encouraged  We will schedule her for home sleep study-the probability of significant sleep disordered breathing is at least moderate and this may be contributing to her significant fatigue  Shortness of breath is multifactorial, part of this may be related to musculoskeletal pain and discomfort and also deconditioning   We will see her back in the office in 6 to 8 weeks  Sherrilyn Rist MD [facility] 02/10/2018, 11:22 AM  CC: Lucretia Kern, DO

## 2018-02-11 ENCOUNTER — Ambulatory Visit (INDEPENDENT_AMBULATORY_CARE_PROVIDER_SITE_OTHER): Payer: Medicare HMO | Admitting: Psychology

## 2018-02-11 DIAGNOSIS — F330 Major depressive disorder, recurrent, mild: Secondary | ICD-10-CM | POA: Diagnosis not present

## 2018-02-11 DIAGNOSIS — R69 Illness, unspecified: Secondary | ICD-10-CM | POA: Diagnosis not present

## 2018-02-11 DIAGNOSIS — F411 Generalized anxiety disorder: Secondary | ICD-10-CM | POA: Diagnosis not present

## 2018-02-18 DIAGNOSIS — J3081 Allergic rhinitis due to animal (cat) (dog) hair and dander: Secondary | ICD-10-CM | POA: Diagnosis not present

## 2018-02-18 DIAGNOSIS — J3089 Other allergic rhinitis: Secondary | ICD-10-CM | POA: Diagnosis not present

## 2018-02-18 DIAGNOSIS — J301 Allergic rhinitis due to pollen: Secondary | ICD-10-CM | POA: Diagnosis not present

## 2018-03-03 ENCOUNTER — Other Ambulatory Visit: Payer: Self-pay | Admitting: Internal Medicine

## 2018-03-04 DIAGNOSIS — J3081 Allergic rhinitis due to animal (cat) (dog) hair and dander: Secondary | ICD-10-CM | POA: Diagnosis not present

## 2018-03-04 DIAGNOSIS — J301 Allergic rhinitis due to pollen: Secondary | ICD-10-CM | POA: Diagnosis not present

## 2018-03-04 DIAGNOSIS — J3089 Other allergic rhinitis: Secondary | ICD-10-CM | POA: Diagnosis not present

## 2018-03-10 ENCOUNTER — Ambulatory Visit: Payer: Self-pay | Admitting: Pulmonary Disease

## 2018-03-12 ENCOUNTER — Encounter: Payer: Self-pay | Admitting: Family Medicine

## 2018-03-12 NOTE — Progress Notes (Signed)
HPI:  Using dictation device. Unfortunately this device frequently misinterprets words/phrases.  Beverly Ballard is a pleasant 72 y.o. here for follow up. Chronic medical problems summarized below were reviewed for changes and stability and were updated as needed below. These issues and their treatment remain stable for the most part.  Her mood is actually doing well, better than in the past.  She still feels tired at times, but seems to be doing much better.  She is not getting any regular exercise.  Diet is okay.  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  She continues to see Dr. Glennon Hamilton for counseling. Denies CP, SOB, DOE, treatment intolerance or new symptoms. Due for labs, gyn? Ovary eval?, phq9 -see epic Reports she will be seeing Dr. Benjie Karvonen, her gynecologist next month.  Appointment is already scheduled.  Anxiety and Depression, chronic fatigue: -seeing counselor for CBT -meds: zoloft 125mg   HTN/Aortic atherosclorosis/Obesity/Hyperlipidemia: -meds: asa, lisinopril, pravastatin  Dyspnea: -seeing pulmonology, Dr. Gala Murdoch -had eval in 2019 with PFT, imaging - no sig findings -felt to be multifactorial from allergies, deconditioning, ? Sleep apnea -sleep study pending -uses alb prn  Hypothyroidism: -meds: levothyroxine  GERD: -meds: nexium  Allergic rhinitis: -meds: xyzal, flonase  Ovarian cyst/Hx abnormal pap/Osteopenia/low vit D: -followed by gyn  Chronic back pain/OA: -seeing ortho  ROS: See pertinent positives and negatives per HPI.  Past Medical History:  Diagnosis Date  . Allergy   . Anxiety and depression 12/19/2009  . Arthritis   . Cancer (HCC)    vaginal  . Constipation   . Degenerative disorder of eye   . Diverticulosis   . Essential hypertension, benign 06/26/2009  . GERD 05/27/2007   takes Nexium daily  . Heart murmur    yrs ago  . History of gout   . History of migraine many yrs ago  . Hyperlipidemia    takes  Pravastatin daily  . Hypothyroidism   . Insomnia    but doesn't take any meds  . LOW BACK PAIN 05/27/2007  . Numbness    left toes   . Osteoporosis   . Seizures ([facility])    last 1983, none since then- last seizure 1988 per pt     Past Surgical History:  Procedure Laterality Date  . ADENOIDECTOMY    . BACK SURGERY     x2  . COLONOSCOPY  2009  . ESOPHAGOGASTRODUODENOSCOPY    . Heel spurs Bilateral   . KNEE ARTHROPLASTY Left 11/07/2013   Procedure: COMPUTER ASSISTED TOTAL KNEE ARTHROPLASTY;  Surgeon: Marybelle Killings, MD;  Location: [facility];  Service: Orthopedics;  Laterality: Left;  Left Total Knee Arthroplasty, Cemented, Computer Assist  . TONSILLECTOMY    . TOTAL KNEE REVISION Left 09/19/2014   Procedure: LEFT TOTAL KNEE REVISION;  Surgeon: Leandrew Koyanagi, MD;  Location: [facility];  Service: Orthopedics;  Laterality: Left;  . UPPER GASTROINTESTINAL ENDOSCOPY      Family History  Problem Relation Age of Onset  . Arthritis Mother   . Stroke Mother   . Heart murmur Other   . Colon cancer Neg Hx   . Esophageal cancer Neg Hx   . Stomach cancer Neg Hx   . Rectal cancer Neg Hx   . Colon polyps Neg Hx     SOCIAL HX: See HPI   Current Outpatient Medications:  .  albuterol (PROVENTIL HFA;VENTOLIN HFA) 108 (90 Base) MCG/ACT inhaler, Inhale into the lungs., Disp: , Rfl:  .  aspirin 81 MG tablet, Take 81 mg by mouth daily., Disp: , Rfl:  .  beta carotene w/minerals (OCUVITE) tablet, Take 1 tablet by mouth daily., Disp: , Rfl:  .  Cyanocobalamin (VITAMIN B 12 PO), Take by mouth daily.  , Disp: , Rfl:  .  esomeprazole (NEXIUM) 20 MG capsule, Take 20 mg by mouth daily before breakfast., Disp: , Rfl:  .  fluticasone (FLONASE) 50 MCG/ACT nasal spray, Place 2 sprays into both nostrils daily., Disp: 16 g, Rfl: 6 .  levocetirizine (XYZAL) 5 MG tablet, , Disp: , Rfl:  .  levothyroxine (SYNTHROID, LEVOTHROID) 88 MCG tablet, TAKE 1 TABLET BY MOUTH EVERY DAY, Disp: 45 tablet, Rfl: 3 .  lisinopril  (PRINIVIL,ZESTRIL) 5 MG tablet, TAKE 1 TABLET BY MOUTH EVERY DAY, Disp: 90 tablet, Rfl: 1 .  pravastatin (PRAVACHOL) 20 MG tablet, TAKE 1 TABLET BY MOUTH EVERY DAY, Disp: 90 tablet, Rfl: 1 .  sertraline (ZOLOFT) 100 MG tablet, Take 1 tablet (100 mg total) by mouth daily. (Patient taking differently: Take 125 mg by mouth daily. ), Disp: 90 tablet, Rfl: 1 .  sertraline (ZOLOFT) 25 MG tablet, Take 25 mg by mouth daily., Disp: , Rfl:  .  zonisamide (ZONEGRAN) 100 MG capsule, Take 200 mg by mouth 2 (two) times daily., Disp: , Rfl:   Current Facility-Administered Medications:  .  0.9 %  sodium chloride infusion, 500 mL, Intravenous, Continuous, Armbruster, Carlota Raspberry, MD  EXAM:  Vitals:   03/15/18 1107  BP: (!) 124/58  Pulse: 77  Temp: 98.3 F (36.8 C)    Body mass index is 37.61 kg/m.  GENERAL: vitals reviewed and listed above, alert, oriented, appears well hydrated and in no acute distress  HEENT: atraumatic, conjunttiva clear, no obvious abnormalities on inspection of external nose and ears  NECK: no obvious masses on inspection  LUNGS: clear to auscultation bilaterally, no wheezes, rales or rhonchi, good air movement  CV: HRRR, no peripheral edema  MS: moves all extremities without noticeable abnormality  PSYCH: pleasant and cooperative, no obvious depression or anxiety  ASSESSMENT AND PLAN:  Discussed the following assessment and plan:  Hyperglycemia  Essential hypertension - Plan: Basic metabolic panel, CBC  Aortic atherosclerosis (HCC)  Seizure disorder (HCC)  Recurrent major depressive disorder, in partial remission ([facility])  Postablative hypothyroidism - Plan: TSH  Low vitamin D level - Plan: VITAMIN D 25 Hydroxy (Vit-D Deficiency, Fractures)  -Continue counseling -Advised to gradually increase exercise -Healthy diet advised -Labs per orders -See PHQ 9, updated in epic, seems to be doing better -Advised GYN follow-up, she reports this is scheduled for next  month -Follow-up in 3 months, sooner as needed   Patient Instructions  BEFORE YOU LEAVE: -labs -enter phq9 in epic -follow up: 3-4 months  See your gynecologist as planned for follow up.  Try to get outside and increase exercise gradually to a goal of 150 minutes per week.  Eat a healthy low sugar diet.  We have ordered labs or studies at this visit. It can take up to 1-2 weeks for results and processing. IF results require follow up or explanation, we will call you with instructions. Clinically stable results will be released to your [facility]. If you have not heard from [place] or cannot find your results in [facility] in 2 weeks please contact our office at [phone].  If you are not yet signed up for [facility], please consider signing up.          Lucretia Kern, DO

## 2018-03-15 ENCOUNTER — Ambulatory Visit (INDEPENDENT_AMBULATORY_CARE_PROVIDER_SITE_OTHER): Payer: Medicare HMO | Admitting: Family Medicine

## 2018-03-15 ENCOUNTER — Encounter: Payer: Self-pay | Admitting: Family Medicine

## 2018-03-15 VITALS — BP 124/58 | HR 77 | Temp 98.3°F | Ht 64.0 in | Wt 219.1 lb

## 2018-03-15 DIAGNOSIS — R739 Hyperglycemia, unspecified: Secondary | ICD-10-CM | POA: Diagnosis not present

## 2018-03-15 DIAGNOSIS — I700 Atherosclerosis of aorta: Secondary | ICD-10-CM | POA: Diagnosis not present

## 2018-03-15 DIAGNOSIS — R7989 Other specified abnormal findings of blood chemistry: Secondary | ICD-10-CM

## 2018-03-15 DIAGNOSIS — G40909 Epilepsy, unspecified, not intractable, without status epilepticus: Secondary | ICD-10-CM

## 2018-03-15 DIAGNOSIS — E890 Postprocedural hypothyroidism: Secondary | ICD-10-CM | POA: Diagnosis not present

## 2018-03-15 DIAGNOSIS — J3089 Other allergic rhinitis: Secondary | ICD-10-CM | POA: Diagnosis not present

## 2018-03-15 DIAGNOSIS — I10 Essential (primary) hypertension: Secondary | ICD-10-CM

## 2018-03-15 DIAGNOSIS — E559 Vitamin D deficiency, unspecified: Secondary | ICD-10-CM | POA: Diagnosis not present

## 2018-03-15 DIAGNOSIS — J301 Allergic rhinitis due to pollen: Secondary | ICD-10-CM | POA: Diagnosis not present

## 2018-03-15 DIAGNOSIS — J3081 Allergic rhinitis due to animal (cat) (dog) hair and dander: Secondary | ICD-10-CM | POA: Diagnosis not present

## 2018-03-15 DIAGNOSIS — R69 Illness, unspecified: Secondary | ICD-10-CM | POA: Diagnosis not present

## 2018-03-15 DIAGNOSIS — F3341 Major depressive disorder, recurrent, in partial remission: Secondary | ICD-10-CM

## 2018-03-15 LAB — BASIC METABOLIC PANEL
BUN: 11 mg/dL (ref 6–23)
CALCIUM: 9.1 mg/dL (ref 8.4–10.5)
CO2: 29 meq/L (ref 19–32)
CREATININE: 0.81 mg/dL (ref 0.40–1.20)
Chloride: 108 mEq/L (ref 96–112)
GFR: 74.27 mL/min (ref >60.00)
Glucose, Bld: 74 mg/dL (ref 70–99)
Potassium: 4.4 mEq/L (ref 3.5–5.1)
Sodium: 143 mEq/L (ref 135–145)

## 2018-03-15 LAB — CBC
HCT: 40.7 % (ref 36.0–46.0)
Hemoglobin: 13.6 g/dL (ref 12.0–15.0)
MCHC: 33.4 g/dL (ref 30.0–36.0)
MCV: 94.8 fl (ref 78.0–100.0)
Platelets: 180 10*3/uL (ref 150.0–400.0)
RBC: 4.29 Mil/uL (ref 3.87–5.11)
RDW: 13.3 % (ref 11.5–15.5)
WBC: 5.8 10*3/uL (ref 4.0–10.5)

## 2018-03-15 LAB — VITAMIN D 25 HYDROXY (VIT D DEFICIENCY, FRACTURES): VITD: 25.2 ng/mL — AB (ref 30.00–100.00)

## 2018-03-15 LAB — TSH: TSH: 1.04 u[IU]/mL (ref 0.35–4.50)

## 2018-03-15 NOTE — Patient Instructions (Signed)
BEFORE YOU LEAVE: -labs -enter phq9 in epic -follow up: 3-4 months  See your gynecologist as planned for follow up.  Try to get outside and increase exercise gradually to a goal of 150 minutes per week.  Eat a healthy low sugar diet.  We have ordered labs or studies at this visit. It can take up to 1-2 weeks for results and processing. IF results require follow up or explanation, we will call you with instructions. Clinically stable results will be released to your [facility]. If you have not heard from [place] or cannot find your results in [facility] in 2 weeks please contact our office at [phone].  If you are not yet signed up for [facility], please consider signing up.

## 2018-03-16 ENCOUNTER — Telehealth: Payer: Self-pay | Admitting: Family Medicine

## 2018-03-16 NOTE — Telephone Encounter (Signed)
11/26-see results note. Beverly Ballard

## 2018-03-16 NOTE — Telephone Encounter (Signed)
Copied from [place] [id number]. Topic: Quick Communication - Lab Results (Clinic Use ONLY) >> Mar 16, 2018  8:25 AM Agnes Lawrence, CMA wrote: Called patient to inform them of lab results. When patient returns call, triage nurse may disclose results. >> Mar 16, 2018  9:28 AM Antonieta Iba C wrote: Pt is requesting that Mechele Claude gives her a call back directly to go over results.

## 2018-03-23 ENCOUNTER — Encounter: Payer: Self-pay | Admitting: Internal Medicine

## 2018-03-23 ENCOUNTER — Ambulatory Visit: Payer: Medicare HMO | Admitting: Internal Medicine

## 2018-03-23 ENCOUNTER — Ambulatory Visit: Payer: Self-pay | Admitting: Endocrinology

## 2018-03-23 VITALS — BP 120/72 | HR 88 | Ht 64.0 in | Wt 220.0 lb

## 2018-03-23 DIAGNOSIS — E890 Postprocedural hypothyroidism: Secondary | ICD-10-CM

## 2018-03-23 DIAGNOSIS — R5383 Other fatigue: Secondary | ICD-10-CM | POA: Insufficient documentation

## 2018-03-23 NOTE — Patient Instructions (Addendum)
Please move Levothyroxine 88 mcg daily to am.  Take the thyroid hormone every day, with water, at least 30 minutes before breakfast, separated by at least 4 hours from: - acid reflux medications - calcium - iron - multivitamins  Please return for labs in January.  Please come back for a follow-up appointment in 1 year.

## 2018-03-23 NOTE — Progress Notes (Signed)
Patient ID: Beverly Ballard, female   DOB: 06-03-1945, 72 y.o.   MRN: [id number]    HPI  Beverly Ballard is a 72 y.o.-year-old female, returning for follow-up for  uncontrolled post ablative hypothyroidism.  Last visit 6 months ago. She saw Dr. Wilson Singer before, but came to see me after he retired.  Reviewed and addended hx: Patient was diagnosed with hypothyroidism in the 80s after RAI treatment for Graves' disease.  She was initially started on Synthroid, now on levothyroxine.  She has been on high doses of levothyroxine, even 150 mcg for several years before she started to see Dr. Maudie Mercury, who has been trying to reduce her levothyroxine dose to improve her TFTs to the normal range.  However, since TFTs were fluctuating, she was referred to endocrinology.  She was prev. on 100 mcg levothyroxine daily.  After we optimized how she was taking the medication (see below), her TSH became suppressed again >> dose decreased to 88 mcg daily. Subsequent TFTs were normal.  Pt continues on levothyroxine 88 mcg daily, taken: - Moved it at bedtime after last visit (?) - + Ca, MVI, PPIs >4h after LT4 - not on Fe - + Biotin as B complex - takes this at night  Reviewed pt's TFTs; Lab Results  Component Value Date   TSH 1.04 03/15/2018   TSH 0.58 11/04/2017   TSH 0.09 (L) 09/16/2017   TSH 1.25 04/16/2017   TSH 0.18 (L) 02/06/2017   TSH 0.12 (L) 12/23/2016   TSH 0.11 (L) 11/06/2016   TSH 0.13 (L) 09/22/2016   TSH 0.23 (L) 06/19/2016   TSH 1.11 04/24/2016   FREET4 0.77 11/04/2017   FREET4 0.89 09/16/2017   FREET4 0.77 04/16/2017   FREET4 0.87 02/10/2017    Pt denies: - feeling nodules in neck - hoarseness - dysphagia - choking - SOB with lying down  She has no FH of thyroid disorders. + FH of thyroid cancer (half sister by mother). No h/o radiation tx to head or neck except RAI tx in the 80s.  No seaweed or kelp. No recent contrast studies. No herbal supplements. No Biotin use. No recent steroids use.    Pt. also has a history of epilepsy ( last seizure 1988), HTN, HL, GERD, anxiety + depression - on Zoloft She also has a history of low abdominal pain that has been extensively investigated in the past.  She is seeing Dr. Havery Moros.  Her mother lives in [place].  ROS: Constitutional: + weight gain/no weight loss, no fatigue, no subjective hyperthermia, no subjective hypothermia Eyes: no blurry vision, no xerophthalmia ENT: no sore throat, + see HPI Cardiovascular: no CP/no SOB/no palpitations/no leg swelling Respiratory: no cough/no SOB/no wheezing Gastrointestinal: no N/no V/no D/no C/no acid reflux Musculoskeletal: no muscle aches/no joint aches Skin: no rashes, no hair loss Neurological: + tremors/no numbness/no tingling/no dizziness  I reviewed pt's medications, allergies, PMH, social hx, family hx, and changes were documented in the history of present illness. Otherwise, unchanged from my initial visit note.   Past Medical History:  Diagnosis Date  . Allergy   . Anxiety and depression 12/19/2009  . Arthritis   . Cancer (HCC)    vaginal  . Constipation   . Degenerative disorder of eye   . Diverticulosis   . Essential hypertension, benign 06/26/2009  . GERD 05/27/2007   takes Nexium daily  . Heart murmur    yrs ago  . History of gout   . History of migraine many yrs  ago  . Hyperlipidemia    takes Pravastatin daily  . Hypothyroidism   . Insomnia    but doesn't take any meds  . LOW BACK PAIN 05/27/2007  . Numbness    left toes   . Osteoporosis   . Seizures ([place])    last 1983, none since then- last seizure 1988 per pt    Past Surgical History:  Procedure Laterality Date  . ADENOIDECTOMY    . BACK SURGERY     x2  . COLONOSCOPY  2009  . ESOPHAGOGASTRODUODENOSCOPY    . Heel spurs Bilateral   . KNEE ARTHROPLASTY Left 11/07/2013   Procedure: COMPUTER ASSISTED TOTAL KNEE ARTHROPLASTY;  Surgeon: Marybelle Killings, MD;  Location: [facility];  Service: Orthopedics;  Laterality:  Left;  Left Total Knee Arthroplasty, Cemented, Computer Assist  . TONSILLECTOMY    . TOTAL KNEE REVISION Left 09/19/2014   Procedure: LEFT TOTAL KNEE REVISION;  Surgeon: Leandrew Koyanagi, MD;  Location: [facility];  Service: Orthopedics;  Laterality: Left;  . UPPER GASTROINTESTINAL ENDOSCOPY     Social History   Socioeconomic History  . Marital status: Married    Spouse name: Not on file  . Number of children: 1  . Years of education: Not on file  . Highest education level: Not on file  Occupational History  . Occupation: Retired  Scientific laboratory technician  . Financial resource strain: Not on file  . Food insecurity:    Worry: Not on file    Inability: Not on file  . Transportation needs:    Medical: Not on file    Non-medical: Not on file  Tobacco Use  . Smoking status: Former Smoker    Packs/day: 0.25    Years: 20.00    Pack years: 5.00    Types: Cigarettes    Last attempt to quit: 09/08/1978    Years since quitting: 39.5  . Smokeless tobacco: Never Used  . Tobacco comment: quit smokikng 17yrs ago  Substance and Sexual Activity  . Alcohol use: No  . Drug use: No  . Sexual activity: Never    Birth control/protection: Post-menopausal  Lifestyle  . Physical activity:    Days per week: Not on file    Minutes per session: Not on file  . Stress: Not on file  Relationships  . Social connections:    Talks on phone: Not on file    Gets together: Not on file    Attends religious service: Not on file    Active member of club or organization: Not on file    Attends meetings of clubs or organizations: Not on file    Relationship status: Not on file  . Intimate partner violence:    Fear of current or ex partner: Not on file    Emotionally abused: Not on file    Physically abused: Not on file    Forced sexual activity: Not on file  Other Topics Concern  . Not on file  Social History Narrative  . Not on file   Current Outpatient Medications on File Prior to Visit  Medication Sig Dispense  Refill  . albuterol (PROVENTIL HFA;VENTOLIN HFA) 108 (90 Base) MCG/ACT inhaler Inhale into the lungs.    Marland Kitchen aspirin 81 MG tablet Take 81 mg by mouth daily.    . beta carotene w/minerals (OCUVITE) tablet Take 1 tablet by mouth daily.    . Cyanocobalamin (VITAMIN B 12 PO) Take by mouth daily.      Marland Kitchen esomeprazole ([place]) 20  MG capsule Take 20 mg by mouth daily before breakfast.    . fluticasone (FLONASE) 50 MCG/ACT nasal spray Place 2 sprays into both nostrils daily. 16 g 6  . levocetirizine (XYZAL) 5 MG tablet     . levothyroxine (SYNTHROID, LEVOTHROID) 88 MCG tablet TAKE 1 TABLET BY MOUTH EVERY DAY 45 tablet 3  . lisinopril (PRINIVIL,ZESTRIL) 5 MG tablet TAKE 1 TABLET BY MOUTH EVERY DAY 90 tablet 1  . pravastatin (PRAVACHOL) 20 MG tablet TAKE 1 TABLET BY MOUTH EVERY DAY 90 tablet 1  . sertraline (ZOLOFT) 100 MG tablet Take 1 tablet (100 mg total) by mouth daily. (Patient taking differently: Take 125 mg by mouth daily. ) 90 tablet 1  . sertraline (ZOLOFT) 25 MG tablet Take 25 mg by mouth daily.    Marland Kitchen zonisamide (ZONEGRAN) 100 MG capsule Take 200 mg by mouth 2 (two) times daily.     Current Facility-Administered Medications on File Prior to Visit  Medication Dose Route Frequency Provider Last Rate Last Dose  . 0.9 %  sodium chloride infusion  500 mL Intravenous Continuous Armbruster, Carlota Raspberry, MD       Allergies  Allergen Reactions  . Lamotrigine Swelling    Tongue swelling  . Bupropion Other (See Comments)    not known  . Carbamazepine Other (See Comments)    Hypnatremia  . Levetiracetam     Other reaction(s): Dizziness (intolerance)  . Tramadol Other (See Comments)    not known  . Adhesive [Tape]     ?  Blister after knee surgery  . Clarithromycin     Unsure of reaction    . Doxycycline     Interfered with seizure medication  . Nickel     Had to remove knee implant with nickel and replace it  . Other     Metal and perfumes  . Estradiol Rash  . Latex Rash  . Phenytoin Sodium  Extended Other (See Comments)    drowsiness   Family History  Problem Relation Age of Onset  . Arthritis Mother   . Stroke Mother   . Heart murmur Other   . Colon cancer Neg Hx   . Esophageal cancer Neg Hx   . Stomach cancer Neg Hx   . Rectal cancer Neg Hx   . Colon polyps Neg Hx    PE: BP 120/72   Pulse 88   Ht 5\' 4"  (1.626 m) Comment: measured  Wt 220 lb (99.8 kg)   SpO2 96%   BMI 37.76 kg/m  Wt Readings from Last 3 Encounters:  03/23/18 220 lb (99.8 kg)  03/15/18 219 lb 1.6 oz (99.4 kg)  02/10/18 217 lb (98.4 kg)   Constitutional: overweight, in NAD Eyes: PERRLA, EOMI, no exophthalmos ENT: moist mucous membranes, no thyromegaly, no cervical lymphadenopathy Cardiovascular: RRR, No MRG Respiratory: CTA B Gastrointestinal: abdomen soft, NT, ND, BS+ Musculoskeletal: no deformities, strength intact in all 4 Skin: moist, warm, no rashes Neurological: + tremor with outstretched hands, DTR normal in all 4  ASSESSMENT: 1.  Post ablative hypothyroidism - After RAI treatment for Graves' disease in the 80s.  2.  Fatigue  PLAN:  1. Patient with long-standing, previously uncontrolled, post ablative hypothyroidism, on levothyroxine therapy.  She has been on a high dose of levothyroxine in the past with persistently suppressed TSH levels.  She was also not taking the levothyroxine correctly and we discussed how to do so.  Also, we changed from 112 to 100 to 88 mcg daily before this  visit and her TSH finally normalized.   - latest thyroid labs reviewed with pt >> normal few days ago - she continues on LT4 88 mcg daily - pt feels good on this dose.  However, she does have a weight gain (10 pounds in the last few months) - we discussed about taking the thyroid hormone every day, with water, >30 minutes before breakfast, separated by >4 hours from acid reflux medications, calcium, iron, multivitamins. Pt. was taking it correctly at last visit, but since then she will levothyroxine at  night.  I am not sure why she did so, and she is not sure either.  We discussed that taking it at night does not allow the absorption of levothyroxine, since she is not usually on an empty stomach.  She agrees to move it to morning.  She does not drink coffee but drinks tea with honey in the morning.  I advised her that she can take the levothyroxine weekly.  We will move the PPIs and calcium more than 4 hours later. - I advised him to come back for labs in 2 months  2.  Fatigue -New problem -It is possible that she developed fatigue after she moved levothyroxine at night.  Please see above discussion about moving it in the morning. -Recent vitamin D was slightly low and she was advised to increase her supplement from 500 to 1500 units daily. -Reviewed her CBC and she is not anemic -We discussed about the possibility of B12 deficiency and we will check a B12 when she comes back for repeat TFTs in 2 months.  Orders Placed This Encounter  Procedures  . TSH  . T4, free  . Vitamin B12    Philemon Kingdom, MD PhD [facility]

## 2018-03-29 ENCOUNTER — Ambulatory Visit (INDEPENDENT_AMBULATORY_CARE_PROVIDER_SITE_OTHER): Payer: Medicare HMO | Admitting: Psychology

## 2018-03-29 DIAGNOSIS — R69 Illness, unspecified: Secondary | ICD-10-CM | POA: Diagnosis not present

## 2018-03-29 DIAGNOSIS — F411 Generalized anxiety disorder: Secondary | ICD-10-CM | POA: Diagnosis not present

## 2018-03-29 DIAGNOSIS — F330 Major depressive disorder, recurrent, mild: Secondary | ICD-10-CM | POA: Diagnosis not present

## 2018-03-30 DIAGNOSIS — Z1231 Encounter for screening mammogram for malignant neoplasm of breast: Secondary | ICD-10-CM | POA: Diagnosis not present

## 2018-03-30 DIAGNOSIS — Z01419 Encounter for gynecological examination (general) (routine) without abnormal findings: Secondary | ICD-10-CM | POA: Diagnosis not present

## 2018-03-30 DIAGNOSIS — Z124 Encounter for screening for malignant neoplasm of cervix: Secondary | ICD-10-CM | POA: Diagnosis not present

## 2018-03-30 LAB — HM MAMMOGRAPHY

## 2018-03-31 ENCOUNTER — Encounter (INDEPENDENT_AMBULATORY_CARE_PROVIDER_SITE_OTHER): Payer: Self-pay | Admitting: Family Medicine

## 2018-03-31 ENCOUNTER — Ambulatory Visit (INDEPENDENT_AMBULATORY_CARE_PROVIDER_SITE_OTHER): Payer: Medicare HMO

## 2018-03-31 ENCOUNTER — Ambulatory Visit (INDEPENDENT_AMBULATORY_CARE_PROVIDER_SITE_OTHER): Payer: Medicare HMO | Admitting: Family Medicine

## 2018-03-31 DIAGNOSIS — M79671 Pain in right foot: Secondary | ICD-10-CM

## 2018-03-31 DIAGNOSIS — M79605 Pain in left leg: Secondary | ICD-10-CM

## 2018-03-31 DIAGNOSIS — M25561 Pain in right knee: Secondary | ICD-10-CM | POA: Diagnosis not present

## 2018-03-31 DIAGNOSIS — S92355A Nondisplaced fracture of fifth metatarsal bone, left foot, initial encounter for closed fracture: Secondary | ICD-10-CM | POA: Diagnosis not present

## 2018-03-31 NOTE — Progress Notes (Signed)
Office Visit Note   Patient: Beverly Ballard           Date of Birth: May 20, 1945           MRN: [id number] Visit Date: 03/31/2018 Requested by: Lucretia Kern, DO [address] [place], [place] [address] PCP: Lucretia Kern, DO  Subjective: Chief Complaint  Patient presents with  . Right Foot - Pain    Fell from step ladder last night - leg was caught in the rungs.  . Right Knee - Pain    HPI: She is a 72 year old with right knee and foot pain.  Last night she was on the second step of a stepladder, lost her balance and fell.  Immediate pain in her right leg from below the knee to the lateral foot.  Able to bear weight, but she has had bruising and swelling fairly rapidly.  Left knee was bruised but seems to be okay.  She is status post left knee replacement.  She has a history of osteoporosis but has only had 1 fracture in her hand in the past, not related to her osteoporosis.                ROS: She has multiple other medical problems.  These are well documented in her chart.  Objective: Vital Signs: There were no vitals taken for this visit.  Physical Exam:  Right leg: There is a small abrasion distal to her knee anteriorly.  There is bruising in this area and she is very tender to palpation here.  Extensor mechanism is intact.  Right foot is swollen and bruised on the lateral aspect, exquisitely tender along the fourth and fifth metatarsal shafts.   Imaging: X-rays right knee: Anatomic alignment with some moderate degenerative changes but no definite fracture.  AP view of the left knee shows intact surgical hardware with no obvious fracture.  X-rays right foot: There is a nondisplaced proximal fifth metatarsal fracture.  Midfoot DJD but no other obvious fracture seen.    Assessment & Plan: 1.  Right foot proximal fifth metatarsal fracture, anatomic alignment. -Fracture boot, follow-up in 2 weeks for two-view x-ray to assess fracture alignment.  Anticipate 6 to 12  weeks healing time.  2.  Right knee contusion 1 day status post fall -Weightbearing as tolerated, follow-up as needed.   Follow-Up Instructions: No follow-ups on file.      Procedures: No procedures performed  No notes on file    PMFS History: Patient Active Problem List   Diagnosis Date Noted  . Fatigue 03/23/2018  . Recurrent major depressive disorder, in partial remission ([place]) 05/04/2017  . Left knee pain 01/12/2017  . Ovarian cyst 10/30/2016  . Aortic atherosclerosis ([place]) 11/19/2015  . Hyperlipemia 04/03/2015  . S/P revision of total knee 09/19/2014  . Seizure disorder ([place]) 01/24/2014  . Obesity, BMI unknown 01/24/2014  . Osteopenia, stable on dexa 2010 - followed by gyn 06/15/2012  . Pap smear abnormality of cervix/human papillomavirus (HPV) positive - 09/2011, followed by gyn 06/15/2012  . Essential hypertension 07/03/2009  . Hypothyroidism 05/27/2007  . Allergic rhinitis 05/27/2007  . GERD 05/27/2007   Past Medical History:  Diagnosis Date  . Allergy   . Anxiety and depression 12/19/2009  . Arthritis   . Cancer (HCC)    vaginal  . Constipation   . Degenerative disorder of eye   . Diverticulosis   . Essential hypertension, benign 06/26/2009  . GERD 05/27/2007   takes Nexium daily  .  Heart murmur    yrs ago  . History of gout   . History of migraine many yrs ago  . Hyperlipidemia    takes Pravastatin daily  . Hypothyroidism   . Insomnia    but doesn't take any meds  . LOW BACK PAIN 05/27/2007  . Numbness    left toes   . Osteoporosis   . Seizures ([place])    last 1983, none since then- last seizure 1988 per pt     Family History  Problem Relation Age of Onset  . Arthritis Mother   . Stroke Mother   . Heart murmur Other   . Colon cancer Neg Hx   . Esophageal cancer Neg Hx   . Stomach cancer Neg Hx   . Rectal cancer Neg Hx   . Colon polyps Neg Hx     Past Surgical History:  Procedure Laterality Date  . ADENOIDECTOMY    . BACK SURGERY     x2    . COLONOSCOPY  2009  . ESOPHAGOGASTRODUODENOSCOPY    . Heel spurs Bilateral   . KNEE ARTHROPLASTY Left 11/07/2013   Procedure: COMPUTER ASSISTED TOTAL KNEE ARTHROPLASTY;  Surgeon: Marybelle Killings, MD;  Location: [facility];  Service: Orthopedics;  Laterality: Left;  Left Total Knee Arthroplasty, Cemented, Computer Assist  . TONSILLECTOMY    . TOTAL KNEE REVISION Left 09/19/2014   Procedure: LEFT TOTAL KNEE REVISION;  Surgeon: Leandrew Koyanagi, MD;  Location: [facility];  Service: Orthopedics;  Laterality: Left;  . UPPER GASTROINTESTINAL ENDOSCOPY     Social History   Occupational History  . Occupation: Retired  Tobacco Use  . Smoking status: Former Smoker    Packs/day: 0.25    Years: 20.00    Pack years: 5.00    Types: Cigarettes    Last attempt to quit: 09/08/1978    Years since quitting: 39.5  . Smokeless tobacco: Never Used  . Tobacco comment: quit smokikng 22yrs ago  Substance and Sexual Activity  . Alcohol use: No  . Drug use: No  . Sexual activity: Never    Birth control/protection: Post-menopausal

## 2018-04-07 ENCOUNTER — Encounter: Payer: Self-pay | Admitting: Family Medicine

## 2018-04-07 DIAGNOSIS — J301 Allergic rhinitis due to pollen: Secondary | ICD-10-CM | POA: Diagnosis not present

## 2018-04-07 DIAGNOSIS — J3081 Allergic rhinitis due to animal (cat) (dog) hair and dander: Secondary | ICD-10-CM | POA: Diagnosis not present

## 2018-04-07 DIAGNOSIS — J3089 Other allergic rhinitis: Secondary | ICD-10-CM | POA: Diagnosis not present

## 2018-04-08 ENCOUNTER — Ambulatory Visit (INDEPENDENT_AMBULATORY_CARE_PROVIDER_SITE_OTHER): Payer: Medicare HMO | Admitting: Orthopaedic Surgery

## 2018-04-08 ENCOUNTER — Encounter (INDEPENDENT_AMBULATORY_CARE_PROVIDER_SITE_OTHER): Payer: Self-pay | Admitting: Orthopaedic Surgery

## 2018-04-08 DIAGNOSIS — M25561 Pain in right knee: Secondary | ICD-10-CM

## 2018-04-08 MED ORDER — ACETAMINOPHEN-CODEINE #3 300-30 MG PO TABS: 1.0000 | ORAL_TABLET | Freq: Every day | ORAL | 0 refills | Status: AC | PRN

## 2018-04-08 NOTE — Progress Notes (Signed)
Office Visit Note   Patient: Beverly Ballard           Date of Birth: 1946/04/16           MRN: [id number] Visit Date: 04/08/2018              Requested by: Lucretia Kern, DO [address] [place], [place] [address] PCP: Lucretia Kern, DO   Assessment & Plan: Visit Diagnoses:  1. Right knee pain, unspecified chronicity     Plan: Impression is bilateral lower extremity subcutaneous hematoma and contusion.  I recommend supportive treatment with rest and compression and heat.  I gave her a prescription for Tylenol 3 to help with the pain.  She will follow-up with Dr. Junius Roads for her fifth metatarsal fracture.  Questions encouraged and answered.  I will see her back as scheduled for her left total knee replacement.  Follow-Up Instructions: Return if symptoms worsen or fail to improve.   Orders:  No orders of the defined types were placed in this encounter.  Meds ordered this encounter  Medications  . acetaminophen-codeine (TYLENOL #3) 300-30 MG tablet    Sig: Take 1-2 tablets by mouth daily as needed for up to 7 days for moderate pain.    Dispense:  30 tablet    Refill:  0      Procedures: No procedures performed   Clinical Data: No additional findings.   Subjective: Chief Complaint  Patient presents with  . Right Knee - Pain    Edris comes in today for follow-up of her right knee and left leg pain.  She saw Dr. Junius Roads a week ago for this.  She states that she is significant sensitivity to touch over the ecchymosis of both legs.  There is no significant increase in pain with weightbearing.   Review of Systems   Objective: Vital Signs: There were no vitals taken for this visit.  Physical Exam  Ortho Exam Bilateral leg exam shows a small subcutaneous hematoma with overlying ecchymosis.  There is no skin compromise.  Neurovascular intact distally. Specialty Comments:  No specialty comments available.  Imaging: No results found.   PMFS History: Patient  Active Problem List   Diagnosis Date Noted  . Fatigue 03/23/2018  . Recurrent major depressive disorder, in partial remission ([place]) 05/04/2017  . Left knee pain 01/12/2017  . Ovarian cyst 10/30/2016  . Aortic atherosclerosis ([place]) 11/19/2015  . Hyperlipemia 04/03/2015  . S/P revision of total knee 09/19/2014  . Seizure disorder ([place]) 01/24/2014  . Obesity, BMI unknown 01/24/2014  . Osteopenia, stable on dexa 2010 - followed by gyn 06/15/2012  . Pap smear abnormality of cervix/human papillomavirus (HPV) positive - 09/2011, followed by gyn 06/15/2012  . Essential hypertension 07/03/2009  . Hypothyroidism 05/27/2007  . Allergic rhinitis 05/27/2007  . GERD 05/27/2007   Past Medical History:  Diagnosis Date  . Allergy   . Anxiety and depression 12/19/2009  . Arthritis   . Cancer (HCC)    vaginal  . Constipation   . Degenerative disorder of eye   . Diverticulosis   . Essential hypertension, benign 06/26/2009  . GERD 05/27/2007   takes Nexium daily  . Heart murmur    yrs ago  . History of gout   . History of migraine many yrs ago  . Hyperlipidemia    takes Pravastatin daily  . Hypothyroidism   . Insomnia    but doesn't take any meds  . LOW BACK PAIN 05/27/2007  .  Numbness    left toes   . Osteoporosis   . Seizures ([place])    last 1983, none since then- last seizure 1988 per pt     Family History  Problem Relation Age of Onset  . Arthritis Mother   . Stroke Mother   . Heart murmur Other   . Colon cancer Neg Hx   . Esophageal cancer Neg Hx   . Stomach cancer Neg Hx   . Rectal cancer Neg Hx   . Colon polyps Neg Hx     Past Surgical History:  Procedure Laterality Date  . ADENOIDECTOMY    . BACK SURGERY     x2  . COLONOSCOPY  2009  . ESOPHAGOGASTRODUODENOSCOPY    . Heel spurs Bilateral   . KNEE ARTHROPLASTY Left 11/07/2013   Procedure: COMPUTER ASSISTED TOTAL KNEE ARTHROPLASTY;  Surgeon: Marybelle Killings, MD;  Location: [facility];  Service: Orthopedics;  Laterality: Left;  Left  Total Knee Arthroplasty, Cemented, Computer Assist  . TONSILLECTOMY    . TOTAL KNEE REVISION Left 09/19/2014   Procedure: LEFT TOTAL KNEE REVISION;  Surgeon: Leandrew Koyanagi, MD;  Location: [facility];  Service: Orthopedics;  Laterality: Left;  . UPPER GASTROINTESTINAL ENDOSCOPY     Social History   Occupational History  . Occupation: Retired  Tobacco Use  . Smoking status: Former Smoker    Packs/day: 0.25    Years: 20.00    Pack years: 5.00    Types: Cigarettes    Last attempt to quit: 09/08/1978    Years since quitting: 39.6  . Smokeless tobacco: Never Used  . Tobacco comment: quit smokikng 72yrs ago  Substance and Sexual Activity  . Alcohol use: No  . Drug use: No  . Sexual activity: Never    Birth control/protection: Post-menopausal

## 2018-04-15 DIAGNOSIS — J301 Allergic rhinitis due to pollen: Secondary | ICD-10-CM | POA: Diagnosis not present

## 2018-04-19 ENCOUNTER — Ambulatory Visit (INDEPENDENT_AMBULATORY_CARE_PROVIDER_SITE_OTHER): Payer: Medicare HMO | Admitting: Family Medicine

## 2018-04-20 ENCOUNTER — Encounter (INDEPENDENT_AMBULATORY_CARE_PROVIDER_SITE_OTHER): Payer: Self-pay | Admitting: Family Medicine

## 2018-04-20 ENCOUNTER — Ambulatory Visit (INDEPENDENT_AMBULATORY_CARE_PROVIDER_SITE_OTHER): Payer: Medicare HMO | Admitting: Family Medicine

## 2018-04-20 ENCOUNTER — Ambulatory Visit (INDEPENDENT_AMBULATORY_CARE_PROVIDER_SITE_OTHER): Payer: Medicare HMO

## 2018-04-20 DIAGNOSIS — S92354D Nondisplaced fracture of fifth metatarsal bone, right foot, subsequent encounter for fracture with routine healing: Secondary | ICD-10-CM | POA: Diagnosis not present

## 2018-04-20 DIAGNOSIS — M79605 Pain in left leg: Secondary | ICD-10-CM

## 2018-04-20 NOTE — Progress Notes (Signed)
Office Visit Note   Patient: Beverly Ballard           Date of Birth: [birth year]           MRN: [id number] Visit Date: 04/20/2018 Requested by: Lucretia Kern, DO [address] [place], [place] [address] PCP: Lucretia Kern, DO  Subjective: Chief Complaint  Patient presents with  . Right Foot - Follow-up    FWB in fracture boot    HPI: He is about 3 weeks status post right foot proximal fifth metatarsal fracture.  Pain steadily improving, wearing her fracture boot.  Still having discomfort in the left lower leg from her hematoma.              ROS: Noncontributory  Objective: Vital Signs: There were no vitals taken for this visit.  Physical Exam:  Right foot: Slightly tender at the proximal fifth metatarsal with pain on eversion against resistance. Left leg: Medial tibial hematoma still present, no sign of infection but very tender.  Imaging: X-rays right foot: Stable alignment of fifth metatarsal fracture with early callus formation present.    Assessment & Plan: 1.  Clinically healing 3 weeks status post right foot proximal fifth metatarsal fracture -Okay to wean from fracture boot as pain permits.  Okay to drive when comfortable pushing on breaks.  Follow-up in 3 to 4 weeks as needed, x-ray if still having pain.  2.  Left leg hematoma -Physical therapy referral for modalities.   Follow-Up Instructions: Return in about 3 weeks (around 05/11/2018), or if symptoms worsen or fail to improve.      Procedures: No procedures performed  No notes on file    PMFS History: Patient Active Problem List   Diagnosis Date Noted  . Fatigue 03/23/2018  . Recurrent major depressive disorder, in partial remission ([place]) 05/04/2017  . Left knee pain 01/12/2017  . Ovarian cyst 10/30/2016  . Aortic atherosclerosis ([place]) 11/19/2015  . Hyperlipemia 04/03/2015  . S/P revision of total knee 09/19/2014  . Seizure disorder ([place]) 01/24/2014  . Obesity, BMI unknown 01/24/2014  .  Osteopenia, stable on dexa 2010 - followed by gyn 06/15/2012  . Pap smear abnormality of cervix/human papillomavirus (HPV) positive - 09/2011, followed by gyn 06/15/2012  . Essential hypertension 07/03/2009  . Hypothyroidism 05/27/2007  . Allergic rhinitis 05/27/2007  . GERD 05/27/2007   Past Medical History:  Diagnosis Date  . Allergy   . Anxiety and depression 12/19/2009  . Arthritis   . Cancer (HCC)    vaginal  . Constipation   . Degenerative disorder of eye   . Diverticulosis   . Essential hypertension, benign 06/26/2009  . GERD 05/27/2007   takes Nexium daily  . Heart murmur    yrs ago  . History of gout   . History of migraine many yrs ago  . Hyperlipidemia    takes Pravastatin daily  . Hypothyroidism   . Insomnia    but doesn't take any meds  . LOW BACK PAIN 05/27/2007  . Numbness    left toes   . Osteoporosis   . Seizures ([place])    last 1983, none since then- last seizure 1988 per pt     Family History  Problem Relation Age of Onset  . Arthritis Mother   . Stroke Mother   . Heart murmur Other   . Colon cancer Neg Hx   . Esophageal cancer Neg Hx   . Stomach cancer Neg Hx   . Rectal cancer  Neg Hx   . Colon polyps Neg Hx     Past Surgical History:  Procedure Laterality Date  . ADENOIDECTOMY    . BACK SURGERY     x2  . COLONOSCOPY  2009  . ESOPHAGOGASTRODUODENOSCOPY    . Heel spurs Bilateral   . KNEE ARTHROPLASTY Left 11/07/2013   Procedure: COMPUTER ASSISTED TOTAL KNEE ARTHROPLASTY;  Surgeon: Marybelle Killings, MD;  Location: [facility];  Service: Orthopedics;  Laterality: Left;  Left Total Knee Arthroplasty, Cemented, Computer Assist  . TONSILLECTOMY    . TOTAL KNEE REVISION Left 09/19/2014   Procedure: LEFT TOTAL KNEE REVISION;  Surgeon: Leandrew Koyanagi, MD;  Location: [facility];  Service: Orthopedics;  Laterality: Left;  . UPPER GASTROINTESTINAL ENDOSCOPY     Social History   Occupational History  . Occupation: Retired  Tobacco Use  . Smoking status: Former Smoker     Packs/day: 0.25    Years: 20.00    Pack years: 5.00    Types: Cigarettes    Last attempt to quit: 09/08/1978    Years since quitting: 39.6  . Smokeless tobacco: Never Used  . Tobacco comment: quit smokikng 32yrs ago  Substance and Sexual Activity  . Alcohol use: No  . Drug use: No  . Sexual activity: Never    Birth control/protection: Post-menopausal

## 2018-04-28 ENCOUNTER — Other Ambulatory Visit (INDEPENDENT_AMBULATORY_CARE_PROVIDER_SITE_OTHER): Payer: Medicare HMO

## 2018-04-28 DIAGNOSIS — M79605 Pain in left leg: Secondary | ICD-10-CM | POA: Diagnosis not present

## 2018-04-28 DIAGNOSIS — E890 Postprocedural hypothyroidism: Secondary | ICD-10-CM | POA: Diagnosis not present

## 2018-04-28 DIAGNOSIS — R5383 Other fatigue: Secondary | ICD-10-CM

## 2018-04-28 LAB — VITAMIN B12: Vitamin B-12: 1177 pg/mL — ABNORMAL HIGH (ref 211–911)

## 2018-04-28 LAB — T4, FREE: Free T4: 0.91 ng/dL (ref 0.60–1.60)

## 2018-04-28 LAB — TSH: TSH: 1.46 u[IU]/mL (ref 0.35–4.50)

## 2018-04-30 DIAGNOSIS — M79605 Pain in left leg: Secondary | ICD-10-CM | POA: Diagnosis not present

## 2018-05-05 DIAGNOSIS — M79605 Pain in left leg: Secondary | ICD-10-CM | POA: Diagnosis not present

## 2018-05-06 ENCOUNTER — Ambulatory Visit (INDEPENDENT_AMBULATORY_CARE_PROVIDER_SITE_OTHER): Payer: Medicare HMO | Admitting: Psychology

## 2018-05-06 DIAGNOSIS — F330 Major depressive disorder, recurrent, mild: Secondary | ICD-10-CM

## 2018-05-06 DIAGNOSIS — F411 Generalized anxiety disorder: Secondary | ICD-10-CM

## 2018-05-06 DIAGNOSIS — R69 Illness, unspecified: Secondary | ICD-10-CM | POA: Diagnosis not present

## 2018-05-07 DIAGNOSIS — M79605 Pain in left leg: Secondary | ICD-10-CM | POA: Diagnosis not present

## 2018-05-12 DIAGNOSIS — M79605 Pain in left leg: Secondary | ICD-10-CM | POA: Diagnosis not present

## 2018-05-12 DIAGNOSIS — J3089 Other allergic rhinitis: Secondary | ICD-10-CM | POA: Diagnosis not present

## 2018-05-12 DIAGNOSIS — J301 Allergic rhinitis due to pollen: Secondary | ICD-10-CM | POA: Diagnosis not present

## 2018-05-12 DIAGNOSIS — J3081 Allergic rhinitis due to animal (cat) (dog) hair and dander: Secondary | ICD-10-CM | POA: Diagnosis not present

## 2018-05-14 DIAGNOSIS — M79605 Pain in left leg: Secondary | ICD-10-CM | POA: Diagnosis not present

## 2018-05-18 DIAGNOSIS — M79605 Pain in left leg: Secondary | ICD-10-CM | POA: Diagnosis not present

## 2018-05-21 DIAGNOSIS — M79605 Pain in left leg: Secondary | ICD-10-CM | POA: Diagnosis not present

## 2018-05-23 ENCOUNTER — Other Ambulatory Visit: Payer: Self-pay | Admitting: Family Medicine

## 2018-05-26 ENCOUNTER — Ambulatory Visit: Payer: Medicare HMO | Admitting: Physician Assistant

## 2018-05-26 ENCOUNTER — Encounter: Payer: Self-pay | Admitting: Physician Assistant

## 2018-05-26 DIAGNOSIS — R69 Illness, unspecified: Secondary | ICD-10-CM | POA: Diagnosis not present

## 2018-05-26 DIAGNOSIS — F331 Major depressive disorder, recurrent, moderate: Secondary | ICD-10-CM

## 2018-05-26 DIAGNOSIS — G4700 Insomnia, unspecified: Secondary | ICD-10-CM

## 2018-05-26 MED ORDER — SERTRALINE HCL 25 MG PO TABS: 25.0000 mg | ORAL_TABLET | Freq: Every day | ORAL | 1 refills | Status: DC

## 2018-05-26 MED ORDER — SERTRALINE HCL 100 MG PO TABS: 100.0000 mg | ORAL_TABLET | Freq: Every day | ORAL | 1 refills | Status: DC

## 2018-05-26 NOTE — Progress Notes (Signed)
[facility] Med Check  Patient ID: Beverly Ballard,  MRN: [id number]  PCP: Lucretia Kern, DO  Date of Evaluation: 05/26/2018 Time spent:15 minutes  Chief Complaint:  Chief Complaint    Follow-up      HISTORY/CURRENT STATUS: HPI For 6 month med check.  Broke her right foot right before Christmas. Wasn't able to drive for awhile.  Got depressed about it some but is better now.  Is back into crafting some and enjoys that.  She wasn't able to go to [facility] every week and was upset about it but her husband has been taking her some which made her feel better.    Overall, patient denies loss of interest in usual activities and is able to enjoy things.  Denies decreased energy or motivation.  Appetite has not changed.  No extreme sadness, tearfulness, or feelings of hopelessness.  Denies any changes in concentration, making decisions or remembering things.  Denies suicidal or homicidal thoughts.  Anxiety is better.  Not needing Ativan.   Individual Medical History/ Review of Systems: Changes? :No    Past medications for mental health diagnoses include: Paxil, Prozac, Valium  Allergies: Lamotrigine; Bupropion; Carbamazepine; Levetiracetam; Tramadol; Adhesive [tape]; Clarithromycin; Doxycycline; Nickel; Other; Estradiol; Latex; and Phenytoin sodium extended  Current Medications:  Current Outpatient Medications:  .  aspirin 81 MG tablet, Take 81 mg by mouth daily., Disp: , Rfl:  .  beta carotene w/minerals (OCUVITE) tablet, Take 1 tablet by mouth daily., Disp: , Rfl:  .  Cholecalciferol (VITAMIN D3 PO), Take by mouth., Disp: , Rfl:  .  Cyanocobalamin (VITAMIN B 12 PO), Take by mouth daily.  , Disp: , Rfl:  .  esomeprazole (NEXIUM) 20 MG capsule, Take 20 mg by mouth daily before breakfast., Disp: , Rfl:  .  fluticasone (FLONASE) 50 MCG/ACT nasal spray, Place 2 sprays into both nostrils daily., Disp: 16 g, Rfl: 6 .  levocetirizine (XYZAL) 5 MG tablet, , Disp: , Rfl:  .  levothyroxine  (SYNTHROID, LEVOTHROID) 88 MCG tablet, TAKE 1 TABLET BY MOUTH EVERY DAY, Disp: 45 tablet, Rfl: 3 .  lisinopril (PRINIVIL,ZESTRIL) 5 MG tablet, TAKE 1 TABLET BY MOUTH EVERY DAY, Disp: 90 tablet, Rfl: 1 .  pravastatin (PRAVACHOL) 20 MG tablet, TAKE 1 TABLET BY MOUTH EVERY DAY, Disp: 90 tablet, Rfl: 1 .  sertraline (ZOLOFT) 100 MG tablet, Take 1 tablet (100 mg total) by mouth daily. (Patient taking differently: Take 125 mg by mouth daily. ), Disp: 90 tablet, Rfl: 1 .  sertraline (ZOLOFT) 25 MG tablet, Take 25 mg by mouth daily., Disp: , Rfl:  .  zonisamide (ZONEGRAN) 100 MG capsule, Take 200 mg by mouth 2 (two) times daily., Disp: , Rfl:  .  albuterol (PROVENTIL HFA;VENTOLIN HFA) 108 (90 Base) MCG/ACT inhaler, Inhale into the lungs., Disp: , Rfl:   Current Facility-Administered Medications:  .  0.9 %  sodium chloride infusion, 500 mL, Intravenous, Continuous, Armbruster, Carlota Raspberry, MD Medication Side Effects: none  Family Medical/ Social History: Changes? No  MENTAL HEALTH EXAM:  There were no vitals taken for this visit.There is no height or weight on file to calculate BMI.  General Appearance: Casual, Well Groomed and Obese  Eye Contact:  Good  Speech:  Clear and Coherent  Volume:  Normal  Mood:  Euthymic  Affect:  Appropriate  Thought Process:  Goal Directed  Orientation:  Full (Time, Place, and Person)  Thought Content: Logical   Suicidal Thoughts:  No  Homicidal Thoughts:  No  Memory:  WNL  Judgement:  Good  Insight:  Good  Psychomotor Activity:  Normal  Concentration:  Concentration: Good  Recall:  Good  Fund of Knowledge: Good  Language: Good  Assets:  Desire for Improvement  ADL's:  Intact  Cognition: WNL  Prognosis:  Good    DIAGNOSES:    ICD-10-CM   1. Major depressive disorder, recurrent episode, moderate (HCC) F33.1   2. Insomnia, unspecified type G47.00     Receiving Psychotherapy: Yes Dr. Glennon Hamilton   RECOMMENDATIONS: We discussed increasing Zoloft but she  prefers to keep dose the same.  Her depression seems to be situational (healing from broken foot) so will keep it the same for now.  Continue Zoloft 125mg  qd. Cont therapy with Dr Glennon Hamilton. Return in 6 months.   Donnal Moat, PA-C

## 2018-06-02 DIAGNOSIS — J301 Allergic rhinitis due to pollen: Secondary | ICD-10-CM | POA: Diagnosis not present

## 2018-06-02 DIAGNOSIS — J3081 Allergic rhinitis due to animal (cat) (dog) hair and dander: Secondary | ICD-10-CM | POA: Diagnosis not present

## 2018-06-02 DIAGNOSIS — J3089 Other allergic rhinitis: Secondary | ICD-10-CM | POA: Diagnosis not present

## 2018-06-04 DIAGNOSIS — J3089 Other allergic rhinitis: Secondary | ICD-10-CM | POA: Diagnosis not present

## 2018-06-04 DIAGNOSIS — J3081 Allergic rhinitis due to animal (cat) (dog) hair and dander: Secondary | ICD-10-CM | POA: Diagnosis not present

## 2018-06-07 DIAGNOSIS — L821 Other seborrheic keratosis: Secondary | ICD-10-CM | POA: Diagnosis not present

## 2018-06-14 ENCOUNTER — Ambulatory Visit (INDEPENDENT_AMBULATORY_CARE_PROVIDER_SITE_OTHER): Payer: Medicare HMO | Admitting: Psychology

## 2018-06-14 DIAGNOSIS — R69 Illness, unspecified: Secondary | ICD-10-CM | POA: Diagnosis not present

## 2018-06-14 DIAGNOSIS — F411 Generalized anxiety disorder: Secondary | ICD-10-CM | POA: Diagnosis not present

## 2018-06-14 DIAGNOSIS — F330 Major depressive disorder, recurrent, mild: Secondary | ICD-10-CM | POA: Diagnosis not present

## 2018-06-17 ENCOUNTER — Ambulatory Visit (INDEPENDENT_AMBULATORY_CARE_PROVIDER_SITE_OTHER): Payer: Medicare HMO | Admitting: Family Medicine

## 2018-06-17 ENCOUNTER — Encounter: Payer: Self-pay | Admitting: Family Medicine

## 2018-06-17 ENCOUNTER — Ambulatory Visit: Payer: Self-pay | Admitting: Family Medicine

## 2018-06-17 VITALS — BP 100/54 | HR 64 | Temp 97.7°F | Ht 64.0 in | Wt 222.4 lb

## 2018-06-17 DIAGNOSIS — E785 Hyperlipidemia, unspecified: Secondary | ICD-10-CM

## 2018-06-17 DIAGNOSIS — R69 Illness, unspecified: Secondary | ICD-10-CM | POA: Diagnosis not present

## 2018-06-17 DIAGNOSIS — J301 Allergic rhinitis due to pollen: Secondary | ICD-10-CM | POA: Diagnosis not present

## 2018-06-17 DIAGNOSIS — E559 Vitamin D deficiency, unspecified: Secondary | ICD-10-CM

## 2018-06-17 DIAGNOSIS — I700 Atherosclerosis of aorta: Secondary | ICD-10-CM

## 2018-06-17 DIAGNOSIS — E890 Postprocedural hypothyroidism: Secondary | ICD-10-CM | POA: Diagnosis not present

## 2018-06-17 DIAGNOSIS — J3089 Other allergic rhinitis: Secondary | ICD-10-CM | POA: Diagnosis not present

## 2018-06-17 DIAGNOSIS — I10 Essential (primary) hypertension: Secondary | ICD-10-CM | POA: Diagnosis not present

## 2018-06-17 DIAGNOSIS — J3081 Allergic rhinitis due to animal (cat) (dog) hair and dander: Secondary | ICD-10-CM | POA: Diagnosis not present

## 2018-06-17 DIAGNOSIS — F3341 Major depressive disorder, recurrent, in partial remission: Secondary | ICD-10-CM

## 2018-06-17 NOTE — Progress Notes (Signed)
HPI:  Using dictation device. Unfortunately this device frequently misinterprets words/phrases.  Beverly Ballard is a pleasant 73 y.o. here for follow up. Chronic medical problems summarized below were reviewed for changes and stability and were updated as needed below. These issues and their treatment remain stable for the most part.  Reports is ding well. Mood is better then it has been in some time. Reports she is doing crafts and getting out more and volunteering. Continues to see Dr. Glennon Hamilton for counseling. Denies CP, SOB, DOE, treatment intolerance or new symptoms. Due for Vit D recheck, but taking sporadically.  Anxiety and Depression, chronic fatigue: -seeing counselor for CBT, Donnal Moat, PSychiatry -meds: zoloft 125mg   HTN/Aortic atherosclorosis/Obesity/Hyperlipidemia: -meds: asa, lisinopril, pravastatin  Dyspnea: -seeing pulmonology, Dr. Gala Murdoch -had eval in 2019 with PFT, imaging - no sig findings -felt to be multifactorial from allergies, deconditioning, ? Sleep apnea -sleep study pending -uses alb prn  Hypothyroidism: -meds: levothyroxine -sees Dr. Cruzita Lederer  GERD: -meds: nexium  Allergic rhinitis: -meds: xyzal, flonase  Ovarian cyst/Hx abnormal pap/Osteopenia/low vit D: -followed by gyn  Chronic back pain/OA: -seeing ortho  ROS: See pertinent positives and negatives per HPI.  Past Medical History:  Diagnosis Date  . Allergy   . Anxiety and depression 12/19/2009  . Arthritis   . Cancer (HCC)    vaginal  . Constipation   . Degenerative disorder of eye   . Diverticulosis   . Essential hypertension, benign 06/26/2009  . GERD 05/27/2007   takes Nexium daily  . Heart murmur    yrs ago  . History of gout   . History of migraine many yrs ago  . Hyperlipidemia    takes Pravastatin daily  . Hypothyroidism   . Insomnia    but doesn't take any meds  . LOW BACK PAIN 05/27/2007  . Numbness    left toes   . Osteoporosis   . Seizures ([place])    last 1983,  none since then- last seizure 1988 per pt     Past Surgical History:  Procedure Laterality Date  . ADENOIDECTOMY    . BACK SURGERY     x2  . COLONOSCOPY  2009  . ESOPHAGOGASTRODUODENOSCOPY    . Heel spurs Bilateral   . KNEE ARTHROPLASTY Left 11/07/2013   Procedure: COMPUTER ASSISTED TOTAL KNEE ARTHROPLASTY;  Surgeon: Marybelle Killings, MD;  Location: [facility];  Service: Orthopedics;  Laterality: Left;  Left Total Knee Arthroplasty, Cemented, Computer Assist  . TONSILLECTOMY    . TOTAL KNEE REVISION Left 09/19/2014   Procedure: LEFT TOTAL KNEE REVISION;  Surgeon: Leandrew Koyanagi, MD;  Location: [facility];  Service: Orthopedics;  Laterality: Left;  . UPPER GASTROINTESTINAL ENDOSCOPY      Family History  Problem Relation Age of Onset  . Arthritis Mother   . Stroke Mother   . Heart murmur Other   . Colon cancer Neg Hx   . Esophageal cancer Neg Hx   . Stomach cancer Neg Hx   . Rectal cancer Neg Hx   . Colon polyps Neg Hx     SOCIAL HX: see hpi   Current Outpatient Medications:  .  albuterol (PROVENTIL HFA;VENTOLIN HFA) 108 (90 Base) MCG/ACT inhaler, Inhale into the lungs., Disp: , Rfl:  .  aspirin 81 MG tablet, Take 81 mg by mouth daily., Disp: , Rfl:  .  beta carotene w/minerals (OCUVITE) tablet, Take 1 tablet by mouth daily., Disp: , Rfl:  .  Cholecalciferol (VITAMIN D3 PO), Take by mouth.,  Disp: , Rfl:  .  Cyanocobalamin (VITAMIN B 12 PO), Take by mouth daily.  , Disp: , Rfl:  .  esomeprazole (NEXIUM) 20 MG capsule, Take 20 mg by mouth daily before breakfast., Disp: , Rfl:  .  fluticasone (FLONASE) 50 MCG/ACT nasal spray, Place 2 sprays into both nostrils daily., Disp: 16 g, Rfl: 6 .  levocetirizine (XYZAL) 5 MG tablet, , Disp: , Rfl:  .  levothyroxine (SYNTHROID, LEVOTHROID) 88 MCG tablet, TAKE 1 TABLET BY MOUTH EVERY DAY, Disp: 45 tablet, Rfl: 3 .  lisinopril (PRINIVIL,ZESTRIL) 5 MG tablet, TAKE 1 TABLET BY MOUTH EVERY DAY, Disp: 90 tablet, Rfl: 1 .  pravastatin (PRAVACHOL) 20 MG tablet,  TAKE 1 TABLET BY MOUTH EVERY DAY, Disp: 90 tablet, Rfl: 1 .  sertraline (ZOLOFT) 100 MG tablet, Take 1 tablet (100 mg total) by mouth daily. Take with 25mg =125mg , Disp: 90 tablet, Rfl: 1 .  sertraline (ZOLOFT) 25 MG tablet, Take 1 tablet (25 mg total) by mouth daily. Take with 100mg =125mg ., Disp: 90 tablet, Rfl: 1 .  zonisamide (ZONEGRAN) 100 MG capsule, Take 200 mg by mouth 2 (two) times daily., Disp: , Rfl:   Current Facility-Administered Medications:  .  0.9 %  sodium chloride infusion, 500 mL, Intravenous, Continuous, Armbruster, Carlota Raspberry, MD  EXAM:  Vitals:   06/17/18 1332  BP: (!) 100/54  Pulse: 64  Temp: 97.7 F (36.5 C)    Body mass index is 38.17 kg/m.  GENERAL: vitals reviewed and listed above, alert, oriented, appears well hydrated and in no acute distress  HEENT: atraumatic, conjunttiva clear, no obvious abnormalities on inspection of external nose and ears  NECK: no obvious masses on inspection  LUNGS: clear to auscultation bilaterally, no wheezes, rales or rhonchi, good air movement  CV: HRRR, no peripheral edema  MS: moves all extremities without noticeable abnormality  PSYCH: pleasant and cooperative, no obvious depression or anxiety  ASSESSMENT AND PLAN:  Discussed the following assessment and plan:  Hyperlipidemia, unspecified hyperlipidemia type  Essential hypertension Aortic atherosclerosis (HCC) Morbid obesity (HCC) -lifestyle recs, sumarized below -cont current treatments  Postablative hypothyroidism -labs done recently good, cont current tx  Recurrent major depressive disorder, in partial remission ([place]) -better then usual, congratulated her on getting out and regular activities and cont with cbt and psychiatry  Vitamin D deficiency -advised need to take regular careful dose of vit D3 - if source naturals 4-5 drops daily; recheck next visit  -Patient advised to return or notify a doctor immediately if symptoms worsen or persist or new  concerns arise.  Patient Instructions  BEFORE YOU LEAVE: -follow up: 3 months  Vit D3 [phone] IU daily (please read instructions on the bottle and take properly)   We recommend the following healthy lifestyle for LIFE: 1) Small portions. But, make sure to get regular (at least 3 per day), healthy meals and small healthy snacks if needed.  2) Eat a healthy clean diet.   TRY TO EAT: -at least 5-7 servings of low sugar, colorful, and nutrient rich vegetables per day (not corn, potatoes or bananas.) -berries are the best choice if you wish to eat fruit (only eat small amounts if trying to reduce weight)  -lean meets (fish, white meat of chicken or [place]) -vegan proteins for some meals - beans or tofu, whole grains, nuts and seeds -Replace bad fats with good fats - good fats include: fish, nuts and seeds, canola oil, olive oil -small amounts of low fat or non fat dairy -small  amounts of100 % whole grains - check the lables -drink plenty of water  AVOID: -SUGAR, sweets, anything with added sugar, corn syrup or sweeteners - must read labels as even foods advertised as "healthy" often are loaded with sugar -if you must have a sweetener, small amounts of stevia may be best -sweetened beverages and artificially sweetened beverages -simple starches (rice, bread, potatoes, pasta, chips, etc - small amounts of 100% whole grains are ok) -red meat, pork, butter -fried foods, fast food, processed food, excessive dairy, eggs and coconut.  3)Get at least 150 minutes of sweaty aerobic exercise per week.  4)Reduce stress - consider counseling, meditation and relaxation to balance other aspects of your life.     Lucretia Kern, DO

## 2018-06-17 NOTE — Patient Instructions (Addendum)
BEFORE YOU LEAVE: -follow up: 3 months  Vit D3 [phone] IU daily (please read instructions on the bottle and take properly)   We recommend the following healthy lifestyle for LIFE: 1) Small portions. But, make sure to get regular (at least 3 per day), healthy meals and small healthy snacks if needed.  2) Eat a healthy clean diet.   TRY TO EAT: -at least 5-7 servings of low sugar, colorful, and nutrient rich vegetables per day (not corn, potatoes or bananas.) -berries are the best choice if you wish to eat fruit (only eat small amounts if trying to reduce weight)  -lean meets (fish, white meat of chicken or [place]) -vegan proteins for some meals - beans or tofu, whole grains, nuts and seeds -Replace bad fats with good fats - good fats include: fish, nuts and seeds, canola oil, olive oil -small amounts of low fat or non fat dairy -small amounts of100 % whole grains - check the lables -drink plenty of water  AVOID: -SUGAR, sweets, anything with added sugar, corn syrup or sweeteners - must read labels as even foods advertised as "healthy" often are loaded with sugar -if you must have a sweetener, small amounts of stevia may be best -sweetened beverages and artificially sweetened beverages -simple starches (rice, bread, potatoes, pasta, chips, etc - small amounts of 100% whole grains are ok) -red meat, pork, butter -fried foods, fast food, processed food, excessive dairy, eggs and coconut.  3)Get at least 150 minutes of sweaty aerobic exercise per week.  4)Reduce stress - consider counseling, meditation and relaxation to balance other aspects of your life.

## 2018-06-29 DIAGNOSIS — J3081 Allergic rhinitis due to animal (cat) (dog) hair and dander: Secondary | ICD-10-CM | POA: Diagnosis not present

## 2018-06-29 DIAGNOSIS — J3089 Other allergic rhinitis: Secondary | ICD-10-CM | POA: Diagnosis not present

## 2018-06-29 DIAGNOSIS — J301 Allergic rhinitis due to pollen: Secondary | ICD-10-CM | POA: Diagnosis not present

## 2018-07-02 DIAGNOSIS — J301 Allergic rhinitis due to pollen: Secondary | ICD-10-CM | POA: Diagnosis not present

## 2018-07-02 DIAGNOSIS — J3089 Other allergic rhinitis: Secondary | ICD-10-CM | POA: Diagnosis not present

## 2018-07-02 DIAGNOSIS — J3081 Allergic rhinitis due to animal (cat) (dog) hair and dander: Secondary | ICD-10-CM | POA: Diagnosis not present

## 2018-07-05 DIAGNOSIS — J3081 Allergic rhinitis due to animal (cat) (dog) hair and dander: Secondary | ICD-10-CM | POA: Diagnosis not present

## 2018-07-05 DIAGNOSIS — J3089 Other allergic rhinitis: Secondary | ICD-10-CM | POA: Diagnosis not present

## 2018-07-05 DIAGNOSIS — J301 Allergic rhinitis due to pollen: Secondary | ICD-10-CM | POA: Diagnosis not present

## 2018-07-06 ENCOUNTER — Ambulatory Visit (INDEPENDENT_AMBULATORY_CARE_PROVIDER_SITE_OTHER): Payer: Self-pay | Admitting: Orthopaedic Surgery

## 2018-07-06 ENCOUNTER — Ambulatory Visit: Payer: Medicare HMO | Admitting: Psychology

## 2018-07-07 ENCOUNTER — Ambulatory Visit (INDEPENDENT_AMBULATORY_CARE_PROVIDER_SITE_OTHER): Payer: Medicare HMO

## 2018-07-07 ENCOUNTER — Ambulatory Visit (INDEPENDENT_AMBULATORY_CARE_PROVIDER_SITE_OTHER): Payer: Medicare HMO | Admitting: Orthopaedic Surgery

## 2018-07-07 ENCOUNTER — Other Ambulatory Visit: Payer: Self-pay

## 2018-07-07 ENCOUNTER — Encounter (INDEPENDENT_AMBULATORY_CARE_PROVIDER_SITE_OTHER): Payer: Self-pay | Admitting: Orthopaedic Surgery

## 2018-07-07 DIAGNOSIS — M25561 Pain in right knee: Secondary | ICD-10-CM

## 2018-07-07 DIAGNOSIS — M25562 Pain in left knee: Secondary | ICD-10-CM | POA: Diagnosis not present

## 2018-07-07 DIAGNOSIS — G8929 Other chronic pain: Secondary | ICD-10-CM

## 2018-07-07 DIAGNOSIS — J3081 Allergic rhinitis due to animal (cat) (dog) hair and dander: Secondary | ICD-10-CM | POA: Diagnosis not present

## 2018-07-07 DIAGNOSIS — J301 Allergic rhinitis due to pollen: Secondary | ICD-10-CM | POA: Diagnosis not present

## 2018-07-07 DIAGNOSIS — M25512 Pain in left shoulder: Secondary | ICD-10-CM | POA: Diagnosis not present

## 2018-07-07 DIAGNOSIS — J3089 Other allergic rhinitis: Secondary | ICD-10-CM | POA: Diagnosis not present

## 2018-07-07 MED ORDER — METHYLPREDNISOLONE ACETATE 40 MG/ML IJ SUSP: 40.0000 mg | Freq: Once | INTRAMUSCULAR | Status: DC

## 2018-07-07 NOTE — Progress Notes (Signed)
Subjective: She is here with chronic left shoulder pain, for ultrasound-guided glenohumeral injection.  Procedure: Ultrasound-guided left glenohumeral injection: After sterile prep with Betadine, injected 8 cc 1% lidocaine without epinephrine and 40 mg methylprednisolone using a 22-gauge spinal needle, passing the needle through approach into the glenohumeral joint.  Injectate was seen filling the joint capsule.  She had moderate improvement in pain during the immediate anesthetic phase.  She will follow-up as directed.

## 2018-07-07 NOTE — Progress Notes (Signed)
Office Visit Note   Patient: Beverly Ballard           Date of Birth: 02/10/1946           MRN: [id number] Visit Date: 07/07/2018              Requested by: Lucretia Kern, DO [address] [place],  [address] PCP: Lucretia Kern, DO   Assessment & Plan: Visit Diagnoses:  1. Chronic left shoulder pain   2. Chronic pain of both knees     Plan: In terms of her shoulder I am getting the sense that she may have arthritis exacerbation possibly some bursitis or rotator cuff appears to be intact.  I would like to start with a glenohumeral injection first to see what kind of relief she gets from this.  In terms of her knees after extensive discussion it sounds like she mainly just wants to get it checked out and she is not really having that much pain. I get the sense and after discussion with her that her anxiety depression has really made her overall pain throughout her body much worse.  She is already on Zoloft for anxiety and depression therefore I will not prescribe her Cymbalta to help with her pain but I did recommend that she discuss with her psychologist or psychiatrist about possibly titrating her medicines to help with her pain.  She agrees to this plan.  She tolerated the glenohumeral injection under ultrasound with Dr. Junius Roads well today.  Follow-up with me as needed.  Follow-Up Instructions: Return if symptoms worsen or fail to improve.   Orders:  Orders Placed This Encounter  Procedures  . XR Shoulder Left  . XR KNEE 3 VIEW RIGHT  . XR KNEE 3 VIEW LEFT   No orders of the defined types were placed in this encounter.     Procedures: No procedures performed   Clinical Data: No additional findings.   Subjective: Chief Complaint  Patient presents with  . Left Shoulder - Pain  . Left Knee - Pain  . Right Knee - Pain    Prim comes in today for evaluation of her left shoulder and bilateral knee pain that she states she developed after she fell in December.   Upon further questioning it sounds like she mainly wants to be reassured that there are no structural problems in her shoulder or knee.  She does endorse chronic pain throughout her body that is severely affecting her mentally.  I performed her left total knee revision a couple years ago for nickel allergy.   Review of Systems  Constitutional: Negative.   HENT: Negative.   Eyes: Negative.   Respiratory: Negative.   Cardiovascular: Negative.   Endocrine: Negative.   Musculoskeletal: Negative.   Neurological: Negative.   Hematological: Negative.   Psychiatric/Behavioral: Negative.   All other systems reviewed and are negative.    Objective: Vital Signs: There were no vitals taken for this visit.  Physical Exam Vitals signs and nursing note reviewed.  Constitutional:      Appearance: She is well-developed.  Pulmonary:     Effort: Pulmonary effort is normal.  Skin:    General: Skin is warm.     Capillary Refill: Capillary refill takes less than 2 seconds.  Neurological:     Mental Status: She is alert and oriented to person, place, and time.  Psychiatric:        Behavior: Behavior normal.  Thought Content: Thought content normal.        Judgment: Judgment normal.     Ortho Exam Left shoulder exam shows normal passive range of motion with mild pain at the extremes.  Active range of motion is slightly limited due to discomfort.  She has pain with Hawkins sign.  Negative Neer impingement.  Rotator cuff is grossly intact to manual muscle testing.  Negative cross body adduction.  Bilateral knee exam shows no joint effusion.  She is very jumpy with any light palpation consistent with allodynia.  Collaterals and cruciates are stable.  Specialty Comments:  No specialty comments available.  Imaging: Xr Knee 3 View Left  Result Date: 07/07/2018 Stable left total knee replacement without complication.  Xr Knee 3 View Right  Result Date: 07/07/2018 Mild to moderate  osteoarthritis.  No acute abnormalities  Xr Shoulder Left  Result Date: 07/07/2018 No acute or structural abnormalities    PMFS History: Patient Active Problem List   Diagnosis Date Noted  . Fatigue 03/23/2018  . Recurrent major depressive disorder, in partial remission ([place]) 05/04/2017  . Left knee pain 01/12/2017  . Ovarian cyst 10/30/2016  . Aortic atherosclerosis ([place]) 11/19/2015  . Hyperlipemia 04/03/2015  . S/P revision of total knee 09/19/2014  . Seizure disorder ([facility]) 01/24/2014  . Obesity, BMI unknown 01/24/2014  . Osteopenia, stable on dexa 2010 - followed by gyn 06/15/2012  . Pap smear abnormality of cervix/human papillomavirus (HPV) positive - 09/2011, followed by gyn 06/15/2012  . Essential hypertension 07/03/2009  . Hypothyroidism 05/27/2007  . Allergic rhinitis 05/27/2007  . GERD 05/27/2007   Past Medical History:  Diagnosis Date  . Allergy   . Anxiety and depression 12/19/2009  . Arthritis   . Cancer (HCC)    vaginal  . Constipation   . Degenerative disorder of eye   . Diverticulosis   . Essential hypertension, benign 06/26/2009  . GERD 05/27/2007   takes Nexium daily  . Heart murmur    yrs ago  . History of gout   . History of migraine many yrs ago  . Hyperlipidemia    takes Pravastatin daily  . Hypothyroidism   . Insomnia    but doesn't take any meds  . LOW BACK PAIN 05/27/2007  . Numbness    left toes   . Osteoporosis   . Seizures ([place])    last 1983, none since then- last seizure 1988 per pt     Family History  Problem Relation Age of Onset  . Arthritis Mother   . Stroke Mother   . Heart murmur Other   . Colon cancer Neg Hx   . Esophageal cancer Neg Hx   . Stomach cancer Neg Hx   . Rectal cancer Neg Hx   . Colon polyps Neg Hx     Past Surgical History:  Procedure Laterality Date  . ADENOIDECTOMY    . BACK SURGERY     x2  . COLONOSCOPY  2009  . ESOPHAGOGASTRODUODENOSCOPY    . Heel spurs Bilateral   . KNEE ARTHROPLASTY Left 11/07/2013    Procedure: COMPUTER ASSISTED TOTAL KNEE ARTHROPLASTY;  Surgeon: Marybelle Killings, MD;  Location: [facility];  Service: Orthopedics;  Laterality: Left;  Left Total Knee Arthroplasty, Cemented, Computer Assist  . TONSILLECTOMY    . TOTAL KNEE REVISION Left 09/19/2014   Procedure: LEFT TOTAL KNEE REVISION;  Surgeon: Leandrew Koyanagi, MD;  Location: [facility];  Service: Orthopedics;  Laterality: Left;  . UPPER GASTROINTESTINAL ENDOSCOPY  Social History   Occupational History  . Occupation: Retired  Tobacco Use  . Smoking status: Former Smoker    Packs/day: 0.25    Years: 20.00    Pack years: 5.00    Types: Cigarettes    Last attempt to quit: 09/08/1978    Years since quitting: 39.8  . Smokeless tobacco: Never Used  . Tobacco comment: quit smokikng 90yrs ago  Substance and Sexual Activity  . Alcohol use: No  . Drug use: No  . Sexual activity: Never    Birth control/protection: Post-menopausal

## 2018-07-20 DIAGNOSIS — J3089 Other allergic rhinitis: Secondary | ICD-10-CM | POA: Diagnosis not present

## 2018-07-20 DIAGNOSIS — J3081 Allergic rhinitis due to animal (cat) (dog) hair and dander: Secondary | ICD-10-CM | POA: Diagnosis not present

## 2018-07-20 DIAGNOSIS — J301 Allergic rhinitis due to pollen: Secondary | ICD-10-CM | POA: Diagnosis not present

## 2018-07-21 ENCOUNTER — Telehealth: Payer: Self-pay | Admitting: *Deleted

## 2018-07-21 NOTE — Telephone Encounter (Signed)
I left a message for the pt to return my call. 

## 2018-07-21 NOTE — Telephone Encounter (Signed)
-----   Message from Venetia Constable sent at 07/20/2018  9:09 AM EDT ----- Regarding: Webex needed Good Morning Joann!  I scheduled patient for an appt on Monday, 07/26/18.  Webex is needed for this appt.  I requested verification on phone number and email address, however patient has not returned that yet.  Thanks!  Tammy

## 2018-07-26 ENCOUNTER — Ambulatory Visit (INDEPENDENT_AMBULATORY_CARE_PROVIDER_SITE_OTHER): Payer: Medicare HMO | Admitting: Family Medicine

## 2018-07-26 ENCOUNTER — Telehealth: Payer: Self-pay | Admitting: *Deleted

## 2018-07-26 ENCOUNTER — Other Ambulatory Visit: Payer: Self-pay

## 2018-07-26 DIAGNOSIS — Z5329 Procedure and treatment not carried out because of patient's decision for other reasons: Secondary | ICD-10-CM

## 2018-07-26 DIAGNOSIS — Z91199 Patient's noncompliance with other medical treatment and regimen due to unspecified reason: Secondary | ICD-10-CM

## 2018-07-26 NOTE — Telephone Encounter (Signed)
I left a message for the pt to return my call. 

## 2018-07-26 NOTE — Progress Notes (Signed)
Visit not completed - rescheduled

## 2018-07-26 NOTE — Telephone Encounter (Signed)
-----   Message from Lucretia Kern, DO sent at 07/26/2018 11:21 AM EDT ----- Rometta Emery,  I think Allexa tried to connect via [email or web address] a little while ago. However, I did not know she was attempting to connect and was with another patient. Just tried again and she was no longer there. Can you please call her and let her know we apologize that we kept missing her. I tried calling and the webex at her appointment time. Can you set her up a doxy.me visit with me at 4:30 tomorrow? I can do that without having you load it. Thank you!

## 2018-07-27 ENCOUNTER — Telehealth (INDEPENDENT_AMBULATORY_CARE_PROVIDER_SITE_OTHER): Payer: Medicare HMO | Admitting: Family Medicine

## 2018-07-27 ENCOUNTER — Ambulatory Visit: Payer: Medicare HMO | Admitting: Family Medicine

## 2018-07-27 ENCOUNTER — Encounter (INDEPENDENT_AMBULATORY_CARE_PROVIDER_SITE_OTHER): Payer: Self-pay | Admitting: Orthopaedic Surgery

## 2018-07-27 DIAGNOSIS — I10 Essential (primary) hypertension: Secondary | ICD-10-CM

## 2018-07-27 DIAGNOSIS — M25512 Pain in left shoulder: Secondary | ICD-10-CM | POA: Diagnosis not present

## 2018-07-27 DIAGNOSIS — G40909 Epilepsy, unspecified, not intractable, without status epilepticus: Secondary | ICD-10-CM

## 2018-07-27 DIAGNOSIS — G40209 Localization-related (focal) (partial) symptomatic epilepsy and epileptic syndromes with complex partial seizures, not intractable, without status epilepticus: Secondary | ICD-10-CM | POA: Diagnosis not present

## 2018-07-27 DIAGNOSIS — E890 Postprocedural hypothyroidism: Secondary | ICD-10-CM

## 2018-07-27 DIAGNOSIS — G43009 Migraine without aura, not intractable, without status migrainosus: Secondary | ICD-10-CM | POA: Diagnosis not present

## 2018-07-27 DIAGNOSIS — E559 Vitamin D deficiency, unspecified: Secondary | ICD-10-CM

## 2018-07-27 DIAGNOSIS — R69 Illness, unspecified: Secondary | ICD-10-CM | POA: Diagnosis not present

## 2018-07-27 DIAGNOSIS — F3341 Major depressive disorder, recurrent, in partial remission: Secondary | ICD-10-CM | POA: Diagnosis not present

## 2018-07-27 DIAGNOSIS — G8929 Other chronic pain: Secondary | ICD-10-CM

## 2018-07-27 NOTE — Patient Instructions (Signed)
Please monitor your blood pressure once daily.  If running over 130/80 please increase your lisinopril to 10mg  daily.  Please follow up with me in 2-3 weeks.  Recommend a visit with Dr. Ethlyn Gallery in the next 2-3 months to transfer care.

## 2018-07-27 NOTE — Progress Notes (Signed)
Virtual Visit via Video Note  I connected with Beverly Ballard on 07/27/18 at  by a video enabled telemedicine application and verified that I am speaking with the correct person using two identifiers.  Location patient: home Location provider:work or home office Persons participating in the virtual visit: patient, provider  I discussed the limitations of evaluation and management by telemedicine and the availability of in person appointments. The patient expressed understanding and agreed to proceed.   HPI:  Beverly Ballard is a pleasant 73 y.o. here for follow up. Chronic medical problems summarized below were reviewed for changes and stability and were updated as needed below. These issues and their treatment remain stable for the most part.  Staying home in light of the COVID 19 pandemic. Doing a lot of crafting. She reports has been doing ok. Still sees psychiatry and her counselor.She has some L neck/shoulder pain and is seeing ortho for this. Reports shot in the neck helped for a few days, but then pain returned. Has follow up with her ortho doctor later this week.  Denies CP, SOB, DOE, treatment intolerance or new symptoms. Taking vit D3 drops.    Anxiety and Depression, chronic fatigue: -seeing counselor for CBT, Dr. Glennon Hamilton, PSychiatry - Dr. Adelene Idler for medication -meds: zoloft 125mg   HTN/Aortic atherosclorosis/Obesity/Hyperlipidemia: -meds: asa, lisinopril, pravastatin -BP: home levels  Dyspnea: -has resolved -seeing pulmonology, Dr. Gala Murdoch -had eval in 2019 with PFT, imaging - no sig findings -felt to be multifactorial from allergies, deconditioning, ? Sleep apnea -still has not done the sleep study, reports has not been contacted -uses alb prn  Hypothyroidism: -meds: levothyroxine -sees Dr. Cruzita Lederer  GERD: -meds: nexium  Allergic rhinitis: -meds: xyzal, flonase  Ovarian cyst/Hx abnormal pap/Osteopenia/low vit D: -followed by gyn  Chronic back pain/OA: -seeing  ortho   ROS: See pertinent positives and negatives per HPI.  Past Medical History:  Diagnosis Date  . Allergy   . Anxiety and depression 12/19/2009  . Arthritis   . Cancer (HCC)    vaginal  . Constipation   . Degenerative disorder of eye   . Diverticulosis   . Essential hypertension, benign 06/26/2009  . GERD 05/27/2007   takes Nexium daily  . Heart murmur    yrs ago  . History of gout   . History of migraine many yrs ago  . Hyperlipidemia    takes Pravastatin daily  . Hypothyroidism   . Insomnia    but doesn't take any meds  . LOW BACK PAIN 05/27/2007  . Numbness    left toes   . Osteoporosis   . Seizures ([place])    last 1983, none since then- last seizure 1988 per pt     Past Surgical History:  Procedure Laterality Date  . ADENOIDECTOMY    . BACK SURGERY     x2  . COLONOSCOPY  2009  . ESOPHAGOGASTRODUODENOSCOPY    . Heel spurs Bilateral   . KNEE ARTHROPLASTY Left 11/07/2013   Procedure: COMPUTER ASSISTED TOTAL KNEE ARTHROPLASTY;  Surgeon: Marybelle Killings, MD;  Location: [facility];  Service: Orthopedics;  Laterality: Left;  Left Total Knee Arthroplasty, Cemented, Computer Assist  . TONSILLECTOMY    . TOTAL KNEE REVISION Left 09/19/2014   Procedure: LEFT TOTAL KNEE REVISION;  Surgeon: Leandrew Koyanagi, MD;  Location: [facility];  Service: Orthopedics;  Laterality: Left;  . UPPER GASTROINTESTINAL ENDOSCOPY      Family History  Problem Relation Age of Onset  . Arthritis Mother   . Stroke Mother   .  Heart murmur Other   . Colon cancer Neg Hx   . Esophageal cancer Neg Hx   . Stomach cancer Neg Hx   . Rectal cancer Neg Hx   . Colon polyps Neg Hx     SOCIAL HX: see hpi   Current Outpatient Medications:  .  albuterol (PROVENTIL HFA;VENTOLIN HFA) 108 (90 Base) MCG/ACT inhaler, Inhale into the lungs., Disp: , Rfl:  .  aspirin 81 MG tablet, Take 81 mg by mouth daily., Disp: , Rfl:  .  beta carotene w/minerals (OCUVITE) tablet, Take 1 tablet by mouth daily., Disp: , Rfl:  .   Cholecalciferol (VITAMIN D3 PO), Take by mouth., Disp: , Rfl:  .  Cyanocobalamin (VITAMIN B 12 PO), Take by mouth daily.  , Disp: , Rfl:  .  esomeprazole (NEXIUM) 20 MG capsule, Take 20 mg by mouth daily before breakfast., Disp: , Rfl:  .  fluticasone (FLONASE) 50 MCG/ACT nasal spray, Place 2 sprays into both nostrils daily., Disp: 16 g, Rfl: 6 .  levocetirizine (XYZAL) 5 MG tablet, , Disp: , Rfl:  .  levothyroxine (SYNTHROID, LEVOTHROID) 88 MCG tablet, TAKE 1 TABLET BY MOUTH EVERY DAY, Disp: 45 tablet, Rfl: 3 .  lisinopril (PRINIVIL,ZESTRIL) 5 MG tablet, TAKE 1 TABLET BY MOUTH EVERY DAY, Disp: 90 tablet, Rfl: 1 .  pravastatin (PRAVACHOL) 20 MG tablet, TAKE 1 TABLET BY MOUTH EVERY DAY, Disp: 90 tablet, Rfl: 1 .  sertraline (ZOLOFT) 100 MG tablet, Take 1 tablet (100 mg total) by mouth daily. Take with 25mg =125mg , Disp: 90 tablet, Rfl: 1 .  sertraline (ZOLOFT) 25 MG tablet, Take 1 tablet (25 mg total) by mouth daily. Take with 100mg =125mg ., Disp: 90 tablet, Rfl: 1 .  zonisamide (ZONEGRAN) 100 MG capsule, Take 200 mg by mouth 2 (two) times daily., Disp: , Rfl:   Current Facility-Administered Medications:  .  0.9 %  sodium chloride infusion, 500 mL, Intravenous, Continuous, Armbruster, Carlota Raspberry, MD .  methylPREDNISolone acetate (DEPO-MEDROL) injection 40 mg, 40 mg, Intra-articular, Once, Hilts, Michael, MD  EXAM:  VITALS per patient if applicable: BP 756/43  GENERAL: alert, oriented, appears well and in no acute distress  HEENT: atraumatic, conjunttiva clear, no obvious abnormalities on inspection of external nose and ears  NECK: normal movements of the head and neck  LUNGS: on inspection no signs of respiratory distress, breathing rate appears normal, no obvious gross SOB, gasping or wheezing  CV: no obvious cyanosis  MS: moves all visible extremities without noticeable abnormality  PSYCH/NEURO: pleasant and cooperative, no obvious depression or anxiety, speech and thought processing  grossly intact  ASSESSMENT AND PLAN:  Discussed the following assessment and plan:  Essential hypertension  Vitamin D deficiency  Chronic left shoulder pain  Seizure disorder (HCC)  Postablative hypothyroidism  Recurrent major depressive disorder, in partial remission (HCC)  Stable for the most part, though BP running a little high. She reports this is unusual and prefers to monitor for a few days. If running high plans to increase lisinopril to 10mg . Follow up in 2 weeks.  Seeing ortho for shoulder and I do hope she can get some relief.  Discussed social distancing and ways to exercise within the Stay at home orders during the [place] pandemic.  Advised continued counseling.  Opted to avoid/postpone labs for the time being given the COVID 19 pandemic.   I discussed the assessment and treatment plan with the patient. The patient was provided an opportunity to ask questions and all were answered. The patient agreed  with the plan and demonstrated an understanding of the instructions.   The patient was advised to call back or seek an in-person evaluation if the symptoms worsen or if the condition fails to improve as anticipated.   Follow up instructions: Advised assistant Wendie Simmer to help patient arrange the following: -follow up with Dr. Maudie Mercury in 2 weeks about BP -check on status of sleep study -TOC with Dr. Ethlyn Gallery in next 2-3 months      Lucretia Kern, DO

## 2018-07-28 ENCOUNTER — Telehealth: Payer: Self-pay | Admitting: *Deleted

## 2018-07-28 ENCOUNTER — Other Ambulatory Visit (INDEPENDENT_AMBULATORY_CARE_PROVIDER_SITE_OTHER): Payer: Self-pay

## 2018-07-28 ENCOUNTER — Telehealth (INDEPENDENT_AMBULATORY_CARE_PROVIDER_SITE_OTHER): Payer: Self-pay

## 2018-07-28 MED ORDER — MELOXICAM 7.5 MG PO TABS: 7.5000 mg | ORAL_TABLET | Freq: Two times a day (BID) | ORAL | 0 refills | Status: DC

## 2018-07-28 MED ORDER — METHOCARBAMOL 500 MG PO TABS: 500.0000 mg | ORAL_TABLET | Freq: Four times a day (QID) | ORAL | 0 refills | Status: DC

## 2018-07-28 NOTE — Telephone Encounter (Signed)
Appt scheduled on 4/7.

## 2018-07-28 NOTE — Telephone Encounter (Signed)
Called patient. No answer. LMOM to return call. If she calls back please ask the screening questions. Thank you.  Do you have now or have you had in the past 7 days a fever and/or chills?   Do you have now or have you had in the past 7 days a cough?   Do you have now or have you had in the last 7 days nausea, vomiting or abdominal pain?   Have you been exposed to anyone who has tested positive for COVID-19?   Have you or anyone who lives with you traveled within the last month? 

## 2018-07-28 NOTE — Telephone Encounter (Signed)
I called the pt and scheduled an appt for 4/21 and doxy me was added to the appt notes.

## 2018-07-28 NOTE — Telephone Encounter (Signed)
Please refill the mobic and robaxin.  Thanks.

## 2018-07-28 NOTE — Telephone Encounter (Signed)
-----   Message from Lucretia Kern, DO sent at 07/27/2018  5:31 PM EDT ----- I spoke with Manmeet and did doxy visit. See note in chart for her follow up - created a telemedicine note as was not able to arrive her. Needs follow up doxy.me visit with me in 2 weeks about her blood pressure. Thanks!  Yes you can use the visits on Thursday though if needed for other patients! I am not sure why they are still blocked! Thanks! ----- Message ----- From: Agnes Lawrence, CMA Sent: 07/27/2018   3:52 PM EDT To: Lucretia Kern, DO, Raeanne Gathers  I just spoke with Mrs Pareja and she is aware you will contact her today at 4:30pm via doxy me.  Message sent to [facility] as I could not add her since the spot could not be unblocked. Wendie Simmer ----- Message ----- From: Lucretia Kern, DO Sent: 07/26/2018  11:21 AM EDT To: Agnes Lawrence, [place],  I think Lenka tried to connect via [email or web address] a little while ago. However, I did not know she was attempting to connect and was with another patient. Just tried again and she was no longer there. Can you please call her and let her know we apologize that we kept missing her. I tried calling and the webex at her appointment time. Can you set her up a doxy.me visit with me at 4:30 tomorrow? I can do that without having you load it. Thank you!

## 2018-07-29 ENCOUNTER — Ambulatory Visit (INDEPENDENT_AMBULATORY_CARE_PROVIDER_SITE_OTHER): Payer: Medicare HMO | Admitting: Orthopaedic Surgery

## 2018-07-29 ENCOUNTER — Encounter (INDEPENDENT_AMBULATORY_CARE_PROVIDER_SITE_OTHER): Payer: Self-pay

## 2018-08-03 DIAGNOSIS — J301 Allergic rhinitis due to pollen: Secondary | ICD-10-CM | POA: Diagnosis not present

## 2018-08-03 DIAGNOSIS — J3089 Other allergic rhinitis: Secondary | ICD-10-CM | POA: Diagnosis not present

## 2018-08-03 DIAGNOSIS — J3081 Allergic rhinitis due to animal (cat) (dog) hair and dander: Secondary | ICD-10-CM | POA: Diagnosis not present

## 2018-08-05 DIAGNOSIS — J301 Allergic rhinitis due to pollen: Secondary | ICD-10-CM | POA: Diagnosis not present

## 2018-08-05 DIAGNOSIS — J3089 Other allergic rhinitis: Secondary | ICD-10-CM | POA: Diagnosis not present

## 2018-08-05 DIAGNOSIS — J3081 Allergic rhinitis due to animal (cat) (dog) hair and dander: Secondary | ICD-10-CM | POA: Diagnosis not present

## 2018-08-09 ENCOUNTER — Ambulatory Visit (INDEPENDENT_AMBULATORY_CARE_PROVIDER_SITE_OTHER): Payer: Medicare HMO | Admitting: Psychology

## 2018-08-09 DIAGNOSIS — R69 Illness, unspecified: Secondary | ICD-10-CM | POA: Diagnosis not present

## 2018-08-09 DIAGNOSIS — F411 Generalized anxiety disorder: Secondary | ICD-10-CM | POA: Diagnosis not present

## 2018-08-09 DIAGNOSIS — F330 Major depressive disorder, recurrent, mild: Secondary | ICD-10-CM | POA: Diagnosis not present

## 2018-08-10 ENCOUNTER — Other Ambulatory Visit: Payer: Self-pay

## 2018-08-10 ENCOUNTER — Ambulatory Visit (INDEPENDENT_AMBULATORY_CARE_PROVIDER_SITE_OTHER): Payer: Medicare HMO | Admitting: Family Medicine

## 2018-08-10 ENCOUNTER — Encounter: Payer: Self-pay | Admitting: Family Medicine

## 2018-08-10 DIAGNOSIS — I10 Essential (primary) hypertension: Secondary | ICD-10-CM

## 2018-08-10 NOTE — Progress Notes (Signed)
Virtual Visit via Video Note  I connected with@  on 08/10/18 at  2:45 PM EDT by a video enabled telemedicine application and verified that I am speaking with the correct person using two identifiers.  Location patient: home Location provider:work or home office Persons participating in the virtual visit: patient, provider  I discussed the limitations of evaluation and management by telemedicine and the availability of in person appointments. The patient expressed understanding and agreed to proceed.   HPI:   Follow up:  Hypertension: -BP at home 110-112/70 -she has been doing ok -continues the low dose of lisinopril -no cp, sob, doe -saw Dr. Glennon Hamilton for counseling -less active because of shoulder issues - seeing orho  ROS: See pertinent positives and negatives per HPI.  Past Medical History:  Diagnosis Date  . Allergy   . Anxiety and depression 12/19/2009  . Arthritis   . Cancer (HCC)    vaginal  . Constipation   . Degenerative disorder of eye   . Diverticulosis   . Essential hypertension, benign 06/26/2009  . GERD 05/27/2007   takes Nexium daily  . Heart murmur    yrs ago  . History of gout   . History of migraine many yrs ago  . Hyperlipidemia    takes Pravastatin daily  . Hypothyroidism   . Insomnia    but doesn't take any meds  . LOW BACK PAIN 05/27/2007  . Numbness    left toes   . Osteoporosis   . Seizures ([place])    last 1983, none since then- last seizure 1988 per pt     Past Surgical History:  Procedure Laterality Date  . ADENOIDECTOMY    . BACK SURGERY     x2  . COLONOSCOPY  2009  . ESOPHAGOGASTRODUODENOSCOPY    . Heel spurs Bilateral   . KNEE ARTHROPLASTY Left 11/07/2013   Procedure: COMPUTER ASSISTED TOTAL KNEE ARTHROPLASTY;  Surgeon: Marybelle Killings, MD;  Location: [facility];  Service: Orthopedics;  Laterality: Left;  Left Total Knee Arthroplasty, Cemented, Computer Assist  . TONSILLECTOMY    . TOTAL KNEE REVISION Left 09/19/2014   Procedure: LEFT TOTAL  KNEE REVISION;  Surgeon: Leandrew Koyanagi, MD;  Location: [facility];  Service: Orthopedics;  Laterality: Left;  . UPPER GASTROINTESTINAL ENDOSCOPY      Family History  Problem Relation Age of Onset  . Arthritis Mother   . Stroke Mother   . Heart murmur Other   . Colon cancer Neg Hx   . Esophageal cancer Neg Hx   . Stomach cancer Neg Hx   . Rectal cancer Neg Hx   . Colon polyps Neg Hx     SOCIAL HX: see hpi   Current Outpatient Medications:  .  albuterol (PROVENTIL HFA;VENTOLIN HFA) 108 (90 Base) MCG/ACT inhaler, Inhale into the lungs., Disp: , Rfl:  .  aspirin 81 MG tablet, Take 81 mg by mouth daily., Disp: , Rfl:  .  beta carotene w/minerals (OCUVITE) tablet, Take 1 tablet by mouth daily., Disp: , Rfl:  .  Cholecalciferol (VITAMIN D3 PO), Take by mouth., Disp: , Rfl:  .  Cyanocobalamin (VITAMIN B 12 PO), Take by mouth daily.  , Disp: , Rfl:  .  esomeprazole (NEXIUM) 20 MG capsule, Take 20 mg by mouth daily before breakfast., Disp: , Rfl:  .  fluticasone (FLONASE) 50 MCG/ACT nasal spray, Place 2 sprays into both nostrils daily., Disp: 16 g, Rfl: 6 .  levocetirizine (XYZAL) 5 MG tablet, , Disp: ,  Rfl:  .  levothyroxine (SYNTHROID, LEVOTHROID) 88 MCG tablet, TAKE 1 TABLET BY MOUTH EVERY DAY, Disp: 45 tablet, Rfl: 3 .  lisinopril (PRINIVIL,ZESTRIL) 5 MG tablet, TAKE 1 TABLET BY MOUTH EVERY DAY, Disp: 90 tablet, Rfl: 1 .  meloxicam (MOBIC) 7.5 MG tablet, Take 1 tablet (7.5 mg total) by mouth 2 (two) times daily., Disp: 30 tablet, Rfl: 0 .  methocarbamol (ROBAXIN) 500 MG tablet, Take 1 tablet (500 mg total) by mouth 4 (four) times daily., Disp: 30 tablet, Rfl: 0 .  pravastatin (PRAVACHOL) 20 MG tablet, TAKE 1 TABLET BY MOUTH EVERY DAY, Disp: 90 tablet, Rfl: 1 .  sertraline (ZOLOFT) 100 MG tablet, Take 1 tablet (100 mg total) by mouth daily. Take with 25mg =125mg , Disp: 90 tablet, Rfl: 1 .  sertraline (ZOLOFT) 25 MG tablet, Take 1 tablet (25 mg total) by mouth daily. Take with 100mg =125mg ., Disp:  90 tablet, Rfl: 1 .  zonisamide (ZONEGRAN) 100 MG capsule, Take 200 mg by mouth 2 (two) times daily., Disp: , Rfl:   Current Facility-Administered Medications:  .  0.9 %  sodium chloride infusion, 500 mL, Intravenous, Continuous, Armbruster, Carlota Raspberry, MD .  methylPREDNISolone acetate (DEPO-MEDROL) injection 40 mg, 40 mg, Intra-articular, Once, Hilts, Michael, MD  EXAMTonette Bihari per patient if applicable: 767/34  GENERAL: alert, oriented, appears well and in no acute distress  HEENT: atraumatic, conjunttiva clear, no obvious abnormalities on inspection of external nose and ears  NECK: normal movements of the head and neck  LUNGS: on inspection no signs of respiratory distress, breathing rate appears normal, no obvious gross SOB, gasping or wheezing  CV: no obvious cyanosis  MS: moves all visible extremities without noticeable abnormality  PSYCH/NEURO: pleasant and cooperative, no obvious depression or anxiety, speech and thought processing grossly intact  ASSESSMENT AND PLAN:  Discussed the following assessment and plan:  Essential hypertension  Doing well. Continue current medication. Advised staying as active as possible despite the shoulder with activities that do not aggravate it. Follow up Dr. Ethlyn Gallery as scheduled.   I discussed the assessment and treatment plan with the patient. The patient was provided an opportunity to ask questions and all were answered. The patient agreed with the plan and demonstrated an understanding of the instructions.   The patient was advised to call back or seek an in-person evaluation if the symptoms worsen or if the condition fails to improve as anticipated.   Beverly Kern, DO

## 2018-08-11 DIAGNOSIS — J3081 Allergic rhinitis due to animal (cat) (dog) hair and dander: Secondary | ICD-10-CM | POA: Diagnosis not present

## 2018-08-11 DIAGNOSIS — J301 Allergic rhinitis due to pollen: Secondary | ICD-10-CM | POA: Diagnosis not present

## 2018-08-11 DIAGNOSIS — J3089 Other allergic rhinitis: Secondary | ICD-10-CM | POA: Diagnosis not present

## 2018-08-20 DIAGNOSIS — J3081 Allergic rhinitis due to animal (cat) (dog) hair and dander: Secondary | ICD-10-CM | POA: Diagnosis not present

## 2018-08-20 DIAGNOSIS — J3089 Other allergic rhinitis: Secondary | ICD-10-CM | POA: Diagnosis not present

## 2018-08-20 DIAGNOSIS — J301 Allergic rhinitis due to pollen: Secondary | ICD-10-CM | POA: Diagnosis not present

## 2018-08-26 ENCOUNTER — Other Ambulatory Visit: Payer: Self-pay | Admitting: Internal Medicine

## 2018-08-27 DIAGNOSIS — J301 Allergic rhinitis due to pollen: Secondary | ICD-10-CM | POA: Diagnosis not present

## 2018-08-27 DIAGNOSIS — J3081 Allergic rhinitis due to animal (cat) (dog) hair and dander: Secondary | ICD-10-CM | POA: Diagnosis not present

## 2018-08-27 DIAGNOSIS — J3089 Other allergic rhinitis: Secondary | ICD-10-CM | POA: Diagnosis not present

## 2018-09-06 ENCOUNTER — Ambulatory Visit (INDEPENDENT_AMBULATORY_CARE_PROVIDER_SITE_OTHER): Payer: Medicare HMO | Admitting: Psychology

## 2018-09-06 DIAGNOSIS — R69 Illness, unspecified: Secondary | ICD-10-CM | POA: Diagnosis not present

## 2018-09-06 DIAGNOSIS — F330 Major depressive disorder, recurrent, mild: Secondary | ICD-10-CM

## 2018-09-06 DIAGNOSIS — F411 Generalized anxiety disorder: Secondary | ICD-10-CM

## 2018-09-10 DIAGNOSIS — J3081 Allergic rhinitis due to animal (cat) (dog) hair and dander: Secondary | ICD-10-CM | POA: Diagnosis not present

## 2018-09-10 DIAGNOSIS — J301 Allergic rhinitis due to pollen: Secondary | ICD-10-CM | POA: Diagnosis not present

## 2018-09-10 DIAGNOSIS — J3089 Other allergic rhinitis: Secondary | ICD-10-CM | POA: Diagnosis not present

## 2018-09-16 DIAGNOSIS — J3081 Allergic rhinitis due to animal (cat) (dog) hair and dander: Secondary | ICD-10-CM | POA: Diagnosis not present

## 2018-09-16 DIAGNOSIS — J3089 Other allergic rhinitis: Secondary | ICD-10-CM | POA: Diagnosis not present

## 2018-09-16 DIAGNOSIS — J301 Allergic rhinitis due to pollen: Secondary | ICD-10-CM | POA: Diagnosis not present

## 2018-09-30 ENCOUNTER — Telehealth: Payer: Self-pay | Admitting: General Surgery

## 2018-09-30 NOTE — Telephone Encounter (Signed)
Called the patients home number and left a voicemail to call the office. Then called her mobile phone and her husband answered he stated the patient is still sleeping and he will give her the message when she wakes up.

## 2018-09-30 NOTE — Progress Notes (Signed)
Prescreened for 6-12 appt.

## 2018-10-01 ENCOUNTER — Other Ambulatory Visit: Payer: Self-pay

## 2018-10-01 ENCOUNTER — Encounter: Payer: Self-pay | Admitting: Gastroenterology

## 2018-10-01 ENCOUNTER — Ambulatory Visit (INDEPENDENT_AMBULATORY_CARE_PROVIDER_SITE_OTHER): Payer: Medicare HMO | Admitting: Gastroenterology

## 2018-10-01 VITALS — Ht 65.0 in | Wt 215.0 lb

## 2018-10-01 DIAGNOSIS — N83209 Unspecified ovarian cyst, unspecified side: Secondary | ICD-10-CM

## 2018-10-01 DIAGNOSIS — R110 Nausea: Secondary | ICD-10-CM | POA: Diagnosis not present

## 2018-10-01 DIAGNOSIS — R194 Change in bowel habit: Secondary | ICD-10-CM

## 2018-10-01 DIAGNOSIS — K580 Irritable bowel syndrome with diarrhea: Secondary | ICD-10-CM

## 2018-10-01 MED ORDER — DICYCLOMINE HCL 10 MG PO CAPS: 10.0000 mg | ORAL_CAPSULE | Freq: Three times a day (TID) | ORAL | 3 refills | Status: DC | PRN

## 2018-10-01 MED ORDER — RIFAXIMIN 550 MG PO TABS: 550.0000 mg | ORAL_TABLET | Freq: Two times a day (BID) | ORAL | 0 refills | Status: AC

## 2018-10-01 MED ORDER — ONDANSETRON 4 MG PO TBDP: 4.0000 mg | ORAL_TABLET | Freq: Four times a day (QID) | ORAL | 3 refills | Status: DC | PRN

## 2018-10-01 NOTE — Progress Notes (Signed)
Virtual Visit via Video Note  I connected with Beverly Ballard on 10/01/18 at  2:30 PM EDT by a video enabled telemedicine application and verified that I am speaking with the correct person using two identifiers.  I discussed the limitations of evaluation and management by telemedicine and the availability of in person appointments. The patient expressed understanding and agreed to proceed.  THIS ENCOUNTER IS A VIRTUAL VISIT DUE TO COVID-19 - PATIENT WAS NOT SEEN IN THE OFFICE. PATIENT HAS CONSENTED TO VIRTUAL VISIT / TELEMEDICINE VISIT USING [employer] APP   Location of patient: home Location of provider: office Persons participating: myself, patient    HPI :  73 y/o female here for a follow up visit. She has been evaluated for chronic nausea extensively in the past, see prior notes for details.   She was last seen for lower abdominal pain a few years ago. She had a negative CT scan other than an ovarian cyst for which follow up imaging was recommended, and she has not had that since 2018. Her "stomach hasn't been feeling right", lots of gas and bloating which is bothering her.  Stool frequency is increased - she is having 4-5 BMs per day, sometimes more. Form is soft to looser. No blood in the stools. She has some discomfort in her lower abdomen, the entire lower abdomen intermittently. It is present frequently. She thinks having a bowel movement can help relieve it. She has significant bloating / gas, she thinks smells quite foul and is new for her. She thinks ongoing for upwards of 4-5 months or so. No new medications she is taking. She is taking nexium 20mg  once daily, controlling reflux. Celiac negative in 2018.   Nausea does not go away, present frequently, using ginger ale. Present most of the time. She is not vomiting.  Appetite is down, no weight loss. She feels she has a "slight fever" - on and off, she thinks to "98-99" or so, feels warm with it, got tested for COVID 19 and was  negative about 3 weeks ago.  t.  She has not felt well since she had pneumonia while traveling to [place] one year ago. She had a fall around xmas time, broke her right foot.   Colonoscopy 02/02/2017 -  The examined portion of the ileum was normal. - Diverticulosis in the sigmoid colon. - Anal papilla(e) were hypertrophied. - The examination was otherwise normal. - No polyps  CT abdomen / pelvis 10/14/2016 - no acute pathology. Small R ovarian cyst. Pelvic US obtained which showed stable ovarian cyst, recommend surveillance in one year per radiology.   Colonoscopy 07/09/2007 - normal  EGD was done 05/07/15 for chronic nausea and for dysphagia. I did not see any evidence of pathology noted to cause dysphagia. Otherwise she didn't have any clear pathology to cause nausea on her exam. She had some benign fundic gland polyps and biopsies of the stomach showed no evidence for H pylori.    Past Medical History:  Diagnosis Date  . Allergy   . Anxiety and depression 12/19/2009  . Arthritis   . Cancer (HCC)    vaginal  . Constipation   . Degenerative disorder of eye   . Diverticulosis   . Essential hypertension, benign 06/26/2009  . GERD 05/27/2007   takes Nexium daily  . Heart murmur    yrs ago  . History of gout   . History of migraine many yrs ago  . Hyperlipidemia    takes Pravastatin daily  .  Hypothyroidism   . Insomnia    but doesn't take any meds  . LOW BACK PAIN 05/27/2007  . Numbness    left toes   . Osteoporosis   . Seizures ([facility])    last 1983, none since then- last seizure 1988 per pt      Past Surgical History:  Procedure Laterality Date  . ADENOIDECTOMY    . BACK SURGERY     x2  . COLONOSCOPY  2009  . ESOPHAGOGASTRODUODENOSCOPY    . Heel spurs Bilateral   . KNEE ARTHROPLASTY Left 11/07/2013   Procedure: COMPUTER ASSISTED TOTAL KNEE ARTHROPLASTY;  Surgeon: Marybelle Killings, MD;  Location: [facility];  Service: Orthopedics;  Laterality: Left;  Left Total Knee Arthroplasty,  Cemented, Computer Assist  . TONSILLECTOMY    . TOTAL KNEE REVISION Left 09/19/2014   Procedure: LEFT TOTAL KNEE REVISION;  Surgeon: Leandrew Koyanagi, MD;  Location: [facility];  Service: Orthopedics;  Laterality: Left;  . UPPER GASTROINTESTINAL ENDOSCOPY     Family History  Problem Relation Age of Onset  . Arthritis Mother   . Stroke Mother   . Heart murmur Other   . Colon cancer Neg Hx   . Esophageal cancer Neg Hx   . Stomach cancer Neg Hx   . Rectal cancer Neg Hx   . Colon polyps Neg Hx    Social History   Tobacco Use  . Smoking status: Former Smoker    Packs/day: 0.25    Years: 20.00    Pack years: 5.00    Types: Cigarettes    Quit date: 09/08/1978    Years since quitting: 40.0  . Smokeless tobacco: Never Used  . Tobacco comment: quit smoking 37yrs ago  Substance Use Topics  . Alcohol use: No  . Drug use: No   Current Outpatient Medications  Medication Sig Dispense Refill  . aspirin 81 MG tablet Take 81 mg by mouth daily.    . beta carotene w/minerals (OCUVITE) tablet Take 1 tablet by mouth daily.    . Cholecalciferol (VITAMIN D3 PO) Take by mouth.    . Cyanocobalamin (VITAMIN B 12 PO) Take by mouth daily.      Marland Kitchen esomeprazole (NEXIUM) 20 MG capsule Take 20 mg by mouth daily before breakfast.    . fluticasone (FLONASE) 50 MCG/ACT nasal spray Place 2 sprays into both nostrils daily. (Patient taking differently: Place 2 sprays into both nostrils daily as needed. ) 16 g 6  . levocetirizine (XYZAL) 5 MG tablet Take by mouth daily.     Marland Kitchen levothyroxine (SYNTHROID) 88 MCG tablet TAKE 1 TABLET BY MOUTH EVERY DAY 45 tablet 3  . lisinopril (PRINIVIL,ZESTRIL) 5 MG tablet TAKE 1 TABLET BY MOUTH EVERY DAY 90 tablet 1  . methocarbamol (ROBAXIN) 500 MG tablet Take 1 tablet (500 mg total) by mouth 4 (four) times daily. (Patient taking differently: Take 500 mg by mouth every 6 (six) hours as needed. ) 30 tablet 0  . pravastatin (PRAVACHOL) 20 MG tablet TAKE 1 TABLET BY MOUTH EVERY DAY 90 tablet 1   . sertraline (ZOLOFT) 100 MG tablet Take 1 tablet (100 mg total) by mouth daily. Take with 25mg =125mg  90 tablet 1  . sertraline (ZOLOFT) 25 MG tablet Take 1 tablet (25 mg total) by mouth daily. Take with 100mg =125mg . 90 tablet 1  . zonisamide (ZONEGRAN) 100 MG capsule Take 200 mg by mouth 2 (two) times daily.     Current Facility-Administered Medications  Medication Dose Route Frequency Provider Last Rate  Last Dose  . methylPREDNISolone acetate (DEPO-MEDROL) injection 40 mg  40 mg Intra-articular Once Hilts, [name], MD       Allergies  Allergen Reactions  . Lamotrigine Swelling    Tongue swelling  . Bupropion Other (See Comments)    not known  . Carbamazepine Other (See Comments)    Hypnatremia  . Levetiracetam     Other reaction(s): Dizziness (intolerance)  . Tramadol Other (See Comments)    not known  . Adhesive [Tape]     ?  Blister after knee surgery  . Clarithromycin     Unsure of reaction    . Doxycycline     Interfered with seizure medication  . Nickel     Had to remove knee implant with nickel and replace it  . Other     Metal and perfumes  . Estradiol Rash  . Latex Rash  . Phenytoin Sodium Extended Other (See Comments)    drowsiness     Review of Systems: All systems reviewed and negative except where noted in HPI.   Lab Results  Component Value Date   WBC 5.8 03/15/2018   HGB 13.6 03/15/2018   HCT 40.7 03/15/2018   MCV 94.8 03/15/2018   PLT 180.0 03/15/2018    Lab Results  Component Value Date   CREATININE 0.81 03/15/2018   BUN 11 03/15/2018   NA 143 03/15/2018   K 4.4 03/15/2018   CL 108 03/15/2018   CO2 29 03/15/2018    Lab Results  Component Value Date   ALT 11 02/26/2017   AST 16 02/26/2017   ALKPHOS 76 02/26/2017   BILITOT 0.4 02/26/2017     Physical Exam: Ht 5\' 5"  (1.651 m) Comment: pt provided over the phone  Wt 215 lb (97.5 kg) Comment: pt provided over the phone  BMI 35.78 kg/m     ASSESSMENT AND PLAN: 73 y/o female  here for reassessment of the following issues:  IBS-D / altered bowel habits / chronic nausea / ovarian cyst - ongoing symptoms of bloating, increased gas, increased stool frequency, discomfort relieved with a bowel movement. For several months now, I think unlikely infectious. She had a colonoscopy relatively recently for lower abdominal discomfort and prior negative imaging in 2018 although she did have an ovarian cyst noted, and that does warrant yearly follow up for which she is overdue per radiology, will order a surveillance pelvic US to ensure stable. Otherwise suspect her symptoms could be due to IBS. Will check her baseline labs to ensure stable, but otherwise discussed options to treat this. Recommend low FODMAP, and I think she may due well with a course of Rifaximin 550mg  TID for 2 weeks after discussion of this. Will also provide some bentyl PRN and some Zofran for her nausea which is chronic and has been extensively worked up in the past. She agreed with the plan. I asked her contact me in a few weeks if no improvement.  Cuero Cellar, MD [facility]

## 2018-10-01 NOTE — Patient Instructions (Addendum)
If you are age 71 or older, your body mass index should be between 23-30. Your Body mass index is 35.78 kg/m. If this is out of the aforementioned range listed, please consider follow up with your Primary Care Provider.  If you are age 28 or younger, your body mass index should be between 19-25. Your Body mass index is 35.78 kg/m. If this is out of the aformentioned range listed, please consider follow up with your Primary Care Provider.     Low-FODMAP Eating Plan  FODMAPs (fermentable oligosaccharides, disaccharides, monosaccharides, and polyols) are sugars that are hard for some people to digest. A low-FODMAP eating plan may help some people who have bowel (intestinal) diseases to manage their symptoms. This meal plan can be complicated to follow. Work with a diet and nutrition specialist (dietitian) to make a low-FODMAP eating plan that is right for you. A dietitian can make sure that you get enough nutrition from this diet. What are tips for following this plan? Reading food labels  Check labels for hidden FODMAPs such as: ? High-fructose syrup. ? Honey. ? Agave. ? Natural fruit flavors. ? Onion or garlic powder.  Choose low-FODMAP foods that contain 3-4 grams of fiber per serving.  Check food labels for serving sizes. Eat only one serving at a time to make sure FODMAP levels stay low. Meal planning  Follow a low-FODMAP eating plan for up to 6 weeks, or as told by your health care provider or dietitian.  To follow the eating plan: 1. Eliminate high-FODMAP foods from your diet completely. 2. Gradually reintroduce high-FODMAP foods into your diet one at a time. Most people should wait a few days after introducing one high-FODMAP food before they introduce the next high-FODMAP food. Your dietitian can recommend how quickly you may reintroduce foods. 3. Keep a daily record of what you eat and drink, and make note of any symptoms that you have after eating. 4. Review your daily record  with a dietitian regularly. Your dietitian can help you identify which foods you can eat and which foods you should avoid. General tips  Drink enough fluid each day to keep your urine pale yellow.  Avoid processed foods. These often have added sugar and may be high in FODMAPs.  Avoid most dairy products, whole grains, and sweeteners.  Work with a dietitian to make sure you get enough fiber in your diet. Recommended foods Grains  Gluten-free grains, such as rice, oats, buckwheat, quinoa, corn, polenta, and millet. Gluten-free pasta, bread, or cereal. Rice noodles. Corn tortillas. Vegetables  Eggplant, zucchini, cucumber, peppers, green beans, Brussels sprouts, bean sprouts, lettuce, arugula, kale, Swiss chard, spinach, collard greens, bok choy, summer squash, potato, and tomato. Limited amounts of corn, carrot, and sweet potato. Green parts of scallions. Fruits  Bananas, oranges, lemons, limes, blueberries, raspberries, strawberries, grapes, cantaloupe, honeydew melon, kiwi, papaya, passion fruit, and pineapple. Limited amounts of dried cranberries, banana chips, and shredded coconut. Dairy  Lactose-free milk, yogurt, and kefir. Lactose-free cottage cheese and ice cream. Non-dairy milks, such as almond, coconut, hemp, and rice milk. Yogurts made of non-dairy milks. Limited amounts of goat cheese, brie, mozzarella, parmesan, swiss, and other hard cheeses. Meats and other protein foods  Unseasoned beef, pork, poultry, or fish. Eggs. Berniece Salines. Tofu (firm) and tempeh. Limited amounts of nuts and seeds, such as almonds, walnuts, [place] nuts, pecans, peanuts, pumpkin seeds, chia seeds, and sunflower seeds. Fats and oils  Butter-free spreads. Vegetable oils, such as olive, canola, and sunflower oil. Seasoning and  other foods  Artificial sweeteners with names that do not end in "ol" such as aspartame, saccharine, and stevia. Maple syrup, white table sugar, raw sugar, brown sugar, and molasses.  Fresh basil, coriander, parsley, rosemary, and thyme. Beverages  Water and mineral water. Sugar-sweetened soft drinks. Small amounts of orange juice or cranberry juice. Black and green tea. Most dry wines. Coffee. This may not be a complete list of low-FODMAP foods. Talk with your dietitian for more information. Foods to avoid Grains  Wheat, including kamut, durum, and semolina. Barley and bulgur. Couscous. Wheat-based cereals. Wheat noodles, bread, crackers, and pastries. Vegetables  Chicory root, artichoke, asparagus, cabbage, snow peas, sugar snap peas, mushrooms, and cauliflower. Onions, garlic, leeks, and the white part of scallions. Fruits  Fresh, dried, and juiced forms of apple, pear, watermelon, peach, plum, cherries, apricots, blackberries, boysenberries, figs, nectarines, and mango. Avocado. Dairy  Milk, yogurt, ice cream, and soft cheese. Cream and sour cream. Milk-based sauces. Custard. Meats and other protein foods  Fried or fatty meat. Sausage. Cashews and pistachios. Soybeans, baked beans, black beans, chickpeas, kidney beans, fava beans, navy beans, lentils, and split peas. Seasoning and other foods  Any sugar-free gum or candy. Foods that contain artificial sweeteners such as sorbitol, mannitol, isomalt, or xylitol. Foods that contain honey, high-fructose corn syrup, or agave. Bouillon, vegetable stock, beef stock, and chicken stock. Garlic and onion powder. Condiments made with onion, such as hummus, chutney, pickles, relish, salad dressing, and salsa. Tomato paste. Beverages  Chicory-based drinks. Coffee substitutes. Chamomile tea. Fennel tea. Sweet or fortified wines such as port or sherry. Diet soft drinks made with isomalt, mannitol, maltitol, sorbitol, or xylitol. Apple, pear, and mango juice. Juices with high-fructose corn syrup. This may not be a complete list of high-FODMAP foods. Talk with your dietitian to discuss what dietary choices are best for you.   Summary  A low-FODMAP eating plan is a short-term diet that eliminates FODMAPs from your diet to help ease symptoms of certain bowel diseases.  The eating plan usually lasts up to 6 weeks. After that, high-FODMAP foods are restarted gradually, one at a time, so you can find out which may be causing symptoms.  A low-FODMAP eating plan can be complicated. It is best to work with a dietitian who has experience with this type of plan. This information is not intended to replace advice given to you by your health care provider. Make sure you discuss any questions you have with your health care provider. Document Released: 12/02/2016 Document Revised: 12/02/2016 Document Reviewed: 12/02/2016 Elsevier Interactive Patient Education  [employer].   Please go to the lab in the basement of our building to have lab work. Hit "B" for basement when you get on the elevator.  When the doors open the lab is on your left.  We will call you with the results. Thank you.  We have sent the following medications to your pharmacy for you to pick up at your convenience: Bentyl 10mg : Take every 8 hours as needed Zofran 4mg  Orally dissolving tablet: Take every 6 hours as needed  We have sent your demographic information and a prescription for Xifaxan to [facility]. This pharmacy is able to get medication approved through insurance and get you the lowest copay possible. If you have not heard from them within 1 week, please call our office at [phone] to let us know.  You have been scheduled for an Pelvic ultrasound at [facility] (1st floor of hospital) on  Friday, 6-19 at 11:00am. Please arrive 15 minutes prior to your appointment for registration.  Arrive with a full bladder. Should you need to reschedule your appointment, please contact radiology at [phone]. This test typically takes about 30 minutes to perform.  Thank you for entrusting me with your care and for choosing [facility], Dr. Pixley Cellar

## 2018-10-04 ENCOUNTER — Other Ambulatory Visit (INDEPENDENT_AMBULATORY_CARE_PROVIDER_SITE_OTHER): Payer: Medicare HMO

## 2018-10-04 ENCOUNTER — Telehealth: Payer: Self-pay

## 2018-10-04 DIAGNOSIS — J301 Allergic rhinitis due to pollen: Secondary | ICD-10-CM | POA: Diagnosis not present

## 2018-10-04 DIAGNOSIS — R110 Nausea: Secondary | ICD-10-CM

## 2018-10-04 DIAGNOSIS — N83209 Unspecified ovarian cyst, unspecified side: Secondary | ICD-10-CM

## 2018-10-04 DIAGNOSIS — K580 Irritable bowel syndrome with diarrhea: Secondary | ICD-10-CM | POA: Diagnosis not present

## 2018-10-04 DIAGNOSIS — J3081 Allergic rhinitis due to animal (cat) (dog) hair and dander: Secondary | ICD-10-CM | POA: Diagnosis not present

## 2018-10-04 DIAGNOSIS — R194 Change in bowel habit: Secondary | ICD-10-CM

## 2018-10-04 DIAGNOSIS — J3089 Other allergic rhinitis: Secondary | ICD-10-CM | POA: Diagnosis not present

## 2018-10-04 LAB — CBC WITH DIFFERENTIAL/PLATELET
Basophils Absolute: 0 10*3/uL (ref 0.0–0.1)
Basophils Relative: 0.5 % (ref 0.0–3.0)
Eosinophils Absolute: 0 10*3/uL (ref 0.0–0.7)
Eosinophils Relative: 0.1 % (ref 0.0–5.0)
HCT: 40.9 % (ref 36.0–46.0)
Hemoglobin: 13.7 g/dL (ref 12.0–15.0)
Lymphocytes Relative: 26.1 % (ref 12.0–46.0)
Lymphs Abs: 1.8 10*3/uL (ref 0.7–4.0)
MCHC: 33.4 g/dL (ref 30.0–36.0)
MCV: 94.3 fl (ref 78.0–100.0)
Monocytes Absolute: 0.5 10*3/uL (ref 0.1–1.0)
Monocytes Relative: 6.5 % (ref 3.0–12.0)
Neutro Abs: 4.7 10*3/uL (ref 1.4–7.7)
Neutrophils Relative %: 66.8 % (ref 43.0–77.0)
Platelets: 196 10*3/uL (ref 150.0–400.0)
RBC: 4.33 Mil/uL (ref 3.87–5.11)
RDW: 13.1 % (ref 11.5–15.5)
WBC: 7 10*3/uL (ref 4.0–10.5)

## 2018-10-04 LAB — COMPREHENSIVE METABOLIC PANEL
ALT: 9 U/L (ref 0–35)
AST: 14 U/L (ref 0–37)
Albumin: 4 g/dL (ref 3.5–5.2)
Alkaline Phosphatase: 83 U/L (ref 39–117)
BUN: 9 mg/dL (ref 6–23)
CO2: 25 mEq/L (ref 19–32)
Calcium: 9.1 mg/dL (ref 8.4–10.5)
Chloride: 107 mEq/L (ref 96–112)
Creatinine, Ser: 0.84 mg/dL (ref 0.40–1.20)
GFR: 66.9 mL/min (ref >60.00)
Glucose, Bld: 117 mg/dL — ABNORMAL HIGH (ref 70–99)
Potassium: 4 mEq/L (ref 3.5–5.1)
Sodium: 141 mEq/L (ref 135–145)
Total Bilirubin: 0.3 mg/dL (ref 0.2–1.2)
Total Protein: 7.1 g/dL (ref 6.0–8.3)

## 2018-10-04 NOTE — Telephone Encounter (Signed)
Katie from [facility] reported that prescription for Xifaxan has been received--she requested chart note for RX PA.   Fax: [phone]  Phone: [phone]

## 2018-10-04 NOTE — Telephone Encounter (Signed)
refaxed to [place] at [phone]

## 2018-10-04 NOTE — Telephone Encounter (Signed)
Office note and insurance card sent to [facility] for [employer] sent on 10-01-18.

## 2018-10-07 NOTE — Telephone Encounter (Signed)
Xifaxan Approved 10-06-2018. REF# [id number] Effective through 10-20-2018. Customer service: [phone] [employer].

## 2018-10-08 ENCOUNTER — Ambulatory Visit (HOSPITAL_COMMUNITY)
Admission: RE | Admit: 2018-10-08 | Discharge: 2018-10-08 | Disposition: A | Discharge status: Home / Self Care | Payer: Medicare HMO | Source: Ambulatory Visit | Attending: Gastroenterology | Admitting: Gastroenterology

## 2018-10-08 ENCOUNTER — Other Ambulatory Visit: Payer: Self-pay

## 2018-10-08 DIAGNOSIS — N83201 Unspecified ovarian cyst, right side: Secondary | ICD-10-CM | POA: Diagnosis not present

## 2018-10-08 DIAGNOSIS — N83209 Unspecified ovarian cyst, unspecified side: Secondary | ICD-10-CM | POA: Insufficient documentation

## 2018-10-11 NOTE — Telephone Encounter (Signed)
Pt has high copay through Medicare part D - Xifaxan would be $480

## 2018-10-14 DIAGNOSIS — J3081 Allergic rhinitis due to animal (cat) (dog) hair and dander: Secondary | ICD-10-CM | POA: Diagnosis not present

## 2018-10-14 DIAGNOSIS — J301 Allergic rhinitis due to pollen: Secondary | ICD-10-CM | POA: Diagnosis not present

## 2018-10-14 DIAGNOSIS — J3089 Other allergic rhinitis: Secondary | ICD-10-CM | POA: Diagnosis not present

## 2018-10-19 DIAGNOSIS — E039 Hypothyroidism, unspecified: Secondary | ICD-10-CM | POA: Diagnosis not present

## 2018-10-19 DIAGNOSIS — K219 Gastro-esophageal reflux disease without esophagitis: Secondary | ICD-10-CM | POA: Diagnosis not present

## 2018-10-19 DIAGNOSIS — I10 Essential (primary) hypertension: Secondary | ICD-10-CM | POA: Diagnosis not present

## 2018-10-19 DIAGNOSIS — R69 Illness, unspecified: Secondary | ICD-10-CM | POA: Diagnosis not present

## 2018-10-19 DIAGNOSIS — E785 Hyperlipidemia, unspecified: Secondary | ICD-10-CM | POA: Diagnosis not present

## 2018-10-19 DIAGNOSIS — J309 Allergic rhinitis, unspecified: Secondary | ICD-10-CM | POA: Diagnosis not present

## 2018-10-19 DIAGNOSIS — G8929 Other chronic pain: Secondary | ICD-10-CM | POA: Diagnosis not present

## 2018-10-19 DIAGNOSIS — G40909 Epilepsy, unspecified, not intractable, without status epilepticus: Secondary | ICD-10-CM | POA: Diagnosis not present

## 2018-10-25 ENCOUNTER — Ambulatory Visit (INDEPENDENT_AMBULATORY_CARE_PROVIDER_SITE_OTHER): Payer: Medicare HMO | Admitting: Psychology

## 2018-10-25 DIAGNOSIS — F411 Generalized anxiety disorder: Secondary | ICD-10-CM

## 2018-10-25 DIAGNOSIS — F330 Major depressive disorder, recurrent, mild: Secondary | ICD-10-CM | POA: Diagnosis not present

## 2018-10-25 DIAGNOSIS — R69 Illness, unspecified: Secondary | ICD-10-CM | POA: Diagnosis not present

## 2018-10-28 ENCOUNTER — Encounter: Payer: Self-pay | Admitting: Family Medicine

## 2018-10-28 DIAGNOSIS — J301 Allergic rhinitis due to pollen: Secondary | ICD-10-CM | POA: Diagnosis not present

## 2018-10-28 DIAGNOSIS — J3081 Allergic rhinitis due to animal (cat) (dog) hair and dander: Secondary | ICD-10-CM | POA: Diagnosis not present

## 2018-10-28 DIAGNOSIS — J3089 Other allergic rhinitis: Secondary | ICD-10-CM | POA: Diagnosis not present

## 2018-11-11 ENCOUNTER — Other Ambulatory Visit: Payer: Self-pay | Admitting: Physician Assistant

## 2018-11-12 ENCOUNTER — Other Ambulatory Visit: Payer: Self-pay | Admitting: Family Medicine

## 2018-11-12 DIAGNOSIS — J3089 Other allergic rhinitis: Secondary | ICD-10-CM | POA: Diagnosis not present

## 2018-11-12 DIAGNOSIS — J3081 Allergic rhinitis due to animal (cat) (dog) hair and dander: Secondary | ICD-10-CM | POA: Diagnosis not present

## 2018-11-12 DIAGNOSIS — J301 Allergic rhinitis due to pollen: Secondary | ICD-10-CM | POA: Diagnosis not present

## 2018-11-16 ENCOUNTER — Ambulatory Visit: Payer: Medicare HMO

## 2018-11-18 ENCOUNTER — Telehealth: Payer: Self-pay | Admitting: *Deleted

## 2018-11-18 NOTE — Telephone Encounter (Signed)
I called the pt and added info to the appt notes.

## 2018-11-18 NOTE — Telephone Encounter (Signed)
Copied from [place] [id number]. Topic: General - Other >> Nov 18, 2018 12:35 PM Wynetta Emery, [place] C wrote: Reason for CRM: pt called in to discuss her apt that is scheduled for tomorrow.   CB: [phone] >> Nov 18, 2018  2:30 PM Reyne Dumas L wrote: Pt states she is returning a call.  She got the call but couldn't hear who was on the other line.

## 2018-11-19 ENCOUNTER — Other Ambulatory Visit: Payer: Self-pay

## 2018-11-19 ENCOUNTER — Ambulatory Visit (INDEPENDENT_AMBULATORY_CARE_PROVIDER_SITE_OTHER): Payer: Medicare HMO | Admitting: Family Medicine

## 2018-11-19 ENCOUNTER — Encounter: Payer: Self-pay | Admitting: Family Medicine

## 2018-11-19 DIAGNOSIS — G8929 Other chronic pain: Secondary | ICD-10-CM

## 2018-11-19 DIAGNOSIS — I700 Atherosclerosis of aorta: Secondary | ICD-10-CM | POA: Diagnosis not present

## 2018-11-19 DIAGNOSIS — R5383 Other fatigue: Secondary | ICD-10-CM | POA: Diagnosis not present

## 2018-11-19 DIAGNOSIS — I10 Essential (primary) hypertension: Secondary | ICD-10-CM

## 2018-11-19 DIAGNOSIS — M25562 Pain in left knee: Secondary | ICD-10-CM | POA: Diagnosis not present

## 2018-11-19 DIAGNOSIS — K219 Gastro-esophageal reflux disease without esophagitis: Secondary | ICD-10-CM

## 2018-11-19 DIAGNOSIS — E785 Hyperlipidemia, unspecified: Secondary | ICD-10-CM

## 2018-11-19 DIAGNOSIS — J309 Allergic rhinitis, unspecified: Secondary | ICD-10-CM

## 2018-11-19 DIAGNOSIS — G40909 Epilepsy, unspecified, not intractable, without status epilepticus: Secondary | ICD-10-CM | POA: Diagnosis not present

## 2018-11-19 DIAGNOSIS — R69 Illness, unspecified: Secondary | ICD-10-CM | POA: Diagnosis not present

## 2018-11-19 DIAGNOSIS — E890 Postprocedural hypothyroidism: Secondary | ICD-10-CM

## 2018-11-19 DIAGNOSIS — F321 Major depressive disorder, single episode, moderate: Secondary | ICD-10-CM | POA: Insufficient documentation

## 2018-11-19 NOTE — Progress Notes (Signed)
Virtual Visit via Video Note  I connected with Beverly Ballard   on 11/19/18 at  1:00 PM EDT by a video enabled telemedicine application and verified that I am speaking with the correct person using two identifiers.  Location patient: home Location provider:work office Persons participating in the virtual visit: patient, provider  I discussed the limitations of evaluation and management by telemedicine and the availability of in person appointments. The patient expressed understanding and agreed to proceed.   Beverly Ballard DOB: 02-Feb-1946 Encounter date: 11/19/2018  This is a 73 y.o. female who presents to establish care. Chief Complaint  Patient presents with  . Establish Care    History of present illness: Husband has had diarrhea and fever and was tested for COVID. Hasn't gotten results back yet. They did test awhile back as well but it was negative.   HTN: this morning was 115/76. On lisinopril 5mg  daily.  GERD:on nexium for this. Occasional symptoms, but doubles up for a week and then it resolves.  Hypothyroid:synthroid 99mcg daily. Follows with Dr. Cruzita Lederer.  Seizure disorder: last seizure 1988. Does still follow with neurology in [place].  Osteopenia: followed by gyn.  Allergies: getting allergy shot.   HL:on pravastatin 20mg  daily. No side effects with medication.   Depression/Fatigue: feeling more tired lately. Has this on and off. Not sure if related to depression. A little more depressed than normal; feels this is related to COVID/not volunteering. zoloft 125mg . Now just going out to get groceries she ordered online. Does still talk with Dr. Glennon Hamilton - who is going on maternity leave in October. Also follows with Dr. Adelene Idler who gives her the zoloft. Has been staying busy with canning - beats, pickles, tomato sauce, making masks/doing crafts. Martin Majestic out with daughter one day and did painting. Talks with mom on facebook (mother in [place]). Was working for [employer] in  [place] taking people to appointments/shopping and also working for [employer].   IBS: was put on abx that took away sx. Cost her $400 and when she stopped then sx came back. Follows with dr. Havery Moros. Still has a lot of pain. Just lives with sx. Has had these sx for years.   Joints still bother her. Knee hasn't been right since second replacement.   Hasn't felt well since coming back from [place] last year when she got pneumonia.   Past Medical History:  Diagnosis Date  . Allergy   . Anxiety and depression 12/19/2009  . Arthritis   . Cancer (HCC)    vaginal  . Constipation   . Degenerative disorder of eye   . Diverticulosis   . Essential hypertension, benign 06/26/2009  . GERD 05/27/2007   takes Nexium daily  . Heart murmur    yrs ago  . History of gout   . History of migraine many yrs ago  . Hyperlipidemia    takes Pravastatin daily  . Hypothyroidism   . Insomnia    but doesn't take any meds  . LOW BACK PAIN 05/27/2007  . Numbness    left toes   . Osteoporosis   . Seizures ([place])    last 1983, none since then- last seizure 1988 per pt    Past Surgical History:  Procedure Laterality Date  . ADENOIDECTOMY    . BACK SURGERY     lumbar x2  . COLONOSCOPY  2009  . ESOPHAGOGASTRODUODENOSCOPY    . Heel spurs Bilateral   . KNEE ARTHROPLASTY Left 11/07/2013   Procedure: COMPUTER  ASSISTED TOTAL KNEE ARTHROPLASTY;  Surgeon: Marybelle Killings, MD;  Location: [facility];  Service: Orthopedics;  Laterality: Left;  Left Total Knee Arthroplasty, Cemented, Computer Assist  . TONSILLECTOMY    . TOTAL KNEE REVISION Left 09/19/2014   Procedure: LEFT TOTAL KNEE REVISION;  Surgeon: Leandrew Koyanagi, MD;  Location: [facility];  Service: Orthopedics;  Laterality: Left;  . UPPER GASTROINTESTINAL ENDOSCOPY     Allergies  Allergen Reactions  . Lamotrigine Swelling    Tongue swelling  . Bupropion Other (See Comments)    not known  . Carbamazepine Other (See Comments)    Hypnatremia  . Levetiracetam      Other reaction(s): Dizziness (intolerance)  . Tramadol Other (See Comments)    not known  . Adhesive [Tape]     ?  Blister after knee surgery  . Clarithromycin     Unsure of reaction    . Doxycycline     Interfered with seizure medication  . Nickel     Had to remove knee implant with nickel and replace it  . Other     Metal and perfumes  . Estradiol Rash  . Latex Rash  . Phenytoin Sodium Extended Other (See Comments)    drowsiness   Current Meds  Medication Sig  . aspirin 81 MG tablet Take 81 mg by mouth daily.  . beta carotene w/minerals (OCUVITE) tablet Take 1 tablet by mouth daily.  . Cholecalciferol (VITAMIN D3 PO) Take by mouth.  . Cyanocobalamin (VITAMIN B 12 PO) Take by mouth daily.    Marland Kitchen dicyclomine (BENTYL) 10 MG capsule Take 1 capsule (10 mg total) by mouth every 8 (eight) hours as needed for spasms.  Marland Kitchen esomeprazole (NEXIUM) 20 MG capsule Take 20 mg by mouth daily before breakfast.  . fluticasone (FLONASE) 50 MCG/ACT nasal spray Place 2 sprays into both nostrils daily. (Patient taking differently: Place 2 sprays into both nostrils daily as needed. )  . levocetirizine (XYZAL) 5 MG tablet Take by mouth daily.   Marland Kitchen levothyroxine (SYNTHROID) 88 MCG tablet TAKE 1 TABLET BY MOUTH EVERY DAY  . lisinopril (ZESTRIL) 5 MG tablet TAKE 1 TABLET BY MOUTH EVERY DAY  . methocarbamol (ROBAXIN) 500 MG tablet Take 1 tablet (500 mg total) by mouth 4 (four) times daily. (Patient taking differently: Take 500 mg by mouth every 6 (six) hours as needed. )  . ondansetron (ZOFRAN ODT) 4 MG disintegrating tablet Take 1 tablet (4 mg total) by mouth every 6 (six) hours as needed for nausea or vomiting.  . pravastatin (PRAVACHOL) 20 MG tablet TAKE 1 TABLET BY MOUTH EVERY DAY  . sertraline (ZOLOFT) 100 MG tablet TAKE 1 TABLET (100 MG TOTAL) BY MOUTH DAILY. TAKE WITH 25MG =125MG   . sertraline (ZOLOFT) 25 MG tablet TAKE 1 TABLET (25 MG TOTAL) BY MOUTH DAILY. TAKE WITH 100MG =125MG .  . zonisamide (ZONEGRAN)  100 MG capsule Take 200 mg by mouth 2 (two) times daily.   Current Facility-Administered Medications for the 11/19/18 encounter (Office Visit) with Caren Macadam, MD  Medication  . methylPREDNISolone acetate (DEPO-MEDROL) injection 40 mg   Social History   Tobacco Use  . Smoking status: Former Smoker    Packs/day: 0.25    Years: 20.00    Pack years: 5.00    Types: Cigarettes    Quit date: 09/08/1978    Years since quitting: 40.2  . Smokeless tobacco: Never Used  . Tobacco comment: quit smoking 27yrs ago  Substance Use Topics  . Alcohol use: No  Family History  Problem Relation Age of Onset  . Aortic dissection Father   . Arthritis Mother   . Stroke Mother 54  . Heart murmur Other   . Colon cancer Neg Hx   . Esophageal cancer Neg Hx   . Stomach cancer Neg Hx   . Rectal cancer Neg Hx   . Colon polyps Neg Hx      Review of Systems  Constitutional: Negative for chills, fatigue and fever.  Respiratory: Negative for cough, chest tightness, shortness of breath and wheezing.   Cardiovascular: Negative for chest pain, palpitations and leg swelling.    Objective:  There were no vitals taken for this visit.      BP Readings from Last 3 Encounters:  06/17/18 (!) 100/54  03/23/18 120/72  03/15/18 (!) 124/58   Wt Readings from Last 3 Encounters:  10/01/18 215 lb (97.5 kg)  06/17/18 222 lb 6.4 oz (100.9 kg)  03/23/18 220 lb (99.8 kg)    EXAM:  GENERAL: alert, oriented, appears well and in no acute distress  HEENT: atraumatic, conjunctiva clear, no obvious abnormalities on inspection of external nose and ears  NECK: normal movements of the head and neck  LUNGS: on inspection no signs of respiratory distress, breathing rate appears normal, no obvious gross SOB, gasping or wheezing  CV: no obvious cyanosis  MS: moves all visible extremities without noticeable abnormality  PSYCH/NEURO: pleasant and cooperative, no obvious depression or anxiety, speech and  thought processing grossly intact.  SKIN: no facial/neck abnormalities  Assessment/Plan  1. Essential hypertension Stable.  Continue current medication.  2. Aortic atherosclerosis ([place]) Noted on imaging.  Blood pressure and cholesterol controlled.  3. Allergic rhinitis, unspecified seasonality, unspecified trigger Stable.  Continue with over-the-counter medications as needed.  4. Gastroesophageal reflux disease, esophagitis presence not specified Stable.  Continue Nexium  5. Postablative hypothyroidism Thyroid has been stable.  Continue Synthroid 88 mcg daily.  6. Seizure disorder ([place]) Stable.  Continue current medication.  7. Fatigue, unspecified type This is a chronic issue.  Mood ties and energy level, but she is also managing multiple medical conditions.  She does have follow-up scheduled with psychiatry/psychology.  These visits are helpful for her.  COVID has created additional strain on her mood.  8. Hyperlipidemia, unspecified hyperlipidemia type Continue pravastatin.  9. Chronic pain of left knee Continue follow-up with Ortho.  10. Depression, major, single episode, moderate (HCC) Continue current medications.  Following with psychiatry consult.  Return AWV.  (with [facility]) She is hesitant to come to the office with current COVID situation.  We discussed that blood work could be ordered at her next visit.   I discussed the assessment and treatment plan with the patient. The patient was provided an opportunity to ask questions and all were answered. The patient agreed with the plan and demonstrated an understanding of the instructions.   The patient was advised to call back or seek an in-person evaluation if the symptoms worsen or if the condition fails to improve as anticipated.  I provided 32 minutes of non-face-to-face time during this encounter.   Micheline Rough, MD

## 2018-11-23 ENCOUNTER — Telehealth: Payer: Self-pay | Admitting: *Deleted

## 2018-11-23 NOTE — Telephone Encounter (Signed)
-----   Message from Caren Macadam, MD sent at 11/19/2018  4:27 PM EDT ----- Please set up AWV with [facility] in 1-2 months.

## 2018-11-23 NOTE — Telephone Encounter (Signed)
Appt scheduled for 9/8 at 3pm.

## 2018-11-24 ENCOUNTER — Ambulatory Visit: Payer: Medicare HMO | Admitting: Physician Assistant

## 2018-11-25 ENCOUNTER — Other Ambulatory Visit: Payer: Self-pay | Admitting: Orthopaedic Surgery

## 2018-11-25 ENCOUNTER — Ambulatory Visit (INDEPENDENT_AMBULATORY_CARE_PROVIDER_SITE_OTHER): Payer: Medicare HMO | Admitting: Psychology

## 2018-11-25 ENCOUNTER — Encounter: Payer: Self-pay | Admitting: Orthopaedic Surgery

## 2018-11-25 DIAGNOSIS — F411 Generalized anxiety disorder: Secondary | ICD-10-CM | POA: Diagnosis not present

## 2018-11-25 DIAGNOSIS — R69 Illness, unspecified: Secondary | ICD-10-CM | POA: Diagnosis not present

## 2018-11-25 DIAGNOSIS — F330 Major depressive disorder, recurrent, mild: Secondary | ICD-10-CM | POA: Diagnosis not present

## 2018-11-25 MED ORDER — METHOCARBAMOL 750 MG PO TABS: 750.0000 mg | ORAL_TABLET | Freq: Three times a day (TID) | ORAL | 3 refills | Status: DC | PRN

## 2018-11-26 ENCOUNTER — Other Ambulatory Visit: Payer: Self-pay | Admitting: Physician Assistant

## 2018-11-26 DIAGNOSIS — J301 Allergic rhinitis due to pollen: Secondary | ICD-10-CM | POA: Diagnosis not present

## 2018-11-26 DIAGNOSIS — J3089 Other allergic rhinitis: Secondary | ICD-10-CM | POA: Diagnosis not present

## 2018-11-26 DIAGNOSIS — J3081 Allergic rhinitis due to animal (cat) (dog) hair and dander: Secondary | ICD-10-CM | POA: Diagnosis not present

## 2018-11-26 NOTE — Telephone Encounter (Signed)
Ok to send in bid prn

## 2018-11-26 NOTE — Telephone Encounter (Signed)
Sent to pharmacy 

## 2018-11-26 NOTE — Telephone Encounter (Signed)
Please advise 

## 2018-12-02 DIAGNOSIS — J3081 Allergic rhinitis due to animal (cat) (dog) hair and dander: Secondary | ICD-10-CM | POA: Diagnosis not present

## 2018-12-02 DIAGNOSIS — J301 Allergic rhinitis due to pollen: Secondary | ICD-10-CM | POA: Diagnosis not present

## 2018-12-02 DIAGNOSIS — J3089 Other allergic rhinitis: Secondary | ICD-10-CM | POA: Diagnosis not present

## 2018-12-07 DIAGNOSIS — J301 Allergic rhinitis due to pollen: Secondary | ICD-10-CM | POA: Diagnosis not present

## 2018-12-07 DIAGNOSIS — J3081 Allergic rhinitis due to animal (cat) (dog) hair and dander: Secondary | ICD-10-CM | POA: Diagnosis not present

## 2018-12-07 DIAGNOSIS — J3089 Other allergic rhinitis: Secondary | ICD-10-CM | POA: Diagnosis not present

## 2018-12-07 DIAGNOSIS — R05 Cough: Secondary | ICD-10-CM | POA: Diagnosis not present

## 2018-12-07 DIAGNOSIS — H1045 Other chronic allergic conjunctivitis: Secondary | ICD-10-CM | POA: Diagnosis not present

## 2018-12-13 DIAGNOSIS — J3081 Allergic rhinitis due to animal (cat) (dog) hair and dander: Secondary | ICD-10-CM | POA: Diagnosis not present

## 2018-12-13 DIAGNOSIS — J301 Allergic rhinitis due to pollen: Secondary | ICD-10-CM | POA: Diagnosis not present

## 2018-12-13 DIAGNOSIS — J3089 Other allergic rhinitis: Secondary | ICD-10-CM | POA: Diagnosis not present

## 2018-12-23 ENCOUNTER — Ambulatory Visit (INDEPENDENT_AMBULATORY_CARE_PROVIDER_SITE_OTHER): Payer: Medicare HMO | Admitting: Psychology

## 2018-12-23 DIAGNOSIS — F411 Generalized anxiety disorder: Secondary | ICD-10-CM

## 2018-12-23 DIAGNOSIS — F330 Major depressive disorder, recurrent, mild: Secondary | ICD-10-CM

## 2018-12-23 DIAGNOSIS — R69 Illness, unspecified: Secondary | ICD-10-CM | POA: Diagnosis not present

## 2018-12-24 ENCOUNTER — Other Ambulatory Visit: Payer: Self-pay

## 2018-12-24 ENCOUNTER — Encounter: Payer: Self-pay | Admitting: Physician Assistant

## 2018-12-24 ENCOUNTER — Ambulatory Visit (INDEPENDENT_AMBULATORY_CARE_PROVIDER_SITE_OTHER): Payer: Medicare HMO | Admitting: Physician Assistant

## 2018-12-24 DIAGNOSIS — F331 Major depressive disorder, recurrent, moderate: Secondary | ICD-10-CM | POA: Diagnosis not present

## 2018-12-24 DIAGNOSIS — R69 Illness, unspecified: Secondary | ICD-10-CM | POA: Diagnosis not present

## 2018-12-24 NOTE — Progress Notes (Signed)
[facility] Check  Patient ID: Beverly Ballard,  MRN: [id number]  PCP: Caren Macadam, MD  Date of Evaluation: 12/24/2018 Time spent:15 minutes  Chief Complaint:  Chief Complaint    Depression; Follow-up     Virtual Visit via Telephone Note  I connected with patient by a video enabled telemedicine application or telephone, with their informed consent, and verified patient privacy and that I am speaking with the correct person using two identifiers.  I am private, in my home and the patient is home.  I discussed the limitations, risks, security and privacy concerns of performing an evaluation and management service by telephone and the availability of in person appointments. I also discussed with the patient that there may be a patient responsible charge related to this service. The patient expressed understanding and agreed to proceed.   I discussed the assessment and treatment plan with the patient. The patient was provided an opportunity to ask questions and all were answered. The patient agreed with the plan and demonstrated an understanding of the instructions.   The patient was advised to call back or seek an in-person evaluation if the symptoms worsen or if the condition fails to improve as anticipated.  I provided 15 minutes of non-face-to-face time during this encounter.  HISTORY/CURRENT STATUS: HPI For 6 month med check.  Doing fine. When the pandemic first started, she got a little depressed but was able to "pull myself together."  She was sad because she was not able to go home to [place] to visit her family.  Her mother turns 62 this year and they were planning on having a big party which of course was canceled unfortunately due to [place].  Patient states that she is okay though and is content being at home.  She is reading and has been canning, freezing and putting up vegetables for the winter.  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  Patient sleeps well most of the time.  She is able to enjoy things.  Energy and motivation are good.  Does not cry easily.  No suicidal or homicidal thoughts.  No real complaints of anxiety.  Sometimes if she is triggered, get kind of nervous but it will go away quickly.  She has been seeing Dr. Glennon Hamilton for counseling and that has been going well.  Dr. Glennon Hamilton is now on maternity leave.  Denies dizziness, syncope, seizures, numbness, tingling, tremor, tics, unsteady gait, slurred speech, confusion. Denies muscle or joint pain, stiffness, or dystonia.  Individual Medical History/ Review of Systems: Changes? :Yes  chronic nausea.  Sees Dr. Trinna Balloon   Past medications for mental health diagnoses include: Paxil, Prozac, Valium  Allergies: Lamotrigine, Bupropion, Carbamazepine, Levetiracetam, Tramadol, Adhesive [tape], Clarithromycin, Doxycycline, Nickel, Other, Estradiol, Latex, and Phenytoin sodium extended  Current Medications:  Current Outpatient Medications:  .  aspirin 81 MG tablet, Take 81 mg by mouth daily., Disp: , Rfl:  .  beta carotene w/minerals (OCUVITE) tablet, Take 1 tablet by mouth daily., Disp: , Rfl:  .  Cholecalciferol (VITAMIN D3 PO), Take by mouth., Disp: , Rfl:  .  Cyanocobalamin (VITAMIN B 12 PO), Take by mouth daily.  , Disp: , Rfl:  .  cyclobenzaprine (FLEXERIL) 10 MG tablet, Take 1 tablet (10 mg total) by mouth 2 (two) times daily as needed for muscle spasms., Disp: 30 tablet, Rfl: 0 .  dicyclomine (BENTYL) 10 MG capsule, Take 1 capsule (10 mg total) by mouth every 8 (  eight) hours as needed for spasms., Disp: 30 capsule, Rfl: 3 .  esomeprazole (NEXIUM) 20 MG capsule, Take 20 mg by mouth daily before breakfast., Disp: , Rfl:  .  fluticasone (FLONASE) 50 MCG/ACT nasal spray, Place 2 sprays into both nostrils daily. (Patient taking differently: Place 2 sprays into both nostrils daily as needed. ), Disp: 16 g, Rfl: 6 .  levocetirizine (XYZAL) 5 MG tablet, Take by  mouth daily. , Disp: , Rfl:  .  levothyroxine (SYNTHROID) 88 MCG tablet, TAKE 1 TABLET BY MOUTH EVERY DAY, Disp: 45 tablet, Rfl: 3 .  lisinopril (ZESTRIL) 5 MG tablet, TAKE 1 TABLET BY MOUTH EVERY DAY, Disp: 90 tablet, Rfl: 1 .  methocarbamol (ROBAXIN) 500 MG tablet, Take 1 tablet (500 mg total) by mouth 4 (four) times daily. (Patient taking differently: Take 500 mg by mouth every 6 (six) hours as needed. ), Disp: 30 tablet, Rfl: 0 .  ondansetron (ZOFRAN ODT) 4 MG disintegrating tablet, Take 1 tablet (4 mg total) by mouth every 6 (six) hours as needed for nausea or vomiting., Disp: 30 tablet, Rfl: 3 .  pravastatin (PRAVACHOL) 20 MG tablet, TAKE 1 TABLET BY MOUTH EVERY DAY, Disp: 90 tablet, Rfl: 1 .  sertraline (ZOLOFT) 100 MG tablet, TAKE 1 TABLET (100 MG TOTAL) BY MOUTH DAILY. TAKE WITH 25MG =125MG , Disp: 90 tablet, Rfl: 1 .  sertraline (ZOLOFT) 25 MG tablet, TAKE 1 TABLET (25 MG TOTAL) BY MOUTH DAILY. TAKE WITH 100MG =125MG ., Disp: 90 tablet, Rfl: 1 .  zonisamide (ZONEGRAN) 100 MG capsule, Take 200 mg by mouth 2 (two) times daily., Disp: , Rfl:   Current Facility-Administered Medications:  .  methylPREDNISolone acetate (DEPO-MEDROL) injection 40 mg, 40 mg, Intra-articular, Once, Hilts, Michael, MD Medication Side Effects: none  Family Medical/ Social History: Changes? No  MENTAL HEALTH EXAM:  There were no vitals taken for this visit.There is no height or weight on file to calculate BMI.  General Appearance: unable to assess  Eye Contact:  unable to assess  Speech:  Clear and Coherent  Volume:  Normal  Mood:  Euthymic  Affect:  unable to assess  Thought Process:  Goal Directed  Orientation:  Full (Time, Place, and Person)  Thought Content: Logical   Suicidal Thoughts:  No  Homicidal Thoughts:  No  Memory:  WNL  Judgement:  Good  Insight:  Good  Psychomotor Activity:  unable to assess  Concentration:  Concentration: Good  Recall:  Good  Fund of Knowledge: Good  Language: Good   Assets:  Desire for Improvement  ADL's:  Intact  Cognition: WNL  Prognosis:  Good    DIAGNOSES:    ICD-10-CM   1. Major depressive disorder, recurrent episode, moderate (HCC)  F33.1     Receiving Psychotherapy: Yes With Dr. Glennon Hamilton   RECOMMENDATIONS:  Continue Zoloft 125 mg p.o. daily. If she should need emergent help from a therapist prior to the time that Dr. Glennon Hamilton returns from maternity leave, she can contact our office and see 1 of our therapist here in the interim. Return in 6 months.  Donnal Moat, PA-C   This record has been created using [employer].  Chart creation errors have been sought, but may not always have been located and corrected. Such creation errors do not reflect on the standard of medical care.

## 2018-12-28 ENCOUNTER — Telehealth (INDEPENDENT_AMBULATORY_CARE_PROVIDER_SITE_OTHER): Payer: Medicare HMO | Admitting: Family Medicine

## 2018-12-28 ENCOUNTER — Encounter: Payer: Self-pay | Admitting: Family Medicine

## 2018-12-28 ENCOUNTER — Other Ambulatory Visit: Payer: Self-pay

## 2018-12-28 VITALS — BP 121/78 | Temp 97.9°F | Wt 230.0 lb

## 2018-12-28 DIAGNOSIS — E890 Postprocedural hypothyroidism: Secondary | ICD-10-CM | POA: Diagnosis not present

## 2018-12-28 DIAGNOSIS — Z0000 Encounter for general adult medical examination without abnormal findings: Secondary | ICD-10-CM

## 2018-12-28 DIAGNOSIS — E6609 Other obesity due to excess calories: Secondary | ICD-10-CM | POA: Diagnosis not present

## 2018-12-28 DIAGNOSIS — I700 Atherosclerosis of aorta: Secondary | ICD-10-CM | POA: Diagnosis not present

## 2018-12-28 DIAGNOSIS — G40909 Epilepsy, unspecified, not intractable, without status epilepticus: Secondary | ICD-10-CM | POA: Diagnosis not present

## 2018-12-28 DIAGNOSIS — F321 Major depressive disorder, single episode, moderate: Secondary | ICD-10-CM

## 2018-12-28 DIAGNOSIS — Z6832 Body mass index (BMI) 32.0-32.9, adult: Secondary | ICD-10-CM

## 2018-12-28 DIAGNOSIS — E785 Hyperlipidemia, unspecified: Secondary | ICD-10-CM | POA: Diagnosis not present

## 2018-12-28 DIAGNOSIS — E039 Hypothyroidism, unspecified: Secondary | ICD-10-CM | POA: Diagnosis not present

## 2018-12-28 DIAGNOSIS — R69 Illness, unspecified: Secondary | ICD-10-CM | POA: Diagnosis not present

## 2018-12-28 DIAGNOSIS — E66811 Obesity, class 1: Secondary | ICD-10-CM

## 2018-12-28 NOTE — Patient Instructions (Addendum)
  Beverly Ballard , Thank you for taking time to come for your Medicare Wellness Visit. I appreciate your ongoing commitment to your health goals. Please review the following plan we discussed and let me know if I can assist you in the future.   These are the goals we discussed: Goals      Exercise   . Exercise 150 min/wk Moderate Activity     Get out and do some walking Do some online exercise classes Consider the Keto or Mediterreanean diet       Schedule your lab visit.  This is a list of the screening recommended for you and due dates:  Health Maintenance  Topic Date Due  . Flu Shot  Reports scheduled  . Mammogram  03/30/2020  . Tetanus Vaccine  05/30/2024  . Colon Cancer Screening  03/06/2027  . DEXA scan (bone density measurement)  Completed  .  Hepatitis C: One time screening is recommended by Center for Disease Control  (CDC) for  adults born from [birth year] through 1963.   Completed  . Pneumonia vaccines  Completed  *Topic was postponed. The date shown is not the original due date.

## 2018-12-28 NOTE — Progress Notes (Signed)
Medicare Annual Preventive Care Visit  (initial annual wellness or annual wellness exam)  Virtual Visit via Video Note  I connected with Darina  on 09/07/18 at  3:20 PM EDT by a video enabled telemedicine application and verified that I am speaking with the correct person using two identifiers.  Location patient: home Location provider:work or home office Persons participating in the virtual visit: patient, provider  Discussed limitations of virtual visit. Patient prefers to proceed to decrease risks during the [place] pandemic.   Due for labs (tsh, lipids, hgba1c), mammo in December, lifestyle recs, flu shot, depression screen.  Concerns and/or follow up today:  Beverly Ballard is a pleasant 61 with a PMH of HTN, HL, Aortic Atherosclerosis, Obesity, Depression (sees Dr. Adelene Idler for medication management and Dr. Glennon Hamilton for CBT), Hypothyroidism (sees endocrinologist for management), Osteopenia (sees gyn for management), Seizure d/o (sees neurologist for management), GERD/IBS (sees GI) and seasonal allergies. She Saw Dr. Ethlyn Gallery recently in July for follow up. Struggles with chronic indigestion, rumbling in the stomach Reports is trying to watch what she eats. She is trying to eat small portions. They do eat a fair amount of carbohydrates. Reports her mood has been pretty good lately.  See HM section in Epic for other details of completed HM. See scanned documentation under Media Tab for further documentation HPI, health risk assessment. See Media Tab and Care Teams sections in Epic for other providers.  ROS: negative for report of fevers, unintentional weight loss, vision changes, vision loss, hearing loss or change, chest pain, sob, hemoptysis, melena, hematochezia, hematuria, genital discharge or lesions, falls, bleeding or bruising, loc, thoughts of suicide or self harm, memory loss  1.) Patient-completed health risk assessment  - completed and reviewed, see scanned documentation  2.)  Review of Medical History: -PMH, PSH, Family History and current specialty and care providers reviewed and updated and listed below  - see scanned in document in chart and below Patient Care Team: Caren Macadam, MD as PCP - General (Family Medicine) Minus Breeding, MD as Consulting Physician (Cardiology) Azucena Fallen, MD as Consulting Physician (Obstetrics and Gynecology) Kirtland Bouchard, PhD as Consulting Physician (Psychology) Donnal Moat (Psychiatry) Armbruster, Carlota Raspberry, MD as Consulting Physician (Gastroenterology) Kerin Perna., MD as Referring Physician (Neurology)   Past Medical History:  Diagnosis Date  . Allergy   . Anxiety and depression 12/19/2009  . Arthritis   . Cancer (HCC)    vaginal  . Constipation   . Degenerative disorder of eye   . Diverticulosis   . Essential hypertension, benign 06/26/2009  . GERD 05/27/2007   takes Nexium daily  . Heart murmur    yrs ago  . History of gout   . History of migraine many yrs ago  . Hyperlipidemia    takes Pravastatin daily  . Hypothyroidism   . Insomnia    but doesn't take any meds  . LOW BACK PAIN 05/27/2007  . Numbness    left toes   . Osteoporosis   . Seizures ([place])    last 1983, none since then- last seizure 1988 per pt     Past Surgical History:  Procedure Laterality Date  . ADENOIDECTOMY    . BACK SURGERY     lumbar x2  . COLONOSCOPY  2009  . ESOPHAGOGASTRODUODENOSCOPY    . Heel spurs Bilateral   . KNEE ARTHROPLASTY Left 11/07/2013   Procedure: COMPUTER ASSISTED TOTAL KNEE ARTHROPLASTY;  Surgeon: Marybelle Killings, MD;  Location: [facility];  Service: Orthopedics;  Laterality: Left;  Left Total Knee Arthroplasty, Cemented, Computer Assist  . TONSILLECTOMY    . TOTAL KNEE REVISION Left 09/19/2014   Procedure: LEFT TOTAL KNEE REVISION;  Surgeon: Leandrew Koyanagi, MD;  Location: [facility];  Service: Orthopedics;  Laterality: Left;  . UPPER GASTROINTESTINAL ENDOSCOPY      Social History   Socioeconomic History  .  Marital status: Married    Spouse name: Not on file  . Number of children: 1  . Years of education: Not on file  . Highest education level: Not on file  Occupational History  . Occupation: Retired  Scientific laboratory technician  . Financial resource strain: Not on file  . Food insecurity    Worry: Not on file    Inability: Not on file  . Transportation needs    Medical: Not on file    Non-medical: Not on file  Tobacco Use  . Smoking status: Former Smoker    Packs/day: 0.25    Years: 20.00    Pack years: 5.00    Types: Cigarettes    Quit date: 09/08/1978    Years since quitting: 40.3  . Smokeless tobacco: Never Used  . Tobacco comment: quit smoking 71yrs ago  Substance and Sexual Activity  . Alcohol use: No  . Drug use: No  . Sexual activity: Never    Birth control/protection: Post-menopausal  Lifestyle  . Physical activity    Days per week: Not on file    Minutes per session: Not on file  . Stress: Not on file  Relationships  . Social Herbalist on phone: Not on file    Gets together: Not on file    Attends religious service: Not on file    Active member of club or organization: Not on file    Attends meetings of clubs or organizations: Not on file    Relationship status: Not on file  . Intimate partner violence    Fear of current or ex partner: Not on file    Emotionally abused: Not on file    Physically abused: Not on file    Forced sexual activity: Not on file  Other Topics Concern  . Not on file  Social History Narrative  . Not on file    Family History  Problem Relation Age of Onset  . Aortic dissection Father   . Arthritis Mother   . Stroke Mother 40  . Heart murmur Other   . Colon cancer Neg Hx   . Esophageal cancer Neg Hx   . Stomach cancer Neg Hx   . Rectal cancer Neg Hx   . Colon polyps Neg Hx     Current Outpatient Medications on File Prior to Visit  Medication Sig Dispense Refill  . aspirin 81 MG tablet Take 81 mg by mouth daily.    . beta  carotene w/minerals (OCUVITE) tablet Take 1 tablet by mouth daily.    . Cholecalciferol (VITAMIN D3 PO) Take by mouth.    . Cyanocobalamin (VITAMIN B 12 PO) Take by mouth daily.      . cyclobenzaprine (FLEXERIL) 10 MG tablet Take 1 tablet (10 mg total) by mouth 2 (two) times daily as needed for muscle spasms. 30 tablet 0  . dicyclomine (BENTYL) 10 MG capsule Take 1 capsule (10 mg total) by mouth every 8 (eight) hours as needed for spasms. 30 capsule 3  . esomeprazole (NEXIUM) 20 MG capsule Take 20 mg by mouth daily before breakfast.    .  fluticasone (FLONASE) 50 MCG/ACT nasal spray Place 2 sprays into both nostrils daily. (Patient taking differently: Place 2 sprays into both nostrils daily as needed. ) 16 g 6  . levocetirizine (XYZAL) 5 MG tablet Take by mouth daily.     Marland Kitchen levothyroxine (SYNTHROID) 88 MCG tablet TAKE 1 TABLET BY MOUTH EVERY DAY 45 tablet 3  . lisinopril (ZESTRIL) 5 MG tablet TAKE 1 TABLET BY MOUTH EVERY DAY 90 tablet 1  . methocarbamol (ROBAXIN) 500 MG tablet Take 1 tablet (500 mg total) by mouth 4 (four) times daily. (Patient taking differently: Take 500 mg by mouth every 6 (six) hours as needed. ) 30 tablet 0  . ondansetron (ZOFRAN ODT) 4 MG disintegrating tablet Take 1 tablet (4 mg total) by mouth every 6 (six) hours as needed for nausea or vomiting. 30 tablet 3  . pravastatin (PRAVACHOL) 20 MG tablet TAKE 1 TABLET BY MOUTH EVERY DAY 90 tablet 1  . sertraline (ZOLOFT) 100 MG tablet TAKE 1 TABLET (100 MG TOTAL) BY MOUTH DAILY. TAKE WITH 25MG =125MG  90 tablet 1  . sertraline (ZOLOFT) 25 MG tablet TAKE 1 TABLET (25 MG TOTAL) BY MOUTH DAILY. TAKE WITH 100MG =125MG . 90 tablet 1  . zonisamide (ZONEGRAN) 100 MG capsule Take 200 mg by mouth 2 (two) times daily.     Current Facility-Administered Medications on File Prior to Visit  Medication Dose Route Frequency Provider Last Rate Last Dose  . methylPREDNISolone acetate (DEPO-MEDROL) injection 40 mg  40 mg Intra-articular Once Hilts,  Michael, MD         3.) Review of functional ability and level of safety:  Any difficulty hearing?  See scanned documentation  History of falling?  See scanned documentation  Any trouble with IADLs - using a phone, using transportation, grocery shopping, preparing meals, doing housework, doing laundry, taking medications and managing money?  See scanned documentation  Advance Directives?  Discussed briefly and offered more resources and detailed discussion with our trained staff. Has living will and advanced directives.  See summary of recommendations in Patient Instructions below.  4.) Physical Exam Vitals:   12/28/18 1513  BP: 121/78  Temp: 97.9 F (36.6 C)   Estimated body mass index is 38.27 kg/m as calculated from the following:   Height as of 10/01/18: 5\' 5"  (1.651 m).   Weight as of this encounter: 230 lb (104.3 kg).  EKG (optional): deferred  VITALS per patient if applicable: Vitals:   0000000 1513  BP: 121/78  Temp: 97.9 F (36.6 C)   Body mass index is 38.27 kg/m.   GENERAL: alert, oriented, appears well and in no acute distress; visual acuity grossly intact, full vision exam deferred due to pandemic and/or virtual encounter  HEENT: atraumatic, conjunttiva clear, no obvious abnormalities on inspection of external nose and ears  NECK: normal movements of the head and neck  LUNGS: on inspection no signs of respiratory distress, breathing rate appears normal, no obvious gross SOB, gasping or wheezing  CV: no obvious cyanosis  MS: moves all visible extremities without noticeable abnormality  PSYCH/NEURO: pleasant and cooperative, no obvious depression or anxiety, speech and thought processing grossly intact, Cognitive function grossly intact  Depression screen [facility] 2/9 06/17/2018 03/15/2018 11/12/2017 11/09/2017 07/06/2017  Decreased Interest 0 0 0 0 0  Down, Depressed, Hopeless 0 0 0 0 0  PHQ - 2 Score 0 0 0 0 0  Altered sleeping 0 0 - 0 0  Tired,  decreased energy 3 3 - 0 3  Change in  appetite 0 0 - 0 0  Feeling bad or failure about yourself  0 0 - 0 0  Trouble concentrating 0 0 - 0 0  Moving slowly or fidgety/restless 0 0 - 0 0  Suicidal thoughts 0 0 - 0 0  PHQ-9 Score 3 3 - 0 3  Some recent data might be hidden     See patient instructions for recommendations.  Education and counseling regarding the above review of health provided with a plan for the following: -see scanned patient completed form for further details -fall prevention strategies discussed  -healthy lifestyle discussed -importance and resources for completing advanced directives discussed -see patient instructions below for any other recommendations provided  4)The following written screening schedule of preventive measures were reviewed with assessment and plan made per below, orders and patient instructions:      AAA screening done if applicable     Alcohol screening done     Obesity Screening and counseling done     STI screening (Hep C if born 1943-65) offered and per pt wishes     Tobacco Screening done done       Pneumococcal (PPSV23 -one dose after 64, one before if risk factors), influenza yearly and hepatitis B vaccines (if high risk - end stage renal disease, IV drugs, homosexual men, live in home for mentally retarded, hemophilia receiving factors) ASSESSMENT/PLAN: done if applicable      Screening mammograph (yearly if >40) ASSESSMENT/PLAN: utd or ordered, done 03/2018 with gyn, [place]      Screening Pap smear/pelvic exam (q2 years) ASSESSMENT/PLAN: n/a, declined, see gyn - Dr. Benjie Karvonen      Colorectal cancer screening (FOBT yearly or flex sig q4y or colonoscopy q10y or barium enema q4y) ASSESSMENT/PLAN: utd, Colonoscopy done in 2018      Diabetes outpatient self-management training services ASSESSMENT/PLAN: utd or done      Bone mass measurements(covered q2y if indicated - estrogen def, osteoporosis, hyperparathyroid, vertebral  abnormalities, osteoporosis or steroids) ASSESSMENT/PLAN: utd or discussed and ordered per pt wishes - last dexa in epic in 2017 with Dr. Benjie Karvonen      Screening for glaucoma(q1y if high risk - diabetes, FH, AA and > 50 or hispanic and > 65) ASSESSMENT/PLAN: utd or advised      Medical nutritional therapy for individuals with diabetes or renal disease ASSESSMENT/PLAN: see orders      Cardiovascular screening blood tests (lipids q5y) ASSESSMENT/PLAN: see orders and labs      Diabetes screening tests ASSESSMENT/PLAN: see orders and labs   7.) Summary:   Medicare annual wellness visit, subsequent -risk factors and conditions per above assessment were discussed and treatment, recommendations and referrals were offered per documentation above and orders and patient instructions. -reports is scheduled for flu shot -she plans to get mammogram in December  Postablative hypothyroidism -will order her thyroid test with her labs  Depression, major, single episode, moderate ([place]) -reports is doing well  Hyperlipidemia, unspecified hyperlipidemia type - Plan: Lipid panel  Class 1 obesity due to excess calories with body mass index (BMI) of 32.0 to 32.9 in adult, unspecified whether serious comorbidity present -discussed options, considering keto, advised regular activity  Aortic atherosclerosis ([place]) - Plan: Hemoglobin A1c  Seizure disorder ([place]) -stable  GI issues: -follow up with GI, dietary discussion about various diets  Patient Instructions    Beverly Ballard , Thank you for taking time to come for your Medicare Wellness Visit. I appreciate your ongoing commitment to your health goals. Please review  the following plan we discussed and let me know if I can assist you in the future.   These are the goals we discussed: Goals      Exercise   . Exercise 150 min/wk Moderate Activity     Will try chair yoga !      General   . patient (pt-stated)     Increase activity and be more  active with volunteering.     . Patient Stated     To feel better with back and surgery        This is a list of the screening recommended for you and due dates:  Health Maintenance  Topic Date Due  . Flu Shot  06/27/2019*  . Mammogram  03/30/2020  . Tetanus Vaccine  05/30/2024  . Colon Cancer Screening  03/06/2027  . DEXA scan (bone density measurement)  Completed  .  Hepatitis C: One time screening is recommended by Center for Disease Control  (CDC) for  adults born from [birth year] through 1963.   Completed  . Pneumonia vaccines  Completed  *Topic was postponed. The date shown is not the original due date.    Lucretia Kern, DO

## 2018-12-30 ENCOUNTER — Other Ambulatory Visit: Payer: Self-pay

## 2018-12-30 ENCOUNTER — Other Ambulatory Visit (INDEPENDENT_AMBULATORY_CARE_PROVIDER_SITE_OTHER): Payer: Medicare HMO

## 2018-12-30 DIAGNOSIS — I700 Atherosclerosis of aorta: Secondary | ICD-10-CM

## 2018-12-30 DIAGNOSIS — E039 Hypothyroidism, unspecified: Secondary | ICD-10-CM

## 2018-12-30 DIAGNOSIS — E785 Hyperlipidemia, unspecified: Secondary | ICD-10-CM

## 2018-12-30 DIAGNOSIS — J3089 Other allergic rhinitis: Secondary | ICD-10-CM | POA: Diagnosis not present

## 2018-12-30 DIAGNOSIS — J3081 Allergic rhinitis due to animal (cat) (dog) hair and dander: Secondary | ICD-10-CM | POA: Diagnosis not present

## 2018-12-30 DIAGNOSIS — J301 Allergic rhinitis due to pollen: Secondary | ICD-10-CM | POA: Diagnosis not present

## 2018-12-30 LAB — LIPID PANEL
Cholesterol: 176 mg/dL (ref 0–200)
HDL: 43.2 mg/dL (ref >39.00)
LDL Cholesterol: 103 mg/dL — ABNORMAL HIGH (ref 0–99)
NonHDL: 132.72
Total CHOL/HDL Ratio: 4
Triglycerides: 151 mg/dL — ABNORMAL HIGH (ref 0.0–149.0)
VLDL: 30.2 mg/dL (ref 0.0–40.0)

## 2018-12-30 LAB — HEMOGLOBIN A1C: Hgb A1c MFr Bld: 5.7 % (ref 4.6–6.5)

## 2018-12-30 LAB — TSH: TSH: 1.46 u[IU]/mL (ref 0.35–4.50)

## 2019-01-11 DIAGNOSIS — J301 Allergic rhinitis due to pollen: Secondary | ICD-10-CM | POA: Diagnosis not present

## 2019-01-11 DIAGNOSIS — J3081 Allergic rhinitis due to animal (cat) (dog) hair and dander: Secondary | ICD-10-CM | POA: Diagnosis not present

## 2019-01-11 DIAGNOSIS — J3089 Other allergic rhinitis: Secondary | ICD-10-CM | POA: Diagnosis not present

## 2019-01-19 DIAGNOSIS — J3089 Other allergic rhinitis: Secondary | ICD-10-CM | POA: Diagnosis not present

## 2019-01-19 DIAGNOSIS — J3081 Allergic rhinitis due to animal (cat) (dog) hair and dander: Secondary | ICD-10-CM | POA: Diagnosis not present

## 2019-01-19 DIAGNOSIS — J301 Allergic rhinitis due to pollen: Secondary | ICD-10-CM | POA: Diagnosis not present

## 2019-01-24 ENCOUNTER — Encounter: Payer: Self-pay | Admitting: Family Medicine

## 2019-01-27 ENCOUNTER — Ambulatory Visit (INDEPENDENT_AMBULATORY_CARE_PROVIDER_SITE_OTHER): Payer: Medicare HMO

## 2019-01-27 ENCOUNTER — Other Ambulatory Visit: Payer: Self-pay

## 2019-01-27 DIAGNOSIS — Z23 Encounter for immunization: Secondary | ICD-10-CM | POA: Diagnosis not present

## 2019-01-27 DIAGNOSIS — J3081 Allergic rhinitis due to animal (cat) (dog) hair and dander: Secondary | ICD-10-CM | POA: Diagnosis not present

## 2019-01-27 DIAGNOSIS — J301 Allergic rhinitis due to pollen: Secondary | ICD-10-CM | POA: Diagnosis not present

## 2019-01-27 DIAGNOSIS — J3089 Other allergic rhinitis: Secondary | ICD-10-CM | POA: Diagnosis not present

## 2019-02-01 DIAGNOSIS — J3089 Other allergic rhinitis: Secondary | ICD-10-CM | POA: Diagnosis not present

## 2019-02-01 DIAGNOSIS — J301 Allergic rhinitis due to pollen: Secondary | ICD-10-CM | POA: Diagnosis not present

## 2019-02-01 DIAGNOSIS — J3081 Allergic rhinitis due to animal (cat) (dog) hair and dander: Secondary | ICD-10-CM | POA: Diagnosis not present

## 2019-02-15 DIAGNOSIS — J3089 Other allergic rhinitis: Secondary | ICD-10-CM | POA: Diagnosis not present

## 2019-02-15 DIAGNOSIS — J301 Allergic rhinitis due to pollen: Secondary | ICD-10-CM | POA: Diagnosis not present

## 2019-02-15 DIAGNOSIS — J3081 Allergic rhinitis due to animal (cat) (dog) hair and dander: Secondary | ICD-10-CM | POA: Diagnosis not present

## 2019-02-18 DIAGNOSIS — J301 Allergic rhinitis due to pollen: Secondary | ICD-10-CM | POA: Diagnosis not present

## 2019-02-18 DIAGNOSIS — J3081 Allergic rhinitis due to animal (cat) (dog) hair and dander: Secondary | ICD-10-CM | POA: Diagnosis not present

## 2019-02-18 DIAGNOSIS — J3089 Other allergic rhinitis: Secondary | ICD-10-CM | POA: Diagnosis not present

## 2019-02-20 ENCOUNTER — Other Ambulatory Visit: Payer: Self-pay | Admitting: Internal Medicine

## 2019-03-04 DIAGNOSIS — J3089 Other allergic rhinitis: Secondary | ICD-10-CM | POA: Diagnosis not present

## 2019-03-04 DIAGNOSIS — J3081 Allergic rhinitis due to animal (cat) (dog) hair and dander: Secondary | ICD-10-CM | POA: Diagnosis not present

## 2019-03-04 DIAGNOSIS — J301 Allergic rhinitis due to pollen: Secondary | ICD-10-CM | POA: Diagnosis not present

## 2019-03-09 DIAGNOSIS — J301 Allergic rhinitis due to pollen: Secondary | ICD-10-CM | POA: Diagnosis not present

## 2019-03-09 DIAGNOSIS — J3089 Other allergic rhinitis: Secondary | ICD-10-CM | POA: Diagnosis not present

## 2019-03-09 DIAGNOSIS — J3081 Allergic rhinitis due to animal (cat) (dog) hair and dander: Secondary | ICD-10-CM | POA: Diagnosis not present

## 2019-03-15 DIAGNOSIS — J3081 Allergic rhinitis due to animal (cat) (dog) hair and dander: Secondary | ICD-10-CM | POA: Diagnosis not present

## 2019-03-15 DIAGNOSIS — J301 Allergic rhinitis due to pollen: Secondary | ICD-10-CM | POA: Diagnosis not present

## 2019-03-15 DIAGNOSIS — J3089 Other allergic rhinitis: Secondary | ICD-10-CM | POA: Diagnosis not present

## 2019-03-21 ENCOUNTER — Telehealth (INDEPENDENT_AMBULATORY_CARE_PROVIDER_SITE_OTHER): Payer: Medicare HMO | Admitting: Family Medicine

## 2019-03-21 ENCOUNTER — Other Ambulatory Visit: Payer: Self-pay

## 2019-03-21 ENCOUNTER — Encounter: Payer: Self-pay | Admitting: Family Medicine

## 2019-03-21 VITALS — BP 148/80 | HR 83 | Temp 97.7°F | Ht 65.0 in | Wt 235.0 lb

## 2019-03-21 DIAGNOSIS — R110 Nausea: Secondary | ICD-10-CM | POA: Diagnosis not present

## 2019-03-21 MED ORDER — ESOMEPRAZOLE MAGNESIUM 20 MG PO CPDR: 40.0000 mg | DELAYED_RELEASE_CAPSULE | Freq: Every day | ORAL | 0 refills | Status: DC

## 2019-03-21 NOTE — Progress Notes (Signed)
Virtual Visit via Video Note  I connected with the patient on 03/21/19 at  1:45 PM EST by a video enabled telemedicine application and verified that I am speaking with the correct person using two identifiers.  Location patient: home Location provider:work or home office Persons participating in the virtual visit: patient, provider  I discussed the limitations of evaluation and management by telemedicine and the availability of in person appointments. The patient expressed understanding and agreed to proceed.   HPI: Here initially to complain of frequent nausea for the past week. After we talked awhile it became clear her nausea has been a chronic issue for years. She has seen Dr. Havery Moros in GI about it but no etiology was found. She takes Nexium 20 mg daily at bedtime, but she admits to frequent burning pains in the chest and in the back of the throat. No trouble swallowing. No vomiting. Appetite is good. No SOB or coughing. No recent medication changes.    ROS: See pertinent positives and negatives per HPI.  Past Medical History:  Diagnosis Date  . Allergy   . Anxiety and depression 12/19/2009  . Arthritis   . Cancer (HCC)    vaginal  . Constipation   . Degenerative disorder of eye   . Diverticulosis   . Essential hypertension, benign 06/26/2009  . GERD 05/27/2007   takes Nexium daily  . Heart murmur    yrs ago  . History of gout   . History of migraine many yrs ago  . Hyperlipidemia    takes Pravastatin daily  . Hypothyroidism   . Insomnia    but doesn't take any meds  . LOW BACK PAIN 05/27/2007  . Numbness    left toes   . Osteoporosis   . Seizures ([place])    last 1983, none since then- last seizure 1988 per pt     Past Surgical History:  Procedure Laterality Date  . ADENOIDECTOMY    . BACK SURGERY     lumbar x2  . COLONOSCOPY  2009  . ESOPHAGOGASTRODUODENOSCOPY    . Heel spurs Bilateral   . KNEE ARTHROPLASTY Left 11/07/2013   Procedure: COMPUTER ASSISTED TOTAL  KNEE ARTHROPLASTY;  Surgeon: Marybelle Killings, MD;  Location: [facility];  Service: Orthopedics;  Laterality: Left;  Left Total Knee Arthroplasty, Cemented, Computer Assist  . TONSILLECTOMY    . TOTAL KNEE REVISION Left 09/19/2014   Procedure: LEFT TOTAL KNEE REVISION;  Surgeon: Leandrew Koyanagi, MD;  Location: [facility];  Service: Orthopedics;  Laterality: Left;  . UPPER GASTROINTESTINAL ENDOSCOPY      Family History  Problem Relation Age of Onset  . Aortic dissection Father   . Arthritis Mother   . Stroke Mother 60  . Heart murmur Other   . Colon cancer Neg Hx   . Esophageal cancer Neg Hx   . Stomach cancer Neg Hx   . Rectal cancer Neg Hx   . Colon polyps Neg Hx      Current Outpatient Medications:  .  aspirin 81 MG tablet, Take 81 mg by mouth daily., Disp: , Rfl:  .  beta carotene w/minerals (OCUVITE) tablet, Take 1 tablet by mouth daily., Disp: , Rfl:  .  Cholecalciferol (VITAMIN D3 PO), Take by mouth., Disp: , Rfl:  .  Cyanocobalamin (VITAMIN B 12 PO), Take by mouth daily.  , Disp: , Rfl:  .  cyclobenzaprine (FLEXERIL) 10 MG tablet, Take 1 tablet (10 mg total) by mouth 2 (two) times daily as  needed for muscle spasms., Disp: 30 tablet, Rfl: 0 .  dicyclomine (BENTYL) 10 MG capsule, Take 1 capsule (10 mg total) by mouth every 8 (eight) hours as needed for spasms., Disp: 30 capsule, Rfl: 3 .  esomeprazole (NEXIUM) 20 MG capsule, Take 20 mg by mouth daily before breakfast., Disp: , Rfl:  .  fluticasone (FLONASE) 50 MCG/ACT nasal spray, Place 2 sprays into both nostrils daily. (Patient taking differently: Place 2 sprays into both nostrils daily as needed. ), Disp: 16 g, Rfl: 6 .  levocetirizine (XYZAL) 5 MG tablet, Take by mouth daily. , Disp: , Rfl:  .  levothyroxine (SYNTHROID) 88 MCG tablet, TAKE 1 TABLET BY MOUTH EVERY DAY, Disp: 90 tablet, Rfl: 1 .  lisinopril (ZESTRIL) 5 MG tablet, TAKE 1 TABLET BY MOUTH EVERY DAY, Disp: 90 tablet, Rfl: 1 .  methocarbamol (ROBAXIN) 500 MG tablet, Take 1 tablet  (500 mg total) by mouth 4 (four) times daily. (Patient taking differently: Take 500 mg by mouth every 6 (six) hours as needed. ), Disp: 30 tablet, Rfl: 0 .  ondansetron (ZOFRAN ODT) 4 MG disintegrating tablet, Take 1 tablet (4 mg total) by mouth every 6 (six) hours as needed for nausea or vomiting., Disp: 30 tablet, Rfl: 3 .  pravastatin (PRAVACHOL) 20 MG tablet, TAKE 1 TABLET BY MOUTH EVERY DAY, Disp: 90 tablet, Rfl: 1 .  sertraline (ZOLOFT) 100 MG tablet, TAKE 1 TABLET (100 MG TOTAL) BY MOUTH DAILY. TAKE WITH 25MG =125MG , Disp: 90 tablet, Rfl: 1 .  sertraline (ZOLOFT) 25 MG tablet, TAKE 1 TABLET (25 MG TOTAL) BY MOUTH DAILY. TAKE WITH 100MG =125MG ., Disp: 90 tablet, Rfl: 1 .  zonisamide (ZONEGRAN) 100 MG capsule, Take 200 mg by mouth 2 (two) times daily., Disp: , Rfl:   Current Facility-Administered Medications:  .  methylPREDNISolone acetate (DEPO-MEDROL) injection 40 mg, 40 mg, Intra-articular, Once, Hilts, Michael, MD  EXAM:  VITALS per patient if applicable:  GENERAL: alert, oriented, appears well and in no acute distress  HEENT: atraumatic, conjunttiva clear, no obvious abnormalities on inspection of external nose and ears  NECK: normal movements of the head and neck  LUNGS: on inspection no signs of respiratory distress, breathing rate appears normal, no obvious gross SOB, gasping or wheezing  CV: no obvious cyanosis  MS: moves all visible extremities without noticeable abnormality  PSYCH/NEURO: pleasant and cooperative, no obvious depression or anxiety, speech and thought processing grossly intact  ASSESSMENT AND PLAN: Chronic nausea that is likely related to GERD. I asked her change her dosing of Nexium to taking it in the mornings before breakfast, and she will increase the dose to 2 capsules (40 mg) each morning. Recheck as needed.  Alysia Penna, MD  Discussed the following assessment and plan:  No diagnosis found.     I discussed the assessment and treatment plan with  the patient. The patient was provided an opportunity to ask questions and all were answered. The patient agreed with the plan and demonstrated an understanding of the instructions.   The patient was advised to call back or seek an in-person evaluation if the symptoms worsen or if the condition fails to improve as anticipated.

## 2019-03-21 NOTE — Addendum Note (Signed)
Addended by: Alysia Penna A on: 03/21/2019 02:12 PM   Modules accepted: Orders

## 2019-03-21 NOTE — Patient Instructions (Signed)
There are no preventive care reminders to display for this patient.  Depression screen [facility] 2/9 06/17/2018 03/15/2018 11/12/2017  Decreased Interest 0 0 0  Down, Depressed, Hopeless 0 0 0  PHQ - 2 Score 0 0 0  Altered sleeping 0 0 -  Tired, decreased energy 3 3 -  Change in appetite 0 0 -  Feeling bad or failure about yourself  0 0 -  Trouble concentrating 0 0 -  Moving slowly or fidgety/restless 0 0 -  Suicidal thoughts 0 0 -  PHQ-9 Score 3 3 -  Some recent data might be hidden

## 2019-03-24 ENCOUNTER — Other Ambulatory Visit: Payer: Self-pay

## 2019-03-24 ENCOUNTER — Ambulatory Visit: Payer: Medicare HMO | Admitting: Internal Medicine

## 2019-03-24 ENCOUNTER — Telehealth (INDEPENDENT_AMBULATORY_CARE_PROVIDER_SITE_OTHER): Payer: Medicare HMO | Admitting: Family Medicine

## 2019-03-24 ENCOUNTER — Other Ambulatory Visit (INDEPENDENT_AMBULATORY_CARE_PROVIDER_SITE_OTHER): Payer: Medicare HMO

## 2019-03-24 DIAGNOSIS — R109 Unspecified abdominal pain: Secondary | ICD-10-CM

## 2019-03-24 DIAGNOSIS — K219 Gastro-esophageal reflux disease without esophagitis: Secondary | ICD-10-CM

## 2019-03-24 DIAGNOSIS — R110 Nausea: Secondary | ICD-10-CM

## 2019-03-24 LAB — COMPREHENSIVE METABOLIC PANEL
ALT: 10 U/L (ref 0–35)
AST: 14 U/L (ref 0–37)
Albumin: 4.1 g/dL (ref 3.5–5.2)
Alkaline Phosphatase: 95 U/L (ref 39–117)
BUN: 13 mg/dL (ref 6–23)
CO2: 29 mEq/L (ref 19–32)
Calcium: 9.3 mg/dL (ref 8.4–10.5)
Chloride: 106 mEq/L (ref 96–112)
Creatinine, Ser: 0.87 mg/dL (ref 0.40–1.20)
GFR: 64.16 mL/min (ref >60.00)
Glucose, Bld: 82 mg/dL (ref 70–99)
Potassium: 4.3 mEq/L (ref 3.5–5.1)
Sodium: 142 mEq/L (ref 135–145)
Total Bilirubin: 0.4 mg/dL (ref 0.2–1.2)
Total Protein: 6.9 g/dL (ref 6.0–8.3)

## 2019-03-24 LAB — CBC
HCT: 41.8 % (ref 36.0–46.0)
Hemoglobin: 13.8 g/dL (ref 12.0–15.0)
MCHC: 33.1 g/dL (ref 30.0–36.0)
MCV: 95.2 fl (ref 78.0–100.0)
Platelets: 215 10*3/uL (ref 150.0–400.0)
RBC: 4.4 Mil/uL (ref 3.87–5.11)
RDW: 13.3 % (ref 11.5–15.5)
WBC: 8.5 10*3/uL (ref 4.0–10.5)

## 2019-03-24 LAB — LIPASE: Lipase: 22 U/L (ref 11.0–59.0)

## 2019-03-24 NOTE — Progress Notes (Signed)
Virtual Visit via Video Note  I connected with Beverly Ballard  on 03/24/19 at 11:00 AM EST by a video enabled telemedicine application and verified that I am speaking with the correct person using two identifiers.  Location patient: home Location provider:work or home office Persons participating in the virtual visit: patient, provider  I discussed the limitations of evaluation and management by telemedicine and the availability of in person appointments. The patient expressed understanding and agreed to proceed.   HPI:  Acute visit for nausea: -this is a chronic intermittent issue -restarted the last 2 weeks, feels tired, feels nauseous all the time, decreased appetite, feels like something is wrong in her stomach -denies fever, vomiting, diarrhea, diarrhea -she does have issues with constipation -nausea increased when she eats or drinks -sees GI and has seen them for this in the past, on nexium, she has doubled up on the nexium the last few days and it helps some, but not great -she is struggling with some nausea -has not been around anyone and no known sick contacts  ROS: See pertinent positives and negatives per HPI.  Past Medical History:  Diagnosis Date  . Allergy   . Anxiety and depression 12/19/2009  . Arthritis   . Cancer (HCC)    vaginal  . Constipation   . Degenerative disorder of eye   . Diverticulosis   . Essential hypertension, benign 06/26/2009  . GERD 05/27/2007   takes Nexium daily  . Heart murmur    yrs ago  . History of gout   . History of migraine many yrs ago  . Hyperlipidemia    takes Pravastatin daily  . Hypothyroidism   . Insomnia    but doesn't take any meds  . LOW BACK PAIN 05/27/2007  . Numbness    left toes   . Osteoporosis   . Seizures ([place])    last 1983, none since then- last seizure 1988 per pt     Past Surgical History:  Procedure Laterality Date  . ADENOIDECTOMY    . BACK SURGERY     lumbar x2  . COLONOSCOPY  2009  .  ESOPHAGOGASTRODUODENOSCOPY    . Heel spurs Bilateral   . KNEE ARTHROPLASTY Left 11/07/2013   Procedure: COMPUTER ASSISTED TOTAL KNEE ARTHROPLASTY;  Surgeon: Marybelle Killings, MD;  Location: [facility];  Service: Orthopedics;  Laterality: Left;  Left Total Knee Arthroplasty, Cemented, Computer Assist  . TONSILLECTOMY    . TOTAL KNEE REVISION Left 09/19/2014   Procedure: LEFT TOTAL KNEE REVISION;  Surgeon: Leandrew Koyanagi, MD;  Location: [facility];  Service: Orthopedics;  Laterality: Left;  . UPPER GASTROINTESTINAL ENDOSCOPY      Family History  Problem Relation Age of Onset  . Aortic dissection Father   . Arthritis Mother   . Stroke Mother 34  . Heart murmur Other   . Colon cancer Neg Hx   . Esophageal cancer Neg Hx   . Stomach cancer Neg Hx   . Rectal cancer Neg Hx   . Colon polyps Neg Hx     SOCIAL HX: see hpi   Current Outpatient Medications:  .  aspirin 81 MG tablet, Take 81 mg by mouth daily., Disp: , Rfl:  .  beta carotene w/minerals (OCUVITE) tablet, Take 1 tablet by mouth daily., Disp: , Rfl:  .  Cholecalciferol (VITAMIN D3 PO), Take by mouth., Disp: , Rfl:  .  Cyanocobalamin (VITAMIN B 12 PO), Take by mouth daily.  , Disp: , Rfl:  .  cyclobenzaprine (FLEXERIL) 10 MG tablet, Take 1 tablet (10 mg total) by mouth 2 (two) times daily as needed for muscle spasms., Disp: 30 tablet, Rfl: 0 .  dicyclomine (BENTYL) 10 MG capsule, Take 1 capsule (10 mg total) by mouth every 8 (eight) hours as needed for spasms., Disp: 30 capsule, Rfl: 3 .  esomeprazole (NEXIUM) 20 MG capsule, Take 2 capsules (40 mg total) by mouth daily before breakfast., Disp: 2 capsule, Rfl: 0 .  fluticasone (FLONASE) 50 MCG/ACT nasal spray, Place 2 sprays into both nostrils daily. (Patient taking differently: Place 2 sprays into both nostrils daily as needed. ), Disp: 16 g, Rfl: 6 .  levocetirizine (XYZAL) 5 MG tablet, Take by mouth daily. , Disp: , Rfl:  .  levothyroxine (SYNTHROID) 88 MCG tablet, TAKE 1 TABLET BY MOUTH EVERY  DAY, Disp: 90 tablet, Rfl: 1 .  lisinopril (ZESTRIL) 5 MG tablet, TAKE 1 TABLET BY MOUTH EVERY DAY, Disp: 90 tablet, Rfl: 1 .  methocarbamol (ROBAXIN) 500 MG tablet, Take 1 tablet (500 mg total) by mouth 4 (four) times daily. (Patient taking differently: Take 500 mg by mouth every 6 (six) hours as needed. ), Disp: 30 tablet, Rfl: 0 .  ondansetron (ZOFRAN ODT) 4 MG disintegrating tablet, Take 1 tablet (4 mg total) by mouth every 6 (six) hours as needed for nausea or vomiting., Disp: 30 tablet, Rfl: 3 .  pravastatin (PRAVACHOL) 20 MG tablet, TAKE 1 TABLET BY MOUTH EVERY DAY, Disp: 90 tablet, Rfl: 1 .  sertraline (ZOLOFT) 100 MG tablet, TAKE 1 TABLET (100 MG TOTAL) BY MOUTH DAILY. TAKE WITH 25MG =125MG , Disp: 90 tablet, Rfl: 1 .  sertraline (ZOLOFT) 25 MG tablet, TAKE 1 TABLET (25 MG TOTAL) BY MOUTH DAILY. TAKE WITH 100MG =125MG ., Disp: 90 tablet, Rfl: 1 .  zonisamide (ZONEGRAN) 100 MG capsule, Take 200 mg by mouth 2 (two) times daily., Disp: , Rfl:   Current Facility-Administered Medications:  .  methylPREDNISolone acetate (DEPO-MEDROL) injection 40 mg, 40 mg, Intra-articular, Once, Hilts, Michael, MD  EXAM:  VITALS per patient if applicable: denies fever T 98.7  GENERAL: alert, oriented, appears well and in no acute distress  HEENT: atraumatic, conjunttiva clear, no obvious abnormalities on inspection of external nose and ears, moist mucus membranes on video visit exam of oropharynx  NECK: normal movements of the head and neck  LUNGS: on inspection no signs of respiratory distress, breathing rate appears normal, no obvious gross SOB, gasping or wheezing  CV: no obvious cyanosis  ABD: on video visit exam had patient palpate entire abdomen, she mentioned mild discomfort with palpation in the epigastric regions, she reports abd is soft, had her jump and this did not cause any pain or symptoms  MS: moves all visible extremities without noticeable abnormality  PSYCH/NEURO: pleasant and  cooperative, no obvious depression or anxiety, speech and thought processing grossly intact  ASSESSMENT AND PLAN:  Discussed the following assessment and plan:  Nausea - Plan: CBC (no diff), CMP, Lipase  Gastroesophageal reflux disease, unspecified whether esophagitis present  Abdominal pain, unspecified abdominal location  -we discussed possible serious and likely etiologies, options for evaluation and workup, limitations of telemedicine visit vs in person visit, treatment, treatment risks and precautions. Pt prefers to treat via telemedicine empirically rather then risking or undertaking an in person visit at this moment. She has had long term issues with nausea and sees GI for this. They rxed some zofran for her over the summer, but she had not tried it. She will pick this  up and try. Also she agrees to call gi right after her visit with me to update them of her symptoms and new does of nexium. We also advise ensuring enough fluids and suggested trying some ginger and/or lemon in her water and small meals, avoiding dairy and red meat. We will also grab some labs to ensure nothing new has popped up since her last labs. Remotely she had hyponatremia and nausea from some of her pscyh/neuro meds and these had to be adjusted by her specialists at that time, now back on them. Patient agrees to seek prompt in person care if worsening, new symptoms arise, or if is not improving with treatment. Advised follow up with me or PCP in 2-3 weeks.   I discussed the assessment and treatment plan with the patient. The patient was provided an opportunity to ask questions and all were answered. The patient agreed with the plan and demonstrated an understanding of the instructions.   The patient was advised to call back or seek an in-person evaluation if the symptoms worsen or if the condition fails to improve as anticipated.   Lucretia Kern, DO   Patient Instructions  -get the zofran prescription and can use  this as needed per instructions. This can cause some constipation so use dialy fiber supplement and/or mirilax if needed.  -Call your Gastroenterologist today about your symptoms  -we need to get some lab work, I put in orders, please schedule a lab visit  -sip fluids all day long, can add slices of fresh ginger and lemon to your water, this can help  -Avoid dairy and red meat  -drink fluids with electrolytes if not eating - soup broth or Pedialyte

## 2019-03-24 NOTE — Patient Instructions (Signed)
-  get the zofran prescription and can use this as needed per instructions. This can cause some constipation so use dialy fiber supplement and/or mirilax if needed.  -Call your Gastroenterologist today about your symptoms  -we need to get some lab work, I put in orders, please schedule a lab visit  -sip fluids all day long, can add slices of fresh ginger and lemon to your water, this can help  -Avoid dairy and red meat  -drink fluids with electrolytes if not eating - soup broth or Pedialyte

## 2019-03-29 ENCOUNTER — Other Ambulatory Visit: Payer: Self-pay

## 2019-03-30 ENCOUNTER — Ambulatory Visit (INDEPENDENT_AMBULATORY_CARE_PROVIDER_SITE_OTHER): Payer: Medicare HMO | Admitting: Family Medicine

## 2019-03-30 ENCOUNTER — Encounter: Payer: Self-pay | Admitting: Family Medicine

## 2019-03-30 ENCOUNTER — Ambulatory Visit (INDEPENDENT_AMBULATORY_CARE_PROVIDER_SITE_OTHER): Payer: Medicare HMO

## 2019-03-30 VITALS — BP 122/52 | HR 101 | Temp 97.3°F | Ht 65.0 in | Wt 232.0 lb

## 2019-03-30 DIAGNOSIS — G40909 Epilepsy, unspecified, not intractable, without status epilepticus: Secondary | ICD-10-CM

## 2019-03-30 DIAGNOSIS — R300 Dysuria: Secondary | ICD-10-CM | POA: Diagnosis not present

## 2019-03-30 DIAGNOSIS — E785 Hyperlipidemia, unspecified: Secondary | ICD-10-CM

## 2019-03-30 DIAGNOSIS — R109 Unspecified abdominal pain: Secondary | ICD-10-CM | POA: Diagnosis not present

## 2019-03-30 DIAGNOSIS — K219 Gastro-esophageal reflux disease without esophagitis: Secondary | ICD-10-CM | POA: Diagnosis not present

## 2019-03-30 DIAGNOSIS — I10 Essential (primary) hypertension: Secondary | ICD-10-CM

## 2019-03-30 DIAGNOSIS — R062 Wheezing: Secondary | ICD-10-CM | POA: Diagnosis not present

## 2019-03-30 DIAGNOSIS — J309 Allergic rhinitis, unspecified: Secondary | ICD-10-CM | POA: Diagnosis not present

## 2019-03-30 DIAGNOSIS — E890 Postprocedural hypothyroidism: Secondary | ICD-10-CM

## 2019-03-30 DIAGNOSIS — R110 Nausea: Secondary | ICD-10-CM | POA: Diagnosis not present

## 2019-03-30 DIAGNOSIS — R05 Cough: Secondary | ICD-10-CM | POA: Diagnosis not present

## 2019-03-30 LAB — URINALYSIS, ROUTINE W REFLEX MICROSCOPIC
Bilirubin Urine: NEGATIVE
Hgb urine dipstick: NEGATIVE
Ketones, ur: NEGATIVE
Nitrite: NEGATIVE
RBC / HPF: NONE SEEN (ref 0–?)
Specific Gravity, Urine: 1.015 (ref 1.000–1.030)
Total Protein, Urine: NEGATIVE
Urine Glucose: NEGATIVE
Urobilinogen, UA: 0.2 (ref 0.0–1.0)
pH: 6 (ref 5.0–8.0)

## 2019-03-30 MED ORDER — ONDANSETRON 4 MG PO TBDP: 4.0000 mg | ORAL_TABLET | Freq: Four times a day (QID) | ORAL | 3 refills | Status: DC | PRN

## 2019-03-30 MED ORDER — ONDANSETRON HCL 4 MG/2ML IJ SOLN
4.0000 mg | Freq: Once | INTRAMUSCULAR | Status: AC
  Administered 2019-03-30: 4 mg via INTRAMUSCULAR

## 2019-03-30 NOTE — Progress Notes (Signed)
Beverly Ballard DOB: Sep 03, 1945 Encounter date: 03/30/2019  This is a 73 y.o. female who presents with Chief Complaint  Patient presents with  . Follow-up    History of present illness:  Recent visit with Dr. Maudie Mercury and Dr. Sarajane Jews for nausea. Has suffered with nausea on and off since 2017. Follows with Dr. Havery Moros and has been evaluated but they can't find cause. Sometimes gets a bad spell of it where she can't eat or drink. Any time she tries to put food in mouth she gets worse. Just can't do anything. Just tries to sleep, but gets to point where she can't sleep. Just aggravating for her. For last 2 nights has been able to get some food in her. Starting to drink electrolyte fluid. Working on drinking water with lemon. Just really tired, drained. Spoke with Dr. Maudie Mercury and had bloodwork and everything looked ok - cbc, cmp, lipase. Tries to eat ginger cookies. Feels like reflux might play a role as well. Not taking zofran. Didn't have it. Doesn't remember if it helped before. Taking fiber daily. Does usually have bm every other day. No diarrhea. No vomiting. Gets some pain in upper belly and then some flank pain in back. No fevers. Belly pain is similar to her chronic pain. Once she had it more painful. More intense pain, scared her. More lower abd. Lasted about 5 minutes. Bad enough she mentioned to her husband.   Last day or so noted that more difficult to urinate. This is different for her. Sometimes feels like burning somewhat.   Had blood work in September and December.  HTN: this morning was 115/76. On lisinopril 5mg  daily.  GERD:on nexium for this. Occasional symptoms, but doubles up for a week and then it resolves.  Hypothyroid:synthroid 60mcg daily. Follows with Dr. Cruzita Lederer.  Seizure disorder: last seizure 1988. Does still follow with neurology in [place].  Osteopenia: followed by gyn.  Allergies: getting allergy shot.   HL:on pravastatin 20mg  daily. No side effects with medication.    Depression/Fatigue: feeling more tired lately. Has this on and off. Not sure if related to depression. A little more depressed than normal; feels this is related to COVID/not volunteering. zoloft 125mg . Now just going out to get groceries she ordered online. Does still talk with Dr. Glennon Hamilton - who is going on maternity leave in October. Also follows with Dr. Adelene Idler who gives her the zoloft. Has been staying busy with canning - beats, pickles, tomato sauce, making masks/doing crafts. Martin Majestic out with daughter one day and did painting. Talks with mom on facebook (mother in [place]). 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 Previous note on depression/fatigue was from last visit, but mood has not been good with her not feeling well. Really misses her volunteering.  IBS: was put on abx that took away sx. Cost her $400 and when she stopped then sx came back. Follows with dr. Havery Moros. Still has a lot of pain. Just lives with sx. Has had these sx for years.   Joints still bother her. Knee hasn't been right since second replacement.   Hasn't felt well since coming back from [place] last year when she got pneumonia. Still states this is true.   Follows with Dr. Benjie Karvonen for gynecology.    Allergies  Allergen Reactions  . Lamotrigine Swelling    Tongue swelling  . Bupropion Other (See Comments)    not known  . Carbamazepine Other (See Comments)  Hypnatremia  . Levetiracetam     Other reaction(s): Dizziness (intolerance)  . Tramadol Other (See Comments)    not known  . Adhesive [Tape]     ?  Blister after knee surgery  . Clarithromycin     Unsure of reaction    . Doxycycline     Interfered with seizure medication  . Nickel     Had to remove knee implant with nickel and replace it  . Other     Metal and perfumes  . Estradiol Rash  . Latex Rash  . Phenytoin Sodium Extended Other (See Comments)    drowsiness    Current Meds  Medication Sig  . aspirin 81 MG tablet Take 81 mg by mouth daily.  . beta carotene w/minerals (OCUVITE) tablet Take 1 tablet by mouth daily.  . Cholecalciferol (VITAMIN D3 PO) Take by mouth.  . Cyanocobalamin (VITAMIN B 12 PO) Take by mouth daily.    . cyclobenzaprine (FLEXERIL) 10 MG tablet Take 1 tablet (10 mg total) by mouth 2 (two) times daily as needed for muscle spasms.  Marland Kitchen dicyclomine (BENTYL) 10 MG capsule Take 1 capsule (10 mg total) by mouth every 8 (eight) hours as needed for spasms.  Marland Kitchen esomeprazole (NEXIUM) 20 MG capsule Take 2 capsules (40 mg total) by mouth daily before breakfast.  . fluticasone (FLONASE) 50 MCG/ACT nasal spray Place 2 sprays into both nostrils daily. (Patient taking differently: Place 2 sprays into both nostrils daily as needed. )  . levocetirizine (XYZAL) 5 MG tablet Take by mouth daily.   Marland Kitchen levothyroxine (SYNTHROID) 88 MCG tablet TAKE 1 TABLET BY MOUTH EVERY DAY  . lisinopril (ZESTRIL) 5 MG tablet TAKE 1 TABLET BY MOUTH EVERY DAY  . methocarbamol (ROBAXIN) 500 MG tablet Take 1 tablet (500 mg total) by mouth 4 (four) times daily. (Patient taking differently: Take 500 mg by mouth every 6 (six) hours as needed. )  . ondansetron (ZOFRAN ODT) 4 MG disintegrating tablet Take 1 tablet (4 mg total) by mouth every 6 (six) hours as needed for nausea or vomiting.  . pravastatin (PRAVACHOL) 20 MG tablet TAKE 1 TABLET BY MOUTH EVERY DAY  . sertraline (ZOLOFT) 100 MG tablet TAKE 1 TABLET (100 MG TOTAL) BY MOUTH DAILY. TAKE WITH 25MG =125MG   . sertraline (ZOLOFT) 25 MG tablet TAKE 1 TABLET (25 MG TOTAL) BY MOUTH DAILY. TAKE WITH 100MG =125MG .  . zonisamide (ZONEGRAN) 100 MG capsule Take 200 mg by mouth 2 (two) times daily.   Current Facility-Administered Medications for the 03/30/19 encounter (Office Visit) with Caren Macadam, MD  Medication  . methylPREDNISolone acetate (DEPO-MEDROL) injection 40 mg    Review of Systems  Constitutional: Positive for  fatigue. Negative for chills and fever.  Respiratory: Positive for cough, shortness of breath and wheezing. Negative for chest tightness.   Cardiovascular: Negative for chest pain, palpitations and leg swelling.  Gastrointestinal: Positive for abdominal distention, abdominal pain (at baseline discomfort today) and nausea. Negative for diarrhea and vomiting.    Objective:  BP (!) 122/52 (BP Location: Left Arm, Patient Position: Sitting, Cuff Size: Large)   Pulse (!) 101   Temp (!) 97.3 F (36.3 C) (Temporal)   Ht 5\' 5"  (1.651 m)   Wt 232 lb (105.2 kg)   SpO2 96%   BMI 38.61 kg/m   Weight: 232 lb (105.2 kg)   BP Readings from Last 3 Encounters:  03/30/19 (!) 122/52  03/21/19 (!) 148/80  12/28/18 121/78   Wt Readings from Last 3 Encounters:  03/30/19 232 lb (105.2 kg)  03/21/19 235 lb (106.6 kg)  12/28/18 230 lb (104.3 kg)    Physical Exam Constitutional:      General: She is not in acute distress.    Appearance: She is well-developed.  Cardiovascular:     Rate and Rhythm: Normal rate and regular rhythm.     Heart sounds: Normal heart sounds. No murmur. No friction rub.  Pulmonary:     Effort: Pulmonary effort is normal. No respiratory distress.     Breath sounds: Examination of the right-upper field reveals wheezing, rhonchi and rales. Examination of the left-upper field reveals wheezing. Wheezing, rhonchi and rales present.     Comments: On initial exam there is end expiratory wheeze bilateral upper lobes.  After albuterol treatment, wheeze resolves and light rhonchi/?  Subtle Rales right upper lobe.  She does note improvement in air movement after treatment. Abdominal:     General: Bowel sounds are normal.     Palpations: Abdomen is soft.     Tenderness: There is abdominal tenderness in the epigastric area. There is right CVA tenderness and left CVA tenderness.  Musculoskeletal:     Right lower leg: No edema.     Left lower leg: No edema.  Neurological:     Mental  Status: She is alert and oriented to person, place, and time.  Psychiatric:        Attention and Perception: Attention and perception normal.        Mood and Affect: Mood is depressed.        Speech: Speech normal.        Behavior: Behavior normal.        Cognition and Memory: Cognition and memory normal.     Assessment/Plan  1. Postablative hypothyroidism Continue Synthroid.  Follows with Dr. Cruzita Lederer.  2. Essential hypertension Blood pressure on lower end today.  We will plan to have her follow-up pending results from today's studies.  3. Allergic rhinitis, unspecified seasonality, unspecified trigger Chronic issue.  Does follow with allergist.  4. Hyperlipidemia, unspecified hyperlipidemia type Well-controlled with diet.  5. Gastroesophageal reflux disease, unspecified whether esophagitis present Stable with Nexium.  6. Seizure disorder [facility]) Following with neurology.  7. Nausea Zofran given in the office.  Also refilled Zofran to the pharmacy.  Encouraged her to take 3 times a day so that she can work on getting fluids and food without feeling nauseated.  We will touch base with her once we get results from evaluation today.  She does have recurrent nausea at baseline, but this has been more prolonged than usual.  Given lung exam findings and urinary symptoms, we discussed that infection could contribute to nausea. - ondansetron (ZOFRAN ODT) 4 MG disintegrating tablet; Take 1 tablet (4 mg total) by mouth every 6 (six) hours as needed for nausea or vomiting.  Dispense: 30 tablet; Refill: 3 - ondansetron (ZOFRAN) injection 4 mg  8. Abdominal pain, unspecified abdominal location Somewhat chronic.  We will also have her follow-up with GI.  9. Wheezing Resolved with albuterol.  I told her we would check in once we get chest x-ray results and if she felt improvement with albuterol we can send an inhaler for her.  Of note, she has had pulmonary function testing in the past and did  not have a large change with bronchodilator use, but as we discussed in the office, she is wheezing now and may benefit more in this specific circumstance. - DG Chest 2 View; Future -  DG Chest 2 View  10. Dysuria - Urinalysis; Future - Culture, Urine; Future - Culture, Urine - Urinalysis   Return for pending urine, cxr.    Micheline Rough, MD

## 2019-03-31 ENCOUNTER — Other Ambulatory Visit: Payer: Self-pay | Admitting: Family Medicine

## 2019-03-31 MED ORDER — LEVOFLOXACIN 750 MG PO TABS: 750.0000 mg | ORAL_TABLET | Freq: Every day | ORAL | 0 refills | Status: DC

## 2019-04-01 DIAGNOSIS — R69 Illness, unspecified: Secondary | ICD-10-CM | POA: Diagnosis not present

## 2019-04-01 LAB — URINE CULTURE
MICRO NUMBER:: 1180674
SPECIMEN QUALITY:: ADEQUATE

## 2019-04-01 MED ORDER — ALBUTEROL SULFATE HFA 108 (90 BASE) MCG/ACT IN AERS: 2.0000 | INHALATION_SPRAY | RESPIRATORY_TRACT | 0 refills | Status: DC | PRN

## 2019-04-01 MED ORDER — SPACER/AERO-HOLDING CHAMBERS DEVI: 0 refills | Status: DC

## 2019-04-01 NOTE — Addendum Note (Signed)
Addended by: Agnes Lawrence on: 04/01/2019 09:14 AM   Modules accepted: Orders

## 2019-04-06 ENCOUNTER — Other Ambulatory Visit: Payer: Self-pay

## 2019-04-06 ENCOUNTER — Telehealth (INDEPENDENT_AMBULATORY_CARE_PROVIDER_SITE_OTHER): Payer: Medicare HMO | Admitting: Family Medicine

## 2019-04-06 ENCOUNTER — Encounter: Payer: Self-pay | Admitting: Family Medicine

## 2019-04-06 VITALS — Temp 98.3°F

## 2019-04-06 DIAGNOSIS — J189 Pneumonia, unspecified organism: Secondary | ICD-10-CM | POA: Diagnosis not present

## 2019-04-06 MED ORDER — PREDNISONE 20 MG PO TABS: 40.0000 mg | ORAL_TABLET | Freq: Every day | ORAL | 0 refills | Status: AC

## 2019-04-06 NOTE — Progress Notes (Signed)
Virtual Visit via Video Note  I connected with Beverly Ballard on 04/06/19 at  2:00 PM EST by a video enabled telemedicine application and verified that I am speaking with the correct person using two identifiers.  Location patient: home Location provider:work or home office Persons participating in the virtual visit: patient, provider  I discussed the limitations of evaluation and management by telemedicine and the availability of in person appointments. The patient expressed understanding and agreed to proceed.   Beverly Ballard DOB: 12-Jul-1945 Encounter date: 04/06/2019  This is a 73 y.o. female who presents with Chief Complaint  Patient presents with  . Follow-up    pneumonia, patient states she is not feeling well today, complains of increased back pain worse since her last visit  . Fatigue    History of present illness: Energy still feels terrible. Still pain in back. Feels like back pain is worse. Breathing feels tight. Has been using inhaler. Does feel like this helps when she uses it. Has one more day of antibiotic. Back pain has moved more to center of back. Hard to sleep.   Not coughing as much.   Has had chills; not had fever. Temp was 98.3 today.  Didn't really feel better after zofran injection in the office. Still nauseated. GI appointment not until 05/02/19.   bp 106/71 with HR 98   Allergies  Allergen Reactions  . Lamotrigine Swelling    Tongue swelling  . Bupropion Other (See Comments)    not known  . Carbamazepine Other (See Comments)    Hypnatremia  . Levetiracetam     Other reaction(s): Dizziness (intolerance)  . Tramadol Other (See Comments)    not known  . Adhesive [Tape]     ?  Blister after knee surgery  . Clarithromycin     Unsure of reaction    . Doxycycline     Interfered with seizure medication  . Nickel     Had to remove knee implant with nickel and replace it  . Other     Metal and perfumes  . Estradiol Rash  . Latex Rash  .  Phenytoin Sodium Extended Other (See Comments)    drowsiness   Current Meds  Medication Sig  . albuterol (VENTOLIN HFA) 108 (90 Base) MCG/ACT inhaler Inhale 2 puffs into the lungs every 4 (four) hours as needed for wheezing or shortness of breath.  Marland Kitchen aspirin 81 MG tablet Take 81 mg by mouth daily.  . beta carotene w/minerals (OCUVITE) tablet Take 1 tablet by mouth daily.  . Cholecalciferol (VITAMIN D3 PO) Take by mouth.  . Cyanocobalamin (VITAMIN B 12 PO) Take by mouth daily.    . cyclobenzaprine (FLEXERIL) 10 MG tablet Take 1 tablet (10 mg total) by mouth 2 (two) times daily as needed for muscle spasms.  Marland Kitchen dicyclomine (BENTYL) 10 MG capsule Take 1 capsule (10 mg total) by mouth every 8 (eight) hours as needed for spasms.  Marland Kitchen esomeprazole (NEXIUM) 20 MG capsule Take 2 capsules (40 mg total) by mouth daily before breakfast.  . fluticasone (FLONASE) 50 MCG/ACT nasal spray Place 2 sprays into both nostrils daily. (Patient taking differently: Place 2 sprays into both nostrils daily as needed. )  . levocetirizine (XYZAL) 5 MG tablet Take by mouth daily.   Marland Kitchen levofloxacin (LEVAQUIN) 750 MG tablet Take 1 tablet (750 mg total) by mouth daily.  Marland Kitchen levothyroxine (SYNTHROID) 88 MCG tablet TAKE 1 TABLET BY MOUTH EVERY DAY  . lisinopril (ZESTRIL) 5 MG tablet  TAKE 1 TABLET BY MOUTH EVERY DAY  . methocarbamol (ROBAXIN) 500 MG tablet Take 1 tablet (500 mg total) by mouth 4 (four) times daily. (Patient taking differently: Take 500 mg by mouth every 6 (six) hours as needed. )  . ondansetron (ZOFRAN ODT) 4 MG disintegrating tablet Take 1 tablet (4 mg total) by mouth every 6 (six) hours as needed for nausea or vomiting.  . pravastatin (PRAVACHOL) 20 MG tablet TAKE 1 TABLET BY MOUTH EVERY DAY  . sertraline (ZOLOFT) 100 MG tablet TAKE 1 TABLET (100 MG TOTAL) BY MOUTH DAILY. TAKE WITH 25MG =125MG   . sertraline (ZOLOFT) 25 MG tablet TAKE 1 TABLET (25 MG TOTAL) BY MOUTH DAILY. TAKE WITH 100MG =125MG .  . Spacer/Aero-Holding  Dorise Bullion Use as directed with inhaler  . zonisamide (ZONEGRAN) 100 MG capsule Take 200 mg by mouth 2 (two) times daily.   Current Facility-Administered Medications for the 04/06/19 encounter (Telemedicine) with Caren Macadam, MD  Medication  . methylPREDNISolone acetate (DEPO-MEDROL) injection 40 mg    Review of Systems  Constitutional: Negative for chills, fatigue and fever.  HENT: Positive for congestion.   Respiratory: Positive for cough (improved) and shortness of breath (improves with inhaler). Negative for chest tightness and wheezing.   Cardiovascular: Negative for chest pain, palpitations and leg swelling.  Musculoskeletal: Positive for back pain.    Objective:  Temp 98.3 F (36.8 C)       BP Readings from Last 3 Encounters:  03/30/19 (!) 122/52  03/21/19 (!) 148/80  12/28/18 121/78   Wt Readings from Last 3 Encounters:  03/30/19 232 lb (105.2 kg)  03/21/19 235 lb (106.6 kg)  12/28/18 230 lb (104.3 kg)    EXAM:  GENERAL: alert, oriented, appears well and in no acute distress  HEENT: atraumatic, conjunctiva clear, no obvious abnormalities on inspection of external nose and ears  NECK: normal movements of the head and neck  LUNGS: on inspection no signs of respiratory distress, breathing rate appears normal, no obvious gross SOB, gasping or wheezing  CV: no obvious cyanosis  MS: moves all visible extremities without noticeable abnormality  PSYCH/NEURO: pleasant and cooperative, no obvious depression or anxiety, speech and thought processing grossly intact   Assessment/Plan 1. Pneumonia of right upper lobe due to infectious organism She has not yet completed antibiotic. We discussed that with true pneumonia can take some time to feel better after treatment.  In the past, she has done well with steroids to help with wheezing, so we are going to try steroid burst and will check back in with her on Friday and see how she is feeling.  We discussed  limitations with her being sick that I cannot see her back in the office, but if she is having worsening of symptoms she should proceed to urgent care or emergency room where she can be reevaluated.  We discussed consideration for Covid testing, but she feels that she really has not had any exposure where she would have contracted this.    Return if symptoms worsen or fail to improve.   I discussed the assessment and treatment plan with the patient. The patient was provided an opportunity to ask questions and all were answered. The patient agreed with the plan and demonstrated an understanding of the instructions.   The patient was advised to call back or seek an in-person evaluation if the symptoms worsen or if the condition fails to improve as anticipated.  I provided 25 minutes of non-face-to-face time during this encounter.  Micheline Rough, MD

## 2019-04-08 ENCOUNTER — Telehealth: Payer: Self-pay | Admitting: *Deleted

## 2019-04-08 NOTE — Telephone Encounter (Signed)
Spoke with the pt and informed her of the message below.  Appt scheduled for 04/27/2019 per pts preference.  Patient agreed to call for a virtual visit with a different provider prior to this appt if needed.

## 2019-04-08 NOTE — Telephone Encounter (Signed)
Spoke with the pt and she stated she is feeling a little better.  Denies a fever, completed antibiotic yesterday and still taking Prednisone.  Message sent to Dr Ethlyn Gallery.

## 2019-04-08 NOTE — Telephone Encounter (Signed)
I think with her feeling as poorly as she did when we spoke, a virtual follow up visit either with HK or myself in 1-2 weeks would be good. Glad to hear she is feeling a little better!

## 2019-04-08 NOTE — Telephone Encounter (Signed)
-----   Message from Caren Macadam, MD sent at 04/06/2019  2:19 PM EST ----- Please check in with her on Friday and give me update

## 2019-04-27 ENCOUNTER — Encounter: Payer: Self-pay | Admitting: Family Medicine

## 2019-04-27 ENCOUNTER — Other Ambulatory Visit: Payer: Self-pay

## 2019-04-27 ENCOUNTER — Telehealth (INDEPENDENT_AMBULATORY_CARE_PROVIDER_SITE_OTHER): Payer: Medicare HMO | Admitting: Family Medicine

## 2019-04-27 VITALS — BP 117/79 | HR 94 | Temp 98.2°F | Wt 232.0 lb

## 2019-04-27 DIAGNOSIS — R110 Nausea: Secondary | ICD-10-CM | POA: Diagnosis not present

## 2019-04-27 DIAGNOSIS — J189 Pneumonia, unspecified organism: Secondary | ICD-10-CM

## 2019-04-27 NOTE — Progress Notes (Addendum)
Virtual Visit via Video Note  I connected with Beverly Ballard  on 04/27/19 at  1:00 PM EST by a video enabled telemedicine application and verified that I am speaking with the correct person using two identifiers.  Location patient: home Location provider:work or home office Persons participating in the virtual visit: patient, provider  I discussed the limitations of evaluation and management by telemedicine and the availability of in person appointments. The patient expressed understanding and agreed to proceed.   Beverly Ballard DOB: 1946/03/13 Encounter date: 04/27/2019  This is a 74 y.o. female who presents with Chief Complaint  Patient presents with  . Follow-up    patient states she is feeling better    History of present illness: Still feels tired; not a lot of energy, but doing better overall.  Still gets out of breath when up and moving around. Does get benefit from inhaler.   Not coughing as much.   Back pain is better, but nausea is still present. Not really going away. Sees Dr. Havery Moros on Monday. Just feels like it is down whole throat. No reflux or burning, but just feels that nauseated sensation.   Not eating like she should. Can't eat full meal. Does get some abd discomfort after she eats and does feel bloated all the time. No vomiting. Having normal bowel movements (normal for her); just some constipation. Nausea not worse on days she doesn't have BM.  Blood pressure has been ok. Occasionally gets light headed. Usually stays in the low 120's.    Allergies  Allergen Reactions  . Lamotrigine Swelling    Tongue swelling  . Bupropion Other (See Comments)    not known  . Carbamazepine Other (See Comments)    Hypnatremia  . Levetiracetam     Other reaction(s): Dizziness (intolerance)  . Tramadol Other (See Comments)    not known  . Adhesive [Tape]     ?  Blister after knee surgery  . Clarithromycin     Unsure of reaction    . Doxycycline     Interfered  with seizure medication  . Nickel     Had to remove knee implant with nickel and replace it  . Other     Metal and perfumes  . Estradiol Rash  . Latex Rash  . Phenytoin Sodium Extended Other (See Comments)    drowsiness   Current Meds  Medication Sig  . albuterol (VENTOLIN HFA) 108 (90 Base) MCG/ACT inhaler Inhale 2 puffs into the lungs every 4 (four) hours as needed for wheezing or shortness of breath.  Marland Kitchen aspirin 81 MG tablet Take 81 mg by mouth daily.  . beta carotene w/minerals (OCUVITE) tablet Take 1 tablet by mouth daily.  . Cholecalciferol (VITAMIN D3 PO) Take by mouth.  . Cyanocobalamin (VITAMIN B 12 PO) Take by mouth daily.    . cyclobenzaprine (FLEXERIL) 10 MG tablet Take 1 tablet (10 mg total) by mouth 2 (two) times daily as needed for muscle spasms.  Marland Kitchen dicyclomine (BENTYL) 10 MG capsule Take 1 capsule (10 mg total) by mouth every 8 (eight) hours as needed for spasms.  Marland Kitchen esomeprazole (NEXIUM) 20 MG capsule Take 2 capsules (40 mg total) by mouth daily before breakfast.  . fluticasone (FLONASE) 50 MCG/ACT nasal spray Place 2 sprays into both nostrils daily. (Patient taking differently: Place 2 sprays into both nostrils daily as needed. )  . levocetirizine (XYZAL) 5 MG tablet Take by mouth daily.   Marland Kitchen levothyroxine (SYNTHROID) 88 MCG tablet  TAKE 1 TABLET BY MOUTH EVERY DAY  . lisinopril (ZESTRIL) 5 MG tablet TAKE 1 TABLET BY MOUTH EVERY DAY  . methocarbamol (ROBAXIN) 500 MG tablet Take 1 tablet (500 mg total) by mouth 4 (four) times daily. (Patient taking differently: Take 500 mg by mouth every 6 (six) hours as needed. )  . ondansetron (ZOFRAN ODT) 4 MG disintegrating tablet Take 1 tablet (4 mg total) by mouth every 6 (six) hours as needed for nausea or vomiting.  . pravastatin (PRAVACHOL) 20 MG tablet TAKE 1 TABLET BY MOUTH EVERY DAY  . sertraline (ZOLOFT) 100 MG tablet TAKE 1 TABLET (100 MG TOTAL) BY MOUTH DAILY. TAKE WITH 25MG =125MG   . sertraline (ZOLOFT) 25 MG tablet TAKE 1  TABLET (25 MG TOTAL) BY MOUTH DAILY. TAKE WITH 100MG =125MG .  . Spacer/Aero-Holding Chambers DEVI Use as directed with inhaler  . zonisamide (ZONEGRAN) 100 MG capsule Take 200 mg by mouth 2 (two) times daily.   Current Facility-Administered Medications for the 04/27/19 encounter (Telemedicine) with Caren Macadam, MD  Medication  . methylPREDNISolone acetate (DEPO-MEDROL) injection 40 mg    Review of Systems  Constitutional: Negative for chills, fatigue and fever.  Respiratory: Negative for cough, chest tightness, shortness of breath and wheezing.   Cardiovascular: Negative for chest pain, palpitations and leg swelling.  Gastrointestinal: Positive for abdominal distention, constipation and nausea. Negative for vomiting.    Objective:  BP 117/79   Pulse 94   Temp 98.2 F (36.8 C)   Wt 232 lb (105.2 kg)   SpO2 94%   BMI 38.61 kg/m   Weight: 232 lb (105.2 kg)   BP Readings from Last 3 Encounters:  04/27/19 117/79  03/30/19 (!) 122/52  03/21/19 (!) 148/80   Wt Readings from Last 3 Encounters:  04/27/19 232 lb (105.2 kg)  03/30/19 232 lb (105.2 kg)  03/21/19 235 lb (106.6 kg)    EXAM:  GENERAL: alert, oriented, appears well and in no acute distress  HEENT: atraumatic, conjunctiva clear, no obvious abnormalities on inspection of external nose and ears  NECK: normal movements of the head and neck  LUNGS: on inspection no signs of respiratory distress, breathing rate appears normal, no obvious gross SOB, gasping or wheezing  CV: no obvious cyanosis  MS: moves all visible extremities without noticeable abnormality  PSYCH/NEURO: pleasant and cooperative, no obvious depression or anxiety, speech and thought processing grossly intact   Assessment/Plan  1. Pneumonia of right upper lobe due to infectious organism She is significantly symptomatically improved.  No further evaluation is needed at this time.  2. Nausea This is been an ongoing issue for the past couple of  months.  She does have a follow-up appointment with her GI doctor next week.  I will follow along with their notes and plan.  I was hopeful that as we treated an infection she would feel better, but she continues to have nausea and decreased appetite.  Further evaluation pending GI impression.    I discussed the assessment and treatment plan with the patient. The patient was provided an opportunity to ask questions and all were answered. The patient agreed with the plan and demonstrated an understanding of the instructions.   The patient was advised to call back or seek an in-person evaluation if the symptoms worsen or if the condition fails to improve as anticipated.  I provided 15 minutes of non-face-to-face time during this encounter.  Return in about 3 months (around 07/26/2019) for Chronic condition visit.  Micheline Rough, MD

## 2019-04-28 ENCOUNTER — Telehealth: Payer: Self-pay | Admitting: *Deleted

## 2019-04-28 NOTE — Telephone Encounter (Signed)
Spoke with the pt and scheduled an appt for 4/7 to arrive at 10:15am for the 10:30am appt.

## 2019-04-28 NOTE — Telephone Encounter (Signed)
-----   Message from Caren Macadam, MD sent at 04/27/2019  1:25 PM EST ----- Please schedule CCV 30 minutes for 3 months time

## 2019-05-02 ENCOUNTER — Encounter: Payer: Self-pay | Admitting: Gastroenterology

## 2019-05-02 ENCOUNTER — Ambulatory Visit: Payer: Medicare HMO | Admitting: Gastroenterology

## 2019-05-02 VITALS — BP 124/64 | HR 85 | Temp 97.4°F | Ht 65.0 in | Wt 231.0 lb

## 2019-05-02 DIAGNOSIS — R110 Nausea: Secondary | ICD-10-CM

## 2019-05-02 DIAGNOSIS — R1310 Dysphagia, unspecified: Secondary | ICD-10-CM

## 2019-05-02 DIAGNOSIS — Z01818 Encounter for other preprocedural examination: Secondary | ICD-10-CM | POA: Diagnosis not present

## 2019-05-02 DIAGNOSIS — R109 Unspecified abdominal pain: Secondary | ICD-10-CM | POA: Diagnosis not present

## 2019-05-02 DIAGNOSIS — K219 Gastro-esophageal reflux disease without esophagitis: Secondary | ICD-10-CM | POA: Diagnosis not present

## 2019-05-02 MED ORDER — PROMETHAZINE HCL 12.5 MG PO TABS: 12.5000 mg | ORAL_TABLET | Freq: Three times a day (TID) | ORAL | 1 refills | Status: DC | PRN

## 2019-05-02 NOTE — Patient Instructions (Addendum)
If you are age 72 or older, your body mass index should be between 23-30. Your Body mass index is 38.44 kg/m. If this is out of the aforementioned range listed, please consider follow up with your Primary Care Provider.  If you are age 39 or younger, your body mass index should be between 19-25. Your Body mass index is 38.44 kg/m. If this is out of the aformentioned range listed, please consider follow up with your Primary Care Provider.   You have been scheduled for an endoscopy. Please follow written instructions given to you at your visit today. If you use inhalers (even only as needed), please bring them with you on the day of your procedure. Your physician has requested that you go to www.startemmi.com and enter the access code given to you at your visit today. This web site gives a general overview about your procedure. However, you should still follow specific instructions given to you by our office regarding your preparation for the procedure.  We have sent the following medications to your pharmacy for you to pick up at your convenience:  Phenergan 12.5mg : Take every 8 hours as needed. May want to take at night since it can cause drowsiness.  We encourage you to use Bentyl more frequently, as needed.   Thank you for entrusting me with your care and for choosing [facility], Dr. [place]rian [place] Cellar

## 2019-05-02 NOTE — Progress Notes (Signed)
HPI :  74 year old female here for follow-up visit.  I have seen her in the past for chronic nausea and abdominal discomfort.  She has a few things that have been bothering her recently.  She continues to feel nauseated all the time.  She has chronic poor appetite and frequent nausea.  She does not vomit much at all.  She has a hard time finding any clear triggers for her nausea.  She has been given Zofran to take as needed, she does not think it helps much and she ran out anyway.  She has been tried Phenergan recently that she is aware of.  She denies any postprandial complaints in regards to her nausea.  She does have ongoing dysphagia with solids that has been bothering her more so for the past 6 months.  Every time she eats she has been having solid food dysphagia.  Denies any problems with liquids.  States she feels in her throat when things get caught up going down.  She denies any upper abdominal pain.  Her bowel habits are fairly regular.  She does have some bloating and some occasional lower abdominal discomfort which is relieved with Bentyl as needed.  She takes Nexium for reflux and that controls her reflux symptoms quite well.  She had a CT scan in 2018, other than an ovarian cyst for which follow up imaging was recommended.  She had a pelvic ultrasound in June 2020 which showed the cyst was smaller in size than previous.  She has had an endoscopy in 2017 for her symptoms, she did not have any clear cause for her symptoms at that time.  Colonoscopy in 2018 did not show any concerning pathology.  She had a gastric emptying study in 2017 which was normal as well as a MRI of the brain.  I reviewed her medication list with her.  She does take zonisamide for history of seizures.  She has been on this for several years now.  No other new medications she is taking.  She otherwise had a pneumonia last year and is recovered from that.  Prior endoscopic evaluation: Colonoscopy 02/02/2017 -  The  examined portion of the ileum was normal. - Diverticulosis in the sigmoid colon. - Anal papilla(e) were hypertrophied. - The examination was otherwise normal. - No polyps  CT abdomen / pelvis 10/14/2016 - no acute pathology. Small R ovarian cyst. Pelvic US obtained which showed stable ovarian cyst, recommend surveillance in one year per radiology.   Colonoscopy 07/09/2007 - normal  EGD was done 05/07/15 for chronic nausea and for dysphagia. I did not see any evidence of pathology noted to cause dysphagia. Otherwise she didn't have any clear pathology to cause nausea on her exam. She had some benign fundic gland polyps and biopsies of the stomach showed no evidence for H pylori.   GES 11/29/2015 - normal   MRI brain 12/07/2015 - normal, incidental 7 mm Rathke's cleft cyst.      Past Medical History:  Diagnosis Date  . Allergy   . Anxiety and depression 12/19/2009  . Arthritis   . Cancer (HCC)    vaginal  . Constipation   . Degenerative disorder of eye   . Diverticulosis   . Essential hypertension, benign 06/26/2009  . GERD 05/27/2007   takes Nexium daily  . Heart murmur    yrs ago  . History of gout   . History of migraine many yrs ago  . Hyperlipidemia    takes Pravastatin daily  .  Hypothyroidism   . Insomnia    but doesn't take any meds  . LOW BACK PAIN 05/27/2007  . Numbness    left toes   . Osteoporosis   . Seizures ([place])    last 1983, none since then- last seizure 1988 per pt      Past Surgical History:  Procedure Laterality Date  . ADENOIDECTOMY    . BACK SURGERY     lumbar x2  . COLONOSCOPY  2009  . ESOPHAGOGASTRODUODENOSCOPY    . Heel spurs Bilateral   . KNEE ARTHROPLASTY Left 11/07/2013   Procedure: COMPUTER ASSISTED TOTAL KNEE ARTHROPLASTY;  Surgeon: Marybelle Killings, MD;  Location: [place];  Service: Orthopedics;  Laterality: Left;  Left Total Knee Arthroplasty, Cemented, Computer Assist  . TONSILLECTOMY    . TOTAL KNEE REVISION Left 09/19/2014   Procedure:  LEFT TOTAL KNEE REVISION;  Surgeon: Leandrew Koyanagi, MD;  Location: [place];  Service: Orthopedics;  Laterality: Left;  . UPPER GASTROINTESTINAL ENDOSCOPY     Family History  Problem Relation Age of Onset  . Aortic dissection Father   . Arthritis Mother   . Stroke Mother 8  . Heart murmur Other   . Colon cancer Neg Hx   . Esophageal cancer Neg Hx   . Stomach cancer Neg Hx   . Rectal cancer Neg Hx   . Colon polyps Neg Hx    Social History   Tobacco Use  . Smoking status: Former Smoker    Packs/day: 0.25    Years: 20.00    Pack years: 5.00    Types: Cigarettes    Quit date: 09/08/1978    Years since quitting: 40.6  . Smokeless tobacco: Never Used  . Tobacco comment: quit smoking 45yrs ago  Substance Use Topics  . Alcohol use: No  . Drug use: No   Current Outpatient Medications  Medication Sig Dispense Refill  . albuterol (VENTOLIN HFA) 108 (90 Base) MCG/ACT inhaler Inhale 2 puffs into the lungs every 4 (four) hours as needed for wheezing or shortness of breath. 18 g 0  . aspirin 81 MG tablet Take 81 mg by mouth daily.    . beta carotene w/minerals (OCUVITE) tablet Take 1 tablet by mouth daily.    . Cholecalciferol (VITAMIN D3 PO) Take by mouth.    . Cyanocobalamin (VITAMIN B 12 PO) Take by mouth daily.      . cyclobenzaprine (FLEXERIL) 10 MG tablet Take 1 tablet (10 mg total) by mouth 2 (two) times daily as needed for muscle spasms. 30 tablet 0  . dicyclomine (BENTYL) 10 MG capsule Take 1 capsule (10 mg total) by mouth every 8 (eight) hours as needed for spasms. 30 capsule 3  . esomeprazole (NEXIUM) 20 MG capsule Take 2 capsules (40 mg total) by mouth daily before breakfast. 2 capsule 0  . fluticasone (FLONASE) 50 MCG/ACT nasal spray Place 2 sprays into both nostrils daily. (Patient taking differently: Place 2 sprays into both nostrils daily as needed. ) 16 g 6  . levocetirizine (XYZAL) 5 MG tablet Take by mouth daily.     Marland Kitchen levothyroxine (SYNTHROID) 88 MCG tablet TAKE 1 TABLET BY  MOUTH EVERY DAY 90 tablet 1  . lisinopril (ZESTRIL) 5 MG tablet TAKE 1 TABLET BY MOUTH EVERY DAY 90 tablet 1  . methocarbamol (ROBAXIN) 500 MG tablet Take 1 tablet (500 mg total) by mouth 4 (four) times daily. (Patient taking differently: Take 500 mg by mouth every 6 (six) hours as needed. )  30 tablet 0  . ondansetron (ZOFRAN ODT) 4 MG disintegrating tablet Take 1 tablet (4 mg total) by mouth every 6 (six) hours as needed for nausea or vomiting. 30 tablet 3  . pravastatin (PRAVACHOL) 20 MG tablet TAKE 1 TABLET BY MOUTH EVERY DAY 90 tablet 1  . sertraline (ZOLOFT) 100 MG tablet TAKE 1 TABLET (100 MG TOTAL) BY MOUTH DAILY. TAKE WITH 25MG =125MG  90 tablet 1  . sertraline (ZOLOFT) 25 MG tablet TAKE 1 TABLET (25 MG TOTAL) BY MOUTH DAILY. TAKE WITH 100MG =125MG . 90 tablet 1  . Spacer/Aero-Holding Dorise Bullion Use as directed with inhaler 1 each 0  . zonisamide (ZONEGRAN) 100 MG capsule Take 200 mg by mouth 2 (two) times daily.     Current Facility-Administered Medications  Medication Dose Route Frequency Provider Last Rate Last Admin  . methylPREDNISolone acetate (DEPO-MEDROL) injection 40 mg  40 mg Intra-articular Once Hilts, [name], MD       Allergies  Allergen Reactions  . Lamotrigine Swelling    Tongue swelling  . Bupropion Other (See Comments)    not known  . Carbamazepine Other (See Comments)    Hypnatremia  . Levetiracetam     Other reaction(s): Dizziness (intolerance)  . Tramadol Other (See Comments)    not known  . Adhesive [Tape]     ?  Blister after knee surgery  . Clarithromycin     Unsure of reaction    . Doxycycline     Interfered with seizure medication  . Nickel     Had to remove knee implant with nickel and replace it  . Other     Metal and perfumes  . Estradiol Rash  . Latex Rash  . Phenytoin Sodium Extended Other (See Comments)    drowsiness     Review of Systems: All systems reviewed and negative except where noted in HPI.   Lab Results  Component Value  Date   WBC 8.5 03/24/2019   HGB 13.8 03/24/2019   HCT 41.8 03/24/2019   MCV 95.2 03/24/2019   PLT 215.0 03/24/2019    Lab Results  Component Value Date   CREATININE 0.87 03/24/2019   BUN 13 03/24/2019   NA 142 03/24/2019   K 4.3 03/24/2019   CL 106 03/24/2019   CO2 29 03/24/2019    Lab Results  Component Value Date   ALT 10 03/24/2019   AST 14 03/24/2019   ALKPHOS 95 03/24/2019   BILITOT 0.4 03/24/2019     Physical Exam: BP 124/64   Pulse 85   Temp (!) 97.4 F (36.3 C)   Ht 5\' 5"  (1.651 m)   Wt 231 lb (104.8 kg)   BMI 38.44 kg/m  Constitutional: Pleasant,well-developed, female in no acute distress. HEENT: Normocephalic and atraumatic. Conjunctivae are normal. No scleral icterus. Neck supple.  Cardiovascular: Normal rate, regular rhythm.  Pulmonary/chest: Effort normal and breath sounds normal. No wheezing, rales or rhonchi. Abdominal: Soft, nondistended, nontender. There are no masses palpable. No hepatomegaly. Extremities: no edema Lymphadenopathy: No cervical adenopathy noted. Neurological: Alert and oriented to person place and time. Skin: Skin is warm and dry. No rashes noted. Psychiatric: Normal mood and affect. Behavior is normal.   ASSESSMENT AND PLAN: 74 year old female here for reassessment of the following issues:  Dysphagia / GERD - ongoing intermittent solid food dysphagia, reflux seems well controlled with PPI.  I discussed options with her, namely repeat EGD with empiric dilation which she has not had in the past, versus barium swallow to further evaluate  motility and assess for subtle stricture.  I discussed risks and benefits of each.  Given location of her symptoms, I think empiric dilation is a reasonable thing to do.  She wanted to proceed with dilation after discussion of options.  Further recommendations pending results  Chronic nausea  / Abdominal discomfort - extensive evaluation for chronic nausea which has persisted over time.  She is not  doing well with Zofran and we will give her a trial of Phenergan to see if that helps.  I am wondering if this could be medication reaction, particularly the zonisamide, which on further review can cause chronic nausea and decreased appetite and about 9 to 10% of patient to take this chronically.  I asked her to touch base with her prescribing provider to see if she can be switched to another regimen and see if this may help some of her symptoms.  She also gets improvement with her abdominal discomfort with Bentyl, recommend she use this more frequently and see if that will help minimize her symptoms.  She has had a pretty good evaluation with endoscopies and CT scans in recent years for these symptoms.  She should review her CT and ultrasound findings of the ovarian cyst with her primary care as was previously recommended to determine any other long-term follow-up for this  I spent 35  minutes of time, including in depth chart review, independent review of results as outlined above, communicating results with the patient directly, face-to-face time with the patient, coordinating care, and ordering studies and medications as appropriate, and documenting this encounter.   [place]ville Cellar, MD [facility]

## 2019-05-03 ENCOUNTER — Encounter: Payer: Self-pay | Admitting: Gastroenterology

## 2019-05-03 ENCOUNTER — Ambulatory Visit (INDEPENDENT_AMBULATORY_CARE_PROVIDER_SITE_OTHER): Payer: Medicare HMO

## 2019-05-03 DIAGNOSIS — Z1159 Encounter for screening for other viral diseases: Secondary | ICD-10-CM

## 2019-05-04 LAB — SARS CORONAVIRUS 2 (TAT 6-24 HRS): SARS Coronavirus 2: NEGATIVE

## 2019-05-05 ENCOUNTER — Other Ambulatory Visit: Payer: Self-pay

## 2019-05-05 ENCOUNTER — Ambulatory Visit (AMBULATORY_SURGERY_CENTER): Payer: Medicare HMO | Admitting: Gastroenterology

## 2019-05-05 ENCOUNTER — Encounter: Payer: Self-pay | Admitting: Gastroenterology

## 2019-05-05 VITALS — BP 118/53 | HR 64 | Temp 98.0°F | Resp 17 | Ht 65.0 in | Wt 231.0 lb

## 2019-05-05 DIAGNOSIS — R1310 Dysphagia, unspecified: Secondary | ICD-10-CM

## 2019-05-05 DIAGNOSIS — R110 Nausea: Secondary | ICD-10-CM

## 2019-05-05 DIAGNOSIS — K3189 Other diseases of stomach and duodenum: Secondary | ICD-10-CM | POA: Diagnosis not present

## 2019-05-05 DIAGNOSIS — K219 Gastro-esophageal reflux disease without esophagitis: Secondary | ICD-10-CM | POA: Diagnosis not present

## 2019-05-05 MED ORDER — SODIUM CHLORIDE 0.9 % IV SOLN: 500.0000 mL | INTRAVENOUS | Status: DC

## 2019-05-05 NOTE — Op Note (Signed)
[place] Patient Name: Beverly Ballard Procedure Date: 05/05/2019 9:29 AM MRN: [id number] Endoscopist: Remo Lipps P. Havery Moros , MD Age: 74 Referring MD:  Date of Birth: 1945/10/29 Gender: Female Account #: [id number] Procedure:                Upper GI endoscopy Indications:              Dysphagia, chronic nausea - unclear etiology - here                            for EGD with empiric dilation, unclear if nausea is                            related to Zonisamide Medicines:                Monitored Anesthesia Care Procedure:                Pre-Anesthesia Assessment:                           - Prior to the procedure, a History and Physical                            was performed, and patient medications and                            allergies were reviewed. The patient's tolerance of                            previous anesthesia was also reviewed. The risks                            and benefits of the procedure and the sedation                            options and risks were discussed with the patient.                            All questions were answered, and informed consent                            was obtained. Prior Anticoagulants: The patient has                            taken no previous anticoagulant or antiplatelet                            agents. ASA Grade Assessment: III - A patient with                            severe systemic disease. After reviewing the risks                            and benefits, the patient was deemed in  satisfactory condition to undergo the procedure.                           After obtaining informed consent, the endoscope was                            passed under direct vision. Throughout the                            procedure, the patient's blood pressure, pulse, and                            oxygen saturations were monitored continuously. The                            Endoscope was  introduced through the mouth, and                            advanced to the second part of duodenum. The upper                            GI endoscopy was accomplished without difficulty.                            The patient tolerated the procedure well. Scope In: Scope Out: Findings:                 Esophagogastric landmarks were identified: the                            Z-line was found at 38 cm, the gastroesophageal                            junction was found at 38 cm and the upper extent of                            the gastric folds was found at 39 cm from the                            incisors.                           A 1 cm hiatal hernia was present.                           The exam of the esophagus was otherwise normal. No                            obvious stenosis / stricture.                           A guidewire was placed and the scope was withdrawn.  Dilation was performed in the entire esophagus with                            a Savary dilator with mild resistance at 17 mm and                            18 mm. Relook endoscopy showed a ? small wrent in                            the upper esophagus below the UES. Biopsies were                            taken with a cold forceps in the upper third of the                            esophagus, in the middle third of the esophagus and                            in the lower third of the esophagus for histology.                           The entire examined stomach was normal. Biopsies                            were taken with a cold forceps for Helicobacter                            pylori testing.                           The duodenal bulb and second portion of the                            duodenum were normal. Complications:            No immediate complications. Estimated blood loss:                            Minimal. Estimated Blood Loss:     Estimated blood loss was  minimal. Impression:               - Esophagogastric landmarks identified.                           - 1 cm hiatal hernia.                           - Normal esophagus - empiric dilation performed to                            24mm, ? small wrent noted in the upper esophagus                            post dilation, biopsies taken to rule  out EoE                           - Normal stomach. Biopsied.                           - Normal duodenal bulb and second portion of the                            duodenum. Recommendation:           - Patient has a contact number available for                            emergencies. The signs and symptoms of potential                            delayed complications were discussed with the                            patient. Return to normal activities tomorrow.                            Written discharge instructions were provided to the                            patient.                           - Resume previous diet.                           - Continue present medications.                           - Await pathology results and course post dilation                           - Consideration for change Zonisamide regimen, may                            be related to chronic nausea, would discuss with                            that prescribing provider Remo Lipps P. Nilza Eaker, MD 05/05/2019 9:56:49 AM This report has been signed electronically.

## 2019-05-05 NOTE — Progress Notes (Signed)
Pt tolerated well. VSS. Awake and to recovery. 

## 2019-05-05 NOTE — Progress Notes (Signed)
Temp LC V/s CW 

## 2019-05-05 NOTE — Patient Instructions (Signed)
Nothing by mouth until 10:50, clear liquids until 11:50, soft foods for the rest of today. Tomorrow you may resume regular diet.    YOU HAD AN ENDOSCOPIC PROCEDURE TODAY AT [facility]:   Refer to the procedure report that was given to you for any specific questions about what was found during the examination.  If the procedure report does not answer your questions, please call your gastroenterologist to clarify.  If you requested that your care partner not be given the details of your procedure findings, then the procedure report has been included in a sealed envelope for you to review at your convenience later.  YOU SHOULD EXPECT: Some feelings of bloating in the abdomen. Passage of more gas than usual.  Walking can help get rid of the air that was put into your GI tract during the procedure and reduce the bloating. If you had a lower endoscopy (such as a colonoscopy or flexible sigmoidoscopy) you may notice spotting of blood in your stool or on the toilet paper. If you underwent a bowel prep for your procedure, you may not have a normal bowel movement for a few days.  Please Note:  You might notice some irritation and congestion in your nose or some drainage.  This is from the oxygen used during your procedure.  There is no need for concern and it should clear up in a day or so.  SYMPTOMS TO REPORT IMMEDIATELY:    Following upper endoscopy (EGD)  Vomiting of blood or coffee ground material  New chest pain or pain under the shoulder blades  Painful or persistently difficult swallowing  New shortness of breath  Fever of 100F or higher  Black, tarry-looking stools  For urgent or emergent issues, a gastroenterologist can be reached at any hour by calling [phone].   DIET:  Nothing by mouth until 10:50, clear liquids until 11:50, soft foods for the rest of today. Tommorrow you may proceed to your regular diet.  Drink plenty of fluids but you should avoid alcoholic  beverages for 24 hours.  ACTIVITY:  You should plan to take it easy for the rest of today and you should NOT DRIVE or use heavy machinery until tomorrow (because of the sedation medicines used during the test).    FOLLOW UP: Our staff will call the number listed on your records 48-72 hours following your procedure to check on you and address any questions or concerns that you may have regarding the information given to you following your procedure. If we do not reach you, we will leave a message.  We will attempt to reach you two times.  During this call, we will ask if you have developed any symptoms of COVID 19. If you develop any symptoms (ie: fever, flu-like symptoms, shortness of breath, cough etc.) before then, please call [phone].  If you test positive for Covid 19 in the 2 weeks post procedure, please call and report this information to [place].    If any biopsies were taken you will be contacted by phone or by letter within the next 1-3 weeks.  Please call us at [phone] if you have not heard about the biopsies in 3 weeks.    SIGNATURES/CONFIDENTIALITY: You and/or your care partner have signed paperwork which will be entered into your electronic medical record.  These signatures attest to the fact that that the information above on your After Visit Summary has been reviewed and is understood.  Full responsibility of the confidentiality  of this discharge information lies with you and/or your care-partner.

## 2019-05-05 NOTE — Progress Notes (Signed)
Called to room to assist during endoscopic procedure.  Patient ID and intended procedure confirmed with present staff. Received instructions for my participation in the procedure from the performing physician.  

## 2019-05-08 ENCOUNTER — Other Ambulatory Visit: Payer: Self-pay | Admitting: Family Medicine

## 2019-05-08 ENCOUNTER — Other Ambulatory Visit: Payer: Self-pay | Admitting: Physician Assistant

## 2019-05-09 ENCOUNTER — Telehealth: Payer: Self-pay | Admitting: *Deleted

## 2019-05-09 ENCOUNTER — Telehealth: Payer: Self-pay

## 2019-05-09 NOTE — Telephone Encounter (Signed)
1. Have you developed a fever since your procedure? no  2.   Have you had an respiratory symptoms (SOB or cough) since your procedure? no  3.   Have you tested positive for COVID 19 since your procedure no  4.   Have you had any family members/close contacts diagnosed with the COVID 19 since your procedure?  no   If yes to any of these questions please route to Joylene John, RN and Alphonsa Gin, Therapist, sports.  Follow up Call-  Call back number 05/05/2019 03/05/2017  Post procedure Call Back phone  # [phone]  Permission to leave phone message Yes Yes  Some recent data might be hidden     Patient questions:  Do you have a fever, pain , or abdominal swelling? No. Pain Score  0 *  Have you tolerated food without any problems? Yes.    Have you been able to return to your normal activities? Yes.    Do you have any questions about your discharge instructions: Diet   No. Medications  No. Follow up visit  No.  Do you have questions or concerns about your Care? No.  Actions: * If pain score is 4 or above: No action needed, pain <4.

## 2019-05-09 NOTE — Telephone Encounter (Signed)
Left message on follow up call. 

## 2019-05-26 ENCOUNTER — Other Ambulatory Visit: Payer: Self-pay | Admitting: Physician Assistant

## 2019-05-31 DIAGNOSIS — Z01419 Encounter for gynecological examination (general) (routine) without abnormal findings: Secondary | ICD-10-CM | POA: Diagnosis not present

## 2019-05-31 DIAGNOSIS — Z1231 Encounter for screening mammogram for malignant neoplasm of breast: Secondary | ICD-10-CM | POA: Diagnosis not present

## 2019-05-31 DIAGNOSIS — Z0000 Encounter for general adult medical examination without abnormal findings: Secondary | ICD-10-CM | POA: Diagnosis not present

## 2019-06-02 ENCOUNTER — Encounter: Payer: Self-pay | Admitting: Orthopaedic Surgery

## 2019-06-03 ENCOUNTER — Other Ambulatory Visit: Payer: Self-pay

## 2019-06-03 MED ORDER — CYCLOBENZAPRINE HCL 10 MG PO TABS: 10.0000 mg | ORAL_TABLET | Freq: Two times a day (BID) | ORAL | 0 refills | Status: DC | PRN

## 2019-06-03 NOTE — Telephone Encounter (Signed)
Ok to send in.  

## 2019-06-05 ENCOUNTER — Encounter: Payer: Self-pay | Admitting: Family Medicine

## 2019-06-06 ENCOUNTER — Telehealth: Payer: Self-pay | Admitting: *Deleted

## 2019-06-06 ENCOUNTER — Telehealth (INDEPENDENT_AMBULATORY_CARE_PROVIDER_SITE_OTHER): Payer: Medicare HMO | Admitting: Family Medicine

## 2019-06-06 DIAGNOSIS — R5383 Other fatigue: Secondary | ICD-10-CM | POA: Diagnosis not present

## 2019-06-06 DIAGNOSIS — Z8701 Personal history of pneumonia (recurrent): Secondary | ICD-10-CM

## 2019-06-06 DIAGNOSIS — Z7189 Other specified counseling: Secondary | ICD-10-CM

## 2019-06-06 NOTE — Progress Notes (Signed)
Virtual Visit via Video Note  I connected with Beverly Ballard on 06/06/19 at 10:30 AM EST by a video enabled telemedicine application 2/2 [id number] pandemic and verified that I am speaking with the correct person using two identifiers.  Location patient: home Location provider:work or home office Persons participating in the virtual visit: patient, provider  I discussed the limitations of evaluation and management by telemedicine and the availability of in person appointments. The patient expressed understanding and agreed to proceed.   HPI: Pt is a 74 yo female with pmh sig for HTN, GERD, hypothyroidism, seizure d/o, osteopenia, HLD, h/o anxiety and depression.  Pt dx'd with pneumonia in early Dec 2020.  Endorses continued SOB with exertion, chest tightness, and fatigue.  Notes improvement in cough.  Pt inquires if she should get a COVID vaccine.  She was advised to ask her provider when she went with her husband who received his.  Pt denies fever, chills, chest pain, LE edema.  Pt notes recent esophageal dilation.  ROS: See pertinent positives and negatives per HPI.  Past Medical History:  Diagnosis Date  . Allergy   . Anxiety and depression 12/19/2009  . Arthritis   . Cancer (HCC)    vaginal  . Constipation   . Degenerative disorder of eye   . Diverticulosis   . Essential hypertension, benign 06/26/2009  . GERD 05/27/2007   takes Nexium daily  . Heart murmur    yrs ago  . History of gout   . History of migraine many yrs ago  . Hyperlipidemia    takes Pravastatin daily  . Hypothyroidism   . Insomnia    but doesn't take any meds  . LOW BACK PAIN 05/27/2007  . Numbness    left toes   . Osteoporosis   . Seizures ([place])    last 1983, none since then- last seizure 1988 per pt     Past Surgical History:  Procedure Laterality Date  . ADENOIDECTOMY    . BACK SURGERY     lumbar x2  . COLONOSCOPY  2009  . ESOPHAGOGASTRODUODENOSCOPY    . Heel spurs Bilateral   . KNEE ARTHROPLASTY  Left 11/07/2013   Procedure: COMPUTER ASSISTED TOTAL KNEE ARTHROPLASTY;  Surgeon: Marybelle Killings, MD;  Location: [place];  Service: Orthopedics;  Laterality: Left;  Left Total Knee Arthroplasty, Cemented, Computer Assist  . TONSILLECTOMY    . TOTAL KNEE REVISION Left 09/19/2014   Procedure: LEFT TOTAL KNEE REVISION;  Surgeon: Leandrew Koyanagi, MD;  Location: [facility];  Service: Orthopedics;  Laterality: Left;  . UPPER GASTROINTESTINAL ENDOSCOPY      Family History  Problem Relation Age of Onset  . Aortic dissection Father   . Arthritis Mother   . Stroke Mother 20  . Heart murmur Other   . Colon cancer Neg Hx   . Esophageal cancer Neg Hx   . Stomach cancer Neg Hx   . Rectal cancer Neg Hx   . Colon polyps Neg Hx      Current Outpatient Medications:  .  albuterol (VENTOLIN HFA) 108 (90 Base) MCG/ACT inhaler, Inhale 2 puffs into the lungs every 4 (four) hours as needed for wheezing or shortness of breath., Disp: 18 g, Rfl: 0 .  aspirin 81 MG tablet, Take 81 mg by mouth daily., Disp: , Rfl:  .  beta carotene w/minerals (OCUVITE) tablet, Take 1 tablet by mouth daily., Disp: , Rfl:  .  Cholecalciferol (VITAMIN D3 PO), Take by mouth., Disp: , Rfl:  .  Cyanocobalamin (VITAMIN B 12 PO), Take by mouth daily.  , Disp: , Rfl:  .  cyclobenzaprine (FLEXERIL) 10 MG tablet, Take 1 tablet (10 mg total) by mouth 2 (two) times daily as needed for muscle spasms., Disp: 30 tablet, Rfl: 0 .  dicyclomine (BENTYL) 10 MG capsule, Take 1 capsule (10 mg total) by mouth every 8 (eight) hours as needed for spasms. (Patient not taking: Reported on 05/05/2019), Disp: 30 capsule, Rfl: 3 .  esomeprazole (NEXIUM) 20 MG capsule, Take 2 capsules (40 mg total) by mouth daily before breakfast., Disp: 2 capsule, Rfl: 0 .  fluticasone (FLONASE) 50 MCG/ACT nasal spray, Place 2 sprays into both nostrils daily. (Patient not taking: Reported on 05/05/2019), Disp: 16 g, Rfl: 6 .  levocetirizine (XYZAL) 5 MG tablet, Take by mouth daily. , Disp:  , Rfl:  .  levothyroxine (SYNTHROID) 88 MCG tablet, TAKE 1 TABLET BY MOUTH EVERY DAY, Disp: 90 tablet, Rfl: 1 .  lisinopril (ZESTRIL) 5 MG tablet, TAKE 1 TABLET BY MOUTH EVERY DAY, Disp: 90 tablet, Rfl: 1 .  methocarbamol (ROBAXIN) 500 MG tablet, Take 1 tablet (500 mg total) by mouth 4 (four) times daily. (Patient not taking: Reported on 05/05/2019), Disp: 30 tablet, Rfl: 0 .  ondansetron (ZOFRAN ODT) 4 MG disintegrating tablet, Take 1 tablet (4 mg total) by mouth every 6 (six) hours as needed for nausea or vomiting. (Patient not taking: Reported on 05/05/2019), Disp: 30 tablet, Rfl: 3 .  pravastatin (PRAVACHOL) 20 MG tablet, TAKE 1 TABLET BY MOUTH EVERY DAY, Disp: 90 tablet, Rfl: 1 .  promethazine (PHENERGAN) 12.5 MG tablet, Take 1 tablet (12.5 mg total) by mouth every 8 (eight) hours as needed for nausea or vomiting. (Patient not taking: Reported on 05/05/2019), Disp: 30 tablet, Rfl: 1 .  sertraline (ZOLOFT) 100 MG tablet, TAKE 1 TABLET (100 MG TOTAL) BY MOUTH DAILY. TAKE WITH 25MG =125MG , Disp: 90 tablet, Rfl: 1 .  sertraline (ZOLOFT) 25 MG tablet, TAKE 1 TABLET (25 MG TOTAL) BY MOUTH DAILY. TAKE WITH 100MG =125MG ., Disp: 90 tablet, Rfl: 1 .  Spacer/Aero-Holding Chambers DEVI, Use as directed with inhaler, Disp: 1 each, Rfl: 0 .  zonisamide (ZONEGRAN) 100 MG capsule, Take 200 mg by mouth 2 (two) times daily., Disp: , Rfl:   Current Facility-Administered Medications:  .  methylPREDNISolone acetate (DEPO-MEDROL) injection 40 mg, 40 mg, Intra-articular, Once, Hilts, Michael, MD  EXAMTonette Bihari per patient if applicable:  RR between 12-20 bpm  GENERAL: alert, oriented, appears well and in no acute distress  HEENT: atraumatic, conjunctiva clear, no obvious abnormalities on inspection of external nose and ears  NECK: normal movements of the head and neck  LUNGS: on inspection no signs of respiratory distress, breathing rate appears normal, no obvious gross SOB, gasping or wheezing  CV: no obvious  cyanosis  MS: moves all visible extremities without noticeable abnormality  PSYCH/NEURO: pleasant and cooperative, no obvious depression or anxiety, speech and thought processing grossly intact  ASSESSMENT AND PLAN:  Discussed the following assessment and plan:  History of pneumonia -pt with continued SOB and fatigue s/p dx of pna in Dec 2020 -CXR 03/30/19 with subtly increased opacity within RUL, suspicious for developing pneumonia.  Underlying chronic interstitial lung dz, as seen on prior studies. -advised on typical dz course. -discussed deconditioning, increase activity as tolerated. -f/u in 1-2 wks in person.  Consider repeat CXR and PT.  Fatigue, unspecified type  Educated about COVID -discussed s/s -pt advised to wait until resolution of current symptoms  before getting COVID vaccine.    I discussed the assessment and treatment plan with the patient. The patient was provided an opportunity to ask questions and all were answered. The patient agreed with the plan and demonstrated an understanding of the instructions.   The patient was advised to call back or seek an in-person evaluation if the symptoms worsen or if the condition fails to improve as anticipated.  Billie Ruddy, MD

## 2019-06-06 NOTE — Telephone Encounter (Signed)
Patient called back and stated she still has shortness of breath and feels so tired.  Stated she had a negative COVID test recently due to surgery about 2-3 weeks ago.  I advised the pt she can await review of the message by Dr Ethlyn Gallery but it may be best to be evaluated via virtual visit or urgent care.  Patient preferred an appt and this was scheduled with Dr Volanda Napoleon today at 10:30am.

## 2019-06-19 DIAGNOSIS — H269 Unspecified cataract: Secondary | ICD-10-CM | POA: Diagnosis not present

## 2019-06-19 DIAGNOSIS — K219 Gastro-esophageal reflux disease without esophagitis: Secondary | ICD-10-CM | POA: Diagnosis not present

## 2019-06-19 DIAGNOSIS — F4321 Adjustment disorder with depressed mood: Secondary | ICD-10-CM | POA: Diagnosis not present

## 2019-06-19 DIAGNOSIS — J309 Allergic rhinitis, unspecified: Secondary | ICD-10-CM | POA: Diagnosis not present

## 2019-06-19 DIAGNOSIS — E039 Hypothyroidism, unspecified: Secondary | ICD-10-CM | POA: Diagnosis not present

## 2019-06-19 DIAGNOSIS — I10 Essential (primary) hypertension: Secondary | ICD-10-CM | POA: Diagnosis not present

## 2019-06-19 DIAGNOSIS — E669 Obesity, unspecified: Secondary | ICD-10-CM | POA: Diagnosis not present

## 2019-06-19 DIAGNOSIS — Z008 Encounter for other general examination: Secondary | ICD-10-CM | POA: Diagnosis not present

## 2019-06-19 DIAGNOSIS — E785 Hyperlipidemia, unspecified: Secondary | ICD-10-CM | POA: Diagnosis not present

## 2019-06-19 DIAGNOSIS — R69 Illness, unspecified: Secondary | ICD-10-CM | POA: Diagnosis not present

## 2019-06-19 DIAGNOSIS — I2510 Atherosclerotic heart disease of native coronary artery without angina pectoris: Secondary | ICD-10-CM | POA: Diagnosis not present

## 2019-06-21 ENCOUNTER — Other Ambulatory Visit: Payer: Self-pay

## 2019-06-21 NOTE — Progress Notes (Signed)
Beverly Ballard DOB: [birth year] Encounter date: 06/22/2019  This is a 74 y.o. female who presents with Chief Complaint  Patient presents with  . Follow-up    History of present illness: Follow up s/p pneumonia dx in December 2020 (03/30/19 xry with increased opacity RUL). Treated with levaquin 750mg  daily x 7 days.  Still very tired, still getting out of breathe. Just feels like she can't do anything. Got up last week and made a mess (redecorating) and just can't get up and do what she used to do. After doing this just useless; had to stay in bed for a whole day; then couldn't do anything the next day. Just not like her.   Started coughing a little more, but not much. Gets a lot of drainage in her throat. No blood. Does feel a little wheezy. Does use albuterol and it helps some. Just using now if getting really out of breath.   Minimal smoking history. Has had inhalers in past, but not using them.   No swelling in legs. Just no energy. Sitting in chair pretty much all day.   Appetite has changed, not always caring whether she eats or not. Trying to drink ensure daily.   HPI   Allergies  Allergen Reactions  . Lamotrigine Swelling    Tongue swelling  . Bupropion Other (See Comments)    not known  . Carbamazepine Other (See Comments)    Hypnatremia  . Levetiracetam     Other reaction(s): Dizziness (intolerance)  . Tramadol Other (See Comments)    not known  . Adhesive [Tape]     ?  Blister after knee surgery  . Clarithromycin     Unsure of reaction    . Doxycycline     Interfered with seizure medication  . Nickel     Had to remove knee implant with nickel and replace it  . Other     Metal and perfumes  . Estradiol Rash  . Latex Rash  . Phenytoin Sodium Extended Other (See Comments)    drowsiness   Current Meds  Medication Sig  . albuterol (VENTOLIN HFA) 108 (90 Base) MCG/ACT inhaler Inhale 2 puffs into the lungs every 4 (four) hours as needed for wheezing or  shortness of breath.  Marland Kitchen aspirin 81 MG tablet Take 81 mg by mouth daily.  . beta carotene w/minerals (OCUVITE) tablet Take 1 tablet by mouth daily.  . Cholecalciferol (VITAMIN D3 PO) Take by mouth.  . Cyanocobalamin (VITAMIN B 12 PO) Take by mouth daily.    . cyclobenzaprine (FLEXERIL) 10 MG tablet Take 1 tablet (10 mg total) by mouth 2 (two) times daily as needed for muscle spasms.  Marland Kitchen dicyclomine (BENTYL) 10 MG capsule Take 1 capsule (10 mg total) by mouth every 8 (eight) hours as needed for spasms.  Marland Kitchen esomeprazole (NEXIUM) 20 MG capsule Take 2 capsules (40 mg total) by mouth daily before breakfast.  . fluticasone (FLONASE) 50 MCG/ACT nasal spray Place 2 sprays into both nostrils daily.  Marland Kitchen levocetirizine (XYZAL) 5 MG tablet Take by mouth daily.   Marland Kitchen levothyroxine (SYNTHROID) 88 MCG tablet TAKE 1 TABLET BY MOUTH EVERY DAY  . lisinopril (ZESTRIL) 5 MG tablet TAKE 1 TABLET BY MOUTH EVERY DAY  . methocarbamol (ROBAXIN) 500 MG tablet Take 1 tablet (500 mg total) by mouth 4 (four) times daily.  . ondansetron (ZOFRAN ODT) 4 MG disintegrating tablet Take 1 tablet (4 mg total) by mouth every 6 (six) hours as needed for nausea or  vomiting.  . pravastatin (PRAVACHOL) 20 MG tablet TAKE 1 TABLET BY MOUTH EVERY DAY  . promethazine (PHENERGAN) 12.5 MG tablet Take 1 tablet (12.5 mg total) by mouth every 8 (eight) hours as needed for nausea or vomiting.  . sertraline (ZOLOFT) 100 MG tablet TAKE 1 TABLET (100 MG TOTAL) BY MOUTH DAILY. TAKE WITH 25MG =125MG   . sertraline (ZOLOFT) 25 MG tablet TAKE 1 TABLET (25 MG TOTAL) BY MOUTH DAILY. TAKE WITH 100MG =125MG .  . Spacer/Aero-Holding Dorise Bullion Use as directed with inhaler  . zonisamide (ZONEGRAN) 100 MG capsule Take 200 mg by mouth 2 (two) times daily.   Current Facility-Administered Medications for the 06/22/19 encounter (Office Visit) with Caren Macadam, MD  Medication  . methylPREDNISolone acetate (DEPO-MEDROL) injection 40 mg    Review of Systems   Constitutional: Positive for fatigue. Negative for chills and fever.  HENT: Positive for congestion. Negative for ear pain and sore throat.   Respiratory: Positive for cough, shortness of breath and wheezing. Negative for chest tightness.   Cardiovascular: Negative for chest pain (right upper back), palpitations and leg swelling.  Gastrointestinal: Negative for abdominal pain, diarrhea, nausea and vomiting.  Genitourinary: Negative for dysuria and flank pain.    Objective:  BP (!) 132/58 (BP Location: Left Arm, Patient Position: Sitting, Cuff Size: Large)   Pulse 77   Temp 97.7 F (36.5 C) (Temporal)   Ht 5\' 5"  (1.651 m)   Wt 233 lb 9.6 oz (106 kg)   SpO2 97%   BMI 38.87 kg/m   Weight: 233 lb 9.6 oz (106 kg)   BP Readings from Last 3 Encounters:  06/22/19 (!) 132/58  05/05/19 (!) 118/53  05/02/19 124/64   Wt Readings from Last 3 Encounters:  06/22/19 233 lb 9.6 oz (106 kg)  05/05/19 231 lb (104.8 kg)  05/02/19 231 lb (104.8 kg)    Physical Exam Constitutional:      General: She is not in acute distress.    Appearance: She is well-developed. She is ill-appearing. She is not toxic-appearing or diaphoretic.  Cardiovascular:     Rate and Rhythm: Normal rate and regular rhythm.     Heart sounds: Normal heart sounds. No murmur. No friction rub.  Pulmonary:     Effort: Pulmonary effort is normal. No respiratory distress.     Breath sounds: Examination of the right-upper field reveals rales. Examination of the right-lower field reveals decreased breath sounds. Examination of the left-lower field reveals decreased breath sounds. Decreased breath sounds and rales present. No wheezing.     Comments: Clear RUQ rales on exam Musculoskeletal:     Right lower leg: No edema.     Left lower leg: No edema.  Neurological:     Mental Status: She is alert and oriented to person, place, and time.  Psychiatric:        Behavior: Behavior normal.     Assessment/Plan  1. Fatigue,  unspecified type Suspect infection that has not cleared. Previously pneumonia treated with levaquin. Will cover with augmentin and zithromax. Clarithromycin allergy is listed; uncertain reaction. - DG Chest 2 View; Future  2. Cough Secondary to infection. Follow to resolution.   3. Pneumonia of right upper lobe due to infectious organism See above. Xray today for comparison. - amoxicillin-clavulanate (AUGMENTIN) 875-125 MG tablet; Take 1 tablet by mouth 2 (two) times daily for 7 days.  Dispense: 14 tablet; Refill: 0 - azithromycin (ZITHROMAX) 250 MG tablet; 2 tabs day one, then 1 tab daily x 4 days  Dispense: 6 tablet; Refill: 0    Return in about 2 weeks (around 07/06/2019) for follow up  in office.    Micheline Rough, MD

## 2019-06-22 ENCOUNTER — Encounter: Payer: Self-pay | Admitting: Family Medicine

## 2019-06-22 ENCOUNTER — Ambulatory Visit (INDEPENDENT_AMBULATORY_CARE_PROVIDER_SITE_OTHER): Payer: Medicare HMO

## 2019-06-22 ENCOUNTER — Ambulatory Visit (INDEPENDENT_AMBULATORY_CARE_PROVIDER_SITE_OTHER): Payer: Medicare HMO | Admitting: Family Medicine

## 2019-06-22 VITALS — BP 132/58 | HR 77 | Temp 97.7°F | Ht 65.0 in | Wt 233.6 lb

## 2019-06-22 DIAGNOSIS — R05 Cough: Secondary | ICD-10-CM

## 2019-06-22 DIAGNOSIS — J479 Bronchiectasis, uncomplicated: Secondary | ICD-10-CM | POA: Diagnosis not present

## 2019-06-22 DIAGNOSIS — R5383 Other fatigue: Secondary | ICD-10-CM

## 2019-06-22 DIAGNOSIS — R059 Cough, unspecified: Secondary | ICD-10-CM

## 2019-06-22 DIAGNOSIS — J189 Pneumonia, unspecified organism: Secondary | ICD-10-CM

## 2019-06-22 MED ORDER — AZITHROMYCIN 250 MG PO TABS: ORAL_TABLET | ORAL | 0 refills | Status: DC

## 2019-06-22 MED ORDER — DOXYCYCLINE HYCLATE 100 MG PO TABS: 100.0000 mg | ORAL_TABLET | Freq: Two times a day (BID) | ORAL | 0 refills | Status: DC

## 2019-06-22 MED ORDER — AMOXICILLIN-POT CLAVULANATE 875-125 MG PO TABS: 1.0000 | ORAL_TABLET | Freq: Two times a day (BID) | ORAL | 0 refills | Status: DC

## 2019-06-22 NOTE — Addendum Note (Signed)
Addended by: Suzette Battiest on: 06/22/2019 02:49 PM   Modules accepted: Orders

## 2019-06-24 ENCOUNTER — Other Ambulatory Visit (INDEPENDENT_AMBULATORY_CARE_PROVIDER_SITE_OTHER): Payer: Medicare HMO

## 2019-06-24 ENCOUNTER — Other Ambulatory Visit: Payer: Self-pay

## 2019-06-24 ENCOUNTER — Other Ambulatory Visit: Payer: Self-pay | Admitting: Family Medicine

## 2019-06-24 ENCOUNTER — Telehealth: Payer: Self-pay | Admitting: Family Medicine

## 2019-06-24 DIAGNOSIS — R7301 Impaired fasting glucose: Secondary | ICD-10-CM

## 2019-06-24 DIAGNOSIS — M79605 Pain in left leg: Secondary | ICD-10-CM

## 2019-06-24 DIAGNOSIS — R5383 Other fatigue: Secondary | ICD-10-CM | POA: Diagnosis not present

## 2019-06-24 DIAGNOSIS — E559 Vitamin D deficiency, unspecified: Secondary | ICD-10-CM | POA: Diagnosis not present

## 2019-06-24 DIAGNOSIS — M79604 Pain in right leg: Secondary | ICD-10-CM

## 2019-06-24 DIAGNOSIS — G40909 Epilepsy, unspecified, not intractable, without status epilepticus: Secondary | ICD-10-CM

## 2019-06-24 DIAGNOSIS — R700 Elevated erythrocyte sedimentation rate: Secondary | ICD-10-CM

## 2019-06-24 LAB — CBC WITH DIFFERENTIAL/PLATELET
Basophils Absolute: 0 10*3/uL (ref 0.0–0.1)
Basophils Relative: 0.3 % (ref 0.0–3.0)
Eosinophils Absolute: 0 10*3/uL (ref 0.0–0.7)
Eosinophils Relative: 0 % (ref 0.0–5.0)
HCT: 40.3 % (ref 36.0–46.0)
Hemoglobin: 13.6 g/dL (ref 12.0–15.0)
Lymphocytes Relative: 12 % (ref 12.0–46.0)
Lymphs Abs: 1 10*3/uL (ref 0.7–4.0)
MCHC: 33.8 g/dL (ref 30.0–36.0)
MCV: 94.2 fl (ref 78.0–100.0)
Monocytes Absolute: 0.5 10*3/uL (ref 0.1–1.0)
Monocytes Relative: 5.7 % (ref 3.0–12.0)
Neutro Abs: 6.9 10*3/uL (ref 1.4–7.7)
Neutrophils Relative %: 82 % — ABNORMAL HIGH (ref 43.0–77.0)
Platelets: 188 10*3/uL (ref 150.0–400.0)
RBC: 4.28 Mil/uL (ref 3.87–5.11)
RDW: 13 % (ref 11.5–15.5)
WBC: 8.4 10*3/uL (ref 4.0–10.5)

## 2019-06-24 LAB — COMPREHENSIVE METABOLIC PANEL
ALT: 10 U/L (ref 0–35)
AST: 14 U/L (ref 0–37)
Albumin: 4 g/dL (ref 3.5–5.2)
Alkaline Phosphatase: 105 U/L (ref 39–117)
BUN: 13 mg/dL (ref 6–23)
CO2: 28 mEq/L (ref 19–32)
Calcium: 9.4 mg/dL (ref 8.4–10.5)
Chloride: 104 mEq/L (ref 96–112)
Creatinine, Ser: 0.86 mg/dL (ref 0.40–1.20)
GFR: 64.98 mL/min (ref >60.00)
Glucose, Bld: 105 mg/dL — ABNORMAL HIGH (ref 70–99)
Potassium: 4.4 mEq/L (ref 3.5–5.1)
Sodium: 138 mEq/L (ref 135–145)
Total Bilirubin: 0.3 mg/dL (ref 0.2–1.2)
Total Protein: 7.3 g/dL (ref 6.0–8.3)

## 2019-06-24 LAB — VITAMIN D 25 HYDROXY (VIT D DEFICIENCY, FRACTURES): VITD: 41.73 ng/mL (ref 30.00–100.00)

## 2019-06-24 LAB — HEMOGLOBIN A1C: Hgb A1c MFr Bld: 5.7 % (ref 4.6–6.5)

## 2019-06-24 LAB — CK: Total CK: 28 U/L (ref 7–177)

## 2019-06-24 LAB — IBC + FERRITIN
Ferritin: 31.1 ng/mL (ref 10.0–291.0)
Iron: 39 ug/dL — ABNORMAL LOW (ref 42–145)
Saturation Ratios: 10.9 % — ABNORMAL LOW (ref 20.0–50.0)
Transferrin: 255 mg/dL (ref 212.0–360.0)

## 2019-06-24 LAB — SEDIMENTATION RATE: Sed Rate: 34 mm/hr — ABNORMAL HIGH (ref 0–30)

## 2019-06-24 LAB — TSH: TSH: 3.34 u[IU]/mL (ref 0.35–4.50)

## 2019-06-24 LAB — VITAMIN B12: Vitamin B-12: 1287 pg/mL — ABNORMAL HIGH (ref 211–911)

## 2019-06-24 NOTE — Telephone Encounter (Signed)
Pt has been having leg pain and she believes she is having a reaction to medication to the antibiotics. Pt is requesting to speak to you directly. Thanks

## 2019-06-24 NOTE — Telephone Encounter (Signed)
Ok; hold the antibiotic. I would like for her to schedule follow up with pulmonology when able. I would like for her to get bloodwork (today if possible) so we can make sure nothing else going on making her feel so tired, unwell. I will check back in once I get results.

## 2019-06-24 NOTE — Telephone Encounter (Signed)
Spoke with the pt and informed her of the results.  Patient stated she has a heavy sensation in her legs since last night.  Stated she feels irritable, has to walk a lot and the heavy sensation goes down from her back to her legs.  Stated she cannot get comfortable, denies any increased shortness of breath, denies any calf pain nor pain in a specific area.  Stated she started the antibiotic on Wednesday in which she took 2 and she took 4 yesterday.  Message sent to PCP.

## 2019-06-24 NOTE — Telephone Encounter (Signed)
Patient informed of the message below.  Lab appt scheduled for today to arrive at 1:10pm.

## 2019-06-27 LAB — ANTI-NUCLEAR AB-TITER (ANA TITER): ANA Titer 1: 1:40 {titer} — ABNORMAL HIGH

## 2019-06-27 LAB — ANA: Anti Nuclear Antibody (ANA): POSITIVE — AB

## 2019-06-28 ENCOUNTER — Ambulatory Visit: Payer: Medicare HMO | Admitting: Pulmonary Disease

## 2019-06-28 ENCOUNTER — Ambulatory Visit: Payer: Medicare HMO | Admitting: Adult Health

## 2019-06-28 ENCOUNTER — Other Ambulatory Visit: Payer: Self-pay

## 2019-06-28 ENCOUNTER — Ambulatory Visit: Payer: Medicare HMO | Admitting: Primary Care

## 2019-06-28 ENCOUNTER — Encounter: Payer: Self-pay | Admitting: Pulmonary Disease

## 2019-06-28 VITALS — BP 120/70 | HR 83 | Temp 98.4°F | Ht 65.0 in | Wt 236.4 lb

## 2019-06-28 DIAGNOSIS — G4719 Other hypersomnia: Secondary | ICD-10-CM | POA: Insufficient documentation

## 2019-06-28 DIAGNOSIS — R7689 Other specified abnormal immunological findings in serum: Secondary | ICD-10-CM | POA: Insufficient documentation

## 2019-06-28 DIAGNOSIS — Z0000 Encounter for general adult medical examination without abnormal findings: Secondary | ICD-10-CM | POA: Diagnosis not present

## 2019-06-28 DIAGNOSIS — R0602 Shortness of breath: Secondary | ICD-10-CM | POA: Diagnosis not present

## 2019-06-28 DIAGNOSIS — R768 Other specified abnormal immunological findings in serum: Secondary | ICD-10-CM | POA: Diagnosis not present

## 2019-06-28 DIAGNOSIS — J479 Bronchiectasis, uncomplicated: Secondary | ICD-10-CM | POA: Diagnosis not present

## 2019-06-28 NOTE — Assessment & Plan Note (Addendum)
I still believe the patient's shortness of breath is likely multifactorial given her suspected sleep apnea, sedentary lifestyle, and deconditioning.  Pulmonary function tests were fairly stable at last office visit.  Given her continued symptoms as well as known radiology findings of bronchiectasis we will order CT imaging.  Plan: High-resolution CT chest ordered May need to consider walk in office at next follow-up visit Encouraged patient to increase her physical activity

## 2019-06-28 NOTE — Assessment & Plan Note (Signed)
Seen on 2012 CTA chest Stable findings on most recent chest x-ray in March/2021 Patient reports an occasional dry morning cough History of recurrent pneumonias, sinus infections and significant allergies Patient reports continued postnasal drip  Plan: High-resolution CT chest ordered Patient likely would benefit from a flutter valve after we obtain CT imaging to further evaluate

## 2019-06-28 NOTE — Progress Notes (Signed)
@Patient  ID: Beverly Ballard, female    DOB: December 09, 1945, 74 y.o.   MRN: [id number]  Chief Complaint  Patient presents with  . Follow-up    Increased SOB and fatigue for the past 6 months. Went to [place] 2 months ago and was diagnosed with PNA when she returned.     Referring provider: Caren Macadam, MD  HPI:  74 year old female former smoker initially referred to our office in August/2019 for dyspnea.  Bronchiectasis on recent chest x-ray, CT in 2012.  PMH: Hypothyroidism, hypertension, GERD, obesity, fatigue, depression Smoker/ Smoking History: Former smoker.  Quit 1980.  5-pack-year smoking history Maintenance: None Pt of: Dr. Ander Slade  06/28/2019  - Visit   Last seen in October/2019.  Home sleep study was ordered at that time - not completed.  Should her Breo Ellipta inhaler was stopped.  She is encouraged to have regular exercise.  It was felt the shortness of breath was multifactorial related to physical deconditioning.  She was requested to follow-up in 6 to 8 weeks this did not occur.  Patient reporting that since last being seen in 2019 she has had a return to [place] to take care of her mother.  At that time she had another bout of pneumonia.  She continues to have ongoing shortness of breath and fatigue that has worsened over the last 6 months.  She was recently evaluated by primary care where she had a chest x-ray performed as well as baseline lab work.  She is concerned regarding her recent positive ANA lab test.  She continues to have increased daytime fatigue, poor sleep quality as well as continued shortness of breath.   Tests:   06/24/2019-ANA-positive 06/24/19 - ANA titer: 1: 40, pattern-cytoplasmic 06/24/19 - CK-28, normal 06/24/19 - Sed rate-34  05/03/2019-SARS-CoV-2-negative  06/23/2019-chest x-ray-stable changes of bronchiectasis and scarring noted in the right upper lobe and right middle lobe, no evidence of acute infiltrate on exam today  02/10/2018-pulmonary  function test-FVC 2.00 (66% predicted), postbronchodilator ratio 91, postbronchodilator FEV1 1.92 (84% predicted), no bronchodilator response, DLCO 17.04 (70% predicted)  FENO:  No results found for: NITRICOXIDE  PFT: PFT Results Latest Ref Rng & Units 02/10/2018  FVC-Pre L 2.00  FVC-Predicted Pre % 66  FVC-Post L 2.11  FVC-Predicted Post % 70  Pre FEV1/FVC % % 89  Post FEV1/FCV % % 91  FEV1-Pre L 1.79  FEV1-Predicted Pre % 78  FEV1-Post L 1.92  DLCO UNC% % 70  DLCO COR %Predicted % 113  TLC L 3.69  TLC % Predicted % 72  RV % Predicted % 55    WALK:  No flowsheet data found.  Imaging: DG Chest 2 View  Result Date: 06/23/2019 CLINICAL DATA:  History of pneumonia. EXAM: CHEST - 2 VIEW COMPARISON:  03/30/2019.  10/16/2017.  CT 12/15/2010. FINDINGS: Mediastinum hilar structures normal. Heart size stable. Stable changes of bronchiectasis and scarring noted the right upper lobe and right middle lobe. No evidence of acute infiltrate noted on today's exam. No pleural effusion or pneumothorax. Diffuse osteopenia and degenerative change. IMPRESSION: Stable changes of bronchiectasis and scarring noted the right upper lobe and right middle lobe. No evidence of acute infiltrate noted on today's exam. Electronically Signed   By: [place]   On: 06/23/2019 05:49    Lab Results:  CBC    Component Value Date/Time   WBC 8.4 06/24/2019 1309   RBC 4.28 06/24/2019 1309   HGB 13.6 06/24/2019 1309   HCT 40.3 06/24/2019 1309  PLT 188.0 06/24/2019 1309   MCV 94.2 06/24/2019 1309   MCH 31.3 09/21/2014 0513   MCHC 33.8 06/24/2019 1309   RDW 13.0 06/24/2019 1309   LYMPHSABS 1.0 06/24/2019 1309   MONOABS 0.5 06/24/2019 1309   EOSABS 0.0 06/24/2019 1309   BASOSABS 0.0 06/24/2019 1309    BMET    Component Value Date/Time   NA 138 06/24/2019 1309   NA 142 11/12/2015 0000   K 4.4 06/24/2019 1309   CL 104 06/24/2019 1309   CO2 28 06/24/2019 1309   GLUCOSE 105 (H) 06/24/2019 1309    BUN 13 06/24/2019 1309   BUN 8 11/12/2015 0000   CREATININE 0.86 06/24/2019 1309   CREATININE 0.77 10/05/2015 1105   CALCIUM 9.4 06/24/2019 1309   GFRNONAA 80 10/05/2015 1105   GFRAA >89 10/05/2015 1105    BNP No results found for: BNP  ProBNP No results found for: PROBNP  Specialty Problems      Pulmonary Problems   Allergic rhinitis    Qualifier: Diagnosis of  By: Jenny Reichmann MD, Hunt Oris       Bronchiectasis [facility])   Shortness of breath      Allergies  Allergen Reactions  . Lamotrigine Swelling    Tongue swelling  . Bupropion Other (See Comments)    not known  . Carbamazepine Other (See Comments)    Hypnatremia  . Levetiracetam     Other reaction(s): Dizziness (intolerance)  . Tramadol Other (See Comments)    not known  . Adhesive [Tape]     ?  Blister after knee surgery  . Clarithromycin     Unsure of reaction    . Doxycycline     Interfered with seizure medication  . Nickel     Had to remove knee implant with nickel and replace it  . Other     Metal and perfumes  . Estradiol Rash  . Latex Rash  . Phenytoin Sodium Extended Other (See Comments)    drowsiness    Immunization History  Administered Date(s) Administered  . Fluad Quad(high Dose 65+) 01/27/2019  . Influenza Whole 01/19/2009, 12/19/2009  . Influenza, High Dose Seasonal PF 01/08/2015, 01/03/2016, 01/01/2017, 01/28/2018  . Influenza,inj,Quad PF,6+ Mos 01/24/2014  . Influenza-Unspecified 01/06/2012  . Pneumococcal Conjugate-13 09/19/2009  . Pneumococcal Polysaccharide-23 06/20/2013  . Tdap 05/30/2014  . Varicella 09/23/2010    Past Medical History:  Diagnosis Date  . Allergy   . Anxiety and depression 12/19/2009  . Arthritis   . Cancer (HCC)    vaginal  . Constipation   . Degenerative disorder of eye   . Diverticulosis   . Essential hypertension, benign 06/26/2009  . GERD 05/27/2007   takes Nexium daily  . Heart murmur    yrs ago  . History of gout   . History of migraine many yrs  ago  . Hyperlipidemia    takes Pravastatin daily  . Hypothyroidism   . Insomnia    but doesn't take any meds  . LOW BACK PAIN 05/27/2007  . Numbness    left toes   . Osteoporosis   . Seizures ([place])    last 1983, none since then- last seizure 1988 per pt     Tobacco History: Social History   Tobacco Use  Smoking Status Former Smoker  . Packs/day: 0.25  . Years: 20.00  . Pack years: 5.00  . Types: Cigarettes  . Quit date: 09/08/1978  . Years since quitting: 40.8  Smokeless Tobacco Never Used  Tobacco  Comment   quit smoking 81yrs ago   Counseling given: Yes Comment: quit smoking 38yrs ago   Continue to not smoke  Outpatient Encounter Medications as of 06/28/2019  Medication Sig  . albuterol (VENTOLIN HFA) 108 (90 Base) MCG/ACT inhaler Inhale 2 puffs into the lungs every 4 (four) hours as needed for wheezing or shortness of breath.  Marland Kitchen aspirin 81 MG tablet Take 81 mg by mouth daily.  . beta carotene w/minerals (OCUVITE) tablet Take 1 tablet by mouth daily.  . Cholecalciferol (VITAMIN D3 PO) Take by mouth.  . Cyanocobalamin (VITAMIN B 12 PO) Take by mouth daily.    . cyclobenzaprine (FLEXERIL) 10 MG tablet Take 1 tablet (10 mg total) by mouth 2 (two) times daily as needed for muscle spasms.  Marland Kitchen dicyclomine (BENTYL) 10 MG capsule Take 1 capsule (10 mg total) by mouth every 8 (eight) hours as needed for spasms.  Marland Kitchen esomeprazole (NEXIUM) 20 MG capsule Take 2 capsules (40 mg total) by mouth daily before breakfast.  . fluticasone (FLONASE) 50 MCG/ACT nasal spray Place 2 sprays into both nostrils daily.  Marland Kitchen levocetirizine (XYZAL) 5 MG tablet Take by mouth daily.   Marland Kitchen levothyroxine (SYNTHROID) 88 MCG tablet TAKE 1 TABLET BY MOUTH EVERY DAY  . lisinopril (ZESTRIL) 5 MG tablet TAKE 1 TABLET BY MOUTH EVERY DAY  . methocarbamol (ROBAXIN) 500 MG tablet Take 1 tablet (500 mg total) by mouth 4 (four) times daily.  . ondansetron (ZOFRAN ODT) 4 MG disintegrating tablet Take 1 tablet (4 mg total)  by mouth every 6 (six) hours as needed for nausea or vomiting.  . pravastatin (PRAVACHOL) 20 MG tablet TAKE 1 TABLET BY MOUTH EVERY DAY  . promethazine (PHENERGAN) 12.5 MG tablet Take 1 tablet (12.5 mg total) by mouth every 8 (eight) hours as needed for nausea or vomiting.  . sertraline (ZOLOFT) 100 MG tablet TAKE 1 TABLET (100 MG TOTAL) BY MOUTH DAILY. TAKE WITH 25MG =125MG   . sertraline (ZOLOFT) 25 MG tablet TAKE 1 TABLET (25 MG TOTAL) BY MOUTH DAILY. TAKE WITH 100MG =125MG .  . Spacer/Aero-Holding Dorise Bullion Use as directed with inhaler  . zonisamide (ZONEGRAN) 100 MG capsule Take 200 mg by mouth 2 (two) times daily.  . [DISCONTINUED] amoxicillin-clavulanate (AUGMENTIN) 875-125 MG tablet Take 1 tablet by mouth 2 (two) times daily for 7 days.  . [DISCONTINUED] doxycycline (VIBRA-TABS) 100 MG tablet Take 1 tablet (100 mg total) by mouth 2 (two) times daily for 7 days.   Facility-Administered Encounter Medications as of 06/28/2019  Medication  . methylPREDNISolone acetate (DEPO-MEDROL) injection 40 mg     Review of Systems  Review of Systems  Constitutional: Positive for activity change (Patient leads sedentary lifestyle, she reports that it is difficult for her to increase her activity due to her shortness of breath as well as daytime sleepiness) and fatigue. Negative for fever.  HENT: Negative for sinus pressure, sinus pain and sore throat.   Respiratory: Positive for cough (occasional morning cough ) and shortness of breath. Negative for wheezing.   Cardiovascular: Negative for chest pain and palpitations.  Gastrointestinal: Negative for diarrhea, nausea and vomiting.  Musculoskeletal: Negative for arthralgias.  Neurological: Negative for dizziness.  Psychiatric/Behavioral: Positive for sleep disturbance. The patient is not nervous/anxious.      Physical Exam  BP 120/70   Pulse 83   Temp 98.4 F (36.9 C) (Temporal)   Ht 5\' 5"  (1.651 m)   Wt 236 lb 6.4 oz (107.2 kg)   SpO2 98%  Comment: on RA  BMI 39.34 kg/m   Wt Readings from Last 5 Encounters:  06/28/19 236 lb 6.4 oz (107.2 kg)  06/22/19 233 lb 9.6 oz (106 kg)  05/05/19 231 lb (104.8 kg)  05/02/19 231 lb (104.8 kg)  04/27/19 232 lb (105.2 kg)    BMI Readings from Last 5 Encounters:  06/28/19 39.34 kg/m  06/22/19 38.87 kg/m  05/05/19 38.44 kg/m  05/02/19 38.44 kg/m  04/27/19 38.61 kg/m     Physical Exam Vitals and nursing note reviewed.  Constitutional:      General: She is not in acute distress.    Appearance: Normal appearance. She is obese.  HENT:     Head: Normocephalic and atraumatic.     Right Ear: Tympanic membrane, ear canal and external ear normal. There is no impacted cerumen.     Left Ear: Tympanic membrane, ear canal and external ear normal. There is no impacted cerumen.     Nose: Rhinorrhea present. No congestion.     Mouth/Throat:     Mouth: Mucous membranes are moist.     Pharynx: Oropharynx is clear.  Eyes:     Pupils: Pupils are equal, round, and reactive to light.  Cardiovascular:     Rate and Rhythm: Normal rate and regular rhythm.     Pulses: Normal pulses.     Heart sounds: Normal heart sounds. No murmur.  Pulmonary:     Effort: Pulmonary effort is normal. No respiratory distress.     Breath sounds: No decreased air movement. No decreased breath sounds, wheezing or rales.     Comments: Scattered squeaks in bases Musculoskeletal:     Cervical back: Normal range of motion.  Skin:    General: Skin is warm and dry.     Capillary Refill: Capillary refill takes less than 2 seconds.  Neurological:     General: No focal deficit present.     Mental Status: She is alert and oriented to person, place, and time. Mental status is at baseline.     Gait: Gait normal.     Deep Tendon Reflexes: Reflexes abnormal.  Psychiatric:        Mood and Affect: Mood normal.        Behavior: Behavior normal.        Thought Content: Thought content normal.        Judgment: Judgment  normal.       Assessment & Plan:   Discussion: Unfortunately the patient continues to have worsened shortness of breath as well as poor sleep quality.  She was hopeful that we would be able to immediately address this today.  Unfortunately we need to proceed forward with the recommended testing from 2019 visit.  We will order a home sleep study I will try to get this coordinated quickly with in our office.  I will also order a high-resolution CT chest to further evaluate her dyspnea as well as to get better imaging of her bronchiectasis.  Last CT imaging was from 2012.  As I explained to the patient today there is not a simple quick fix for her shortness of breath.  I do believe this is likely multifactorial as it was explained to her in 2019 at our office.  I am hopeful that if we are able to improve her sleep quality she will be able to have decreased fatigue as well as improved energy to increase physical activity to help further evaluate and better manage her shortness of breath.  ANA positive Unsure recent positive ANA is  of much clinical significance with such a low titer  Plan: Encouraged patient to follow back up with primary care to see if they would like to refer patient to rheumatology   Excessive daytime sleepiness Still have persistent concerns regarding patient's sleep quality I agree that patient needs to proceed forward with home sleep study as originally discussed in 2019  Plan: We will order home sleep study We will follow-up with patient care coordinators to see if we can coordinate this quickly, given patient's current symptoms  Healthcare maintenance Patient was unsure if she would be a good candidate for the COVID-19 vaccine given her history of bronchiectasis  Plan: Explained to patient that we do encourage that she receives the COVID-19 vaccine She will work on coordinating  Shortness of breath I still believe the patient's shortness of breath is likely  multifactorial given her suspected sleep apnea, sedentary lifestyle, and deconditioning.  Pulmonary function tests were fairly stable at last office visit.  Given her continued symptoms as well as known radiology findings of bronchiectasis we will order CT imaging.  Plan: High-resolution CT chest ordered May need to consider walk in office at next follow-up visit Encouraged patient to increase her physical activity  Bronchiectasis ([place]) Seen on 2012 CTA chest Stable findings on most recent chest x-ray in March/2021 Patient reports an occasional dry morning cough History of recurrent pneumonias, sinus infections and significant allergies Patient reports continued postnasal drip  Plan: High-resolution CT chest ordered Patient likely would benefit from a flutter valve after we obtain CT imaging to further evaluate    Return in about 6 weeks (around 08/09/2019), or if symptoms worsen or fail to improve, for Follow up with Dr. Ander Slade, After Chest CT.   Lauraine Rinne, NP 06/28/2019   This appointment required 34 minutes of patient care (this includes precharting, chart review, review of results, face-to-face care, etc.).

## 2019-06-28 NOTE — Patient Instructions (Addendum)
You were seen today by Lauraine Rinne, NP  for:   Sorry to hear that you are having the shortness of breath.  I am glad that you decided to return to our office.  We do need to order a home sleep study to further evaluate your sleep cycle.  If you have obstructive sleep apnea that could be a large cause of your continued fatigue and shortness of breath.  I will also order a high-resolution CT of your chest to further evaluate your shortness of breath as well as known bronchiectasis.  We do recommend that you receive your Covid vaccine.  Please contact our office if you are having difficulty getting scheduled for the home sleep study or if no one has contacted you for the CT scan.  We will have you keep close follow-up with our office in 4 to 6 weeks with an appointment with Dr. Ander Slade  Take care and stay safe,   Aaron Edelman     1. Shortness of breath/ Bronchiectasis   - CT Chest High Resolution; Future  Bronchiectasis: This is the medical term which indicates that you have damage, dilated airways making you more susceptible to respiratory infection. Let us know if you have cough with change in mucus color or fevers or chills.  At that point you would need an antibiotic. Maintain a healthy nutritious diet, eating whole foods Take your medications as prescribed    2. Excessive daytime sleepiness  - Home sleep test; Future  3. Healthcare maintenance  We do recommend that you receive the COVID-19 vaccine  COVID-19 Vaccine Information can be found at: ShippingScam.co.uk For questions related to vaccine distribution or appointments, please email vaccine@[email or web address] .com or call [phone].   4. ANA positive  ANA is positive, titer is low  Discussed with primary care to see if they would like to refer you to rheumatology   We recommend today:  Orders Placed This Encounter  Procedures  . CT Chest High Resolution    -High-res CT  with supine and prone positioning -inspiratory and expiratory cuts.  -Only to be read by Dr. Rosario Jacks and Dr. Weber Cooks.    Standing Status:   Future    Standing Expiration Date:   08/27/2020    Scheduling Instructions:     Schedule within next 4-8 weeks    Order Specific Question:   Preferred imaging location?    Answer:   [place]    Order Specific Question:   Radiology Contrast Protocol - do NOT remove file path    Answer:   [email or web address]  . Home sleep test    Standing Status:   Future    Standing Expiration Date:   06/27/2020    Order Specific Question:   Where should this test be performed:    Answer:   [facility]   Orders Placed This Encounter  Procedures  . CT Chest High Resolution  . Home sleep test   No orders of the defined types were placed in this encounter.   Follow Up:    Return in about 6 weeks (around 08/09/2019), or if symptoms worsen or fail to improve, for Follow up with Dr. Ander Slade, After Chest CT.   Please do your part to reduce the spread of COVID-19:      Reduce your risk of any infection  and COVID19 by using the similar precautions used for avoiding the common cold or flu:  Marland Kitchen Wash your hands often with soap and warm water for  at least 20 seconds.  If soap and water are not readily available, use an alcohol-based hand sanitizer with at least 60% alcohol.  . If coughing or sneezing, cover your mouth and nose by coughing or sneezing into the elbow areas of your shirt or coat, into a tissue or into your sleeve (not your hands). Langley Gauss A MASK when in public  . Avoid shaking hands with others and consider head nods or verbal greetings only. . Avoid touching your eyes, nose, or mouth with unwashed hands.  . Avoid close contact with people who are sick. . Avoid places or events with large numbers of people in one location, like concerts or sporting events. . If you have some symptoms but not all symptoms, continue  to monitor at home and seek medical attention if your symptoms worsen. . If you are having a medical emergency, call 911.   [place] / e-Visit: [email or web address]         [facility]: [facility]: [id number]                   [facility]: [id number]     It is flu season:   >>> Best ways to protect herself from the flu: Receive the yearly flu vaccine, practice good hand hygiene washing with soap and also using hand sanitizer when available, eat a nutritious meals, get adequate rest, hydrate appropriately   Please contact the office if your symptoms worsen or you have concerns that you are not improving.   Thank you for choosing [facility] for your healthcare, and for allowing [place] to partner with you on your healthcare journey. I am thankful to be able to provide care to you today.   Wyn Quaker FNP-C

## 2019-06-28 NOTE — Assessment & Plan Note (Signed)
Unsure recent positive ANA is of much clinical significance with such a low titer  Plan: Encouraged patient to follow back up with primary care to see if they would like to refer patient to rheumatology

## 2019-06-28 NOTE — Assessment & Plan Note (Signed)
Still have persistent concerns regarding patient's sleep quality I agree that patient needs to proceed forward with home sleep study as originally discussed in 2019  Plan: We will order home sleep study We will follow-up with patient care coordinators to see if we can coordinate this quickly, given patient's current symptoms

## 2019-06-28 NOTE — Assessment & Plan Note (Signed)
Patient was unsure if she would be a good candidate for the COVID-19 vaccine given her history of bronchiectasis  Plan: Explained to patient that we do encourage that she receives the COVID-19 vaccine She will work on coordinating

## 2019-06-29 NOTE — Addendum Note (Signed)
Addended by: Gwenyth Ober R on: 06/29/2019 08:49 AM   Modules accepted: Orders

## 2019-06-30 ENCOUNTER — Ambulatory Visit: Payer: Medicare HMO

## 2019-06-30 ENCOUNTER — Other Ambulatory Visit: Payer: Self-pay

## 2019-06-30 DIAGNOSIS — G4719 Other hypersomnia: Secondary | ICD-10-CM

## 2019-06-30 DIAGNOSIS — G4733 Obstructive sleep apnea (adult) (pediatric): Secondary | ICD-10-CM

## 2019-06-30 NOTE — Progress Notes (Signed)
This might not be possible for her pending ongoing symptoms, but just looking through her medications, and sometimes there can be a build up of medication in the system that can contribute to not feeling well. The big offenders on her list (or meds that she could try to limit if possible) are:  *bentyl *phenergan *flexeril *methocarbamol   Just thinking if she can limit use when able, this might help in terms of her energy/feeling better.   I will keep following along with the pulmonology work up. But just wanted to bring this to her attention.

## 2019-07-01 DIAGNOSIS — G4733 Obstructive sleep apnea (adult) (pediatric): Secondary | ICD-10-CM

## 2019-07-04 ENCOUNTER — Encounter: Payer: Self-pay | Admitting: Internal Medicine

## 2019-07-04 ENCOUNTER — Ambulatory Visit: Payer: Medicare HMO | Admitting: Internal Medicine

## 2019-07-04 ENCOUNTER — Other Ambulatory Visit: Payer: Self-pay

## 2019-07-04 VITALS — BP 130/70 | HR 81 | Ht 65.0 in | Wt 233.0 lb

## 2019-07-04 DIAGNOSIS — E890 Postprocedural hypothyroidism: Secondary | ICD-10-CM | POA: Diagnosis not present

## 2019-07-04 MED ORDER — LEVOTHYROXINE SODIUM 88 MCG PO TABS: 88.0000 ug | ORAL_TABLET | Freq: Every day | ORAL | 3 refills | Status: DC

## 2019-07-04 NOTE — Patient Instructions (Addendum)
Please continue Levothyroxine 88 mcg daily in am.  Take the thyroid hormone every day, with water, at least 30 minutes before breakfast, separated by at least 4 hours from: - acid reflux medications - calcium - iron - multivitamins  Try to take Nexium >4h after levothyroxine.  Please return for labs in 5-6 months - please make sure you are off Biotin x 1 week before labs.  Please come back for a follow-up appointment in 1 year.

## 2019-07-04 NOTE — Progress Notes (Signed)
Patient ID: Beverly Ballard, female   DOB: 04/13/1946, 74 y.o.   MRN: [id number]   This visit occurred during the SARS-CoV-2 public health emergency.  Safety protocols were in place, including screening questions prior to the visit, additional usage of staff PPE, and extensive cleaning of exam room while observing appropriate contact time as indicated for disinfecting solutions.   HPI  Beverly Ballard is a 74 y.o.-year-old female, returning for follow-up for  uncontrolled post ablative hypothyroidism.  She saw Dr. Wilson Singer before, but came to see me after he retired.  Last visit 1 year and 4 months ago.  She had PNA 03/2019. She still feel tired.  Reviewed history: Patient was diagnosed with hypothyroidism in the 80s after RAI treatment for Graves' disease.  She was initially started on Synthroid, now on levothyroxine.  She has been on high doses of levothyroxine, even 150 mcg for several years before she started to see Dr. Maudie Mercury, who has been trying to reduce her levothyroxine dose to improve her TFTs to the normal range.  However, since TFTs were fluctuating, she was referred to endocrinology.  She was prev. on 100 mcg levothyroxine daily.  After we optimized how she was taking the medication (see below), her TSH became suppressed again >> dose decreased to 88 mcg daily. Subsequent TFTs were normal.  Patient continues on levothyroxine 88 mcg daily: - in am, moved from that time, which she was taking at last visit. - fasting - at least 1h from b'fast - no Ca, MVI - on PPIs 3h after LT4 -?Biotin  On B12 now every other day.  Reviewed her TFTs: Lab Results  Component Value Date   TSH 3.34 06/24/2019   TSH 1.46 12/30/2018   TSH 1.46 04/28/2018   TSH 1.04 03/15/2018   TSH 0.58 11/04/2017   TSH 0.09 (L) 09/16/2017   TSH 1.25 04/16/2017   TSH 0.18 (L) 02/06/2017   TSH 0.12 (L) 12/23/2016   TSH 0.11 (L) 11/06/2016   FREET4 0.91 04/28/2018   FREET4 0.77 11/04/2017   FREET4 0.89 09/16/2017    FREET4 0.77 04/16/2017   FREET4 0.87 02/10/2017    Pt denies: - feeling nodules in neck - hoarseness - dysphagia - choking - SOB with lying down  She has no FH of thyroid disorders. + FH of thyroid cancer (half sister by mother). No FH of thyroid cancer. No h/o radiation tx to head or neck except RAI treatment in the 80s.  No seaweed or kelp. No recent contrast studies. No herbal supplements. No Biotin use. No recent steroids use.   Pt. also has a history of epilepsy ( last seizure 1988), HTN, HL, GERD, anxiety + depression - on Zoloft She also has a history of low abdominal pain that has been extensively investigated in the past.  She is seeing Dr. Havery Moros.  Her mother lives in [place].  ROS: Constitutional: no weight gain/no weight loss, + fatigue, no subjective hyperthermia, no subjective hypothermia Eyes: no blurry vision, no xerophthalmia ENT: + sore throat, + see HPI Cardiovascular: no CP/+ SOB/no palpitations/no leg swelling Respiratory: no cough/+ SOB/+ wheezing Gastrointestinal: no N/no V/no D/no C/no acid reflux Musculoskeletal: no muscle aches/no joint aches Skin: no rashes, no hair loss Neurological: + Tremors/no numbness/no tingling/no dizziness  I reviewed pt's medications, allergies, PMH, social hx, family hx, and changes were documented in the history of present illness. Otherwise, unchanged from my initial visit note.   Past Medical History:  Diagnosis Date  . Allergy   .  Anxiety and depression 12/19/2009  . Arthritis   . Cancer (HCC)    vaginal  . Constipation   . Degenerative disorder of eye   . Diverticulosis   . Essential hypertension, benign 06/26/2009  . GERD 05/27/2007   takes Nexium daily  . Heart murmur    yrs ago  . History of gout   . History of migraine many yrs ago  . Hyperlipidemia    takes Pravastatin daily  . Hypothyroidism   . Insomnia    but doesn't take any meds  . LOW BACK PAIN 05/27/2007  . Numbness    left toes   .  Osteoporosis   . Seizures ([facility])    last 1983, none since then- last seizure 1988 per pt    Past Surgical History:  Procedure Laterality Date  . ADENOIDECTOMY    . BACK SURGERY     lumbar x2  . COLONOSCOPY  2009  . ESOPHAGOGASTRODUODENOSCOPY    . Heel spurs Bilateral   . KNEE ARTHROPLASTY Left 11/07/2013   Procedure: COMPUTER ASSISTED TOTAL KNEE ARTHROPLASTY;  Surgeon: Marybelle Killings, MD;  Location: [facility];  Service: Orthopedics;  Laterality: Left;  Left Total Knee Arthroplasty, Cemented, Computer Assist  . TONSILLECTOMY    . TOTAL KNEE REVISION Left 09/19/2014   Procedure: LEFT TOTAL KNEE REVISION;  Surgeon: Leandrew Koyanagi, MD;  Location: [facility];  Service: Orthopedics;  Laterality: Left;  . UPPER GASTROINTESTINAL ENDOSCOPY     Social History   Socioeconomic History  . Marital status: Married    Spouse name: Not on file  . Number of children: 1  . Years of education: Not on file  . Highest education level: Not on file  Occupational History  . Occupation: Retired  Tobacco Use  . Smoking status: Former Smoker    Packs/day: 0.25    Years: 20.00    Pack years: 5.00    Types: Cigarettes    Quit date: 09/08/1978    Years since quitting: 40.8  . Smokeless tobacco: Never Used  . Tobacco comment: quit smoking 70yrs ago  Substance and Sexual Activity  . Alcohol use: No  . Drug use: No  . Sexual activity: Never    Birth control/protection: Post-menopausal  Other Topics Concern  . Not on file  Social History Narrative  . Not on file   Social Determinants of Health   Financial Resource Strain:   . Difficulty of Paying Living Expenses:   Food Insecurity:   . Worried About Charity fundraiser in the Last Year:   . Arboriculturist in the Last Year:   Transportation Needs:   . Film/video editor (Medical):   Marland Kitchen Lack of Transportation (Non-Medical):   Physical Activity:   . Days of Exercise per Week:   . Minutes of Exercise per Session:   Stress:   . Feeling of Stress :    Social Connections:   . Frequency of Communication with Friends and Family:   . Frequency of Social Gatherings with Friends and Family:   . Attends Religious Services:   . Active Member of Clubs or Organizations:   . Attends Archivist Meetings:   Marland Kitchen Marital Status:   Intimate Partner Violence:   . Fear of Current or Ex-Partner:   . Emotionally Abused:   Marland Kitchen Physically Abused:   . Sexually Abused:    Current Outpatient Medications on File Prior to Visit  Medication Sig Dispense Refill  . albuterol (VENTOLIN HFA)  108 (90 Base) MCG/ACT inhaler Inhale 2 puffs into the lungs every 4 (four) hours as needed for wheezing or shortness of breath. 18 g 0  . aspirin 81 MG tablet Take 81 mg by mouth daily.    . beta carotene w/minerals (OCUVITE) tablet Take 1 tablet by mouth daily.    . Cholecalciferol (VITAMIN D3 PO) Take by mouth.    . Cyanocobalamin (VITAMIN B 12 PO) Take by mouth daily.      . cyclobenzaprine (FLEXERIL) 10 MG tablet Take 1 tablet (10 mg total) by mouth 2 (two) times daily as needed for muscle spasms. 30 tablet 0  . dicyclomine (BENTYL) 10 MG capsule Take 1 capsule (10 mg total) by mouth every 8 (eight) hours as needed for spasms. 30 capsule 3  . esomeprazole (NEXIUM) 20 MG capsule Take 2 capsules (40 mg total) by mouth daily before breakfast. 2 capsule 0  . fluticasone (FLONASE) 50 MCG/ACT nasal spray Place 2 sprays into both nostrils daily. 16 g 6  . levocetirizine (XYZAL) 5 MG tablet Take by mouth daily.     Marland Kitchen levothyroxine (SYNTHROID) 88 MCG tablet TAKE 1 TABLET BY MOUTH EVERY DAY 90 tablet 1  . lisinopril (ZESTRIL) 5 MG tablet TAKE 1 TABLET BY MOUTH EVERY DAY 90 tablet 1  . methocarbamol (ROBAXIN) 500 MG tablet Take 1 tablet (500 mg total) by mouth 4 (four) times daily. 30 tablet 0  . ondansetron (ZOFRAN ODT) 4 MG disintegrating tablet Take 1 tablet (4 mg total) by mouth every 6 (six) hours as needed for nausea or vomiting. 30 tablet 3  . pravastatin (PRAVACHOL) 20  MG tablet TAKE 1 TABLET BY MOUTH EVERY DAY 90 tablet 1  . promethazine (PHENERGAN) 12.5 MG tablet Take 1 tablet (12.5 mg total) by mouth every 8 (eight) hours as needed for nausea or vomiting. 30 tablet 1  . sertraline (ZOLOFT) 100 MG tablet TAKE 1 TABLET (100 MG TOTAL) BY MOUTH DAILY. TAKE WITH 25MG =125MG  90 tablet 1  . sertraline (ZOLOFT) 25 MG tablet TAKE 1 TABLET (25 MG TOTAL) BY MOUTH DAILY. TAKE WITH 100MG =125MG . 90 tablet 1  . Spacer/Aero-Holding Dorise Bullion Use as directed with inhaler 1 each 0  . zonisamide (ZONEGRAN) 100 MG capsule Take 200 mg by mouth 2 (two) times daily.     Current Facility-Administered Medications on File Prior to Visit  Medication Dose Route Frequency Provider Last Rate Last Admin  . methylPREDNISolone acetate (DEPO-MEDROL) injection 40 mg  40 mg Intra-articular Once Hilts, [name], MD       Allergies  Allergen Reactions  . Lamotrigine Swelling    Tongue swelling  . Bupropion Other (See Comments)    not known  . Carbamazepine Other (See Comments)    Hypnatremia  . Levetiracetam     Other reaction(s): Dizziness (intolerance)  . Tramadol Other (See Comments)    not known  . Adhesive [Tape]     ?  Blister after knee surgery  . Clarithromycin     Unsure of reaction    . Doxycycline     Interfered with seizure medication  . Nickel     Had to remove knee implant with nickel and replace it  . Other     Metal and perfumes  . Estradiol Rash  . Latex Rash  . Phenytoin Sodium Extended Other (See Comments)    drowsiness   Family History  Problem Relation Age of Onset  . Aortic dissection Father   . Arthritis Mother   . Stroke  Mother 24  . Heart murmur Other   . Colon cancer Neg Hx   . Esophageal cancer Neg Hx   . Stomach cancer Neg Hx   . Rectal cancer Neg Hx   . Colon polyps Neg Hx    PE: BP 130/70   Pulse 81   Ht 5\' 5"  (1.651 m)   Wt 233 lb (105.7 kg)   SpO2 94%   BMI 38.77 kg/m  Wt Readings from Last 3 Encounters:  07/04/19 233 lb  (105.7 kg)  06/28/19 236 lb 6.4 oz (107.2 kg)  06/22/19 233 lb 9.6 oz (106 kg)   Constitutional: overweight, in NAD Eyes: PERRLA, EOMI, no exophthalmos ENT: moist mucous membranes, no thyromegaly, no cervical lymphadenopathy Cardiovascular: RRR, No MRG Respiratory: B wheezes upper lungs and L lower lung, no crackles Gastrointestinal: abdomen soft, NT, ND, BS+ Musculoskeletal: no deformities, strength intact in all 4 Skin: moist, warm, no rashes Neurological: + tremor with outstretched hands, DTR normal in all 4  ASSESSMENT: 1.  Post ablative hypothyroidism - After RAI treatment for Graves' disease in the 80s.  PLAN:  1. Patient with longstanding, previously uncontrolled post ablative hypothyroidism, on levothyroxine therapy.  She has been on a high dose of levothyroxine in the past, with persistently suppressed TSH levels.  She is also not taking the levothyroxine correctly and we discussed how to do so.  Afterwards, we could decrease her dose of levothyroxine from 112 to 88 mcg daily and her TFTs normalized.  She was now lost for follow-up for a year and 4 months. - latest thyroid labs reviewed with pt >> normal earlier this mo: Lab Results  Component Value Date   TSH 3.34 06/24/2019   - she continues on LT4 88 mcg daily - pt feels good on this dose - but gained 13 lbs since last visit and she still feels very tired - we discussed about taking the thyroid hormone every day, with water, >30 minutes before breakfast, separated by >4 hours from acid reflux medications, calcium, iron, multivitamins. Pt. is taking it in am now.  At last visit she was taking it at bedtime. She takes a PPI in am but separates it from LT4 by ~3 h >> will move this later >4h after LT4 - will check thyroid tests in 5-6 mo: TSH and fT4 -Otherwise, I will see her back in a year  Orders Placed This Encounter  Procedures  . TSH  . T4, free   Philemon Kingdom, MD PhD [facility]

## 2019-07-05 ENCOUNTER — Telehealth: Payer: Self-pay | Admitting: *Deleted

## 2019-07-05 NOTE — Telephone Encounter (Signed)
-----   Message from Caren Macadam, MD sent at 06/30/2019  8:51 PM EST -----   ----- Message ----- From: Lauraine Rinne, NP Sent: 06/28/2019  12:49 PM EST To: Caren Macadam, MD

## 2019-07-05 NOTE — Telephone Encounter (Signed)
Spoke with the pt and informed her of the message below.  Patient agreed to limit use of the medications when she can.

## 2019-07-08 ENCOUNTER — Ambulatory Visit (INDEPENDENT_AMBULATORY_CARE_PROVIDER_SITE_OTHER): Payer: Medicare HMO | Admitting: Family Medicine

## 2019-07-08 ENCOUNTER — Encounter: Payer: Self-pay | Admitting: Family Medicine

## 2019-07-08 ENCOUNTER — Other Ambulatory Visit: Payer: Self-pay

## 2019-07-08 VITALS — BP 118/56 | HR 78 | Temp 97.6°F | Ht 65.0 in | Wt 234.0 lb

## 2019-07-08 DIAGNOSIS — J029 Acute pharyngitis, unspecified: Secondary | ICD-10-CM

## 2019-07-08 DIAGNOSIS — N390 Urinary tract infection, site not specified: Secondary | ICD-10-CM | POA: Diagnosis not present

## 2019-07-08 LAB — URINALYSIS, ROUTINE W REFLEX MICROSCOPIC
Bilirubin Urine: NEGATIVE
Ketones, ur: NEGATIVE
Nitrite: NEGATIVE
Specific Gravity, Urine: 1.01 (ref 1.000–1.030)
Total Protein, Urine: NEGATIVE
Urine Glucose: NEGATIVE
Urobilinogen, UA: 0.2 — AB (ref 0.0–1.0)
pH: 6 (ref 5.0–8.0)

## 2019-07-08 MED ORDER — CIPROFLOXACIN HCL 500 MG PO TABS: 500.0000 mg | ORAL_TABLET | Freq: Two times a day (BID) | ORAL | 0 refills | Status: DC

## 2019-07-08 NOTE — Progress Notes (Signed)
   Subjective:    Patient ID: Beverly Ballard, female    DOB: [birth year], 74 y.o.   MRN: [id number]  HPI Here for 2 issues. First one week ago she developed a severe ST. She took Tylenol and is drinking fluids. Since then the ST has improved daily and now it hardly bothers her. No fever or cough or headache. However she has noticed some white spots on the back of her throat. Second, yesterday she developed urgency and frequency of urinations, with some burning. The urine has a strong odor to it. No back pain or fever.    Review of Systems  Constitutional: Negative.   HENT: Positive for mouth sores and sore throat. Negative for congestion, ear pain, postnasal drip, rhinorrhea, sinus pressure, sinus pain and trouble swallowing.   Eyes: Negative.   Respiratory: Negative.   Cardiovascular: Negative.   Gastrointestinal: Negative.   Genitourinary: Positive for dysuria, frequency and urgency. Negative for flank pain and hematuria.       Objective:   Physical Exam Constitutional:      Appearance: Normal appearance. She is not ill-appearing.  HENT:     Right Ear: Tympanic membrane, ear canal and external ear normal.     Left Ear: Tympanic membrane, ear canal and external ear normal.     Nose: Nose normal.     Mouth/Throat:     Pharynx: No oropharyngeal exudate or posterior oropharyngeal erythema.     Comments: There is a single shallow white ulcer in the left tonsillar area Cardiovascular:     Rate and Rhythm: Normal rate and regular rhythm.     Pulses: Normal pulses.     Heart sounds: Normal heart sounds.  Pulmonary:     Effort: Pulmonary effort is normal.     Breath sounds: Normal breath sounds.  Abdominal:     General: Abdomen is flat. Bowel sounds are normal. There is no distension.     Palpations: Abdomen is soft. There is no mass.     Tenderness: There is no abdominal tenderness. There is right CVA tenderness. There is no left CVA tenderness, guarding or rebound.     Hernia: No  hernia is present.  Lymphadenopathy:     Cervical: No cervical adenopathy.  Neurological:     Mental Status: She is alert.           Assessment & Plan:  She has a UTI, and we will treat this with Cipro. Culture the sample. She is recovering from a viral pharyngitis. She can use warm salt water gargles for discomfort. Recheck prn.  Alysia Penna, MD

## 2019-07-08 NOTE — Telephone Encounter (Signed)
I checked on AO's desk and did not see a sleep study for this patient.  Will await sleep study results.  AO it looks like sleep study is in pt's chart, but if we need to print this for you next week let us know.  Thanks!

## 2019-07-08 NOTE — Telephone Encounter (Signed)
AO please advise on pt's HST results from 06/30/2019 in pt chart.  Thanks!

## 2019-07-08 NOTE — Telephone Encounter (Signed)
Back in the office on Monday. Not sure if its one of the two still left to read on my desk.

## 2019-07-10 LAB — URINE CULTURE
MICRO NUMBER:: 10271153
SPECIMEN QUALITY:: ADEQUATE

## 2019-07-26 ENCOUNTER — Other Ambulatory Visit: Payer: Self-pay

## 2019-07-27 ENCOUNTER — Encounter: Payer: Self-pay | Admitting: Family Medicine

## 2019-07-27 ENCOUNTER — Encounter: Payer: Self-pay | Admitting: Neurology

## 2019-07-27 ENCOUNTER — Ambulatory Visit (INDEPENDENT_AMBULATORY_CARE_PROVIDER_SITE_OTHER): Payer: Medicare HMO | Admitting: Family Medicine

## 2019-07-27 VITALS — BP 122/60 | HR 78 | Temp 98.1°F | Ht 65.0 in | Wt 236.0 lb

## 2019-07-27 DIAGNOSIS — I10 Essential (primary) hypertension: Secondary | ICD-10-CM

## 2019-07-27 DIAGNOSIS — M8580 Other specified disorders of bone density and structure, unspecified site: Secondary | ICD-10-CM | POA: Diagnosis not present

## 2019-07-27 DIAGNOSIS — J3081 Allergic rhinitis due to animal (cat) (dog) hair and dander: Secondary | ICD-10-CM | POA: Diagnosis not present

## 2019-07-27 DIAGNOSIS — R700 Elevated erythrocyte sedimentation rate: Secondary | ICD-10-CM | POA: Diagnosis not present

## 2019-07-27 DIAGNOSIS — R042 Hemoptysis: Secondary | ICD-10-CM | POA: Diagnosis not present

## 2019-07-27 DIAGNOSIS — E785 Hyperlipidemia, unspecified: Secondary | ICD-10-CM

## 2019-07-27 DIAGNOSIS — J3089 Other allergic rhinitis: Secondary | ICD-10-CM | POA: Diagnosis not present

## 2019-07-27 DIAGNOSIS — R0602 Shortness of breath: Secondary | ICD-10-CM | POA: Diagnosis not present

## 2019-07-27 DIAGNOSIS — K219 Gastro-esophageal reflux disease without esophagitis: Secondary | ICD-10-CM

## 2019-07-27 DIAGNOSIS — E890 Postprocedural hypothyroidism: Secondary | ICD-10-CM

## 2019-07-27 DIAGNOSIS — F3341 Major depressive disorder, recurrent, in partial remission: Secondary | ICD-10-CM

## 2019-07-27 DIAGNOSIS — G40909 Epilepsy, unspecified, not intractable, without status epilepticus: Secondary | ICD-10-CM | POA: Diagnosis not present

## 2019-07-27 DIAGNOSIS — J301 Allergic rhinitis due to pollen: Secondary | ICD-10-CM | POA: Diagnosis not present

## 2019-07-27 DIAGNOSIS — R69 Illness, unspecified: Secondary | ICD-10-CM | POA: Diagnosis not present

## 2019-07-27 LAB — CBC WITH DIFFERENTIAL/PLATELET
Basophils Absolute: 0 10*3/uL (ref 0.0–0.1)
Basophils Relative: 0.2 % (ref 0.0–3.0)
Eosinophils Absolute: 0 10*3/uL (ref 0.0–0.7)
Eosinophils Relative: 0.2 % (ref 0.0–5.0)
HCT: 39.8 % (ref 36.0–46.0)
Hemoglobin: 13.2 g/dL (ref 12.0–15.0)
Lymphocytes Relative: 21.3 % (ref 12.0–46.0)
Lymphs Abs: 1.6 10*3/uL (ref 0.7–4.0)
MCHC: 33.1 g/dL (ref 30.0–36.0)
MCV: 95 fl (ref 78.0–100.0)
Monocytes Absolute: 0.6 10*3/uL (ref 0.1–1.0)
Monocytes Relative: 8.2 % (ref 3.0–12.0)
Neutro Abs: 5.2 10*3/uL (ref 1.4–7.7)
Neutrophils Relative %: 70.1 % (ref 43.0–77.0)
Platelets: 187 10*3/uL (ref 150.0–400.0)
RBC: 4.19 Mil/uL (ref 3.87–5.11)
RDW: 13.3 % (ref 11.5–15.5)
WBC: 7.4 10*3/uL (ref 4.0–10.5)

## 2019-07-27 LAB — SEDIMENTATION RATE: Sed Rate: 37 mm/hr — ABNORMAL HIGH (ref 0–30)

## 2019-07-27 LAB — PROTIME-INR
INR: 1.1 ratio — ABNORMAL HIGH (ref 0.8–1.0)
Prothrombin Time: 12.3 s (ref 9.6–13.1)

## 2019-07-27 NOTE — Progress Notes (Signed)
Beverly Ballard DOB: Oct 25, 1945 Encounter date: 07/27/2019  This is a 74 y.o. female who presents with Chief Complaint  Patient presents with  . Follow-up    History of present illness: Since last visit with me she has seen Dr. Ludwig Clarks for UTI and also recovered from pharyngitis. Has seen endo and made small adjustment with timing of synthroid. Also saw pulmonology and repeat sleep study scheduled (hasn't heard results) as well as CT (scheduled for 26th) for SOB.  Still not feeling well. Has coughed up some blood - first time was a lot; at first was just liquid; then has had multiple episodes coughing up blood in phlegm. Still feels short of breath; still feels really tired. This is first time she has coughed up blood. This has occurred in morning. No sinus congestion, drainage, and no epistaxis. No fevers.   Getting fed up with feeling unwell. Short of breath and tired which limits all her energy. Doesn't want to go anywhere or do anything. Getting more depressed. Made self go out on Saturday and then had Easter with daughter which was nice and pretty much only activity she has done.   Feels drained just all the time. Even small things around house are draining. Sleeps after breakfast.   HTN: On lisinopril 5mg  daily.  GERD:on nexium for this. Occasional symptoms, but doubles up for a week and then it resolves.  Hypothyroid:synthroid 67mcg daily. Follows with Dr. Cruzita Lederer.  Seizure disorder: last seizure 1988. Does still follow with neurology in [place].  Osteopenia: followed by gyn.  Allergies: getting allergy shot.   HL:on pravastatin 20mg  daily. No side effects with medication.   Depression/Fatigue:  IBS: bowels have been doing ok. No blood in stools, no diarrhea.   Arthritis: back is bothering her. Has been taking more pain medication and rubbing more cream on it. Does have follow up with rheum coming up (June 1st)  Allergies  Allergen Reactions  . Lamotrigine Swelling   Tongue swelling  . Bupropion Other (See Comments)    not known  . Carbamazepine Other (See Comments)    Hypnatremia  . Levetiracetam     Other reaction(s): Dizziness (intolerance)  . Tramadol Other (See Comments)    not known  . Adhesive [Tape]     ?  Blister after knee surgery  . Clarithromycin     Unsure of reaction    . Doxycycline     Interfered with seizure medication  . Nickel     Had to remove knee implant with nickel and replace it  . Other     Metal and perfumes  . Estradiol Rash  . Latex Rash  . Phenytoin Sodium Extended Other (See Comments)    drowsiness   Current Meds  Medication Sig  . albuterol (VENTOLIN HFA) 108 (90 Base) MCG/ACT inhaler Inhale 2 puffs into the lungs every 4 (four) hours as needed for wheezing or shortness of breath.  Marland Kitchen aspirin 81 MG tablet Take 81 mg by mouth daily.  . beta carotene w/minerals (OCUVITE) tablet Take 1 tablet by mouth daily.  . Cholecalciferol (VITAMIN D3 PO) Take by mouth.  . Cyanocobalamin (VITAMIN B 12 PO) Take by mouth daily.    . cyclobenzaprine (FLEXERIL) 10 MG tablet Take 1 tablet (10 mg total) by mouth 2 (two) times daily as needed for muscle spasms.  Marland Kitchen dicyclomine (BENTYL) 10 MG capsule Take 1 capsule (10 mg total) by mouth every 8 (eight) hours as needed for spasms.  Marland Kitchen esomeprazole ([place])  20 MG capsule Take 2 capsules (40 mg total) by mouth daily before breakfast.  . fluticasone (FLONASE) 50 MCG/ACT nasal spray Place 2 sprays into both nostrils daily.  Marland Kitchen levocetirizine (XYZAL) 5 MG tablet Take by mouth daily.   Marland Kitchen levothyroxine (SYNTHROID) 88 MCG tablet Take 1 tablet (88 mcg total) by mouth daily.  Marland Kitchen lisinopril (ZESTRIL) 5 MG tablet TAKE 1 TABLET BY MOUTH EVERY DAY  . methocarbamol (ROBAXIN) 500 MG tablet Take 1 tablet (500 mg total) by mouth 4 (four) times daily.  . ondansetron (ZOFRAN ODT) 4 MG disintegrating tablet Take 1 tablet (4 mg total) by mouth every 6 (six) hours as needed for nausea or vomiting.  .  pravastatin (PRAVACHOL) 20 MG tablet TAKE 1 TABLET BY MOUTH EVERY DAY  . sertraline (ZOLOFT) 100 MG tablet TAKE 1 TABLET (100 MG TOTAL) BY MOUTH DAILY. TAKE WITH 25MG =125MG   . sertraline (ZOLOFT) 25 MG tablet TAKE 1 TABLET (25 MG TOTAL) BY MOUTH DAILY. TAKE WITH 100MG =125MG .  . Spacer/Aero-Holding Dorise Bullion Use as directed with inhaler  . zonisamide (ZONEGRAN) 100 MG capsule Take 200 mg by mouth 2 (two) times daily.    Review of Systems  Constitutional: Positive for activity change (decreased) and fatigue. Negative for chills and fever.  HENT: Negative for congestion, ear pain and sore throat.   Respiratory: Positive for cough and shortness of breath. Negative for chest tightness and wheezing.   Cardiovascular: Negative for chest pain, palpitations and leg swelling.  Gastrointestinal: Negative for abdominal distention, abdominal pain, blood in stool, constipation, diarrhea, nausea and vomiting.  Genitourinary: Negative for difficulty urinating, dysuria and hematuria.  Musculoskeletal: Positive for arthralgias (chronic) and neck stiffness (chronic). Negative for myalgias.  Skin: Positive for pallor.  Neurological: Negative for dizziness, light-headedness and headaches.  Psychiatric/Behavioral: Negative for sleep disturbance and suicidal ideas.       Mood is depressed    Objective:  BP 122/60 (BP Location: Left Arm, Patient Position: Sitting, Cuff Size: Large)   Pulse 78   Temp 98.1 F (36.7 C) (Temporal)   Ht 5\' 5"  (1.651 m)   Wt 236 lb (107 kg)   SpO2 95%   BMI 39.27 kg/m   Weight: 236 lb (107 kg)   BP Readings from Last 3 Encounters:  07/27/19 122/60  07/08/19 (!) 118/56  07/04/19 130/70   Wt Readings from Last 3 Encounters:  07/27/19 236 lb (107 kg)  07/08/19 234 lb (106.1 kg)  07/04/19 233 lb (105.7 kg)    Physical Exam Constitutional:      General: She is not in acute distress.    Appearance: She is well-developed.  Cardiovascular:     Rate and Rhythm: Normal  rate and regular rhythm.     Heart sounds: Normal heart sounds. No murmur. No friction rub.  Pulmonary:     Effort: Pulmonary effort is normal. No respiratory distress.     Breath sounds: Normal breath sounds. No wheezing or rales.  Musculoskeletal:     Right lower leg: No edema.     Left lower leg: No edema.  Skin:    General: Skin is warm.     Coloration: Skin is pale.  Neurological:     Mental Status: She is alert and oriented to person, place, and time.  Psychiatric:        Mood and Affect: Mood is depressed.        Speech: Speech is delayed.        Behavior: Behavior normal.  Thought Content: Thought content does not include suicidal ideation. Thought content does not include suicidal plan.        Cognition and Memory: Cognition normal.     Comments: She is tearful during exam. She is frustrated with not feeling well and wonders what the point of muramidase she feels this way.  She would not hurt herself.     Assessment/Plan  1. Essential hypertension Blood pressure is well controlled.  Continue current medications. - CBC with Differential/Platelet; Future - CBC with Differential/Platelet  2. Gastroesophageal reflux disease, unspecified whether esophagitis present Acid reflux is controlled.  Continue current medication.  3. Postablative hypothyroidism Thyroid is been well controlled.  Patient follows up regularly with endocrinology.  4. Seizure disorder [facility]) She has not had any seizures since the 80s, but would like to establish care with neurology that is connected with our health system and closer to home for her. - Ambulatory referral to Neurology  5. Osteopenia, unspecified location Works on weightbearing exercise and adequate calcium and vitamin D intake.  6. Hyperlipidemia, unspecified hyperlipidemia type Tolerates pravastatin.  7. Recurrent major depressive disorder, in partial remission (HCC) Mood is poor currently.  She is taking Zoloft regularly,  but fatigue and shortness of breath are affecting her on a daily basis.  We discussed not changing mood medications at this point but working on diagnosis and management of current physical complaints.  8. Shortness of breath This is not improving.  She had been treated for pneumonia previously, but continues to feel shortness of breath and have cough.  We were able to arrange for an earlier CT scan (she will be getting it done in 2 days) and I will copy pulmonology on this note for follow-up. - D-dimer, quantitative (not at [facility]); Future - D-dimer, quantitative (not at [facility])  9. Elevated sed rate We will recheck for stability. - Sedimentation rate; Future - Sedimentation rate  10. Hemoptysis This is new in the last week.  Will make pulmonology aware.  I feel the next step is the CT scan and we discussed that she may need to follow-up for further evaluation) like bronchoscopy) pending CT results. - Protime-INR; Future - Protime-INR    Return for pending labs/imaging.    Micheline Rough, MD

## 2019-07-28 LAB — D-DIMER, QUANTITATIVE: D-Dimer, Quant: 0.95 mcg/mL FEU — ABNORMAL HIGH (ref <0.50)

## 2019-07-29 ENCOUNTER — Ambulatory Visit (INDEPENDENT_AMBULATORY_CARE_PROVIDER_SITE_OTHER)
Admission: RE | Admit: 2019-07-29 | Discharge: 2019-07-29 | Disposition: A | Discharge status: Home / Self Care | Payer: Medicare HMO | Source: Ambulatory Visit | Attending: Family Medicine | Admitting: Family Medicine

## 2019-07-29 ENCOUNTER — Ambulatory Visit (INDEPENDENT_AMBULATORY_CARE_PROVIDER_SITE_OTHER)
Admission: RE | Admit: 2019-07-29 | Discharge: 2019-07-29 | Disposition: A | Discharge status: Home / Self Care | Payer: Medicare HMO | Source: Ambulatory Visit | Attending: Pulmonary Disease | Admitting: Pulmonary Disease

## 2019-07-29 ENCOUNTER — Other Ambulatory Visit: Payer: Self-pay

## 2019-07-29 DIAGNOSIS — R0602 Shortness of breath: Secondary | ICD-10-CM | POA: Diagnosis not present

## 2019-07-29 DIAGNOSIS — R918 Other nonspecific abnormal finding of lung field: Secondary | ICD-10-CM | POA: Diagnosis not present

## 2019-07-29 DIAGNOSIS — J301 Allergic rhinitis due to pollen: Secondary | ICD-10-CM | POA: Diagnosis not present

## 2019-07-29 DIAGNOSIS — J3081 Allergic rhinitis due to animal (cat) (dog) hair and dander: Secondary | ICD-10-CM | POA: Diagnosis not present

## 2019-07-29 DIAGNOSIS — J3089 Other allergic rhinitis: Secondary | ICD-10-CM | POA: Diagnosis not present

## 2019-07-29 MED ORDER — IOHEXOL 350 MG/ML SOLN
80.0000 mL | Freq: Once | INTRAVENOUS | Status: AC | PRN
  Administered 2019-07-29: 80 mL via INTRAVENOUS

## 2019-07-29 NOTE — Addendum Note (Signed)
Addended by: Agnes Lawrence on: 07/29/2019 10:03 AM   Modules accepted: Orders

## 2019-08-02 DIAGNOSIS — J301 Allergic rhinitis due to pollen: Secondary | ICD-10-CM | POA: Diagnosis not present

## 2019-08-02 DIAGNOSIS — J3081 Allergic rhinitis due to animal (cat) (dog) hair and dander: Secondary | ICD-10-CM | POA: Diagnosis not present

## 2019-08-02 DIAGNOSIS — J3089 Other allergic rhinitis: Secondary | ICD-10-CM | POA: Diagnosis not present

## 2019-08-05 DIAGNOSIS — J301 Allergic rhinitis due to pollen: Secondary | ICD-10-CM | POA: Diagnosis not present

## 2019-08-05 DIAGNOSIS — J3089 Other allergic rhinitis: Secondary | ICD-10-CM | POA: Diagnosis not present

## 2019-08-05 DIAGNOSIS — J3081 Allergic rhinitis due to animal (cat) (dog) hair and dander: Secondary | ICD-10-CM | POA: Diagnosis not present

## 2019-08-09 DIAGNOSIS — J3081 Allergic rhinitis due to animal (cat) (dog) hair and dander: Secondary | ICD-10-CM | POA: Diagnosis not present

## 2019-08-09 DIAGNOSIS — J301 Allergic rhinitis due to pollen: Secondary | ICD-10-CM | POA: Diagnosis not present

## 2019-08-09 DIAGNOSIS — J3089 Other allergic rhinitis: Secondary | ICD-10-CM | POA: Diagnosis not present

## 2019-08-12 DIAGNOSIS — J301 Allergic rhinitis due to pollen: Secondary | ICD-10-CM | POA: Diagnosis not present

## 2019-08-12 DIAGNOSIS — J3081 Allergic rhinitis due to animal (cat) (dog) hair and dander: Secondary | ICD-10-CM | POA: Diagnosis not present

## 2019-08-12 DIAGNOSIS — J3089 Other allergic rhinitis: Secondary | ICD-10-CM | POA: Diagnosis not present

## 2019-08-15 ENCOUNTER — Other Ambulatory Visit: Payer: Medicare HMO

## 2019-08-17 ENCOUNTER — Encounter: Payer: Self-pay | Admitting: Pulmonary Disease

## 2019-08-17 ENCOUNTER — Ambulatory Visit: Payer: Medicare HMO | Admitting: Pulmonary Disease

## 2019-08-17 ENCOUNTER — Other Ambulatory Visit: Payer: Self-pay

## 2019-08-17 VITALS — BP 122/74 | HR 74 | Temp 98.0°F | Ht 65.0 in | Wt 235.0 lb

## 2019-08-17 DIAGNOSIS — G4733 Obstructive sleep apnea (adult) (pediatric): Secondary | ICD-10-CM

## 2019-08-17 NOTE — Progress Notes (Signed)
Beverly Ballard    IP:[id number]    [birth year]  Primary Care Physician:Koberlein, Steele Berg, MD  Referring Physician: Caren Macadam, [place],  [place] [address]  Chief complaint:   Chronic shortness of breath of about 2 years duration She was initially seen in the office in 2019  Seen in March 2021 with persistent symptoms    HPI: Since the visit in March she has had a sleep study done which revealed moderate obstructive sleep apnea High-resolution CT scan of the chest concerning for hypersensitivity pneumonitis  She does suffer from chronic allergies and takes allergy shots Sleep quality still poor Cough which is occasionally productive of clear phlegm  No weight loss, no night sweats She does have chronic musculoskeletal pain chronic back pain, chronic knee pain  Admits to snoring, husband did make a recording of her with possible witnessed apneas Her sleep is nonstructured-goes to bed between 11 and 2 AM Wakes up between 7 and 9 AM Except about 2-3 times a night  Shortness of breath on exertion She tries to stay active She did smoke in the past-claims never to be a heavy smoker She does have a history of allergies for which she receives allergy shots  Pets: No pets Occupation: Worked retail Exposures: No mold exposure Smoking history: Quit smoking many years ago Travel history: Visits to [place] frequently Relevant family history: No contributory family history of lung disease  Outpatient Encounter Medications as of 08/17/2019  Medication Sig  . albuterol (VENTOLIN HFA) 108 (90 Base) MCG/ACT inhaler Inhale 2 puffs into the lungs every 4 (four) hours as needed for wheezing or shortness of breath.  Marland Kitchen aspirin 81 MG tablet Take 81 mg by mouth daily.  . beta carotene w/minerals (OCUVITE) tablet Take 1 tablet by mouth daily.  . Cholecalciferol (VITAMIN D3 PO) Take by mouth.  . Cyanocobalamin (VITAMIN B 12 PO) Take by mouth daily.    .  cyclobenzaprine (FLEXERIL) 10 MG tablet Take 1 tablet (10 mg total) by mouth 2 (two) times daily as needed for muscle spasms.  Marland Kitchen dicyclomine (BENTYL) 10 MG capsule Take 1 capsule (10 mg total) by mouth every 8 (eight) hours as needed for spasms.  Marland Kitchen esomeprazole (NEXIUM) 20 MG capsule Take 2 capsules (40 mg total) by mouth daily before breakfast.  . fluticasone (FLONASE) 50 MCG/ACT nasal spray Place 2 sprays into both nostrils daily.  Marland Kitchen levocetirizine (XYZAL) 5 MG tablet Take by mouth daily.   Marland Kitchen levothyroxine (SYNTHROID) 88 MCG tablet Take 1 tablet (88 mcg total) by mouth daily.  Marland Kitchen lisinopril (ZESTRIL) 5 MG tablet TAKE 1 TABLET BY MOUTH EVERY DAY  . methocarbamol (ROBAXIN) 500 MG tablet Take 1 tablet (500 mg total) by mouth 4 (four) times daily.  . ondansetron (ZOFRAN ODT) 4 MG disintegrating tablet Take 1 tablet (4 mg total) by mouth every 6 (six) hours as needed for nausea or vomiting.  . pravastatin (PRAVACHOL) 20 MG tablet TAKE 1 TABLET BY MOUTH EVERY DAY  . sertraline (ZOLOFT) 100 MG tablet TAKE 1 TABLET (100 MG TOTAL) BY MOUTH DAILY. TAKE WITH 25MG =125MG   . sertraline (ZOLOFT) 25 MG tablet TAKE 1 TABLET (25 MG TOTAL) BY MOUTH DAILY. TAKE WITH 100MG =125MG .  . Spacer/Aero-Holding Dorise Bullion Use as directed with inhaler  . zonisamide (ZONEGRAN) 100 MG capsule Take 200 mg by mouth 2 (two) times daily.   No facility-administered encounter medications on file as of 08/17/2019.  Allergies as of 08/17/2019 - Review Complete 08/17/2019  Allergen Reaction Noted  . Lamotrigine Swelling 07/03/2015  . Bupropion Other (See Comments) 05/02/2016  . Carbamazepine Other (See Comments) 07/03/2015  . Levetiracetam  07/03/2015  . Tramadol Other (See Comments) 05/02/2016  . Adhesive [tape]  09/08/2014  . Clarithromycin    . Doxycycline    . Nickel  08/11/2014  . Other  08/11/2014  . Estradiol Rash 07/03/2015  . Latex Rash 01/28/2018  . Phenytoin sodium extended Other (See Comments) 07/03/2015     Past Medical History:  Diagnosis Date  . Allergy   . Anxiety and depression 12/19/2009  . Arthritis   . Cancer (HCC)    vaginal  . Constipation   . Degenerative disorder of eye   . Diverticulosis   . Essential hypertension, benign 06/26/2009  . GERD 05/27/2007   takes Nexium daily  . Heart murmur    yrs ago  . History of gout   . History of migraine many yrs ago  . Hyperlipidemia    takes Pravastatin daily  . Hypothyroidism   . Insomnia    but doesn't take any meds  . LOW BACK PAIN 05/27/2007  . Numbness    left toes   . Osteoporosis   . Seizures ([place])    last 1983, none since then- last seizure 1988 per pt     Past Surgical History:  Procedure Laterality Date  . ADENOIDECTOMY    . BACK SURGERY     lumbar x2  . COLONOSCOPY  2009  . ESOPHAGOGASTRODUODENOSCOPY    . Heel spurs Bilateral   . KNEE ARTHROPLASTY Left 11/07/2013   Procedure: COMPUTER ASSISTED TOTAL KNEE ARTHROPLASTY;  Surgeon: Marybelle Killings, MD;  Location: [facility];  Service: Orthopedics;  Laterality: Left;  Left Total Knee Arthroplasty, Cemented, Computer Assist  . TONSILLECTOMY    . TOTAL KNEE REVISION Left 09/19/2014   Procedure: LEFT TOTAL KNEE REVISION;  Surgeon: Leandrew Koyanagi, MD;  Location: [facility];  Service: Orthopedics;  Laterality: Left;  . UPPER GASTROINTESTINAL ENDOSCOPY      Family History  Problem Relation Age of Onset  . Aortic dissection Father   . Arthritis Mother   . Stroke Mother 64  . Heart murmur Other   . Colon cancer Neg Hx   . Esophageal cancer Neg Hx   . Stomach cancer Neg Hx   . Rectal cancer Neg Hx   . Colon polyps Neg Hx     Social History   Socioeconomic History  . Marital status: Married    Spouse name: Not on file  . Number of children: 1  . Years of education: Not on file  . Highest education level: Not on file  Occupational History  . Occupation: Retired  Tobacco Use  . Smoking status: Former Smoker    Packs/day: 0.25    Years: 20.00    Pack years: 5.00     Types: Cigarettes    Quit date: 09/08/1978    Years since quitting: 40.9  . Smokeless tobacco: Never Used  . Tobacco comment: quit smoking 60yrs ago  Substance and Sexual Activity  . Alcohol use: No  . Drug use: No  . Sexual activity: Never    Birth control/protection: Post-menopausal  Other Topics Concern  . Not on file  Social History Narrative  . Not on file   Social Determinants of Health   Financial Resource Strain:   . Difficulty of Paying Living Expenses:   Food Insecurity:   .  Worried About Charity fundraiser in the Last Year:   . Arboriculturist in the Last Year:   Transportation Needs:   . Film/video editor (Medical):   Marland Kitchen Lack of Transportation (Non-Medical):   Physical Activity:   . Days of Exercise per Week:   . Minutes of Exercise per Session:   Stress:   . Feeling of Stress :   Social Connections:   . Frequency of Communication with Friends and Family:   . Frequency of Social Gatherings with Friends and Family:   . Attends Religious Services:   . Active Member of Clubs or Organizations:   . Attends Archivist Meetings:   Marland Kitchen Marital Status:   Intimate Partner Violence:   . Fear of Current or Ex-Partner:   . Emotionally Abused:   Marland Kitchen Physically Abused:   . Sexually Abused:     Review of Systems  Constitutional: Positive for fatigue.  HENT: Negative for postnasal drip and rhinorrhea.   Eyes: Negative.   Respiratory: Positive for cough and shortness of breath.   Cardiovascular: Negative.   Gastrointestinal: Negative.   Endocrine: Negative.   Genitourinary: Negative.   Musculoskeletal: Positive for arthralgias.       Knee pain  Allergic/Immunologic: Positive for environmental allergies.  Neurological: Negative.   Hematological: Negative.   Psychiatric/Behavioral: Positive for dysphoric mood and sleep disturbance.  All other systems reviewed and are negative.   Vitals:   08/17/19 1334  BP: 122/74  Pulse: 74  Temp: 98 F (36.7 C)   SpO2: 97%     Physical Exam  Constitutional: She appears well-developed and well-nourished. No distress.  HENT:  Head: Normocephalic and atraumatic.  Eyes: Pupils are equal, round, and reactive to light. Right eye exhibits no discharge. Left eye exhibits no discharge.  Neck: No tracheal deviation present. No thyromegaly present.  Cardiovascular: Normal rate and regular rhythm.  Pulmonary/Chest: Effort normal and breath sounds normal. No respiratory distress. She has no wheezes.  Breath sounds coarse at the bases  Musculoskeletal:     Cervical back: Normal range of motion and neck supple.  Skin: She is not diaphoretic.  Psychiatric: Her behavior is normal. Thought content normal.   Data Reviewed: Prominent interstitial markings on x-ray reviewed by myself-10/16/2017 Pulmonary function study-no obstruction, mild restriction  High-resolution CT scan of the chest was reviewed with the patient  Polysomnogram did reveal moderate obstructive sleep apnea  Assessment:  Chronic shortness of breath-multifactorial -Part of this may be deconditioning limitations from a chronic back pain -Abnormal CT scan of the chest consistent with chronic hypersensitivity pneumonitis -Patient does have chronic environmental allergies for which she receives allergy shots, multiple allergens  Moderate obstructive sleep apnea -Will recommend CPAP treatment  Significant back pain and knee pain  Previous smoker, significant exposure to secondhand smoke in the past-no underlying lung disease known -PFT does not reveal any significant obstruction  Plan/Recommendations:  Continue to use albuterol as needed  Regular exercises encouraged  DME referral for CPAP initiation -Auto CPAP 5-20 with heated humidification  We will schedule bronchoscopy for further evaluation of chronic changes which is currently consistent with chronic hypersensitivity pneumonitis  Follow-up in 4 weeks  Sherrilyn Rist  MD [facility] 08/17/2019, 7:21 PM  CC: Caren Macadam, MD

## 2019-08-17 NOTE — H&P (View-Only) (Signed)
BLERTA ANTWINE    IP:[id number]    Aug 04, 1945  Primary Care Physician:Koberlein, Steele Berg, MD  Referring Physician: Caren Macadam, [place],   [id number]  Chief complaint:   Chronic shortness of breath of about 2 years duration She was initially seen in the office in 2019  Seen in March 2021 with persistent symptoms    HPI: Since the visit in March she has had a sleep study done which revealed moderate obstructive sleep apnea High-resolution CT scan of the chest concerning for hypersensitivity pneumonitis  She does suffer from chronic allergies and takes allergy shots Sleep quality still poor Cough which is occasionally productive of clear phlegm  No weight loss, no night sweats She does have chronic musculoskeletal pain chronic back pain, chronic knee pain  Admits to snoring, husband did make a recording of her with possible witnessed apneas Her sleep is nonstructured-goes to bed between 11 and 2 AM Wakes up between 7 and 9 AM Except about 2-3 times a night  Shortness of breath on exertion She tries to stay active She did smoke in the past-claims never to be a heavy smoker She does have a history of allergies for which she receives allergy shots  Pets: No pets Occupation: Worked retail Exposures: No mold exposure Smoking history: Quit smoking many years ago Travel history: Visits to [place] frequently Relevant family history: No contributory family history of lung disease  Outpatient Encounter Medications as of 08/17/2019  Medication Sig  . albuterol (VENTOLIN HFA) 108 (90 Base) MCG/ACT inhaler Inhale 2 puffs into the lungs every 4 (four) hours as needed for wheezing or shortness of breath.  Marland Kitchen aspirin 81 MG tablet Take 81 mg by mouth daily.  . beta carotene w/minerals (OCUVITE) tablet Take 1 tablet by mouth daily.  . Cholecalciferol (VITAMIN D3 PO) Take by mouth.  . Cyanocobalamin (VITAMIN B 12 PO) Take by mouth daily.    .  cyclobenzaprine (FLEXERIL) 10 MG tablet Take 1 tablet (10 mg total) by mouth 2 (two) times daily as needed for muscle spasms.  Marland Kitchen dicyclomine (BENTYL) 10 MG capsule Take 1 capsule (10 mg total) by mouth every 8 (eight) hours as needed for spasms.  Marland Kitchen esomeprazole (NEXIUM) 20 MG capsule Take 2 capsules (40 mg total) by mouth daily before breakfast.  . fluticasone (FLONASE) 50 MCG/ACT nasal spray Place 2 sprays into both nostrils daily.  Marland Kitchen levocetirizine (XYZAL) 5 MG tablet Take by mouth daily.   Marland Kitchen levothyroxine (SYNTHROID) 88 MCG tablet Take 1 tablet (88 mcg total) by mouth daily.  Marland Kitchen lisinopril (ZESTRIL) 5 MG tablet TAKE 1 TABLET BY MOUTH EVERY DAY  . methocarbamol (ROBAXIN) 500 MG tablet Take 1 tablet (500 mg total) by mouth 4 (four) times daily.  . ondansetron (ZOFRAN ODT) 4 MG disintegrating tablet Take 1 tablet (4 mg total) by mouth every 6 (six) hours as needed for nausea or vomiting.  . pravastatin (PRAVACHOL) 20 MG tablet TAKE 1 TABLET BY MOUTH EVERY DAY  . sertraline (ZOLOFT) 100 MG tablet TAKE 1 TABLET (100 MG TOTAL) BY MOUTH DAILY. TAKE WITH 25MG =125MG   . sertraline (ZOLOFT) 25 MG tablet TAKE 1 TABLET (25 MG TOTAL) BY MOUTH DAILY. TAKE WITH 100MG =125MG .  . Spacer/Aero-Holding Chambers DEVI Use as directed with inhaler  . zonisamide (ZONEGRAN) 100 MG capsule Take 200 mg by mouth 2 (two) times daily.   No facility-administered encounter medications on file as of 08/17/2019.  Allergies as of 08/17/2019 - Review Complete 08/17/2019  Allergen Reaction Noted  . Lamotrigine Swelling 07/03/2015  . Bupropion Other (See Comments) 05/02/2016  . Carbamazepine Other (See Comments) 07/03/2015  . Levetiracetam  07/03/2015  . Tramadol Other (See Comments) 05/02/2016  . Adhesive [tape]  09/08/2014  . Clarithromycin    . Doxycycline    . Nickel  08/11/2014  . Other  08/11/2014  . Estradiol Rash 07/03/2015  . Latex Rash 01/28/2018  . Phenytoin sodium extended Other (See Comments) 07/03/2015     Past Medical History:  Diagnosis Date  . Allergy   . Anxiety and depression 12/19/2009  . Arthritis   . Cancer (HCC)    vaginal  . Constipation   . Degenerative disorder of eye   . Diverticulosis   . Essential hypertension, benign 06/26/2009  . GERD 05/27/2007   takes Nexium daily  . Heart murmur    yrs ago  . History of gout   . History of migraine many yrs ago  . Hyperlipidemia    takes Pravastatin daily  . Hypothyroidism   . Insomnia    but doesn't take any meds  . LOW BACK PAIN 05/27/2007  . Numbness    left toes   . Osteoporosis   . Seizures ([place])    last 1983, none since then- last seizure 1988 per pt     Past Surgical History:  Procedure Laterality Date  . ADENOIDECTOMY    . BACK SURGERY     lumbar x2  . COLONOSCOPY  2009  . ESOPHAGOGASTRODUODENOSCOPY    . Heel spurs Bilateral   . KNEE ARTHROPLASTY Left 11/07/2013   Procedure: COMPUTER ASSISTED TOTAL KNEE ARTHROPLASTY;  Surgeon: Marybelle Killings, MD;  Location: [facility];  Service: Orthopedics;  Laterality: Left;  Left Total Knee Arthroplasty, Cemented, Computer Assist  . TONSILLECTOMY    . TOTAL KNEE REVISION Left 09/19/2014   Procedure: LEFT TOTAL KNEE REVISION;  Surgeon: Leandrew Koyanagi, MD;  Location: [facility];  Service: Orthopedics;  Laterality: Left;  . UPPER GASTROINTESTINAL ENDOSCOPY      Family History  Problem Relation Age of Onset  . Aortic dissection Father   . Arthritis Mother   . Stroke Mother 65  . Heart murmur Other   . Colon cancer Neg Hx   . Esophageal cancer Neg Hx   . Stomach cancer Neg Hx   . Rectal cancer Neg Hx   . Colon polyps Neg Hx     Social History   Socioeconomic History  . Marital status: Married    Spouse name: Not on file  . Number of children: 1  . Years of education: Not on file  . Highest education level: Not on file  Occupational History  . Occupation: Retired  Tobacco Use  . Smoking status: Former Smoker    Packs/day: 0.25    Years: 20.00    Pack years: 5.00     Types: Cigarettes    Quit date: 09/08/1978    Years since quitting: 40.9  . Smokeless tobacco: Never Used  . Tobacco comment: quit smoking 58yrs ago  Substance and Sexual Activity  . Alcohol use: No  . Drug use: No  . Sexual activity: Never    Birth control/protection: Post-menopausal  Other Topics Concern  . Not on file  Social History Narrative  . Not on file   Social Determinants of Health   Financial Resource Strain:   . Difficulty of Paying Living Expenses:   Food Insecurity:   .  Worried About Charity fundraiser in the Last Year:   . Arboriculturist in the Last Year:   Transportation Needs:   . Film/video editor (Medical):   Marland Kitchen Lack of Transportation (Non-Medical):   Physical Activity:   . Days of Exercise per Week:   . Minutes of Exercise per Session:   Stress:   . Feeling of Stress :   Social Connections:   . Frequency of Communication with Friends and Family:   . Frequency of Social Gatherings with Friends and Family:   . Attends Religious Services:   . Active Member of Clubs or Organizations:   . Attends Archivist Meetings:   Marland Kitchen Marital Status:   Intimate Partner Violence:   . Fear of Current or Ex-Partner:   . Emotionally Abused:   Marland Kitchen Physically Abused:   . Sexually Abused:     Review of Systems  Constitutional: Positive for fatigue.  HENT: Negative for postnasal drip and rhinorrhea.   Eyes: Negative.   Respiratory: Positive for cough and shortness of breath.   Cardiovascular: Negative.   Gastrointestinal: Negative.   Endocrine: Negative.   Genitourinary: Negative.   Musculoskeletal: Positive for arthralgias.       Knee pain  Allergic/Immunologic: Positive for environmental allergies.  Neurological: Negative.   Hematological: Negative.   Psychiatric/Behavioral: Positive for dysphoric mood and sleep disturbance.  All other systems reviewed and are negative.   Vitals:   08/17/19 1334  BP: 122/74  Pulse: 74  Temp: 98 F (36.7 C)   SpO2: 97%     Physical Exam  Constitutional: She appears well-developed and well-nourished. No distress.  HENT:  Head: Normocephalic and atraumatic.  Eyes: Pupils are equal, round, and reactive to light. Right eye exhibits no discharge. Left eye exhibits no discharge.  Neck: No tracheal deviation present. No thyromegaly present.  Cardiovascular: Normal rate and regular rhythm.  Pulmonary/Chest: Effort normal and breath sounds normal. No respiratory distress. She has no wheezes.  Breath sounds coarse at the bases  Musculoskeletal:     Cervical back: Normal range of motion and neck supple.  Skin: She is not diaphoretic.  Psychiatric: Her behavior is normal. Thought content normal.   Data Reviewed: Prominent interstitial markings on x-ray reviewed by myself-10/16/2017 Pulmonary function study-no obstruction, mild restriction  High-resolution CT scan of the chest was reviewed with the patient  Polysomnogram did reveal moderate obstructive sleep apnea  Assessment:  Chronic shortness of breath-multifactorial -Part of this may be deconditioning limitations from a chronic back pain -Abnormal CT scan of the chest consistent with chronic hypersensitivity pneumonitis -Patient does have chronic environmental allergies for which she receives allergy shots, multiple allergens  Moderate obstructive sleep apnea -Will recommend CPAP treatment  Significant back pain and knee pain  Previous smoker, significant exposure to secondhand smoke in the past-no underlying lung disease known -PFT does not reveal any significant obstruction  Plan/Recommendations:  Continue to use albuterol as needed  Regular exercises encouraged  DME referral for CPAP initiation -Auto CPAP 5-20 with heated humidification  We will schedule bronchoscopy for further evaluation of chronic changes which is currently consistent with chronic hypersensitivity pneumonitis  Follow-up in 4 weeks  Sherrilyn Rist  MD  [facility] 08/17/2019, 7:21 PM  CC: Caren Macadam, MD

## 2019-08-17 NOTE — Patient Instructions (Addendum)
Hypersensitivity pneumonitis on CT scan Moderate obstructive sleep apnea  DME referral for CPAP Auto CPAP 5-20, with heated humidification, patient's mask of choice  Schedule for bronchoscopy with biopsy  Continue other lines of care  Schedule follow-up for about 4 weeks  Call with concerns or questions

## 2019-08-18 DIAGNOSIS — J3081 Allergic rhinitis due to animal (cat) (dog) hair and dander: Secondary | ICD-10-CM | POA: Diagnosis not present

## 2019-08-18 DIAGNOSIS — J3089 Other allergic rhinitis: Secondary | ICD-10-CM | POA: Diagnosis not present

## 2019-08-18 DIAGNOSIS — J301 Allergic rhinitis due to pollen: Secondary | ICD-10-CM | POA: Diagnosis not present

## 2019-08-22 DIAGNOSIS — J3089 Other allergic rhinitis: Secondary | ICD-10-CM | POA: Diagnosis not present

## 2019-08-22 DIAGNOSIS — J301 Allergic rhinitis due to pollen: Secondary | ICD-10-CM | POA: Diagnosis not present

## 2019-08-22 DIAGNOSIS — J3081 Allergic rhinitis due to animal (cat) (dog) hair and dander: Secondary | ICD-10-CM | POA: Diagnosis not present

## 2019-08-24 ENCOUNTER — Telehealth: Payer: Self-pay | Admitting: Pulmonary Disease

## 2019-08-24 DIAGNOSIS — J301 Allergic rhinitis due to pollen: Secondary | ICD-10-CM | POA: Diagnosis not present

## 2019-08-24 DIAGNOSIS — J3081 Allergic rhinitis due to animal (cat) (dog) hair and dander: Secondary | ICD-10-CM | POA: Diagnosis not present

## 2019-08-24 DIAGNOSIS — J3089 Other allergic rhinitis: Secondary | ICD-10-CM | POA: Diagnosis not present

## 2019-08-24 NOTE — Telephone Encounter (Signed)
Called Beverly Ballard this afternoon to find out whether she has any questions about going ahead with a bronchoscopy-called her cell phone, also placed a call to landline  Left a message for her to call back with any questions or concerns  Questions or concerns, we can go ahead and schedule for bronchoscopy with biopsies  Concern is for chronic hypersensitivity pneumonitis

## 2019-08-26 DIAGNOSIS — J3081 Allergic rhinitis due to animal (cat) (dog) hair and dander: Secondary | ICD-10-CM | POA: Diagnosis not present

## 2019-08-26 DIAGNOSIS — J301 Allergic rhinitis due to pollen: Secondary | ICD-10-CM | POA: Diagnosis not present

## 2019-08-26 DIAGNOSIS — J3089 Other allergic rhinitis: Secondary | ICD-10-CM | POA: Diagnosis not present

## 2019-08-29 ENCOUNTER — Telehealth: Payer: Self-pay | Admitting: Pulmonary Disease

## 2019-08-29 DIAGNOSIS — J3081 Allergic rhinitis due to animal (cat) (dog) hair and dander: Secondary | ICD-10-CM | POA: Diagnosis not present

## 2019-08-29 DIAGNOSIS — J679 Hypersensitivity pneumonitis due to unspecified organic dust: Secondary | ICD-10-CM

## 2019-08-29 DIAGNOSIS — J3089 Other allergic rhinitis: Secondary | ICD-10-CM | POA: Diagnosis not present

## 2019-08-29 DIAGNOSIS — J301 Allergic rhinitis due to pollen: Secondary | ICD-10-CM | POA: Diagnosis not present

## 2019-08-29 NOTE — Telephone Encounter (Signed)
Pt returning call from Dr. Jenetta Downer. Can be reached at [phone]

## 2019-08-29 NOTE — Telephone Encounter (Signed)
Returned call to patient she does not have any questions and is ok to proceed with bronchoscopy.

## 2019-08-29 NOTE — Telephone Encounter (Signed)
Please schedule the following:  Diagnosis: Hypersensitivity pneumonitis Procedure: Bronchoscopy with transbronchial biopsies Anesthesia: Deep sedation Do you need Fluro?  Yes Priority: Routine Date: Flexible Alternate Date: Flexible Time: AM flexible/ PM flexible Location: Flexible Does patient have OSA?  No DM?  No or Latex allergy?  Yes Medication Restriction: None Anticoagulate/Antiplatelet: None Pre-op Labs Ordered: CBC, CMP, PT/INR, PTT Imaging request: None (If, SuperDimension CT Chest, please have STAT courier sent to [facility].)  Please coordinate Pre-op COVID Testing

## 2019-08-30 DIAGNOSIS — G4733 Obstructive sleep apnea (adult) (pediatric): Secondary | ICD-10-CM | POA: Diagnosis not present

## 2019-09-01 NOTE — Telephone Encounter (Signed)
Called and spoke with patient. Let them know their Bronch is scheduled for 5/24/21at [facility] with Dr. Ander Slade at  [phone].  Patient was instructed to arrive at hospital at [facility]. They were instructed to bring someone with them as they will not be able to drive home from procedure. Patient instructed not to have anything to eat or drink after midnight.  Patient's covid screening is scheduled at [facility] for 09/09/19 at 2pm.  Patient voiced understanding, nothing further needed  Routing to AO as FYI

## 2019-09-01 NOTE — Addendum Note (Signed)
Addended by: Amado Coe on: 09/01/2019 09:46 AM   Modules accepted: Orders

## 2019-09-02 DIAGNOSIS — J3089 Other allergic rhinitis: Secondary | ICD-10-CM | POA: Diagnosis not present

## 2019-09-02 DIAGNOSIS — J3081 Allergic rhinitis due to animal (cat) (dog) hair and dander: Secondary | ICD-10-CM | POA: Diagnosis not present

## 2019-09-02 DIAGNOSIS — J301 Allergic rhinitis due to pollen: Secondary | ICD-10-CM | POA: Diagnosis not present

## 2019-09-05 ENCOUNTER — Telehealth: Payer: Self-pay | Admitting: Pulmonary Disease

## 2019-09-05 DIAGNOSIS — G4733 Obstructive sleep apnea (adult) (pediatric): Secondary | ICD-10-CM

## 2019-09-05 NOTE — Telephone Encounter (Signed)
Called and spoke with pt. Pt is requesting a chin strap for her cpap mask. Verified pt's DME and have sent order to DME for chin strap. Nothing further needed.

## 2019-09-07 DIAGNOSIS — J301 Allergic rhinitis due to pollen: Secondary | ICD-10-CM | POA: Diagnosis not present

## 2019-09-07 DIAGNOSIS — J3081 Allergic rhinitis due to animal (cat) (dog) hair and dander: Secondary | ICD-10-CM | POA: Diagnosis not present

## 2019-09-07 DIAGNOSIS — J3089 Other allergic rhinitis: Secondary | ICD-10-CM | POA: Diagnosis not present

## 2019-09-07 DIAGNOSIS — G4733 Obstructive sleep apnea (adult) (pediatric): Secondary | ICD-10-CM | POA: Diagnosis not present

## 2019-09-08 NOTE — Progress Notes (Deleted)
Office Visit Note  Patient: Beverly Ballard             Date of Birth: Sep 15, 1945           MRN: [id number]             PCP: Caren Macadam, MD Referring: Caren Macadam, MD Visit Date: 09/20/2019 Occupation: @GUAROCC @  Subjective:  No chief complaint on file.   History of Present Illness: Beverly Ballard is a 74 y.o. female ***   Activities of Daily Living:  Patient reports morning stiffness for *** {minute/hour:19697}.   Patient {ACTIONS;DENIES/REPORTS:21021675::"Denies"} nocturnal pain.  Difficulty dressing/grooming: {ACTIONS;DENIES/REPORTS:21021675::"Denies"} Difficulty climbing stairs: {ACTIONS;DENIES/REPORTS:21021675::"Denies"} Difficulty getting out of chair: {ACTIONS;DENIES/REPORTS:21021675::"Denies"} Difficulty using hands for taps, buttons, cutlery, and/or writing: {ACTIONS;DENIES/REPORTS:21021675::"Denies"}  No Rheumatology ROS completed.   PMFS History:  Patient Active Problem List   Diagnosis Date Noted  . Excessive daytime sleepiness 06/28/2019  . Shortness of breath 06/28/2019  . Healthcare maintenance 06/28/2019  . ANA positive 06/28/2019  . Bronchiectasis ([place]) 06/28/2019  . Depression, major, single episode, moderate ([place]) 11/19/2018  . Fatigue 03/23/2018  . Recurrent major depressive disorder, in partial remission ([place]) 05/04/2017  . Left knee pain 01/12/2017  . Ovarian cyst 10/30/2016  . Aortic atherosclerosis ([place]) 11/19/2015  . Hyperlipemia 04/03/2015  . S/P revision of total knee 09/19/2014  . Seizure disorder ([place]) 01/24/2014  . Obesity, BMI unknown 01/24/2014  . Osteopenia, stable on dexa 2010 - followed by gyn 06/15/2012  . Pap smear abnormality of cervix/human papillomavirus (HPV) positive - 09/2011, followed by gyn 06/15/2012  . Essential hypertension 07/03/2009  . Hypothyroidism 05/27/2007  . Allergic rhinitis 05/27/2007  . GERD 05/27/2007    Past Medical History:  Diagnosis Date  . Allergy   . Anxiety and depression 12/19/2009   . Arthritis   . Cancer (HCC)    vaginal  . Constipation   . Degenerative disorder of eye   . Diverticulosis   . Essential hypertension, benign 06/26/2009  . GERD 05/27/2007   takes Nexium daily  . Heart murmur    yrs ago  . History of gout   . History of migraine many yrs ago  . Hyperlipidemia    takes Pravastatin daily  . Hypothyroidism   . Insomnia    but doesn't take any meds  . LOW BACK PAIN 05/27/2007  . Numbness    left toes   . Osteoporosis   . Seizures ([place])    last 1983, none since then- last seizure 1988 per pt     Family History  Problem Relation Age of Onset  . Aortic dissection Father   . Arthritis Mother   . Stroke Mother 58  . Heart murmur Other   . Colon cancer Neg Hx   . Esophageal cancer Neg Hx   . Stomach cancer Neg Hx   . Rectal cancer Neg Hx   . Colon polyps Neg Hx    Past Surgical History:  Procedure Laterality Date  . ADENOIDECTOMY    . BACK SURGERY     lumbar x2  . COLONOSCOPY  2009  . ESOPHAGOGASTRODUODENOSCOPY    . Heel spurs Bilateral   . KNEE ARTHROPLASTY Left 11/07/2013   Procedure: COMPUTER ASSISTED TOTAL KNEE ARTHROPLASTY;  Surgeon: Marybelle Killings, MD;  Location: [facility];  Service: Orthopedics;  Laterality: Left;  Left Total Knee Arthroplasty, Cemented, Computer Assist  . TONSILLECTOMY    . TOTAL KNEE REVISION Left 09/19/2014   Procedure: LEFT TOTAL KNEE REVISION;  Surgeon: Leandrew Koyanagi, MD;  Location: [facility];  Service: Orthopedics;  Laterality: Left;  . UPPER GASTROINTESTINAL ENDOSCOPY     Social History   Social History Narrative  . Not on file   Immunization History  Administered Date(s) Administered  . Fluad Quad(high Dose 65+) 01/27/2019  . Influenza Whole 01/19/2009, 12/19/2009  . Influenza, High Dose Seasonal PF 01/08/2015, 01/03/2016, 01/01/2017, 01/28/2018  . Influenza,inj,Quad PF,6+ Mos 01/24/2014  . Influenza-Unspecified 01/06/2012  . PFIZER SARS-COV-2 Vaccination 07/02/2019, 07/23/2019  . Pneumococcal Conjugate-13  09/19/2009  . Pneumococcal Polysaccharide-23 06/20/2013  . Tdap 05/30/2014  . Varicella 09/23/2010     Objective: Vital Signs: There were no vitals taken for this visit.   Physical Exam   Musculoskeletal Exam: ***  CDAI Exam: CDAI Score: -- Patient Global: --; Provider Global: -- Swollen: --; Tender: -- Joint Exam 09/20/2019   No joint exam has been documented for this visit   There is currently no information documented on the homunculus. Go to the Rheumatology activity and complete the homunculus joint exam.  Investigation: No additional findings.  Imaging: No results found.  Recent Labs: Lab Results  Component Value Date   WBC 7.4 07/27/2019   HGB 13.2 07/27/2019   PLT 187.0 07/27/2019   NA 138 06/24/2019   K 4.4 06/24/2019   CL 104 06/24/2019   CO2 28 06/24/2019   GLUCOSE 105 (H) 06/24/2019   BUN 13 06/24/2019   CREATININE 0.86 06/24/2019   BILITOT 0.3 06/24/2019   ALKPHOS 105 06/24/2019   AST 14 06/24/2019   ALT 10 06/24/2019   PROT 7.3 06/24/2019   ALBUMIN 4.0 06/24/2019   CALCIUM 9.4 06/24/2019   GFRAA >89 10/05/2015    Speciality Comments: No specialty comments available.  Procedures:  No procedures performed Allergies: Lamotrigine, Bupropion, Carbamazepine, Levetiracetam, Tramadol, Adhesive [tape], Clarithromycin, Doxycycline, Nickel, Other, Estradiol, Latex, and Phenytoin sodium extended   Assessment / Plan:     Visit Diagnoses: No diagnosis found.  Orders: No orders of the defined types were placed in this encounter.  No orders of the defined types were placed in this encounter.   Face-to-face time spent with patient was *** minutes. Greater than 50% of time was spent in counseling and coordination of care.  Follow-Up Instructions: No follow-ups on file.   Ofilia Neas, PA-C  Note - This record has been created using Dragon software.  Chart creation errors have been sought, but may not always  have been located. Such creation  errors do not reflect on  the standard of medical care.

## 2019-09-09 ENCOUNTER — Other Ambulatory Visit: Payer: Medicare HMO

## 2019-09-09 ENCOUNTER — Other Ambulatory Visit (HOSPITAL_COMMUNITY)
Admission: RE | Admit: 2019-09-09 | Discharge: 2019-09-09 | Disposition: A | Discharge status: Home / Self Care | Payer: Medicare HMO | Source: Ambulatory Visit | Attending: Pulmonary Disease | Admitting: Pulmonary Disease

## 2019-09-09 DIAGNOSIS — J3081 Allergic rhinitis due to animal (cat) (dog) hair and dander: Secondary | ICD-10-CM | POA: Diagnosis not present

## 2019-09-09 DIAGNOSIS — Z20822 Contact with and (suspected) exposure to covid-19: Secondary | ICD-10-CM | POA: Insufficient documentation

## 2019-09-09 DIAGNOSIS — Z01812 Encounter for preprocedural laboratory examination: Secondary | ICD-10-CM | POA: Diagnosis not present

## 2019-09-09 DIAGNOSIS — J301 Allergic rhinitis due to pollen: Secondary | ICD-10-CM | POA: Diagnosis not present

## 2019-09-09 DIAGNOSIS — J679 Hypersensitivity pneumonitis due to unspecified organic dust: Secondary | ICD-10-CM

## 2019-09-09 DIAGNOSIS — J3089 Other allergic rhinitis: Secondary | ICD-10-CM | POA: Diagnosis not present

## 2019-09-09 DIAGNOSIS — E890 Postprocedural hypothyroidism: Secondary | ICD-10-CM

## 2019-09-09 LAB — CBC WITH DIFFERENTIAL/PLATELET
Basophils Absolute: 0.1 10*3/uL (ref 0.0–0.1)
Basophils Relative: 0.9 % (ref 0.0–3.0)
Eosinophils Absolute: 0 10*3/uL (ref 0.0–0.7)
Eosinophils Relative: 0.1 % (ref 0.0–5.0)
HCT: 41.3 % (ref 36.0–46.0)
Hemoglobin: 13.6 g/dL (ref 12.0–15.0)
Lymphocytes Relative: 25 % (ref 12.0–46.0)
Lymphs Abs: 2.2 10*3/uL (ref 0.7–4.0)
MCHC: 33 g/dL (ref 30.0–36.0)
MCV: 94.8 fl (ref 78.0–100.0)
Monocytes Absolute: 0.6 10*3/uL (ref 0.1–1.0)
Monocytes Relative: 7.3 % (ref 3.0–12.0)
Neutro Abs: 5.8 10*3/uL (ref 1.4–7.7)
Neutrophils Relative %: 66.7 % (ref 43.0–77.0)
Platelets: 194 10*3/uL (ref 150.0–400.0)
RBC: 4.35 Mil/uL (ref 3.87–5.11)
RDW: 13.3 % (ref 11.5–15.5)
WBC: 8.7 10*3/uL (ref 4.0–10.5)

## 2019-09-09 LAB — COMPREHENSIVE METABOLIC PANEL
ALT: 11 U/L (ref 0–35)
AST: 16 U/L (ref 0–37)
Albumin: 4.1 g/dL (ref 3.5–5.2)
Alkaline Phosphatase: 93 U/L (ref 39–117)
BUN: 12 mg/dL (ref 6–23)
CO2: 29 mEq/L (ref 19–32)
Calcium: 9.2 mg/dL (ref 8.4–10.5)
Chloride: 105 mEq/L (ref 96–112)
Creatinine, Ser: 0.82 mg/dL (ref 0.40–1.20)
GFR: 68.61 mL/min (ref >60.00)
Glucose, Bld: 119 mg/dL — ABNORMAL HIGH (ref 70–99)
Potassium: 4.4 mEq/L (ref 3.5–5.1)
Sodium: 138 mEq/L (ref 135–145)
Total Bilirubin: 0.4 mg/dL (ref 0.2–1.2)
Total Protein: 7.5 g/dL (ref 6.0–8.3)

## 2019-09-09 LAB — PROTIME-INR
INR: 1.1 ratio — ABNORMAL HIGH (ref 0.8–1.0)
Prothrombin Time: 12 s (ref 9.6–13.1)

## 2019-09-09 LAB — APTT: aPTT: 30.6 s (ref 23.4–32.7)

## 2019-09-10 LAB — SARS CORONAVIRUS 2 (TAT 6-24 HRS): SARS Coronavirus 2: NEGATIVE

## 2019-09-12 ENCOUNTER — Ambulatory Visit (HOSPITAL_COMMUNITY): Payer: Medicare HMO

## 2019-09-12 ENCOUNTER — Encounter (HOSPITAL_COMMUNITY): Payer: Self-pay | Admitting: Pulmonary Disease

## 2019-09-12 ENCOUNTER — Encounter (HOSPITAL_COMMUNITY)
Admission: RE | Disposition: A | Discharge status: Home / Self Care | Payer: Self-pay | Source: Home / Self Care | Attending: Pulmonary Disease

## 2019-09-12 ENCOUNTER — Ambulatory Visit (HOSPITAL_COMMUNITY)
Admission: RE | Admit: 2019-09-12 | Discharge: 2019-09-12 | Disposition: A | Discharge status: Home / Self Care | Payer: Medicare HMO | Attending: Pulmonary Disease | Admitting: Pulmonary Disease

## 2019-09-12 ENCOUNTER — Other Ambulatory Visit: Payer: Self-pay

## 2019-09-12 ENCOUNTER — Ambulatory Visit (HOSPITAL_COMMUNITY): Payer: Medicare HMO | Admitting: Certified Registered Nurse Anesthetist

## 2019-09-12 ENCOUNTER — Telehealth: Payer: Self-pay | Admitting: Pulmonary Disease

## 2019-09-12 DIAGNOSIS — J309 Allergic rhinitis, unspecified: Secondary | ICD-10-CM | POA: Diagnosis not present

## 2019-09-12 DIAGNOSIS — R05 Cough: Secondary | ICD-10-CM | POA: Insufficient documentation

## 2019-09-12 DIAGNOSIS — R918 Other nonspecific abnormal finding of lung field: Secondary | ICD-10-CM | POA: Diagnosis not present

## 2019-09-12 DIAGNOSIS — E039 Hypothyroidism, unspecified: Secondary | ICD-10-CM | POA: Insufficient documentation

## 2019-09-12 DIAGNOSIS — I10 Essential (primary) hypertension: Secondary | ICD-10-CM | POA: Diagnosis not present

## 2019-09-12 DIAGNOSIS — Z6838 Body mass index (BMI) 38.0-38.9, adult: Secondary | ICD-10-CM | POA: Diagnosis not present

## 2019-09-12 DIAGNOSIS — F329 Major depressive disorder, single episode, unspecified: Secondary | ICD-10-CM | POA: Insufficient documentation

## 2019-09-12 DIAGNOSIS — Z7982 Long term (current) use of aspirin: Secondary | ICD-10-CM | POA: Insufficient documentation

## 2019-09-12 DIAGNOSIS — Z79890 Hormone replacement therapy (postmenopausal): Secondary | ICD-10-CM | POA: Diagnosis not present

## 2019-09-12 DIAGNOSIS — J679 Hypersensitivity pneumonitis due to unspecified organic dust: Secondary | ICD-10-CM | POA: Diagnosis not present

## 2019-09-12 DIAGNOSIS — Z79899 Other long term (current) drug therapy: Secondary | ICD-10-CM | POA: Insufficient documentation

## 2019-09-12 DIAGNOSIS — E785 Hyperlipidemia, unspecified: Secondary | ICD-10-CM | POA: Diagnosis not present

## 2019-09-12 DIAGNOSIS — M1990 Unspecified osteoarthritis, unspecified site: Secondary | ICD-10-CM | POA: Insufficient documentation

## 2019-09-12 DIAGNOSIS — R0602 Shortness of breath: Secondary | ICD-10-CM

## 2019-09-12 DIAGNOSIS — G4733 Obstructive sleep apnea (adult) (pediatric): Secondary | ICD-10-CM | POA: Diagnosis not present

## 2019-09-12 DIAGNOSIS — F419 Anxiety disorder, unspecified: Secondary | ICD-10-CM | POA: Diagnosis not present

## 2019-09-12 DIAGNOSIS — J9809 Other diseases of bronchus, not elsewhere classified: Secondary | ICD-10-CM | POA: Diagnosis not present

## 2019-09-12 DIAGNOSIS — J189 Pneumonia, unspecified organism: Secondary | ICD-10-CM

## 2019-09-12 DIAGNOSIS — R69 Illness, unspecified: Secondary | ICD-10-CM | POA: Diagnosis not present

## 2019-09-12 DIAGNOSIS — J939 Pneumothorax, unspecified: Secondary | ICD-10-CM

## 2019-09-12 HISTORY — PX: BRONCHIAL WASHINGS: SHX5105

## 2019-09-12 HISTORY — PX: VIDEO BRONCHOSCOPY: SHX5072

## 2019-09-12 HISTORY — PX: BRONCHIAL BIOPSY: SHX5109

## 2019-09-12 HISTORY — PX: HEMOSTASIS CONTROL: SHX6838

## 2019-09-12 LAB — BODY FLUID CELL COUNT WITH DIFFERENTIAL
Eos, Fluid: 1 %
Lymphs, Fluid: 4 %
Monocyte-Macrophage-Serous Fluid: 40 % — ABNORMAL LOW (ref 50–90)
Neutrophil Count, Fluid: 55 % — ABNORMAL HIGH (ref 0–25)
Total Nucleated Cell Count, Fluid: 175 cu mm (ref 0–1000)

## 2019-09-12 SURGERY — BRONCHOSCOPY, WITH FLUOROSCOPY
Anesthesia: General

## 2019-09-12 MED ORDER — FENTANYL CITRATE (PF) 100 MCG/2ML IJ SOLN
INTRAMUSCULAR | Status: DC | PRN
  Administered 2019-09-12: 50 ug via INTRAVENOUS

## 2019-09-12 MED ORDER — FENTANYL CITRATE (PF) 100 MCG/2ML IJ SOLN
INTRAMUSCULAR | Status: AC
  Filled 2019-09-12: qty 2

## 2019-09-12 MED ORDER — ALBUTEROL SULFATE HFA 108 (90 BASE) MCG/ACT IN AERS
INHALATION_SPRAY | RESPIRATORY_TRACT | Status: DC | PRN
  Administered 2019-09-12 (×2): 3 via RESPIRATORY_TRACT

## 2019-09-12 MED ORDER — LACTATED RINGERS IV SOLN: INTRAVENOUS | Status: DC

## 2019-09-12 MED ORDER — LIDOCAINE 2% (20 MG/ML) 5 ML SYRINGE
INTRAMUSCULAR | Status: DC | PRN
  Administered 2019-09-12: 60 mg via INTRAVENOUS

## 2019-09-12 MED ORDER — ONDANSETRON HCL 4 MG/2ML IJ SOLN
INTRAMUSCULAR | Status: DC | PRN
  Administered 2019-09-12: 4 mg via INTRAVENOUS

## 2019-09-12 MED ORDER — PHENYLEPHRINE HCL 0.25 % NA SOLN: 1.0000 | Freq: Four times a day (QID) | NASAL | Status: DC | PRN

## 2019-09-12 MED ORDER — SODIUM CHLORIDE (PF) 0.9 % IJ SOLN
PREFILLED_SYRINGE | INTRAMUSCULAR | Status: DC | PRN
  Administered 2019-09-12 (×2): 1 mL

## 2019-09-12 MED ORDER — EPINEPHRINE 1 MG/10ML IJ SOSY
PREFILLED_SYRINGE | INTRAMUSCULAR | Status: AC
  Filled 2019-09-12: qty 10

## 2019-09-12 MED ORDER — PROPOFOL 10 MG/ML IV BOLUS
INTRAVENOUS | Status: AC
  Filled 2019-09-12: qty 20

## 2019-09-12 MED ORDER — LIDOCAINE HCL URETHRAL/MUCOSAL 2 % EX GEL: 1.0000 "application " | Freq: Once | CUTANEOUS | Status: DC

## 2019-09-12 MED ORDER — SUCCINYLCHOLINE CHLORIDE 200 MG/10ML IV SOSY
PREFILLED_SYRINGE | INTRAVENOUS | Status: DC | PRN
  Administered 2019-09-12: 100 mg via INTRAVENOUS

## 2019-09-12 MED ORDER — ALBUTEROL SULFATE HFA 108 (90 BASE) MCG/ACT IN AERS
INHALATION_SPRAY | RESPIRATORY_TRACT | Status: AC
  Filled 2019-09-12: qty 6.7

## 2019-09-12 MED ORDER — PROPOFOL 10 MG/ML IV BOLUS
INTRAVENOUS | Status: DC | PRN
  Administered 2019-09-12: 150 mg via INTRAVENOUS
  Administered 2019-09-12 (×4): 20 mg via INTRAVENOUS

## 2019-09-12 MED ORDER — DEXAMETHASONE SODIUM PHOSPHATE 10 MG/ML IJ SOLN
INTRAMUSCULAR | Status: DC | PRN
  Administered 2019-09-12: 10 mg via INTRAVENOUS

## 2019-09-12 NOTE — Anesthesia Postprocedure Evaluation (Signed)
Anesthesia Post Note  Patient: Beverly Ballard  Procedure(s) Performed: VIDEO BRONCHOSCOPY WITH FLUORO (N/A ) BRONCHIAL BIOPSIES BRONCHIAL WASHINGS HEMOSTASIS CONTROL     Patient location during evaluation: PACU Anesthesia Type: General Level of consciousness: awake and alert, oriented and patient cooperative Pain management: pain level controlled Vital Signs Assessment: post-procedure vital signs reviewed and stable Respiratory status: spontaneous breathing, nonlabored ventilation and respiratory function stable Cardiovascular status: blood pressure returned to baseline and stable Postop Assessment: no apparent nausea or vomiting Anesthetic complications: no    Last Vitals:  Vitals:   09/12/19 0911 09/12/19 0920  BP: (!) 153/49 (!) 146/49  Pulse: 83 81  Resp: 17 20  Temp: 36.5 C   SpO2: 100% 94%    Last Pain:  Vitals:   09/12/19 0911  TempSrc: Oral  PainSc: 0-No pain                 Pervis Hocking

## 2019-09-12 NOTE — Op Note (Signed)
[facility] Cardiopulmonary Patient Name: Beverly Ballard Procedure Date: 09/12/2019 MRN: [id number] Attending MD: Laurin Coder MD, MD Date of Birth: August 07, 1945 CSN: [id number] Age: 74 Admit Type: Outpatient Ethnicity: Not Hispanic or Latino Procedure:             Bronchoscopy Indications:           Diffuse infiltrate Providers:             Georgi Tuel A. Ander Slade MD, MD, Josie Dixon, RN, Clyde Lundborg, RN, Theodora Blow, Technician Referring MD:           Medicines:              Complications:         No immediate complications Estimated Blood Loss:  Estimated blood loss was minimal. Procedure:      Pre-Anesthesia Assessment:      - The anesthesia plan was to use general anesthesia.      After obtaining informed consent, the bronchoscope was passed under       direct vision. Throughout the procedure, the patient's blood pressure,       pulse, and oxygen saturations were monitored continuously. the [id number]       ([id number]) Olympus therapeutic bronchoscope was introduced through the       mouth, via the endotracheal tube (the patient was intubated for the       procedure) and advanced to the tracheobronchial tree of both lungs. The       procedure was accomplished without difficulty. The patient tolerated the       procedure well. Findings:      Transbronchial biopsies of an area of infiltration were performed in the       right middle lobe using forceps and sent for histopathology examination.       The procedure was guided by fluoroscopy. Transbronchial biopsy technique       was selected because the sampling site was not visible endoscopically.       Six biopsy passes were performed. Six biopsy samples were obtained.      Bronchoalveolar lavage was performed in the right middle lobe of the       lung and sent for cell count and differential, routine cytology and       bacterial, AFB and fungal analysis. 120 mL of fluid were instilled. 50        mL were returned. The return was cloudy. There were no mucoid plugs in       the return fluid. Impression:      - Diffuse infiltrate      - No specimens collected. Moderate Sedation:      An independent trained observer was present and continuously monitored       the patient. Recommendation:      - Await BAL, biopsy and cytology results. Procedure Code(s):      --- Professional ---      [phone], Bronchoscopy, rigid or flexible, including fluoroscopic guidance,       when performed; diagnostic, with cell washing, when performed (separate       procedure) Diagnosis Code(s):      --- Professional ---      R91.8, Other nonspecific abnormal finding of lung field CPT copyright 2019 American Medical Association. All rights reserved. The codes documented in this report are preliminary and  upon coder review may  be revised to meet current compliance requirements. Sherrilyn Rist, MD Laurin Coder MD, MD 09/12/2019 9:09:02 AM This report has been signed electronically. Number of Addenda: 0 Scope In: Scope Out:

## 2019-09-12 NOTE — Telephone Encounter (Signed)
Spoke with Beverly Ballard about 10:00, driving [name] home at present  Procedure and findings discussed  Awaiting washings and biopsy results  We will be in touch with [name]

## 2019-09-12 NOTE — Anesthesia Preprocedure Evaluation (Addendum)
Anesthesia Evaluation  Patient identified by MRN, date of birth, ID band Patient awake    Reviewed: Allergy & Precautions, NPO status , Patient's Chart, lab work & pertinent test results  Airway Mallampati: III  TM Distance: >3 FB Neck ROM: Full    Dental no notable dental hx. (+) Teeth Intact, Dental Advisory Given   Pulmonary shortness of breath, former smoker,  Hypersensitivity pneumonitis  Quit smoking 1980, 5 pack year history Recently started using albuterol with SOB- sometimes up to 3x/d   Pulmonary exam normal breath sounds clear to auscultation       Cardiovascular hypertension, Pt. on medications Normal cardiovascular exam Rhythm:Regular Rate:Normal     Neuro/Psych Seizures -, Well Controlled,  PSYCHIATRIC DISORDERS Anxiety Depression Last seizure 1988    GI/Hepatic Neg liver ROS, GERD  Medicated and Controlled,  Endo/Other  Hypothyroidism Morbid obesityBMI 38  Renal/GU negative Renal ROS  negative genitourinary   Musculoskeletal  (+) Arthritis , Osteoarthritis,  chronic LBP gout   Abdominal (+) + obese,   Peds  Hematology negative hematology ROS (+)   Anesthesia Other Findings   Reproductive/Obstetrics negative OB ROS                           Anesthesia Physical Anesthesia Plan  ASA: III  Anesthesia Plan: General   Post-op Pain Management:    Induction: Intravenous  PONV Risk Score and Plan: 3 and Ondansetron, Dexamethasone and Treatment may vary due to age or medical condition  Airway Management Planned: Oral ETT  Additional Equipment: None  Intra-op Plan:   Post-operative Plan: Extubation in OR  Informed Consent: I have reviewed the patients History and Physical, chart, labs and discussed the procedure including the risks, benefits and alternatives for the proposed anesthesia with the patient or authorized representative who has indicated his/her understanding  and acceptance.     Dental advisory given  Plan Discussed with: CRNA  Anesthesia Plan Comments:         Anesthesia Quick Evaluation

## 2019-09-12 NOTE — Op Note (Signed)
Bronchoscopy Procedure Note  Date of Operation: 09/12/2019  Pre-op Diagnosis: Hypersensitivity pneumonitis  Post-op Diagnosis: Hypersensitivity pneumonitis  Surgeon: Laurin Coder  Assistants:   Anesthesia: General endotracheal anesthesia  Operation: Flexible fiberoptic bronchoscopy, diagnostic bronchoalveolar lavage and transbronchial biopsies  Findings: Normal airway  Specimen: BAL right middle lobe, transbronchial biopsies right middle lobe  Estimated Blood Loss: less than 50   Drains: None  Complications: None  Indications and History: The patient is a 74 y.o. female with cough, shortness of breath, concern for hypersensitivity pneumonitis.  The risks, benefits, complications, treatment options and expected outcomes were discussed with the patient.  The possibilities of reaction to medication, pulmonary aspiration, perforation of a viscus, bleeding, failure to diagnose a condition and creating a complication requiring transfusion or operation were discussed with the patient who freely signed the consent.    Description of Procedure: The patient was seen in the Holding Room and the site of surgery properly noted/marked.  The patient was taken to endoscopy suite, identified as Beverly Ballard and the procedure verified as Flexible Fiberoptic Bronchoscopy.  A Time Out was held and the above information confirmed.   After the induction of topical nasopharyngeal anesthesia, the patient was positioned supine and the bronchoscope was passed through the endotracheal tube.  The scope was then passed into the trachea. Careful inspection of the tracheal lumen was accomplished. The scope was sequentially passed into the left main and then left upper and lower bronchi and segmental bronchi.  No airway lesions noted  The scope was then withdrawn and advanced into the right main bronchus and then into the RUL, RML, and RLL bronchi and segmental bronchi.  Bronchoalveolar lavage was done and  there was 60 cc specimen lavage specimen was obtained.  Transbronchial biopsies right middle lobe performed-6 attempts with 6 retrieval soft tissue  Endobronchial findings: Normal airway Trachea: Normal mucosa Carina: Normal mucosa Right main bronchus: Normal mucosa Right upper lobe bronchus: Normal mucosa Right upper lobe bronchus: Normal mucosa Right upper lobe bronchus: Normal mucosa Left main bronchus: Normal mucosa Left upper lobe bronchus: Normal mucosa Left lower lobe bronchus: Normal mucosa  BAL right middle lobe Transbronchial biopsies right middle lobe  The Patient was taken to the Endoscopy Recovery area in satisfactory condition.  Attestation: I performed the procedure.  Alexiz Cothran A Beverly Ballard

## 2019-09-12 NOTE — Progress Notes (Signed)
CXR reviewed- no pneumothorax on CXR 

## 2019-09-12 NOTE — Interval H&P Note (Signed)
History and Physical Interval Note:  09/12/2019 8:01 AM  Beverly Ballard  has presented today for surgery, with the diagnosis of HYPERSENSITIVITY PNEUMONITIS.  The various methods of treatment have been discussed with the patient and family. After consideration of risks, benefits and other options for treatment, the patient has consented to  Procedure(s): VIDEO BRONCHOSCOPY WITH FLUORO (N/A) as a surgical intervention.  The patient's history has been reviewed, patient examined, no change in status, stable for surgery.  I have reviewed the patient's chart and labs.  Questions were answered to the patient's satisfaction.    Has no overnight events Feels generally well Still has some shortness of breath Physical exam is unremarkable today Discussed bronchoscopy Agreeable to go ahead with procedure  Beverly Ballard A Kyngston Pickelsimer

## 2019-09-12 NOTE — Addendum Note (Signed)
Addendum  created 09/12/19 0938 by Maxwell Caul, CRNA   Charge Capture section accepted, Flowsheet accepted, Intraprocedure Flowsheets edited

## 2019-09-12 NOTE — Transfer of Care (Signed)
Immediate Anesthesia Transfer of Care Note  Patient: Beverly Ballard  Procedure(s) Performed: VIDEO BRONCHOSCOPY WITH FLUORO (N/A ) BRONCHIAL BIOPSIES BRONCHIAL WASHINGS HEMOSTASIS CONTROL  Patient Location: PACU and Endoscopy Unit  Anesthesia Type:General  Level of Consciousness: awake, alert  and sedated  Airway & Oxygen Therapy: Patient Spontanous Breathing and Patient connected to face mask oxygen  Post-op Assessment: Report given to RN and Post -op Vital signs reviewed and stable  Post vital signs: Reviewed and stable  Last Vitals:  Vitals Value Taken Time  BP 181/49 09/12/19 0903  Temp    Pulse 88 09/12/19 0904  Resp 20 09/12/19 0908  SpO2 100 % 09/12/19 0904  Vitals shown include unvalidated device data.  Last Pain:  Vitals:   09/12/19 0902  TempSrc: Oral  PainSc: 0-No pain         Complications: No apparent anesthesia complications

## 2019-09-12 NOTE — Anesthesia Procedure Notes (Signed)
Procedure Name: Intubation Date/Time: 09/12/2019 8:08 AM Performed by: Maxwell Caul, CRNA Pre-anesthesia Checklist: Patient identified, Emergency Drugs available, Suction available and Patient being monitored Patient Re-evaluated:Patient Re-evaluated prior to induction Oxygen Delivery Method: Circle system utilized Preoxygenation: Pre-oxygenation with 100% oxygen Induction Type: IV induction Ventilation: Mask ventilation without difficulty Laryngoscope Size: Mac and 4 Grade View: Grade I Tube type: Oral Tube size: 8.5 mm Number of attempts: 1 Airway Equipment and Method: Stylet Placement Confirmation: ETT inserted through vocal cords under direct vision,  positive ETCO2 and breath sounds checked- equal and bilateral Secured at: 21 cm Tube secured with: Tape Dental Injury: Teeth and Oropharynx as per pre-operative assessment

## 2019-09-13 ENCOUNTER — Encounter: Payer: Self-pay | Admitting: *Deleted

## 2019-09-13 LAB — SURGICAL PATHOLOGY

## 2019-09-13 LAB — CYTOLOGY - NON PAP

## 2019-09-13 NOTE — Progress Notes (Signed)
Follow-up call made post procedure, pt with c/o bilateral arm pain, instructed to call provider or MD if pain continues or come to ER, verbal acknowledgement noted

## 2019-09-14 LAB — CULTURE, RESPIRATORY W GRAM STAIN
Culture: NO GROWTH
Gram Stain: NONE SEEN

## 2019-09-14 LAB — ACID FAST SMEAR (AFB, MYCOBACTERIA): Acid Fast Smear: NEGATIVE

## 2019-09-20 ENCOUNTER — Ambulatory Visit: Payer: Medicare HMO | Admitting: Rheumatology

## 2019-09-21 DIAGNOSIS — J3081 Allergic rhinitis due to animal (cat) (dog) hair and dander: Secondary | ICD-10-CM | POA: Diagnosis not present

## 2019-09-21 DIAGNOSIS — J3089 Other allergic rhinitis: Secondary | ICD-10-CM | POA: Diagnosis not present

## 2019-09-21 DIAGNOSIS — J301 Allergic rhinitis due to pollen: Secondary | ICD-10-CM | POA: Diagnosis not present

## 2019-09-26 DIAGNOSIS — J3089 Other allergic rhinitis: Secondary | ICD-10-CM | POA: Diagnosis not present

## 2019-09-26 DIAGNOSIS — J3081 Allergic rhinitis due to animal (cat) (dog) hair and dander: Secondary | ICD-10-CM | POA: Diagnosis not present

## 2019-09-26 DIAGNOSIS — J301 Allergic rhinitis due to pollen: Secondary | ICD-10-CM | POA: Diagnosis not present

## 2019-09-27 ENCOUNTER — Other Ambulatory Visit: Payer: Self-pay

## 2019-09-27 ENCOUNTER — Encounter: Payer: Self-pay | Admitting: Pulmonary Disease

## 2019-09-27 ENCOUNTER — Ambulatory Visit: Payer: Medicare HMO | Admitting: Pulmonary Disease

## 2019-09-27 VITALS — BP 126/76 | HR 86 | Temp 98.5°F | Ht 67.0 in | Wt 237.4 lb

## 2019-09-27 DIAGNOSIS — R0600 Dyspnea, unspecified: Secondary | ICD-10-CM

## 2019-09-27 DIAGNOSIS — G4733 Obstructive sleep apnea (adult) (pediatric): Secondary | ICD-10-CM | POA: Diagnosis not present

## 2019-09-27 DIAGNOSIS — R0609 Other forms of dyspnea: Secondary | ICD-10-CM

## 2019-09-27 MED ORDER — PREDNISONE 10 MG PO TABS
30.0000 mg | ORAL_TABLET | Freq: Every day | ORAL | 1 refills | Status: DC
Start: 2019-09-27 — End: 2019-10-25

## 2019-09-27 NOTE — Progress Notes (Signed)
Beverly Ballard    [id number]    [birth year]  Primary Care Physician:Koberlein, Steele Berg, MD  Referring Physician: Caren Macadam, [place],  [place] [id number]  Chief complaint:   Follow-up of obstructive sleep apnea and hypersensitivity pneumonitis Breathing is a little worse, shortness of breath on exertion  HPI:  She does have a history of chronic shortness of breath Chronic back pain She is a couple of back surgeries in the past Chronic knee pain Treated for pneumonia Recently had a bronchoscopy-increased neutrophils on BAL, transbronchial biopsies were mainly bronchial tissue  She is using CPAP on a regular basis having some issues with the mask, wants to try full facemask  Her sleep is nonstructured-goes to bed between 11 and 2 AM Wakes up between 7 and 9 AM Except about 2-3 times a night  Shortness of breath with activity Denies significant symptoms of on antidepressants  She did smoke in the past-claims never to be a heavy smoker She does have a history of allergies for which she receives allergy shots  Pets: No pets Occupation: Worked retail Exposures: No mold exposure Smoking history: Quit smoking many years ago Travel history: Visits to [place] frequently Relevant family history: No contributory family history of lung disease  Outpatient Encounter Medications as of 09/27/2019  Medication Sig  . albuterol (VENTOLIN HFA) 108 (90 Base) MCG/ACT inhaler Inhale 2 puffs into the lungs every 4 (four) hours as needed for wheezing or shortness of breath.  Marland Kitchen aspirin 81 MG tablet Take 81 mg by mouth daily.  Marland Kitchen b complex vitamins tablet Take 1 tablet by mouth daily.  . beta carotene w/minerals (OCUVITE) tablet Take 1 tablet by mouth daily.  . Cholecalciferol (VITAMIN D3 PO) Take by mouth.  . cyclobenzaprine (FLEXERIL) 10 MG tablet Take 1 tablet (10 mg total) by mouth 2 (two) times daily as needed for muscle spasms.  Marland Kitchen dicyclomine (BENTYL) 10  MG capsule Take 1 capsule (10 mg total) by mouth every 8 (eight) hours as needed for spasms.  Marland Kitchen esomeprazole (NEXIUM) 20 MG capsule Take 2 capsules (40 mg total) by mouth daily before breakfast. (Patient taking differently: Take 20 mg by mouth daily before breakfast. )  . fluticasone (FLONASE) 50 MCG/ACT nasal spray Place 2 sprays into both nostrils daily. (Patient taking differently: Place 2 sprays into both nostrils daily as needed for allergies. )  . levocetirizine (XYZAL) 5 MG tablet Take 5 mg by mouth daily.   Marland Kitchen levothyroxine (SYNTHROID) 88 MCG tablet Take 1 tablet (88 mcg total) by mouth daily.  Marland Kitchen lisinopril (ZESTRIL) 5 MG tablet TAKE 1 TABLET BY MOUTH EVERY DAY (Patient taking differently: Take 5 mg by mouth daily. )  . methocarbamol (ROBAXIN) 500 MG tablet Take 1 tablet (500 mg total) by mouth 4 (four) times daily. (Patient taking differently: Take 500 mg by mouth every 6 (six) hours as needed for muscle spasms. )  . ondansetron (ZOFRAN ODT) 4 MG disintegrating tablet Take 1 tablet (4 mg total) by mouth every 6 (six) hours as needed for nausea or vomiting.  . pravastatin (PRAVACHOL) 20 MG tablet TAKE 1 TABLET BY MOUTH EVERY DAY (Patient taking differently: Take 20 mg by mouth daily. TAKE 1 TABLET BY MOUTH EVERY DAY)  . sertraline (ZOLOFT) 100 MG tablet TAKE 1 TABLET (100 MG TOTAL) BY MOUTH DAILY. TAKE WITH 25MG=125MG  . sertraline (ZOLOFT) 25 MG tablet TAKE 1 TABLET (25 MG TOTAL) BY MOUTH DAILY.  TAKE WITH 100MG=125MG.  Marland Kitchen Spacer/Aero-Holding Dorise Bullion Use as directed with inhaler  . zonisamide (ZONEGRAN) 100 MG capsule Take 200 mg by mouth 2 (two) times daily.   No facility-administered encounter medications on file as of 09/27/2019.    Allergies as of 09/27/2019 - Review Complete 09/27/2019  Allergen Reaction Noted  . Lamotrigine Swelling 07/03/2015  . Bupropion Other (See Comments) 05/02/2016  . Carbamazepine Other (See Comments) 07/03/2015  . Levetiracetam  07/03/2015  . Tramadol  Other (See Comments) 05/02/2016  . Adhesive [tape]  09/08/2014  . Clarithromycin    . Doxycycline    . Nickel  08/11/2014  . Other  08/11/2014  . Estradiol Rash 07/03/2015  . Latex Rash 01/28/2018  . Phenytoin sodium extended Other (See Comments) 07/03/2015    Past Medical History:  Diagnosis Date  . Allergy   . Anxiety and depression 12/19/2009  . Arthritis   . Cancer (HCC)    vaginal  . Constipation   . Degenerative disorder of eye   . Diverticulosis   . Essential hypertension, benign 06/26/2009  . GERD 05/27/2007   takes Nexium daily  . Heart murmur    yrs ago  . History of gout   . History of migraine many yrs ago  . Hyperlipidemia    takes Pravastatin daily  . Hypothyroidism   . Insomnia    but doesn't take any meds  . LOW BACK PAIN 05/27/2007  . Numbness    left toes   . Osteoporosis   . Seizures ([facility])    last 1983, none since then- last seizure 1988 per pt     Past Surgical History:  Procedure Laterality Date  . ADENOIDECTOMY    . BACK SURGERY     lumbar x2  . BRONCHIAL BIOPSY  09/12/2019   Procedure: BRONCHIAL BIOPSIES;  Surgeon: Laurin Coder, MD;  Location: [facility];  Service: Endoscopy;;  . BRONCHIAL WASHINGS  09/12/2019   Procedure: BRONCHIAL WASHINGS;  Surgeon: Laurin Coder, MD;  Location: [facility];  Service: Endoscopy;;  . COLONOSCOPY  2009  . ESOPHAGOGASTRODUODENOSCOPY    . Heel spurs Bilateral   . HEMOSTASIS CONTROL  09/12/2019   Procedure: HEMOSTASIS CONTROL;  Surgeon: Laurin Coder, MD;  Location: [facility];  Service: Endoscopy;;  epi  . KNEE ARTHROPLASTY Left 11/07/2013   Procedure: COMPUTER ASSISTED TOTAL KNEE ARTHROPLASTY;  Surgeon: Marybelle Killings, MD;  Location: [facility];  Service: Orthopedics;  Laterality: Left;  Left Total Knee Arthroplasty, Cemented, Computer Assist  . TONSILLECTOMY    . TOTAL KNEE REVISION Left 09/19/2014   Procedure: LEFT TOTAL KNEE REVISION;  Surgeon: Leandrew Koyanagi, MD;  Location: [facility];  Service:  Orthopedics;  Laterality: Left;  . UPPER GASTROINTESTINAL ENDOSCOPY    . VIDEO BRONCHOSCOPY N/A 09/12/2019   Procedure: VIDEO BRONCHOSCOPY WITH FLUORO;  Surgeon: Laurin Coder, MD;  Location: [facility];  Service: Endoscopy;  Laterality: N/A;    Family History  Problem Relation Age of Onset  . Aortic dissection Father   . Arthritis Mother   . Stroke Mother 32  . Heart murmur Other   . Colon cancer Neg Hx   . Esophageal cancer Neg Hx   . Stomach cancer Neg Hx   . Rectal cancer Neg Hx   . Colon polyps Neg Hx     Social History   Socioeconomic History  . Marital status: Married    Spouse name: Not on file  . Number of children: 1  .  Years of education: Not on file  . Highest education level: Not on file  Occupational History  . Occupation: Retired  Tobacco Use  . Smoking status: Former Smoker    Packs/day: 0.25    Years: 20.00    Pack years: 5.00    Types: Cigarettes    Quit date: 09/08/1978    Years since quitting: 41.0  . Smokeless tobacco: Never Used  . Tobacco comment: quit smoking 18yr ago  Substance and Sexual Activity  . Alcohol use: No  . Drug use: No  . Sexual activity: Never    Birth control/protection: Post-menopausal  Other Topics Concern  . Not on file  Social History Narrative  . Not on file   Social Determinants of Health   Financial Resource Strain:   . Difficulty of Paying Living Expenses:   Food Insecurity:   . Worried About RCharity fundraiserin the Last Year:   . RArboriculturistin the Last Year:   Transportation Needs:   . LFilm/video editor(Medical):   .Marland KitchenLack of Transportation (Non-Medical):   Physical Activity:   . Days of Exercise per Week:   . Minutes of Exercise per Session:   Stress:   . Feeling of Stress :   Social Connections:   . Frequency of Communication with Friends and Family:   . Frequency of Social Gatherings with Friends and Family:   . Attends Religious Services:   . Active Member of Clubs or  Organizations:   . Attends CArchivistMeetings:   .Marland KitchenMarital Status:   Intimate Partner Violence:   . Fear of Current or Ex-Partner:   . Emotionally Abused:   .Marland KitchenPhysically Abused:   . Sexually Abused:     Review of Systems  Constitutional: Positive for fatigue.  HENT: Negative for postnasal drip and rhinorrhea.   Eyes: Negative.   Respiratory: Positive for shortness of breath. Negative for cough.   Cardiovascular: Negative.   Gastrointestinal: Negative.   Endocrine: Negative.   Genitourinary: Negative.   Musculoskeletal: Positive for arthralgias.       Knee pain  Allergic/Immunologic: Positive for environmental allergies.  Neurological: Negative.   Hematological: Negative.   Psychiatric/Behavioral: Positive for dysphoric mood and sleep disturbance.  All other systems reviewed and are negative.   Vitals:   09/27/19 1353  BP: 126/76  Pulse: 86  Temp: 98.5 F (36.9 C)  SpO2: 98%     Physical Exam  Constitutional: She appears well-developed and well-nourished. No distress.  HENT:  Head: Normocephalic and atraumatic.  Eyes: Pupils are equal, round, and reactive to light. Right eye exhibits no discharge. Left eye exhibits no discharge.  Neck: No tracheal deviation present. No thyromegaly present.  Cardiovascular: Normal rate and regular rhythm.  Pulmonary/Chest: Effort normal. No respiratory distress. She has no wheezes. She has rales.  Breath sounds coarse at the bases  Musculoskeletal:     Cervical back: Normal range of motion and neck supple.  Skin: She is not diaphoretic.  Psychiatric: Her behavior is normal. Thought content normal.   Compliance data reveals 87% compliance CPAP setting 5-20 95 percentile pressure of 12.1, maximum pressure of 14.8 AHI 8.9  Data Reviewed: Prominent interstitial markings on x-ray reviewed by myself-10/16/2017 Pulmonary function study-no obstruction, mild restriction CT scan reviewed Recent echocardiogram from 2019  reviewed  Assessment:  Chronic shortness of breath-multifactorial -Part of this may be deconditioning limitations from a chronic back pain  Chronic hypersensitivity pneumonitis -We will initiate steroids  at 30 mg daily -Follow-up closely in about a month -Importance of exercise and watching her diet discussed -Risk profile of steroids and side effects discussed  Significant back pain and knee pain  Previous smoker, significant exposure to secondhand smoke in the past-no underlying lung disease known -PFT does not reveal any significant obstruction  History of allergies for which she receives allergy shots  Plan/Recommendations:  Continue to use albuterol as needed  Continue CPAP use-we will try full facemask  Regular exercises encouraged  She can discontinue Breo Start prednisone 30 mg daily  Shortness of breath is multifactorial, part of this may be related to musculoskeletal pain and discomfort and also deconditioning  We will see her back in the office in 4 to 6 weeks  Obtain echocardiogram to assess dyspnea on exertion  Use of vitamin D and calcium discussed  Sherrilyn Rist MD [facility] 09/27/2019, 2:27 PM  CC: Caren Macadam, MD

## 2019-09-27 NOTE — Patient Instructions (Addendum)
We will start you on steroids 30 mg daily of prednisone  We will get an ultrasound of your heart to compare with previous  Regular exercises encouraged  Continue using CPAP on a regular basis, will get in touch with your medical supply company to change it to a full facemask  Call with significant concerns  Follow-up in 4 to 6 weeks  Vitamin D and calcium supplementation encouraged

## 2019-09-28 DIAGNOSIS — J3089 Other allergic rhinitis: Secondary | ICD-10-CM | POA: Diagnosis not present

## 2019-09-28 DIAGNOSIS — J301 Allergic rhinitis due to pollen: Secondary | ICD-10-CM | POA: Diagnosis not present

## 2019-09-28 DIAGNOSIS — J3081 Allergic rhinitis due to animal (cat) (dog) hair and dander: Secondary | ICD-10-CM | POA: Diagnosis not present

## 2019-09-30 DIAGNOSIS — G4733 Obstructive sleep apnea (adult) (pediatric): Secondary | ICD-10-CM | POA: Diagnosis not present

## 2019-10-05 DIAGNOSIS — J3081 Allergic rhinitis due to animal (cat) (dog) hair and dander: Secondary | ICD-10-CM | POA: Diagnosis not present

## 2019-10-05 DIAGNOSIS — J3089 Other allergic rhinitis: Secondary | ICD-10-CM | POA: Diagnosis not present

## 2019-10-05 DIAGNOSIS — J301 Allergic rhinitis due to pollen: Secondary | ICD-10-CM | POA: Diagnosis not present

## 2019-10-11 LAB — FUNGAL ORGANISM REFLEX

## 2019-10-11 LAB — FUNGUS CULTURE WITH STAIN

## 2019-10-11 LAB — FUNGUS CULTURE RESULT

## 2019-10-12 ENCOUNTER — Other Ambulatory Visit (INDEPENDENT_AMBULATORY_CARE_PROVIDER_SITE_OTHER): Payer: Medicare HMO

## 2019-10-12 ENCOUNTER — Other Ambulatory Visit: Payer: Self-pay

## 2019-10-12 ENCOUNTER — Other Ambulatory Visit: Payer: Self-pay | Admitting: Internal Medicine

## 2019-10-12 DIAGNOSIS — E890 Postprocedural hypothyroidism: Secondary | ICD-10-CM

## 2019-10-12 DIAGNOSIS — J3081 Allergic rhinitis due to animal (cat) (dog) hair and dander: Secondary | ICD-10-CM | POA: Diagnosis not present

## 2019-10-12 DIAGNOSIS — J301 Allergic rhinitis due to pollen: Secondary | ICD-10-CM | POA: Diagnosis not present

## 2019-10-12 DIAGNOSIS — J3089 Other allergic rhinitis: Secondary | ICD-10-CM | POA: Diagnosis not present

## 2019-10-12 LAB — T4, FREE: Free T4: 0.95 ng/dL (ref 0.60–1.60)

## 2019-10-12 LAB — TSH: TSH: 2.23 u[IU]/mL (ref 0.35–4.50)

## 2019-10-14 DIAGNOSIS — J3081 Allergic rhinitis due to animal (cat) (dog) hair and dander: Secondary | ICD-10-CM | POA: Diagnosis not present

## 2019-10-14 DIAGNOSIS — J3089 Other allergic rhinitis: Secondary | ICD-10-CM | POA: Diagnosis not present

## 2019-10-14 DIAGNOSIS — J301 Allergic rhinitis due to pollen: Secondary | ICD-10-CM | POA: Diagnosis not present

## 2019-10-18 ENCOUNTER — Ambulatory Visit (HOSPITAL_COMMUNITY): Payer: Medicare HMO | Attending: Cardiovascular Disease

## 2019-10-18 ENCOUNTER — Ambulatory Visit: Payer: Medicare HMO | Admitting: Rheumatology

## 2019-10-18 ENCOUNTER — Other Ambulatory Visit: Payer: Self-pay

## 2019-10-18 DIAGNOSIS — R0600 Dyspnea, unspecified: Secondary | ICD-10-CM | POA: Insufficient documentation

## 2019-10-18 DIAGNOSIS — R0609 Other forms of dyspnea: Secondary | ICD-10-CM

## 2019-10-19 DIAGNOSIS — J301 Allergic rhinitis due to pollen: Secondary | ICD-10-CM | POA: Diagnosis not present

## 2019-10-19 DIAGNOSIS — J3089 Other allergic rhinitis: Secondary | ICD-10-CM | POA: Diagnosis not present

## 2019-10-19 DIAGNOSIS — J3081 Allergic rhinitis due to animal (cat) (dog) hair and dander: Secondary | ICD-10-CM | POA: Diagnosis not present

## 2019-10-21 DIAGNOSIS — J3089 Other allergic rhinitis: Secondary | ICD-10-CM | POA: Diagnosis not present

## 2019-10-21 DIAGNOSIS — J3081 Allergic rhinitis due to animal (cat) (dog) hair and dander: Secondary | ICD-10-CM | POA: Diagnosis not present

## 2019-10-21 DIAGNOSIS — J301 Allergic rhinitis due to pollen: Secondary | ICD-10-CM | POA: Diagnosis not present

## 2019-10-25 ENCOUNTER — Other Ambulatory Visit: Payer: Self-pay | Admitting: Pulmonary Disease

## 2019-10-25 DIAGNOSIS — J3081 Allergic rhinitis due to animal (cat) (dog) hair and dander: Secondary | ICD-10-CM | POA: Diagnosis not present

## 2019-10-25 DIAGNOSIS — J301 Allergic rhinitis due to pollen: Secondary | ICD-10-CM | POA: Diagnosis not present

## 2019-10-25 DIAGNOSIS — J3089 Other allergic rhinitis: Secondary | ICD-10-CM | POA: Diagnosis not present

## 2019-10-25 MED ORDER — PREDNISONE 10 MG PO TABS: 10.0000 mg | ORAL_TABLET | Freq: Every day | ORAL | 1 refills | Status: DC

## 2019-10-25 MED ORDER — PREDNISONE 10 MG PO TABS
20.0000 mg | ORAL_TABLET | Freq: Every day | ORAL | 2 refills | Status: DC
Start: 2019-10-25 — End: 2019-10-25

## 2019-10-25 NOTE — Telephone Encounter (Signed)
Will refill prednisone 10 mg pills  We will go down to 20 mg a day You can try 10 mg twice a day to see what I does not cause as much anxiety Goal is to use 20 mg daily for 1 month and then cut it down to 10  Question to you is that is it helping some with your shortness of breath?

## 2019-10-25 NOTE — Progress Notes (Signed)
Prescription for prednisone 20 mg daily for 1 month and then go down to 10 mg daily

## 2019-10-26 LAB — ACID FAST CULTURE WITH REFLEXED SENSITIVITIES (MYCOBACTERIA): Acid Fast Culture: NEGATIVE

## 2019-10-26 NOTE — Telephone Encounter (Signed)
Let us continue on the prednisone as prescribed  Graded exercises as tolerated as we discussed  Call with concerns

## 2019-10-28 ENCOUNTER — Other Ambulatory Visit: Payer: Self-pay

## 2019-10-28 ENCOUNTER — Encounter: Payer: Self-pay | Admitting: Neurology

## 2019-10-28 ENCOUNTER — Other Ambulatory Visit: Payer: Self-pay | Admitting: Physician Assistant

## 2019-10-28 ENCOUNTER — Ambulatory Visit: Payer: Medicare HMO | Admitting: Neurology

## 2019-10-28 VITALS — BP 111/73 | HR 99 | Ht 67.0 in | Wt 242.6 lb

## 2019-10-28 DIAGNOSIS — G40009 Localization-related (focal) (partial) idiopathic epilepsy and epileptic syndromes with seizures of localized onset, not intractable, without status epilepticus: Secondary | ICD-10-CM

## 2019-10-28 MED ORDER — LACOSAMIDE 50 MG PO TABS: 50.0000 mg | ORAL_TABLET | Freq: Two times a day (BID) | ORAL | 0 refills | Status: DC

## 2019-10-28 MED ORDER — ZONISAMIDE 100 MG PO CAPS: 200.0000 mg | ORAL_CAPSULE | Freq: Two times a day (BID) | ORAL | 4 refills | Status: DC

## 2019-10-28 MED ORDER — VIMPAT 100 MG PO TABS: 100.0000 mg | ORAL_TABLET | Freq: Two times a day (BID) | ORAL | 0 refills | Status: DC

## 2019-10-28 NOTE — Progress Notes (Signed)
NEUROLOGY CONSULTATION NOTE  Beverly Ballard MRN: [id number] DOB: Oct 10, 1945  Referring provider: Dr. Micheline Rough Primary care provider: Dr. Micheline Rough   Reason for consult:  Establish care for seizures  Dear Dr Ethlyn Gallery:  Thank you for your kind referral of Beverly Ballard for consultation of the above symptoms. Although her history is well known to you, please allow me to reiterate it for the purpose of our medical record. She is alone in the office today. Records and images were personally reviewed where available.   HISTORY OF PRESENT ILLNESS: This is a 74 year old right-handed woman with a history of hypertension, hyperlipidemia, hypothyroidism, migraines, vaginal cancer, complex partial seizures, presenting for second opinion regarding seizure medication. Records from her neurologist Dr. Macario Carls were reviewed. Seizures started in childhood then stopped for a few years until she was in her teens. She thinks she had staring episodes, no further staring spells since 1988. She had a seizure in 2014 when she was completely weaned off seizure medication. She recalls she would smell a funny odor prior to a seizure. No nocturnal seizures. She has tried several seizure medications, she could not tolerate Levetiracetam, Lamotrigine. She had hyponatremia on carbamazepine. Aptiom was cost-prohibitive. She has been taking Zonisamide 200mg  BID since 2017. MRI brain with and without contrast in 2017 was unremarkable, there was a small Rathke's cleft cyst within the pituitary gland measuring 10mm.  She presents for second opinion regarding her seizure medication. She has been having a lot of health problems and has had a "test on every part of my body" with no clear cause found. She has been suffering from nausea for the past 5 years, no vomiting, occasional stomach pain. She is having a lot of drowsiness. She feels sick to her stomach right now and has an acidic taste. She lives with her husband  and denies any staring/unresponsive episodes, gaps in time, olfactory/gustatory hallucinations, myoclonic jerks. She has occasional left arm tingling. She has right-sided neck pain and back pain. No headaches, dizziness, vision changes, bowel/bladder dysfunction. Shd a fall 2-3 weeks ago when she fell backward. She is not sure if she passed out, mostly reporting she has not been feeling good for a long time. She had a pneumonia 3 years ago where she had inflammation in her lungs and was not feeling well, she had another bout this year, as well as dealing with depression. She has a CPAP machine which has helped her sleep a little better at night.  Epilepsy Risk Factors:  She had a normal birth and early development.  There is no history of febrile convulsions, CNS infections such as meningitis/encephalitis, significant traumatic brain injury, neurosurgical procedures, or family history of seizures.  Prior AEDs: Levetiracetam, Lamotrigine, carbamazepine  Diagnostic Data: MRI brain with and without contrast in 2017 was unremarkable, there was a small Rathke's cleft cyst within the pituitary gland measuring 32mm. No prior EEG on file.  Laboratory Data:  Lab Results  Component Value Date   WBC 8.7 09/09/2019   HGB 13.6 09/09/2019   HCT 41.3 09/09/2019   MCV 94.8 09/09/2019   PLT 194.0 09/09/2019     Chemistry      Component Value Date/Time   NA 137 11/18/2019 1505   NA 142 11/12/2015 0000   K 4.4 11/18/2019 1505   CL 102 11/18/2019 1505   CO2 27 11/18/2019 1505   BUN 14 11/18/2019 1505   BUN 8 11/12/2015 0000   CREATININE 1.14 11/18/2019 1505  CREATININE 0.77 10/05/2015 1105   GLU 95 11/12/2015 0000      Component Value Date/Time   CALCIUM 9.2 11/18/2019 1505   ALKPHOS 93 09/09/2019 1357   AST 16 09/09/2019 1357   ALT 11 09/09/2019 1357   BILITOT 0.4 09/09/2019 1357       PAST MEDICAL HISTORY: Past Medical History:  Diagnosis Date  . Allergy   . Anxiety and depression  12/19/2009  . Arthritis   . Cancer (HCC)    vaginal  . Constipation   . Degenerative disorder of eye   . Diverticulosis   . Essential hypertension, benign 06/26/2009  . GERD 05/27/2007   takes Nexium daily  . Heart murmur    yrs ago  . History of gout   . History of migraine many yrs ago  . Hyperlipidemia    takes Pravastatin daily  . Hypothyroidism   . Insomnia    but doesn't take any meds  . LOW BACK PAIN 05/27/2007  . Numbness    left toes   . Osteoporosis   . Seizures ([place])    last 1983, none since then- last seizure 1988 per pt     PAST SURGICAL HISTORY: Past Surgical History:  Procedure Laterality Date  . ADENOIDECTOMY    . BACK SURGERY     lumbar x2  . BRONCHIAL BIOPSY  09/12/2019   Procedure: BRONCHIAL BIOPSIES;  Surgeon: Laurin Coder, MD;  Location: [facility];  Service: Endoscopy;;  . BRONCHIAL WASHINGS  09/12/2019   Procedure: BRONCHIAL WASHINGS;  Surgeon: Laurin Coder, MD;  Location: [facility];  Service: Endoscopy;;  . COLONOSCOPY  2009  . ESOPHAGOGASTRODUODENOSCOPY    . Heel spurs Bilateral   . HEMOSTASIS CONTROL  09/12/2019   Procedure: HEMOSTASIS CONTROL;  Surgeon: Laurin Coder, MD;  Location: [facility];  Service: Endoscopy;;  epi  . KNEE ARTHROPLASTY Left 11/07/2013   Procedure: COMPUTER ASSISTED TOTAL KNEE ARTHROPLASTY;  Surgeon: Marybelle Killings, MD;  Location: [facility];  Service: Orthopedics;  Laterality: Left;  Left Total Knee Arthroplasty, Cemented, Computer Assist  . TONSILLECTOMY    . TOTAL KNEE REVISION Left 09/19/2014   Procedure: LEFT TOTAL KNEE REVISION;  Surgeon: Leandrew Koyanagi, MD;  Location: [facility];  Service: Orthopedics;  Laterality: Left;  . UPPER GASTROINTESTINAL ENDOSCOPY    . VIDEO BRONCHOSCOPY N/A 09/12/2019   Procedure: VIDEO BRONCHOSCOPY WITH FLUORO;  Surgeon: Laurin Coder, MD;  Location: [facility];  Service: Endoscopy;  Laterality: N/A;    MEDICATIONS: Current Outpatient Medications on File Prior to Visit    Medication Sig Dispense Refill  . albuterol (VENTOLIN HFA) 108 (90 Base) MCG/ACT inhaler Inhale 2 puffs into the lungs every 4 (four) hours as needed for wheezing or shortness of breath. 18 g 0  . aspirin 81 MG tablet Take 81 mg by mouth daily.    Marland Kitchen b complex vitamins tablet Take 1 tablet by mouth daily.    . beta carotene w/minerals (OCUVITE) tablet Take 1 tablet by mouth daily.    . Cholecalciferol (VITAMIN D3 PO) Take by mouth.    . cyclobenzaprine (FLEXERIL) 10 MG tablet Take 1 tablet (10 mg total) by mouth 2 (two) times daily as needed for muscle spasms. 30 tablet 0  . dicyclomine (BENTYL) 10 MG capsule Take 1 capsule (10 mg total) by mouth every 8 (eight) hours as needed for spasms. 30 capsule 3  . EPINEPHrine 0.3 mg/0.3 mL IJ SOAJ injection SMARTSIG:Injection As Directed    .  esomeprazole (NEXIUM) 20 MG capsule Take 2 capsules (40 mg total) by mouth daily before breakfast. (Patient taking differently: Take 20 mg by mouth daily before breakfast. ) 2 capsule 0  . fluticasone (FLONASE) 50 MCG/ACT nasal spray Place 2 sprays into both nostrils daily. (Patient taking differently: Place 2 sprays into both nostrils daily as needed for allergies. ) 16 g 6  . levocetirizine (XYZAL) 5 MG tablet Take 5 mg by mouth daily.     Marland Kitchen levothyroxine (SYNTHROID) 88 MCG tablet Take 1 tablet (88 mcg total) by mouth daily. 90 tablet 3  . lisinopril (ZESTRIL) 5 MG tablet TAKE 1 TABLET BY MOUTH EVERY DAY (Patient taking differently: Take 5 mg by mouth daily. ) 90 tablet 1  . ondansetron (ZOFRAN ODT) 4 MG disintegrating tablet Take 1 tablet (4 mg total) by mouth every 6 (six) hours as needed for nausea or vomiting. 30 tablet 3  . pravastatin (PRAVACHOL) 20 MG tablet TAKE 1 TABLET BY MOUTH EVERY DAY (Patient taking differently: Take 20 mg by mouth daily. TAKE 1 TABLET BY MOUTH EVERY DAY) 90 tablet 1  . predniSONE (DELTASONE) 10 MG tablet Take 1 tablet (10 mg total) by mouth daily with breakfast. 30 tablet 1  .  sertraline (ZOLOFT) 100 MG tablet TAKE 1 TABLET (100 MG TOTAL) BY MOUTH DAILY. TAKE WITH 25MG =125MG  90 tablet 1  . sertraline (ZOLOFT) 25 MG tablet TAKE 1 TABLET (25 MG TOTAL) BY MOUTH DAILY. TAKE WITH 100MG =125MG . 90 tablet 1  . Spacer/Aero-Holding Dorise Bullion Use as directed with inhaler 1 each 0  . zonisamide (ZONEGRAN) 100 MG capsule Take 200 mg by mouth 2 (two) times daily.     No current facility-administered medications on file prior to visit.    ALLERGIES: Allergies  Allergen Reactions  . Lamotrigine Swelling    Tongue swelling  . Bupropion Other (See Comments)    not known  . Carbamazepine Other (See Comments)    Hypnatremia  . Levetiracetam     Other reaction(s): Dizziness (intolerance)  . Tramadol Other (See Comments)    not known  . Adhesive [Tape]     ?  Blister after knee surgery  . Clarithromycin     Unsure of reaction    . Doxycycline     Interfered with seizure medication  . Nickel     Had to remove knee implant with nickel and replace it  . Other     Metal and perfumes  . Estradiol Rash  . Latex Rash  . Phenytoin Sodium Extended Other (See Comments)    drowsiness    FAMILY HISTORY: Family History  Problem Relation Age of Onset  . Aortic dissection Father   . Arthritis Mother   . Stroke Mother 63  . Heart murmur Other   . Colon cancer Neg Hx   . Esophageal cancer Neg Hx   . Stomach cancer Neg Hx   . Rectal cancer Neg Hx   . Colon polyps Neg Hx     SOCIAL HISTORY: Social History   Socioeconomic History  . Marital status: Married    Spouse name: Not on file  . Number of children: 1  . Years of education: Not on file  . Highest education level: Not on file  Occupational History  . Occupation: Retired  Tobacco Use  . Smoking status: Former Smoker    Packs/day: 0.25    Years: 20.00    Pack years: 5.00    Types: Cigarettes    Quit date: 09/08/1978  Years since quitting: 41.1  . Smokeless tobacco: Never Used  . Tobacco comment:  quit smoking 20yrs ago  Vaping Use  . Vaping Use: Never used  Substance and Sexual Activity  . Alcohol use: No  . Drug use: No  . Sexual activity: Never    Birth control/protection: Post-menopausal  Other Topics Concern  . Not on file  Social History Narrative   Right handed    Lives with husband    Social Determinants of Health   Financial Resource Strain:   . Difficulty of Paying Living Expenses:   Food Insecurity:   . Worried About Charity fundraiser in the Last Year:   . Arboriculturist in the Last Year:   Transportation Needs:   . Film/video editor (Medical):   Marland Kitchen Lack of Transportation (Non-Medical):   Physical Activity:   . Days of Exercise per Week:   . Minutes of Exercise per Session:   Stress:   . Feeling of Stress :   Social Connections:   . Frequency of Communication with Friends and Family:   . Frequency of Social Gatherings with Friends and Family:   . Attends Religious Services:   . Active Member of Clubs or Organizations:   . Attends Archivist Meetings:   Marland Kitchen Marital Status:   Intimate Partner Violence:   . Fear of Current or Ex-Partner:   . Emotionally Abused:   Marland Kitchen Physically Abused:   . Sexually Abused:      PHYSICAL EXAM: Vitals:   10/28/19 1026  BP: 111/73  Pulse: 99  SpO2: 95%   General: No acute distress Head:  Normocephalic/atraumatic Skin/Extremities: No rash, no edema Neurological Exam: Mental status: alert and oriented to person, place, and time, no dysarthria or aphasia, Fund of knowledge is appropriate.  Recent and remote memory are intact.  Attention and concentration are normal.  Cranial nerves: CN I: not tested CN II: pupils equal, round and reactive to light, visual fields intact CN III, IV, VI:  full range of motion, no nystagmus, no ptosis CN V: facial sensation intact CN VII: upper and lower face symmetric CN VIII: hearing intact to conversation Bulk & Tone: normal, no fasciculations. Motor: 5/5 throughout  with no pronator drift. Sensation: intact to light touch, cold, pin. Decreased vibration to knees. Romberg test negative Deep Tendon Reflexes: +1 throughout Cerebellar: no incoordination on finger to nose Gait: narrow-based and steady, no ataxia Tremor: none  IMPRESSION: This is a 74 year old right-handed woman with a history of hypertension, hyperlipidemia, hypothyroidism, migraines, vaginal cancer, complex partial seizures, presenting for second opinion regarding seizure medication. She has been seizure-free since 2014 (medication was weaned off at that time since she had been seizure-free for many years). She is on Zonisamide 200mg  BID and wonders if this is the reason she is feeling unwell with constant nausea. A 1-hour EEG will be ordered to classify seizures. We discussed starting Vimpat, side effects discussed. Once therapeutic, we will start weaning off Zonisamide and see if she feels better off ZNS. She is aware of  driving laws to stop driving after a seizure, until 6 months seizure-free. Follow-up in 3-4 months, she knows to call for any changes.   Thank you for allowing me to participate in the care of this patient. Please do not hesitate to call for any questions or concerns.   Ellouise Newer, M.D.  CC: Dr. Ethlyn Gallery

## 2019-10-28 NOTE — Patient Instructions (Signed)
1. Start the Vimpat samples: Take Vimpat 50mg : take 1/2 tablet twice a day for 1 week, then 50mg : take 1 tablet twice a day for 1 week, then take 100mg : Take 1 tablet twice a day. Call our office once close to finishing the samples  2. Continue Zonisamide 100mg : take 2 caps twice a day  3. Schedule 1-hour EEG  4. Follow-up in 3-4 months, call for any changes.  Seizure Precautions: 1. If medication has been prescribed for you to prevent seizures, take it exactly as directed.  Do not stop taking the medicine without talking to your doctor first, even if you have not had a seizure in a long time.   2. Avoid activities in which a seizure would cause danger to yourself or to others.  Don't operate dangerous machinery, swim alone, or climb in high or dangerous places, such as on ladders, roofs, or girders.  Do not drive unless your doctor says you may.  3. If you have any warning that you may have a seizure, lay down in a safe place where you can't hurt yourself.    4.  No driving for 6 months from last seizure, as per [facility].   Please refer to the following link on the [employer] website for more information: http://www.epilepsyfoundation.org/answerplace/Social/driving/drivingu.cfm   5.  Maintain good sleep hygiene. Avoid alcohol.  6.  Contact your doctor if you have any problems that may be related to the medicine you are taking.  7.  Call 911 and bring the patient back to the ED if:        A.  The seizure lasts longer than 5 minutes.       B.  The patient doesn't awaken shortly after the seizure  C.  The patient has new problems such as difficulty seeing, speaking or moving  D.  The patient was injured during the seizure  E.  The patient has a temperature over 102 F (39C)  F.  The patient vomited and now is having trouble breathing

## 2019-10-30 DIAGNOSIS — G4733 Obstructive sleep apnea (adult) (pediatric): Secondary | ICD-10-CM | POA: Diagnosis not present

## 2019-10-31 ENCOUNTER — Encounter: Payer: Self-pay | Admitting: Family Medicine

## 2019-10-31 ENCOUNTER — Telehealth: Payer: Self-pay | Admitting: Pulmonary Disease

## 2019-10-31 ENCOUNTER — Ambulatory Visit (INDEPENDENT_AMBULATORY_CARE_PROVIDER_SITE_OTHER): Payer: Medicare HMO | Admitting: Family Medicine

## 2019-10-31 ENCOUNTER — Other Ambulatory Visit: Payer: Self-pay

## 2019-10-31 ENCOUNTER — Other Ambulatory Visit: Payer: Self-pay | Admitting: Family Medicine

## 2019-10-31 VITALS — BP 126/58 | HR 96 | Temp 98.8°F | Wt 243.2 lb

## 2019-10-31 DIAGNOSIS — J479 Bronchiectasis, uncomplicated: Secondary | ICD-10-CM | POA: Diagnosis not present

## 2019-10-31 DIAGNOSIS — M25471 Effusion, right ankle: Secondary | ICD-10-CM | POA: Diagnosis not present

## 2019-10-31 DIAGNOSIS — R0602 Shortness of breath: Secondary | ICD-10-CM | POA: Diagnosis not present

## 2019-10-31 DIAGNOSIS — J301 Allergic rhinitis due to pollen: Secondary | ICD-10-CM | POA: Diagnosis not present

## 2019-10-31 DIAGNOSIS — I10 Essential (primary) hypertension: Secondary | ICD-10-CM

## 2019-10-31 DIAGNOSIS — E890 Postprocedural hypothyroidism: Secondary | ICD-10-CM | POA: Diagnosis not present

## 2019-10-31 DIAGNOSIS — J3089 Other allergic rhinitis: Secondary | ICD-10-CM | POA: Diagnosis not present

## 2019-10-31 DIAGNOSIS — M25472 Effusion, left ankle: Secondary | ICD-10-CM

## 2019-10-31 DIAGNOSIS — J3081 Allergic rhinitis due to animal (cat) (dog) hair and dander: Secondary | ICD-10-CM | POA: Diagnosis not present

## 2019-10-31 MED ORDER — FUROSEMIDE 20 MG PO TABS: 20.0000 mg | ORAL_TABLET | Freq: Every morning | ORAL | 0 refills | Status: DC

## 2019-10-31 NOTE — Telephone Encounter (Signed)
Called and spoke with Patient. Patient stated she is having swelling in her feet and legs.  Patient stated she has been taking Prednisone for past month and is currently taking 2 tabs daily.   Patient stated she has OV with PCP today at 1130.  Advised Patient to see PCP as scheduled, so they can see her feet and legs, and advise as needed. Understanding stated.  Nothing further at this time.

## 2019-10-31 NOTE — Progress Notes (Signed)
   Subjective:    Patient ID: Beverly Ballard, female    DOB: [birth year], 74 y.o.   MRN: [id number]  HPI Here for 4 days of swelling in both feet and ankles. This is mildly painful. She has never had this before. Of note she was hospitalized in May with a pneumothorax, but no explanation was ever found. All tests, including infection cultures and a bronchial biopsy, were unremarkable. She was put on a taper of Prednisone, starting at a fairly high dose. She is now taking 20 mg a day and is due to finish the taper in a week or so. She has mild SOB at baseline, but she thinks the Prednisone has helped a little. No chest pain or palpitations. On 09-09-19 her creatinine was normal at 0.82. On 10-12-19 her TSH and free T4 were normal. On 10-18-19 her ECHO showed normal systolic function with an EF of 60-65% and grade I diastolic dysfunction.    Review of Systems  Constitutional: Negative.   Respiratory: Positive for shortness of breath. Negative for cough and wheezing.   Cardiovascular: Positive for leg swelling. Negative for chest pain and palpitations.  Gastrointestinal: Negative.   Genitourinary: Negative.        Objective:   Physical Exam Constitutional:      Appearance: She is obese. She is not ill-appearing.  Cardiovascular:     Rate and Rhythm: Normal rate and regular rhythm.     Pulses: Normal pulses.     Heart sounds: Normal heart sounds.  Pulmonary:     Effort: Pulmonary effort is normal.     Breath sounds: Normal breath sounds.  Musculoskeletal:     Comments: 2+ edema in both feet and ankles   Neurological:     General: No focal deficit present.     Mental Status: She is alert and oriented to person, place, and time.           Assessment & Plan:  She has developed some mild ankle edema, and I suspect this is an effect of the Prednisone she has been taking. I advised her that this is temporary and the swelling should resolve over the next few weeks as she tapers off  Prednisone. For comfort she can try Lasix 20 mg each morning for 14 days. Follow up as scheduled.  Alysia Penna, MD

## 2019-10-31 NOTE — Telephone Encounter (Signed)
Has apt 07/28, not sure I saw a apt in epic unless I overlooked it

## 2019-11-01 DIAGNOSIS — G4733 Obstructive sleep apnea (adult) (pediatric): Secondary | ICD-10-CM | POA: Diagnosis not present

## 2019-11-02 ENCOUNTER — Other Ambulatory Visit: Payer: Self-pay

## 2019-11-02 ENCOUNTER — Ambulatory Visit: Payer: Medicare HMO | Admitting: Neurology

## 2019-11-02 ENCOUNTER — Ambulatory Visit: Payer: Medicare HMO | Admitting: Physician Assistant

## 2019-11-02 DIAGNOSIS — G40009 Localization-related (focal) (partial) idiopathic epilepsy and epileptic syndromes with seizures of localized onset, not intractable, without status epilepticus: Secondary | ICD-10-CM

## 2019-11-07 ENCOUNTER — Telehealth: Payer: Self-pay | Admitting: Neurology

## 2019-11-07 ENCOUNTER — Ambulatory Visit: Payer: Medicare HMO | Admitting: Family Medicine

## 2019-11-07 NOTE — Procedures (Signed)
ELECTROENCEPHALOGRAM REPORT  Date of Study: 11/02/2019  Patient's Name: Beverly Ballard MRN: [id number] Date of Birth: 1946/03/19  Referring Provider: Dr. Ellouise Newer  Clinical History: This is a 74 year old woman with seizures since childhood, recent systemic symptoms patient attributes to Zonisamide. EEG for seizure classification.  Medications: ZONEGRAN 100 MG capsule VENTOLIN HFA 108 (90 Base) MCG/ACT inhaler aspirin 81 MG tablet b complex vitamins tablet OCUVITE tablet VITAMIN D3 PO FLEXERIL 10 MG tablet BENTYL10 MG capsule EPINEPHrine 0.3 mg/0.3 mL IJ SOAJ injection   NEXIUM 20 MG capsule FLONASE 50 MCG/ACT nasal spray XYZAL 5 MG tablet SYNTHROID 88 MCG tablet ZESTRIL 5 MG tablet ZOFRAN ODT 4 MG disintegrating tablet PRAVACHOL 20 MG tablet DELTASONE 10 MG tablet ZOLOFT 100 MG tablet ZOLOFT 25 MG tablet  Technical Summary: A multichannel digital 1-hour EEG recording measured by the international 10-20 system with electrodes applied with paste and impedances below 5000 ohms performed in our laboratory with EKG monitoring in an awake and asleep patient.  Hyperventilation was not performed. Photic stimulation was performed.  The digital EEG was referentially recorded, reformatted, and digitally filtered in a variety of bipolar and referential montages for optimal display.    Description: The patient is awake and asleep during the recording.  During maximal wakefulness, there is a symmetric, medium voltage 9-10 Hz posterior dominant rhythm that attenuates with eye opening.  The record is symmetric.  During drowsiness and sleep, there is an increase in theta slowing of the background.  Vertex waves and symmetric sleep spindles were seen.  Photic stimulation did not elicit any abnormalities. There were frequent bursts of generalized high voltage irregular 3-4 Hz spike and polyspike and wave discharges with frontal predominance lasting 0.5-2 seconds seen.  There were no electrographic  seizures seen.    EKG lead was unremarkable.  Impression: This 1-hour awake and asleep EEG is abnormal due to frequent bursts of generalized high voltage 3-5 Hz spike and polyspike and wave discharges.  Clinical Correlation of the above findings is consistent with a diagnosis of primary generalized epilepsy. If further clinical questions remain, prolonged EEG may be helpful.  Clinical correlation is advised.   Ellouise Newer, M.D.

## 2019-11-07 NOTE — Telephone Encounter (Signed)
Discussed EEG showing primary generalized epilepsy and the need for seizure medication. Spoke to patient about symptoms, she has had even worse nausea, tongue swelling with initiation of Vimpat, even on low dose. She increased to 1 tab BID. Instructed to stop the Vimpat. Discussed instead to reduce Zonisamide to 1 in AM, 2 in PM and see if this helps with the nausea/stomach upset she had been having previously. In the past, she had the breakthrough seizure when she was completely stopped of medication, she will call our office for any change in symptoms. Consider low dose Depakote in the future.

## 2019-11-10 DIAGNOSIS — J3081 Allergic rhinitis due to animal (cat) (dog) hair and dander: Secondary | ICD-10-CM | POA: Diagnosis not present

## 2019-11-10 DIAGNOSIS — J3089 Other allergic rhinitis: Secondary | ICD-10-CM | POA: Diagnosis not present

## 2019-11-10 DIAGNOSIS — J301 Allergic rhinitis due to pollen: Secondary | ICD-10-CM | POA: Diagnosis not present

## 2019-11-11 ENCOUNTER — Other Ambulatory Visit: Payer: Self-pay | Admitting: Physician Assistant

## 2019-11-14 ENCOUNTER — Other Ambulatory Visit: Payer: Self-pay

## 2019-11-14 ENCOUNTER — Ambulatory Visit: Payer: Medicare HMO | Admitting: Pulmonary Disease

## 2019-11-14 ENCOUNTER — Encounter: Payer: Self-pay | Admitting: Pulmonary Disease

## 2019-11-14 VITALS — BP 126/54 | HR 88 | Temp 98.4°F | Ht 65.0 in | Wt 244.0 lb

## 2019-11-14 DIAGNOSIS — J479 Bronchiectasis, uncomplicated: Secondary | ICD-10-CM

## 2019-11-14 DIAGNOSIS — E871 Hypo-osmolality and hyponatremia: Secondary | ICD-10-CM | POA: Diagnosis not present

## 2019-11-14 MED ORDER — PREDNISONE 5 MG PO TABS: 5.0000 mg | ORAL_TABLET | Freq: Every day | ORAL | 1 refills | Status: DC

## 2019-11-14 MED ORDER — SULFAMETHOXAZOLE-TRIMETHOPRIM 400-80 MG PO TABS
1.0000 | ORAL_TABLET | ORAL | 0 refills | Status: DC
Start: 2019-11-14 — End: 2020-01-17

## 2019-11-14 MED ORDER — FUROSEMIDE 20 MG PO TABS: 20.0000 mg | ORAL_TABLET | Freq: Every morning | ORAL | 1 refills | Status: DC

## 2019-11-14 NOTE — Patient Instructions (Signed)
Hypersensitivity pneumonitis  Continue with prednisone Dose change her prednisone to 10 mg daily on August 1 Decrease to 5 mg daily September 1  We will prescribe Bactrim-prophylaxis for PCP infection in her lungs and people were using chronic steroids  We will add Lasix for your leg swelling 40 mg daily for 3 days Then 20 mg daily  Continue graded exercises  Obstructive sleep apnea -Continue with CPAP therapy  Your recent echocardiogram is reassuring, some abnormal relaxation-management is to optimize your risk profile -Exercise, weight loss  I will see you in 4 to 6 weeks  Repeat CT scan of the chest end of August-before next visit [facility] in next few days to check potassium and kidney functions

## 2019-11-14 NOTE — Progress Notes (Signed)
CHERESE Ballard    [id number]    1945-05-15  Primary Care Physician:Koberlein, Steele Berg, MD  Referring Physician: Caren Macadam, [place],  [place] [id number]  Chief complaint:   Follow-up of obstructive sleep apnea and hypersensitivity pneumonitis Breathing is a little worse, shortness of breath on exertion  HPI: Diagnosed hypersensitive pneumonitis Has been on prednisone  Complains of some leg swelling  She was recently prescribed Lasix for 20 mg daily for about 14 days Breathing feels about the same  Currently down to 20 mg prednisone daily She is going to cut down to 10 mg in 4 to 5 days  She does have a history of chronic shortness of breath Chronic back pain She is a couple of back surgeries in the past Chronic knee pain Treated for pneumonia  Recently had a bronchoscopy-increased neutrophils on BAL, transbronchial biopsies were mainly bronchial tissue  She is using CPAP on a regular basis -She is tolerating CPAP much better  Shortness of breath with activity Denies significant symptoms of on antidepressants Leg swelling is concerning  She did smoke in the past-claims never to be a heavy smoker She does have a history of allergies for which she receives allergy shots  Pets: No pets Occupation: Worked retail Exposures: No mold exposure Smoking history: Quit smoking many years ago Travel history: Visits to [place] frequently Relevant family history: No contributory family history of lung disease  Outpatient Encounter Medications as of 11/14/2019  Medication Sig  . albuterol (VENTOLIN HFA) 108 (90 Base) MCG/ACT inhaler Inhale 2 puffs into the lungs every 4 (four) hours as needed for wheezing or shortness of breath.  Marland Kitchen aspirin 81 MG tablet Take 81 mg by mouth daily.  Marland Kitchen b complex vitamins tablet Take 1 tablet by mouth daily.  . beta carotene w/minerals (OCUVITE) tablet Take 1 tablet by mouth daily.  . Cholecalciferol (VITAMIN D3  PO) Take by mouth.  . cyclobenzaprine (FLEXERIL) 10 MG tablet Take 1 tablet (10 mg total) by mouth 2 (two) times daily as needed for muscle spasms.  Marland Kitchen dicyclomine (BENTYL) 10 MG capsule Take 1 capsule (10 mg total) by mouth every 8 (eight) hours as needed for spasms.  Marland Kitchen EPINEPHrine 0.3 mg/0.3 mL IJ SOAJ injection SMARTSIG:Injection As Directed  . esomeprazole (NEXIUM) 20 MG capsule Take 2 capsules (40 mg total) by mouth daily before breakfast. (Patient taking differently: Take 20 mg by mouth daily before breakfast. )  . fluticasone (FLONASE) 50 MCG/ACT nasal spray Place 2 sprays into both nostrils daily. (Patient taking differently: Place 2 sprays into both nostrils daily as needed for allergies. )  . furosemide (LASIX) 20 MG tablet Take 1 tablet (20 mg total) by mouth in the morning.  Marland Kitchen levocetirizine (XYZAL) 5 MG tablet Take 5 mg by mouth daily.   Marland Kitchen levothyroxine (SYNTHROID) 88 MCG tablet Take 1 tablet (88 mcg total) by mouth daily.  Marland Kitchen lisinopril (ZESTRIL) 5 MG tablet Take 1 tablet (5 mg total) by mouth daily.  . ondansetron (ZOFRAN ODT) 4 MG disintegrating tablet Take 1 tablet (4 mg total) by mouth every 6 (six) hours as needed for nausea or vomiting.  . pravastatin (PRAVACHOL) 20 MG tablet Take 1 tablet (20 mg total) by mouth daily. TAKE 1 TABLET BY MOUTH EVERY DAY  . predniSONE (DELTASONE) 10 MG tablet Take 1 tablet (10 mg total) by mouth daily with breakfast.  . sertraline (ZOLOFT) 100 MG tablet TAKE 1 TABLET (  100 MG TOTAL) BY MOUTH DAILY. TAKE WITH 25MG=125MG  . sertraline (ZOLOFT) 25 MG tablet TAKE 1 TABLET (25 MG TOTAL) BY MOUTH DAILY. TAKE WITH 100MG=125MG.  Marland Kitchen Spacer/Aero-Holding Dorise Bullion Use as directed with inhaler  . zonisamide (ZONEGRAN) 100 MG capsule Take 2 capsules (200 mg total) by mouth 2 (two) times daily. (Patient taking differently: Take 200 mg by mouth in the morning, at noon, and at bedtime. )  . [DISCONTINUED] Lacosamide (VIMPAT) 100 MG TABS Take 1 tablet (100 mg total)  by mouth 2 (two) times daily.  . [DISCONTINUED] lacosamide (VIMPAT) 50 MG TABS tablet Take 1 tablet (50 mg total) by mouth 2 (two) times daily.   No facility-administered encounter medications on file as of 11/14/2019.    Allergies as of 11/14/2019 - Review Complete 11/14/2019  Allergen Reaction Noted  . Lamotrigine Swelling 07/03/2015  . Bupropion Other (See Comments) 05/02/2016  . Carbamazepine Other (See Comments) 07/03/2015  . Levetiracetam  07/03/2015  . Tramadol Other (See Comments) 05/02/2016  . Adhesive [tape]  09/08/2014  . Clarithromycin    . Doxycycline    . Nickel  08/11/2014  . Other  08/11/2014  . Estradiol Rash 07/03/2015  . Latex Rash 01/28/2018  . Phenytoin sodium extended Other (See Comments) 07/03/2015    Past Medical History:  Diagnosis Date  . Allergy   . Anxiety and depression 12/19/2009  . Arthritis   . Cancer (HCC)    vaginal  . Constipation   . Degenerative disorder of eye   . Diverticulosis   . Essential hypertension, benign 06/26/2009  . GERD 05/27/2007   takes Nexium daily  . Heart murmur    yrs ago  . History of gout   . History of migraine many yrs ago  . Hyperlipidemia    takes Pravastatin daily  . Hypothyroidism   . Insomnia    but doesn't take any meds  . LOW BACK PAIN 05/27/2007  . Numbness    left toes   . Osteoporosis   . Seizures ([place])    last 1983, none since then- last seizure 1988 per pt     Past Surgical History:  Procedure Laterality Date  . ADENOIDECTOMY    . BACK SURGERY     lumbar x2  . BRONCHIAL BIOPSY  09/12/2019   Procedure: BRONCHIAL BIOPSIES;  Surgeon: Laurin Coder, MD;  Location: [facility];  Service: Endoscopy;;  . BRONCHIAL WASHINGS  09/12/2019   Procedure: BRONCHIAL WASHINGS;  Surgeon: Laurin Coder, MD;  Location: [facility];  Service: Endoscopy;;  . COLONOSCOPY  2009  . ESOPHAGOGASTRODUODENOSCOPY    . Heel spurs Bilateral   . HEMOSTASIS CONTROL  09/12/2019   Procedure: HEMOSTASIS CONTROL;   Surgeon: Laurin Coder, MD;  Location: [facility];  Service: Endoscopy;;  epi  . KNEE ARTHROPLASTY Left 11/07/2013   Procedure: COMPUTER ASSISTED TOTAL KNEE ARTHROPLASTY;  Surgeon: Marybelle Killings, MD;  Location: [facility];  Service: Orthopedics;  Laterality: Left;  Left Total Knee Arthroplasty, Cemented, Computer Assist  . TONSILLECTOMY    . TOTAL KNEE REVISION Left 09/19/2014   Procedure: LEFT TOTAL KNEE REVISION;  Surgeon: Leandrew Koyanagi, MD;  Location: [facility];  Service: Orthopedics;  Laterality: Left;  . UPPER GASTROINTESTINAL ENDOSCOPY    . VIDEO BRONCHOSCOPY N/A 09/12/2019   Procedure: VIDEO BRONCHOSCOPY WITH FLUORO;  Surgeon: Laurin Coder, MD;  Location: [facility];  Service: Endoscopy;  Laterality: N/A;    Family History  Problem Relation Age of Onset  .  Aortic dissection Father   . Arthritis Mother   . Stroke Mother 50  . Heart murmur Other   . Colon cancer Neg Hx   . Esophageal cancer Neg Hx   . Stomach cancer Neg Hx   . Rectal cancer Neg Hx   . Colon polyps Neg Hx     Social History   Socioeconomic History  . Marital status: Married    Spouse name: Not on file  . Number of children: 1  . Years of education: Not on file  . Highest education level: Not on file  Occupational History  . Occupation: Retired  Tobacco Use  . Smoking status: Former Smoker    Packs/day: 0.25    Years: 20.00    Pack years: 5.00    Types: Cigarettes    Quit date: 09/08/1978    Years since quitting: 41.2  . Smokeless tobacco: Never Used  . Tobacco comment: quit smoking 27yr ago  Vaping Use  . Vaping Use: Never used  Substance and Sexual Activity  . Alcohol use: No  . Drug use: No  . Sexual activity: Never    Birth control/protection: Post-menopausal  Other Topics Concern  . Not on file  Social History Narrative   Right handed    Lives with husband    Social Determinants of Health   Financial Resource Strain:   . Difficulty of Paying Living Expenses:   Food Insecurity:     . Worried About RCharity fundraiserin the Last Year:   . RArboriculturistin the Last Year:   Transportation Needs:   . LFilm/video editor(Medical):   .Marland KitchenLack of Transportation (Non-Medical):   Physical Activity:   . Days of Exercise per Week:   . Minutes of Exercise per Session:   Stress:   . Feeling of Stress :   Social Connections:   . Frequency of Communication with Friends and Family:   . Frequency of Social Gatherings with Friends and Family:   . Attends Religious Services:   . Active Member of Clubs or Organizations:   . Attends CArchivistMeetings:   .Marland KitchenMarital Status:   Intimate Partner Violence:   . Fear of Current or Ex-Partner:   . Emotionally Abused:   .Marland KitchenPhysically Abused:   . Sexually Abused:     Review of Systems  Constitutional: Positive for fatigue.  HENT: Negative for postnasal drip and rhinorrhea.   Eyes: Negative.   Respiratory: Positive for shortness of breath. Negative for cough.   Cardiovascular: Negative.   Gastrointestinal: Negative.   Endocrine: Negative.   Genitourinary: Negative.   Musculoskeletal: Positive for arthralgias and back pain.       Knee pain  Allergic/Immunologic: Positive for environmental allergies.  Neurological: Negative.   Hematological: Negative.   Psychiatric/Behavioral: Positive for dysphoric mood and sleep disturbance.  All other systems reviewed and are negative.   Vitals:   11/14/19 1327  BP: (!) 126/54  Pulse: 88  Temp: 98.4 F (36.9 C)  SpO2: 95%     Physical Exam Constitutional:      General: She is not in acute distress.    Appearance: She is well-developed. She is not diaphoretic.  HENT:     Head: Normocephalic and atraumatic.  Eyes:     General:        Right eye: No discharge.        Left eye: No discharge.     Pupils: Pupils are equal, round,  and reactive to light.  Neck:     Thyroid: No thyromegaly.     Trachea: No tracheal deviation.  Cardiovascular:     Rate and Rhythm:  Normal rate and regular rhythm.  Pulmonary:     Effort: Pulmonary effort is normal. No respiratory distress.     Breath sounds: Rales present. No wheezing.  Musculoskeletal:     Cervical back: Normal range of motion and neck supple.     Right lower leg: Edema present.     Left lower leg: Edema present.    Compliance data not available today  Recent echocardiogram reviewed showing diastolic dysfunction, normal ejection fraction 10/18/2019  Data Reviewed: Prominent interstitial markings on x-ray reviewed by myself-10/16/2017 Pulmonary function study-no obstruction, mild restriction CT scan reviewed Recent echocardiogram from 2019 reviewed  Assessment:  Chronic shortness of breath-multifactorial -Part of this may be deconditioning limitations from a chronic back pain -Shortness of breath is about the same  Chronic hypersensitivity pneumonitis -Steroids initiated at 30 mg, currently on 20 mg daily -She will be cutting down to 10 mg at the end of the month -Follow-up closely in about a month -Importance of exercise and watching her diet discussed -Risk profile of steroids and side effects discussed -Add Bactrim for PCP prophylaxis  Significant back pain and knee pain  -This is chronic   Leg swelling is new -Initiate Lasix 40 daily for 3 days and then 20 mg daily -Follow-up with BMP  Previous smoker, significant exposure to secondhand smoke in the past-no underlying lung disease known -PFT does not reveal any significant obstruction  History of allergies for which she receives allergy shots  Plan/Recommendations:  Continue to use albuterol as needed  Continue CPAP use -Better tolerated at present  Continue decreasing doses of prednisone -Add Bactrim for PCP prophylaxis  Lasix added for leg swelling  Regular exercises encouraged  Shortness of breath is multifactorial, part of this may be related to musculoskeletal pain and discomfort and also deconditioning  Use  of vitamin D and calcium discussed  Repeat CT chest end of August   Sherrilyn Rist MD [facility] 11/14/2019, 1:38 PM  CC: Caren Macadam, MD

## 2019-11-16 ENCOUNTER — Ambulatory Visit: Payer: Medicare HMO | Admitting: Physician Assistant

## 2019-11-16 ENCOUNTER — Telehealth: Payer: Self-pay | Admitting: Pulmonary Disease

## 2019-11-16 DIAGNOSIS — J3089 Other allergic rhinitis: Secondary | ICD-10-CM | POA: Diagnosis not present

## 2019-11-16 DIAGNOSIS — J301 Allergic rhinitis due to pollen: Secondary | ICD-10-CM | POA: Diagnosis not present

## 2019-11-16 DIAGNOSIS — J3081 Allergic rhinitis due to animal (cat) (dog) hair and dander: Secondary | ICD-10-CM | POA: Diagnosis not present

## 2019-11-16 NOTE — Telephone Encounter (Signed)
Instructions  Hypersensitivity pneumonitis  Continue with prednisone Dose change her prednisone to 10 mg daily on August 1 Decrease to 5 mg daily September 1  We will prescribe Bactrim-prophylaxis for PCP infection in her lungs and people were using chronic steroids  We will add Lasix for your leg swelling 40 mg daily for 3 days Then 20 mg daily  Continue graded exercises  Obstructive sleep apnea -Continue with CPAP therapy  Your recent echocardiogram is reassuring, some abnormal relaxation-management is to optimize your risk profile -Exercise, weight loss  I will see you in 4 to 6 weeks     Called pt's pharmacy and spoke with Tammy, pharmacy tech letting her know the info stated by AO about pt's prednisone instructions. She verbalized understanding and stated she has made a note on pt's file about these instructions. Nothing further needed.

## 2019-11-18 ENCOUNTER — Other Ambulatory Visit (INDEPENDENT_AMBULATORY_CARE_PROVIDER_SITE_OTHER): Payer: Medicare HMO

## 2019-11-18 DIAGNOSIS — E871 Hypo-osmolality and hyponatremia: Secondary | ICD-10-CM | POA: Diagnosis not present

## 2019-11-18 LAB — BASIC METABOLIC PANEL
BUN: 14 mg/dL (ref 6–23)
CO2: 27 mEq/L (ref 19–32)
Calcium: 9.2 mg/dL (ref 8.4–10.5)
Chloride: 102 mEq/L (ref 96–112)
Creatinine, Ser: 1.14 mg/dL (ref 0.40–1.20)
GFR: 46.88 mL/min — ABNORMAL LOW (ref >60.00)
Glucose, Bld: 144 mg/dL — ABNORMAL HIGH (ref 70–99)
Potassium: 4.4 mEq/L (ref 3.5–5.1)
Sodium: 137 mEq/L (ref 135–145)

## 2019-11-19 ENCOUNTER — Other Ambulatory Visit: Payer: Self-pay | Admitting: Pulmonary Disease

## 2019-11-21 ENCOUNTER — Telehealth: Payer: Self-pay | Admitting: Pulmonary Disease

## 2019-11-21 NOTE — Telephone Encounter (Signed)
Spoke with patient and provided Dr. Matilde Bash recommendations:  BMP looks normal except for slight elevation in glucose to 144  As per Dr. Judson Roch note prednisone is being tapered down. Ask her to go back up to the 20 mg of prednisone a day.   Regarding the swelling her echocardiogram just showed diastolic heart failure.  There is no evidence of pulmonary hypertension and the heart was squeezing well.   Increase Lasix to 40 mg twice daily for 5 days and then back to 20 mg a day.  If nosebleed recurs then hold aspirin  Advised to call back if she is not improving.

## 2019-11-21 NOTE — Telephone Encounter (Signed)
BMP looks normal except for slight elevation in glucose to 144  As per Dr. Judson Roch note prednisone is being tapered down. Ask her to go back up to the 20 mg of prednisone a day.   Regarding the swelling her echocardiogram just showed diastolic heart failure.  There is no evidence of pulmonary hypertension and the heart was squeezing well.   Increase Lasix to 40 mg twice daily for 5 days and then back to 20 mg a day.  If nosebleed recurs then hold aspirin

## 2019-11-21 NOTE — Telephone Encounter (Signed)
Spoke with patient, she was prescribed lasix for swelling and bactrim 3 times per week for prophylaxis for pcp infection in lungs, started on 11/16/2019.  Patient states that the lasix has not improved her swelling.  She states she is not feeling well at all, reports chills, nausea and swelling over the weekend.  She also reports a nose bleed with a lot of blood and spit up some blood on Sunday, 11/20/2019, one episode only.  She is only on an 81 mg asa.  She is also inquiring about her BMP from 7/30 which was abnormal.  Please advise.  Dr. Vaughan Browner, please advise as Dr. Ander Slade is on vacation this week.  Thank you.

## 2019-11-24 DIAGNOSIS — J3081 Allergic rhinitis due to animal (cat) (dog) hair and dander: Secondary | ICD-10-CM | POA: Diagnosis not present

## 2019-11-24 DIAGNOSIS — J3089 Other allergic rhinitis: Secondary | ICD-10-CM | POA: Diagnosis not present

## 2019-11-24 DIAGNOSIS — J301 Allergic rhinitis due to pollen: Secondary | ICD-10-CM | POA: Diagnosis not present

## 2019-11-28 DIAGNOSIS — G4733 Obstructive sleep apnea (adult) (pediatric): Secondary | ICD-10-CM | POA: Diagnosis not present

## 2019-11-30 DIAGNOSIS — G4733 Obstructive sleep apnea (adult) (pediatric): Secondary | ICD-10-CM | POA: Diagnosis not present

## 2019-12-02 DIAGNOSIS — G4733 Obstructive sleep apnea (adult) (pediatric): Secondary | ICD-10-CM | POA: Diagnosis not present

## 2019-12-05 ENCOUNTER — Encounter: Payer: Self-pay | Admitting: Physician Assistant

## 2019-12-05 ENCOUNTER — Other Ambulatory Visit: Payer: Self-pay

## 2019-12-05 ENCOUNTER — Ambulatory Visit (INDEPENDENT_AMBULATORY_CARE_PROVIDER_SITE_OTHER): Payer: Medicare HMO | Admitting: Physician Assistant

## 2019-12-05 DIAGNOSIS — R110 Nausea: Secondary | ICD-10-CM | POA: Diagnosis not present

## 2019-12-05 DIAGNOSIS — F331 Major depressive disorder, recurrent, moderate: Secondary | ICD-10-CM

## 2019-12-05 DIAGNOSIS — R69 Illness, unspecified: Secondary | ICD-10-CM | POA: Diagnosis not present

## 2019-12-05 DIAGNOSIS — F411 Generalized anxiety disorder: Secondary | ICD-10-CM

## 2019-12-05 DIAGNOSIS — G4733 Obstructive sleep apnea (adult) (pediatric): Secondary | ICD-10-CM

## 2019-12-05 MED ORDER — SERTRALINE HCL 100 MG PO TABS: 150.0000 mg | ORAL_TABLET | Freq: Every day | ORAL | 1 refills | Status: DC

## 2019-12-05 MED ORDER — LORAZEPAM 0.5 MG PO TABS
0.5000 mg | ORAL_TABLET | Freq: Four times a day (QID) | ORAL | 0 refills | Status: DC | PRN
Start: 2019-12-05 — End: 2020-12-07

## 2019-12-05 NOTE — Patient Instructions (Signed)
Increase Zoloft to a total of 150 mg per day.

## 2019-12-05 NOTE — Progress Notes (Signed)
[facility] Check  Patient ID: Beverly Ballard,  MRN: [id number]  PCP: Caren Macadam, MD  Date of Evaluation: 12/05/2019 Time spent:30 minutes  Chief Complaint:  Chief Complaint    Depression; Anxiety      HISTORY/CURRENT STATUS: HPI For routine med check.  Hasn't been doing well. Has had pneumonia since LOV. States she has something wrong with her immune system. "My health is frustrating. The past 2 years have been awful. I can't go home [place]) and that's depressing." States several months ago she felt hopeless, 'like I didn't want to be here,' but I feel better with that now. I'm not going to hurt myself.' Also has chronic nausea.  Has seen GI and he has 'done every test known to man.'  Hasn't had the energy to do much. But yesterday, she did can some vegetables. Usually doesn't have the motivation. Not isolating. Doesn't cry easily. Not able to enjoy things. She does work cross-word puzzles. "it's a chore to shower, but I always take care of my personal hygiene." No SI/HI.  Having more anxiety too.  Mostly generalized, but sometimes has panic attacks, not even weekly.   Patient denies increased energy with decreased need for sleep, no increased talkativeness, no racing thoughts, no impulsivity or risky behaviors, no increased spending, no increased libido, no grandiosity, no increased irritability or anger, no paranoia,and no hallucinations.  Denies dizziness, syncope, seizures, numbness, tingling, tremor, tics, unsteady gait, slurred speech, confusion. Denies muscle or joint pain, stiffness, or dystonia.  Individual Medical History/ Review of Systems: Changes? :Yes  now has CPAP, also has chronic nausea.  Had pneumonia, since LOV. See notes on her chart.   Past medications for mental health diagnoses include: Paxil, Prozac, Valium  Allergies: Lamotrigine, Bupropion, Carbamazepine, Levetiracetam, Tramadol, Adhesive [tape], Clarithromycin, Doxycycline, Nickel, Other,  Topamax [topiramate], Estradiol, Latex, and Phenytoin sodium extended  Current Medications:  Current Outpatient Medications:    albuterol (VENTOLIN HFA) 108 (90 Base) MCG/ACT inhaler, Inhale 2 puffs into the lungs every 4 (four) hours as needed for wheezing or shortness of breath., Disp: 18 g, Rfl: 0   aspirin 81 MG tablet, Take 81 mg by mouth daily., Disp: , Rfl:    b complex vitamins tablet, Take 1 tablet by mouth daily., Disp: , Rfl:    beta carotene w/minerals (OCUVITE) tablet, Take 1 tablet by mouth daily., Disp: , Rfl:    Cholecalciferol (VITAMIN D3 PO), Take by mouth., Disp: , Rfl:    cyclobenzaprine (FLEXERIL) 10 MG tablet, Take 1 tablet (10 mg total) by mouth 2 (two) times daily as needed for muscle spasms., Disp: 30 tablet, Rfl: 0   EPINEPHrine 0.3 mg/0.3 mL IJ SOAJ injection, SMARTSIG:Injection As Directed, Disp: , Rfl:    esomeprazole (NEXIUM) 20 MG capsule, Take 2 capsules (40 mg total) by mouth daily before breakfast. (Patient taking differently: Take 20 mg by mouth daily before breakfast. ), Disp: 2 capsule, Rfl: 0   fluticasone (FLONASE) 50 MCG/ACT nasal spray, Place 2 sprays into both nostrils daily. (Patient taking differently: Place 2 sprays into both nostrils daily as needed for allergies. ), Disp: 16 g, Rfl: 6   furosemide (LASIX) 20 MG tablet, Take 1 tablet (20 mg total) by mouth in the morning., Disp: 30 tablet, Rfl: 1   levocetirizine (XYZAL) 5 MG tablet, Take 5 mg by mouth daily. , Disp: , Rfl:    levothyroxine (SYNTHROID) 88 MCG tablet, Take 1 tablet (88 mcg total) by mouth daily., Disp: 90 tablet, Rfl: 3  lisinopril (ZESTRIL) 5 MG tablet, Take 1 tablet (5 mg total) by mouth daily., Disp: 90 tablet, Rfl: 1   ondansetron (ZOFRAN ODT) 4 MG disintegrating tablet, Take 1 tablet (4 mg total) by mouth every 6 (six) hours as needed for nausea or vomiting., Disp: 30 tablet, Rfl: 3   pravastatin (PRAVACHOL) 20 MG tablet, Take 1 tablet (20 mg total) by mouth daily.  TAKE 1 TABLET BY MOUTH EVERY DAY, Disp: 90 tablet, Rfl: 1   predniSONE (DELTASONE) 10 MG tablet, Take 1 tablet (10 mg total) by mouth as directed., Disp: 90 tablet, Rfl: 0   predniSONE (DELTASONE) 5 MG tablet, Take 1 tablet (5 mg total) by mouth daily with breakfast., Disp: 30 tablet, Rfl: 1   sertraline (ZOLOFT) 100 MG tablet, Take 1.5 tablets (150 mg total) by mouth daily., Disp: 135 tablet, Rfl: 1   Spacer/Aero-Holding Chambers DEVI, Use as directed with inhaler, Disp: 1 each, Rfl: 0   sulfamethoxazole-trimethoprim (BACTRIM) 400-80 MG tablet, Take 1 tablet by mouth 3 (three) times a week., Disp: 30 tablet, Rfl: 0   zonisamide (ZONEGRAN) 100 MG capsule, Take 2 capsules (200 mg total) by mouth 2 (two) times daily. (Patient taking differently: Take 200 mg by mouth in the morning, at noon, and at bedtime. ), Disp: 120 capsule, Rfl: 4   dicyclomine (BENTYL) 10 MG capsule, Take 1 capsule (10 mg total) by mouth every 8 (eight) hours as needed for spasms. (Patient not taking: Reported on 12/05/2019), Disp: 30 capsule, Rfl: 3   LORazepam (ATIVAN) 0.5 MG tablet, Take 1 tablet (0.5 mg total) by mouth 4 (four) times daily as needed for anxiety., Disp: 60 tablet, Rfl: 0 Medication Side Effects: none  Family Medical/ Social History: Changes? No  MENTAL HEALTH EXAM:  There were no vitals taken for this visit.There is no height or weight on file to calculate BMI.  General Appearance: Casual, Neat, Well Groomed and Obese  Eye Contact:  Good  Speech:  Clear and Coherent  Volume:  Normal  Mood:  Depressed  Affect:  Depressed  Thought Process:  Goal Directed  Orientation:  Full (Time, Place, and Person)  Thought Content: Logical   Suicidal Thoughts:  No  Homicidal Thoughts:  No  Memory:  WNL  Judgement:  Good  Insight:  Good  Psychomotor Activity:  Normal  Concentration:  Concentration: Good  Recall:  Good  Fund of Knowledge: Good  Language: Good  Assets:  Desire for Improvement  ADL's:   Intact  Cognition: WNL  Prognosis:  Good    DIAGNOSES:    ICD-10-CM   1. Major depressive disorder, recurrent episode, moderate (HCC)  F33.1   2. Obstructive sleep apnea  G47.33   3. Generalized anxiety disorder  F41.1   4. Chronic nausea  R11.0     Receiving Psychotherapy: Yes With Dr. Glennon Hamilton (she's not back from maternity leave yet.)   RECOMMENDATIONS:  PDMP reviewed. I provided 30 minutes of face to face time during this encounter. We discussed different options for the anxiety and depression.  I do recommend we increase the Zoloft.  She is willing to do that.  A couple of options for the anxiety could include Ativan or hydroxyzine.  Both of those may also help with nausea.  No Ativan is used in oncology to help with anxiety and nausea so hopefully this will help in this situation.  Benefits, risks, side effects were discussed and she accepts. Start Ativan 0.5 mg 1 p.o. 4 times daily as needed.  She understands when to take it, and by symptoms will be her guide in that regard.  I know she will not take it 4 times a day and may not even take it once a day.  But if it will help some, I strongly encourage it. Increase Zoloft to 150 mg p.o. daily. Continue use of CPAP. Try to get back into therapy soon, if not with Dr. Glennon Hamilton, with someone either here or out in the community.   Return in 4 weeks.  Donnal Moat, PA-C

## 2019-12-07 ENCOUNTER — Other Ambulatory Visit: Payer: Self-pay | Admitting: Pulmonary Disease

## 2019-12-08 DIAGNOSIS — J3089 Other allergic rhinitis: Secondary | ICD-10-CM | POA: Diagnosis not present

## 2019-12-08 DIAGNOSIS — J3081 Allergic rhinitis due to animal (cat) (dog) hair and dander: Secondary | ICD-10-CM | POA: Diagnosis not present

## 2019-12-08 DIAGNOSIS — J301 Allergic rhinitis due to pollen: Secondary | ICD-10-CM | POA: Diagnosis not present

## 2019-12-13 DIAGNOSIS — G4733 Obstructive sleep apnea (adult) (pediatric): Secondary | ICD-10-CM | POA: Diagnosis not present

## 2019-12-15 ENCOUNTER — Ambulatory Visit (INDEPENDENT_AMBULATORY_CARE_PROVIDER_SITE_OTHER)
Admission: RE | Admit: 2019-12-15 | Discharge: 2019-12-15 | Disposition: A | Discharge status: Home / Self Care | Payer: Medicare HMO | Source: Ambulatory Visit | Attending: Pulmonary Disease | Admitting: Pulmonary Disease

## 2019-12-15 ENCOUNTER — Other Ambulatory Visit: Payer: Self-pay

## 2019-12-15 DIAGNOSIS — I10 Essential (primary) hypertension: Secondary | ICD-10-CM | POA: Diagnosis not present

## 2019-12-15 DIAGNOSIS — J479 Bronchiectasis, uncomplicated: Secondary | ICD-10-CM

## 2019-12-15 DIAGNOSIS — I700 Atherosclerosis of aorta: Secondary | ICD-10-CM | POA: Diagnosis not present

## 2019-12-15 DIAGNOSIS — I2510 Atherosclerotic heart disease of native coronary artery without angina pectoris: Secondary | ICD-10-CM | POA: Diagnosis not present

## 2019-12-15 DIAGNOSIS — I270 Primary pulmonary hypertension: Secondary | ICD-10-CM | POA: Diagnosis not present

## 2019-12-19 ENCOUNTER — Ambulatory Visit: Payer: Medicare HMO | Admitting: Adult Health

## 2019-12-19 ENCOUNTER — Telehealth (INDEPENDENT_AMBULATORY_CARE_PROVIDER_SITE_OTHER): Payer: Medicare HMO | Admitting: Family Medicine

## 2019-12-19 ENCOUNTER — Encounter: Payer: Self-pay | Admitting: Family Medicine

## 2019-12-19 VITALS — Ht 65.0 in | Wt 244.0 lb

## 2019-12-19 DIAGNOSIS — R059 Cough, unspecified: Secondary | ICD-10-CM

## 2019-12-19 DIAGNOSIS — J479 Bronchiectasis, uncomplicated: Secondary | ICD-10-CM

## 2019-12-19 DIAGNOSIS — R05 Cough: Secondary | ICD-10-CM

## 2019-12-19 DIAGNOSIS — R0600 Dyspnea, unspecified: Secondary | ICD-10-CM

## 2019-12-19 NOTE — Progress Notes (Signed)
Patient ID: Beverly Ballard, female   DOB: [birth year], 74 y.o.   MRN: [id number]   This visit type was conducted due to national recommendations for restrictions regarding the COVID-19 pandemic in an effort to limit this patient's exposure and mitigate transmission in our community.   Virtual Visit via Video Note  I connected with Beverly Ballard on 12/19/19 at  4:15 PM EDT by a video enabled telemedicine application and verified that I am speaking with the correct person using two identifiers.  Location patient: home Location provider:work or home office Persons participating in the virtual visit: patient, provider  I discussed the limitations of evaluation and management by telemedicine and the availability of in person appointments. The patient expressed understanding and agreed to proceed.   HPI: Beverly Ballard states that last Thursday she had onset of cold-like symptoms.  She noticed some mild cough and some nasal congestion.  Her husband apparently is recovering from similar symptoms.  She recalls having temperature of 99.9 over the weekend but no definite fever today.  She has had some increased dyspnea over baseline.  She has had Covid vaccine.  Has not been tested for Covid.  She has chronic problems including history of hypertension, bronchiectasis, hypothyroidism  Followed by pulmonary and had CT chest last Thursday which showed no major interval change in mild to moderate pulmonary fibrosis.  She has diffuse bronchiectasis changes.  Denies any nausea, vomiting, or diarrhea.  She has maintained per pulmonary on Bactrim 3 times weekly and low-dose prednisone.  She does have a pulse oximeter at home and O2 sats of 92% currently.  She is not sure what her typical baseline is.   ROS: See pertinent positives and negatives per HPI.  Past Medical History:  Diagnosis Date  . Allergy   . Anxiety and depression 12/19/2009  . Arthritis   . Cancer (HCC)    vaginal  . Constipation   . Degenerative  disorder of eye   . Diverticulosis   . Essential hypertension, benign 06/26/2009  . GERD 05/27/2007   takes Nexium daily  . Heart murmur    yrs ago  . History of gout   . History of migraine many yrs ago  . Hyperlipidemia    takes Pravastatin daily  . Hypothyroidism   . Insomnia    but doesn't take any meds  . LOW BACK PAIN 05/27/2007  . Numbness    left toes   . Osteoporosis   . Seizures ([place])    last 1983, none since then- last seizure 1988 per pt     Past Surgical History:  Procedure Laterality Date  . ADENOIDECTOMY    . BACK SURGERY     lumbar x2  . BRONCHIAL BIOPSY  09/12/2019   Procedure: BRONCHIAL BIOPSIES;  Surgeon: Laurin Coder, MD;  Location: [facility];  Service: Endoscopy;;  . BRONCHIAL WASHINGS  09/12/2019   Procedure: BRONCHIAL WASHINGS;  Surgeon: Laurin Coder, MD;  [facility];  Service: Endoscopy;;  . COLONOSCOPY  2009  . ESOPHAGOGASTRODUODENOSCOPY    . Heel spurs Bilateral   . HEMOSTASIS CONTROL  09/12/2019   Procedure: HEMOSTASIS CONTROL;  Surgeon: Laurin Coder, MD;  [facility];  Service: Endoscopy;;  epi  . KNEE ARTHROPLASTY Left 11/07/2013   Procedure: COMPUTER ASSISTED TOTAL KNEE ARTHROPLASTY;  Surgeon: Marybelle Killings, MD;  Location: [facility];  Service: Orthopedics;  Laterality: Left;  Left Total Knee Arthroplasty, Cemented, Computer Assist  . TONSILLECTOMY    . TOTAL  KNEE REVISION Left 09/19/2014   Procedure: LEFT TOTAL KNEE REVISION;  Surgeon: Leandrew Koyanagi, MD;  Location: [facility];  Service: Orthopedics;  Laterality: Left;  . UPPER GASTROINTESTINAL ENDOSCOPY    . VIDEO BRONCHOSCOPY N/A 09/12/2019   Procedure: VIDEO BRONCHOSCOPY WITH FLUORO;  Surgeon: Laurin Coder, MD;  [facility];  Service: Endoscopy;  Laterality: N/A;    Family History  Problem Relation Age of Onset  . Aortic dissection Father   . Arthritis Mother   . Stroke Mother 1  . Heart murmur Other   . Colon cancer Neg Hx   . Esophageal  cancer Neg Hx   . Stomach cancer Neg Hx   . Rectal cancer Neg Hx   . Colon polyps Neg Hx     SOCIAL HX: Reportedly quit smoking 1980   Current Outpatient Medications:  .  albuterol (VENTOLIN HFA) 108 (90 Base) MCG/ACT inhaler, Inhale 2 puffs into the lungs every 4 (four) hours as needed for wheezing or shortness of breath., Disp: 18 g, Rfl: 0 .  aspirin 81 MG tablet, Take 81 mg by mouth daily., Disp: , Rfl:  .  b complex vitamins tablet, Take 1 tablet by mouth daily., Disp: , Rfl:  .  beta carotene w/minerals (OCUVITE) tablet, Take 1 tablet by mouth daily., Disp: , Rfl:  .  Cholecalciferol (VITAMIN D3 PO), Take by mouth., Disp: , Rfl:  .  cyclobenzaprine (FLEXERIL) 10 MG tablet, Take 1 tablet (10 mg total) by mouth 2 (two) times daily as needed for muscle spasms., Disp: 30 tablet, Rfl: 0 .  dicyclomine (BENTYL) 10 MG capsule, Take 1 capsule (10 mg total) by mouth every 8 (eight) hours as needed for spasms., Disp: 30 capsule, Rfl: 3 .  EPINEPHrine 0.3 mg/0.3 mL IJ SOAJ injection, SMARTSIG:Injection As Directed, Disp: , Rfl:  .  esomeprazole (NEXIUM) 20 MG capsule, Take 2 capsules (40 mg total) by mouth daily before breakfast. (Patient taking differently: Take 20 mg by mouth daily before breakfast. ), Disp: 2 capsule, Rfl: 0 .  fluticasone (FLONASE) 50 MCG/ACT nasal spray, Place 2 sprays into both nostrils daily. (Patient taking differently: Place 2 sprays into both nostrils daily as needed for allergies. ), Disp: 16 g, Rfl: 6 .  furosemide (LASIX) 20 MG tablet, TAKE 1 TABLET (20 MG TOTAL) BY MOUTH IN THE MORNING., Disp: 30 tablet, Rfl: 1 .  levocetirizine (XYZAL) 5 MG tablet, Take 5 mg by mouth daily. , Disp: , Rfl:  .  levothyroxine (SYNTHROID) 88 MCG tablet, Take 1 tablet (88 mcg total) by mouth daily., Disp: 90 tablet, Rfl: 3 .  lisinopril (ZESTRIL) 5 MG tablet, Take 1 tablet (5 mg total) by mouth daily., Disp: 90 tablet, Rfl: 1 .  LORazepam (ATIVAN) 0.5 MG tablet, Take 1 tablet (0.5 mg  total) by mouth 4 (four) times daily as needed for anxiety., Disp: 60 tablet, Rfl: 0 .  ondansetron (ZOFRAN ODT) 4 MG disintegrating tablet, Take 1 tablet (4 mg total) by mouth every 6 (six) hours as needed for nausea or vomiting., Disp: 30 tablet, Rfl: 3 .  pravastatin (PRAVACHOL) 20 MG tablet, Take 1 tablet (20 mg total) by mouth daily. TAKE 1 TABLET BY MOUTH EVERY DAY, Disp: 90 tablet, Rfl: 1 .  predniSONE (DELTASONE) 5 MG tablet, Take 1 tablet (5 mg total) by mouth daily with breakfast., Disp: 30 tablet, Rfl: 1 .  sertraline (ZOLOFT) 100 MG tablet, Take 1.5 tablets (150 mg total) by mouth daily., Disp: 135 tablet, Rfl:  1 .  Spacer/Aero-Holding Dorise Bullion, Use as directed with inhaler, Disp: 1 each, Rfl: 0 .  sulfamethoxazole-trimethoprim (BACTRIM) 400-80 MG tablet, Take 1 tablet by mouth 3 (three) times a week., Disp: 30 tablet, Rfl: 0 .  zonisamide (ZONEGRAN) 100 MG capsule, Take 2 capsules (200 mg total) by mouth 2 (two) times daily. (Patient taking differently: Take 200 mg by mouth in the morning, at noon, and at bedtime. ), Disp: 120 capsule, Rfl: 4 .  predniSONE (DELTASONE) 10 MG tablet, Take 1 tablet (10 mg total) by mouth as directed., Disp: 90 tablet, Rfl: 0  EXAM:  VITALS per patient if applicable:  GENERAL: alert, oriented, appears well and in no acute distress  HEENT: atraumatic, conjunttiva clear, no obvious abnormalities on inspection of external nose and ears  NECK: normal movements of the head and neck  LUNGS: on inspection no signs of respiratory distress, breathing rate appears normal, no obvious gross SOB, gasping or wheezing  CV: no obvious cyanosis  MS: moves all visible extremities without noticeable abnormality  PSYCH/NEURO: pleasant and cooperative, no obvious depression or anxiety, speech and thought processing grossly intact  ASSESSMENT AND PLAN:  Discussed the following assessment and plan:  Patient with chronic lung disease with hypersensitivity  pneumonitis and obstructive sleep apnea and some chronic dyspnea at baseline who has had some increased cough and possible low-grade fever over the weekend.  She has had Covid vaccine.  -We recommend that she get Covid tested to further clarify -Monitor pulse oximetry frequently and be in touch with [place] or pulmonary if this is dropping especially below 90%. -We also advised ER evaluation if she has any worsening dyspnea or other concerns. -She knows to stay quarantined until further clarified     I discussed the assessment and treatment plan with the patient. The patient was provided an opportunity to ask questions and all were answered. The patient agreed with the plan and demonstrated an understanding of the instructions.   The patient was advised to call back or seek an in-person evaluation if the symptoms worsen or if the condition fails to improve as anticipated.     Carolann Littler, MD

## 2019-12-20 ENCOUNTER — Encounter (HOSPITAL_BASED_OUTPATIENT_CLINIC_OR_DEPARTMENT_OTHER): Payer: Self-pay | Admitting: *Deleted

## 2019-12-20 ENCOUNTER — Emergency Department (HOSPITAL_BASED_OUTPATIENT_CLINIC_OR_DEPARTMENT_OTHER)
Admission: EM | Admit: 2019-12-20 | Discharge: 2019-12-21 | Disposition: A | Discharge status: Home / Self Care | Payer: Medicare HMO | Attending: Emergency Medicine | Admitting: Emergency Medicine

## 2019-12-20 ENCOUNTER — Other Ambulatory Visit: Payer: Self-pay

## 2019-12-20 ENCOUNTER — Other Ambulatory Visit: Payer: Self-pay | Admitting: Pulmonary Disease

## 2019-12-20 ENCOUNTER — Emergency Department (HOSPITAL_BASED_OUTPATIENT_CLINIC_OR_DEPARTMENT_OTHER): Payer: Medicare HMO

## 2019-12-20 ENCOUNTER — Telehealth: Payer: Self-pay | Admitting: Pulmonary Disease

## 2019-12-20 DIAGNOSIS — R0789 Other chest pain: Secondary | ICD-10-CM | POA: Insufficient documentation

## 2019-12-20 DIAGNOSIS — Z7982 Long term (current) use of aspirin: Secondary | ICD-10-CM | POA: Diagnosis not present

## 2019-12-20 DIAGNOSIS — R509 Fever, unspecified: Secondary | ICD-10-CM | POA: Diagnosis not present

## 2019-12-20 DIAGNOSIS — Z8544 Personal history of malignant neoplasm of other female genital organs: Secondary | ICD-10-CM | POA: Diagnosis not present

## 2019-12-20 DIAGNOSIS — I272 Pulmonary hypertension, unspecified: Secondary | ICD-10-CM | POA: Diagnosis not present

## 2019-12-20 DIAGNOSIS — E039 Hypothyroidism, unspecified: Secondary | ICD-10-CM | POA: Diagnosis not present

## 2019-12-20 DIAGNOSIS — R0609 Other forms of dyspnea: Secondary | ICD-10-CM | POA: Insufficient documentation

## 2019-12-20 DIAGNOSIS — R0902 Hypoxemia: Secondary | ICD-10-CM | POA: Diagnosis not present

## 2019-12-20 DIAGNOSIS — Z87891 Personal history of nicotine dependence: Secondary | ICD-10-CM | POA: Insufficient documentation

## 2019-12-20 DIAGNOSIS — R5383 Other fatigue: Secondary | ICD-10-CM | POA: Diagnosis not present

## 2019-12-20 DIAGNOSIS — I2721 Secondary pulmonary arterial hypertension: Secondary | ICD-10-CM

## 2019-12-20 DIAGNOSIS — Z20822 Contact with and (suspected) exposure to covid-19: Secondary | ICD-10-CM | POA: Diagnosis not present

## 2019-12-20 DIAGNOSIS — Z209 Contact with and (suspected) exposure to unspecified communicable disease: Secondary | ICD-10-CM | POA: Diagnosis not present

## 2019-12-20 DIAGNOSIS — R918 Other nonspecific abnormal finding of lung field: Secondary | ICD-10-CM | POA: Diagnosis not present

## 2019-12-20 DIAGNOSIS — R0602 Shortness of breath: Secondary | ICD-10-CM | POA: Diagnosis not present

## 2019-12-20 DIAGNOSIS — Z91040 Latex allergy status: Secondary | ICD-10-CM | POA: Insufficient documentation

## 2019-12-20 DIAGNOSIS — Z96652 Presence of left artificial knee joint: Secondary | ICD-10-CM | POA: Diagnosis not present

## 2019-12-20 DIAGNOSIS — Z79899 Other long term (current) drug therapy: Secondary | ICD-10-CM | POA: Diagnosis not present

## 2019-12-20 DIAGNOSIS — R05 Cough: Secondary | ICD-10-CM | POA: Insufficient documentation

## 2019-12-20 DIAGNOSIS — I10 Essential (primary) hypertension: Secondary | ICD-10-CM | POA: Diagnosis not present

## 2019-12-20 DIAGNOSIS — R5381 Other malaise: Secondary | ICD-10-CM | POA: Insufficient documentation

## 2019-12-20 DIAGNOSIS — R110 Nausea: Secondary | ICD-10-CM | POA: Diagnosis not present

## 2019-12-20 LAB — CBC
HCT: 41.9 % (ref 36.0–46.0)
Hemoglobin: 13.4 g/dL (ref 12.0–15.0)
MCH: 31 pg (ref 26.0–34.0)
MCHC: 32 g/dL (ref 30.0–36.0)
MCV: 97 fL (ref 80.0–100.0)
Platelets: 226 10*3/uL (ref 150–400)
RBC: 4.32 MIL/uL (ref 3.87–5.11)
RDW: 13 % (ref 11.5–15.5)
WBC: 11.3 10*3/uL — ABNORMAL HIGH (ref 4.0–10.5)
nRBC: 0 % (ref 0.0–0.2)

## 2019-12-20 LAB — BASIC METABOLIC PANEL
Anion gap: 9 (ref 5–15)
BUN: 11 mg/dL (ref 8–23)
CO2: 28 mmol/L (ref 22–32)
Calcium: 8.9 mg/dL (ref 8.9–10.3)
Chloride: 101 mmol/L (ref 98–111)
Creatinine, Ser: 0.94 mg/dL (ref 0.44–1.00)
GFR calc Af Amer: >60 mL/min (ref >60)
GFR calc non Af Amer: >60 mL/min (ref >60)
Glucose, Bld: 108 mg/dL — ABNORMAL HIGH (ref 70–99)
Potassium: 4.4 mmol/L (ref 3.5–5.1)
Sodium: 138 mmol/L (ref 135–145)

## 2019-12-20 LAB — TROPONIN I (HIGH SENSITIVITY)
Troponin I (High Sensitivity): 6 ng/L (ref <18)
Troponin I (High Sensitivity): 6 ng/L (ref <18)

## 2019-12-20 LAB — SARS CORONAVIRUS 2 BY RT PCR (HOSPITAL ORDER, PERFORMED IN ~~LOC~~ HOSPITAL LAB): SARS Coronavirus 2: NEGATIVE

## 2019-12-20 MED ORDER — ALBUTEROL SULFATE HFA 108 (90 BASE) MCG/ACT IN AERS
4.0000 | INHALATION_SPRAY | Freq: Once | RESPIRATORY_TRACT | Status: AC
  Administered 2019-12-21: 4 via RESPIRATORY_TRACT
  Filled 2019-12-20: qty 6.7

## 2019-12-20 MED ORDER — IOHEXOL 350 MG/ML SOLN
100.0000 mL | Freq: Once | INTRAVENOUS | Status: AC | PRN
  Administered 2019-12-20: 100 mL via INTRAVENOUS

## 2019-12-20 MED ORDER — AMOXICILLIN-POT CLAVULANATE 875-125 MG PO TABS: 1.0000 | ORAL_TABLET | Freq: Two times a day (BID) | ORAL | 0 refills | Status: DC

## 2019-12-20 NOTE — Telephone Encounter (Signed)
Primary Pulmonologist: Dr Ander Slade Last office visit and with whom: 11/14/19, AO What do we see them for (pulmonary problems): Bronchiectasis Last OV assessment/plan:  Instructions  Hypersensitivity pneumonitis  Continue with prednisone Dose change her prednisone to 10 mg daily on August 1 Decrease to 5 mg daily September 1  We will prescribe Bactrim-prophylaxis for PCP infection in her lungs and people were using chronic steroids  We will add Lasix for your leg swelling 40 mg daily for 3 days Then 20 mg daily  Continue graded exercises  Obstructive sleep apnea -Continue with CPAP therapy  Your recent echocardiogram is reassuring, some abnormal relaxation-management is to optimize your risk profile -Exercise, weight loss  I will see you in 4 to 6 weeks  Repeat CT scan of the chest end of August-before next visit [facility] in next few days to check potassium and kidney functions       Reason for call:  Called and spoke with Patient.  Patient stated she cold symptoms last Thursday/Friday, that has gotten worse. Patient stated she is having a productive cough, brown colored sputum, wheezing, and increased sob.  Patient stated she has had some chest tightness, body aches, chills, and  low grade fever of 99 degrees oral. Patient stated she has no taste or smell.   Patient has had both Pfizer covid vaccines. Patient saw her PCP 8/30, and recommended her check O2 sats, keep sats 90% or above, seek ED care if symptoms get worse.  Patient stated her PCP recommended she get covid tested. Patient is scheduled to have a rapid covid test at 1200, today at [facility]. Patient stated she is taking Prednisone 5mg , but had not used her Albuterol inhaler. Advised Patient to use emergency inhaler for sob, wheezing, get covid tested, and seek emergency care for increased sob, chest pain, or discomfort.  Patient is requesting anything that may help her feel better. Patient uses [facility] [address]  Message routed to Dr Ander Slade to advise Allergies  Allergen Reactions  . Lamotrigine Swelling    Tongue swelling  . Bupropion Other (See Comments)    not known  . Carbamazepine Other (See Comments)    Hypnatremia  . Levetiracetam     Other reaction(s): Dizziness (intolerance)  . Tramadol Other (See Comments)    not known  . Adhesive [Tape]     ?  Blister after knee surgery  . Clarithromycin     Unsure of reaction    . Doxycycline     Interfered with seizure medication  . Nickel     Had to remove knee implant with nickel and replace it  . Other     Metal and perfumes  . Topamax [Topiramate] Nausea And Vomiting  . Estradiol Rash  . Latex Rash  . Phenytoin Sodium Extended Other (See Comments)    drowsiness    Immunization History  Administered Date(s) Administered  . Fluad Quad(high Dose 65+) 01/27/2019  . Influenza Whole 01/19/2009, 12/19/2009  . Influenza, High Dose Seasonal PF 01/08/2015, 01/03/2016, 01/01/2017, 01/28/2018  . Influenza,inj,Quad PF,6+ Mos 01/24/2014  . Influenza-Unspecified 01/06/2012  . PFIZER SARS-COV-2 Vaccination 07/02/2019, 07/23/2019  . Pneumococcal Conjugate-13 09/19/2009  . Pneumococcal Polysaccharide-23 06/20/2013  . Tdap 05/30/2014  . Varicella 09/23/2010

## 2019-12-20 NOTE — Telephone Encounter (Signed)
May have a viral or bacterial lower respiratory tract infection  I will call in a course of Augmentin to be used for 7 days

## 2019-12-20 NOTE — ED Provider Notes (Signed)
[facility] EMERGENCY DEPARTMENT Provider Note   CSN: [id number] Arrival date & time: 12/20/19  1647     History Chief Complaint  Patient presents with  . Fatigue    Beverly Ballard is a 74 y.o. female.  74yo F w/ PMH including bronchiectasis, HTN, HLD,  Pt has had lung problems for a while and follows w/ pulmonologist, has had biopsy recently that confirmed bronchiectasis. She's been on a prednisone taper for the past 3 months for lung inflammation, currently on 5mg  daily. She reports constant nausea and feels like she's been "dragged down" by this problem. 4 days ago, she began feeling bad with malaise, cough, SOB on exertion, chest tightness and wheezing, low grade fevers, loss of taste/smell. She went to UC today and was tested for COVID but doesn't have results yet. She was noted to have low O2 levels there and was sent here for further eval. Of note, several family members are currently sick with cold-like symptoms. She is vaccinated against COVID-19.   The history is provided by the patient.       Past Medical History:  Diagnosis Date  . Allergy   . Anxiety and depression 12/19/2009  . Arthritis   . Cancer (HCC)    vaginal  . Constipation   . Degenerative disorder of eye   . Diverticulosis   . Essential hypertension, benign 06/26/2009  . GERD 05/27/2007   takes Nexium daily  . Heart murmur    yrs ago  . History of gout   . History of migraine many yrs ago  . Hyperlipidemia    takes Pravastatin daily  . Hypothyroidism   . Insomnia    but doesn't take any meds  . LOW BACK PAIN 05/27/2007  . Numbness    left toes   . Osteoporosis   . Seizures ([place])    last 1983, none since then- last seizure 1988 per pt     Patient Active Problem List   Diagnosis Date Noted  . Ankle edema, bilateral 10/31/2019  . Excessive daytime sleepiness 06/28/2019  . Shortness of breath 06/28/2019  . Healthcare maintenance 06/28/2019  . ANA positive 06/28/2019  . Bronchiectasis  ([place]) 06/28/2019  . Depression, major, single episode, moderate ([place]) 11/19/2018  . Fatigue 03/23/2018  . Recurrent major depressive disorder, in partial remission ([place]) 05/04/2017  . Left knee pain 01/12/2017  . Ovarian cyst 10/30/2016  . Aortic atherosclerosis ([place]) 11/19/2015  . Hyperlipemia 04/03/2015  . S/P revision of total knee 09/19/2014  . Seizure disorder ([place]) 01/24/2014  . Obesity, BMI unknown 01/24/2014  . Osteopenia, stable on dexa 2010 - followed by gyn 06/15/2012  . Pap smear abnormality of cervix/human papillomavirus (HPV) positive - 09/2011, followed by gyn 06/15/2012  . Essential hypertension 07/03/2009  . Hypothyroidism 05/27/2007  . Allergic rhinitis 05/27/2007  . GERD 05/27/2007    Past Surgical History:  Procedure Laterality Date  . ADENOIDECTOMY    . BACK SURGERY     lumbar x2  . BRONCHIAL BIOPSY  09/12/2019   Procedure: BRONCHIAL BIOPSIES;  Surgeon: Laurin Coder, MD;  [facility];  Service: Endoscopy;;  . BRONCHIAL WASHINGS  09/12/2019   Procedure: BRONCHIAL WASHINGS;  Surgeon: Laurin Coder, MD;  [facility];  Service: Endoscopy;;  . COLONOSCOPY  2009  . ESOPHAGOGASTRODUODENOSCOPY    . Heel spurs Bilateral   . HEMOSTASIS CONTROL  09/12/2019   Procedure: HEMOSTASIS CONTROL;  Surgeon: Laurin Coder, MD;  [facility];  Service:  Endoscopy;;  epi  . KNEE ARTHROPLASTY Left 11/07/2013   Procedure: COMPUTER ASSISTED TOTAL KNEE ARTHROPLASTY;  Surgeon: Marybelle Killings, MD;  Location: [facility];  Service: Orthopedics;  Laterality: Left;  Left Total Knee Arthroplasty, Cemented, Computer Assist  . TONSILLECTOMY    . TOTAL KNEE REVISION Left 09/19/2014   Procedure: LEFT TOTAL KNEE REVISION;  Surgeon: Leandrew Koyanagi, MD;  Location: [facility];  Service: Orthopedics;  Laterality: Left;  . UPPER GASTROINTESTINAL ENDOSCOPY    . VIDEO BRONCHOSCOPY N/A 09/12/2019   Procedure: VIDEO BRONCHOSCOPY WITH FLUORO;  Surgeon: Laurin Coder, MD;   [facility];  Service: Endoscopy;  Laterality: N/A;     OB History   No obstetric history on file.     Family History  Problem Relation Age of Onset  . Aortic dissection Father   . Arthritis Mother   . Stroke Mother 44  . Heart murmur Other   . Colon cancer Neg Hx   . Esophageal cancer Neg Hx   . Stomach cancer Neg Hx   . Rectal cancer Neg Hx   . Colon polyps Neg Hx     Social History   Tobacco Use  . Smoking status: Former Smoker    Packs/day: 0.25    Years: 20.00    Pack years: 5.00    Types: Cigarettes    Quit date: 09/08/1978    Years since quitting: 41.3  . Smokeless tobacco: Never Used  . Tobacco comment: quit smoking 64yrs ago  Vaping Use  . Vaping Use: Never used  Substance Use Topics  . Alcohol use: No  . Drug use: No    Home Medications Prior to Admission medications   Medication Sig Start Date End Date Taking? Authorizing Provider  albuterol (VENTOLIN HFA) 108 (90 Base) MCG/ACT inhaler Inhale 2 puffs into the lungs every 4 (four) hours as needed for wheezing or shortness of breath. 04/01/19   Caren Macadam, MD  amoxicillin-clavulanate (AUGMENTIN) 875-125 MG tablet Take 1 tablet by mouth 2 (two) times daily. 12/20/19   Sherrilyn Rist A, MD  aspirin 81 MG tablet Take 81 mg by mouth daily.    [provider]  b complex vitamins tablet Take 1 tablet by mouth daily.    [provider]  beta carotene w/minerals (OCUVITE) tablet Take 1 tablet by mouth daily.    [provider]  Cholecalciferol (VITAMIN D3 PO) Take by mouth.    [provider]  cyclobenzaprine (FLEXERIL) 10 MG tablet Take 1 tablet (10 mg total) by mouth 2 (two) times daily as needed for muscle spasms. 06/03/19   Aundra Dubin, PA-C  dicyclomine (BENTYL) 10 MG capsule Take 1 capsule (10 mg total) by mouth every 8 (eight) hours as needed for spasms. 10/01/18   Armbruster, Carlota Raspberry, MD  EPINEPHrine 0.3 mg/0.3 mL IJ SOAJ injection  SMARTSIG:Injection As Directed 09/14/19   [provider]  esomeprazole (NEXIUM) 20 MG capsule Take 2 capsules (40 mg total) by mouth daily before breakfast. Patient taking differently: Take 20 mg by mouth daily before breakfast.  03/21/19   Laurey Morale, MD  fluticasone (FLONASE) 50 MCG/ACT nasal spray Place 2 sprays into both nostrils daily. Patient taking differently: Place 2 sprays into both nostrils daily as needed for allergies.  07/24/14   Lucretia Kern, DO  furosemide (LASIX) 20 MG tablet TAKE 1 TABLET (20 MG TOTAL) BY MOUTH IN THE MORNING. 12/07/19   Olalere, Ernesto Rutherford, MD  levocetirizine (XYZAL) 5  MG tablet Take 5 mg by mouth daily.  03/05/16   [provider]  levothyroxine (SYNTHROID) 88 MCG tablet Take 1 tablet (88 mcg total) by mouth daily. 07/04/19   Philemon Kingdom, MD  lisinopril (ZESTRIL) 5 MG tablet Take 1 tablet (5 mg total) by mouth daily. 10/31/19   Koberlein, Steele Berg, MD  LORazepam (ATIVAN) 0.5 MG tablet Take 1 tablet (0.5 mg total) by mouth 4 (four) times daily as needed for anxiety. 12/05/19   Donnal Moat T, PA-C  ondansetron (ZOFRAN ODT) 4 MG disintegrating tablet Take 1 tablet (4 mg total) by mouth every 6 (six) hours as needed for nausea or vomiting. 03/30/19   Caren Macadam, MD  pravastatin (PRAVACHOL) 20 MG tablet Take 1 tablet (20 mg total) by mouth daily. TAKE 1 TABLET BY MOUTH EVERY DAY 10/31/19   Koberlein, Steele Berg, MD  predniSONE (DELTASONE) 10 MG tablet Take 1 tablet (10 mg total) by mouth as directed. 11/21/19   Laurin Coder, MD  predniSONE (DELTASONE) 5 MG tablet Take 1 tablet (5 mg total) by mouth daily with breakfast. 12/04/19   Olalere, Adewale A, MD  sertraline (ZOLOFT) 100 MG tablet Take 1.5 tablets (150 mg total) by mouth daily. 12/05/19   Addison Lank, PA-C  Spacer/Aero-Holding Chambers DEVI Use as directed with inhaler 04/01/19   Koberlein, Steele Berg, MD  sulfamethoxazole-trimethoprim (BACTRIM) 400-80 MG tablet Take 1 tablet by  mouth 3 (three) times a week. 11/14/19 01/23/20  Laurin Coder, MD  zonisamide (ZONEGRAN) 100 MG capsule Take 2 capsules (200 mg total) by mouth 2 (two) times daily. Patient taking differently: Take 200 mg by mouth in the morning, at noon, and at bedtime.  10/28/19   Cameron Sprang, MD    Allergies    Lamotrigine, Bupropion, Carbamazepine, Levetiracetam, Tramadol, Adhesive [tape], Clarithromycin, Doxycycline, Nickel, Other, Topamax [topiramate], Estradiol, Latex, and Phenytoin sodium extended  Review of Systems   Review of Systems All other systems reviewed and are negative except that which was mentioned in HPI  Physical Exam Updated Vital Signs BP 125/66 (BP Location: Left Arm)   Pulse 66   Temp 98.6 F (37 C) (Oral)   Resp 18   Ht 5\' 5"  (1.651 m)   Wt 104.3 kg   SpO2 100%   BMI 38.27 kg/m   Physical Exam Vitals and nursing note reviewed.  Constitutional:      General: She is not in acute distress.    Appearance: She is well-developed.  HENT:     Head: Normocephalic and atraumatic.  Eyes:     Conjunctiva/sclera: Conjunctivae normal.  Cardiovascular:     Rate and Rhythm: Normal rate and regular rhythm.     Heart sounds: Normal heart sounds. No murmur heard.   Pulmonary:     Effort: Pulmonary effort is normal.     Comments: Faint crackles b/l bases Abdominal:     General: Bowel sounds are normal. There is no distension.     Palpations: Abdomen is soft.     Tenderness: There is no abdominal tenderness.  Musculoskeletal:     Cervical back: Neck supple.  Skin:    General: Skin is warm and dry.  Neurological:     Mental Status: She is alert and oriented to person, place, and time.     Comments: Fluent speech  Psychiatric:        Judgment: Judgment normal.     ED Results / Procedures / Treatments   Labs (all labs ordered  are listed, but only abnormal results are displayed) Labs Reviewed  BASIC METABOLIC PANEL - Abnormal; Notable for the following components:        Result Value   Glucose, Bld 108 (*)    All other components within normal limits  CBC - Abnormal; Notable for the following components:   WBC 11.3 (*)    All other components within normal limits  SARS CORONAVIRUS 2 BY RT PCR (HOSPITAL ORDER, [facility])  TROPONIN I (HIGH SENSITIVITY)  TROPONIN I (HIGH SENSITIVITY)    EKG EKG Interpretation  Date/Time:  Tuesday December 20 2019 17:03:50 EDT Ventricular Rate:  80 PR Interval:  182 QRS Duration: 64 QT Interval:  374 QTC Calculation: 431 R Axis:   -7 Text Interpretation: Normal sinus rhythm Inferior infarct , age undetermined Anterolateral infarct , age undetermined Abnormal ECG No significant change since last tracing Confirmed by Theotis Burrow [id number]) on 12/20/2019 11:11:14 PM   Radiology No results found.  Procedures Procedures (including critical care time)  Medications Ordered in ED Medications  albuterol (VENTOLIN HFA) 108 (90 Base) MCG/ACT inhaler 4 puff (has no administration in time range)    ED Course  I have reviewed the triage vital signs and the nursing notes.  Pertinent labs & imaging results that were available during my care of the patient were reviewed by me and considered in my medical decision making (see chart for details).    MDM Rules/Calculators/A&P                          Patient was comfortable and breathing normally on room air with O2 saturations 100%.  Chest x-ray results from outside facility show "While the multifocal opacities could represent scarring and atelectasis from prior infection, acute multifocal pneumonia is not excluded. Comparison to prior studies would be helpful if they exist."  EKG similar to previous, lab work shows WBC 11.3, normal hemoglobin, reassuring BMP, negative serial troponins.  COVID-19 is pending.  I have ordered CTA of chest to rule out PE given worsening dyspnea on exertion as well as to evaluate for occult pneumonia.  Patient signed  out pending results. Final Clinical Impression(s) / ED Diagnoses Final diagnoses:  None    Rx / DC Orders ED Discharge Orders    None       Beverly Ballard, Wenda Overland, MD 12/20/19 2316

## 2019-12-20 NOTE — Progress Notes (Signed)
CT pend IV

## 2019-12-20 NOTE — Telephone Encounter (Signed)
Spoke with patient regarding prior message. Let patient know that DR.Olalere sent in a script to her Pharmacy for Augmentin 875-125mg  take 1 tablet 2x's daily. Patient's voice was understanding . Nothing else further needed.

## 2019-12-20 NOTE — ED Triage Notes (Signed)
Pt reports "just not feeling myself for a few days" reports sob increasing with exertion, intermittent chest tightness with coughing, low grade temps. ekg performed at triage and shown to edp by [place].

## 2019-12-20 NOTE — Progress Notes (Signed)
Augmentin called in to be used for 7 days

## 2019-12-21 DIAGNOSIS — R0602 Shortness of breath: Secondary | ICD-10-CM | POA: Diagnosis not present

## 2019-12-21 MED ORDER — PREDNISONE 20 MG PO TABS: 40.0000 mg | ORAL_TABLET | Freq: Every day | ORAL | 0 refills | Status: DC

## 2019-12-23 DIAGNOSIS — J301 Allergic rhinitis due to pollen: Secondary | ICD-10-CM | POA: Diagnosis not present

## 2019-12-23 DIAGNOSIS — J3081 Allergic rhinitis due to animal (cat) (dog) hair and dander: Secondary | ICD-10-CM | POA: Diagnosis not present

## 2019-12-23 DIAGNOSIS — J3089 Other allergic rhinitis: Secondary | ICD-10-CM | POA: Diagnosis not present

## 2019-12-30 ENCOUNTER — Other Ambulatory Visit: Payer: Self-pay | Admitting: Pulmonary Disease

## 2019-12-30 ENCOUNTER — Encounter: Payer: Self-pay | Admitting: Pulmonary Disease

## 2019-12-30 ENCOUNTER — Ambulatory Visit: Payer: Medicare HMO | Admitting: Pulmonary Disease

## 2019-12-30 ENCOUNTER — Other Ambulatory Visit: Payer: Self-pay

## 2019-12-30 VITALS — BP 120/72 | HR 76 | Temp 97.9°F | Ht 65.0 in | Wt 248.0 lb

## 2019-12-30 DIAGNOSIS — J679 Hypersensitivity pneumonitis due to unspecified organic dust: Secondary | ICD-10-CM | POA: Diagnosis not present

## 2019-12-30 DIAGNOSIS — G4733 Obstructive sleep apnea (adult) (pediatric): Secondary | ICD-10-CM | POA: Diagnosis not present

## 2019-12-30 DIAGNOSIS — Z9989 Dependence on other enabling machines and devices: Secondary | ICD-10-CM | POA: Diagnosis not present

## 2019-12-30 NOTE — Patient Instructions (Signed)
Wean off prednisone over the next 2 weeks Go to 5 mg every other day for 2 weeks and then stop  You may discontinue Bactrim following discontinuation of prednisone  I will reach out to your primary doctor   Follow-up in 6 weeks

## 2019-12-30 NOTE — Progress Notes (Signed)
Beverly Ballard    [id number]    08-10-1945  Primary Care Physician:Koberlein, Steele Berg, MD  Referring Physician: Caren Macadam, MD [place],  [place] [address]  Chief complaint:   Follow-up of obstructive sleep apnea and hypersensitivity pneumonitis Still feels short of breath Feels she has having brain fog, cannot complete activities   HPI: Diagnosed hypersensitive pneumonitis Has been on prednisone Prednisone and doses being weaned currently on 5 mg daily  Complains of some leg swelling Recently on increased Lasix to help with leg swelling Course of Augmentin recently for a lower respiratory tract infection  Down to 5 mg prednisone  She feels she is not her usual self Not able to complete tasks Remains short of breath  Anxiolytic was just increased Dose of Zoloft was also recently increased She feels she does not have any other social pressures that may be contributing to her just feeling poorly generally  She does have a history of chronic shortness of breath Chronic back pain-on Flexeril She is a couple of back surgeries in the past Chronic knee pain  Recently had a bronchoscopy-increased neutrophils on BAL, transbronchial biopsies were mainly bronchial tissue  She is using CPAP on a regular basis -She is tolerating CPAP much better  She did smoke in the past-claims never to be a heavy smoker She does have a history of allergies for which she receives allergy shots  Pets: No pets Occupation: Worked retail Exposures: No mold exposure Smoking history: Quit smoking many years ago Travel history: Visits to [place] frequently Relevant family history: No contributory family history of lung disease  Outpatient Encounter Medications as of 12/30/2019  Medication Sig  . albuterol (VENTOLIN HFA) 108 (90 Base) MCG/ACT inhaler Inhale 2 puffs into the lungs every 4 (four) hours as needed for wheezing or shortness of breath.  Marland Kitchen aspirin 81 MG  tablet Take 81 mg by mouth daily.  Marland Kitchen b complex vitamins tablet Take 1 tablet by mouth daily.  . beta carotene w/minerals (OCUVITE) tablet Take 1 tablet by mouth daily.  . Cholecalciferol (VITAMIN D3 PO) Take by mouth.  . cyclobenzaprine (FLEXERIL) 10 MG tablet Take 1 tablet (10 mg total) by mouth 2 (two) times daily as needed for muscle spasms.  Marland Kitchen dicyclomine (BENTYL) 10 MG capsule Take 1 capsule (10 mg total) by mouth every 8 (eight) hours as needed for spasms.  Marland Kitchen EPINEPHrine 0.3 mg/0.3 mL IJ SOAJ injection SMARTSIG:Injection As Directed  . esomeprazole (NEXIUM) 20 MG capsule Take 2 capsules (40 mg total) by mouth daily before breakfast. (Patient taking differently: Take 20 mg by mouth daily before breakfast. )  . fluticasone (FLONASE) 50 MCG/ACT nasal spray Place 2 sprays into both nostrils daily. (Patient taking differently: Place 2 sprays into both nostrils daily as needed for allergies. )  . furosemide (LASIX) 20 MG tablet TAKE 1 TABLET (20 MG TOTAL) BY MOUTH IN THE MORNING.  Marland Kitchen levocetirizine (XYZAL) 5 MG tablet Take 5 mg by mouth daily.   Marland Kitchen levothyroxine (SYNTHROID) 88 MCG tablet Take 1 tablet (88 mcg total) by mouth daily.  Marland Kitchen lisinopril (ZESTRIL) 5 MG tablet Take 1 tablet (5 mg total) by mouth daily.  Marland Kitchen LORazepam (ATIVAN) 0.5 MG tablet Take 1 tablet (0.5 mg total) by mouth 4 (four) times daily as needed for anxiety.  . ondansetron (ZOFRAN ODT) 4 MG disintegrating tablet Take 1 tablet (4 mg total) by mouth every 6 (six) hours as needed for  nausea or vomiting.  . pravastatin (PRAVACHOL) 20 MG tablet Take 1 tablet (20 mg total) by mouth daily. TAKE 1 TABLET BY MOUTH EVERY DAY  . predniSONE (DELTASONE) 5 MG tablet Take 1 tablet (5 mg total) by mouth daily with breakfast.  . sertraline (ZOLOFT) 100 MG tablet Take 1.5 tablets (150 mg total) by mouth daily.  Marland Kitchen Spacer/Aero-Holding Dorise Bullion Use as directed with inhaler  . sulfamethoxazole-trimethoprim (BACTRIM) 400-80 MG tablet Take 1 tablet by  mouth 3 (three) times a week.  . zonisamide (ZONEGRAN) 100 MG capsule Take 2 capsules (200 mg total) by mouth 2 (two) times daily. (Patient taking differently: Take 200 mg by mouth in the morning, at noon, and at bedtime. )  . [DISCONTINUED] amoxicillin-clavulanate (AUGMENTIN) 875-125 MG tablet Take 1 tablet by mouth 2 (two) times daily.  . [DISCONTINUED] predniSONE (DELTASONE) 20 MG tablet Take 2 tablets (40 mg total) by mouth daily. Take 40 mg by mouth daily for 3 days, then 42m by mouth daily for 3 days, then 157mdaily for 3 days   No facility-administered encounter medications on file as of 12/30/2019.    Allergies as of 12/30/2019 - Review Complete 12/30/2019  Allergen Reaction Noted  . Lamotrigine Swelling 07/03/2015  . Bupropion Other (See Comments) 05/02/2016  . Carbamazepine Other (See Comments) 07/03/2015  . Levetiracetam  07/03/2015  . Tramadol Other (See Comments) 05/02/2016  . Adhesive [tape]  09/08/2014  . Clarithromycin    . Doxycycline    . Nickel  08/11/2014  . Other  08/11/2014  . Topamax [topiramate] Nausea And Vomiting 12/05/2019  . Estradiol Rash 07/03/2015  . Latex Rash 01/28/2018  . Phenytoin sodium extended Other (See Comments) 07/03/2015    Past Medical History:  Diagnosis Date  . Allergy   . Anxiety and depression 12/19/2009  . Arthritis   . Cancer (HCC)    vaginal  . Constipation   . Degenerative disorder of eye   . Diverticulosis   . Essential hypertension, benign 06/26/2009  . GERD 05/27/2007   takes Nexium daily  . Heart murmur    yrs ago  . History of gout   . History of migraine many yrs ago  . Hyperlipidemia    takes Pravastatin daily  . Hypothyroidism   . Insomnia    but doesn't take any meds  . LOW BACK PAIN 05/27/2007  . Numbness    left toes   . Osteoporosis   . Seizures ([facility]   last 1983, none since then- last seizure 1988 per pt     Past Surgical History:  Procedure Laterality Date  . ADENOIDECTOMY    . BACK SURGERY      lumbar x2  . BRONCHIAL BIOPSY  09/12/2019   Procedure: BRONCHIAL BIOPSIES;  Surgeon: OlLaurin CoderMD;  Location: [facility];  Service: Endoscopy;;  . BRONCHIAL WASHINGS  09/12/2019   Procedure: BRONCHIAL WASHINGS;  Surgeon: OlLaurin CoderMD;  Location: [facility];  Service: Endoscopy;;  . COLONOSCOPY  2009  . ESOPHAGOGASTRODUODENOSCOPY    . Heel spurs Bilateral   . HEMOSTASIS CONTROL  09/12/2019   Procedure: HEMOSTASIS CONTROL;  Surgeon: OlLaurin CoderMD;  Location: [facility];  Service: Endoscopy;;  epi  . KNEE ARTHROPLASTY Left 11/07/2013   Procedure: COMPUTER ASSISTED TOTAL KNEE ARTHROPLASTY;  Surgeon: MaMarybelle KillingsMD;  [facility] Service: [facility]ics;  Laterality: Left;  Left Total Knee Arthroplasty, Cemented, Computer Assist  . TONSILLECTOMY    . TOTAL KNEE REVISION  Left 09/19/2014   Procedure: LEFT TOTAL KNEE REVISION;  Surgeon: Leandrew Koyanagi, MD;  Location: [facility];  Service: Orthopedics;  Laterality: Left;  . UPPER GASTROINTESTINAL ENDOSCOPY    . VIDEO BRONCHOSCOPY N/A 09/12/2019   Procedure: VIDEO BRONCHOSCOPY WITH FLUORO;  Surgeon: Laurin Coder, MD;  Location: [facility];  Service: Endoscopy;  Laterality: N/A;    Family History  Problem Relation Age of Onset  . Aortic dissection Father   . Arthritis Mother   . Stroke Mother 44  . Heart murmur Other   . Colon cancer Neg Hx   . Esophageal cancer Neg Hx   . Stomach cancer Neg Hx   . Rectal cancer Neg Hx   . Colon polyps Neg Hx     Social History   Socioeconomic History  . Marital status: Married    Spouse name: Not on file  . Number of children: 1  . Years of education: Not on file  . Highest education level: Not on file  Occupational History  . Occupation: Retired  Tobacco Use  . Smoking status: Former Smoker    Packs/day: 0.25    Years: 20.00    Pack years: 5.00    Types: Cigarettes    Quit date: 09/08/1978    Years since quitting: 41.3  . Smokeless tobacco: Never Used  .  Tobacco comment: quit smoking 57yr ago  Vaping Use  . Vaping Use: Never used  Substance and Sexual Activity  . Alcohol use: No  . Drug use: No  . Sexual activity: Never    Birth control/protection: Post-menopausal  Other Topics Concern  . Not on file  Social History Narrative   Right handed    Lives with husband    Social Determinants of Health   Financial Resource Strain:   . Difficulty of Paying Living Expenses: Not on file  Food Insecurity:   . Worried About RCharity fundraiserin the Last Year: Not on file  . Ran Out of Food in the Last Year: Not on file  Transportation Needs:   . Lack of Transportation (Medical): Not on file  . Lack of Transportation (Non-Medical): Not on file  Physical Activity:   . Days of Exercise per Week: Not on file  . Minutes of Exercise per Session: Not on file  Stress:   . Feeling of Stress : Not on file  Social Connections:   . Frequency of Communication with Friends and Family: Not on file  . Frequency of Social Gatherings with Friends and Family: Not on file  . Attends Religious Services: Not on file  . Active Member of Clubs or Organizations: Not on file  . Attends CArchivistMeetings: Not on file  . Marital Status: Not on file  Intimate Partner Violence:   . Fear of Current or Ex-Partner: Not on file  . Emotionally Abused: Not on file  . Physically Abused: Not on file  . Sexually Abused: Not on file    Review of Systems  Constitutional: Positive for fatigue.  HENT: Negative for postnasal drip and rhinorrhea.   Eyes: Negative.   Respiratory: Positive for apnea and shortness of breath. Negative for cough.   Cardiovascular: Negative.   Gastrointestinal: Negative.   Endocrine: Negative.   Genitourinary: Negative.   Musculoskeletal: Positive for arthralgias and back pain.       Knee pain  Allergic/Immunologic: Positive for environmental allergies.  Neurological: Negative.   Hematological: Negative.     Psychiatric/Behavioral: Positive for dysphoric  mood and sleep disturbance.  All other systems reviewed and are negative.   Vitals:   12/30/19 1324  BP: 120/72  Pulse: 76  Temp: 97.9 F (36.6 C)  SpO2: 97%     Physical Exam Constitutional:      General: She is not in acute distress.    Appearance: She is well-developed. She is not diaphoretic.  Eyes:     General:        Right eye: No discharge.        Left eye: No discharge.  Neck:     Thyroid: No thyromegaly.     Trachea: No tracheal deviation.  Cardiovascular:     Rate and Rhythm: Normal rate and regular rhythm.  Pulmonary:     Effort: Pulmonary effort is normal. No respiratory distress.     Breath sounds: No wheezing or rhonchi.  Musculoskeletal:     Right lower leg: Edema present.     Left lower leg: Edema present.  Neurological:     General: No focal deficit present.   Data reviewed:  Compliance data shows 93% compliance Average use of 4 hours 4 minutes Residual AHI 1.8  Recent echocardiogram reviewed showing diastolic dysfunction, normal ejection fraction 10/18/2019  Recent CT scan of the chest reviewed-12/21/2019-scarring upper lobes unchanged-reviewed by myself  Assessment:  Chronic shortness of breath-multifactorial -Multifactorial -Remains about the same despite steroids specified bronchodilators -Musculoskeletal pain and discomfort may also be contributing  Chronic hypersensitivity pneumonitis -Steroids initiated at 30 mg, currently on 20 mg daily -She will be cutting down to 10 mg at the end of the month -Steroids is being weaned down currently on 5 mg daily -With increasing anxiety and just feeling generally unwell we agreed on weaning off prednisone completely -She will go to 5 mg alternate days for 2 weeks and then stop -She may stop Bactrim prophylaxis following discontinuation of steroids  Significant back pain and knee pain  -This is chronic  Leg swelling is new -Lasix for leg swelling  did help  Previous smoker, significant exposure to secondhand smoke in the past-no underlying lung disease known -PFT does not reveal any significant obstruction  History of allergies for which she receives allergy shots  Obstructive sleep apnea -Compliant with CPAP use  Plan/Recommendations:  Continue to use albuterol as needed  Continue CPAP use  Wean off steroids as tolerated  Regular exercises encouraged, graded exercises  Shortness of breath is multifactorial  Use of vitamin D and calcium discussed  I will reach out to her primary doctor to discuss ongoing care-I am not certain that all her ongoing symptoms is all related to having hypersensitivity pneumonitis-radiologically stable, did not notice any significant improvement in symptoms with steroids-weaning off steroids may help   Sherrilyn Rist MD [facility] 12/30/2019, 1:36 PM  CC: Caren Macadam, MD

## 2019-12-31 DIAGNOSIS — G4733 Obstructive sleep apnea (adult) (pediatric): Secondary | ICD-10-CM | POA: Diagnosis not present

## 2020-01-02 ENCOUNTER — Encounter: Payer: Self-pay | Admitting: Physician Assistant

## 2020-01-02 ENCOUNTER — Other Ambulatory Visit: Payer: Self-pay

## 2020-01-02 ENCOUNTER — Ambulatory Visit (INDEPENDENT_AMBULATORY_CARE_PROVIDER_SITE_OTHER): Payer: Medicare HMO | Admitting: Physician Assistant

## 2020-01-02 DIAGNOSIS — G4700 Insomnia, unspecified: Secondary | ICD-10-CM

## 2020-01-02 DIAGNOSIS — G4733 Obstructive sleep apnea (adult) (pediatric): Secondary | ICD-10-CM | POA: Diagnosis not present

## 2020-01-02 DIAGNOSIS — F411 Generalized anxiety disorder: Secondary | ICD-10-CM

## 2020-01-02 DIAGNOSIS — F331 Major depressive disorder, recurrent, moderate: Secondary | ICD-10-CM

## 2020-01-02 DIAGNOSIS — R69 Illness, unspecified: Secondary | ICD-10-CM | POA: Diagnosis not present

## 2020-01-02 MED ORDER — SERTRALINE HCL 100 MG PO TABS: 200.0000 mg | ORAL_TABLET | Freq: Every day | ORAL | 0 refills | Status: DC

## 2020-01-02 NOTE — Progress Notes (Signed)
[facility] Med Check  Patient ID: Beverly Ballard,  MRN: [id number]  PCP: Caren Macadam, MD  Date of Evaluation: 01/02/2020 Time spent:20 minutes  Chief Complaint:  Chief Complaint    Follow-up      HISTORY/CURRENT STATUS: HPI For routine med check.   Had an ER visit since LOV. "I guess I was just having a panic attack." Hasn't seen any improvement with the Zoloft increase, with either the anxiety or depression.  Has only taken the Ativan once. "I'm afraid I'll get hooked."  When she does get anxious now, it is mostly a generalized feeling that something bad is going to happen.  "I know I'm depressed."  She has a hard time enjoying things.  Energy and motivation are low.  She prefers to be home by herself, but does not want to do any crafts which is something she has always enjoyed.  Everything that is going on in the world right now from the pandemic to our political issues here in the [place], are really bothersome to her.  "I have had to stop watching the news.  It irritates me so much."  Sleeps well most of the time, she is diligent about using the CPAP.  No suicidal or homicidal thoughts.  Patient denies increased energy with decreased need for sleep, no increased talkativeness, no racing thoughts, no impulsivity or risky behaviors, no increased spending, no increased libido, no grandiosity, no increased irritability or anger, no paranoia,and no hallucinations.  Denies dizziness, syncope, seizures, numbness, tingling, tremor, tics, unsteady gait, slurred speech, confusion. Denies muscle or joint pain, stiffness, or dystonia.  Individual Medical History/ Review of Systems: Changes? :No    Past medications for mental health diagnoses include: Paxil, Prozac, Valium  Allergies: Lamotrigine, Bupropion, Carbamazepine, Levetiracetam, Tramadol, Adhesive [tape], Clarithromycin, Doxycycline, Nickel, Other, Topamax [topiramate], Estradiol, Latex, and Phenytoin sodium extended  Current  Medications:  Current Outpatient Medications:    albuterol (VENTOLIN HFA) 108 (90 Base) MCG/ACT inhaler, Inhale 2 puffs into the lungs every 4 (four) hours as needed for wheezing or shortness of breath., Disp: 18 g, Rfl: 0   aspirin 81 MG tablet, Take 81 mg by mouth daily., Disp: , Rfl:    b complex vitamins tablet, Take 1 tablet by mouth daily., Disp: , Rfl:    beta carotene w/minerals (OCUVITE) tablet, Take 1 tablet by mouth daily., Disp: , Rfl:    Cholecalciferol (VITAMIN D3 PO), Take by mouth., Disp: , Rfl:    cyclobenzaprine (FLEXERIL) 10 MG tablet, Take 1 tablet (10 mg total) by mouth 2 (two) times daily as needed for muscle spasms., Disp: 30 tablet, Rfl: 0   EPINEPHrine 0.3 mg/0.3 mL IJ SOAJ injection, SMARTSIG:Injection As Directed, Disp: , Rfl:    esomeprazole (NEXIUM) 20 MG capsule, Take 2 capsules (40 mg total) by mouth daily before breakfast. (Patient taking differently: Take 20 mg by mouth daily before breakfast. ), Disp: 2 capsule, Rfl: 0   fluticasone (FLONASE) 50 MCG/ACT nasal spray, Place 2 sprays into both nostrils daily. (Patient taking differently: Place 2 sprays into both nostrils daily as needed for allergies. ), Disp: 16 g, Rfl: 6   furosemide (LASIX) 20 MG tablet, TAKE 1 TABLET (20 MG TOTAL) BY MOUTH IN THE MORNING., Disp: 30 tablet, Rfl: 1   levocetirizine (XYZAL) 5 MG tablet, Take 5 mg by mouth daily. , Disp: , Rfl:    levothyroxine (SYNTHROID) 88 MCG tablet, Take 1 tablet (88 mcg total) by mouth daily., Disp: 90 tablet, Rfl: 3  lisinopril (ZESTRIL) 5 MG tablet, Take 1 tablet (5 mg total) by mouth daily., Disp: 90 tablet, Rfl: 1   LORazepam (ATIVAN) 0.5 MG tablet, Take 1 tablet (0.5 mg total) by mouth 4 (four) times daily as needed for anxiety., Disp: 60 tablet, Rfl: 0   ondansetron (ZOFRAN ODT) 4 MG disintegrating tablet, Take 1 tablet (4 mg total) by mouth every 6 (six) hours as needed for nausea or vomiting., Disp: 30 tablet, Rfl: 3   pravastatin  (PRAVACHOL) 20 MG tablet, Take 1 tablet (20 mg total) by mouth daily. TAKE 1 TABLET BY MOUTH EVERY DAY, Disp: 90 tablet, Rfl: 1   predniSONE (DELTASONE) 5 MG tablet, Take 1 tablet (5 mg total) by mouth daily with breakfast. (Patient taking differently: Take 5 mg by mouth every other day. ), Disp: 30 tablet, Rfl: 1   sertraline (ZOLOFT) 100 MG tablet, Take 2 tablets (200 mg total) by mouth daily., Disp: 180 tablet, Rfl: 0   Spacer/Aero-Holding Chambers DEVI, Use as directed with inhaler, Disp: 1 each, Rfl: 0   sulfamethoxazole-trimethoprim (BACTRIM) 400-80 MG tablet, Take 1 tablet by mouth 3 (three) times a week., Disp: 30 tablet, Rfl: 0   zonisamide (ZONEGRAN) 100 MG capsule, Take 2 capsules (200 mg total) by mouth 2 (two) times daily. (Patient taking differently: Take 200 mg by mouth in the morning, at noon, and at bedtime. ), Disp: 120 capsule, Rfl: 4   dicyclomine (BENTYL) 10 MG capsule, Take 1 capsule (10 mg total) by mouth every 8 (eight) hours as needed for spasms. (Patient not taking: Reported on 01/02/2020), Disp: 30 capsule, Rfl: 3 Medication Side Effects: none  Family Medical/ Social History: Changes? No  MENTAL HEALTH EXAM:  There were no vitals taken for this visit.There is no height or weight on file to calculate BMI.  General Appearance: Casual, Neat, Well Groomed and Obese  Eye Contact:  Good  Speech:  Clear and Coherent  Volume:  Normal  Mood:  Depressed  Affect:  Depressed  Thought Process:  Goal Directed  Orientation:  Full (Time, Place, and Person)  Thought Content: Logical   Suicidal Thoughts:  No  Homicidal Thoughts:  No  Memory:  WNL  Judgement:  Good  Insight:  Good  Psychomotor Activity:  Normal  Concentration:  Concentration: Fair and Attention Span: Fair  Recall:  Good  Fund of Knowledge: Good  Language: Good  Assets:  Desire for Improvement  ADL's:  Intact  Cognition: WNL  Prognosis:  Good    DIAGNOSES:    ICD-10-CM   1. Major depressive  disorder, recurrent episode, moderate (HCC)  F33.1   2. Obstructive sleep apnea  G47.33   3. Generalized anxiety disorder  F41.1   4. Insomnia, unspecified type  G47.00     Receiving Psychotherapy: Yes With Dr. Glennon Hamilton   RECOMMENDATIONS:  PDMP reviewed. I provided 20 minutes of face to face time during this encounter. Recommend increasing the Zoloft to help with anxiety as well as depression.  She is in agreement. I have encouraged her to take the Ativan whenever needed.  At this low of a dose and as rarely as she would take it, there really is no risk of her becoming dependent on it. Increase Zoloft 100 mg to 2 p.o. daily. Continue Ativan 0.5 mg, 1 up to 4 times a day as needed. Continue use of CPAP. Continue therapy.   Return in 4 to 6 Weeks.  Donnal Moat, PA-C

## 2020-01-06 DIAGNOSIS — J3089 Other allergic rhinitis: Secondary | ICD-10-CM | POA: Diagnosis not present

## 2020-01-06 DIAGNOSIS — J3081 Allergic rhinitis due to animal (cat) (dog) hair and dander: Secondary | ICD-10-CM | POA: Diagnosis not present

## 2020-01-06 DIAGNOSIS — J301 Allergic rhinitis due to pollen: Secondary | ICD-10-CM | POA: Diagnosis not present

## 2020-01-11 ENCOUNTER — Ambulatory Visit (INDEPENDENT_AMBULATORY_CARE_PROVIDER_SITE_OTHER): Payer: Medicare HMO | Admitting: *Deleted

## 2020-01-11 ENCOUNTER — Other Ambulatory Visit: Payer: Self-pay

## 2020-01-11 DIAGNOSIS — J3081 Allergic rhinitis due to animal (cat) (dog) hair and dander: Secondary | ICD-10-CM | POA: Diagnosis not present

## 2020-01-11 DIAGNOSIS — J3089 Other allergic rhinitis: Secondary | ICD-10-CM | POA: Diagnosis not present

## 2020-01-11 DIAGNOSIS — Z23 Encounter for immunization: Secondary | ICD-10-CM | POA: Diagnosis not present

## 2020-01-11 DIAGNOSIS — J301 Allergic rhinitis due to pollen: Secondary | ICD-10-CM | POA: Diagnosis not present

## 2020-01-17 ENCOUNTER — Other Ambulatory Visit: Payer: Self-pay

## 2020-01-17 ENCOUNTER — Ambulatory Visit (INDEPENDENT_AMBULATORY_CARE_PROVIDER_SITE_OTHER): Payer: Medicare HMO

## 2020-01-17 DIAGNOSIS — Z0000 Encounter for general adult medical examination without abnormal findings: Secondary | ICD-10-CM | POA: Diagnosis not present

## 2020-01-17 NOTE — Progress Notes (Signed)
Virtual Visit via Telephone Note  I connected with  Beverly Ballard on 01/17/20 at  1:00 PM EDT by telephone and verified that I am speaking with the correct person using two identifiers.  Medicare Annual Wellness visit completed telephonically due to Covid-19 pandemic.   Persons participating in this call: This Health Coach and this patient.   Location: Patient: Home Provider: Office   I discussed the limitations, risks, security and privacy concerns of performing an evaluation and management service by telephone and the availability of in person appointments. The patient expressed understanding and agreed to proceed.  Unable to perform video visit due to video visit attempted and failed and/or patient does not have video capability.   Some vital signs may be absent or patient reported.   Willette Brace, LPN    Subjective:   Beverly Ballard is a 74 y.o. female who presents for Medicare Annual (Subsequent) preventive examination.  Review of Systems     Cardiac Risk Factors include: advanced age (>48men, >53 women);sedentary lifestyle;obesity (BMI >30kg/m2)     Objective:    There were no vitals filed for this visit. There is no height or weight on file to calculate BMI.  Advanced Directives 01/17/2020 12/20/2019 10/28/2019 09/12/2019 11/12/2017 03/05/2017 11/07/2016  Does Patient Have a Medical Advance Directive? Yes No Yes Yes Yes Yes Yes  Type of Paramedic of [place];Living will - Living will Living will - Living will;Healthcare Power of [place];Living will  Does patient want to make changes to medical advance directive? - - - - - - -  Copy of [place] in Chart? No - copy requested - - - - No - copy requested No - copy requested  Would patient like information on creating a medical advance directive? - No - Patient declined - - - - -    Current Medications (verified) Outpatient Encounter Medications as of  01/17/2020  Medication Sig  . albuterol (VENTOLIN HFA) 108 (90 Base) MCG/ACT inhaler Inhale 2 puffs into the lungs every 4 (four) hours as needed for wheezing or shortness of breath.  Marland Kitchen aspirin 81 MG tablet Take 81 mg by mouth daily.  Marland Kitchen b complex vitamins tablet Take 1 tablet by mouth daily.  . beta carotene w/minerals (OCUVITE) tablet Take 1 tablet by mouth daily.  . Cholecalciferol (VITAMIN D3 PO) Take by mouth.  . cyclobenzaprine (FLEXERIL) 10 MG tablet Take 1 tablet (10 mg total) by mouth 2 (two) times daily as needed for muscle spasms.  Marland Kitchen EPINEPHrine 0.3 mg/0.3 mL IJ SOAJ injection SMARTSIG:Injection As Directed  . esomeprazole (NEXIUM) 20 MG capsule Take 2 capsules (40 mg total) by mouth daily before breakfast. (Patient taking differently: Take 20 mg by mouth daily before breakfast. )  . fluticasone (FLONASE) 50 MCG/ACT nasal spray Place 2 sprays into both nostrils daily. (Patient taking differently: Place 2 sprays into both nostrils daily as needed for allergies. )  . furosemide (LASIX) 20 MG tablet TAKE 1 TABLET (20 MG TOTAL) BY MOUTH IN THE MORNING.  Marland Kitchen levocetirizine (XYZAL) 5 MG tablet Take 5 mg by mouth daily.   Marland Kitchen levothyroxine (SYNTHROID) 88 MCG tablet Take 1 tablet (88 mcg total) by mouth daily.  Marland Kitchen lisinopril (ZESTRIL) 5 MG tablet Take 1 tablet (5 mg total) by mouth daily.  Marland Kitchen LORazepam (ATIVAN) 0.5 MG tablet Take 1 tablet (0.5 mg total) by mouth 4 (four) times daily as needed for anxiety.  . ondansetron (ZOFRAN ODT)  4 MG disintegrating tablet Take 1 tablet (4 mg total) by mouth every 6 (six) hours as needed for nausea or vomiting.  . pravastatin (PRAVACHOL) 20 MG tablet Take 1 tablet (20 mg total) by mouth daily. TAKE 1 TABLET BY MOUTH EVERY DAY  . sertraline (ZOLOFT) 100 MG tablet Take 2 tablets (200 mg total) by mouth daily.  Marland Kitchen Spacer/Aero-Holding Dorise Bullion Use as directed with inhaler  . zonisamide (ZONEGRAN) 100 MG capsule Take 2 capsules (200 mg total) by mouth 2 (two) times  daily. (Patient taking differently: Take 200 mg by mouth in the morning, at noon, and at bedtime. )  . [DISCONTINUED] dicyclomine (BENTYL) 10 MG capsule Take 1 capsule (10 mg total) by mouth every 8 (eight) hours as needed for spasms. (Patient not taking: Reported on 01/02/2020)  . [DISCONTINUED] predniSONE (DELTASONE) 5 MG tablet Take 1 tablet (5 mg total) by mouth daily with breakfast. (Patient not taking: Reported on 01/17/2020)  . [DISCONTINUED] sulfamethoxazole-trimethoprim (BACTRIM) 400-80 MG tablet Take 1 tablet by mouth 3 (three) times a week. (Patient not taking: Reported on 01/17/2020)   No facility-administered encounter medications on file as of 01/17/2020.    Allergies (verified) Lamotrigine, Bupropion, Carbamazepine, Levetiracetam, Tramadol, Adhesive [tape], Clarithromycin, Doxycycline, Nickel, Other, Topamax [topiramate], Estradiol, Latex, and Phenytoin sodium extended   History: Past Medical History:  Diagnosis Date  . Allergy   . Anxiety and depression 12/19/2009  . Arthritis   . Cancer (HCC)    vaginal  . Constipation   . Degenerative disorder of eye   . Diverticulosis   . Essential hypertension, benign 06/26/2009  . GERD 05/27/2007   takes Nexium daily  . Heart murmur    yrs ago  . History of gout   . History of migraine many yrs ago  . Hyperlipidemia    takes Pravastatin daily  . Hypothyroidism   . Insomnia    but doesn't take any meds  . LOW BACK PAIN 05/27/2007  . Numbness    left toes   . Osteoporosis   . Seizures ([place])    last 1983, none since then- last seizure 1988 per pt    Past Surgical History:  Procedure Laterality Date  . ADENOIDECTOMY    . BACK SURGERY     lumbar x2  . BRONCHIAL BIOPSY  09/12/2019   Procedure: BRONCHIAL BIOPSIES;  Surgeon: Laurin Coder, MD;  Location: [facility];  Service: Endoscopy;;  . BRONCHIAL WASHINGS  09/12/2019   Procedure: BRONCHIAL WASHINGS;  Surgeon: Laurin Coder, MD;  Location: [facility];  Service:  Endoscopy;;  . COLONOSCOPY  2009  . ESOPHAGOGASTRODUODENOSCOPY    . Heel spurs Bilateral   . HEMOSTASIS CONTROL  09/12/2019   Procedure: HEMOSTASIS CONTROL;  Surgeon: Laurin Coder, MD;  Location: [facility];  Service: Endoscopy;;  epi  . KNEE ARTHROPLASTY Left 11/07/2013   Procedure: COMPUTER ASSISTED TOTAL KNEE ARTHROPLASTY;  Surgeon: Marybelle Killings, MD;  Location: [place];  Service: Orthopedics;  Laterality: Left;  Left Total Knee Arthroplasty, Cemented, Computer Assist  . TONSILLECTOMY    . TOTAL KNEE REVISION Left 09/19/2014   Procedure: LEFT TOTAL KNEE REVISION;  Surgeon: Leandrew Koyanagi, MD;  Location: [place];  Service: Orthopedics;  Laterality: Left;  . UPPER GASTROINTESTINAL ENDOSCOPY    . VIDEO BRONCHOSCOPY N/A 09/12/2019   Procedure: VIDEO BRONCHOSCOPY WITH FLUORO;  Surgeon: Laurin Coder, MD;  Location: [facility];  Service: Endoscopy;  Laterality: N/A;   Family History  Problem Relation Age of  Onset  . Aortic dissection Father   . Arthritis Mother   . Stroke Mother 57  . Heart murmur Other   . Colon cancer Neg Hx   . Esophageal cancer Neg Hx   . Stomach cancer Neg Hx   . Rectal cancer Neg Hx   . Colon polyps Neg Hx    Social History   Socioeconomic History  . Marital status: Married    Spouse name: Not on file  . Number of children: 1  . Years of education: Not on file  . Highest education level: Not on file  Occupational History  . Occupation: Retired  Tobacco Use  . Smoking status: Former Smoker    Packs/day: 0.25    Years: 20.00    Pack years: 5.00    Types: Cigarettes    Quit date: 09/08/1978    Years since quitting: 41.3  . Smokeless tobacco: Never Used  . Tobacco comment: quit smoking 20yrs ago  Vaping Use  . Vaping Use: Never used  Substance and Sexual Activity  . Alcohol use: No  . Drug use: No  . Sexual activity: Never    Birth control/protection: Post-menopausal  Other Topics Concern  . Not on file  Social History Narrative   Right  handed    Lives with husband    Social Determinants of Health   Financial Resource Strain: Low Risk   . Difficulty of Paying Living Expenses: Not hard at all  Food Insecurity: No Food Insecurity  . Worried About Charity fundraiser in the Last Year: Never true  . Ran Out of Food in the Last Year: Never true  Transportation Needs: No Transportation Needs  . Lack of Transportation (Medical): No  . Lack of Transportation (Non-Medical): No  Physical Activity: Inactive  . Days of Exercise per Week: 0 days  . Minutes of Exercise per Session: 0 min  Stress: Stress Concern Present  . Feeling of Stress : To some extent  Social Connections: Socially Isolated  . Frequency of Communication with Friends and Family: Twice a week  . Frequency of Social Gatherings with Friends and Family: Never  . Attends Religious Services: Never  . Active Member of Clubs or Organizations: No  . Attends Archivist Meetings: Never  . Marital Status: Married    Tobacco Counseling Counseling given: Not Answered Comment: quit smoking 57yrs ago   Clinical Intake:  Pre-visit preparation completed: Yes  Pain : No/denies pain     BMI - recorded: 41.27 Nutritional Status: BMI > 30  Obese Nutritional Risks: Nausea/ vomitting/ diarrhea (nausea at times) Diabetes: No  How often do you need to have someone help you when you read instructions, pamphlets, or other written materials from your doctor or pharmacy?: 1 - Never  Diabetic?No  Interpreter Needed?: No  Information entered by :: Charlott Rakes, LPN   Activities of Daily Living In your present state of health, do you have any difficulty performing the following activities: 01/17/2020  Hearing? N  Vision? N  Difficulty concentrating or making decisions? Y  Comment at times  Walking or climbing stairs? Y  Doing errands, shopping? N  Preparing Food and eating ? N  Using the Toilet? N  In the past six months, have you accidently leaked  urine? N  Do you have problems with loss of bowel control? N  Managing your Medications? N  Managing your Finances? N  Housekeeping or managing your Housekeeping? N  Some recent data might be hidden  Patient Care Team: Caren Macadam, MD as PCP - General (Family Medicine) Minus Breeding, MD as Consulting Physician (Cardiology) Azucena Fallen, MD as Consulting Physician (Obstetrics and Gynecology) Kirtland Bouchard, PhD as Consulting Physician (Psychology) Donnal Moat (Psychiatry) Armbruster, Carlota Raspberry, MD as Consulting Physician (Gastroenterology) Kerin Perna., MD as Referring Physician (Neurology) Cameron Sprang, MD as Consulting Physician (Neurology)  Indicate any recent Medical Services you may have received from other than [facility] providers in the past year (date may be approximate).     Assessment:   This is a routine wellness examination for Anjulie.  Hearing/Vision screen  Hearing Screening   125Hz  250Hz  500Hz  1000Hz  2000Hz  3000Hz  4000Hz  6000Hz  8000Hz   Right ear:           Left ear:           Comments: Pt denies any difficulty hearing at this time  Vision Screening Comments: Pt follows up with eye exam at [facility] with Dr Jodene Nam  Dietary issues and exercise activities discussed: Current Exercise Habits: The patient does not participate in regular exercise at present  Goals    .  Exercise 150 min/wk Moderate Activity      Will try chair yoga !    .  patient (pt-stated)      Increase activity and be more active with volunteering.     .  Patient Stated      To feel better with back and surgery     .  Patient Stated      Get healthier       Depression Screen PHQ 2/9 Scores 01/17/2020 07/08/2019 06/17/2018 03/15/2018 11/12/2017 11/09/2017 07/06/2017  PHQ - 2 Score 2 0 0 0 0 0 0  PHQ- 9 Score 3 4 3 3  - 0 3    Fall Risk Fall Risk  01/17/2020 10/28/2019 07/08/2019 11/12/2017 11/07/2016  Falls in the past year? 1 1 0 No No  Number falls in past yr: 1 0 0 - -    Injury with Fall? 1 1 0 - -  Comment bruised both knees - - - -  Risk for fall due to : History of fall(s);Impaired balance/gait;Impaired vision - - - -  Follow up Falls prevention discussed - - - -    Any stairs in or around the home? Yes  If so, are there any without handrails? No  Home free of loose throw rugs in walkways, pet beds, electrical cords, etc? Yes  Adequate lighting in your home to reduce risk of falls? Yes   ASSISTIVE DEVICES UTILIZED TO PREVENT FALLS:  Life alert? No  Use of a cane, walker or w/c? No  Grab bars in the bathroom? Yes  Shower chair or bench in shower? Yes  Elevated toilet seat or a handicapped toilet? Yes   TIMED UP AND GO:  Was the test performed? No .    Cognitive Function: MMSE - Mini Mental State Exam 11/12/2017 11/12/2017  Not completed: (No Data) (No Data)     6CIT Screen 01/17/2020  What Year? 0 points  What month? 0 points  Count back from 20 0 points  Months in reverse 2 points  Repeat phrase 0 points    Immunizations Immunization History  Administered Date(s) Administered  . Fluad Quad(high Dose 65+) 01/27/2019, 01/11/2020  . Influenza Whole 01/19/2009, 12/19/2009  . Influenza, High Dose Seasonal PF 01/08/2015, 01/03/2016, 01/01/2017, 01/28/2018  . Influenza,inj,Quad PF,6+ Mos 01/24/2014  . Influenza-Unspecified 01/06/2012  . PFIZER SARS-COV-2 Vaccination 07/02/2019, 07/23/2019  .  Pneumococcal Conjugate-13 09/19/2009  . Pneumococcal Polysaccharide-23 06/20/2013  . Tdap 05/30/2014  . Varicella 09/23/2010    TDAP status: Up to date Flu Vaccine status: Up to date Pneumococcal vaccine status: Up to date Covid-19 vaccine status: Completed vaccines  Qualifies for Shingles Vaccine? Yes   Zostavax completed No   Shingrix Completed?: No.    Education has been provided regarding the importance of this vaccine. Patient has been advised to call insurance company to determine out of pocket expense if they have not yet received this  vaccine. Advised may also receive vaccine at local pharmacy or Health Dept. Verbalized acceptance and understanding.  Screening Tests Health Maintenance  Topic Date Due  . MAMMOGRAM  03/30/2020  . TETANUS/TDAP  05/30/2024  . COLONOSCOPY  03/06/2027  . INFLUENZA VACCINE  Completed  . DEXA SCAN  Completed  . COVID-19 Vaccine  Completed  . Hepatitis C Screening  Completed  . PNA vac Low Risk Adult  Completed    Health Maintenance  There are no preventive care reminders to display for this patient.  Colorectal cancer screening: Completed 03/05/17. Repeat every 10 years Mammogram status: Completed 05/31/19. Repeat every year Bone Density status: Completed 04/04/16. Results reflect: Bone density results: OSTEOPOROSIS. Repeat every 2 years.   Additional Screening:  Hepatitis C Screening: Completed 11/01/15  Vision Screening: Recommended annual ophthalmology exams for early detection of glaucoma and other disorders of the eye. Is the patient up to date with their annual eye exam?  Yes  Who is the provider or what is the name of the office in which the patient attends annual eye exams? Dr Jodene Nam at [place]: Recommended annual dental exams for proper oral hygiene  Community Resource Referral / Chronic Care Management: CRR required this visit?  No   CCM required this visit?  No      Plan:     I have personally reviewed and noted the following in the patient's chart:   . Medical and social history . Use of alcohol, tobacco or illicit drugs  . Current medications and supplements . Functional ability and status . Nutritional status . Physical activity . Advanced directives . List of other physicians . Hospitalizations, surgeries, and ER visits in previous 12 months . Vitals . Screenings to include cognitive, depression, and falls . Referrals and appointments  In addition, I have reviewed and discussed with patient certain preventive protocols,  quality metrics, and best practice recommendations. A written personalized care plan for preventive services as well as general preventive health recommendations were provided to patient.     Willette Brace, LPN   [id number]/[id number]   Nurse Notes: None

## 2020-01-17 NOTE — Patient Instructions (Addendum)
Beverly Ballard , Thank you for taking time to come for your Medicare Wellness Visit. I appreciate your ongoing commitment to your health goals. Please review the following plan we discussed and let me know if I can assist you in the future.   Screening recommendations/referrals: Colonoscopy: Done 03/05/17 Mammogram: Done 05/31/19  Bone Density: Done 04/04/16 Recommended yearly ophthalmology/optometry visit for glaucoma screening and checkup Recommended yearly dental visit for hygiene and checkup  Vaccinations: Influenza vaccine: Up to date Pneumococcal vaccine: Up to date Tdap vaccine: Up to date Shingles vaccine: Shingrix discussed. Please contact your pharmacy for coverage information.    Covid-19:Completed 3/13 & 07/23/19  Advanced directives: Please bring a copy of your health care power of attorney and living will to the office at your convenience.  Conditions/risks identified: Get healther  Next appointment: Follow up in one year for your annual wellness visit    Preventive Care 65 Years and Older, Female Preventive care refers to lifestyle choices and visits with your health care provider that can promote health and wellness. What does preventive care include?  A yearly physical exam. This is also called an annual well check.  Dental exams once or twice a year.  Routine eye exams. Ask your health care provider how often you should have your eyes checked.  Personal lifestyle choices, including:  Daily care of your teeth and gums.  Regular physical activity.  Eating a healthy diet.  Avoiding tobacco and drug use.  Limiting alcohol use.  Practicing safe sex.  Taking low-dose aspirin every day.  Taking vitamin and mineral supplements as recommended by your health care provider. What happens during an annual well check? The services and screenings done by your health care provider during your annual well check will depend on your age, overall health, lifestyle risk  factors, and family history of disease. Counseling  Your health care provider may ask you questions about your:  Alcohol use.  Tobacco use.  Drug use.  Emotional well-being.  Home and relationship well-being.  Sexual activity.  Eating habits.  History of falls.  Memory and ability to understand (cognition).  Work and work Statistician.  Reproductive health. Screening  You may have the following tests or measurements:  Height, weight, and BMI.  Blood pressure.  Lipid and cholesterol levels. These may be checked every 5 years, or more frequently if you are over 24 years old.  Skin check.  Lung cancer screening. You may have this screening every year starting at age 72 if you have a 30-pack-year history of smoking and currently smoke or have quit within the past 15 years.  Fecal occult blood test (FOBT) of the stool. You may have this test every year starting at age 72.  Flexible sigmoidoscopy or colonoscopy. You may have a sigmoidoscopy every 5 years or a colonoscopy every 10 years starting at age 72.  Hepatitis C blood test.  Hepatitis B blood test.  Sexually transmitted disease (STD) testing.  Diabetes screening. This is done by checking your blood sugar (glucose) after you have not eaten for a while (fasting). You may have this done every 1-3 years.  Bone density scan. This is done to screen for osteoporosis. You may have this done starting at age 72.  Mammogram. This may be done every 1-2 years. Talk to your health care provider about how often you should have regular mammograms. Talk with your health care provider about your test results, treatment options, and if necessary, the need for more tests. Vaccines  Your health care provider may recommend certain vaccines, such as:  Influenza vaccine. This is recommended every year.  Tetanus, diphtheria, and acellular pertussis (Tdap, Td) vaccine. You may need a Td booster every 10 years.  Zoster vaccine. You  may need this after age 72.  Pneumococcal 13-valent conjugate (PCV13) vaccine. One dose is recommended after age 72.  Pneumococcal polysaccharide (PPSV23) vaccine. One dose is recommended after age 72. Talk to your health care provider about which screenings and vaccines you need and how often you need them. This information is not intended to replace advice given to you by your health care provider. Make sure you discuss any questions you have with your health care provider. Document Released: 05/04/2015 Document Revised: 12/26/2015 Document Reviewed: 02/06/2015 Elsevier Interactive Patient Education  2017 [place] Prevention in the Home Falls can cause injuries. They can happen to people of all ages. There are many things you can do to make your home safe and to help prevent falls. What can I do on the outside of my home?  Regularly fix the edges of walkways and driveways and fix any cracks.  Remove anything that might make you trip as you walk through a door, such as a raised step or threshold.  Trim any bushes or trees on the path to your home.  Use bright outdoor lighting.  Clear any walking paths of anything that might make someone trip, such as rocks or tools.  Regularly check to see if handrails are loose or broken. Make sure that both sides of any steps have handrails.  Any raised decks and porches should have guardrails on the edges.  Have any leaves, snow, or ice cleared regularly.  Use sand or salt on walking paths during winter.  Clean up any spills in your garage right away. This includes oil or grease spills. What can I do in the bathroom?  Use night lights.  Install grab bars by the toilet and in the tub and shower. Do not use towel bars as grab bars.  Use non-skid mats or decals in the tub or shower.  If you need to sit down in the shower, use a plastic, non-slip stool.  Keep the floor dry. Clean up any water that spills on the floor as soon as  it happens.  Remove soap buildup in the tub or shower regularly.  Attach bath mats securely with double-sided non-slip rug tape.  Do not have throw rugs and other things on the floor that can make you trip. What can I do in the bedroom?  Use night lights.  Make sure that you have a light by your bed that is easy to reach.  Do not use any sheets or blankets that are too big for your bed. They should not hang down onto the floor.  Have a firm chair that has side arms. You can use this for support while you get dressed.  Do not have throw rugs and other things on the floor that can make you trip. What can I do in the kitchen?  Clean up any spills right away.  Avoid walking on wet floors.  Keep items that you use a lot in easy-to-reach places.  If you need to reach something above you, use a strong step stool that has a grab bar.  Keep electrical cords out of the way.  Do not use floor polish or wax that makes floors slippery. If you must use wax, use non-skid floor wax.  Do  not have throw rugs and other things on the floor that can make you trip. What can I do with my stairs?  Do not leave any items on the stairs.  Make sure that there are handrails on both sides of the stairs and use them. Fix handrails that are broken or loose. Make sure that handrails are as long as the stairways.  Check any carpeting to make sure that it is firmly attached to the stairs. Fix any carpet that is loose or worn.  Avoid having throw rugs at the top or bottom of the stairs. If you do have throw rugs, attach them to the floor with carpet tape.  Make sure that you have a light switch at the top of the stairs and the bottom of the stairs. If you do not have them, ask someone to add them for you. What else can I do to help prevent falls?  Wear shoes that:  Do not have high heels.  Have rubber bottoms.  Are comfortable and fit you well.  Are closed at the toe. Do not wear sandals.  If  you use a stepladder:  Make sure that it is fully opened. Do not climb a closed stepladder.  Make sure that both sides of the stepladder are locked into place.  Ask someone to hold it for you, if possible.  Clearly mark and make sure that you can see:  Any grab bars or handrails.  First and last steps.  Where the edge of each step is.  Use tools that help you move around (mobility aids) if they are needed. These include:  Canes.  Walkers.  Scooters.  Crutches.  Turn on the lights when you go into a dark area. Replace any light bulbs as soon as they burn out.  Set up your furniture so you have a clear path. Avoid moving your furniture around.  If any of your floors are uneven, fix them.  If there are any pets around you, be aware of where they are.  Review your medicines with your doctor. Some medicines can make you feel dizzy. This can increase your chance of falling. Ask your doctor what other things that you can do to help prevent falls. This information is not intended to replace advice given to you by your health care provider. Make sure you discuss any questions you have with your health care provider. Document Released: 02/01/2009 Document Revised: 09/13/2015 Document Reviewed: 05/12/2014 Elsevier Interactive Patient Education  2017 [employer].

## 2020-01-20 ENCOUNTER — Other Ambulatory Visit: Payer: Self-pay | Admitting: Neurology

## 2020-01-22 ENCOUNTER — Other Ambulatory Visit: Payer: Self-pay | Admitting: Pulmonary Disease

## 2020-01-24 ENCOUNTER — Other Ambulatory Visit: Payer: Self-pay | Admitting: Neurology

## 2020-01-25 DIAGNOSIS — J3089 Other allergic rhinitis: Secondary | ICD-10-CM | POA: Diagnosis not present

## 2020-01-25 DIAGNOSIS — J301 Allergic rhinitis due to pollen: Secondary | ICD-10-CM | POA: Diagnosis not present

## 2020-01-25 DIAGNOSIS — J3081 Allergic rhinitis due to animal (cat) (dog) hair and dander: Secondary | ICD-10-CM | POA: Diagnosis not present

## 2020-01-27 ENCOUNTER — Ambulatory Visit: Payer: Medicare HMO | Admitting: Orthopaedic Surgery

## 2020-01-27 ENCOUNTER — Ambulatory Visit (INDEPENDENT_AMBULATORY_CARE_PROVIDER_SITE_OTHER): Payer: Medicare HMO

## 2020-01-27 ENCOUNTER — Ambulatory Visit: Payer: Self-pay

## 2020-01-27 ENCOUNTER — Encounter: Payer: Self-pay | Admitting: Orthopaedic Surgery

## 2020-01-27 ENCOUNTER — Other Ambulatory Visit: Payer: Self-pay

## 2020-01-27 DIAGNOSIS — J3081 Allergic rhinitis due to animal (cat) (dog) hair and dander: Secondary | ICD-10-CM | POA: Diagnosis not present

## 2020-01-27 DIAGNOSIS — G8929 Other chronic pain: Secondary | ICD-10-CM | POA: Diagnosis not present

## 2020-01-27 DIAGNOSIS — M25562 Pain in left knee: Secondary | ICD-10-CM | POA: Diagnosis not present

## 2020-01-27 DIAGNOSIS — J3089 Other allergic rhinitis: Secondary | ICD-10-CM | POA: Diagnosis not present

## 2020-01-27 DIAGNOSIS — M25571 Pain in right ankle and joints of right foot: Secondary | ICD-10-CM

## 2020-01-27 DIAGNOSIS — M25561 Pain in right knee: Secondary | ICD-10-CM

## 2020-01-27 DIAGNOSIS — J301 Allergic rhinitis due to pollen: Secondary | ICD-10-CM | POA: Diagnosis not present

## 2020-01-27 MED ORDER — BUPIVACAINE HCL 0.25 % IJ SOLN
2.0000 mL | INTRAMUSCULAR | Status: AC | PRN
  Administered 2020-01-27: 2 mL via INTRA_ARTICULAR

## 2020-01-27 MED ORDER — LIDOCAINE HCL 1 % IJ SOLN
2.0000 mL | INTRAMUSCULAR | Status: AC | PRN
  Administered 2020-01-27: 2 mL

## 2020-01-27 MED ORDER — METHYLPREDNISOLONE ACETATE 40 MG/ML IJ SUSP
40.0000 mg | INTRAMUSCULAR | Status: AC | PRN
  Administered 2020-01-27: 40 mg via INTRA_ARTICULAR

## 2020-01-27 MED ORDER — METHOCARBAMOL 500 MG PO TABS: 500.0000 mg | ORAL_TABLET | Freq: Two times a day (BID) | ORAL | 0 refills | Status: DC | PRN

## 2020-01-27 NOTE — Progress Notes (Signed)
Office Visit Note   Patient: Beverly Ballard           Date of Birth: Mar 08, 1946           MRN: [id number] Visit Date: 01/27/2020              Requested by: Caren Macadam, MD [place],  [place] [id number] PCP: Caren Macadam, MD   Assessment & Plan: Visit Diagnoses:  1. Chronic pain of both knees   2. Pain in right ankle and joints of right foot     Plan: Impression is bilateral knee contusions with reactive synovitis on the right.  We have discussed intra-articular cortisone injection to the right knee and the patient would like to proceed.  She will follow up with [place] as needed.  Follow-Up Instructions: Return if symptoms worsen or fail to improve.   Orders:  Orders Placed This Encounter  Procedures  . XR KNEE 3 VIEW LEFT  . XR KNEE 3 VIEW RIGHT  . XR Ankle Complete Right  . Ambulatory referral to Physical Therapy   No orders of the defined types were placed in this encounter.     Procedures: Large Joint Inj: R knee on 01/27/2020 9:36 AM Indications: pain Details: 22 G needle, anterolateral approach Medications: 2 mL lidocaine 1 %; 2 mL bupivacaine 0.25 %; 40 mg methylPREDNISolone acetate 40 MG/ML      Clinical Data: No additional findings.   Subjective: Chief Complaint  Patient presents with  . Right Knee - Pain  . Left Knee - Pain  . Right Ankle - Pain    HPI patient is a pleasant 74 year old female who comes in today with bilateral knee pain.  About 3 weeks ago, she slipped and fell landing on the anterior aspect of both knees.  She has had pain to both knees equally as bad.  All of her pain is to the anterior aspect and is described as a constant ache.  She does note that she has weakness to both legs.  She has increased pain going from a seated to standing position.  She has not taken any medication for this.  Of note, she is status post left total knee revision from 2016.  She also notes that she has had some slightly  increased right ankle pain.  She has had off-and-on ankle pain and swelling for a while following an injury a while back.  She has recently been on prednisone for 4 months for lung infection and has recently finished.  Review of Systems as detailed in HPI.  All others reviewed and are negative.   Objective: Vital Signs: There were no vitals taken for this visit.  Physical Exam well-developed well-nourished female no acute distress.  Alert oriented x3.  Ortho Exam examination of both knees reveals hypersensitivity to all areas.  She is able to range from 0 to 100 degrees.  Stable valgus varus stress.  Right ankle exam she has mild swelling.  No tenderness throughout.   Specialty Comments:  No specialty comments available.  Imaging: XR Ankle Complete Right  Result Date: 01/27/2020 No acute or structural abnormalities  XR KNEE 3 VIEW LEFT  Result Date: 01/27/2020 X-rays demonstrate a well-seated prosthesis without complication  XR KNEE 3 VIEW RIGHT  Result Date: 01/27/2020 Moderate degenerative changes medial and patellofemoral compartments    PMFS History: Patient Active Problem List   Diagnosis Date Noted  . Ankle edema, bilateral 10/31/2019  . Excessive daytime sleepiness 06/28/2019  .  Shortness of breath 06/28/2019  . Healthcare maintenance 06/28/2019  . ANA positive 06/28/2019  . Bronchiectasis ([place]) 06/28/2019  . Depression, major, single episode, moderate ([place]) 11/19/2018  . Fatigue 03/23/2018  . Recurrent major depressive disorder, in partial remission ([place]) 05/04/2017  . Left knee pain 01/12/2017  . Ovarian cyst 10/30/2016  . Aortic atherosclerosis ([place]) 11/19/2015  . Hyperlipemia 04/03/2015  . S/P revision of total knee 09/19/2014  . Seizure disorder ([place] [facility]) 01/24/2014  . Obesity, BMI unknown 01/24/2014  . Osteopenia, stable on dexa 2010 - followed by gyn 06/15/2012  . Pap smear abnormality of cervix/human papillomavirus (HPV) positive - 09/2011, followed by gyn  06/15/2012  . Essential hypertension 07/03/2009  . Hypothyroidism 05/27/2007  . Allergic rhinitis 05/27/2007  . GERD 05/27/2007   Past Medical History:  Diagnosis Date  . Allergy   . Anxiety and depression 12/19/2009  . Arthritis   . Cancer (HCC)    vaginal  . Constipation   . Degenerative disorder of eye   . Diverticulosis   . Essential hypertension, benign 06/26/2009  . GERD 05/27/2007   takes Nexium daily  . Heart murmur    yrs ago  . History of gout   . History of migraine many yrs ago  . Hyperlipidemia    takes Pravastatin daily  . Hypothyroidism   . Insomnia    but doesn't take any meds  . LOW BACK PAIN 05/27/2007  . Numbness    left toes   . Osteoporosis   . Seizures ([place])    last 1983, none since then- last seizure 1988 per pt     Family History  Problem Relation Age of Onset  . Aortic dissection Father   . Arthritis Mother   . Stroke Mother 67  . Heart murmur Other   . Colon cancer Neg Hx   . Esophageal cancer Neg Hx   . Stomach cancer Neg Hx   . Rectal cancer Neg Hx   . Colon polyps Neg Hx     Past Surgical History:  Procedure Laterality Date  . ADENOIDECTOMY    . BACK SURGERY     lumbar x2  . BRONCHIAL BIOPSY  09/12/2019   Procedure: BRONCHIAL BIOPSIES;  Surgeon: Laurin Coder, MD;  Location: [facility];  Service: Endoscopy;;  . BRONCHIAL WASHINGS  09/12/2019   Procedure: BRONCHIAL WASHINGS;  Surgeon: Laurin Coder, MD;  Location: [facility];  Service: Endoscopy;;  . COLONOSCOPY  2009  . ESOPHAGOGASTRODUODENOSCOPY    . Heel spurs Bilateral   . HEMOSTASIS CONTROL  09/12/2019   Procedure: HEMOSTASIS CONTROL;  Surgeon: Laurin Coder, MD;  Location: [facility];  Service: Endoscopy;;  epi  . KNEE ARTHROPLASTY Left 11/07/2013   Procedure: COMPUTER ASSISTED TOTAL KNEE ARTHROPLASTY;  Surgeon: Marybelle Killings, MD;  Location: [facility];  Service: Orthopedics;  Laterality: Left;  Left Total Knee Arthroplasty, Cemented, Computer Assist  . TONSILLECTOMY     . TOTAL KNEE REVISION Left 09/19/2014   Procedure: LEFT TOTAL KNEE REVISION;  Surgeon: Leandrew Koyanagi, MD;  Location: [facility];  Service: Orthopedics;  Laterality: Left;  . UPPER GASTROINTESTINAL ENDOSCOPY    . VIDEO BRONCHOSCOPY N/A 09/12/2019   Procedure: VIDEO BRONCHOSCOPY WITH FLUORO;  Surgeon: Laurin Coder, MD;  Location: [facility];  Service: Endoscopy;  Laterality: N/A;   Social History   Occupational History  . Occupation: Retired  Tobacco Use  . Smoking status: Former Smoker    Packs/day: 0.25    Years: 20.00  Pack years: 5.00    Types: Cigarettes    Quit date: 09/08/1978    Years since quitting: 41.4  . Smokeless tobacco: Never Used  . Tobacco comment: quit smoking 98yrs ago  Vaping Use  . Vaping Use: Never used  Substance and Sexual Activity  . Alcohol use: No  . Drug use: No  . Sexual activity: Never    Birth control/protection: Post-menopausal

## 2020-01-30 DIAGNOSIS — G4733 Obstructive sleep apnea (adult) (pediatric): Secondary | ICD-10-CM | POA: Diagnosis not present

## 2020-01-30 DIAGNOSIS — J3081 Allergic rhinitis due to animal (cat) (dog) hair and dander: Secondary | ICD-10-CM | POA: Diagnosis not present

## 2020-01-30 DIAGNOSIS — R059 Cough, unspecified: Secondary | ICD-10-CM | POA: Diagnosis not present

## 2020-01-30 DIAGNOSIS — J301 Allergic rhinitis due to pollen: Secondary | ICD-10-CM | POA: Diagnosis not present

## 2020-01-30 DIAGNOSIS — J3089 Other allergic rhinitis: Secondary | ICD-10-CM | POA: Diagnosis not present

## 2020-01-30 DIAGNOSIS — H1045 Other chronic allergic conjunctivitis: Secondary | ICD-10-CM | POA: Diagnosis not present

## 2020-01-31 NOTE — Progress Notes (Signed)
She is about due for a visit with me. I know she is seeing a lot of specialists, and so it is no rush. I just think it would be good for [place] to get together in person and review how she is feeling. Her pulmonologist reached out to me just stating that she continues to not feel great. I think a visit before end of the year would be good. Also limiting some of the meds that can be sedating/affect energy level (flexeril, robaxin, ativan, zofran) is good when able.

## 2020-02-06 ENCOUNTER — Encounter: Payer: Self-pay | Admitting: Pulmonary Disease

## 2020-02-06 ENCOUNTER — Encounter: Payer: Self-pay | Admitting: Physical Therapy

## 2020-02-06 ENCOUNTER — Ambulatory Visit: Payer: Medicare HMO | Admitting: Physical Therapy

## 2020-02-06 ENCOUNTER — Ambulatory Visit: Payer: Medicare HMO | Admitting: Pulmonary Disease

## 2020-02-06 ENCOUNTER — Other Ambulatory Visit: Payer: Self-pay

## 2020-02-06 VITALS — BP 118/70 | HR 76 | Temp 98.3°F | Ht 65.0 in | Wt 250.4 lb

## 2020-02-06 DIAGNOSIS — G8929 Other chronic pain: Secondary | ICD-10-CM | POA: Diagnosis not present

## 2020-02-06 DIAGNOSIS — R2689 Other abnormalities of gait and mobility: Secondary | ICD-10-CM | POA: Diagnosis not present

## 2020-02-06 DIAGNOSIS — R0602 Shortness of breath: Secondary | ICD-10-CM | POA: Diagnosis not present

## 2020-02-06 DIAGNOSIS — M25561 Pain in right knee: Secondary | ICD-10-CM

## 2020-02-06 DIAGNOSIS — M25662 Stiffness of left knee, not elsewhere classified: Secondary | ICD-10-CM | POA: Diagnosis not present

## 2020-02-06 DIAGNOSIS — M6281 Muscle weakness (generalized): Secondary | ICD-10-CM | POA: Diagnosis not present

## 2020-02-06 DIAGNOSIS — R600 Localized edema: Secondary | ICD-10-CM

## 2020-02-06 DIAGNOSIS — Z9989 Dependence on other enabling machines and devices: Secondary | ICD-10-CM

## 2020-02-06 DIAGNOSIS — G4733 Obstructive sleep apnea (adult) (pediatric): Secondary | ICD-10-CM | POA: Diagnosis not present

## 2020-02-06 DIAGNOSIS — M25562 Pain in left knee: Secondary | ICD-10-CM

## 2020-02-06 NOTE — Patient Instructions (Signed)
No changes made to your medications today  Wean down some of the medications as we discussed  Methocarbamol can be cut in half as we discussed  We can add an inhaled steroid if needed if you are using your albuterol frequently  Use albuterol as needed for wheezing, shortness of breath, call to let me know if daily use required  I will see you back in 3 months  Continue using your CPAP on a regular basis-the data from your download shows that it is helping

## 2020-02-06 NOTE — Patient Instructions (Signed)
Access Code: [email or web address] URL: [email or web address] Date: 02/06/2020 Prepared by: [name]  Exercises Supine Quadriceps Stretch with Strap on Table - 2 x daily - 6 x weekly - 2-3 reps - 30 hold Supine Bridge - 2 x daily - 6 x weekly - 10 reps - 1-2 sets - 5 hold Straight Leg Raise - 2 x daily - 6 x weekly - 1-2 sets - 10 reps Seated Hamstring Stretch - 2 x daily - 6 x weekly - 3 reps - 1 sets - 30 hold Seated Knee Flexion Stretch - 2 x daily - 6 x weekly - 1-2 sets - 10 reps - 5 sec hold Mini Squat with Counter Support - 2 x daily - 6 x weekly - 1-2 sets - 10 reps

## 2020-02-06 NOTE — Therapy (Signed)
[facility] [address] [place], [place], [address] Phone: [phone]   Fax:  [phone]  Physical Therapy Evaluation  Patient Details  Name: Beverly Ballard MRN: [id number] Date of Birth: June 10, 1945 Referring Provider (PT): Aundra Dubin, [place]   Encounter Date: 02/06/2020   PT End of Session - 02/06/20 1012    Visit Number 1    Number of Visits 15    Date for PT Re-Evaluation 04/02/20    PT Start Time 0930    PT Stop Time 1010    PT Time Calculation (min) 40 min    Activity Tolerance Patient tolerated treatment well    Behavior During Therapy [facility] for tasks assessed/performed           Past Medical History:  Diagnosis Date  . Allergy   . Anxiety and depression 12/19/2009  . Arthritis   . Cancer (HCC)    vaginal  . Constipation   . Degenerative disorder of eye   . Diverticulosis   . Essential hypertension, benign 06/26/2009  . GERD 05/27/2007   takes Nexium daily  . Heart murmur    yrs ago  . History of gout   . History of migraine many yrs ago  . Hyperlipidemia    takes Pravastatin daily  . Hypothyroidism   . Insomnia    but doesn't take any meds  . LOW BACK PAIN 05/27/2007  . Numbness    left toes   . Osteoporosis   . Seizures ([place])    last 1983, none since then- last seizure 1988 per pt     Past Surgical History:  Procedure Laterality Date  . ADENOIDECTOMY    . BACK SURGERY     lumbar x2  . BRONCHIAL BIOPSY  09/12/2019   Procedure: BRONCHIAL BIOPSIES;  Surgeon: Laurin Coder, MD;  Location: [facility];  Service: Endoscopy;;  . BRONCHIAL WASHINGS  09/12/2019   Procedure: BRONCHIAL WASHINGS;  Surgeon: Laurin Coder, MD;  [facility];  Service: Endoscopy;;  . COLONOSCOPY  2009  . ESOPHAGOGASTRODUODENOSCOPY    . Heel spurs Bilateral   . HEMOSTASIS CONTROL  09/12/2019   Procedure: HEMOSTASIS CONTROL;  Surgeon: Laurin Coder, MD;  [facility];  Service: Endoscopy;;  epi  . KNEE  ARTHROPLASTY Left 11/07/2013   Procedure: COMPUTER ASSISTED TOTAL KNEE ARTHROPLASTY;  Surgeon: Marybelle Killings, MD;  Location: [place];  Service: Orthopedics;  Laterality: Left;  Left Total Knee Arthroplasty, Cemented, Computer Assist  . TONSILLECTOMY    . TOTAL KNEE REVISION Left 09/19/2014   Procedure: LEFT TOTAL KNEE REVISION;  Surgeon: Leandrew Koyanagi, MD;  Location: [place];  Service: Orthopedics;  Laterality: Left;  . UPPER GASTROINTESTINAL ENDOSCOPY    . VIDEO BRONCHOSCOPY N/A 09/12/2019   Procedure: VIDEO BRONCHOSCOPY WITH FLUORO;  Surgeon: Laurin Coder, MD;  [facility];  Service: Endoscopy;  Laterality: N/A;    There were no vitals filed for this visit.    Subjective Assessment - 02/06/20 0927    Subjective About 5 weeks ago she slipped and fell landing on the anterior aspect of both knees. She says her Lt knee pain is worse, feels like it might give out on her, feels off balance, feels stiff. Had injection 01/27/20 to Rt knee. She is S/P Lt TKA revision 2016. She also complains of low back pain but PT referral only lists knee pain    Pertinent History PMH: Lt TKA revision 2016, depression, fatigue, obesity,osteopenia, lumbar surgery X2  Limitations Standing;Walking;Lifting    How long can you stand comfortably? 5 min (more limited by back pain than knee pain she states)    How long can you walk comfortably? 5 min    Diagnostic tests "XR Lt knee X-rays demonstrate a well-seated prosthesis without complication. XR Rt knee Moderate degenerative changes medial and patellofemoral compartments"    Patient Stated Goals get the pain down, improve function    Currently in Pain? Yes    Pain Score 7    Lt knee is worse   Pain Location Knee    Pain Orientation Right;Left    Pain Descriptors / Indicators Aching;Numbness;Tightness    Pain Type Chronic pain    Pain Radiating Towards her back down to her knees    Pain Onset More than a month ago    Pain Frequency Intermittent     Aggravating Factors  walking and standing    Pain Relieving Factors rest              [facility] PT Assessment - 02/06/20 0001      Assessment   Medical Diagnosis Chronic pain of both knees    Referring Provider (PT) Nathaniel Man    Next MD Visit nothing scheduled      Precautions   Precaution Comments fall      Balance Screen   Has the patient fallen in the past 6 months Yes    How many times? 2    Has the patient had a decrease in activity level because of a fear of falling?  Yes    Is the patient reluctant to leave their home because of a fear of falling?  Yes      [place] residence    Additional Comments one level inside but steps to enter      Prior Function   Level of [place] Retired    Ballard nothing      Cognition   Overall Cognitive Status Within Functional Limits for tasks assessed      Observation/Other Assessments   Observations FOTO functional score 33%, predicted 47%      ROM / Strength   AROM / PROM / Strength AROM;PROM;Strength      AROM   AROM Assessment Site Knee    Right/Left Knee Right;Left    Right Knee Extension 3    Right Knee Flexion 122    Left Knee Extension 3    Left Knee Flexion 115      PROM   PROM Assessment Site Knee    Right/Left Knee Left    Left Knee Flexion 120      Strength   Strength Assessment Site Knee;Hip    Right/Left Hip Right;Left    Right Hip Flexion 4-/5    Right Hip ABduction 4-/5    Left Hip Flexion 4-/5    Left Hip ABduction 4-/5    Right/Left Knee Right;Left    Right Knee Flexion 4+/5    Right Knee Extension 4/5    Left Knee Flexion 4-/5    Left Knee Extension 4-/5      Ambulation/Gait   Ambulation Distance (Feet) 100 Feet    Gait Pattern Decreased stance time - left;Decreased stride length;Decreased hip/knee flexion - right;Decreased hip/knee flexion - left;Antalgic;Wide base of support    Gait velocity slower      Balance     Balance Assessed Yes      Standardized Balance  Assessment   Standardized Balance Assessment Timed Up and Go Test;Five Times Sit to Stand    Five times sit to stand comments  17.5 using hands to push up      Timed Up and Go Test   Normal TUG (seconds) 12.8                      Objective measurements completed on examination: See above findings.       [place] Adult PT Treatment/Exercise - 02/06/20 0001      Exercises   Exercises Knee/Hip      Knee/Hip Exercises: Stretches   Quad Stretch Both;2 reps;30 seconds    Quad Stretch Limitations supine with strap leg off EOB      Knee/Hip Exercises: Aerobic   Nustep 5 min L4 UE/LE seat 8      Knee/Hip Exercises: Seated   Long Arc Quad Both;10 reps      Knee/Hip Exercises: Supine   Bridges 10 reps    Straight Leg Raises Both;10 reps                  PT Education - 02/06/20 1012    Education Details HEP, POC    Person(s) Educated Patient    Methods Explanation;Demonstration;Verbal cues;Handout    Comprehension Verbalized understanding;Returned demonstration            PT Short Term Goals - 02/06/20 1017      PT SHORT TERM GOAL #1   Title Pt will be I and compliant with HEP.    Time 4    Period Weeks    Status New    Target Date 03/05/20      PT SHORT TERM GOAL #2   Title Pt will improve Lt knee strength to 4/5 MMT    Time 4    Period Weeks    Status New             PT Long Term Goals - 02/06/20 1018      PT LONG TERM GOAL #1   Title Pt will improve overall hip/knee strength to 4+ to improve function.    Time 8    Period Weeks    Status New    Target Date 04/02/20      PT LONG TERM GOAL #2   Title Pt will improve 5TSTS test to less than 14 sec to show improved balance and leg endurance    Baseline 17.5    Time 8    Period Weeks    Status New      PT LONG TERM GOAL #3   Title Pt will improve FOTO score to at least 47% functional score    Time 8    Period Weeks    Status New       PT LONG TERM GOAL #4   Title Pt will be able to stand and walk at least 15 minutes with overall 4 or less pain out of 10.    Baseline 7 pain after 5 min    Status New                  Plan - 02/06/20 1013    Clinical Impression Statement Pt presents with chronic bilat knee pain that became worse after fall 5 weeks ago. She does have history of chronic LBP with 2 lumbar surgeries and is S/P Lt TKA revision 2016. She will benefit from skilled PT to address her deficits in knee strength, knee  ROM, difficulty with standing and walking, and decreased balance.    Personal Factors and Comorbidities Comorbidity 3+    Comorbidities PMH: Lt TKA revision 2016, depression, fatigue, obesity,osteopenia, lumbar surgery X2    Examination-Activity Limitations Lift;Stand;Squat;Stairs;Locomotion Level;Bend    Examination-Participation Restrictions Cleaning;Community Activity;Driving;Shop    Stability/Clinical Decision Making Evolving/Moderate complexity    Clinical Decision Making Moderate    Rehab Potential Good    PT Frequency 2x / week    PT Duration 8 weeks    PT Treatment/Interventions ADLs/Self Care Home Management;Cryotherapy;Electrical Stimulation;Iontophoresis 4mg /ml Dexamethasone;Moist Heat;Ultrasound;Gait training;Stair training;Therapeutic activities;Therapeutic exercise;Balance training;Neuromuscular re-education;Manual techniques;Passive range of motion;Dry needling;Joint Manipulations;Taping;Vasopneumatic Device    PT Next Visit Plan review and update HEP PRN, needs hip/knee strength, ROM, and gentle strength progression. modalaties for pain PRN    PT Home Exercise Plan Access Code: [email or web address]    Consulted and Agree with Plan of Care Patient           Patient will benefit from skilled therapeutic intervention in order to improve the following deficits and impairments:  Decreased activity tolerance, Abnormal gait, Decreased mobility, Decreased strength, Decreased range of motion,  Difficulty walking, Impaired flexibility, Increased muscle spasms, Pain, Postural dysfunction, Obesity  Visit Diagnosis: Chronic pain of left knee  Chronic pain of right knee  Stiffness of left knee, not elsewhere classified  Muscle weakness (generalized)  Other abnormalities of gait and mobility  Localized edema     Problem List Patient Active Problem List   Diagnosis Date Noted  . Ankle edema, bilateral 10/31/2019  . Excessive daytime sleepiness 06/28/2019  . Shortness of breath 06/28/2019  . Healthcare maintenance 06/28/2019  . ANA positive 06/28/2019  . Bronchiectasis ([place]) 06/28/2019  . Depression, major, single episode, moderate ([place]) 11/19/2018  . Fatigue 03/23/2018  . Recurrent major depressive disorder, in partial remission ([place]) 05/04/2017  . Left knee pain 01/12/2017  . Ovarian cyst 10/30/2016  . Aortic atherosclerosis ([place]) 11/19/2015  . Hyperlipemia 04/03/2015  . S/P revision of total knee 09/19/2014  . Seizure disorder ([place]) 01/24/2014  . Obesity, BMI unknown 01/24/2014  . Osteopenia, stable on dexa 2010 - followed by gyn 06/15/2012  . Pap smear abnormality of cervix/human papillomavirus (HPV) positive - 09/2011, followed by gyn 06/15/2012  . Essential hypertension 07/03/2009  . Hypothyroidism 05/27/2007  . Allergic rhinitis 05/27/2007  . GERD 05/27/2007    Silvestre Mesi 02/06/2020, 10:22 AM  [facility] [address] [place], [place], [address] Phone: [phone]   Fax:  [phone]  Name: Beverly Ballard MRN: [id number] Date of Birth: Mar 01, 1946

## 2020-02-06 NOTE — Progress Notes (Signed)
Beverly Ballard    [id number]    1946/02/22  Primary Care Physician:Koberlein, Steele Berg, MD  Referring Physician: Caren Macadam, MD [place],  [place] [address]  Chief complaint:    Follow-up of obstructive sleep apnea and hypersensitivity pneumonitis Still feels short of breath  HPI: Diagnosed hypersensitive pneumonitis Weaned off prednisone secondary to persistent symptoms, did not feel it was helping  Leg swelling Leg numbness with recent fall Will be starting physical therapy soon to help strengthen lower extremities Recent treatment for lower respiratory tract infection  She did sound a little bit better today compared to the last time I saw her She remains short of breath  Has been very compliant with CPAP use  Dose of Zoloft was also recently increased-now 200 mg She feels she does not have any other social pressures that may be contributing to her just feeling poorly generally  She does have a history of chronic shortness of breath Chronic back pain-on Flexeril She is a couple of back surgeries in the past Chronic knee pain  Recently had a bronchoscopy-increased neutrophils on BAL, transbronchial biopsies were mainly bronchial tissue  She did smoke in the past-claims never to be a heavy smoker She does have a history of allergies for which she receives allergy shots  Pets: No pets Occupation: Worked retail Exposures: No mold exposure Smoking history: Quit smoking many years ago Travel history: Visits to [place] frequently Relevant family history: No contributory family history of lung disease  Outpatient Encounter Medications as of 02/06/2020  Medication Sig  . albuterol (VENTOLIN HFA) 108 (90 Base) MCG/ACT inhaler Inhale 2 puffs into the lungs every 4 (four) hours as needed for wheezing or shortness of breath.  Marland Kitchen aspirin 81 MG tablet Take 81 mg by mouth daily.  Marland Kitchen b complex vitamins tablet Take 1 tablet by mouth daily.  . beta  carotene w/minerals (OCUVITE) tablet Take 1 tablet by mouth daily.  . Cholecalciferol (VITAMIN D3 PO) Take by mouth.  . cyclobenzaprine (FLEXERIL) 10 MG tablet Take 1 tablet (10 mg total) by mouth 2 (two) times daily as needed for muscle spasms.  Marland Kitchen EPINEPHrine 0.3 mg/0.3 mL IJ SOAJ injection SMARTSIG:Injection As Directed  . esomeprazole (NEXIUM) 20 MG capsule Take 2 capsules (40 mg total) by mouth daily before breakfast. (Patient taking differently: Take 20 mg by mouth daily before breakfast. )  . fluticasone (FLONASE) 50 MCG/ACT nasal spray Place 2 sprays into both nostrils daily. (Patient taking differently: Place 2 sprays into both nostrils daily as needed for allergies. )  . furosemide (LASIX) 20 MG tablet TAKE 1 TABLET BY MOUTH IN THE MORNING  . levocetirizine (XYZAL) 5 MG tablet Take 5 mg by mouth daily.   Marland Kitchen levothyroxine (SYNTHROID) 88 MCG tablet Take 1 tablet (88 mcg total) by mouth daily.  Marland Kitchen lisinopril (ZESTRIL) 5 MG tablet Take 1 tablet (5 mg total) by mouth daily.  Marland Kitchen LORazepam (ATIVAN) 0.5 MG tablet Take 1 tablet (0.5 mg total) by mouth 4 (four) times daily as needed for anxiety.  . methocarbamol (ROBAXIN) 500 MG tablet Take 1 tablet (500 mg total) by mouth 2 (two) times daily as needed.  . ondansetron (ZOFRAN ODT) 4 MG disintegrating tablet Take 1 tablet (4 mg total) by mouth every 6 (six) hours as needed for nausea or vomiting.  . pravastatin (PRAVACHOL) 20 MG tablet Take 1 tablet (20 mg total) by mouth daily. TAKE 1 TABLET BY MOUTH EVERY  DAY  . sertraline (ZOLOFT) 100 MG tablet Take 2 tablets (200 mg total) by mouth daily.  Marland Kitchen Spacer/Aero-Holding Dorise Bullion Use as directed with inhaler  . zonisamide (ZONEGRAN) 100 MG capsule TAKE 2 CAPSULES (200 MG TOTAL) BY MOUTH 2 (TWO) TIMES DAILY.   No facility-administered encounter medications on file as of 02/06/2020.    Allergies as of 02/06/2020 - Review Complete 02/06/2020  Allergen Reaction Noted  . Lamotrigine Swelling 07/03/2015    . Bupropion Other (See Comments) 05/02/2016  . Carbamazepine Other (See Comments) 07/03/2015  . Levetiracetam  07/03/2015  . Tramadol Other (See Comments) 05/02/2016  . Adhesive [tape]  09/08/2014  . Clarithromycin    . Doxycycline    . Nickel  08/11/2014  . Other  08/11/2014  . Topamax [topiramate] Nausea And Vomiting 12/05/2019  . Estradiol Rash 07/03/2015  . Latex Rash 01/28/2018  . Phenytoin sodium extended Other (See Comments) 07/03/2015    Past Medical History:  Diagnosis Date  . Allergy   . Anxiety and depression 12/19/2009  . Arthritis   . Cancer (HCC)    vaginal  . Constipation   . Degenerative disorder of eye   . Diverticulosis   . Essential hypertension, benign 06/26/2009  . GERD 05/27/2007   takes Nexium daily  . Heart murmur    yrs ago  . History of gout   . History of migraine many yrs ago  . Hyperlipidemia    takes Pravastatin daily  . Hypothyroidism   . Insomnia    but doesn't take any meds  . LOW BACK PAIN 05/27/2007  . Numbness    left toes   . Osteoporosis   . Seizures ([place])    last 1983, none since then- last seizure 1988 per pt     Past Surgical History:  Procedure Laterality Date  . ADENOIDECTOMY    . BACK SURGERY     lumbar x2  . BRONCHIAL BIOPSY  09/12/2019   Procedure: BRONCHIAL BIOPSIES;  Surgeon: Laurin Coder, MD;  [facility];  Service: Endoscopy;;  . BRONCHIAL WASHINGS  09/12/2019   Procedure: BRONCHIAL WASHINGS;  Surgeon: Laurin Coder, MD;  [facility];  Service: Endoscopy;;  . COLONOSCOPY  2009  . ESOPHAGOGASTRODUODENOSCOPY    . Heel spurs Bilateral   . HEMOSTASIS CONTROL  09/12/2019   Procedure: HEMOSTASIS CONTROL;  Surgeon: Laurin Coder, MD;  [facility];  Service: Endoscopy;;  epi  . KNEE ARTHROPLASTY Left 11/07/2013   Procedure: COMPUTER ASSISTED TOTAL KNEE ARTHROPLASTY;  Surgeon: Marybelle Killings, MD;  Location: [place];  Service: Orthopedics;  Laterality: Left;  Left Total Knee  Arthroplasty, Cemented, Computer Assist  . TONSILLECTOMY    . TOTAL KNEE REVISION Left 09/19/2014   Procedure: LEFT TOTAL KNEE REVISION;  Surgeon: Leandrew Koyanagi, MD;  Location: [place];  Service: Orthopedics;  Laterality: Left;  . UPPER GASTROINTESTINAL ENDOSCOPY    . VIDEO BRONCHOSCOPY N/A 09/12/2019   Procedure: VIDEO BRONCHOSCOPY WITH FLUORO;  Surgeon: Laurin Coder, MD;  [facility];  Service: Endoscopy;  Laterality: N/A;    Family History  Problem Relation Age of Onset  . Aortic dissection Father   . Arthritis Mother   . Stroke Mother 79  . Heart murmur Other   . Colon cancer Neg Hx   . Esophageal cancer Neg Hx   . Stomach cancer Neg Hx   . Rectal cancer Neg Hx   . Colon polyps Neg Hx     Social  History   Socioeconomic History  . Marital status: Married    Spouse name: Not on file  . Number of children: 1  . Years of education: Not on file  . Highest education level: Not on file  Occupational History  . Occupation: Retired  Tobacco Use  . Smoking status: Former Smoker    Packs/day: 0.25    Years: 20.00    Pack years: 5.00    Types: Cigarettes    Quit date: 09/08/1978    Years since quitting: 41.4  . Smokeless tobacco: Never Used  . Tobacco comment: quit smoking 21yr ago  Vaping Use  . Vaping Use: Never used  Substance and Sexual Activity  . Alcohol use: No  . Drug use: No  . Sexual activity: Never    Birth control/protection: Post-menopausal  Other Topics Concern  . Not on file  Social History Narrative   Right handed    Lives with husband    Social Determinants of Health   Financial Resource Strain: Low Risk   . Difficulty of Paying Living Expenses: Not hard at all  Food Insecurity: No Food Insecurity  . Worried About RCharity fundraiserin the Last Year: Never true  . Ran Out of Food in the Last Year: Never true  Transportation Needs: No Transportation Needs  . Lack of Transportation (Medical): No  . Lack of Transportation  (Non-Medical): No  Physical Activity: Inactive  . Days of Exercise per Week: 0 days  . Minutes of Exercise per Session: 0 min  Stress: Stress Concern Present  . Feeling of Stress : To some extent  Social Connections: Socially Isolated  . Frequency of Communication with Friends and Family: Twice a week  . Frequency of Social Gatherings with Friends and Family: Never  . Attends Religious Services: Never  . Active Member of Clubs or Organizations: No  . Attends CArchivistMeetings: Never  . Marital Status: Married  IHuman resources officerViolence: Not At Risk  . Fear of Current or Ex-Partner: No  . Emotionally Abused: No  . Physically Abused: No  . Sexually Abused: No    Review of Systems  Constitutional: Positive for fatigue.  HENT: Negative for postnasal drip and rhinorrhea.   Eyes: Negative.   Respiratory: Positive for apnea and shortness of breath. Negative for cough.   Cardiovascular: Negative.   Gastrointestinal: Negative.   Endocrine: Negative.   Genitourinary: Negative.   Musculoskeletal: Positive for arthralgias and back pain.       Knee pain  Allergic/Immunologic: Positive for environmental allergies.  Neurological: Negative.   Hematological: Negative.   Psychiatric/Behavioral: Positive for dysphoric mood and sleep disturbance.  All other systems reviewed and are negative.   Vitals:   02/06/20 1336  BP: 118/70  Pulse: 76  SpO2: 98%     Physical Exam Constitutional:      General: She is not in acute distress.    Appearance: She is well-developed. She is not diaphoretic.  Eyes:     General:        Right eye: No discharge.        Left eye: No discharge.  Neck:     Thyroid: No thyromegaly.     Trachea: No tracheal deviation.  Cardiovascular:     Rate and Rhythm: Normal rate and regular rhythm.     Heart sounds: No murmur heard.  No friction rub.  Pulmonary:     Effort: Pulmonary effort is normal. No respiratory distress.  Breath sounds: No  stridor. No wheezing or rhonchi.  Musculoskeletal:     Right lower leg: Edema present.     Left lower leg: Edema present.  Neurological:     General: No focal deficit present.   Data reviewed:  Compliance data shows 97% compliance Average use of 4 hours 46 minutes Residual AHI 1.6  Recent echocardiogram reviewed showing diastolic dysfunction, normal ejection fraction 10/18/2019  Recent CT scan of the chest reviewed-12/21/2019-scarring upper lobes unchanged-reviewed by myself  Assessment:  Chronic shortness of breath-multifactorial -Multifactorial shortness of breath -Currently of steroids -Deconditioning may be contributing -She does have a lot of arthritic pains and muscle soreness that she requires muscle relaxants for -Lower doses encouraged  Chronic hypersensitivity pneumonitis -Of steroids -Inhaled steroids if needed  Significant back pain and knee pain  -This is chronic  Leg swelling is new -Lasix for leg swelling did help  Previous smoker, significant exposure to secondhand smoke in the past-no underlying lung disease known -PFT does not reveal any significant obstruction -No significant need to repeat PFT at present  History of allergies for which she receives allergy shots  Obstructive sleep apnea -Compliant with CPAP use  Plan/Recommendations:  Continue to use albuterol as needed  Continue CPAP on a regular basis  Regular exercises, graded exercises as tolerated  Multifactorial shortness of breath  Weaning off any sedating medications will help  Encouraged to follow-up with physical therapy  Follow-up in 3 months  Sherrilyn Rist MD [facility] 02/06/2020, 1:38 PM  CC: Caren Macadam, MD

## 2020-02-09 ENCOUNTER — Other Ambulatory Visit: Payer: Self-pay

## 2020-02-09 ENCOUNTER — Encounter: Payer: Self-pay | Admitting: Physician Assistant

## 2020-02-09 ENCOUNTER — Ambulatory Visit (INDEPENDENT_AMBULATORY_CARE_PROVIDER_SITE_OTHER): Payer: Medicare HMO | Admitting: Physician Assistant

## 2020-02-09 DIAGNOSIS — R69 Illness, unspecified: Secondary | ICD-10-CM | POA: Diagnosis not present

## 2020-02-09 DIAGNOSIS — F411 Generalized anxiety disorder: Secondary | ICD-10-CM | POA: Diagnosis not present

## 2020-02-09 DIAGNOSIS — J3089 Other allergic rhinitis: Secondary | ICD-10-CM | POA: Diagnosis not present

## 2020-02-09 DIAGNOSIS — G4733 Obstructive sleep apnea (adult) (pediatric): Secondary | ICD-10-CM | POA: Diagnosis not present

## 2020-02-09 DIAGNOSIS — F3341 Major depressive disorder, recurrent, in partial remission: Secondary | ICD-10-CM

## 2020-02-09 DIAGNOSIS — J301 Allergic rhinitis due to pollen: Secondary | ICD-10-CM | POA: Diagnosis not present

## 2020-02-09 DIAGNOSIS — J3081 Allergic rhinitis due to animal (cat) (dog) hair and dander: Secondary | ICD-10-CM | POA: Diagnosis not present

## 2020-02-09 NOTE — Progress Notes (Signed)
[facility]  Patient ID: Beverly Ballard,  MRN: [id number]  PCP: Caren Macadam, MD  Date of Evaluation: 02/09/2020 Time spent:20 minutes  Chief Complaint:  Chief Complaint    Anxiety; Depression      HISTORY/CURRENT STATUS: HPI For routine med check.   Still having anxiety at times but is able to enjoy things.  Is getting more things done around the house. Cleaned up her front room, and is doing crafts too. Made a couple of wreaths earlier this week. Also plans to make her sister something out of handkerchiefs of their dad's. She's trying to make herself do things. Is scared to go out b/c she's fallen several times. She is in PT for leg strengthening. Isolating some but has gone to a store some, like the craft store.  No suicidal or homicidal thoughts.  Patient denies increased energy with decreased need for sleep, no increased talkativeness, no racing thoughts, no impulsivity or risky behaviors, no increased spending, no increased libido, no grandiosity, no increased irritability or anger, no paranoia,and no hallucinations.  Denies dizziness, syncope, seizures, numbness, tingling, tremor, tics, unsteady gait, slurred speech, confusion. Denies muscle or joint pain, stiffness, or dystonia.  Individual Medical History/ Review of Systems: Changes? :No    Past medications for mental health diagnoses include: Paxil, Prozac, Valium  Allergies: Lamotrigine, Bupropion, Carbamazepine, Levetiracetam, Tramadol, Adhesive [tape], Clarithromycin, Doxycycline, Nickel, Other, Topamax [topiramate], Estradiol, Latex, and Phenytoin sodium extended  Current Medications:  Current Outpatient Medications:  .  albuterol (VENTOLIN HFA) 108 (90 Base) MCG/ACT inhaler, Inhale 2 puffs into the lungs every 4 (four) hours as needed for wheezing or shortness of breath., Disp: 18 g, Rfl: 0 .  aspirin 81 MG tablet, Take 81 mg by mouth daily., Disp: , Rfl:  .  b complex vitamins tablet, Take 1  tablet by mouth daily., Disp: , Rfl:  .  beta carotene w/minerals (OCUVITE) tablet, Take 1 tablet by mouth daily., Disp: , Rfl:  .  Cholecalciferol (VITAMIN D3 PO), Take by mouth., Disp: , Rfl:  .  cyclobenzaprine (FLEXERIL) 10 MG tablet, Take 1 tablet (10 mg total) by mouth 2 (two) times daily as needed for muscle spasms., Disp: 30 tablet, Rfl: 0 .  EPINEPHrine 0.3 mg/0.3 mL IJ SOAJ injection, SMARTSIG:Injection As Directed, Disp: , Rfl:  .  esomeprazole (NEXIUM) 20 MG capsule, Take 2 capsules (40 mg total) by mouth daily before breakfast. (Patient taking differently: Take 20 mg by mouth daily before breakfast. ), Disp: 2 capsule, Rfl: 0 .  furosemide (LASIX) 20 MG tablet, TAKE 1 TABLET BY MOUTH IN THE MORNING, Disp: 30 tablet, Rfl: 1 .  levocetirizine (XYZAL) 5 MG tablet, Take 5 mg by mouth daily. , Disp: , Rfl:  .  levothyroxine (SYNTHROID) 88 MCG tablet, Take 1 tablet (88 mcg total) by mouth daily., Disp: 90 tablet, Rfl: 3 .  lisinopril (ZESTRIL) 5 MG tablet, Take 1 tablet (5 mg total) by mouth daily., Disp: 90 tablet, Rfl: 1 .  LORazepam (ATIVAN) 0.5 MG tablet, Take 1 tablet (0.5 mg total) by mouth 4 (four) times daily as needed for anxiety., Disp: 60 tablet, Rfl: 0 .  methocarbamol (ROBAXIN) 500 MG tablet, Take 1 tablet (500 mg total) by mouth 2 (two) times daily as needed., Disp: 20 tablet, Rfl: 0 .  pravastatin (PRAVACHOL) 20 MG tablet, Take 1 tablet (20 mg total) by mouth daily. TAKE 1 TABLET BY MOUTH EVERY DAY, Disp: 90 tablet, Rfl: 1 .  sertraline (ZOLOFT) 100 MG  tablet, Take 2 tablets (200 mg total) by mouth daily., Disp: 180 tablet, Rfl: 0 .  Spacer/Aero-Holding Chambers DEVI, Use as directed with inhaler, Disp: 1 each, Rfl: 0 .  zonisamide (ZONEGRAN) 100 MG capsule, TAKE 2 CAPSULES (200 MG TOTAL) BY MOUTH 2 (TWO) TIMES DAILY., Disp: 120 capsule, Rfl: 0 .  fluticasone (FLONASE) 50 MCG/ACT nasal spray, Place 2 sprays into both nostrils daily. (Patient not taking: Reported on 02/09/2020),  Disp: 16 g, Rfl: 6 .  ondansetron (ZOFRAN ODT) 4 MG disintegrating tablet, Take 1 tablet (4 mg total) by mouth every 6 (six) hours as needed for nausea or vomiting. (Patient not taking: Reported on 02/09/2020), Disp: 30 tablet, Rfl: 3 Medication Side Effects: none  Family Medical/ Social History: Changes? No  MENTAL HEALTH EXAM:  There were no vitals taken for this visit.There is no height or weight on file to calculate BMI.  General Appearance: Casual, Neat, Well Groomed and Obese  Eye Contact:  Good  Speech:  Clear and Coherent  Volume:  Normal  Mood:  Euthymic  Affect:  Appropriate  Thought Process:  Goal Directed  Orientation:  Full (Time, Place, and Person)  Thought Content: Logical   Suicidal Thoughts:  No  Homicidal Thoughts:  No  Memory:  WNL  Judgement:  Good  Insight:  Good  Psychomotor Activity:  Normal  Concentration:  Concentration: Fair and Attention Span: Fair  Recall:  Good  Fund of Knowledge: Good  Language: Good  Assets:  Desire for Improvement  ADL's:  Intact  Cognition: WNL  Prognosis:  Good    DIAGNOSES:    ICD-10-CM   1. Recurrent major depressive disorder, in partial remission ([place])  F33.41   2. Generalized anxiety disorder  F41.1   3. Obstructive sleep apnea  G47.33     Receiving Psychotherapy: No  Has an appt with Trey Paula   RECOMMENDATIONS:  PDMP reviewed. I provided 20 minutes of face to face time during this encounter. I'm glad she's doing some better! Continue Zoloft 100 mg 2 p.o. daily. Continue Ativan 0.5 mg, 1 up to 4 times a day as needed. Continue use of CPAP. Starting therapy with Trey Paula soon. Was seeing Dr. Glennon Hamilton who is out on maternity leave. Return in 2 months.   Donnal Moat, PA-C

## 2020-02-10 ENCOUNTER — Ambulatory Visit: Payer: Medicare HMO | Admitting: Rehabilitative and Restorative Service Providers"

## 2020-02-10 ENCOUNTER — Encounter: Payer: Self-pay | Admitting: Rehabilitative and Restorative Service Providers"

## 2020-02-10 DIAGNOSIS — M25562 Pain in left knee: Secondary | ICD-10-CM | POA: Diagnosis not present

## 2020-02-10 DIAGNOSIS — R2689 Other abnormalities of gait and mobility: Secondary | ICD-10-CM

## 2020-02-10 DIAGNOSIS — M6281 Muscle weakness (generalized): Secondary | ICD-10-CM | POA: Diagnosis not present

## 2020-02-10 DIAGNOSIS — M25561 Pain in right knee: Secondary | ICD-10-CM | POA: Diagnosis not present

## 2020-02-10 DIAGNOSIS — R600 Localized edema: Secondary | ICD-10-CM | POA: Diagnosis not present

## 2020-02-10 DIAGNOSIS — M25662 Stiffness of left knee, not elsewhere classified: Secondary | ICD-10-CM

## 2020-02-10 DIAGNOSIS — G8929 Other chronic pain: Secondary | ICD-10-CM | POA: Diagnosis not present

## 2020-02-10 NOTE — Therapy (Signed)
[facility] [address] [place], [place], [address] Phone: [phone]   Fax:  [phone]  Physical Therapy Treatment  Patient Details  Name: Beverly Ballard MRN: [id number] Date of Birth: 20-Jun-1945 Referring Provider (PT): Aundra Dubin, [place]   Encounter Date: 02/10/2020   PT End of Session - 02/10/20 1516    Visit Number 2    Number of Visits 15    Date for PT Re-Evaluation 04/02/20    Progress Note Due on Visit 10    PT Start Time 9937    PT Stop Time 1554    PT Time Calculation (min) 39 min    Activity Tolerance Patient tolerated treatment well    Behavior During Therapy [facility] for tasks assessed/performed           Past Medical History:  Diagnosis Date  . Allergy   . Anxiety and depression 12/19/2009  . Arthritis   . Cancer (HCC)    vaginal  . Constipation   . Degenerative disorder of eye   . Diverticulosis   . Essential hypertension, benign 06/26/2009  . GERD 05/27/2007   takes Nexium daily  . Heart murmur    yrs ago  . History of gout   . History of migraine many yrs ago  . Hyperlipidemia    takes Pravastatin daily  . Hypothyroidism   . Insomnia    but doesn't take any meds  . LOW BACK PAIN 05/27/2007  . Numbness    left toes   . Osteoporosis   . Seizures ([place])    last 1983, none since then- last seizure 1988 per pt     Past Surgical History:  Procedure Laterality Date  . ADENOIDECTOMY    . BACK SURGERY     lumbar x2  . BRONCHIAL BIOPSY  09/12/2019   Procedure: BRONCHIAL BIOPSIES;  Surgeon: Laurin Coder, MD;  Location: [facility];  Service: Endoscopy;;  . BRONCHIAL WASHINGS  09/12/2019   Procedure: BRONCHIAL WASHINGS;  Surgeon: Laurin Coder, MD;  Location: [facility];  Service: Endoscopy;;  . COLONOSCOPY  2009  . ESOPHAGOGASTRODUODENOSCOPY    . Heel spurs Bilateral   . HEMOSTASIS CONTROL  09/12/2019   Procedure: HEMOSTASIS CONTROL;  Surgeon: Laurin Coder, MD;  Location: [facility];   Service: Endoscopy;;  epi  . KNEE ARTHROPLASTY Left 11/07/2013   Procedure: COMPUTER ASSISTED TOTAL KNEE ARTHROPLASTY;  Surgeon: Marybelle Killings, MD;  Location: [place];  Service: Orthopedics;  Laterality: Left;  Left Total Knee Arthroplasty, Cemented, Computer Assist  . TONSILLECTOMY    . TOTAL KNEE REVISION Left 09/19/2014   Procedure: LEFT TOTAL KNEE REVISION;  Surgeon: Leandrew Koyanagi, MD;  Location: [place];  Service: Orthopedics;  Laterality: Left;  . UPPER GASTROINTESTINAL ENDOSCOPY    . VIDEO BRONCHOSCOPY N/A 09/12/2019   Procedure: VIDEO BRONCHOSCOPY WITH FLUORO;  Surgeon: Laurin Coder, MD;  Location: [facility];  Service: Endoscopy;  Laterality: N/A;    There were no vitals filed for this visit.   Subjective Assessment - 02/10/20 1519    Subjective Pt. arrived stating having pain in both shoulders and arms that was causing her trouble.  Report about knees was stiffness primarily (gotten used to it).    Pertinent History PMH: Lt TKA revision 2016, depression, fatigue, obesity,osteopenia, lumbar surgery X2    Limitations Standing;Walking;Lifting    How long can you stand comfortably? 5 min (more limited by back pain than knee pain she states)    How  long can you walk comfortably? 5 min    Diagnostic tests "XR Lt knee X-rays demonstrate a well-seated prosthesis without complication. XR Rt knee Moderate degenerative changes medial and patellofemoral compartments"    Patient Stated Goals get the pain down, improve function    Currently in Pain? Yes    Pain Location Knee    Pain Orientation Left;Right    Pain Descriptors / Indicators Tightness    Pain Type Chronic pain    Pain Onset More than a month ago    Pain Frequency Intermittent    Aggravating Factors  insidious stiffness                             [facility] Adult PT Treatment/Exercise - 02/10/20 0001      Neuro Re-ed    Neuro Re-ed Details  tandem stance 60 sec x 1 bilateral, church pew ankle strategy x 5        Knee/Hip Exercises: Stretches   Gastroc Stretch 3 reps;30 seconds   incline board      Knee/Hip Exercises: Aerobic   Nustep Lvl 5 10 mins      Knee/Hip Exercises: Seated   Other Seated Knee/Hip Exercises slr 2 x 10 bilateral    Sit to Sand without UE support;10 reps                    PT Short Term Goals - 02/10/20 1520      PT SHORT TERM GOAL #1   Title Pt will be I and compliant with HEP.    Time 4    Period Weeks    Status On-going    Target Date 03/05/20      PT SHORT TERM GOAL #2   Title Pt will improve Lt knee strength to 4/5 MMT    Time 4    Period Weeks    Status On-going             PT Long Term Goals - 02/06/20 1018      PT LONG TERM GOAL #1   Title Pt will improve overall hip/knee strength to 4+ to improve function.    Time 8    Period Weeks    Status New    Target Date 04/02/20      PT LONG TERM GOAL #2   Title Pt will improve 5TSTS test to less than 14 sec to show improved balance and leg endurance    Baseline 17.5    Time 8    Period Weeks    Status New      PT LONG TERM GOAL #3   Title Pt will improve FOTO score to at least 47% functional score    Time 8    Period Weeks    Status New      PT LONG TERM GOAL #4   Title Pt will be able to stand and walk at least 15 minutes with overall 4 or less pain out of 10.    Baseline 7 pain after 5 min    Status New                 Plan - 02/10/20 1531    Clinical Impression Statement Genralized cardiovascular fatigue noted during intervention.  Pt. may continue to benefit from improved knee mobility, strength and balance as well as generalized exercise increase to promote improved progressive mobility and endurance.    Personal Factors and Comorbidities  Comorbidity 3+    Comorbidities PMH: Lt TKA revision 2016, depression, fatigue, obesity,osteopenia, lumbar surgery X2    Examination-Activity Limitations Lift;Stand;Squat;Stairs;Locomotion Level;Bend    Examination-Participation  Restrictions Cleaning;Community Activity;Driving;Shop    Stability/Clinical Decision Making Evolving/Moderate complexity    Rehab Potential Good    PT Frequency 2x / week    PT Duration 8 weeks    PT Treatment/Interventions ADLs/Self Care Home Management;Cryotherapy;Electrical Stimulation;Iontophoresis 4mg /ml Dexamethasone;Moist Heat;Ultrasound;Gait training;Stair training;Therapeutic activities;Therapeutic exercise;Balance training;Neuromuscular re-education;Manual techniques;Passive range of motion;Dry needling;Joint Manipulations;Taping;Vasopneumatic Device    PT Next Visit Plan needs hip/knee strength, ROM, and gentle strength progression. modalaties for pain PRN    PT Home Exercise Plan Access Code: [email or web address]    Consulted and Agree with Plan of Care Patient           Patient will benefit from skilled therapeutic intervention in order to improve the following deficits and impairments:  Decreased activity tolerance, Abnormal gait, Decreased mobility, Decreased strength, Decreased range of motion, Difficulty walking, Impaired flexibility, Increased muscle spasms, Pain, Postural dysfunction, Obesity  Visit Diagnosis: Chronic pain of left knee  Chronic pain of right knee  Stiffness of left knee, not elsewhere classified  Muscle weakness (generalized)  Other abnormalities of gait and mobility  Localized edema     Problem List Patient Active Problem List   Diagnosis Date Noted  . Ankle edema, bilateral 10/31/2019  . Excessive daytime sleepiness 06/28/2019  . Shortness of breath 06/28/2019  . Healthcare maintenance 06/28/2019  . ANA positive 06/28/2019  . Bronchiectasis ([place]) 06/28/2019  . Depression, major, single episode, moderate ([place]) 11/19/2018  . Fatigue 03/23/2018  . Recurrent major depressive disorder, in partial remission ([place]) 05/04/2017  . Left knee pain 01/12/2017  . Ovarian cyst 10/30/2016  . Aortic atherosclerosis ([place]) 11/19/2015  . Hyperlipemia 04/03/2015    . S/P revision of total knee 09/19/2014  . Seizure disorder ([place]) 01/24/2014  . Obesity, BMI unknown 01/24/2014  . Osteopenia, stable on dexa 2010 - followed by gyn 06/15/2012  . Pap smear abnormality of cervix/human papillomavirus (HPV) positive - 09/2011, followed by gyn 06/15/2012  . Essential hypertension 07/03/2009  . Hypothyroidism 05/27/2007  . Allergic rhinitis 05/27/2007  . GERD 05/27/2007   Scot Jun, PT, DPT, OCS, ATC 02/10/20  3:54 PM    [facility] [address] [place], [place], [address] Phone: [phone]   Fax:  [phone]  Name: Beverly Ballard MRN: [id number] Date of Birth: [birth year]

## 2020-02-13 ENCOUNTER — Other Ambulatory Visit: Payer: Self-pay | Admitting: Pulmonary Disease

## 2020-02-14 ENCOUNTER — Encounter: Payer: Self-pay | Admitting: Neurology

## 2020-02-14 ENCOUNTER — Telehealth: Payer: Self-pay | Admitting: *Deleted

## 2020-02-14 ENCOUNTER — Ambulatory Visit: Payer: Medicare HMO | Admitting: Neurology

## 2020-02-14 ENCOUNTER — Other Ambulatory Visit: Payer: Self-pay

## 2020-02-14 VITALS — BP 139/78 | HR 88 | Ht 65.0 in | Wt 250.6 lb

## 2020-02-14 DIAGNOSIS — J301 Allergic rhinitis due to pollen: Secondary | ICD-10-CM | POA: Diagnosis not present

## 2020-02-14 DIAGNOSIS — R251 Tremor, unspecified: Secondary | ICD-10-CM

## 2020-02-14 DIAGNOSIS — G40009 Localization-related (focal) (partial) idiopathic epilepsy and epileptic syndromes with seizures of localized onset, not intractable, without status epilepticus: Secondary | ICD-10-CM | POA: Diagnosis not present

## 2020-02-14 DIAGNOSIS — J3089 Other allergic rhinitis: Secondary | ICD-10-CM | POA: Diagnosis not present

## 2020-02-14 DIAGNOSIS — J3081 Allergic rhinitis due to animal (cat) (dog) hair and dander: Secondary | ICD-10-CM | POA: Diagnosis not present

## 2020-02-14 MED ORDER — ZONISAMIDE 100 MG PO CAPS
ORAL_CAPSULE | ORAL | 3 refills | Status: DC
Start: 2020-02-14 — End: 2020-11-07

## 2020-02-14 NOTE — Patient Instructions (Signed)
1. Continue Zonisamide 100mg : take 2 capsules twice a day  2. Consider Propranolol for anxiety and tremors, please discuss with your psychiatrist and pulmonologist if this is a medication that would be best for you  3. Hang in there, follow-up with psychiatry and therapy  4. Follow-up in 1 year, call for any changes   Seizure Precautions: 1. If medication has been prescribed for you to prevent seizures, take it exactly as directed.  Do not stop taking the medicine without talking to your doctor first, even if you have not had a seizure in a long time.   2. Avoid activities in which a seizure would cause danger to yourself or to others.  Don't operate dangerous machinery, swim alone, or climb in high or dangerous places, such as on ladders, roofs, or girders.  Do not drive unless your doctor says you may.  3. If you have any warning that you may have a seizure, lay down in a safe place where you can't hurt yourself.    4.  No driving for 6 months from last seizure, as per [facility].   Please refer to the following link on the [place] website for more information: http://[email or web address].org/[email or web address]/driving/drivingu.cfm   5.  Maintain good sleep hygiene. Avoid alcohol.  6.  Contact your doctor if you have any problems that may be related to the medicine you are taking.  7.  Call 911 and bring the patient back to the ED if:        A.  The seizure lasts longer than 5 minutes.       B.  The patient doesn't awaken shortly after the seizure  C.  The patient has new problems such as difficulty seeing, speaking or moving  D.  The patient was injured during the seizure  E.  The patient has a temperature over 102 F (39C)  F.  The patient vomited and now is having trouble breathing

## 2020-02-14 NOTE — Telephone Encounter (Signed)
Spoke with the pt and scheduled an appt on 11/29.

## 2020-02-14 NOTE — Progress Notes (Signed)
NEUROLOGY FOLLOW UP OFFICE NOTE  Beverly Ballard [id number] 1945-04-23  HISTORY OF PRESENT ILLNESS: I had the pleasure of seeing Beverly Ballard in follow-up in the neurology clinic on 02/14/2020.  The patient was last seen 3 months ago for seizures. Records and images were personally reviewed where available. She had presented due to concern that Zonisamide was causing her nausea. Her 1-hour EEG in July 2021 was abnormal with frequent bursts of generalized high voltage 3-5 Hz spike and polyspike and wave discharges. She was started on Vimpat, however reported even worse nausea, tongue swelling and was instructed to stop medication. We discussed reducing Zonisamide to 100mg  in AM, 200mg  in PM, however she felt off so she increased back to 200mg  BID. She denies any staring/unresponsive episodes, gaps in time, olfactory/gustatory hallucinations, myoclonic jerks. She "goes into my own world" but responds when alerted. She continues to have nausea, no headaches. She feels a little lightheaded every now and then. She had a fall with no loss of consciousness. Her balance is off, she is doing physical therapy for her knees. She has pain on the right shoulder. She reports being depressed and anxious due to the pandemic.    History on Initial Assessment 10/28/2019: This is a 74 year old right-handed woman with a history of hypertension, hyperlipidemia, hypothyroidism, migraines, vaginal cancer, complex partial seizures, presenting for second opinion regarding seizure medication. Records from her neurologist Dr. Macario Carls were reviewed. Seizures started in childhood then stopped for a few years until she was in her teens. She thinks she had staring episodes, no further staring spells since 1988. She had a seizure in 2014 when she was completely weaned off seizure medication. She recalls she would smell a funny odor prior to a seizure. No nocturnal seizures. She has tried several seizure medications, she could not tolerate  Levetiracetam, Lamotrigine. She had hyponatremia on carbamazepine. Aptiom was cost-prohibitive. She has been taking Zonisamide 200mg  BID since 2017. MRI brain with and without contrast in 2017 was unremarkable, there was a small Rathke's cleft cyst within the pituitary gland measuring 56mm.  She presents for second opinion regarding her seizure medication. She has been having a lot of health problems and has had a "test on every part of my body" with no clear cause found. She has been suffering from nausea for the past 5 years, no vomiting, occasional stomach pain. She is having a lot of drowsiness. She feels sick to her stomach right now and has an acidic taste. She lives with her husband and denies any staring/unresponsive episodes, gaps in time, olfactory/gustatory hallucinations, myoclonic jerks. She has occasional left arm tingling. She has right-sided neck pain and back pain. No headaches, dizziness, vision changes, bowel/bladder dysfunction. Shd a fall 2-3 weeks ago when she fell backward. She is not sure if she passed out, mostly reporting she has not been feeling good for a long time. She had a pneumonia 3 years ago where she had inflammation in her lungs and was not feeling well, she had another bout this year, as well as dealing with depression. She has a CPAP machine which has helped her sleep a little better at night.  Epilepsy Risk Factors:  She had a normal birth and early development.  There is no history of febrile convulsions, CNS infections such as meningitis/encephalitis, significant traumatic brain injury, neurosurgical procedures, or family history of seizures.  Prior AEDs: Levetiracetam, Lamotrigine, carbamazepine  Diagnostic Data: MRI brain with and without contrast in 2017 was unremarkable, there was  a small Rathke's cleft cyst within the pituitary gland measuring 68mm. No prior EEG on file.   PAST MEDICAL HISTORY: Past Medical History:  Diagnosis Date  . Allergy   . Anxiety  and depression 12/19/2009  . Arthritis   . Cancer (HCC)    vaginal  . Constipation   . Degenerative disorder of eye   . Diverticulosis   . Essential hypertension, benign 06/26/2009  . GERD 05/27/2007   takes Nexium daily  . Heart murmur    yrs ago  . History of gout   . History of migraine many yrs ago  . Hyperlipidemia    takes Pravastatin daily  . Hypothyroidism   . Insomnia    but doesn't take any meds  . LOW BACK PAIN 05/27/2007  . Numbness    left toes   . Osteoporosis   . Seizures ([place])    last 1983, none since then- last seizure 1988 per pt     MEDICATIONS: Current Outpatient Medications on File Prior to Visit  Medication Sig Dispense Refill  . albuterol (VENTOLIN HFA) 108 (90 Base) MCG/ACT inhaler Inhale 2 puffs into the lungs every 4 (four) hours as needed for wheezing or shortness of breath. 18 g 0  . aspirin 81 MG tablet Take 81 mg by mouth daily.    Marland Kitchen b complex vitamins tablet Take 1 tablet by mouth daily.    . beta carotene w/minerals (OCUVITE) tablet Take 1 tablet by mouth daily.    . Cholecalciferol (VITAMIN D3 PO) Take by mouth.    . cyclobenzaprine (FLEXERIL) 10 MG tablet Take 1 tablet (10 mg total) by mouth 2 (two) times daily as needed for muscle spasms. 30 tablet 0  . EPINEPHrine 0.3 mg/0.3 mL IJ SOAJ injection SMARTSIG:Injection As Directed    . esomeprazole (NEXIUM) 20 MG capsule Take 2 capsules (40 mg total) by mouth daily before breakfast. (Patient taking differently: Take 20 mg by mouth daily before breakfast. ) 2 capsule 0  . fluticasone (FLONASE) 50 MCG/ACT nasal spray Place 2 sprays into both nostrils daily. (Patient not taking: Reported on 02/09/2020) 16 g 6  . furosemide (LASIX) 20 MG tablet TAKE 1 TABLET BY MOUTH IN THE MORNING 30 tablet 1  . levocetirizine (XYZAL) 5 MG tablet Take 5 mg by mouth daily.     Marland Kitchen levothyroxine (SYNTHROID) 88 MCG tablet Take 1 tablet (88 mcg total) by mouth daily. 90 tablet 3  . lisinopril (ZESTRIL) 5 MG tablet Take 1  tablet (5 mg total) by mouth daily. 90 tablet 1  . LORazepam (ATIVAN) 0.5 MG tablet Take 1 tablet (0.5 mg total) by mouth 4 (four) times daily as needed for anxiety. 60 tablet 0  . methocarbamol (ROBAXIN) 500 MG tablet Take 1 tablet (500 mg total) by mouth 2 (two) times daily as needed. 20 tablet 0  . ondansetron (ZOFRAN ODT) 4 MG disintegrating tablet Take 1 tablet (4 mg total) by mouth every 6 (six) hours as needed for nausea or vomiting. (Patient not taking: Reported on 02/09/2020) 30 tablet 3  . pravastatin (PRAVACHOL) 20 MG tablet Take 1 tablet (20 mg total) by mouth daily. TAKE 1 TABLET BY MOUTH EVERY DAY 90 tablet 1  . sertraline (ZOLOFT) 100 MG tablet Take 2 tablets (200 mg total) by mouth daily. 180 tablet 0  . Spacer/Aero-Holding Dorise Bullion Use as directed with inhaler 1 each 0  . zonisamide (ZONEGRAN) 100 MG capsule TAKE 2 CAPSULES (200 MG TOTAL) BY MOUTH 2 (TWO) TIMES DAILY.  120 capsule 0   No current facility-administered medications on file prior to visit.    ALLERGIES: Allergies  Allergen Reactions  . Lamotrigine Swelling    Tongue swelling  . Bupropion Other (See Comments)    not known  . Carbamazepine Other (See Comments)    Hypnatremia  . Levetiracetam     Other reaction(s): Dizziness (intolerance)  . Tramadol Other (See Comments)    not known  . Adhesive [Tape]     ?  Blister after knee surgery  . Clarithromycin     Unsure of reaction    . Doxycycline     Interfered with seizure medication  . Nickel     Had to remove knee implant with nickel and replace it  . Other     Metal and perfumes  . Topamax [Topiramate] Nausea And Vomiting  . Estradiol Rash  . Latex Rash  . Phenytoin Sodium Extended Other (See Comments)    drowsiness    FAMILY HISTORY: Family History  Problem Relation Age of Onset  . Aortic dissection Father   . Arthritis Mother   . Stroke Mother 68  . Heart murmur Other   . Colon cancer Neg Hx   . Esophageal cancer Neg Hx   . Stomach  cancer Neg Hx   . Rectal cancer Neg Hx   . Colon polyps Neg Hx     SOCIAL HISTORY: Social History   Socioeconomic History  . Marital status: Married    Spouse name: Not on file  . Number of children: 1  . Years of education: Not on file  . Highest education level: Not on file  Occupational History  . Occupation: Retired  Tobacco Use  . Smoking status: Former Smoker    Packs/day: 0.25    Years: 20.00    Pack years: 5.00    Types: Cigarettes    Quit date: 09/08/1978    Years since quitting: 41.4  . Smokeless tobacco: Never Used  . Tobacco comment: quit smoking 84yrs ago  Vaping Use  . Vaping Use: Never used  Substance and Sexual Activity  . Alcohol use: No  . Drug use: No  . Sexual activity: Never    Birth control/protection: Post-menopausal  Other Topics Concern  . Not on file  Social History Narrative   Right handed    Lives with husband    Social Determinants of Health   Financial Resource Strain: Low Risk   . Difficulty of Paying Living Expenses: Not hard at all  Food Insecurity: No Food Insecurity  . Worried About Charity fundraiser in the Last Year: Never true  . Ran Out of Food in the Last Year: Never true  Transportation Needs: No Transportation Needs  . Lack of Transportation (Medical): No  . Lack of Transportation (Non-Medical): No  Physical Activity: Inactive  . Days of Exercise per Week: 0 days  . Minutes of Exercise per Session: 0 min  Stress: Stress Concern Present  . Feeling of Stress : To some extent  Social Connections: Socially Isolated  . Frequency of Communication with Friends and Family: Twice a week  . Frequency of Social Gatherings with Friends and Family: Never  . Attends Religious Services: Never  . Active Member of Clubs or Organizations: No  . Attends Archivist Meetings: Never  . Marital Status: Married  Human resources officer Violence: Not At Risk  . Fear of Current or Ex-Partner: No  . Emotionally Abused: No  . Physically  Abused: No  .  Sexually Abused: No     PHYSICAL EXAM: Vitals:   02/14/20 1402  BP: 139/78  Pulse: 88  SpO2: 93%   General: No acute distress Head:  Normocephalic/atraumatic Skin/Extremities: No rash, no edema Neurological Exam: alert and oriented to person, place, and time. No aphasia or dysarthria. Fund of knowledge is appropriate.  Recent and remote memory are intact.  Attention and concentration are normal.   Cranial nerves: Pupils equal, round. Extraocular movements intact with no nystagmus. Visual fields full.  No facial asymmetry.  Motor: Bulk and tone normal, muscle strength 5/5 throughout with no pronator drift.   Finger to nose testing intact.  Gait wide-based, no ataxia. No resting tremor. +bilateral postural and endpoint tremor L>R   IMPRESSION: This is a 74 yo RH woman with a history of hypertension, hyperlipidemia, hypothyroidism, migraines, vaginal cancer, complex partial seizures, seizure-free since 2014, who continues to have constant nausea. Unclear if Zonisamide is the culprit, we tried to switch to Vimpat however she did not tolerate this and would like to continue on Zonisamide 200mg  BID for now. She is aware of [place] driving laws to stop driving after a seizure, until 6 months seizure-free. We discussed tremors suggestive of essential tremor. She will discuss the option of Propranolol with her psychiatrist and pulmonologist. Continue follow-up with [facility]. Follow-up in 1 year, she knows to call for any changes.    Thank you for allowing me to participate in her care.  Please do not hesitate to call for any questions or concerns.   Ellouise Newer, M.D.   CC: Dr. Ethlyn Gallery

## 2020-02-14 NOTE — Telephone Encounter (Signed)
-----   Message from Caren Macadam, MD sent at 01/31/2020  2:38 PM EDT -----   ----- Message ----- From: Laurin Coder, MD Sent: 12/30/2019   2:05 PM EDT To: Caren Macadam, MD

## 2020-02-16 ENCOUNTER — Other Ambulatory Visit: Payer: Self-pay | Admitting: Pulmonary Disease

## 2020-02-21 ENCOUNTER — Other Ambulatory Visit: Payer: Self-pay

## 2020-02-21 ENCOUNTER — Ambulatory Visit (INDEPENDENT_AMBULATORY_CARE_PROVIDER_SITE_OTHER): Payer: Medicare HMO | Admitting: Psychology

## 2020-02-21 ENCOUNTER — Ambulatory Visit: Payer: Medicare HMO | Admitting: Rheumatology

## 2020-02-21 ENCOUNTER — Encounter: Payer: Self-pay | Admitting: Rheumatology

## 2020-02-21 VITALS — BP 137/74 | HR 73 | Resp 16 | Ht 65.0 in | Wt 254.4 lb

## 2020-02-21 DIAGNOSIS — F331 Major depressive disorder, recurrent, moderate: Secondary | ICD-10-CM

## 2020-02-21 DIAGNOSIS — K219 Gastro-esophageal reflux disease without esophagitis: Secondary | ICD-10-CM

## 2020-02-21 DIAGNOSIS — G8929 Other chronic pain: Secondary | ICD-10-CM

## 2020-02-21 DIAGNOSIS — M8589 Other specified disorders of bone density and structure, multiple sites: Secondary | ICD-10-CM | POA: Diagnosis not present

## 2020-02-21 DIAGNOSIS — M19041 Primary osteoarthritis, right hand: Secondary | ICD-10-CM

## 2020-02-21 DIAGNOSIS — M19071 Primary osteoarthritis, right ankle and foot: Secondary | ICD-10-CM | POA: Diagnosis not present

## 2020-02-21 DIAGNOSIS — Z87891 Personal history of nicotine dependence: Secondary | ICD-10-CM

## 2020-02-21 DIAGNOSIS — R768 Other specified abnormal immunological findings in serum: Secondary | ICD-10-CM

## 2020-02-21 DIAGNOSIS — M19072 Primary osteoarthritis, left ankle and foot: Secondary | ICD-10-CM

## 2020-02-21 DIAGNOSIS — M1711 Unilateral primary osteoarthritis, right knee: Secondary | ICD-10-CM | POA: Diagnosis not present

## 2020-02-21 DIAGNOSIS — M25562 Pain in left knee: Secondary | ICD-10-CM

## 2020-02-21 DIAGNOSIS — J679 Hypersensitivity pneumonitis due to unspecified organic dust: Secondary | ICD-10-CM

## 2020-02-21 DIAGNOSIS — Z96652 Presence of left artificial knee joint: Secondary | ICD-10-CM

## 2020-02-21 DIAGNOSIS — I10 Essential (primary) hypertension: Secondary | ICD-10-CM | POA: Diagnosis not present

## 2020-02-21 DIAGNOSIS — M51369 Other intervertebral disc degeneration, lumbar region without mention of lumbar back pain or lower extremity pain: Secondary | ICD-10-CM

## 2020-02-21 DIAGNOSIS — F3341 Major depressive disorder, recurrent, in partial remission: Secondary | ICD-10-CM

## 2020-02-21 DIAGNOSIS — E785 Hyperlipidemia, unspecified: Secondary | ICD-10-CM

## 2020-02-21 DIAGNOSIS — E890 Postprocedural hypothyroidism: Secondary | ICD-10-CM

## 2020-02-21 DIAGNOSIS — G4719 Other hypersomnia: Secondary | ICD-10-CM

## 2020-02-21 DIAGNOSIS — R69 Illness, unspecified: Secondary | ICD-10-CM | POA: Diagnosis not present

## 2020-02-21 DIAGNOSIS — G40909 Epilepsy, unspecified, not intractable, without status epilepticus: Secondary | ICD-10-CM

## 2020-02-21 DIAGNOSIS — G4733 Obstructive sleep apnea (adult) (pediatric): Secondary | ICD-10-CM

## 2020-02-21 DIAGNOSIS — R5383 Other fatigue: Secondary | ICD-10-CM

## 2020-02-21 DIAGNOSIS — M5030 Other cervical disc degeneration, unspecified cervical region: Secondary | ICD-10-CM | POA: Diagnosis not present

## 2020-02-21 DIAGNOSIS — M5136 Other intervertebral disc degeneration, lumbar region: Secondary | ICD-10-CM | POA: Diagnosis not present

## 2020-02-21 DIAGNOSIS — F321 Major depressive disorder, single episode, moderate: Secondary | ICD-10-CM

## 2020-02-21 DIAGNOSIS — M19042 Primary osteoarthritis, left hand: Secondary | ICD-10-CM

## 2020-02-21 DIAGNOSIS — I700 Atherosclerosis of aorta: Secondary | ICD-10-CM

## 2020-02-21 DIAGNOSIS — J309 Allergic rhinitis, unspecified: Secondary | ICD-10-CM

## 2020-02-21 DIAGNOSIS — R7689 Other specified abnormal immunological findings in serum: Secondary | ICD-10-CM

## 2020-02-21 NOTE — Patient Instructions (Signed)
Cervical Strain and Sprain Rehab Ask your health care provider which exercises are safe for you. Do exercises exactly as told by your health care provider and adjust them as directed. It is normal to feel mild stretching, pulling, tightness, or discomfort as you do these exercises. Stop right away if you feel sudden pain or your pain gets worse. Do not begin these exercises until told by your health care provider. Stretching and range-of-motion exercises Cervical side bending  1. Using good posture, sit on a stable chair or stand up. 2. Without moving your shoulders, slowly tilt your left / right ear to your shoulder until you feel a stretch in the opposite side neck muscles. You should be looking straight ahead. 3. Hold for __________ seconds. 4. Repeat with the other side of your neck. Repeat __________ times. Complete this exercise __________ times a day. Cervical rotation  1. Using good posture, sit on a stable chair or stand up. 2. Slowly turn your head to the side as if you are looking over your left / right shoulder. ? Keep your eyes level with the ground. ? Stop when you feel a stretch along the side and the back of your neck. 3. Hold for __________ seconds. 4. Repeat this by turning to your other side. Repeat __________ times. Complete this exercise __________ times a day. Thoracic extension and pectoral stretch 1. Roll a towel or a small blanket so it is about 4 inches (10 cm) in diameter. 2. Lie down on your back on a firm surface. 3. Put the towel lengthwise, under your spine in the middle of your back. It should not be under your shoulder blades. The towel should line up with your spine from your middle back to your lower back. 4. Put your hands behind your head and let your elbows fall out to your sides. 5. Hold for __________ seconds. Repeat __________ times. Complete this exercise __________ times a day. Strengthening exercises Isometric upper cervical flexion 1. Lie on  your back with a thin pillow behind your head and a small rolled-up towel under your neck. 2. Gently tuck your chin toward your chest and nod your head down to look toward your feet. Do not lift your head off the pillow. 3. Hold for __________ seconds. 4. Release the tension slowly. Relax your neck muscles completely before you repeat this exercise. Repeat __________ times. Complete this exercise __________ times a day. Isometric cervical extension  1. Stand about 6 inches (15 cm) away from a wall, with your back facing the wall. 2. Place a soft object, about 6-8 inches (15-20 cm) in diameter, between the back of your head and the wall. A soft object could be a small pillow, a ball, or a folded towel. 3. Gently tilt your head back and press into the soft object. Keep your jaw and forehead relaxed. 4. Hold for __________ seconds. 5. Release the tension slowly. Relax your neck muscles completely before you repeat this exercise. Repeat __________ times. Complete this exercise __________ times a day. Posture and body mechanics Body mechanics refers to the movements and positions of your body while you do your daily activities. Posture is part of body mechanics. Good posture and healthy body mechanics can help to relieve stress in your body's tissues and joints. Good posture means that your spine is in its natural S-curve position (your spine is neutral), your shoulders are pulled back slightly, and your head is not tipped forward. The following are general guidelines for applying improved   posture and body mechanics to your everyday activities. Sitting  1. When sitting, keep your spine neutral and keep your feet flat on the floor. Use a footrest, if necessary, and keep your thighs parallel to the floor. Avoid rounding your shoulders, and avoid tilting your head forward. 2. When working at a desk or a computer, keep your desk at a height where your hands are slightly lower than your elbows. Slide your  chair under your desk so you are close enough to maintain good posture. 3. When working at a computer, place your monitor at a height where you are looking straight ahead and you do not have to tilt your head forward or downward to look at the screen. Standing   When standing, keep your spine neutral and keep your feet about hip-width apart. Keep a slight bend in your knees. Your ears, shoulders, and hips should line up.  When you do a task in which you stand in one place for a long time, place one foot up on a stable object that is 2-4 inches (5-10 cm) high, such as a footstool. This helps keep your spine neutral. Resting When lying down and resting, avoid positions that are most painful for you. Try to support your neck in a neutral position. You can use a contour pillow or a small rolled-up towel. Your pillow should support your neck but not push on it. This information is not intended to replace advice given to you by your health care provider. Make sure you discuss any questions you have with your health care provider. Document Revised: 07/28/2018 Document Reviewed: 01/06/2018 Elsevier Patient Education  2020 [employer]  Back Exercises The following exercises strengthen the muscles that help to support the trunk and back. They also help to keep the lower back flexible. Doing these exercises can help to prevent back pain or lessen existing pain.  If you have back pain or discomfort, try doing these exercises 2-3 times each day or as told by your health care provider.  As your pain improves, do them once each day, but increase the number of times that you repeat the steps for each exercise (do more repetitions).  To prevent the recurrence of back pain, continue to do these exercises once each day or as told by your health care provider. Do exercises exactly as told by your health care provider and adjust them as directed. It is normal to feel mild stretching, pulling, tightness, or  discomfort as you do these exercises, but you should stop right away if you feel sudden pain or your pain gets worse. Exercises Single knee to chest Repeat these steps 3-5 times for each leg: 1. Lie on your back on a firm bed or the floor with your legs extended. 2. Bring one knee to your chest. Your other leg should stay extended and in contact with the floor. 3. Hold your knee in place by grabbing your knee or thigh with both hands and hold. 4. Pull on your knee until you feel a gentle stretch in your lower back or buttocks. 5. Hold the stretch for 10-30 seconds. 6. Slowly release and straighten your leg. Pelvic tilt Repeat these steps 5-10 times: 1. Lie on your back on a firm bed or the floor with your legs extended. 2. Bend your knees so they are pointing toward the ceiling and your feet are flat on the floor. 3. Tighten your lower abdominal muscles to press your lower back against the floor. This motion will   tilt your pelvis so your tailbone points up toward the ceiling instead of pointing to your feet or the floor. 4. With gentle tension and even breathing, hold this position for 5-10 seconds. Cat-cow Repeat these steps until your lower back becomes more flexible: 1. Get into a hands-and-knees position on a firm surface. Keep your hands under your shoulders, and keep your knees under your hips. You may place padding under your knees for comfort. 2. Let your head hang down toward your chest. Contract your abdominal muscles and point your tailbone toward the floor so your lower back becomes rounded like the back of a cat. 3. Hold this position for 5 seconds. 4. Slowly lift your head, let your abdominal muscles relax and point your tailbone up toward the ceiling so your back forms a sagging arch like the back of a cow. 5. Hold this position for 5 seconds.  Press-ups Repeat these steps 5-10 times: 1. Lie on your abdomen (face-down) on the floor. 2. Place your palms near your head, about  shoulder-width apart. 3. Keeping your back as relaxed as possible and keeping your hips on the floor, slowly straighten your arms to raise the top half of your body and lift your shoulders. Do not use your back muscles to raise your upper torso. You may adjust the placement of your hands to make yourself more comfortable. 4. Hold this position for 5 seconds while you keep your back relaxed. 5. Slowly return to lying flat on the floor.  Bridges Repeat these steps 10 times: 1. Lie on your back on a firm surface. 2. Bend your knees so they are pointing toward the ceiling and your feet are flat on the floor. Your arms should be flat at your sides, next to your body. 3. Tighten your buttocks muscles and lift your buttocks off the floor until your waist is at almost the same height as your knees. You should feel the muscles working in your buttocks and the back of your thighs. If you do not feel these muscles, slide your feet 1-2 inches farther away from your buttocks. 4. Hold this position for 3-5 seconds. 5. Slowly lower your hips to the starting position, and allow your buttocks muscles to relax completely. If this exercise is too easy, try doing it with your arms crossed over your chest. Abdominal crunches Repeat these steps 5-10 times: 1. Lie on your back on a firm bed or the floor with your legs extended. 2. Bend your knees so they are pointing toward the ceiling and your feet are flat on the floor. 3. Cross your arms over your chest. 4. Tip your chin slightly toward your chest without bending your neck. 5. Tighten your abdominal muscles and slowly raise your trunk (torso) high enough to lift your shoulder blades a tiny bit off the floor. Avoid raising your torso higher than that because it can put too much stress on your low back and does not help to strengthen your abdominal muscles. 6. Slowly return to your starting position. Back lifts Repeat these steps 5-10 times: 1. Lie on your abdomen  (face-down) with your arms at your sides, and rest your forehead on the floor. 2. Tighten the muscles in your legs and your buttocks. 3. Slowly lift your chest off the floor while you keep your hips pressed to the floor. Keep the back of your head in line with the curve in your back. Your eyes should be looking at the floor. 4. Hold this position for   3-5 seconds. 5. Slowly return to your starting position. Contact a health care provider if:  Your back pain or discomfort gets much worse when you do an exercise.  Your worsening back pain or discomfort does not lessen within 2 hours after you exercise. If you have any of these problems, stop doing these exercises right away. Do not do them again unless your health care provider says that you can. Get help right away if:  You develop sudden, severe back pain. If this happens, stop doing the exercises right away. Do not do them again unless your health care provider says that you can. This information is not intended to replace advice given to you by your health care provider. Make sure you discuss any questions you have with your health care provider. Document Revised: 08/12/2018 Document Reviewed: 01/07/2018 Elsevier Patient Education  2020 [employer] Inc[employer]  

## 2020-02-21 NOTE — Addendum Note (Signed)
Addended by: Shona Needles on: 02/21/2020 11:46 AM   Modules accepted: Orders

## 2020-02-21 NOTE — Progress Notes (Signed)
Office Visit Note  Patient: Beverly Ballard             Date of Birth: 1945-08-20           MRN: [id number]             PCP: Caren Macadam, MD Referring: Caren Macadam, MD Visit Date: 02/21/2020 Occupation: _0 @  Subjective:  New Patient (Initial Visit) (Abnormal las)   History of Present Illness: Beverly Ballard is a 74 y.o. female seen in consultation per request of her PCP.  According the patient she has had history of neck and lower back pain for many years.  She also has history of pain in her knee joints for many years.  She had initial left total knee replacement by Dr. Lorin Mercy and then a revision of her left total knee replacement by Dr. Erlinda Hong later.  She continues to have discomfort in her bilateral knee joints.  She has been diagnosed with right knee joint osteoarthritis.  None of the other joints are painful she denies any history of joint swelling.  She has been seen by pulmonologist and has been diagnosed with hypersensitivity pneumonitis.  In March she had lab work which showed borderline positive ANA.  She has been referred to me for evaluation of positive ANA.  She denies any history of oral ulcers, nasal ulcers, malar rash, photosensitivity, raynauds phenomenon or lymphadenopathy or inflammatory arthritis.  History of autoimmune disease.  Activities of Daily Living:  Patient reports morning stiffness for 10 minutes.   Patient Denies nocturnal pain.  Difficulty dressing/grooming: Reports Difficulty climbing stairs: Reports Difficulty getting out of chair: Reports Difficulty using hands for taps, buttons, cutlery, and/or writing: Denies  Review of Systems  Constitutional: Positive for fatigue. Negative for night sweats, weight gain and weight loss.  HENT: Positive for mouth dryness. Negative for mouth sores, trouble swallowing, trouble swallowing and nose dryness.   Eyes: Negative for pain, redness, visual disturbance and dryness.  Respiratory: Positive for  shortness of breath and difficulty breathing. Negative for cough.   Cardiovascular: Negative for chest pain, palpitations, hypertension, irregular heartbeat and swelling in legs/feet.  Gastrointestinal: Positive for constipation. Negative for blood in stool and diarrhea.  Endocrine: Negative for increased urination.  Genitourinary: Negative for vaginal dryness.  Musculoskeletal: Positive for arthralgias, joint pain and morning stiffness. Negative for joint swelling, myalgias, muscle weakness, muscle tenderness and myalgias.  Skin: Negative for color change, rash, hair loss, skin tightness, ulcers and sensitivity to sunlight.  Allergic/Immunologic: Negative for susceptible to infections.  Neurological: Negative for dizziness, memory loss, night sweats and weakness.  Hematological: Negative for swollen glands.  Psychiatric/Behavioral: Positive for depressed mood and sleep disturbance. The patient is not nervous/anxious.     PMFS History:  Patient Active Problem List   Diagnosis Date Noted  . Ankle edema, bilateral 10/31/2019  . Excessive daytime sleepiness 06/28/2019  . Shortness of breath 06/28/2019  . Healthcare maintenance 06/28/2019  . ANA positive 06/28/2019  . Bronchiectasis ([place]) 06/28/2019  . Depression, major, single episode, moderate ([place]) 11/19/2018  . Fatigue 03/23/2018  . Recurrent major depressive disorder, in partial remission ([place]) 05/04/2017  . Left knee pain 01/12/2017  . Ovarian cyst 10/30/2016  . Aortic atherosclerosis ([place]) 11/19/2015  . Hyperlipemia 04/03/2015  . S/P revision of total knee 09/19/2014  . Seizure disorder ([place]) 01/24/2014  . Obesity, BMI unknown 01/24/2014  . Osteopenia, stable on dexa 2010 - followed by gyn 06/15/2012  . Pap smear abnormality of cervix/human  papillomavirus (HPV) positive - 09/2011, followed by gyn 06/15/2012  . Essential hypertension 07/03/2009  . Hypothyroidism 05/27/2007  . Allergic rhinitis 05/27/2007  . GERD 05/27/2007      Past Medical History:  Diagnosis Date  . Allergy   . Anxiety and depression 12/19/2009  . Arthritis   . Cancer (HCC)    vaginal  . Constipation   . Degenerative disorder of eye   . Diverticulosis   . Essential hypertension, benign 06/26/2009  . GERD 05/27/2007   takes Nexium daily  . Heart murmur    yrs ago  . History of gout   . History of migraine many yrs ago  . Hyperlipidemia    takes Pravastatin daily  . Hypothyroidism   . Insomnia    but doesn't take any meds  . LOW BACK PAIN 05/27/2007  . Numbness    left toes   . Osteoporosis   . Seizures ([place])    last 1983, none since then- last seizure 1988 per pt     Family History  Problem Relation Age of Onset  . Aortic dissection Father   . Arthritis Mother   . Stroke Mother 64  . Heart murmur Other   . Throat cancer Sister   . Colon cancer Neg Hx   . Esophageal cancer Neg Hx   . Stomach cancer Neg Hx   . Rectal cancer Neg Hx   . Colon polyps Neg Hx    Past Surgical History:  Procedure Laterality Date  . ADENOIDECTOMY    . BACK SURGERY     lumbar x2  . BRONCHIAL BIOPSY  09/12/2019   Procedure: BRONCHIAL BIOPSIES;  Surgeon: Laurin Coder, MD;  Location: [facility];  Service: Endoscopy;;  . BRONCHIAL WASHINGS  09/12/2019   Procedure: BRONCHIAL WASHINGS;  Surgeon: Laurin Coder, MD;  [facility];  Service: Endoscopy;;  . COLONOSCOPY  2009  . ESOPHAGOGASTRODUODENOSCOPY    . Heel spurs Bilateral   . HEMOSTASIS CONTROL  09/12/2019   Procedure: HEMOSTASIS CONTROL;  Surgeon: Laurin Coder, MD;  [facility];  Service: Endoscopy;;  epi  . KNEE ARTHROPLASTY Left 11/07/2013   Procedure: COMPUTER ASSISTED TOTAL KNEE ARTHROPLASTY;  Surgeon: Marybelle Killings, MD;  Location: [place];  Service: Orthopedics;  Laterality: Left;  Left Total Knee Arthroplasty, Cemented, Computer Assist  . TONSILLECTOMY    . TOTAL KNEE REVISION Left 09/19/2014   Procedure: LEFT TOTAL KNEE REVISION;  Surgeon: Leandrew Koyanagi, MD;   Location: [place];  Service: Orthopedics;  Laterality: Left;  . UPPER GASTROINTESTINAL ENDOSCOPY    . VIDEO BRONCHOSCOPY N/A 09/12/2019   Procedure: VIDEO BRONCHOSCOPY WITH FLUORO;  Surgeon: Laurin Coder, MD;  [facility];  Service: Endoscopy;  Laterality: N/A;   Social History   Social History Narrative   Right handed    Lives with husband    Immunization History  Administered Date(s) Administered  . Fluad Quad(high Dose 65+) 01/27/2019, 01/11/2020  . Influenza Whole 01/19/2009, 12/19/2009  . Influenza, High Dose Seasonal PF 01/08/2015, 01/03/2016, 01/01/2017, 01/28/2018  . Influenza,inj,Quad PF,6+ Mos 01/24/2014  . Influenza-Unspecified 01/06/2012  . PFIZER SARS-COV-2 Vaccination 07/02/2019, 07/23/2019  . Pneumococcal Conjugate-13 09/19/2009  . Pneumococcal Polysaccharide-23 06/20/2013  . Tdap 05/30/2014  . Varicella 09/23/2010     Objective: Vital Signs: BP 137/74 (BP Location: Right Arm, Patient Position: Sitting, Cuff Size: Normal)   Pulse 73   Resp 16   Ht $R'5\' 5"'kV$  (1.651 m)   Wt 254 lb 6.4  oz (115.4 kg)   BMI 42.33 kg/m    Physical Exam Vitals and nursing note reviewed.  Constitutional:      Appearance: She is well-developed.  HENT:     Head: Normocephalic and atraumatic.  Eyes:     Conjunctiva/sclera: Conjunctivae normal.  Cardiovascular:     Rate and Rhythm: Normal rate and regular rhythm.     Heart sounds: Normal heart sounds.  Pulmonary:     Effort: Pulmonary effort is normal.     Breath sounds: Normal breath sounds.  Abdominal:     General: Bowel sounds are normal.     Palpations: Abdomen is soft.  Musculoskeletal:     Cervical back: Normal range of motion.  Lymphadenopathy:     Cervical: No cervical adenopathy.  Skin:    General: Skin is warm and dry.     Capillary Refill: Capillary refill takes less than 2 seconds.  Neurological:     Mental Status: She is alert and oriented to person, place, and time.  Psychiatric:         Behavior: Behavior normal.      Musculoskeletal Exam: She is good range of motion of her cervical spine with some stiffness.  She has painful range of motion for lumbar spine.  Shoulder joints, elbow joints, wrist joints with good range of motion.  She had no synovitis of her wrist joints MCPs or PIPs.  She has some DIP thickening consistent with osteoarthritis.  Hip joints with good range of motion.  Her left knee joint is replaced.  Right knee joint had no warmth or swelling.  She had mild pedal edema.  There was no tenderness over ankles.  She had bilateral pes cavus and hammertoes.  CDAI Exam: CDAI Score: -- Patient Global: --; Provider Global: -- Swollen: --; Tender: -- Joint Exam 02/21/2020   No joint exam has been documented for this visit   There is currently no information documented on the homunculus. Go to the Rheumatology activity and complete the homunculus joint exam.  Investigation: No additional findings.  Imaging: XR Ankle Complete Right  Result Date: 01/27/2020 No acute or structural abnormalities  XR KNEE 3 VIEW LEFT  Result Date: 01/27/2020 X-rays demonstrate a well-seated prosthesis without complication  XR KNEE 3 VIEW RIGHT  Result Date: 01/27/2020 Moderate degenerative changes medial and patellofemoral compartments   Recent Labs: Lab Results  Component Value Date   WBC 11.3 (H) 12/20/2019   HGB 13.4 12/20/2019   PLT 226 12/20/2019   NA 138 12/20/2019   K 4.4 12/20/2019   CL 101 12/20/2019   CO2 28 12/20/2019   GLUCOSE 108 (H) 12/20/2019   BUN 11 12/20/2019   CREATININE 0.94 12/20/2019   BILITOT 0.4 09/09/2019   ALKPHOS 93 09/09/2019   AST 16 09/09/2019   ALT 11 09/09/2019   PROT 7.5 09/09/2019   ALBUMIN 4.1 09/09/2019   CALCIUM 8.9 12/20/2019   GFRAA >60 12/20/2019  June 24, 2019 ANA 1: 40 cytoplasmic, ESR 34, vitamin D normal, vitamin B12 normal, TSH normal, CK 28  Speciality Comments: No specialty comments available.  Procedures:  No  procedures performed Allergies: Lamotrigine, Bupropion, Carbamazepine, Levetiracetam, Tramadol, Adhesive [tape], Clarithromycin, Doxycycline, Nickel, Other, Topamax [topiramate], Estradiol, Latex, and Phenytoin sodium extended   Assessment / Plan:     Visit Diagnoses: ANA positive-ANA was only 1: 40, not significant.  She had no other clinical features of autoimmune disease.  She denies any history of oral ulcers, nasal ulcers, malar rash, photosensitivity or  Raynaud's phenomenon.  There is no inflammation in her joints on my examination today.  Her sed rate was mildly elevated which was is significant.  Primary osteoarthritis of both hands-she has bilateral DIP thickening.  Joint protection was discussed.  Patient was no strengthening was discussed.  Primary osteoarthritis of right knee - Osteoarthritis and chondromalacia patella.  X-rays done by Dr.Xu were reviewed.  Status post total knee replacement, left - She will total knee replacement by Dr. Lorin Mercy, revision by Dr. Erlinda Hong.  She states she has chronic knee joint discomfort.  Chronic pain of left knee-she continues to have pain in the left knee joint and has difficulty with mobility.  Primary osteoarthritis of both feet-she has severe osteoarthritis in her feet with the bilateral pes cavus and hammertoes.  I'll refer her to podiatrist.  DDD (degenerative disc disease), cervical-I reviewed her x-rays from February 2019 of cervical spine which were consistent with degenerative disc disease.  She declined physical therapy.  I have given her a handout on exercises.  DDD (degenerative disc disease), lumbar-status post fusion.  She is in chronic pain.  A handout on exercises was given.  Other medical problems are listed as follows:  Osteopenia of multiple sites  Essential hypertension  Aortic atherosclerosis (HCC)  Hyperlipidemia, unspecified hyperlipidemia type  Postablative hypothyroidism  Recurrent major depressive disorder, in partial  remission (HCC)  Seizure disorder (HCC)  Gastroesophageal reflux disease, unspecified whether esophagitis present  Other fatigue  Depression, major, single episode, moderate (HCC)  Allergic rhinitis, unspecified seasonality, unspecified trigger  Hypersensitivity pneumonitis ([place]) - Followed by Dr. Ander Slade.  I also reviewed her high-resolution CT and records from her pulmonologist.  Dr. Ander Slade feels that her shortness of breath is multifactorial.  Former smoker  Obstructive sleep apnea - On CPAP  Excessive daytime sleepiness  Orders: No orders of the defined types were placed in this encounter.  No orders of the defined types were placed in this encounter.   Follow-Up Instructions: Return if symptoms worsen or fail to improve, for Osteoarthritis.   Bo Merino, MD  Note - This record has been created using Editor, commissioning.  Chart creation errors have been sought, but may not always  have been located. Such creation errors do not reflect on  the standard of medical care.

## 2020-02-22 ENCOUNTER — Ambulatory Visit: Payer: Medicare HMO | Admitting: Physical Therapy

## 2020-02-22 ENCOUNTER — Other Ambulatory Visit: Payer: Self-pay

## 2020-02-22 DIAGNOSIS — M25561 Pain in right knee: Secondary | ICD-10-CM

## 2020-02-22 DIAGNOSIS — G8929 Other chronic pain: Secondary | ICD-10-CM

## 2020-02-22 DIAGNOSIS — R600 Localized edema: Secondary | ICD-10-CM

## 2020-02-22 DIAGNOSIS — R2689 Other abnormalities of gait and mobility: Secondary | ICD-10-CM | POA: Diagnosis not present

## 2020-02-22 DIAGNOSIS — M6281 Muscle weakness (generalized): Secondary | ICD-10-CM

## 2020-02-22 DIAGNOSIS — M25662 Stiffness of left knee, not elsewhere classified: Secondary | ICD-10-CM | POA: Diagnosis not present

## 2020-02-22 DIAGNOSIS — M25562 Pain in left knee: Secondary | ICD-10-CM

## 2020-02-22 NOTE — Therapy (Signed)
[facility] [address] [place], [place], [address] Phone: [phone]   Fax:  [phone]  Physical Therapy Treatment  Patient Details  Name: Beverly Ballard MRN: [id number] Date of Birth: 1945-09-06 Referring Provider (PT): Aundra Dubin, [place]   Encounter Date: 02/22/2020   PT End of Session - 02/22/20 1307    Visit Number 3    Number of Visits 15    Date for PT Re-Evaluation 04/02/20    Progress Note Due on Visit 10    PT Start Time 5009    PT Stop Time 1225    PT Time Calculation (min) 40 min    Activity Tolerance Patient tolerated treatment well    Behavior During Therapy [facility] for tasks assessed/performed           Past Medical History:  Diagnosis Date  . Allergy   . Anxiety and depression 12/19/2009  . Arthritis   . Cancer (HCC)    vaginal  . Constipation   . Degenerative disorder of eye   . Diverticulosis   . Essential hypertension, benign 06/26/2009  . GERD 05/27/2007   takes Nexium daily  . Heart murmur    yrs ago  . History of gout   . History of migraine many yrs ago  . Hyperlipidemia    takes Pravastatin daily  . Hypothyroidism   . Insomnia    but doesn't take any meds  . LOW BACK PAIN 05/27/2007  . Numbness    left toes   . Osteoporosis   . Seizures ([place])    last 1983, none since then- last seizure 1988 per pt     Past Surgical History:  Procedure Laterality Date  . ADENOIDECTOMY    . BACK SURGERY     lumbar x2  . BRONCHIAL BIOPSY  09/12/2019   Procedure: BRONCHIAL BIOPSIES;  Surgeon: Laurin Coder, MD;  [facility];  Service: Endoscopy;;  . BRONCHIAL WASHINGS  09/12/2019   Procedure: BRONCHIAL WASHINGS;  Surgeon: Laurin Coder, MD;  [facility];  Service: Endoscopy;;  . COLONOSCOPY  2009  . ESOPHAGOGASTRODUODENOSCOPY    . Heel spurs Bilateral   . HEMOSTASIS CONTROL  09/12/2019   Procedure: HEMOSTASIS CONTROL;  Surgeon: Laurin Coder, MD;  [facility];   Service: Endoscopy;;  epi  . KNEE ARTHROPLASTY Left 11/07/2013   Procedure: COMPUTER ASSISTED TOTAL KNEE ARTHROPLASTY;  Surgeon: Marybelle Killings, MD;  Location: [place];  Service: Orthopedics;  Laterality: Left;  Left Total Knee Arthroplasty, Cemented, Computer Assist  . TONSILLECTOMY    . TOTAL KNEE REVISION Left 09/19/2014   Procedure: LEFT TOTAL KNEE REVISION;  Surgeon: Leandrew Koyanagi, MD;  Location: [place];  Service: Orthopedics;  Laterality: Left;  . UPPER GASTROINTESTINAL ENDOSCOPY    . VIDEO BRONCHOSCOPY N/A 09/12/2019   Procedure: VIDEO BRONCHOSCOPY WITH FLUORO;  Surgeon: Laurin Coder, MD;  [facility];  Service: Endoscopy;  Laterality: N/A;    There were no vitals filed for this visit.   Subjective Assessment - 02/22/20 1210    Subjective relays 5/10 pain but its more stiffness than anything.    Pertinent History PMH: Lt TKA revision 2016, depression, fatigue, obesity,osteopenia, lumbar surgery X2    Limitations Standing;Walking;Lifting    How long can you stand comfortably? 5 min (more limited by back pain than knee pain she states)    How long can you walk comfortably? 5 min    Diagnostic tests "XR Lt knee X-rays demonstrate  a well-seated prosthesis without complication. XR Rt knee Moderate degenerative changes medial and patellofemoral compartments"    Patient Stated Goals get the pain down, improve function    Pain Onset More than a month ago            [facility] Adult PT Treatment/Exercise - 02/22/20 0001      Knee/Hip Exercises: Stretches   Sports administrator Both;2 reps;30 seconds    Quad Stretch Limitations supine with strap leg off EOB    Knee: Self-Stretch Limitations tailgate stretch 10 sec  X10 reps bilat    Gastroc Stretch 3 reps;30 seconds    Gastroc Stretch Limitations slanboard      Knee/Hip Exercises: Aerobic   Nustep Lvl 5 10 mins UE/LE seat 8 with heat to her back      Knee/Hip Exercises: Machines for Strengthening   Total Gym Leg Press 62 lbs bilat push  3X10      Knee/Hip Exercises: Standing   Heel Raises Limitations heel and toe raises X 15 ea    Lateral Step Up Both;10 reps;Hand Hold: 1;Step Height: 4"    Forward Step Up Both;10 reps;Step Height: 4";Hand Hold: 1                    PT Short Term Goals - 02/10/20 1520      PT SHORT TERM GOAL #1   Title Pt will be I and compliant with HEP.    Time 4    Period Weeks    Status On-going    Target Date 03/05/20      PT SHORT TERM GOAL #2   Title Pt will improve Lt knee strength to 4/5 MMT    Time 4    Period Weeks    Status On-going             PT Long Term Goals - 02/06/20 1018      PT LONG TERM GOAL #1   Title Pt will improve overall hip/knee strength to 4+ to improve function.    Time 8    Period Weeks    Status New    Target Date 04/02/20      PT LONG TERM GOAL #2   Title Pt will improve 5TSTS test to less than 14 sec to show improved balance and leg endurance    Baseline 17.5    Time 8    Period Weeks    Status New      PT LONG TERM GOAL #3   Title Pt will improve FOTO score to at least 47% functional score    Time 8    Period Weeks    Status New      PT LONG TERM GOAL #4   Title Pt will be able to stand and walk at least 15 minutes with overall 4 or less pain out of 10.    Baseline 7 pain after 5 min    Status New                 Plan - 02/22/20 1308    Clinical Impression Statement Session focused on  addressing her impairments in strength, ROM, and endurance. She had good tolerance but gets fatiqued and will continue to benefit from gentle exercise progression.    Personal Factors and Comorbidities Comorbidity 3+    Comorbidities PMH: Lt TKA revision 2016, depression, fatigue, obesity,osteopenia, lumbar surgery X2    Examination-Activity Limitations Lift;Stand;Squat;Stairs;Locomotion Level;Bend    Examination-Participation Restrictions Cleaning;Community Activity;Driving;Shop  Stability/Clinical Decision Making Evolving/Moderate  complexity    Rehab Potential Good    PT Frequency 2x / week    PT Duration 8 weeks    PT Treatment/Interventions ADLs/Self Care Home Management;Cryotherapy;Electrical Stimulation;Iontophoresis 4mg /ml Dexamethasone;Moist Heat;Ultrasound;Gait training;Stair training;Therapeutic activities;Therapeutic exercise;Balance training;Neuromuscular re-education;Manual techniques;Passive range of motion;Dry needling;Joint Manipulations;Taping;Vasopneumatic Device    PT Next Visit Plan needs hip/knee strength, ROM, and gentle strength progression. modalaties for pain PRN    PT Home Exercise Plan Access Code: [email or web address]    Consulted and Agree with Plan of Care Patient           Patient will benefit from skilled therapeutic intervention in order to improve the following deficits and impairments:  Decreased activity tolerance, Abnormal gait, Decreased mobility, Decreased strength, Decreased range of motion, Difficulty walking, Impaired flexibility, Increased muscle spasms, Pain, Postural dysfunction, Obesity  Visit Diagnosis: Chronic pain of left knee  Chronic pain of right knee  Stiffness of left knee, not elsewhere classified  Muscle weakness (generalized)  Other abnormalities of gait and mobility  Localized edema     Problem List Patient Active Problem List   Diagnosis Date Noted  . Ankle edema, bilateral 10/31/2019  . Excessive daytime sleepiness 06/28/2019  . Shortness of breath 06/28/2019  . Healthcare maintenance 06/28/2019  . ANA positive 06/28/2019  . Bronchiectasis ([place]) 06/28/2019  . Depression, major, single episode, moderate ([place]) 11/19/2018  . Fatigue 03/23/2018  . Recurrent major depressive disorder, in partial remission ([place]) 05/04/2017  . Left knee pain 01/12/2017  . Ovarian cyst 10/30/2016  . Aortic atherosclerosis ([place]) 11/19/2015  . Hyperlipemia 04/03/2015  . S/P revision of total knee 09/19/2014  . Seizure disorder ([place]) 01/24/2014  . Obesity, BMI unknown  01/24/2014  . Osteopenia, stable on dexa 2010 - followed by gyn 06/15/2012  . Pap smear abnormality of cervix/human papillomavirus (HPV) positive - 09/2011, followed by gyn 06/15/2012  . Essential hypertension 07/03/2009  . Hypothyroidism 05/27/2007  . Allergic rhinitis 05/27/2007  . GERD 05/27/2007    Silvestre Mesi 02/22/2020, 1:10 PM  [facility] Physical Therapy [address] [place], [place], [address] Phone: [phone]   Fax:  [phone]  Name: RIVERS GASSMANN MRN: [id number] Date of Birth: 01/14/1946

## 2020-02-28 ENCOUNTER — Ambulatory Visit (INDEPENDENT_AMBULATORY_CARE_PROVIDER_SITE_OTHER): Payer: Medicare HMO | Admitting: Physical Therapy

## 2020-02-28 ENCOUNTER — Other Ambulatory Visit: Payer: Self-pay

## 2020-02-28 ENCOUNTER — Encounter: Payer: Self-pay | Admitting: Physical Therapy

## 2020-02-28 DIAGNOSIS — R2689 Other abnormalities of gait and mobility: Secondary | ICD-10-CM | POA: Diagnosis not present

## 2020-02-28 DIAGNOSIS — M25561 Pain in right knee: Secondary | ICD-10-CM

## 2020-02-28 DIAGNOSIS — G8929 Other chronic pain: Secondary | ICD-10-CM

## 2020-02-28 DIAGNOSIS — R600 Localized edema: Secondary | ICD-10-CM | POA: Diagnosis not present

## 2020-02-28 DIAGNOSIS — M25662 Stiffness of left knee, not elsewhere classified: Secondary | ICD-10-CM | POA: Diagnosis not present

## 2020-02-28 DIAGNOSIS — M25562 Pain in left knee: Secondary | ICD-10-CM

## 2020-02-28 DIAGNOSIS — M6281 Muscle weakness (generalized): Secondary | ICD-10-CM | POA: Diagnosis not present

## 2020-02-28 NOTE — Therapy (Signed)
[facility] [address] [place], [place], [address] Phone: [phone]   Fax:  [phone]  Physical Therapy Treatment  Patient Details  Name: Beverly Ballard MRN: [id number] Date of Birth: 09-25-1945 Referring Provider (PT): Aundra Dubin, [place]   Encounter Date: 02/28/2020   PT End of Session - 02/28/20 1232    Visit Number 4    Number of Visits 15    Date for PT Re-Evaluation 04/02/20    Progress Note Due on Visit 10    PT Start Time 8938    PT Stop Time 1225    PT Time Calculation (min) 40 min    Activity Tolerance Patient tolerated treatment well    Behavior During Therapy [facility] for tasks assessed/performed           Past Medical History:  Diagnosis Date   Allergy    Anxiety and depression 12/19/2009   Arthritis    Cancer ([place])    vaginal   Constipation    Degenerative disorder of eye    Diverticulosis    Essential hypertension, benign 06/26/2009   GERD 05/27/2007   takes Nexium daily   Heart murmur    yrs ago   History of gout    History of migraine many yrs ago   Hyperlipidemia    takes Pravastatin daily   Hypothyroidism    Insomnia    but doesn't take any meds   LOW BACK PAIN 05/27/2007   Numbness    left toes    Osteoporosis    Seizures ([place])    last 1983, none since then- last seizure 1988 per pt     Past Surgical History:  Procedure Laterality Date   ADENOIDECTOMY     BACK SURGERY     lumbar x2   BRONCHIAL BIOPSY  09/12/2019   Procedure: BRONCHIAL BIOPSIES;  Surgeon: Laurin Coder, MD;  Location: [facility];  Service: Endoscopy;;   BRONCHIAL WASHINGS  09/12/2019   Procedure: BRONCHIAL WASHINGS;  Surgeon: Laurin Coder, MD;  [facility]: [facility];  Service: Endoscopy;;   COLONOSCOPY  2009   ESOPHAGOGASTRODUODENOSCOPY     Heel spurs Bilateral    HEMOSTASIS CONTROL  09/12/2019   Procedure: HEMOSTASIS CONTROL;  Surgeon: Laurin Coder, MD;  [facility];   Service: Endoscopy;;  epi   KNEE ARTHROPLASTY Left 11/07/2013   Procedure: COMPUTER ASSISTED TOTAL KNEE ARTHROPLASTY;  Surgeon: Marybelle Killings, MD;  Location: [facility];  Service: Orthopedics;  Laterality: Left;  Left Total Knee Arthroplasty, Cemented, Computer Assist   TONSILLECTOMY     TOTAL KNEE REVISION Left 09/19/2014   Procedure: LEFT TOTAL KNEE REVISION;  Surgeon: Leandrew Koyanagi, MD;  Location: [facility];  Service: Orthopedics;  Laterality: Left;   UPPER GASTROINTESTINAL ENDOSCOPY     VIDEO BRONCHOSCOPY N/A 09/12/2019   Procedure: VIDEO BRONCHOSCOPY WITH FLUORO;  Surgeon: Laurin Coder, MD;  [facility];  Service: Endoscopy;  Laterality: N/A;    There were no vitals filed for this visit.   Subjective Assessment - 02/28/20 1216    Subjective relays 5/10 pain and stiffness in her knees today, says most of the pain is in her shoulder but she knows PT is not treating her for that    Pertinent History PMH: Lt TKA revision 2016, depression, fatigue, obesity,osteopenia, lumbar surgery X2    Limitations Standing;Walking;Lifting    How long can you stand comfortably? 5 min (more limited by back pain than knee pain she states)  How long can you walk comfortably? 5 min    Diagnostic tests "XR Lt knee X-rays demonstrate a well-seated prosthesis without complication. XR Rt knee Moderate degenerative changes medial and patellofemoral compartments"    Patient Stated Goals get the pain down, improve function    Pain Onset More than a month ago              [facility] PT Assessment - 02/28/20 0001      Assessment   Medical Diagnosis Chronic pain of both knees    Referring Provider (PT) Aundra Dubin, PA-C      AROM   Right Knee Flexion 130    Left Knee Flexion 123      Strength   Right Hip Flexion 4/5    Right Hip ABduction 4/5    Left Hip Flexion 4/5    Left Hip ABduction 4/5    Right Knee Flexion 5/5    Right Knee Extension 4+/5    Left Knee Flexion 4+/5    Left Knee  Extension 4+/5      Timed Up and Go Test   Normal TUG (seconds) 10.8                         [facility] Adult PT Treatment/Exercise - 02/28/20 0001      Knee/Hip Exercises: Stretches   Sports administrator Both;3 reps;30 seconds    Knee: Self-Stretch Limitations standing lunge stretch with foot on 8 inch step 10 sec X 10 bilat, tailgate stretch 10 sec  X10 reps bilat    Gastroc Stretch 3 reps;30 seconds    Gastroc Stretch Limitations slanboard      Knee/Hip Exercises: Aerobic   Nustep Lvl 5 10 mins UE/LE seat 8 with heat to her back      Knee/Hip Exercises: Machines for Strengthening   Cybex Knee Extension 5 lbs 2X10 bilat    Cybex Knee Flexion 20 lbs 2X10 bilat    Total Gym Leg Press 68 lbs bilat push 3X10      Knee/Hip Exercises: Standing   Forward Step Up Both;10 reps;Hand Hold: 1;Step Height: 6"                    PT Short Term Goals - 02/28/20 1235      PT SHORT TERM GOAL #1   Title Pt will be I and compliant with HEP.    Time 4    Period Weeks    Status Achieved    Target Date 03/05/20      PT SHORT TERM GOAL #2   Title Pt will improve Lt knee strength to 4/5 MMT    Time 4    Period Weeks    Status Achieved             PT Long Term Goals - 02/28/20 1235      PT LONG TERM GOAL #1   Title Pt will improve overall hip/knee strength to 4+ to improve function.    Baseline 4 overall now    Time 8    Period Weeks    Status On-going      PT LONG TERM GOAL #2   Title Pt will improve 5TSTS test to less than 14 sec to show improved balance and leg endurance    Baseline 17.5    Time 8    Period Weeks    Status New      PT LONG TERM GOAL #3  Title Pt will improve FOTO score to at least 47% functional score    Time 8    Period Weeks    Status New      PT LONG TERM GOAL #4   Title Pt will be able to stand and walk at least 15 minutes with overall 4 or less pain out of 10.    Baseline 7 pain after 5 min    Status On-going                  Plan - 02/28/20 1234    Clinical Impression Statement She showed good initial progress with updated measurments in strength, knee ROM, and TUG test. However still with deficits in strength, ROM, endurance, and dynamic balance and will continue to benefit from PT    Personal Factors and Comorbidities Comorbidity 3+    Comorbidities PMH: Lt TKA revision 2016, depression, fatigue, obesity,osteopenia, lumbar surgery X2    Examination-Activity Limitations Lift;Stand;Squat;Stairs;Locomotion Level;Bend    Examination-Participation Restrictions Cleaning;Community Activity;Driving;Shop    Stability/Clinical Decision Making Evolving/Moderate complexity    Rehab Potential Good    PT Frequency 2x / week    PT Duration 8 weeks    PT Treatment/Interventions ADLs/Self Care Home Management;Cryotherapy;Electrical Stimulation;Iontophoresis 4mg /ml Dexamethasone;Moist Heat;Ultrasound;Gait training;Stair training;Therapeutic activities;Therapeutic exercise;Balance training;Neuromuscular re-education;Manual techniques;Passive range of motion;Dry needling;Joint Manipulations;Taping;Vasopneumatic Device    PT Next Visit Plan needs hip/knee strength, ROM, and gentle strength progression. modalaties for pain PRN    PT Home Exercise Plan Access Code: [email or web address]    Consulted and Agree with Plan of Care Patient           Patient will benefit from skilled therapeutic intervention in order to improve the following deficits and impairments:  Decreased activity tolerance, Abnormal gait, Decreased mobility, Decreased strength, Decreased range of motion, Difficulty walking, Impaired flexibility, Increased muscle spasms, Pain, Postural dysfunction, Obesity  Visit Diagnosis: Chronic pain of left knee  Chronic pain of right knee  Stiffness of left knee, not elsewhere classified  Muscle weakness (generalized)  Other abnormalities of gait and mobility  Localized edema     Problem List Patient Active  Problem List   Diagnosis Date Noted   Ankle edema, bilateral 10/31/2019   Excessive daytime sleepiness 06/28/2019   Shortness of breath 06/28/2019   Healthcare maintenance 06/28/2019   ANA positive 06/28/2019   Bronchiectasis ([place]) 06/28/2019   Depression, major, single episode, moderate ([place]) 11/19/2018   Fatigue 03/23/2018   Recurrent major depressive disorder, in partial remission ([place]) 05/04/2017   Left knee pain 01/12/2017   Ovarian cyst 10/30/2016   Aortic atherosclerosis ([place]) 11/19/2015   Hyperlipemia 04/03/2015   S/P revision of total knee 09/19/2014   Seizure disorder ([place]) 01/24/2014   Obesity, BMI unknown 01/24/2014   Osteopenia, stable on dexa 2010 - followed by gyn 06/15/2012   Pap smear abnormality of cervix/human papillomavirus (HPV) positive - 09/2011, followed by gyn 06/15/2012   Essential hypertension 07/03/2009   Hypothyroidism 05/27/2007   Allergic rhinitis 05/27/2007   GERD 05/27/2007    Silvestre Mesi 02/28/2020, 12:36 PM  [facility] Physical Therapy [address] [place], [place], [address] Phone: [phone]   Fax:  [phone]  Name: Beverly Ballard MRN: [id number] Date of Birth: 15-Jun-1945

## 2020-02-29 DIAGNOSIS — J301 Allergic rhinitis due to pollen: Secondary | ICD-10-CM | POA: Diagnosis not present

## 2020-02-29 DIAGNOSIS — J3081 Allergic rhinitis due to animal (cat) (dog) hair and dander: Secondary | ICD-10-CM | POA: Diagnosis not present

## 2020-02-29 DIAGNOSIS — J3089 Other allergic rhinitis: Secondary | ICD-10-CM | POA: Diagnosis not present

## 2020-03-01 DIAGNOSIS — G4733 Obstructive sleep apnea (adult) (pediatric): Secondary | ICD-10-CM | POA: Diagnosis not present

## 2020-03-02 ENCOUNTER — Ambulatory Visit: Payer: Medicare HMO | Admitting: Physical Therapy

## 2020-03-02 ENCOUNTER — Other Ambulatory Visit: Payer: Self-pay

## 2020-03-02 ENCOUNTER — Encounter: Payer: Self-pay | Admitting: Physical Therapy

## 2020-03-02 DIAGNOSIS — M6281 Muscle weakness (generalized): Secondary | ICD-10-CM

## 2020-03-02 DIAGNOSIS — J3089 Other allergic rhinitis: Secondary | ICD-10-CM | POA: Diagnosis not present

## 2020-03-02 DIAGNOSIS — J3081 Allergic rhinitis due to animal (cat) (dog) hair and dander: Secondary | ICD-10-CM | POA: Diagnosis not present

## 2020-03-02 DIAGNOSIS — M25561 Pain in right knee: Secondary | ICD-10-CM

## 2020-03-02 DIAGNOSIS — G8929 Other chronic pain: Secondary | ICD-10-CM

## 2020-03-02 DIAGNOSIS — J301 Allergic rhinitis due to pollen: Secondary | ICD-10-CM | POA: Diagnosis not present

## 2020-03-02 DIAGNOSIS — R2689 Other abnormalities of gait and mobility: Secondary | ICD-10-CM

## 2020-03-02 DIAGNOSIS — M25662 Stiffness of left knee, not elsewhere classified: Secondary | ICD-10-CM

## 2020-03-02 DIAGNOSIS — M25562 Pain in left knee: Secondary | ICD-10-CM | POA: Diagnosis not present

## 2020-03-02 DIAGNOSIS — R600 Localized edema: Secondary | ICD-10-CM

## 2020-03-02 NOTE — Therapy (Addendum)
[facility] Physical Therapy [address] [place], [place], [address] Phone: [phone]   Fax:  [phone]  Physical Therapy Treatment/Discharge  Patient Details  Name: Beverly Ballard MRN: [id number] Date of Birth: [birth year] Referring Provider (PT): Aundra Dubin, [place]   Encounter Date: 03/02/2020   PT End of Session - 03/02/20 1139    Visit Number 5    Number of Visits 15    Date for PT Re-Evaluation 04/02/20    Progress Note Due on Visit 10    PT Start Time 1101    PT Stop Time 1139    PT Time Calculation (min) 38 min    Activity Tolerance Patient tolerated treatment well    Behavior During Therapy [facility] for tasks assessed/performed           Past Medical History:  Diagnosis Date  . Allergy   . Anxiety and depression 12/19/2009  . Arthritis   . Cancer ([facility])    vaginal  . Constipation   . Degenerative disorder of eye   . Diverticulosis   . Essential hypertension, benign 06/26/2009  . GERD 05/27/2007   takes Nexium daily  . Heart murmur    yrs ago  . History of gout   . History of migraine many yrs ago  . Hyperlipidemia    takes Pravastatin daily  . Hypothyroidism   . Insomnia    but doesn't take any meds  . LOW BACK PAIN 05/27/2007  . Numbness    left toes   . Osteoporosis   . Seizures ([place])    last 1983, none since then- last seizure 1988 per pt     Past Surgical History:  Procedure Laterality Date  . ADENOIDECTOMY    . BACK SURGERY     lumbar x2  . BRONCHIAL BIOPSY  09/12/2019   Procedure: BRONCHIAL BIOPSIES;  Surgeon: Laurin Coder, MD;  [facility];  Service: Endoscopy;;  . BRONCHIAL WASHINGS  09/12/2019   Procedure: BRONCHIAL WASHINGS;  Surgeon: Laurin Coder, MD;  [facility];  Service: Endoscopy;;  . COLONOSCOPY  2009  . ESOPHAGOGASTRODUODENOSCOPY    . Heel spurs Bilateral   . HEMOSTASIS CONTROL  09/12/2019   Procedure: HEMOSTASIS CONTROL;  Surgeon: Laurin Coder, MD;  [facility];  Service: Endoscopy;;  epi  . KNEE ARTHROPLASTY Left 11/07/2013   Procedure: COMPUTER ASSISTED TOTAL KNEE ARTHROPLASTY;  Surgeon: Marybelle Killings, MD;  Location: [facility];  Service: Orthopedics;  Laterality: Left;  Left Total Knee Arthroplasty, Cemented, Computer Assist  . TONSILLECTOMY    . TOTAL KNEE REVISION Left 09/19/2014   Procedure: LEFT TOTAL KNEE REVISION;  Surgeon: Leandrew Koyanagi, MD;  Location: [facility];  Service: Orthopedics;  Laterality: Left;  . UPPER GASTROINTESTINAL ENDOSCOPY    . VIDEO BRONCHOSCOPY N/A 09/12/2019   Procedure: VIDEO BRONCHOSCOPY WITH FLUORO;  Surgeon: Laurin Coder, MD;  [facility];  Service: Endoscopy;  Laterality: N/A;    There were no vitals filed for this visit.   Subjective Assessment - 03/02/20 1138    Subjective relays she was very sore afer last visit, today its better and overall the knee stiffness is improving. She also relays she is having a more difficult time breathing today.    Pertinent History PMH: Lt TKA revision 2016, depression, fatigue, obesity,osteopenia, lumbar surgery X2    Limitations Standing;Walking;Lifting    How long can you stand comfortably? 5 min (more limited by back pain than knee pain she states)  How long can you walk comfortably? 5 min    Diagnostic tests "XR Lt knee X-rays demonstrate a well-seated prosthesis without complication. XR Rt knee Moderate degenerative changes medial and patellofemoral compartments"    Patient Stated Goals get the pain down, improve function    Pain Onset More than a month ago            [facility] Adult PT Treatment/Exercise - 03/02/20 0001      Knee/Hip Exercises: Stretches   Active Hamstring Stretch Both;2 reps;30 seconds    Quad Stretch Both;3 reps;30 seconds    Knee: Self-Stretch Limitations standing lunge stretch with foot on 6 inch step 10 sec X 5 bilat, tailgate stretch 10 sec  X10 reps bilat    Gastroc Stretch 3 reps;30 seconds    Gastroc Stretch Limitations slanboard       Knee/Hip Exercises: Aerobic   Other Aerobic UBE UE/LE L2 X 5 min      Knee/Hip Exercises: Machines for Strengthening   Total Gym Leg Press --      Knee/Hip Exercises: Standing   Forward Step Up Both;10 reps;Hand Hold: 1;Step Height: 6"      Knee/Hip Exercises: Seated   Long Arc Quad Both;2 sets;10 reps    Long Arc Quad Weight 3 lbs.    Marching Both;2 sets;10 reps    Marching Weights 3 lbs.    Hamstring Curl Both;2 sets;10 reps    Hamstring Limitations red                    PT Short Term Goals - 02/28/20 1235      PT SHORT TERM GOAL #1   Title Pt will be I and compliant with HEP.    Time 4    Period Weeks    Status Achieved    Target Date 03/05/20      PT SHORT TERM GOAL #2   Title Pt will improve Lt knee strength to 4/5 MMT    Time 4    Period Weeks    Status Achieved             PT Long Term Goals - 02/28/20 1235      PT LONG TERM GOAL #1   Title Pt will improve overall hip/knee strength to 4+ to improve function.    Baseline 4 overall now    Time 8    Period Weeks    Status On-going      PT LONG TERM GOAL #2   Title Pt will improve 5TSTS test to less than 14 sec to show improved balance and leg endurance    Baseline 17.5    Time 8    Period Weeks    Status New      PT LONG TERM GOAL #3   Title Pt will improve FOTO score to at least 47% functional score    Time 8    Period Weeks    Status New      PT LONG TERM GOAL #4   Title Pt will be able to stand and walk at least 15 minutes with overall 4 or less pain out of 10.    Baseline 7 pain after 5 min    Status On-going                 Plan - 03/02/20 1140    Clinical Impression Statement She had more SHOB today limiting sesison. She was also very sore after last visit to backed down on  some of the strengthening today. Flexability and ROM appears to be improving with PT, continue POC    Personal Factors and Comorbidities Comorbidity 3+    Comorbidities PMH: Lt TKA revision  2016, depression, fatigue, obesity,osteopenia, lumbar surgery X2    Examination-Activity Limitations Lift;Stand;Squat;Stairs;Locomotion Level;Bend    Examination-Participation Restrictions Cleaning;Community Activity;Driving;Shop    Stability/Clinical Decision Making Evolving/Moderate complexity    Rehab Potential Good    PT Frequency 2x / week    PT Duration 8 weeks    PT Treatment/Interventions ADLs/Self Care Home Management;Cryotherapy;Electrical Stimulation;Iontophoresis 51m/ml Dexamethasone;Moist Heat;Ultrasound;Gait training;Stair training;Therapeutic activities;Therapeutic exercise;Balance training;Neuromuscular re-education;Manual techniques;Passive range of motion;Dry needling;Joint Manipulations;Taping;Vasopneumatic Device    PT Next Visit Plan needs hip/knee strength, ROM, and gentle strength progression. modalaties for pain PRN    PT Home Exercise Plan Access Code: [email or web address]   Consulted and Agree with Plan of Care Patient           Patient will benefit from skilled therapeutic intervention in order to improve the following deficits and impairments:  Decreased activity tolerance, Abnormal gait, Decreased mobility, Decreased strength, Decreased range of motion, Difficulty walking, Impaired flexibility, Increased muscle spasms, Pain, Postural dysfunction, Obesity  Visit Diagnosis: Chronic pain of left knee  Chronic pain of right knee  Stiffness of left knee, not elsewhere classified  Muscle weakness (generalized)  Other abnormalities of gait and mobility  Localized edema     Problem List Patient Active Problem List   Diagnosis Date Noted  . Ankle edema, bilateral 10/31/2019  . Excessive daytime sleepiness 06/28/2019  . Shortness of breath 06/28/2019  . Healthcare maintenance 06/28/2019  . ANA positive 06/28/2019  . Bronchiectasis ([place] 06/28/2019  . Depression, major, single episode, moderate ([place] 11/19/2018  . Fatigue 03/23/2018  . Recurrent major depressive  disorder, in partial remission ([place] 05/04/2017  . Left knee pain 01/12/2017  . Ovarian cyst 10/30/2016  . Aortic atherosclerosis ([place] 11/19/2015  . Hyperlipemia 04/03/2015  . S/P revision of total knee 09/19/2014  . Seizure disorder ([place] 01/24/2014  . Obesity, BMI unknown 01/24/2014  . Osteopenia, stable on dexa 2010 - followed by gyn 06/15/2012  . Pap smear abnormality of cervix/human papillomavirus (HPV) positive - 09/2011, followed by gyn 06/15/2012  . Essential hypertension 07/03/2009  . Hypothyroidism 05/27/2007  . Allergic rhinitis 05/27/2007  . GERD 05/27/2007    BSilvestre Mesi11/03/2020, 11:41 AM   PHYSICAL THERAPY DISCHARGE SUMMARY  Visits from Start of Care: 5  Current functional level related to goals / functional outcomes: See note   Remaining deficits: See note   Education / Equipment: HEP Plan: Patient agrees to discharge.  Patient goals were partially met. Patient is being discharged due to not returning since the last visit.  ?????    MScot Jun PT, DPT, OCS, ATC 04/04/20  12:24 PM     [facility] [address] [address]Phone: [phone]  Fax:  [phone] Name: SZAYLA AGARMRN: [id number] of Birth: [birth year]

## 2020-03-03 ENCOUNTER — Emergency Department (HOSPITAL_COMMUNITY): Payer: Medicare HMO

## 2020-03-03 ENCOUNTER — Other Ambulatory Visit: Payer: Self-pay

## 2020-03-03 ENCOUNTER — Emergency Department (HOSPITAL_COMMUNITY)
Admission: EM | Admit: 2020-03-03 | Discharge: 2020-03-03 | Disposition: A | Discharge status: Home / Self Care | Payer: Medicare HMO | Attending: Emergency Medicine | Admitting: Emergency Medicine

## 2020-03-03 ENCOUNTER — Encounter (HOSPITAL_COMMUNITY): Payer: Self-pay

## 2020-03-03 DIAGNOSIS — M25551 Pain in right hip: Secondary | ICD-10-CM | POA: Diagnosis not present

## 2020-03-03 DIAGNOSIS — S01111A Laceration without foreign body of right eyelid and periocular area, initial encounter: Secondary | ICD-10-CM | POA: Diagnosis not present

## 2020-03-03 DIAGNOSIS — Z79899 Other long term (current) drug therapy: Secondary | ICD-10-CM | POA: Diagnosis not present

## 2020-03-03 DIAGNOSIS — Z87891 Personal history of nicotine dependence: Secondary | ICD-10-CM | POA: Diagnosis not present

## 2020-03-03 DIAGNOSIS — S199XXA Unspecified injury of neck, initial encounter: Secondary | ICD-10-CM | POA: Diagnosis not present

## 2020-03-03 DIAGNOSIS — I10 Essential (primary) hypertension: Secondary | ICD-10-CM | POA: Insufficient documentation

## 2020-03-03 DIAGNOSIS — E039 Hypothyroidism, unspecified: Secondary | ICD-10-CM | POA: Diagnosis not present

## 2020-03-03 DIAGNOSIS — Z96652 Presence of left artificial knee joint: Secondary | ICD-10-CM | POA: Diagnosis not present

## 2020-03-03 DIAGNOSIS — Z8544 Personal history of malignant neoplasm of other female genital organs: Secondary | ICD-10-CM | POA: Insufficient documentation

## 2020-03-03 DIAGNOSIS — Z91040 Latex allergy status: Secondary | ICD-10-CM | POA: Insufficient documentation

## 2020-03-03 DIAGNOSIS — W108XXA Fall (on) (from) other stairs and steps, initial encounter: Secondary | ICD-10-CM | POA: Insufficient documentation

## 2020-03-03 DIAGNOSIS — Z7982 Long term (current) use of aspirin: Secondary | ICD-10-CM | POA: Diagnosis not present

## 2020-03-03 DIAGNOSIS — W19XXXA Unspecified fall, initial encounter: Secondary | ICD-10-CM | POA: Diagnosis not present

## 2020-03-03 DIAGNOSIS — Z981 Arthrodesis status: Secondary | ICD-10-CM | POA: Diagnosis not present

## 2020-03-03 DIAGNOSIS — S0990XA Unspecified injury of head, initial encounter: Secondary | ICD-10-CM | POA: Diagnosis not present

## 2020-03-03 DIAGNOSIS — Y9301 Activity, walking, marching and hiking: Secondary | ICD-10-CM | POA: Diagnosis not present

## 2020-03-03 DIAGNOSIS — R519 Headache, unspecified: Secondary | ICD-10-CM | POA: Diagnosis not present

## 2020-03-03 DIAGNOSIS — M545 Low back pain, unspecified: Secondary | ICD-10-CM | POA: Diagnosis not present

## 2020-03-03 NOTE — ED Triage Notes (Signed)
Patient was walking up steps fall on right hip laceration to right eyebrow, and abrasion to right knee. Patient had no LOC. Not on blood thinners.

## 2020-03-03 NOTE — Discharge Instructions (Addendum)
Follow-up with your primary care doctor to further discuss findings on right hip x-ray.  Suspect that this is a benign finding but might be worth to have a further discussion with your primary care doctor about.

## 2020-03-03 NOTE — ED Notes (Signed)
Patient transported to CT 

## 2020-03-03 NOTE — ED Provider Notes (Signed)
[facility] EMERGENCY DEPARTMENT Provider Note   CSN: [id number] Arrival date & time: 03/03/20  1507     History Chief Complaint  Patient presents with  . Fall    ANALYNN Ballard is a 74 y.o. female.  Patient tripped while walking. Landed on her right side. Right hip pain. Laceration to right eyebrow. Did not lose consciousness that she can think of. Has some mild headache. No neck pain. No back pain.  The history is provided by the patient.  Fall This is a new problem. The current episode started less than 1 hour ago. The problem has not changed since onset.Pertinent negatives include no chest pain, no abdominal pain and no shortness of breath. Nothing aggravates the symptoms. She has tried nothing for the symptoms. The treatment provided no relief.       Past Medical History:  Diagnosis Date  . Allergy   . Anxiety and depression 12/19/2009  . Arthritis   . Cancer (HCC)    vaginal  . Constipation   . Degenerative disorder of eye   . Diverticulosis   . Essential hypertension, benign 06/26/2009  . GERD 05/27/2007   takes Nexium daily  . Heart murmur    yrs ago  . History of gout   . History of migraine many yrs ago  . Hyperlipidemia    takes Pravastatin daily  . Hypothyroidism   . Insomnia    but doesn't take any meds  . LOW BACK PAIN 05/27/2007  . Numbness    left toes   . Osteoporosis   . Seizures ([place])    last 1983, none since then- last seizure 1988 per pt     Patient Active Problem List   Diagnosis Date Noted  . Ankle edema, bilateral 10/31/2019  . Excessive daytime sleepiness 06/28/2019  . Shortness of breath 06/28/2019  . Healthcare maintenance 06/28/2019  . ANA positive 06/28/2019  . Bronchiectasis ([place]) 06/28/2019  . Depression, major, single episode, moderate ([place]) 11/19/2018  . Fatigue 03/23/2018  . Recurrent major depressive disorder, in partial remission ([place]) 05/04/2017  . Left knee pain 01/12/2017  . Ovarian cyst 10/30/2016    . Aortic atherosclerosis ([place]) 11/19/2015  . Hyperlipemia 04/03/2015  . S/P revision of total knee 09/19/2014  . Seizure disorder ([place]) 01/24/2014  . Obesity, BMI unknown 01/24/2014  . Osteopenia, stable on dexa 2010 - followed by gyn 06/15/2012  . Pap smear abnormality of cervix/human papillomavirus (HPV) positive - 09/2011, followed by gyn 06/15/2012  . Essential hypertension 07/03/2009  . Hypothyroidism 05/27/2007  . Allergic rhinitis 05/27/2007  . GERD 05/27/2007    Past Surgical History:  Procedure Laterality Date  . ADENOIDECTOMY    . BACK SURGERY     lumbar x2  . BRONCHIAL BIOPSY  09/12/2019   Procedure: BRONCHIAL BIOPSIES;  Surgeon: Laurin Coder, MD;  Location: [facility];  Service: Endoscopy;;  . BRONCHIAL WASHINGS  09/12/2019   Procedure: BRONCHIAL WASHINGS;  Surgeon: Laurin Coder, MD;  Location: [facility];  Service: Endoscopy;;  . COLONOSCOPY  2009  . ESOPHAGOGASTRODUODENOSCOPY    . Heel spurs Bilateral   . HEMOSTASIS CONTROL  09/12/2019   Procedure: HEMOSTASIS CONTROL;  Surgeon: Laurin Coder, MD;  Location: [facility];  Service: Endoscopy;;  epi  . KNEE ARTHROPLASTY Left 11/07/2013   Procedure: COMPUTER ASSISTED TOTAL KNEE ARTHROPLASTY;  Surgeon: Marybelle Killings, MD;  Location: [facility];  Service: Orthopedics;  Laterality: Left;  Left Total Knee Arthroplasty, Cemented, Computer Assist  .  TONSILLECTOMY    . TOTAL KNEE REVISION Left 09/19/2014   Procedure: LEFT TOTAL KNEE REVISION;  Surgeon: Leandrew Koyanagi, MD;  Location: [facility];  Service: Orthopedics;  Laterality: Left;  . UPPER GASTROINTESTINAL ENDOSCOPY    . VIDEO BRONCHOSCOPY N/A 09/12/2019   Procedure: VIDEO BRONCHOSCOPY WITH FLUORO;  Surgeon: Laurin Coder, MD;  Location: [facility];  Service: Endoscopy;  Laterality: N/A;     OB History   No obstetric history on file.     Family History  Problem Relation Age of Onset  . Aortic dissection Father   . Arthritis Mother   . Stroke Mother 33   . Heart murmur Other   . Throat cancer Sister   . Colon cancer Neg Hx   . Esophageal cancer Neg Hx   . Stomach cancer Neg Hx   . Rectal cancer Neg Hx   . Colon polyps Neg Hx     Social History   Tobacco Use  . Smoking status: Former Smoker    Packs/day: 0.25    Years: 20.00    Pack years: 5.00    Types: Cigarettes    Quit date: 09/08/1978    Years since quitting: 41.5  . Smokeless tobacco: Never Used  . Tobacco comment: quit smoking 35yrs ago  Vaping Use  . Vaping Use: Never used  Substance Use Topics  . Alcohol use: No  . Drug use: No    Home Medications Prior to Admission medications   Medication Sig Start Date End Date Taking? Authorizing Provider  albuterol (VENTOLIN HFA) 108 (90 Base) MCG/ACT inhaler Inhale 2 puffs into the lungs every 4 (four) hours as needed for wheezing or shortness of breath. 04/01/19   Caren Macadam, MD  aspirin 81 MG tablet Take 81 mg by mouth daily.    [provider]  b complex vitamins tablet Take 1 tablet by mouth daily.    [provider]  beta carotene w/minerals (OCUVITE) tablet Take 1 tablet by mouth daily.    [provider]  Cholecalciferol (VITAMIN D3 PO) Take by mouth.    [provider]  cyclobenzaprine (FLEXERIL) 10 MG tablet Take 1 tablet (10 mg total) by mouth 2 (two) times daily as needed for muscle spasms. 06/03/19   Aundra Dubin, PA-C  EPINEPHrine 0.3 mg/0.3 mL IJ SOAJ injection SMARTSIG:Injection As Directed 09/14/19   [provider]  esomeprazole (NEXIUM) 20 MG capsule Take 2 capsules (40 mg total) by mouth daily before breakfast. Patient taking differently: Take 20 mg by mouth daily before breakfast.  03/21/19   Laurey Morale, MD  furosemide (LASIX) 20 MG tablet TAKE 1 TABLET BY MOUTH IN THE MORNING 01/23/20   Mannam, Praveen, MD  levocetirizine (XYZAL) 5 MG tablet Take 5 mg by mouth daily.  03/05/16   [provider]  levothyroxine (SYNTHROID) 88 MCG tablet Take  1 tablet (88 mcg total) by mouth daily. 07/04/19   Philemon Kingdom, MD  lisinopril (ZESTRIL) 5 MG tablet Take 1 tablet (5 mg total) by mouth daily. 10/31/19   Koberlein, Steele Berg, MD  LORazepam (ATIVAN) 0.5 MG tablet Take 1 tablet (0.5 mg total) by mouth 4 (four) times daily as needed for anxiety. 12/05/19   Addison Lank, PA-C  methocarbamol (ROBAXIN) 500 MG tablet Take 1 tablet (500 mg total) by mouth 2 (two) times daily as needed. 01/27/20   Aundra Dubin, PA-C  ondansetron (ZOFRAN ODT) 4 MG disintegrating tablet Take 1 tablet (4 mg total)  by mouth every 6 (six) hours as needed for nausea or vomiting. 03/30/19   Caren Macadam, MD  pravastatin (PRAVACHOL) 20 MG tablet Take 1 tablet (20 mg total) by mouth daily. TAKE 1 TABLET BY MOUTH EVERY DAY 10/31/19   Caren Macadam, MD  sertraline (ZOLOFT) 100 MG tablet Take 2 tablets (200 mg total) by mouth daily. 01/02/20   Addison Lank, PA-C  Spacer/Aero-Holding Chambers DEVI Use as directed with inhaler 04/01/19   Caren Macadam, MD  zonisamide (ZONEGRAN) 100 MG capsule Take 2 capsules twice a day 02/14/20   Cameron Sprang, MD    Allergies    Lamotrigine, Bupropion, Carbamazepine, Levetiracetam, Tramadol, Adhesive [tape], Clarithromycin, Doxycycline, Nickel, Other, Topamax [topiramate], Estradiol, Latex, and Phenytoin sodium extended  Review of Systems   Review of Systems  Constitutional: Negative for chills and fever.  HENT: Negative for ear pain and sore throat.   Eyes: Negative for pain and visual disturbance.  Respiratory: Negative for cough and shortness of breath.   Cardiovascular: Negative for chest pain and palpitations.  Gastrointestinal: Negative for abdominal pain and vomiting.  Genitourinary: Negative for dysuria and hematuria.  Musculoskeletal: Positive for arthralgias. Negative for back pain.  Skin: Positive for wound. Negative for color change and rash.  Neurological: Negative for seizures and syncope.  All other  systems reviewed and are negative.   Physical Exam Updated Vital Signs BP 137/69 (BP Location: Right Arm)   Pulse 85   Temp 98.6 F (37 C) (Oral)   Resp 18   Ht 5\' 5"  (1.651 m)   Wt 113.4 kg   SpO2 99%   BMI 41.60 kg/m   Physical Exam Vitals and nursing note reviewed.  Constitutional:      General: She is not in acute distress.    Appearance: She is well-developed. She is not ill-appearing.  HENT:     Head: Normocephalic.     Comments: Right eybrow laceration, hemostatic  Eyes:     Extraocular Movements: Extraocular movements intact.     Conjunctiva/sclera: Conjunctivae normal.     Pupils: Pupils are equal, round, and reactive to light.  Cardiovascular:     Rate and Rhythm: Normal rate and regular rhythm.     Heart sounds: No murmur heard.   Pulmonary:     Effort: Pulmonary effort is normal. No respiratory distress.     Breath sounds: Normal breath sounds.  Abdominal:     Palpations: Abdomen is soft.     Tenderness: There is no abdominal tenderness.  Musculoskeletal:        General: Tenderness (right hip) present.     Cervical back: Normal range of motion and neck supple.     Comments: No midline spinal pain  Skin:    General: Skin is warm and dry.     Capillary Refill: Capillary refill takes less than 2 seconds.  Neurological:     General: No focal deficit present.     Mental Status: She is alert and oriented to person, place, and time.     Cranial Nerves: No cranial nerve deficit.     Sensory: No sensory deficit.     Motor: No weakness.     Coordination: Coordination normal.     Comments: 5+ out of 5 strength throughout, normal sensation, no drift, normal finger-to-nose finger  Psychiatric:        Mood and Affect: Mood normal.     ED Results / Procedures / Treatments   Labs (all labs ordered  are listed, but only abnormal results are displayed) Labs Reviewed - No data to display  EKG None  Radiology CT Head Wo Contrast  Result Date:  03/03/2020 CLINICAL DATA:  74 year old female with trauma. EXAM: CT HEAD WITHOUT CONTRAST CT CERVICAL SPINE WITHOUT CONTRAST TECHNIQUE: Multidetector CT imaging of the head and cervical spine was performed following the standard protocol without intravenous contrast. Multiplanar CT image reconstructions of the cervical spine were also generated. COMPARISON:  None. FINDINGS: CT HEAD FINDINGS Brain: The ventricles and sulci appropriate size for patient's age. The gray-white matter discrimination is preserved. There is no acute intracranial hemorrhage. No mass effect midline shift no extra-axial fluid collection. Vascular: No hyperdense vessel or unexpected calcification. Skull: Normal. Negative for fracture or focal lesion. Sinuses/Orbits: No acute finding. Other: None CT CERVICAL SPINE FINDINGS Alignment: No acute subluxation. Skull base and vertebrae: No acute fracture. Soft tissues and spinal canal: No prevertebral fluid or swelling. No visible canal hematoma. Disc levels:  Degenerative changes with spurring. Upper chest: Bilateral interstitial streaky densities. Other: There is nodular soft tissue thickening of the anterior supraglottic trachea (axial 41/5 and sagittal 38/9) at the base of the tongue measuring approximately 3.1 x 1.3 cm in axial dimensions and may represent enlarged adenoidal tissue. A mass is not excluded. Clinical correlation and further evaluation with direct visualization recommended. IMPRESSION: 1. No acute intracranial pathology. 2. No acute/traumatic cervical spine pathology. 3. Nodular soft tissue thickening of the anterior supraglottic trachea at the base of the tongue may represent enlarged adenoidal tissue. A mass is not excluded. Clinical correlation and further evaluation with direct visualization recommended. Electronically Signed   By: Anner Crete M.D.   On: 03/03/2020 16:32   CT Cervical Spine Wo Contrast  Result Date: 03/03/2020 CLINICAL DATA:  74 year old female with  trauma. EXAM: CT HEAD WITHOUT CONTRAST CT CERVICAL SPINE WITHOUT CONTRAST TECHNIQUE: Multidetector CT imaging of the head and cervical spine was performed following the standard protocol without intravenous contrast. Multiplanar CT image reconstructions of the cervical spine were also generated. COMPARISON:  None. FINDINGS: CT HEAD FINDINGS Brain: The ventricles and sulci appropriate size for patient's age. The gray-white matter discrimination is preserved. There is no acute intracranial hemorrhage. No mass effect midline shift no extra-axial fluid collection. Vascular: No hyperdense vessel or unexpected calcification. Skull: Normal. Negative for fracture or focal lesion. Sinuses/Orbits: No acute finding. Other: None CT CERVICAL SPINE FINDINGS Alignment: No acute subluxation. Skull base and vertebrae: No acute fracture. Soft tissues and spinal canal: No prevertebral fluid or swelling. No visible canal hematoma. Disc levels:  Degenerative changes with spurring. Upper chest: Bilateral interstitial streaky densities. Other: There is nodular soft tissue thickening of the anterior supraglottic trachea (axial 41/5 and sagittal 38/9) at the base of the tongue measuring approximately 3.1 x 1.3 cm in axial dimensions and may represent enlarged adenoidal tissue. A mass is not excluded. Clinical correlation and further evaluation with direct visualization recommended. IMPRESSION: 1. No acute intracranial pathology. 2. No acute/traumatic cervical spine pathology. 3. Nodular soft tissue thickening of the anterior supraglottic trachea at the base of the tongue may represent enlarged adenoidal tissue. A mass is not excluded. Clinical correlation and further evaluation with direct visualization recommended. Electronically Signed   By: Anner Crete M.D.   On: 03/03/2020 16:32   DG Hip Unilat With Pelvis 2-3 Views Right  Result Date: 03/03/2020 CLINICAL DATA:  Status post fall.  Right hip pain. EXAM: DG HIP (WITH OR WITHOUT  PELVIS) 2-3V RIGHT COMPARISON:  None.  FINDINGS: There is no evidence of hip fracture or dislocation. No significant arthropathy. 1 cm sclerotic focus within the proximal right femur. Prior lower lumbosacral spine fusion. IMPRESSION: 1. No acute fracture or dislocation identified about the right hip. 2. 1 cm sclerotic focus within the proximal right femur. In the absence of malignancy capable of producing sclerotic lesions this may represent a bone island. Please correlate clinically. Electronically Signed   By: Fidela Salisbury M.D.   On: 03/03/2020 17:00    Procedures .Marland KitchenLaceration Repair  Date/Time: 03/03/2020 5:27 PM Performed by: Lennice Sites, DO Authorized by: Lennice Sites, DO   Consent:    Consent obtained:  Verbal   Consent given by:  Patient   Risks discussed:  Infection, need for additional repair, nerve damage, pain, poor cosmetic result, poor wound healing, retained foreign body, tendon damage and vascular damage   Alternatives discussed:  No treatment Anesthesia (see MAR for exact dosages):    Anesthesia method:  None Laceration details:    Location:  Face   Face location:  R eyebrow   Length (cm):  2   Depth (mm):  1 Repair type:    Repair type:  Simple Pre-procedure details:    Preparation:  Patient was prepped and draped in usual sterile fashion Exploration:    Wound exploration: wound explored through full range of motion     Wound extent: no areolar tissue violation noted, no fascia violation noted, no foreign bodies/material noted, no muscle damage noted, no nerve damage noted, no tendon damage noted, no underlying fracture noted and no vascular damage noted     Contaminated: no   Treatment:    Area cleansed with:  Saline   Amount of cleaning:  Standard   Irrigation solution:  Sterile saline   Irrigation method:  Pressure wash   Visualized foreign bodies/material removed: no   Skin repair:    Repair method:  Steri-Strips and tissue adhesive   Number of  Steri-Strips:  3 Approximation:    Approximation:  Close Post-procedure details:    Dressing:  Open (no dressing)   Patient tolerance of procedure:  Tolerated well, no immediate complications   (including critical care time)  Medications Ordered in ED Medications - No data to display  ED Course  I have reviewed the triage vital signs and the nursing notes.  Pertinent labs & imaging results that were available during my care of the patient were reviewed by me and considered in my medical decision making (see chart for details).    MDM Rules/Calculators/A&P                          Beverly Ballard is a 74 year old female with history of high cholesterol who presents the ED with mechanical fall. Small laceration to the right eyebrow that was repaired with Dermabond. Having some mild headache and right hip pain. Neurologically she is intact. Not on blood thinners. No other extremity pain. Will get head CT and neck CT and x-ray of the right hip.  CT scan of head and neck are unremarkable.  X-ray of the hip showed no fracture.  Likely bony island, however patient has had history of vaginal cancer.  Do not believe she is had a malignancy that would cause sclerotic lesions.  We will have her follow-up with her primary care doctor to further discuss.  Discharged in good condition.  This chart was dictated using voice recognition software.  Despite best efforts to proofread,  errors can occur which can change the documentation meaning.    Final Clinical Impression(s) / ED Diagnoses Final diagnoses:  Laceration of right eyebrow, initial encounter    Rx / DC Orders ED Discharge Orders    None       Lennice Sites, DO 03/03/20 1736

## 2020-03-05 ENCOUNTER — Ambulatory Visit (INDEPENDENT_AMBULATORY_CARE_PROVIDER_SITE_OTHER): Payer: Medicare HMO | Admitting: Family Medicine

## 2020-03-05 ENCOUNTER — Encounter: Payer: Self-pay | Admitting: Family Medicine

## 2020-03-05 ENCOUNTER — Ambulatory Visit (INDEPENDENT_AMBULATORY_CARE_PROVIDER_SITE_OTHER): Payer: Medicare HMO

## 2020-03-05 ENCOUNTER — Other Ambulatory Visit: Payer: Self-pay

## 2020-03-05 VITALS — HR 99 | Ht 65.0 in | Wt 254.0 lb

## 2020-03-05 DIAGNOSIS — S0181XA Laceration without foreign body of other part of head, initial encounter: Secondary | ICD-10-CM

## 2020-03-05 DIAGNOSIS — R079 Chest pain, unspecified: Secondary | ICD-10-CM | POA: Diagnosis not present

## 2020-03-05 DIAGNOSIS — S299XXA Unspecified injury of thorax, initial encounter: Secondary | ICD-10-CM

## 2020-03-05 DIAGNOSIS — R072 Precordial pain: Secondary | ICD-10-CM | POA: Diagnosis not present

## 2020-03-05 MED ORDER — OXYCODONE-ACETAMINOPHEN 5-325 MG PO TABS: 1.0000 | ORAL_TABLET | Freq: Four times a day (QID) | ORAL | 0 refills | Status: DC | PRN

## 2020-03-05 NOTE — Progress Notes (Signed)
Established Patient Office Visit  Subjective:  Patient ID: Beverly Ballard, female    DOB: Oct 06, 1945  Age: 74 y.o. MRN: [id number]  CC:  Chief Complaint  Patient presents with  . Fall    HPI Beverly Ballard presents for anterior chest pain following fall this past Saturday.  She was with her daughter.  She apparently tripped when walking when she was in a parking deck.  She lost her balance and fell onto her right side and did strike her right forehead.  She had laceration of her right eyebrow region.  There was no loss of consciousness.  She had some mild headache and was seen in the ER for further evaluation.  She had CT of the head and neck which showed no acute fractures or any acute bleed.  She had some mild right hip pains and x-rays revealed no acute fracture.  CT head and neck showed no acute intracranial pathology.  No acute traumatic injury of the cervical spine.  There was comment of some nodular soft tissue thickening of the anterior supraglottic trachea at the base of the tongue felt to represent probably adenoidal tissue.  Patient denies any sore throat symptoms or any adenopathy or any change in appetite or weight.  Her main complaint at this time is anterior chest pain involving her chest wall.  This pain is continuous and sore to palpation.  She has not seen any visible bruising.  She has tenderness over her sternum region.  No radiation of pain.  Pain has been severe at times.  She has tried Aleve without relief.  No cough.  No dyspnea.  Past Medical History:  Diagnosis Date  . Allergy   . Anxiety and depression 12/19/2009  . Arthritis   . Cancer (HCC)    vaginal  . Constipation   . Degenerative disorder of eye   . Diverticulosis   . Essential hypertension, benign 06/26/2009  . GERD 05/27/2007   takes Nexium daily  . Heart murmur    yrs ago  . History of gout   . History of migraine many yrs ago  . Hyperlipidemia    takes Pravastatin daily  . Hypothyroidism   .  Insomnia    but doesn't take any meds  . LOW BACK PAIN 05/27/2007  . Numbness    left toes   . Osteoporosis   . Seizures ([place])    last 1983, none since then- last seizure 1988 per pt     Past Surgical History:  Procedure Laterality Date  . ADENOIDECTOMY    . BACK SURGERY     lumbar x2  . BRONCHIAL BIOPSY  09/12/2019   Procedure: BRONCHIAL BIOPSIES;  Surgeon: Laurin Coder, MD;  Location: [facility];  Service: Endoscopy;;  . BRONCHIAL WASHINGS  09/12/2019   Procedure: BRONCHIAL WASHINGS;  Surgeon: Laurin Coder, MD;  Location: [facility];  Service: Endoscopy;;  . COLONOSCOPY  2009  . ESOPHAGOGASTRODUODENOSCOPY    . Heel spurs Bilateral   . HEMOSTASIS CONTROL  09/12/2019   Procedure: HEMOSTASIS CONTROL;  Surgeon: Laurin Coder, MD;  Location: [facility];  Service: Endoscopy;;  epi  . KNEE ARTHROPLASTY Left 11/07/2013   Procedure: COMPUTER ASSISTED TOTAL KNEE ARTHROPLASTY;  Surgeon: Marybelle Killings, MD;  Location: [facility];  Service: Orthopedics;  Laterality: Left;  Left Total Knee Arthroplasty, Cemented, Computer Assist  . TONSILLECTOMY    . TOTAL KNEE REVISION Left 09/19/2014   Procedure: LEFT TOTAL KNEE REVISION;  Surgeon: Georga Kaufmann  Ephriam Jenkins, MD;  Location: [facility];  Service: Orthopedics;  Laterality: Left;  . UPPER GASTROINTESTINAL ENDOSCOPY    . VIDEO BRONCHOSCOPY N/A 09/12/2019   Procedure: VIDEO BRONCHOSCOPY WITH FLUORO;  Surgeon: Laurin Coder, MD;  Location: [facility];  Service: Endoscopy;  Laterality: N/A;    Family History  Problem Relation Age of Onset  . Aortic dissection Father   . Arthritis Mother   . Stroke Mother 36  . Heart murmur Other   . Throat cancer Sister   . Colon cancer Neg Hx   . Esophageal cancer Neg Hx   . Stomach cancer Neg Hx   . Rectal cancer Neg Hx   . Colon polyps Neg Hx     Social History   Socioeconomic History  . Marital status: Married    Spouse name: Not on file  . Number of children: 1  . Years of education: Not on file   . Highest education level: Not on file  Occupational History  . Occupation: Retired  Tobacco Use  . Smoking status: Former Smoker    Packs/day: 0.25    Years: 20.00    Pack years: 5.00    Types: Cigarettes    Quit date: 09/08/1978    Years since quitting: 41.5  . Smokeless tobacco: Never Used  . Tobacco comment: quit smoking 67yrs ago  Vaping Use  . Vaping Use: Never used  Substance and Sexual Activity  . Alcohol use: No  . Drug use: No  . Sexual activity: Never    Birth control/protection: Post-menopausal  Other Topics Concern  . Not on file  Social History Narrative   Right handed    Lives with husband    Social Determinants of Health   Financial Resource Strain: Low Risk   . Difficulty of Paying Living Expenses: Not hard at all  Food Insecurity: No Food Insecurity  . Worried About Charity fundraiser in the Last Year: Never true  . Ran Out of Food in the Last Year: Never true  Transportation Needs: No Transportation Needs  . Lack of Transportation (Medical): No  . Lack of Transportation (Non-Medical): No  Physical Activity: Inactive  . Days of Exercise per Week: 0 days  . Minutes of Exercise per Session: 0 min  Stress: Stress Concern Present  . Feeling of Stress : To some extent  Social Connections: Socially Isolated  . Frequency of Communication with Friends and Family: Twice a week  . Frequency of Social Gatherings with Friends and Family: Never  . Attends Religious Services: Never  . Active Member of Clubs or Organizations: No  . Attends Archivist Meetings: Never  . Marital Status: Married  Human resources officer Violence: Not At Risk  . Fear of Current or Ex-Partner: No  . Emotionally Abused: No  . Physically Abused: No  . Sexually Abused: No    Outpatient Medications Prior to Visit  Medication Sig Dispense Refill  . albuterol (VENTOLIN HFA) 108 (90 Base) MCG/ACT inhaler Inhale 2 puffs into the lungs every 4 (four) hours as needed for wheezing or  shortness of breath. 18 g 0  . aspirin 81 MG tablet Take 81 mg by mouth daily.    Marland Kitchen b complex vitamins tablet Take 1 tablet by mouth daily.    . beta carotene w/minerals (OCUVITE) tablet Take 1 tablet by mouth daily.    . Cholecalciferol (VITAMIN D3 PO) Take by mouth.    . cyclobenzaprine (FLEXERIL) 10 MG tablet Take 1 tablet (10  mg total) by mouth 2 (two) times daily as needed for muscle spasms. 30 tablet 0  . EPINEPHrine 0.3 mg/0.3 mL IJ SOAJ injection SMARTSIG:Injection As Directed    . esomeprazole (NEXIUM) 20 MG capsule Take 2 capsules (40 mg total) by mouth daily before breakfast. (Patient taking differently: Take 20 mg by mouth daily before breakfast. ) 2 capsule 0  . furosemide (LASIX) 20 MG tablet TAKE 1 TABLET BY MOUTH IN THE MORNING 30 tablet 1  . levocetirizine (XYZAL) 5 MG tablet Take 5 mg by mouth daily.     Marland Kitchen levothyroxine (SYNTHROID) 88 MCG tablet Take 1 tablet (88 mcg total) by mouth daily. 90 tablet 3  . lisinopril (ZESTRIL) 5 MG tablet Take 1 tablet (5 mg total) by mouth daily. 90 tablet 1  . LORazepam (ATIVAN) 0.5 MG tablet Take 1 tablet (0.5 mg total) by mouth 4 (four) times daily as needed for anxiety. 60 tablet 0  . methocarbamol (ROBAXIN) 500 MG tablet Take 1 tablet (500 mg total) by mouth 2 (two) times daily as needed. 20 tablet 0  . ondansetron (ZOFRAN ODT) 4 MG disintegrating tablet Take 1 tablet (4 mg total) by mouth every 6 (six) hours as needed for nausea or vomiting. 30 tablet 3  . pravastatin (PRAVACHOL) 20 MG tablet Take 1 tablet (20 mg total) by mouth daily. TAKE 1 TABLET BY MOUTH EVERY DAY 90 tablet 1  . sertraline (ZOLOFT) 100 MG tablet Take 2 tablets (200 mg total) by mouth daily. 180 tablet 0  . Spacer/Aero-Holding Dorise Bullion Use as directed with inhaler 1 each 0  . zonisamide (ZONEGRAN) 100 MG capsule Take 2 capsules twice a day 360 capsule 3   No facility-administered medications prior to visit.    Allergies  Allergen Reactions  . Lamotrigine  Swelling    Tongue swelling  . Bupropion Other (See Comments)    not known  . Carbamazepine Other (See Comments)    Hypnatremia  . Levetiracetam     Other reaction(s): Dizziness (intolerance)  . Tramadol Other (See Comments)    not known  . Adhesive [Tape]     ?  Blister after knee surgery  . Clarithromycin     Unsure of reaction    . Doxycycline     Interfered with seizure medication  . Nickel     Had to remove knee implant with nickel and replace it  . Other     Metal and perfumes  . Topamax [Topiramate] Nausea And Vomiting  . Estradiol Rash  . Latex Rash  . Phenytoin Sodium Extended Other (See Comments)    drowsiness    ROS Review of Systems  Constitutional: Negative for chills and fever.  HENT: Negative for sore throat and trouble swallowing.   Respiratory: Negative for cough and shortness of breath.   Cardiovascular: Positive for chest pain. Negative for palpitations and leg swelling.  Gastrointestinal: Negative for abdominal pain.  Neurological: Negative for headaches.  Hematological: Negative for adenopathy.  Psychiatric/Behavioral: Negative for confusion.      Objective:    Physical Exam Vitals reviewed.  Constitutional:      Appearance: Normal appearance.  HENT:     Head:     Comments: She has Steri-Strips over laceration right eyebrow region.  No signs of secondary infection.  She has some mild bruising around the right eye.  Mild swelling around the right eye    Mouth/Throat:     Mouth: Mucous membranes are moist.     Pharynx: Oropharynx  is clear.  Eyes:     Extraocular Movements: Extraocular movements intact.  Cardiovascular:     Rate and Rhythm: Normal rate and regular rhythm.  Pulmonary:     Effort: Pulmonary effort is normal.     Breath sounds: Normal breath sounds. No wheezing or rales.     Comments: She has anterior chest wall tenderness over the sternum region.  No visible swelling Musculoskeletal:     Cervical back: Neck supple.    Neurological:     Mental Status: She is alert.     Pulse 99   Ht 5\' 5"  (1.651 m)   Wt 254 lb (115.2 kg)   SpO2 94%   BMI 42.27 kg/m  Wt Readings from Last 3 Encounters:  03/05/20 254 lb (115.2 kg)  03/03/20 250 lb (113.4 kg)  02/21/20 254 lb 6.4 oz (115.4 kg)     There are no preventive care reminders to display for this patient.  There are no preventive care reminders to display for this patient.  Lab Results  Component Value Date   TSH 2.23 10/12/2019   Lab Results  Component Value Date   WBC 11.3 (H) 12/20/2019   HGB 13.4 12/20/2019   HCT 41.9 12/20/2019   MCV 97.0 12/20/2019   PLT 226 12/20/2019   Lab Results  Component Value Date   NA 138 12/20/2019   K 4.4 12/20/2019   CO2 28 12/20/2019   GLUCOSE 108 (H) 12/20/2019   BUN 11 12/20/2019   CREATININE 0.94 12/20/2019   BILITOT 0.4 09/09/2019   ALKPHOS 93 09/09/2019   AST 16 09/09/2019   ALT 11 09/09/2019   PROT 7.5 09/09/2019   ALBUMIN 4.1 09/09/2019   CALCIUM 8.9 12/20/2019   ANIONGAP 9 12/20/2019   GFR 46.88 (L) 11/18/2019   Lab Results  Component Value Date   CHOL 176 12/30/2018   Lab Results  Component Value Date   HDL 43.20 12/30/2018   Lab Results  Component Value Date   LDLCALC 103 (H) 12/30/2018   Lab Results  Component Value Date   TRIG 151.0 (H) 12/30/2018   Lab Results  Component Value Date   CHOLHDL 4 12/30/2018   Lab Results  Component Value Date   HGBA1C 5.7 06/24/2019      Assessment & Plan:   #1 anterior chest pain following fall.  Suspect musculoskeletal.  She has tenderness to palpation.  Does not sound like she directly struck her sternum so relatively low suspicion for fracture. -Obtain chest x-ray and sternum films -Wrote for limited Percocet 5/325 mg 1 every 6 hours as needed for severe pain #12 with no refill -She is encouraged to try to take deep breaths periodically to avoid atelectasis  #2 right facial laceration-with Steri-Strips applied.  No signs of  secondary infection.  #3 nonspecific finding on CT of the head neck with nodular soft tissue thickening anterior supraglottic trachea at the base of the tongue.  Question adenoidal tissue.  We have recommended 2-week follow-up with primary to discuss further and to reassess and consider whether to refer at that point for further ENT evaluation She does not have any red flags such as sore throat, neck adenopathy, appetite loss, weight loss, etc.  Meds ordered this encounter  Medications  . oxyCODONE-acetaminophen (PERCOCET) 5-325 MG tablet    Sig: Take 1 tablet by mouth every 6 (six) hours as needed for severe pain.    Dispense:  12 tablet    Refill:  0    Follow-up:  Return in about 2 weeks (around 03/19/2020).    Carolann Littler, MD

## 2020-03-06 ENCOUNTER — Ambulatory Visit: Payer: Medicare HMO | Admitting: Orthopaedic Surgery

## 2020-03-06 ENCOUNTER — Ambulatory Visit: Payer: Medicare HMO | Admitting: Psychology

## 2020-03-06 ENCOUNTER — Telehealth: Payer: Self-pay

## 2020-03-06 NOTE — Telephone Encounter (Signed)
-----   Message from Eulas Post, MD sent at 03/06/2020 12:43 PM EST ----- No acute findings on CXR/sternum film    no fractures seen.

## 2020-03-06 NOTE — Telephone Encounter (Signed)
Patient informed of results.  

## 2020-03-07 ENCOUNTER — Encounter: Payer: Medicare HMO | Admitting: Physical Therapy

## 2020-03-09 ENCOUNTER — Encounter: Payer: Medicare HMO | Admitting: Physical Therapy

## 2020-03-09 DIAGNOSIS — J3081 Allergic rhinitis due to animal (cat) (dog) hair and dander: Secondary | ICD-10-CM | POA: Diagnosis not present

## 2020-03-09 DIAGNOSIS — J3089 Other allergic rhinitis: Secondary | ICD-10-CM | POA: Diagnosis not present

## 2020-03-09 DIAGNOSIS — G4733 Obstructive sleep apnea (adult) (pediatric): Secondary | ICD-10-CM | POA: Diagnosis not present

## 2020-03-09 DIAGNOSIS — J301 Allergic rhinitis due to pollen: Secondary | ICD-10-CM | POA: Diagnosis not present

## 2020-03-12 ENCOUNTER — Encounter: Payer: Medicare HMO | Admitting: Physical Therapy

## 2020-03-13 ENCOUNTER — Ambulatory Visit: Payer: Medicare HMO | Admitting: Podiatrist

## 2020-03-13 ENCOUNTER — Other Ambulatory Visit: Payer: Self-pay

## 2020-03-13 ENCOUNTER — Encounter: Payer: Self-pay | Admitting: Podiatrist

## 2020-03-13 ENCOUNTER — Ambulatory Visit (INDEPENDENT_AMBULATORY_CARE_PROVIDER_SITE_OTHER): Payer: Medicare HMO

## 2020-03-13 DIAGNOSIS — M216X9 Other acquired deformities of unspecified foot: Secondary | ICD-10-CM | POA: Diagnosis not present

## 2020-03-13 DIAGNOSIS — Z7409 Other reduced mobility: Secondary | ICD-10-CM | POA: Diagnosis not present

## 2020-03-13 DIAGNOSIS — M216X2 Other acquired deformities of left foot: Secondary | ICD-10-CM

## 2020-03-13 DIAGNOSIS — M216X1 Other acquired deformities of right foot: Secondary | ICD-10-CM

## 2020-03-13 DIAGNOSIS — M79672 Pain in left foot: Secondary | ICD-10-CM

## 2020-03-13 DIAGNOSIS — M79671 Pain in right foot: Secondary | ICD-10-CM | POA: Diagnosis not present

## 2020-03-13 DIAGNOSIS — M21279 Flexion deformity, unspecified ankle and toes: Secondary | ICD-10-CM | POA: Diagnosis not present

## 2020-03-13 NOTE — Progress Notes (Signed)
Chief Complaint  Patient presents with  . Arthritis    Refferred for osteoarthritis  . Gait Problem    Pt states gait issues and frequent falls     HPI: Patient is 74 y.o. female who presents today for a concern of balance and gait issues.  She states she has been participating in physical therapy and she has noticed some improvement recently.  She also states she took a bad fall and has just felt up to going back to therapy.    Patient Active Problem List   Diagnosis Date Noted  . Ankle edema, bilateral 10/31/2019  . Excessive daytime sleepiness 06/28/2019  . Shortness of breath 06/28/2019  . Healthcare maintenance 06/28/2019  . ANA positive 06/28/2019  . Bronchiectasis ([place]) 06/28/2019  . Depression, major, single episode, moderate ([place]) 11/19/2018  . Fatigue 03/23/2018  . Recurrent major depressive disorder, in partial remission ([place]) 05/04/2017  . Left knee pain 01/12/2017  . Ovarian cyst 10/30/2016  . Aortic atherosclerosis ([place]) 11/19/2015  . Hyperlipemia 04/03/2015  . S/P revision of total knee 09/19/2014  . Seizure disorder ([place]) 01/24/2014  . Obesity, BMI unknown 01/24/2014  . Osteopenia, stable on dexa 2010 - followed by gyn 06/15/2012  . Pap smear abnormality of cervix/human papillomavirus (HPV) positive - 09/2011, followed by gyn 06/15/2012  . Essential hypertension 07/03/2009  . Hypothyroidism 05/27/2007  . Allergic rhinitis 05/27/2007  . GERD 05/27/2007    Current Outpatient Medications on File Prior to Visit  Medication Sig Dispense Refill  . albuterol (VENTOLIN HFA) 108 (90 Base) MCG/ACT inhaler Inhale 2 puffs into the lungs every 4 (four) hours as needed for wheezing or shortness of breath. 18 g 0  . aspirin 81 MG tablet Take 81 mg by mouth daily.    Marland Kitchen b complex vitamins tablet Take 1 tablet by mouth daily.    . beta carotene w/minerals (OCUVITE) tablet Take 1 tablet by mouth daily.    . Cholecalciferol (VITAMIN D3 PO) Take by mouth.    . cyclobenzaprine  (FLEXERIL) 10 MG tablet Take 1 tablet (10 mg total) by mouth 2 (two) times daily as needed for muscle spasms. 30 tablet 0  . EPINEPHrine 0.3 mg/0.3 mL IJ SOAJ injection SMARTSIG:Injection As Directed    . esomeprazole (NEXIUM) 20 MG capsule Take 2 capsules (40 mg total) by mouth daily before breakfast. (Patient taking differently: Take 20 mg by mouth daily before breakfast. ) 2 capsule 0  . furosemide (LASIX) 20 MG tablet TAKE 1 TABLET BY MOUTH IN THE MORNING 30 tablet 1  . levocetirizine (XYZAL) 5 MG tablet Take 5 mg by mouth daily.     Marland Kitchen levothyroxine (SYNTHROID) 88 MCG tablet Take 1 tablet (88 mcg total) by mouth daily. 90 tablet 3  . lisinopril (ZESTRIL) 5 MG tablet Take 1 tablet (5 mg total) by mouth daily. 90 tablet 1  . LORazepam (ATIVAN) 0.5 MG tablet Take 1 tablet (0.5 mg total) by mouth 4 (four) times daily as needed for anxiety. 60 tablet 0  . methocarbamol (ROBAXIN) 500 MG tablet Take 1 tablet (500 mg total) by mouth 2 (two) times daily as needed. 20 tablet 0  . ondansetron (ZOFRAN ODT) 4 MG disintegrating tablet Take 1 tablet (4 mg total) by mouth every 6 (six) hours as needed for nausea or vomiting. 30 tablet 3  . pravastatin (PRAVACHOL) 20 MG tablet Take 1 tablet (20 mg total) by mouth daily. TAKE 1 TABLET BY MOUTH EVERY DAY 90 tablet 1  . sertraline (ZOLOFT)  100 MG tablet Take 2 tablets (200 mg total) by mouth daily. 180 tablet 0  . Spacer/Aero-Holding Dorise Bullion Use as directed with inhaler 1 each 0  . zonisamide (ZONEGRAN) 100 MG capsule Take 2 capsules twice a day 360 capsule 3  . oxyCODONE-acetaminophen (PERCOCET) 5-325 MG tablet Take 1 tablet by mouth every 6 (six) hours as needed for severe pain. (Patient not taking: Reported on 03/13/2020) 12 tablet 0   No current facility-administered medications on file prior to visit.    Allergies  Allergen Reactions  . Lamotrigine Swelling    Tongue swelling  . Bupropion Other (See Comments)    not known  . Carbamazepine Other  (See Comments)    Hypnatremia  . Levetiracetam     Other reaction(s): Dizziness (intolerance)  . Tramadol Other (See Comments)    not known  . Adhesive [Tape]     ?  Blister after knee surgery  . Clarithromycin     Unsure of reaction    . Doxycycline     Interfered with seizure medication  . Nickel     Had to remove knee implant with nickel and replace it  . Other     Metal and perfumes  . Topamax [Topiramate] Nausea And Vomiting  . Estradiol Rash  . Latex Rash  . Phenytoin Sodium Extended Other (See Comments)    drowsiness    Review of Systems No fevers, chills, nausea, muscle aches, no difficulty breathing, no calf pain, no chest pain or shortness of breath.   Physical Exam  GENERAL APPEARANCE: Alert, conversant. Appropriately groomed. No acute distress.   VASCULAR: Pedal pulses palpable DP and PT bilateral.  Capillary refill time is immediate to all digits,  Proximal to distal cooling it warm to warm.  Digital perfusion adequate.   NEUROLOGIC: sensation is intact to 5.07 monofilament at 5/5 sites bilateral.  Light touch is intact bilateral, vibratory sensation intact bilateral  MUSCULOSKELETAL:   No gross boney pedal deformities noted.  No pain, crepitus or limitation noted with foot and ankle range of motion bilateral. Forefoot cavus deformity is noted.  Decrease in dorsiflexion in mid-gait and upon heel strike noted.  Gait is a steppage type vs. Heel to toe-off noted. .    DERMATOLOGIC: skin is warm, supple, and dry.  No open lesions noted.  No rash, no pre ulcerative lesions. Digital nails are asymptomatic.    xrays show no gross boney abnormalities. No sign of arthritic changes noted.  No acute boney abnormalities seen.  Small inferior calcaneal spur is present left greater than right.  Contracture at the lesser digits specifically the second toe right foot is seen.   Assessment     ICD-10-CM   1. Cavus deformity of foot, acquired  M21.6X9   2. Pain in left foot   M79.672 DG Foot Complete Left  3. Pain in right foot  M79.671 DG Foot Complete Right  4. Dorsiflexion deformity of foot, unspecified laterality  M21.279   5. Impaired functional mobility, balance, gait, and endurance  Z74.09      Plan  Discussed exam and xray findings with the patient.  I recommended continuing with her physical therapy for strength and endurance as well as balance training.  She will follow up in our office with Liliane Channel who will evaluate her for and Ankle foot orthosis / balance brace in hopes to improve her sense of balance and ease of gait.

## 2020-03-14 ENCOUNTER — Encounter: Payer: Medicare HMO | Admitting: Physical Therapy

## 2020-03-14 ENCOUNTER — Other Ambulatory Visit: Payer: Self-pay | Admitting: Podiatrist

## 2020-03-14 DIAGNOSIS — M216X9 Other acquired deformities of unspecified foot: Secondary | ICD-10-CM

## 2020-03-19 ENCOUNTER — Other Ambulatory Visit: Payer: Self-pay

## 2020-03-19 ENCOUNTER — Ambulatory Visit (INDEPENDENT_AMBULATORY_CARE_PROVIDER_SITE_OTHER): Payer: Medicare HMO | Admitting: Family Medicine

## 2020-03-19 ENCOUNTER — Encounter: Payer: Self-pay | Admitting: Family Medicine

## 2020-03-19 VITALS — BP 118/50 | HR 82 | Temp 98.3°F | Ht 65.0 in | Wt 251.3 lb

## 2020-03-19 DIAGNOSIS — M8589 Other specified disorders of bone density and structure, multiple sites: Secondary | ICD-10-CM

## 2020-03-19 DIAGNOSIS — J309 Allergic rhinitis, unspecified: Secondary | ICD-10-CM | POA: Diagnosis not present

## 2020-03-19 DIAGNOSIS — I700 Atherosclerosis of aorta: Secondary | ICD-10-CM | POA: Diagnosis not present

## 2020-03-19 DIAGNOSIS — J352 Hypertrophy of adenoids: Secondary | ICD-10-CM

## 2020-03-19 DIAGNOSIS — I10 Essential (primary) hypertension: Secondary | ICD-10-CM | POA: Diagnosis not present

## 2020-03-19 DIAGNOSIS — E890 Postprocedural hypothyroidism: Secondary | ICD-10-CM | POA: Diagnosis not present

## 2020-03-19 DIAGNOSIS — E611 Iron deficiency: Secondary | ICD-10-CM

## 2020-03-19 DIAGNOSIS — K219 Gastro-esophageal reflux disease without esophagitis: Secondary | ICD-10-CM

## 2020-03-19 DIAGNOSIS — E785 Hyperlipidemia, unspecified: Secondary | ICD-10-CM

## 2020-03-19 DIAGNOSIS — G40909 Epilepsy, unspecified, not intractable, without status epilepticus: Secondary | ICD-10-CM | POA: Diagnosis not present

## 2020-03-19 DIAGNOSIS — R0602 Shortness of breath: Secondary | ICD-10-CM

## 2020-03-19 DIAGNOSIS — R69 Illness, unspecified: Secondary | ICD-10-CM | POA: Diagnosis not present

## 2020-03-19 MED ORDER — AEROCHAMBER PLUS MISC: 0 refills | Status: DC

## 2020-03-19 MED ORDER — SPACER/AERO-HOLDING CHAMBERS DEVI: 0 refills | Status: DC

## 2020-03-19 NOTE — Patient Instructions (Signed)
When you use spacing chamber - blow breath out into chamber, seal your lips around the end and then take 4 breaths for each puff of inhaler. You can pause between the puffs.

## 2020-03-19 NOTE — Progress Notes (Signed)
Beverly Ballard DOB: [birth year] Encounter date: 03/19/2020  This is a 74 y.o. female who presents with Chief Complaint  Patient presents with  . Follow-up    History of present illness:   Breathing - "I just can't do nothing"  Has had a few falls; not sure if she is losing consciousness, but did see neurology. Had EEG completed. Saw her just this month, but everything was thought to be stable with epilepsy.   Therapy was helping with legs and wants to restart this.    HTN: On lisinopril 5mg  daily.  GERD:on nexium for this. Occasional symptoms, but doubles up for a week and then it resolves.  Hypothyroid:synthroid 36mcg daily. Follows with Dr. Cruzita Lederer.  Seizure disorder: last seizure 1988. Does still follow with neurology in [place].  Osteopenia: followed by gyn (Dr. Benjie Karvonen); last DEXA 03/2016 -bone density has been stable. Allergies: getting allergy shot.   HL:on pravastatin 20mg  daily. No side effects with medication.   Depression/Fatigue:  IBS: bowels have been doing ok. No blood in stools, no diarrhea.   Arthritis: followed up with foot doc; told no arthritis in her feet, but she is going back to get fitted for arch. She has a scan set up in a couple of weeks to get orthotics to see if this helps with walking.   CT cervical spine: obtained 11/13 was stable, but mention of: Other: There is nodular soft tissue thickening of the anterior supraglottic trachea (axial 41/5 and sagittal 38/9) at the base of the tongue measuring approximately 3.1 x 1.3 cm in axial dimensions and may represent enlarged adenoidal tissue. A mass is not excluded. Clinical correlation and further evaluation with direct visualization recommended.  Sleep sometimes good; sometimes not so good. Doesn't go to sleep until 12-1am. Just sits. Usually sleeps then until 7-9am range. Sometimes naps.   Allergies  Allergen Reactions  . Lamotrigine Swelling    Tongue swelling  . Bupropion Other (See  Comments)    not known  . Carbamazepine Other (See Comments)    Hypnatremia  . Levetiracetam     Other reaction(s): Dizziness (intolerance)  . Tramadol Other (See Comments)    not known  . Adhesive [Tape]     ?  Blister after knee surgery  . Clarithromycin     Unsure of reaction    . Doxycycline     Interfered with seizure medication  . Nickel     Had to remove knee implant with nickel and replace it  . Other     Metal and perfumes  . Topamax [Topiramate] Nausea And Vomiting  . Estradiol Rash  . Latex Rash  . Phenytoin Sodium Extended Other (See Comments)    drowsiness   Current Meds  Medication Sig  . albuterol (VENTOLIN HFA) 108 (90 Base) MCG/ACT inhaler Inhale 2 puffs into the lungs every 4 (four) hours as needed for wheezing or shortness of breath.  Marland Kitchen aspirin 81 MG tablet Take 81 mg by mouth daily.  Marland Kitchen b complex vitamins tablet Take 1 tablet by mouth daily.  . beta carotene w/minerals (OCUVITE) tablet Take 1 tablet by mouth daily.  . Cholecalciferol (VITAMIN D3 PO) Take by mouth.  . cyclobenzaprine (FLEXERIL) 10 MG tablet Take 1 tablet (10 mg total) by mouth 2 (two) times daily as needed for muscle spasms.  Marland Kitchen EPINEPHrine 0.3 mg/0.3 mL IJ SOAJ injection SMARTSIG:Injection As Directed  . esomeprazole (NEXIUM) 20 MG capsule Take 2 capsules (40 mg total) by mouth daily before breakfast. (  Patient taking differently: Take 20 mg by mouth daily before breakfast. )  . furosemide (LASIX) 20 MG tablet TAKE 1 TABLET BY MOUTH IN THE MORNING  . levocetirizine (XYZAL) 5 MG tablet Take 5 mg by mouth daily.   Marland Kitchen levothyroxine (SYNTHROID) 88 MCG tablet Take 1 tablet (88 mcg total) by mouth daily.  Marland Kitchen lisinopril (ZESTRIL) 5 MG tablet Take 1 tablet (5 mg total) by mouth daily.  Marland Kitchen LORazepam (ATIVAN) 0.5 MG tablet Take 1 tablet (0.5 mg total) by mouth 4 (four) times daily as needed for anxiety.  . methocarbamol (ROBAXIN) 500 MG tablet Take 1 tablet (500 mg total) by mouth 2 (two) times daily as  needed.  . ondansetron (ZOFRAN ODT) 4 MG disintegrating tablet Take 1 tablet (4 mg total) by mouth every 6 (six) hours as needed for nausea or vomiting.  Marland Kitchen oxyCODONE-acetaminophen (PERCOCET) 5-325 MG tablet Take 1 tablet by mouth every 6 (six) hours as needed for severe pain.  . pravastatin (PRAVACHOL) 20 MG tablet Take 1 tablet (20 mg total) by mouth daily. TAKE 1 TABLET BY MOUTH EVERY DAY  . sertraline (ZOLOFT) 100 MG tablet Take 2 tablets (200 mg total) by mouth daily.  Marland Kitchen Spacer/Aero-Holding Dorise Bullion Use as directed with inhaler  . zonisamide (ZONEGRAN) 100 MG capsule Take 2 capsules twice a day  . [DISCONTINUED] Spacer/Aero-Holding Dorise Bullion Use as directed with inhaler    Review of Systems  Constitutional: Positive for fatigue. Negative for chills and fever.  HENT: Negative for trouble swallowing.   Respiratory: Positive for shortness of breath. Negative for cough (intermittent), chest tightness and wheezing.   Cardiovascular: Negative for chest pain, palpitations and leg swelling.  Gastrointestinal: Negative for abdominal pain.  Musculoskeletal: Positive for arthralgias (knee pain improving after recent fall).  Psychiatric/Behavioral: Sleep disturbance: doesn't go to sleep on time.       She is just very frustrated with her current lack of ability to do anything; she is missing out on family activities since she cannot walk distances.    Objective:  BP (!) 118/50 (BP Location: Left Arm, Patient Position: Sitting, Cuff Size: Large)   Pulse 82   Temp 98.3 F (36.8 C) (Oral)   Ht 5\' 5"  (1.651 m)   Wt 251 lb 4.8 oz (114 kg)   SpO2 94%   BMI 41.82 kg/m   Weight: 251 lb 4.8 oz (114 kg)   BP Readings from Last 3 Encounters:  03/19/20 (!) 118/50  03/03/20 132/78  02/21/20 137/74   Wt Readings from Last 3 Encounters:  03/19/20 251 lb 4.8 oz (114 kg)  03/05/20 254 lb (115.2 kg)  03/03/20 250 lb (113.4 kg)    Physical Exam Constitutional:      General: She is not in  acute distress.    Appearance: She is well-developed.  Cardiovascular:     Rate and Rhythm: Normal rate and regular rhythm.     Heart sounds: Normal heart sounds. No murmur heard.  No friction rub.  Pulmonary:     Effort: Pulmonary effort is normal. No respiratory distress.     Breath sounds: Decreased breath sounds present. No wheezing or rales.  Musculoskeletal:     Right lower leg: No edema.     Left lower leg: No edema.     Comments: Significant ecchymosis over right knee; no obvious effusion or swelling.  Neurological:     Mental Status: She is alert and oriented to person, place, and time.  Psychiatric:  Attention and Perception: Attention normal.        Mood and Affect: Affect is tearful.        Speech: Speech is delayed.        Behavior: Behavior normal.        Thought Content: Thought content does not include suicidal plan.     Assessment/Plan  1. Enlarged adenoids Patient states that adenoids were removed when she was younger.  Due to concern on recent CT, we will send her for evaluation with ENT for direct visualization of this area of concern noted on imaging.  This was discussed with the patient. - Ambulatory referral to ENT  2. Essential hypertension Recheck of blood pressure was slightly higher in the office.  I have encouraged her to check at home because if she is running on the lower end, we could cut back on medications.  We continue to help from a fatigue standpoint we'll bring her some slight improvements.  She is currently taking lisinopril 5 mg daily as well as Lasix 20 mg daily. - CBC with Differential/Platelet; Future - Comprehensive metabolic panel; Future - Comprehensive metabolic panel - CBC with Differential/Platelet  3. Aortic atherosclerosis ([place]) She is on pravastatin 20 mg daily.  4. Allergic rhinitis, unspecified seasonality, unspecified trigger She has seen allergy.  She does get allergy shots.  She is on Xyzal 5 mg daily.  5.  Gastroesophageal reflux disease, unspecified whether esophagitis present Currently taking Nexium 40 mg daily.  Symptoms have been stable.  6. Postablative hypothyroidism She was not sure when she had follow-up with endocrinology, and I did not see an appointment scheduled, so we did add thyroid onto blood work today.  She is currently taking 88 mcg of Synthroid and has been well controlled on this. - TSH; Future - T4, free; Future - T4, free - TSH  7. Seizure disorder [facility]) Following with neurology.  She does take Zonegran 200 mg twice daily.  8. Osteopenia of multiple sites She is taking D3.  We discussed importance of regular weightbearing exercise.  9. Hyperlipidemia, unspecified hyperlipidemia type Stable on current dose of pravastatin.  See above. - Lipid panel; Future - Lipid panel  10. Iron deficiency We'll recheck to make sure stable. - Iron, TIBC and Ferritin Panel; Future - Iron, TIBC and Ferritin Panel  11. Shortness of breath She has followed with pulmonology.  She continues to have issues with breathing.  She did feel somewhat better when she is doing physical therapy for her lower extremities as this seemed to help her with overall strength, although not breathing.  She reports some relief with albuterol used with spacer when she was in the hospital recently, so I have sent in a spacer for her to try.  I also will reach out to pulmonology to see if there is anything additional she should be doing to help with breathing.  We did review proper technique for spacer use. - Spacer/Aero-Holding Chambers (AEROCHAMBER PLUS) inhaler; Use as instructed  Dispense: 1 each; Refill: 0 - Spacer/Aero-Holding Dorise Bullion; Use as directed with inhaler  Dispense: 1 each; Refill: 0   Return in about 6 months (around 09/16/2020) for physical exam. 50 minutes in total spent with charting, chart review, time with patient, exam.   Micheline Rough, MD

## 2020-03-20 ENCOUNTER — Ambulatory Visit (INDEPENDENT_AMBULATORY_CARE_PROVIDER_SITE_OTHER): Payer: Medicare HMO | Admitting: Psychology

## 2020-03-20 DIAGNOSIS — R69 Illness, unspecified: Secondary | ICD-10-CM | POA: Diagnosis not present

## 2020-03-20 DIAGNOSIS — F331 Major depressive disorder, recurrent, moderate: Secondary | ICD-10-CM

## 2020-03-20 LAB — CBC WITH DIFFERENTIAL/PLATELET
Absolute Monocytes: 611 cells/uL (ref 200–950)
Basophils Absolute: 28 cells/uL (ref 0–200)
Basophils Relative: 0.3 %
Eosinophils Absolute: 9 cells/uL — ABNORMAL LOW (ref 15–500)
Eosinophils Relative: 0.1 %
HCT: 39.4 % (ref 35.0–45.0)
Hemoglobin: 13 g/dL (ref 11.7–15.5)
Lymphs Abs: 1673 cells/uL (ref 850–3900)
MCH: 30.3 pg (ref 27.0–33.0)
MCHC: 33 g/dL (ref 32.0–36.0)
MCV: 91.8 fL (ref 80.0–100.0)
MPV: 11.1 fL (ref 7.5–12.5)
Monocytes Relative: 6.5 %
Neutro Abs: 7078 cells/uL (ref 1500–7800)
Neutrophils Relative %: 75.3 %
Platelets: 274 10*3/uL (ref 140–400)
RBC: 4.29 10*6/uL (ref 3.80–5.10)
RDW: 11.9 % (ref 11.0–15.0)
Total Lymphocyte: 17.8 %
WBC: 9.4 10*3/uL (ref 3.8–10.8)

## 2020-03-20 LAB — LIPID PANEL
Cholesterol: 172 mg/dL (ref <200)
HDL: 50 mg/dL (ref >=50)
LDL Cholesterol (Calc): 100 mg/dL (calc) — ABNORMAL HIGH
Non-HDL Cholesterol (Calc): 122 mg/dL (calc) (ref <130)
Total CHOL/HDL Ratio: 3.4 (calc) (ref <5.0)
Triglycerides: 123 mg/dL (ref <150)

## 2020-03-20 LAB — IRON,TIBC AND FERRITIN PANEL
%SAT: 17 % (calc) (ref 16–45)
Ferritin: 38 ng/mL (ref 16–288)
Iron: 56 ug/dL (ref 45–160)
TIBC: 331 mcg/dL (calc) (ref 250–450)

## 2020-03-20 LAB — COMPREHENSIVE METABOLIC PANEL
AG Ratio: 1.1 (calc) (ref 1.0–2.5)
ALT: 11 U/L (ref 6–29)
AST: 24 U/L (ref 10–35)
Albumin: 3.9 g/dL (ref 3.6–5.1)
Alkaline phosphatase (APISO): 101 U/L (ref 37–153)
BUN: 10 mg/dL (ref 7–25)
CO2: 22 mmol/L (ref 20–32)
Calcium: 9.2 mg/dL (ref 8.6–10.4)
Chloride: 107 mmol/L (ref 98–110)
Creat: 0.72 mg/dL (ref 0.60–0.93)
Globulin: 3.5 g/dL (calc) (ref 1.9–3.7)
Glucose, Bld: 109 mg/dL — ABNORMAL HIGH (ref 65–99)
Potassium: 4.8 mmol/L (ref 3.5–5.3)
Sodium: 140 mmol/L (ref 135–146)
Total Bilirubin: 0.3 mg/dL (ref 0.2–1.2)
Total Protein: 7.4 g/dL (ref 6.1–8.1)

## 2020-03-20 LAB — TSH: TSH: 2.14 mIU/L (ref 0.40–4.50)

## 2020-03-20 LAB — T4, FREE: Free T4: 1 ng/dL (ref 0.8–1.8)

## 2020-03-26 ENCOUNTER — Other Ambulatory Visit: Payer: Self-pay | Admitting: Physician Assistant

## 2020-03-28 ENCOUNTER — Encounter: Payer: Medicare HMO | Admitting: Physical Therapy

## 2020-03-29 ENCOUNTER — Ambulatory Visit (INDEPENDENT_AMBULATORY_CARE_PROVIDER_SITE_OTHER): Payer: Medicare HMO | Admitting: Otolaryngology

## 2020-03-29 ENCOUNTER — Other Ambulatory Visit: Payer: Self-pay

## 2020-03-29 VITALS — Temp 96.8°F

## 2020-03-29 DIAGNOSIS — J351 Hypertrophy of tonsils: Secondary | ICD-10-CM | POA: Diagnosis not present

## 2020-03-29 NOTE — Progress Notes (Signed)
HPI: Beverly Ballard is a 74 y.o. female who presents is referred by her PCP for evaluation of abnormality noted on recent CT scan of her head and neck following a fall.  On the report they saw some fullness of tissue in the region of the base of tongue referring to this as enlarged adenoidal tissue or other and recommended direct examination.  Patient denies sore throat. She is status post tonsillectomy at age 63 or 66.  Past Medical History:  Diagnosis Date  . Allergy   . Anxiety and depression 12/19/2009  . Arthritis   . Cancer (HCC)    vaginal  . Constipation   . Degenerative disorder of eye   . Diverticulosis   . Essential hypertension, benign 06/26/2009  . GERD 05/27/2007   takes Nexium daily  . Heart murmur    yrs ago  . History of gout   . History of migraine many yrs ago  . Hyperlipidemia    takes Pravastatin daily  . Hypothyroidism   . Insomnia    but doesn't take any meds  . LOW BACK PAIN 05/27/2007  . Numbness    left toes   . Osteoporosis   . Seizures ([place])    last 1983, none since then- last seizure 1988 per pt    Past Surgical History:  Procedure Laterality Date  . ADENOIDECTOMY    . BACK SURGERY     lumbar x2  . BRONCHIAL BIOPSY  09/12/2019   Procedure: BRONCHIAL BIOPSIES;  Surgeon: Laurin Coder, MD;  Location: [facility];  Service: Endoscopy;;  . BRONCHIAL WASHINGS  09/12/2019   Procedure: BRONCHIAL WASHINGS;  Surgeon: Laurin Coder, MD;  Location: [facility];  Service: Endoscopy;;  . COLONOSCOPY  2009  . ESOPHAGOGASTRODUODENOSCOPY    . Heel spurs Bilateral   . HEMOSTASIS CONTROL  09/12/2019   Procedure: HEMOSTASIS CONTROL;  Surgeon: Laurin Coder, MD;  Location: [facility];  Service: Endoscopy;;  epi  . KNEE ARTHROPLASTY Left 11/07/2013   Procedure: COMPUTER ASSISTED TOTAL KNEE ARTHROPLASTY;  Surgeon: Marybelle Killings, MD;  Location: [place];  Service: Orthopedics;  Laterality: Left;  Left Total Knee Arthroplasty, Cemented, Computer Assist  .  TONSILLECTOMY    . TOTAL KNEE REVISION Left 09/19/2014   Procedure: LEFT TOTAL KNEE REVISION;  Surgeon: Leandrew Koyanagi, MD;  Location: [place];  Service: Orthopedics;  Laterality: Left;  . UPPER GASTROINTESTINAL ENDOSCOPY    . VIDEO BRONCHOSCOPY N/A 09/12/2019   Procedure: VIDEO BRONCHOSCOPY WITH FLUORO;  Surgeon: Laurin Coder, MD;  Location: [facility];  Service: Endoscopy;  Laterality: N/A;   Social History   Socioeconomic History  . Marital status: Married    Spouse name: Not on file  . Number of children: 1  . Years of education: Not on file  . Highest education level: Not on file  Occupational History  . Occupation: Retired  Tobacco Use  . Smoking status: Former Smoker    Packs/day: 0.25    Years: 20.00    Pack years: 5.00    Types: Cigarettes    Quit date: 09/08/1978    Years since quitting: 41.5  . Smokeless tobacco: Never Used  . Tobacco comment: quit smoking 56yrs ago  Vaping Use  . Vaping Use: Never used  Substance and Sexual Activity  . Alcohol use: No  . Drug use: No  . Sexual activity: Never    Birth control/protection: Post-menopausal  Other Topics Concern  . Not on file  Social History  Narrative   Right handed    Lives with husband    Social Determinants of Health   Financial Resource Strain: Low Risk   . Difficulty of Paying Living Expenses: Not hard at all  Food Insecurity: No Food Insecurity  . Worried About Charity fundraiser in the Last Year: Never true  . Ran Out of Food in the Last Year: Never true  Transportation Needs: No Transportation Needs  . Lack of Transportation (Medical): No  . Lack of Transportation (Non-Medical): No  Physical Activity: Inactive  . Days of Exercise per Week: 0 days  . Minutes of Exercise per Session: 0 min  Stress: Stress Concern Present  . Feeling of Stress : To some extent  Social Connections: Socially Isolated  . Frequency of Communication with Friends and Family: Twice a week  . Frequency of Social  Gatherings with Friends and Family: Never  . Attends Religious Services: Never  . Active Member of Clubs or Organizations: No  . Attends Archivist Meetings: Never  . Marital Status: Married   Family History  Problem Relation Age of Onset  . Aortic dissection Father   . Arthritis Mother   . Stroke Mother 49  . Heart murmur Other   . Throat cancer Sister   . Colon cancer Neg Hx   . Esophageal cancer Neg Hx   . Stomach cancer Neg Hx   . Rectal cancer Neg Hx   . Colon polyps Neg Hx    Allergies  Allergen Reactions  . Lamotrigine Swelling    Tongue swelling  . Bupropion Other (See Comments)    not known  . Carbamazepine Other (See Comments)    Hypnatremia  . Levetiracetam     Other reaction(s): Dizziness (intolerance)  . Tramadol Other (See Comments)    not known  . Adhesive [Tape]     ?  Blister after knee surgery  . Clarithromycin     Unsure of reaction    . Doxycycline     Interfered with seizure medication  . Nickel     Had to remove knee implant with nickel and replace it  . Other     Metal and perfumes  . Topamax [Topiramate] Nausea And Vomiting  . Estradiol Rash  . Latex Rash  . Phenytoin Sodium Extended Other (See Comments)    drowsiness   Prior to Admission medications   Medication Sig Start Date End Date Taking? Authorizing Provider  albuterol (VENTOLIN HFA) 108 (90 Base) MCG/ACT inhaler Inhale 2 puffs into the lungs every 4 (four) hours as needed for wheezing or shortness of breath. 04/01/19   Caren Macadam, MD  aspirin 81 MG tablet Take 81 mg by mouth daily.    [provider]  b complex vitamins tablet Take 1 tablet by mouth daily.    [provider]  beta carotene w/minerals (OCUVITE) tablet Take 1 tablet by mouth daily.    [provider]  Cholecalciferol (VITAMIN D3 PO) Take by mouth.    [provider]  cyclobenzaprine (FLEXERIL) 10 MG tablet Take 1 tablet (10 mg total) by mouth 2 (two) times  daily as needed for muscle spasms. 06/03/19   Aundra Dubin, PA-C  EPINEPHrine 0.3 mg/0.3 mL IJ SOAJ injection SMARTSIG:Injection As Directed 09/14/19   [provider]  esomeprazole (NEXIUM) 20 MG capsule Take 2 capsules (40 mg total) by mouth daily before breakfast. Patient taking differently: Take 20 mg by mouth daily before breakfast.  03/21/19  Laurey Morale, MD  furosemide (LASIX) 20 MG tablet TAKE 1 TABLET BY MOUTH IN THE MORNING 01/23/20   Mannam, Praveen, MD  levocetirizine (XYZAL) 5 MG tablet Take 5 mg by mouth daily.  03/05/16   [provider]  levothyroxine (SYNTHROID) 88 MCG tablet Take 1 tablet (88 mcg total) by mouth daily. 07/04/19   Philemon Kingdom, MD  lisinopril (ZESTRIL) 5 MG tablet Take 1 tablet (5 mg total) by mouth daily. 10/31/19   Koberlein, Steele Berg, MD  LORazepam (ATIVAN) 0.5 MG tablet Take 1 tablet (0.5 mg total) by mouth 4 (four) times daily as needed for anxiety. 12/05/19   Addison Lank, PA-C  methocarbamol (ROBAXIN) 500 MG tablet Take 1 tablet (500 mg total) by mouth 2 (two) times daily as needed. 01/27/20   Aundra Dubin, PA-C  ondansetron (ZOFRAN ODT) 4 MG disintegrating tablet Take 1 tablet (4 mg total) by mouth every 6 (six) hours as needed for nausea or vomiting. 03/30/19   Caren Macadam, MD  oxyCODONE-acetaminophen (PERCOCET) 5-325 MG tablet Take 1 tablet by mouth every 6 (six) hours as needed for severe pain. 03/05/20   Burchette, Alinda Sierras, MD  pravastatin (PRAVACHOL) 20 MG tablet Take 1 tablet (20 mg total) by mouth daily. TAKE 1 TABLET BY MOUTH EVERY DAY 10/31/19   Caren Macadam, MD  sertraline (ZOLOFT) 100 MG tablet TAKE 2 TABLETS BY MOUTH EVERY DAY 03/27/20   Donnal Moat T, PA-C  Spacer/Aero-Holding Chambers (AEROCHAMBER PLUS) inhaler Use as instructed 03/19/20   Caren Macadam, MD  Spacer/Aero-Holding Dorise Bullion Use as directed with inhaler 03/19/20   Caren Macadam, MD  zonisamide (ZONEGRAN) 100 MG capsule  Take 2 capsules twice a day 02/14/20   Cameron Sprang, MD     Positive ROS: Otherwise negative  All other systems have been reviewed and were otherwise negative with the exception of those mentioned in the HPI and as above.  Physical Exam: Constitutional: Alert, well-appearing, no acute distress Ears: External ears without lesions or tenderness. Ear canals are clear bilaterally with intact, clear TMs bilaterally..  Nasal: External nose without lesions. Septum slightly deviated to the right with mild rhinitis.. Clear nasal passages otherwise. Oral: Lips and gums without lesions. Tongue and palate mucosa without lesions. Posterior oropharynx clear.  Patient is status post tonsillectomy.  Palpation of the base of tongue was soft with no palpable masses. Fiberoptic laryngoscopy was performed to the left nostril.  The nasopharynx was clear with no significant adenoid tissue noted.  On exam of the base of tongue she has prominent lingual tonsil tissue which is symmetric and benign in appearance and I suspect this is what they are referring to on the neck CT scan.  Both piriform sinuses were clear.  Epiglottis was normal.  Vocal cords were normal. Neck: No palpable adenopathy or masses Respiratory: Breathing comfortably  Skin: No facial/neck lesions or rash noted.  Laryngoscopy  Date/Time: 03/29/2020 3:53 PM Performed by: Rozetta Nunnery, MD Authorized by: Rozetta Nunnery, MD   Consent:    Consent obtained:  Verbal   Consent given by:  Patient Procedure details:    Indications: direct visualization of the upper aerodigestive tract     Instrument: flexible fiberoptic laryngoscope     Scope location: left nare   Sinus:    Left nasopharynx: normal   Mouth:    Oropharynx: normal     Vallecula: normal     Base of tongue: normal  Epiglottis: normal   Throat:    True vocal cords: normal   Comments:     On fiberoptic laryngoscopy patient had a clear hypopharynx and larynx  with moderate hypertrophy of the lingual tonsil tissue but no significant pathology noted.    Assessment: Abnormality noted on recent CT scan is consistent with prominent lingual tonsil tissue which appears benign on clinical exam.  Plan: Reassured patient of normal exam with no evidence of neoplasm.   Radene Journey, MD   CC:

## 2020-03-30 ENCOUNTER — Telehealth: Payer: Self-pay | Admitting: Family Medicine

## 2020-03-30 ENCOUNTER — Emergency Department (HOSPITAL_COMMUNITY)
Admission: EM | Admit: 2020-03-30 | Discharge: 2020-03-30 | Disposition: A | Discharge status: Home / Self Care | Payer: Medicare HMO | Attending: Emergency Medicine | Admitting: Emergency Medicine

## 2020-03-30 ENCOUNTER — Encounter (HOSPITAL_COMMUNITY): Payer: Self-pay

## 2020-03-30 ENCOUNTER — Other Ambulatory Visit: Payer: Self-pay

## 2020-03-30 DIAGNOSIS — R42 Dizziness and giddiness: Secondary | ICD-10-CM | POA: Diagnosis not present

## 2020-03-30 DIAGNOSIS — Z87891 Personal history of nicotine dependence: Secondary | ICD-10-CM | POA: Diagnosis not present

## 2020-03-30 DIAGNOSIS — R0602 Shortness of breath: Secondary | ICD-10-CM | POA: Diagnosis not present

## 2020-03-30 DIAGNOSIS — Z8544 Personal history of malignant neoplasm of other female genital organs: Secondary | ICD-10-CM | POA: Insufficient documentation

## 2020-03-30 DIAGNOSIS — Z96652 Presence of left artificial knee joint: Secondary | ICD-10-CM | POA: Insufficient documentation

## 2020-03-30 DIAGNOSIS — R609 Edema, unspecified: Secondary | ICD-10-CM | POA: Diagnosis not present

## 2020-03-30 DIAGNOSIS — I10 Essential (primary) hypertension: Secondary | ICD-10-CM | POA: Diagnosis not present

## 2020-03-30 DIAGNOSIS — Z7982 Long term (current) use of aspirin: Secondary | ICD-10-CM | POA: Insufficient documentation

## 2020-03-30 DIAGNOSIS — Z91040 Latex allergy status: Secondary | ICD-10-CM | POA: Insufficient documentation

## 2020-03-30 DIAGNOSIS — Z79899 Other long term (current) drug therapy: Secondary | ICD-10-CM | POA: Insufficient documentation

## 2020-03-30 DIAGNOSIS — R55 Syncope and collapse: Secondary | ICD-10-CM | POA: Diagnosis not present

## 2020-03-30 DIAGNOSIS — E039 Hypothyroidism, unspecified: Secondary | ICD-10-CM | POA: Diagnosis not present

## 2020-03-30 DIAGNOSIS — R0600 Dyspnea, unspecified: Secondary | ICD-10-CM | POA: Diagnosis not present

## 2020-03-30 DIAGNOSIS — R0902 Hypoxemia: Secondary | ICD-10-CM | POA: Diagnosis not present

## 2020-03-30 DIAGNOSIS — R000 Tachycardia, unspecified: Secondary | ICD-10-CM | POA: Diagnosis not present

## 2020-03-30 LAB — CBC WITH DIFFERENTIAL/PLATELET
Abs Immature Granulocytes: 0.02 10*3/uL (ref 0.00–0.07)
Basophils Absolute: 0 10*3/uL (ref 0.0–0.1)
Basophils Relative: 0 %
Eosinophils Absolute: 0 10*3/uL (ref 0.0–0.5)
Eosinophils Relative: 0 %
HCT: 41.3 % (ref 36.0–46.0)
Hemoglobin: 13.1 g/dL (ref 12.0–15.0)
Immature Granulocytes: 0 %
Lymphocytes Relative: 23 %
Lymphs Abs: 1.8 10*3/uL (ref 0.7–4.0)
MCH: 30.7 pg (ref 26.0–34.0)
MCHC: 31.7 g/dL (ref 30.0–36.0)
MCV: 96.7 fL (ref 80.0–100.0)
Monocytes Absolute: 0.5 10*3/uL (ref 0.1–1.0)
Monocytes Relative: 7 %
Neutro Abs: 5.4 10*3/uL (ref 1.7–7.7)
Neutrophils Relative %: 70 %
Platelets: 214 10*3/uL (ref 150–400)
RBC: 4.27 MIL/uL (ref 3.87–5.11)
RDW: 12.9 % (ref 11.5–15.5)
WBC: 7.8 10*3/uL (ref 4.0–10.5)
nRBC: 0 % (ref 0.0–0.2)

## 2020-03-30 LAB — URINALYSIS, ROUTINE W REFLEX MICROSCOPIC
Bilirubin Urine: NEGATIVE
Glucose, UA: NEGATIVE mg/dL
Hgb urine dipstick: NEGATIVE
Ketones, ur: NEGATIVE mg/dL
Nitrite: NEGATIVE
Protein, ur: NEGATIVE mg/dL
Specific Gravity, Urine: 1.004 — ABNORMAL LOW (ref 1.005–1.030)
pH: 6 (ref 5.0–8.0)

## 2020-03-30 LAB — BASIC METABOLIC PANEL
Anion gap: 3 — ABNORMAL LOW (ref 5–15)
BUN: 11 mg/dL (ref 8–23)
CO2: 30 mmol/L (ref 22–32)
Calcium: 9.1 mg/dL (ref 8.9–10.3)
Chloride: 106 mmol/L (ref 98–111)
Creatinine, Ser: 0.84 mg/dL (ref 0.44–1.00)
GFR, Estimated: >60 mL/min (ref >60)
Glucose, Bld: 107 mg/dL — ABNORMAL HIGH (ref 70–99)
Potassium: 4 mmol/L (ref 3.5–5.1)
Sodium: 139 mmol/L (ref 135–145)

## 2020-03-30 NOTE — Telephone Encounter (Signed)
pt  need to speak with the nurse; she has some concern about her oxygen levels being low [phone]

## 2020-03-30 NOTE — ED Notes (Signed)
Patient provided ginger ale, cheese, and crackers, attempted to ambulate patient in hallway and patient became dizzy and light headed.

## 2020-03-30 NOTE — Telephone Encounter (Signed)
Thank you for doing all of that. Really appreciate you. I will follow along with chart as I imagine she will go to hospital.

## 2020-03-30 NOTE — ED Notes (Signed)
Patient got lightheaded when standing for the first set of standing vitals and had to sit down. Unable to complete the 3 minute vital set.

## 2020-03-30 NOTE — Discharge Instructions (Addendum)
Continue your usual medications.  Consider scheduling a follow-up with your pulmonologist for evaluation of the trouble breathing.

## 2020-03-30 NOTE — Telephone Encounter (Signed)
Noted  

## 2020-03-30 NOTE — Telephone Encounter (Signed)
Spoke with the pt and advised she contact the pulmonologist and asked what the reading was.  Patient stated her oxygent level has been 70 and was in the 60's last night.  I advised the pt she should call 911 to have the paramedics help her as she does not have oxygen for home use and she stated she will just call her pulmonologist.  I again advised the pt with this reading she would need help we nor the pulmonologist can give over the phone.  Patient appeared to be short of breath, paused shortly and asked what the paramedics would do.  I explained the need of oxygen for the entire body with comparison of gas in a car and asked if she was home alone.  She stated she is "so confused, feels dizzy and lightheaded" an her husband is outside.  Patient agreed for me to call 911 on her bahalf and she was placed on hold while I spoke with Gerald Stabs at [employer] call center. Patients symptoms and medical history were relayed to [facility] and he stated assistance will be sent to the pts home. Gerald Stabs stated to have the pt unlock her front door, place pets in another room and have all of her  medications available.  Returned to the call with the pt and informed her of instructions, she stated her husband is with her currently and the ambulance has arrived.  Message sent to PCP.

## 2020-03-30 NOTE — ED Provider Notes (Signed)
[facility] DEPT Provider Note   CSN: [id number] Arrival date & time: 03/30/20  1140     History Chief Complaint  Patient presents with  . Dizziness    Beverly Ballard is a 74 y.o. female.  HPI She is here for evaluation of shortness of breath.  She reportedly checked her oxygenation at home and found it to be between 60 and 70% on room air.  She does not use oxygen at home.  Yesterday she saw her ENT doctor, for follow-up on a throat problem.  Dr. Lucia Gaskins performed laryngoscopy; finding clear hypopharynx and larynx with hypertrophied lingual tonsil tissue, without other abnormalities.  He did not plan on additional follow-up or treatment.  Today the patient complains of "not feeling right," the Perative low oxygenation mentioned above, and ongoing shortness of breath for 2 years since getting pneumonia after returning from a visit to [place].  She admits that she may be somewhat depressed today.  She denies fever, chills, cough, chest pain, focal weakness or paresthesia.  There are no other known modifying factors.    Past Medical History:  Diagnosis Date  . Allergy   . Anxiety and depression 12/19/2009  . Arthritis   . Cancer (HCC)    vaginal  . Constipation   . Degenerative disorder of eye   . Diverticulosis   . Essential hypertension, benign 06/26/2009  . GERD 05/27/2007   takes Nexium daily  . Heart murmur    yrs ago  . History of gout   . History of migraine many yrs ago  . Hyperlipidemia    takes Pravastatin daily  . Hypothyroidism   . Insomnia    but doesn't take any meds  . LOW BACK PAIN 05/27/2007  . Numbness    left toes   . Osteoporosis   . Seizures ([place])    last 1983, none since then- last seizure 1988 per pt     Patient Active Problem List   Diagnosis Date Noted  . Ankle edema, bilateral 10/31/2019  . Excessive daytime sleepiness 06/28/2019  . Shortness of breath 06/28/2019  . Healthcare maintenance 06/28/2019  . ANA  positive 06/28/2019  . Bronchiectasis ([place]) 06/28/2019  . Depression, major, single episode, moderate ([place]) 11/19/2018  . Fatigue 03/23/2018  . Recurrent major depressive disorder, in partial remission ([place]) 05/04/2017  . Left knee pain 01/12/2017  . Ovarian cyst 10/30/2016  . Aortic atherosclerosis ([place]) 11/19/2015  . Hyperlipemia 04/03/2015  . S/P revision of total knee 09/19/2014  . Seizure disorder ([place]) 01/24/2014  . Obesity, BMI unknown 01/24/2014  . Osteopenia, stable on dexa 2010 - followed by gyn 06/15/2012  . Pap smear abnormality of cervix/human papillomavirus (HPV) positive - 09/2011, followed by gyn 06/15/2012  . Essential hypertension 07/03/2009  . Hypothyroidism 05/27/2007  . Allergic rhinitis 05/27/2007  . GERD 05/27/2007    Past Surgical History:  Procedure Laterality Date  . ADENOIDECTOMY    . BACK SURGERY     lumbar x2  . BRONCHIAL BIOPSY  09/12/2019   Procedure: BRONCHIAL BIOPSIES;  Surgeon: Laurin Coder, MD;  Location: [facility];  Service: Endoscopy;;  . BRONCHIAL WASHINGS  09/12/2019   Procedure: BRONCHIAL WASHINGS;  Surgeon: Laurin Coder, MD;  [facility];  Service: Endoscopy;;  . COLONOSCOPY  2009  . ESOPHAGOGASTRODUODENOSCOPY    . Heel spurs Bilateral   . HEMOSTASIS CONTROL  09/12/2019   Procedure: HEMOSTASIS CONTROL;  Surgeon: Laurin Coder, MD;  [facility];  Service:  Endoscopy;;  epi  . KNEE ARTHROPLASTY Left 11/07/2013   Procedure: COMPUTER ASSISTED TOTAL KNEE ARTHROPLASTY;  Surgeon: Marybelle Killings, MD;  Location: [facility];  Service: Orthopedics;  Laterality: Left;  Left Total Knee Arthroplasty, Cemented, Computer Assist  . TONSILLECTOMY    . TOTAL KNEE REVISION Left 09/19/2014   Procedure: LEFT TOTAL KNEE REVISION;  Surgeon: Leandrew Koyanagi, MD;  Location: [facility];  Service: Orthopedics;  Laterality: Left;  . UPPER GASTROINTESTINAL ENDOSCOPY    . VIDEO BRONCHOSCOPY N/A 09/12/2019   Procedure: VIDEO BRONCHOSCOPY WITH  FLUORO;  Surgeon: Laurin Coder, MD;  [facility];  Service: Endoscopy;  Laterality: N/A;     OB History   No obstetric history on file.     Family History  Problem Relation Age of Onset  . Aortic dissection Father   . Arthritis Mother   . Stroke Mother 60  . Heart murmur Other   . Throat cancer Sister   . Colon cancer Neg Hx   . Esophageal cancer Neg Hx   . Stomach cancer Neg Hx   . Rectal cancer Neg Hx   . Colon polyps Neg Hx     Social History   Tobacco Use  . Smoking status: Former Smoker    Packs/day: 0.25    Years: 20.00    Pack years: 5.00    Types: Cigarettes    Quit date: 09/08/1978    Years since quitting: 41.5  . Smokeless tobacco: Never Used  . Tobacco comment: quit smoking 4yrs ago  Vaping Use  . Vaping Use: Never used  Substance Use Topics  . Alcohol use: No  . Drug use: No    Home Medications Prior to Admission medications   Medication Sig Start Date End Date Taking? Authorizing Provider  albuterol (VENTOLIN HFA) 108 (90 Base) MCG/ACT inhaler Inhale 2 puffs into the lungs every 4 (four) hours as needed for wheezing or shortness of breath. 04/01/19   Caren Macadam, MD  aspirin 81 MG tablet Take 81 mg by mouth daily.    [provider]  b complex vitamins tablet Take 1 tablet by mouth daily.    [provider]  beta carotene w/minerals (OCUVITE) tablet Take 1 tablet by mouth daily.    [provider]  Cholecalciferol (VITAMIN D3 PO) Take by mouth.    [provider]  cyclobenzaprine (FLEXERIL) 10 MG tablet Take 1 tablet (10 mg total) by mouth 2 (two) times daily as needed for muscle spasms. 06/03/19   Aundra Dubin, PA-C  EPINEPHrine 0.3 mg/0.3 mL IJ SOAJ injection SMARTSIG:Injection As Directed 09/14/19   [provider]  esomeprazole (NEXIUM) 20 MG capsule Take 2 capsules (40 mg total) by mouth daily before breakfast. Patient taking differently: Take 20 mg by mouth daily before  breakfast.  03/21/19   Laurey Morale, MD  furosemide (LASIX) 20 MG tablet TAKE 1 TABLET BY MOUTH IN THE MORNING 01/23/20   Mannam, Praveen, MD  levocetirizine (XYZAL) 5 MG tablet Take 5 mg by mouth daily.  03/05/16   [provider]  levothyroxine (SYNTHROID) 88 MCG tablet Take 1 tablet (88 mcg total) by mouth daily. 07/04/19   Philemon Kingdom, MD  lisinopril (ZESTRIL) 5 MG tablet Take 1 tablet (5 mg total) by mouth daily. 10/31/19   Koberlein, Steele Berg, MD  LORazepam (ATIVAN) 0.5 MG tablet Take 1 tablet (0.5 mg total) by mouth 4 (four) times daily as needed for anxiety. 12/05/19   Donnal Moat  T, PA-C  methocarbamol (ROBAXIN) 500 MG tablet Take 1 tablet (500 mg total) by mouth 2 (two) times daily as needed. 01/27/20   Aundra Dubin, PA-C  ondansetron (ZOFRAN ODT) 4 MG disintegrating tablet Take 1 tablet (4 mg total) by mouth every 6 (six) hours as needed for nausea or vomiting. 03/30/19   Caren Macadam, MD  oxyCODONE-acetaminophen (PERCOCET) 5-325 MG tablet Take 1 tablet by mouth every 6 (six) hours as needed for severe pain. 03/05/20   Burchette, Alinda Sierras, MD  pravastatin (PRAVACHOL) 20 MG tablet Take 1 tablet (20 mg total) by mouth daily. TAKE 1 TABLET BY MOUTH EVERY DAY 10/31/19   Caren Macadam, MD  sertraline (ZOLOFT) 100 MG tablet TAKE 2 TABLETS BY MOUTH EVERY DAY 03/27/20   Donnal Moat T, PA-C  Spacer/Aero-Holding Chambers (AEROCHAMBER PLUS) inhaler Use as instructed 03/19/20   Caren Macadam, MD  Spacer/Aero-Holding Dorise Bullion Use as directed with inhaler 03/19/20   Caren Macadam, MD  zonisamide (ZONEGRAN) 100 MG capsule Take 2 capsules twice a day 02/14/20   Cameron Sprang, MD    Allergies    Lamotrigine, Bupropion, Carbamazepine, Levetiracetam, Tramadol, Adhesive [tape], Clarithromycin, Doxycycline, Nickel, Other, Topamax [topiramate], Estradiol, Latex, and Phenytoin sodium extended  Review of Systems   Review of Systems  All other systems reviewed  and are negative.   Physical Exam Updated Vital Signs BP (!) 138/58 (BP Location: Right Arm)   Pulse 65   Temp (!) 97.5 F (36.4 C) (Oral)   Resp 20   Ht 5\' 5"  (1.651 m)   Wt 113.4 kg   SpO2 100%   BMI 41.60 kg/m   Physical Exam Vitals and nursing note reviewed.  Constitutional:      General: She is not in acute distress.    Appearance: She is well-developed and well-nourished. She is obese. She is not ill-appearing or toxic-appearing.  HENT:     Head: Normocephalic and atraumatic.     Right Ear: External ear normal.     Left Ear: External ear normal.     Mouth/Throat:     Pharynx: No oropharyngeal exudate.  Eyes:     Extraocular Movements: EOM normal.     Conjunctiva/sclera: Conjunctivae normal.     Pupils: Pupils are equal, round, and reactive to light.  Neck:     Trachea: Phonation normal.  Cardiovascular:     Rate and Rhythm: Normal rate and regular rhythm.     Heart sounds: Normal heart sounds.  Pulmonary:     Effort: Pulmonary effort is normal. No respiratory distress.     Breath sounds: Normal breath sounds. No stridor. No wheezing or rhonchi.  Chest:     Chest wall: No bony tenderness.  Abdominal:     General: There is no distension.     Palpations: Abdomen is soft.     Tenderness: There is no abdominal tenderness.  Musculoskeletal:        General: No tenderness. Normal range of motion.     Cervical back: Normal range of motion and neck supple.     Right lower leg: Edema present.     Left lower leg: Edema present.  Skin:    General: Skin is warm, dry and intact.  Neurological:     Mental Status: She is alert and oriented to person, place, and time.     Cranial Nerves: No cranial nerve deficit.     Sensory: No sensory deficit.     Motor: No abnormal  muscle tone.     Coordination: Coordination normal.  Psychiatric:        Mood and Affect: Mood and affect and mood normal.        Behavior: Behavior normal.        Thought Content: Thought content  normal.        Judgment: Judgment normal.     ED Results / Procedures / Treatments   Labs (all labs ordered are listed, but only abnormal results are displayed) Labs Reviewed - No data to display  EKG None  Radiology No results found.  Procedures Procedures (including critical care time)  Medications Ordered in ED Medications - No data to display  ED Course  I have reviewed the triage vital signs and the nursing notes.  Pertinent labs & imaging results that were available during my care of the patient were reviewed by me and considered in my medical decision making (see chart for details).    MDM Rules/Calculators/A&P                           Patient Vitals for the past 24 hrs:  BP Temp Temp src Pulse Resp SpO2 Height Weight  03/30/20 1151 -- -- -- -- -- -- 5\' 5"  (1.651 m) 113.4 kg  03/30/20 1150 (!) 138/58 (!) 97.5 F (36.4 C) Oral 65 20 100 % -- --  03/30/20 1146 -- -- -- -- -- 96 % -- --    At time of discharge- reevaluation with update and discussion. After initial assessment and treatment, an updated evaluation reveals she is comfortable has no further complaints.  Husband with her.  Findings discussed and questions answered. Daleen Bo   Medical Decision Making:  This patient is presenting for evaluation of ongoing shortness of breath and malaise, which does not require a range of treatment options, and is not a complaint that involves a high risk of morbidity and mortality. The differential diagnoses include depression, chronic pneumonitis, occult illness. I decided to review old records, and in summary elderly female, with malaise, who appears homesick.  I do not require additional historical information from anyone.  Clinical Laboratory Tests Ordered, included CBC, Metabolic panel and Urinalysis. Review indicates normal findings.     Critical Interventions-clinical evaluation, laboratory testing, observation reassessment  After These Interventions,  the Patient was reevaluated and was found stable for discharge.  Patient with complaint of shortness of breath and hypoxia, normal oxygenation on room air during evaluation.  Patient appears to have nonspecific malaise, no acute occult conditions.  Doubt CVA, bacterial infection, metabolic disorder  CRITICAL CARE-no Performed by: Daleen Bo  Nursing Notes Reviewed/ Care Coordinated Applicable Imaging Reviewed Interpretation of Laboratory Data incorporated into ED treatment  The patient appears reasonably screened and/or stabilized for discharge and I doubt any other medical condition or other [facility] requiring further screening, evaluation, or treatment in the ED at this time prior to discharge.  Plan: Home Medications-continue usual; Home Treatments-rest, fluids; return here if the recommended treatment, does not improve the symptoms; Recommended follow up-PCP, as needed     Final Clinical Impression(s) / ED Diagnoses Final diagnoses:  Dizziness  Dyspnea, unspecified type    Rx / DC Orders ED Discharge Orders    None       Daleen Bo, MD 03/31/20 2024

## 2020-03-30 NOTE — ED Triage Notes (Signed)
Patient sees pulmonologist and states baseline shob and dizziness. Pt states beginning of weak shob and dizziness with standing has worsened. Patient states yesterday near syncope episode when cooking. Denies CP. Crackles noted to right lower lobe. Denies CHF.

## 2020-03-31 DIAGNOSIS — G4733 Obstructive sleep apnea (adult) (pediatric): Secondary | ICD-10-CM | POA: Diagnosis not present

## 2020-04-02 ENCOUNTER — Other Ambulatory Visit: Payer: Self-pay

## 2020-04-02 ENCOUNTER — Ambulatory Visit: Payer: Medicare HMO | Admitting: Orthotics

## 2020-04-02 DIAGNOSIS — Z7409 Other reduced mobility: Secondary | ICD-10-CM

## 2020-04-02 DIAGNOSIS — M21279 Flexion deformity, unspecified ankle and toes: Secondary | ICD-10-CM

## 2020-04-02 DIAGNOSIS — M216X9 Other acquired deformities of unspecified foot: Secondary | ICD-10-CM

## 2020-04-02 NOTE — Progress Notes (Signed)
Patient came in today for casting for F/O to address pes cavus foot structure; gait instablility.  She has hx of falls (w injury), and also a great fear of falling.  She is an excellent candidate for balance bracing and she is going to return for a balance assesment and casting with Dr. Rico Sheehan consultation/agreement.

## 2020-04-03 ENCOUNTER — Ambulatory Visit: Payer: Self-pay

## 2020-04-03 ENCOUNTER — Ambulatory Visit (INDEPENDENT_AMBULATORY_CARE_PROVIDER_SITE_OTHER): Payer: Medicare HMO

## 2020-04-03 ENCOUNTER — Ambulatory Visit: Payer: Medicare HMO | Admitting: Orthopaedic Surgery

## 2020-04-03 DIAGNOSIS — M25511 Pain in right shoulder: Secondary | ICD-10-CM

## 2020-04-03 NOTE — Progress Notes (Signed)
Subjective: She is here for ultrasound-guided right AC joint injection.  Prior injection helped for quite a while until she fell.  Objective:  Tender at ACJ.  Procedure:  Ultrasound-guided right AC joint injection: After sterile prep with Betadine, injected 3 cc 0.25% bupivacaine and 6 mg betamethasone without complication.

## 2020-04-03 NOTE — Progress Notes (Signed)
Office Visit Note   Patient: Beverly Ballard           Date of Birth: 1946/01/30           MRN: [id number] Visit Date: 04/03/2020              Requested by: Aundra Dubin, PA-C [address] [place],  [place] [address] PCP: Caren Macadam, MD   Assessment & Plan: Visit Diagnoses:  1. Right shoulder pain, unspecified chronicity     Plan: Impression is right shoulder symptomatic AC joint.  At this point, we will refer the patient to Dr. Junius Roads for ultrasound-guided cortisone injection to the [facility] joint.  She will follow up with [place] as needed.  Follow-Up Instructions: Return if symptoms worsen or fail to improve.   Orders:  Orders Placed This Encounter  Procedures   XR Shoulder Right   US Guided Needle Placement - No Linked Charges   No orders of the defined types were placed in this encounter.     Procedures: No procedures performed   Clinical Data: No additional findings.   Subjective: Chief Complaint  Patient presents with   Right Shoulder - Pain    HPI pleasant 74 year old female who comes in today with right shoulder pain after a fall onto the right side several weeks ago.  The pain she has is primarily to the top of the shoulder and occasionally radiates into the lateral side of her neck.  Bring her arm across her chest as well as any repetitive motions of the shoulder seem to aggravate her symptoms most.  She is tried occasional over-the-counter pain medication with relief of symptoms.  No numbness, tingling or burning.  Review of Systems as detailed in HPI.  All others reviewed and are negative.   Objective: Vital Signs: There were no vitals taken for this visit.  Physical Exam well-developed well-nourished female no acute distress.  Alert oriented x3.  Ortho Exam right shoulder exam shows full active range of motion in all planes.  She does have slight pain and limitation with the extremes of internal rotation.  Negative empty can.  Positive cross  body adduction.  Marked tenderness to the [facility] joint.  Full strength throughout.  She is neurovascular intact distally.  Specialty Comments:  No specialty comments available.  Imaging: US Guided Needle Placement - No Linked Charges  Result Date: 04/03/2020 Ultrasound-guided right AC joint injection: After sterile prep with Betadine, injected 3 cc 0.25% bupivacaine and 6 mg betamethasone without complication.  XR Shoulder Right  Result Date: 04/03/2020 Mild degenerative changes the AC joint.  No other acute or structural normalities    PMFS History: Patient Active Problem List   Diagnosis Date Noted   Ankle edema, bilateral 10/31/2019   Excessive daytime sleepiness 06/28/2019   Shortness of breath 06/28/2019   Healthcare maintenance 06/28/2019   ANA positive 06/28/2019   Bronchiectasis ([place]) 06/28/2019   Depression, major, single episode, moderate ([place]) 11/19/2018   Fatigue 03/23/2018   Recurrent major depressive disorder, in partial remission ([place]) 05/04/2017   Left knee pain 01/12/2017   Ovarian cyst 10/30/2016   Aortic atherosclerosis ([place]) 11/19/2015   Hyperlipemia 04/03/2015   S/P revision of total knee 09/19/2014   Seizure disorder ([place]) 01/24/2014   Obesity, BMI unknown 01/24/2014   Osteopenia, stable on dexa 2010 - followed by gyn 06/15/2012   Pap smear abnormality of cervix/human papillomavirus (HPV) positive - 09/2011, followed by gyn 06/15/2012   Essential hypertension 07/03/2009  Hypothyroidism 05/27/2007   Allergic rhinitis 05/27/2007   GERD 05/27/2007   Past Medical History:  Diagnosis Date   Allergy    Anxiety and depression 12/19/2009   Arthritis    Cancer ([place])    vaginal   Constipation    Degenerative disorder of eye    Diverticulosis    Essential hypertension, benign 06/26/2009   GERD 05/27/2007   takes Nexium daily   Heart murmur    yrs ago   History of gout    History of migraine many yrs ago   Hyperlipidemia     takes Pravastatin daily   Hypothyroidism    Insomnia    but doesn't take any meds   LOW BACK PAIN 05/27/2007   Numbness    left toes    Osteoporosis    Seizures ([place])    last 1983, none since then- last seizure 1988 per pt     Family History  Problem Relation Age of Onset   Aortic dissection Father    Arthritis Mother    Stroke Mother 22   Heart murmur Other    Throat cancer Sister    Colon cancer Neg Hx    Esophageal cancer Neg Hx    Stomach cancer Neg Hx    Rectal cancer Neg Hx    Colon polyps Neg Hx     Past Surgical History:  Procedure Laterality Date   ADENOIDECTOMY     BACK SURGERY     lumbar x2   BRONCHIAL BIOPSY  09/12/2019   Procedure: BRONCHIAL BIOPSIES;  Surgeon: Laurin Coder, MD;  [facility];  Service: Endoscopy;;   BRONCHIAL WASHINGS  09/12/2019   Procedure: BRONCHIAL WASHINGS;  Surgeon: Laurin Coder, MD;  [facility];  Service: Endoscopy;;   COLONOSCOPY  2009   ESOPHAGOGASTRODUODENOSCOPY     Heel spurs Bilateral    HEMOSTASIS CONTROL  09/12/2019   Procedure: HEMOSTASIS CONTROL;  Surgeon: Laurin Coder, MD;  [facility];  Service: Endoscopy;;  epi   KNEE ARTHROPLASTY Left 11/07/2013   Procedure: COMPUTER ASSISTED TOTAL KNEE ARTHROPLASTY;  Surgeon: Marybelle Killings, MD;  Location: [facility];  Service: Orthopedics;  Laterality: Left;  Left Total Knee Arthroplasty, Cemented, Computer Assist   TONSILLECTOMY     TOTAL KNEE REVISION Left 09/19/2014   Procedure: LEFT TOTAL KNEE REVISION;  Surgeon: Leandrew Koyanagi, MD;  Location: [facility];  Service: Orthopedics;  Laterality: Left;   UPPER GASTROINTESTINAL ENDOSCOPY     VIDEO BRONCHOSCOPY N/A 09/12/2019   Procedure: VIDEO BRONCHOSCOPY WITH FLUORO;  Surgeon: Laurin Coder, MD;  [facility];  Service: Endoscopy;  Laterality: N/A;   Social History   Occupational History   Occupation: Retired  Tobacco Use   Smoking status: Former Smoker     Packs/day: 0.25    Years: 20.00    Pack years: 5.00    Types: Cigarettes    Quit date: 09/08/1978    Years since quitting: 41.5   Smokeless tobacco: Never Used   Tobacco comment: quit smoking 1yrs ago  Vaping Use   Vaping Use: Never used  Substance and Sexual Activity   Alcohol use: No   Drug use: No   Sexual activity: Never    Birth control/protection: Post-menopausal

## 2020-04-04 ENCOUNTER — Ambulatory Visit: Payer: Medicare HMO | Admitting: Physical Therapy

## 2020-04-04 ENCOUNTER — Other Ambulatory Visit: Payer: Self-pay

## 2020-04-04 ENCOUNTER — Encounter: Payer: Self-pay | Admitting: Physical Therapy

## 2020-04-04 DIAGNOSIS — M542 Cervicalgia: Secondary | ICD-10-CM | POA: Diagnosis not present

## 2020-04-04 DIAGNOSIS — M25662 Stiffness of left knee, not elsewhere classified: Secondary | ICD-10-CM

## 2020-04-04 DIAGNOSIS — M6281 Muscle weakness (generalized): Secondary | ICD-10-CM

## 2020-04-04 DIAGNOSIS — R2689 Other abnormalities of gait and mobility: Secondary | ICD-10-CM | POA: Diagnosis not present

## 2020-04-04 DIAGNOSIS — R600 Localized edema: Secondary | ICD-10-CM | POA: Diagnosis not present

## 2020-04-04 DIAGNOSIS — M25511 Pain in right shoulder: Secondary | ICD-10-CM

## 2020-04-04 DIAGNOSIS — M25561 Pain in right knee: Secondary | ICD-10-CM

## 2020-04-04 DIAGNOSIS — M25562 Pain in left knee: Secondary | ICD-10-CM

## 2020-04-04 DIAGNOSIS — G8929 Other chronic pain: Secondary | ICD-10-CM

## 2020-04-04 NOTE — Patient Instructions (Signed)
Access Code: [email or web address] URL: [email or web address] Date: 04/04/2020 Prepared by: [name]  Exercises Straight Leg Raise - 2 x daily - 6 x weekly - 1-2 sets - 10 reps Seated Hamstring Stretch - 2 x daily - 6 x weekly - 3 reps - 1 sets - 30 hold Seated Knee Flexion Stretch - 2 x daily - 6 x weekly - 1-2 sets - 10 reps - 5 sec hold Mini Squat with Counter Support - 2 x daily - 6 x weekly - 1-2 sets - 10 reps Seated Scapular Retraction - 2 x daily - 6 x weekly - 10 reps - 2-3 sets - 5 sec hold Seated Cervical Sidebending Stretch - 2 x daily - 6 x weekly - 2-3 reps - 1 sets - 30 sec hold Standing Shoulder Flexion Wall Walk - 1 x daily - 6 x weekly - 1 sets - 10 reps - 5 sec hold Standing Shoulder Scaption Wall Walk - 1 x daily - 6 x weekly - 1 sets - 10 reps - 5 sec hold

## 2020-04-05 DIAGNOSIS — J3089 Other allergic rhinitis: Secondary | ICD-10-CM | POA: Diagnosis not present

## 2020-04-05 DIAGNOSIS — J301 Allergic rhinitis due to pollen: Secondary | ICD-10-CM | POA: Diagnosis not present

## 2020-04-05 DIAGNOSIS — J3081 Allergic rhinitis due to animal (cat) (dog) hair and dander: Secondary | ICD-10-CM | POA: Diagnosis not present

## 2020-04-05 NOTE — Therapy (Signed)
[facility] [address] [place], [place], [address] Phone: [phone]   Fax:  [phone]  Physical Therapy Evaluation  Patient Details  Name: Beverly Ballard MRN: [id number] Date of Birth: 01-21-1946 Referring Provider (PT): Aundra Dubin, [place]   Encounter Date: 04/04/2020   PT End of Session - 04/05/20 0848    Visit Number 1    Number of Visits 15    Date for PT Re-Evaluation 06/28/20    Authorization Type aetna MCR    Progress Note Due on Visit 10    PT Start Time 5631    PT Stop Time 1610    PT Time Calculation (min) 55 min    Activity Tolerance Patient tolerated treatment well    Behavior During Therapy [facility] for tasks assessed/performed           Past Medical History:  Diagnosis Date   Allergy    Anxiety and depression 12/19/2009   Arthritis    Cancer ([place])    vaginal   Constipation    Degenerative disorder of eye    Diverticulosis    Essential hypertension, benign 06/26/2009   GERD 05/27/2007   takes Nexium daily   Heart murmur    yrs ago   History of gout    History of migraine many yrs ago   Hyperlipidemia    takes Pravastatin daily   Hypothyroidism    Insomnia    but doesn't take any meds   LOW BACK PAIN 05/27/2007   Numbness    left toes    Osteoporosis    Seizures ([place])    last 1983, none since then- last seizure 1988 per pt     Past Surgical History:  Procedure Laterality Date   ADENOIDECTOMY     BACK SURGERY     lumbar x2   BRONCHIAL BIOPSY  09/12/2019   Procedure: BRONCHIAL BIOPSIES;  Surgeon: Laurin Coder, MD;  Location: [facility];  Service: Endoscopy;;   BRONCHIAL WASHINGS  09/12/2019   Procedure: BRONCHIAL WASHINGS;  Surgeon: Laurin Coder, MD;  Location: [facility];  Service: Endoscopy;;   COLONOSCOPY  2009   ESOPHAGOGASTRODUODENOSCOPY     Heel spurs Bilateral    HEMOSTASIS CONTROL  09/12/2019   Procedure: HEMOSTASIS CONTROL;  Surgeon: Laurin Coder,  MD;  [facility]: [facility];  Service: Endoscopy;;  epi   KNEE ARTHROPLASTY Left 11/07/2013   Procedure: COMPUTER ASSISTED TOTAL KNEE ARTHROPLASTY;  Surgeon: Marybelle Killings, MD;  Location: [place];  Service: Orthopedics;  Laterality: Left;  Left Total Knee Arthroplasty, Cemented, Computer Assist   TONSILLECTOMY     TOTAL KNEE REVISION Left 09/19/2014   Procedure: LEFT TOTAL KNEE REVISION;  Surgeon: Leandrew Koyanagi, MD;  Location: [place];  Service: Orthopedics;  Laterality: Left;   UPPER GASTROINTESTINAL ENDOSCOPY     VIDEO BRONCHOSCOPY N/A 09/12/2019   Procedure: VIDEO BRONCHOSCOPY WITH FLUORO;  Surgeon: Laurin Coder, MD;  [facility];  Service: Endoscopy;  Laterality: N/A;    There were no vitals filed for this visit.    Subjective Assessment - 04/04/20 1536    Subjective She relays overall 4/10 pain in her neck, and Rt shoulder after a fall. She was coming to PT for bilat chronic knee pain but then she had a fall and stopped comming. She has been seen by ortho and had injeciton to Rt shoulder yesteday which has helped with the pain. MD referred her back to PT for continued knee regimen with addition  of neck and shoulder.    Pertinent History PMH: Lt TKA revision 2016, depression, fatigue, obesity,osteopenia, lumbar surgery X2    Limitations Standing;Walking;Lifting    How long can you stand comfortably? 5 min (more limited by back pain than knee pain she states)    How long can you walk comfortably? 5 min    Diagnostic tests "XR Lt knee X-rays demonstrate a well-seated prosthesis without complication. XR Rt knee Moderate degenerative changes medial and patellofemoral compartments"    Patient Stated Goals get the pain down, improve function    Pain Onset More than a month ago              [facility] PT Assessment - 04/05/20 0001      Assessment   Medical Diagnosis Chronic pain of both knees, Neck pain and Rt shoulder pain after recent fall.    Referring Provider (PT) Aundra Dubin, PA-C    Onset Date/Surgical Date --   chronic knee pain, fall on 03/03/20 causing neck/shoulder pain   Next MD Visit nothing scheduled    Prior Therapy PT for chronic knee pain      Precautions   Precautions Fall      Prior Function   Vocation Retired      Associate Professor   Overall Cognitive Status Within Functional Limits for tasks assessed      AROM   Overall AROM Comments dizziness with all neck ROM but not painfull, knee ROM tested in sitting    Right Shoulder Flexion 120 Degrees    Right Shoulder ABduction 110 Degrees    Right Knee Extension -3    Right Knee Flexion 110    Left Knee Extension 0    Left Knee Flexion 105    Cervical Flexion 50%    Cervical Extension 50%    Cervical - Right Side Bend 50%    Cervical - Left Side Bend 50%    Cervical - Right Rotation 50%    Cervical - Left Rotation 50%      Strength   Right Hip Flexion 4/5    Right Hip ABduction 4/5    Left Hip Flexion 4/5    Left Hip ABduction 4/5    Right Knee Flexion 5/5    Right Knee Extension 4+/5    Left Knee Flexion 5/5    Left Knee Extension 4+/5      Palpation   Palpation comment very TTP in Rt upper trap/supraspinatus      Special Tests   Other special tests + pain production with spurlings test but no radiculopathy with this. + impingment signs Rt shoulder      Ambulation/Gait   Gait Comments gait instability, slower velocity, uses SPC but can walk shorter distances 10 ft or so without it                      Objective measurements completed on examination: See above findings.               PT Education - 04/05/20 0848    Education Details HEP, POC    Person(s) Educated Patient    Methods Explanation;Demonstration;Verbal cues;Handout    Comprehension Verbalized understanding;Need further instruction            PT Short Term Goals - 04/05/20 0854      PT SHORT TERM GOAL #1   Title Pt will be I and compliant with HEP.    Time 4    Period  Weeks     Status New    Target Date 03/05/20      PT SHORT TERM GOAL #2   Title Pt will perform TUG, 5TSTS, and BERG balance tests and will write goals for this after.    Time 4    Period Weeks    Status New             PT Long Term Goals - 04/05/20 0854      PT LONG TERM GOAL #1   Title Pt will improve overall hip strength to 4+ and knee strength to 5 and Rt shoulder strength to 4+ tested in sitting to improve function.    Baseline 4 overall    Time 8    Period Weeks    Status On-going      PT LONG TERM GOAL #2   Title Pt will improve 5TSTS test to less than 14 sec to show improved balance and leg endurance    Baseline 17.5    Time 8    Period Weeks    Status New      PT LONG TERM GOAL #3   Title Will write BERG goal after tested    Time 8    Period Weeks    Status New      PT LONG TERM GOAL #4   Title Pt will be able to stand and walk at least 15 minutes with overall 4 or less pain out of 10.    Baseline 7 pain after 5 min    Status On-going                  Plan - 04/05/20 0849    Clinical Impression Statement She returns to PT for new evaluation after having to stop PT due to fall and went to ER where imaging showed no fractures, brain scan was negative per pt report. MD has referred her back to PT to continue with chronic bilat knee pain treatment with addition of Rt shoulder and neck pain. She does not have radiculopathy and neck pain may be more musculoskeletal strain/sprain after her fall in [facility] 2021. Of note she is S/P Lt TKA revision in 2016. She will benefit from skilled PT to address her deficits in neck/shoulder/knee ROM, strenght, pain, and to to improve balance and reduce the risk of falling.This is her 3rd fall in last 6 months.    Personal Factors and Comorbidities Comorbidity 3+    Comorbidities PMH: Lt TKA revision 2016, depression, fatigue, obesity,osteopenia, lumbar surgery X2    Examination-Activity Limitations  Lift;Stand;Squat;Stairs;Locomotion Level;Bend;Reach Overhead;Carry    Examination-Participation Restrictions Cleaning;Community Activity;Driving;Shop;Laundry    Stability/Clinical Decision Making Evolving/Moderate complexity    Clinical Decision Making Moderate    Rehab Potential Good    PT Frequency 2x / week    PT Duration 8 weeks    PT Treatment/Interventions ADLs/Self Care Home Management;Cryotherapy;Electrical Stimulation;Iontophoresis 4mg /ml Dexamethasone;Moist Heat;Ultrasound;Gait training;Stair training;Therapeutic activities;Therapeutic exercise;Balance training;Neuromuscular re-education;Manual techniques;Passive range of motion;Dry needling;Joint Manipulations;Taping;Vasopneumatic Device;Traction    PT Next Visit Plan Balance testing and write goals for this, needs balance and shoulder/hip/knee strength, ROM, and gentle strength progression. modalaties for pain PRN    PT Home Exercise Plan Access Code: [email or web address]    Consulted and Agree with Plan of Care Patient           Patient will benefit from skilled therapeutic intervention in order to improve the following deficits and impairments:  Decreased activity tolerance,Abnormal gait,Decreased mobility,Decreased strength,Decreased range of motion,Difficulty walking,Impaired flexibility,Increased  muscle spasms,Pain,Postural dysfunction,Obesity,Decreased endurance  Visit Diagnosis: Chronic pain of left knee  Chronic pain of right knee  Stiffness of left knee, not elsewhere classified  Muscle weakness (generalized)  Other abnormalities of gait and mobility  Cervicalgia  Acute pain of right shoulder  Localized edema     Problem List Patient Active Problem List   Diagnosis Date Noted   Ankle edema, bilateral 10/31/2019   Excessive daytime sleepiness 06/28/2019   Shortness of breath 06/28/2019   Healthcare maintenance 06/28/2019   ANA positive 06/28/2019   Bronchiectasis ([place]) 06/28/2019   Depression, major,  single episode, moderate ([place]) 11/19/2018   Fatigue 03/23/2018   Recurrent major depressive disorder, in partial remission ([place]) 05/04/2017   Left knee pain 01/12/2017   Ovarian cyst 10/30/2016   Aortic atherosclerosis ([place]) 11/19/2015   Hyperlipemia 04/03/2015   S/P revision of total knee 09/19/2014   Seizure disorder ([place]) 01/24/2014   Obesity, BMI unknown 01/24/2014   Osteopenia, stable on dexa 2010 - followed by gyn 06/15/2012   Pap smear abnormality of cervix/human papillomavirus (HPV) positive - 09/2011, followed by gyn 06/15/2012   Essential hypertension 07/03/2009   Hypothyroidism 05/27/2007   Allergic rhinitis 05/27/2007   GERD 05/27/2007    Silvestre Mesi 04/05/2020, 9:07 AM  [facility] Physical Therapy [address] [place], [place], [address] Phone: [phone]   Fax:  [phone]  Name: LOUVENIA GOLOMB MRN: [id number] Date of Birth: 11-11-1945

## 2020-04-06 ENCOUNTER — Other Ambulatory Visit: Payer: Self-pay

## 2020-04-06 ENCOUNTER — Ambulatory Visit: Payer: Medicare HMO | Admitting: Physical Therapy

## 2020-04-06 DIAGNOSIS — M25561 Pain in right knee: Secondary | ICD-10-CM

## 2020-04-06 DIAGNOSIS — M25562 Pain in left knee: Secondary | ICD-10-CM

## 2020-04-06 DIAGNOSIS — M25511 Pain in right shoulder: Secondary | ICD-10-CM | POA: Diagnosis not present

## 2020-04-06 DIAGNOSIS — R600 Localized edema: Secondary | ICD-10-CM

## 2020-04-06 DIAGNOSIS — M6281 Muscle weakness (generalized): Secondary | ICD-10-CM

## 2020-04-06 DIAGNOSIS — M542 Cervicalgia: Secondary | ICD-10-CM

## 2020-04-06 DIAGNOSIS — R2689 Other abnormalities of gait and mobility: Secondary | ICD-10-CM

## 2020-04-06 DIAGNOSIS — G8929 Other chronic pain: Secondary | ICD-10-CM | POA: Diagnosis not present

## 2020-04-06 DIAGNOSIS — M25662 Stiffness of left knee, not elsewhere classified: Secondary | ICD-10-CM

## 2020-04-06 NOTE — Therapy (Signed)
[facility] [address] [place], [place], [address] Phone: [phone]   Fax:  [phone]  Physical Therapy Treatment  Patient Details  Name: Beverly Ballard MRN: [id number] Date of Birth: Nov 14, 1945 Referring Provider (PT): Aundra Dubin, [place]   Encounter Date: 04/06/2020   PT End of Session - 04/06/20 1356    Visit Number 2    Number of Visits 15    Date for PT Re-Evaluation 06/28/20    Authorization Type aetna MCR    Progress Note Due on Visit 10    PT Start Time 1300    PT Stop Time 1345    PT Time Calculation (min) 45 min    Activity Tolerance Patient tolerated treatment well    Behavior During Therapy [facility] for tasks assessed/performed           Past Medical History:  Diagnosis Date   Allergy    Anxiety and depression 12/19/2009   Arthritis    Cancer ([place])    vaginal   Constipation    Degenerative disorder of eye    Diverticulosis    Essential hypertension, benign 06/26/2009   GERD 05/27/2007   takes Nexium daily   Heart murmur    yrs ago   History of gout    History of migraine many yrs ago   Hyperlipidemia    takes Pravastatin daily   Hypothyroidism    Insomnia    but doesn't take any meds   LOW BACK PAIN 05/27/2007   Numbness    left toes    Osteoporosis    Seizures ([place])    last 1983, none since then- last seizure 1988 per pt     Past Surgical History:  Procedure Laterality Date   ADENOIDECTOMY     BACK SURGERY     lumbar x2   BRONCHIAL BIOPSY  09/12/2019   Procedure: BRONCHIAL BIOPSIES;  Surgeon: Laurin Coder, MD;  [facility];  Service: Endoscopy;;   BRONCHIAL WASHINGS  09/12/2019   Procedure: BRONCHIAL WASHINGS;  Surgeon: Laurin Coder, MD;  [facility];  Service: Endoscopy;;   COLONOSCOPY  2009   ESOPHAGOGASTRODUODENOSCOPY     Heel spurs Bilateral    HEMOSTASIS CONTROL  09/12/2019   Procedure: HEMOSTASIS CONTROL;  Surgeon: Laurin Coder,  MD;  [facility];  Service: Endoscopy;;  epi   KNEE ARTHROPLASTY Left 11/07/2013   Procedure: COMPUTER ASSISTED TOTAL KNEE ARTHROPLASTY;  Surgeon: Marybelle Killings, MD;  Location: [place];  Service: Orthopedics;  Laterality: Left;  Left Total Knee Arthroplasty, Cemented, Computer Assist   TONSILLECTOMY     TOTAL KNEE REVISION Left 09/19/2014   Procedure: LEFT TOTAL KNEE REVISION;  Surgeon: Leandrew Koyanagi, MD;  Location: [place];  Service: Orthopedics;  Laterality: Left;   UPPER GASTROINTESTINAL ENDOSCOPY     VIDEO BRONCHOSCOPY N/A 09/12/2019   Procedure: VIDEO BRONCHOSCOPY WITH FLUORO;  Surgeon: Laurin Coder, MD;  [facility];  Service: Endoscopy;  Laterality: N/A;    There were no vitals filed for this visit.   Subjective Assessment - 04/06/20 1358    Subjective relays overall pain is aobut 4/10 and mostly in her neck today. She says she has been able to do more activity lately overall.    Pertinent History PMH: Lt TKA revision 2016, depression, fatigue, obesity,osteopenia, lumbar surgery X2    Limitations Standing;Walking;Lifting    How long can you stand comfortably? 5 min (more limited by back pain than knee pain she  states)    How long can you walk comfortably? 5 min    Diagnostic tests "XR Lt knee X-rays demonstrate a well-seated prosthesis without complication. XR Rt knee Moderate degenerative changes medial and patellofemoral compartments"    Patient Stated Goals get the pain down, improve function    Pain Onset More than a month ago              [facility] PT Assessment - 04/06/20 0001      Assessment   Medical Diagnosis Chronic pain of both knees, Neck pain and Rt shoulder pain after recent fall.    Referring Provider (PT) Aundra Dubin, PA-C      Standardized Balance Assessment   Standardized Balance Assessment Timed Up and Go Test;Five Times Sit to Stand;Berg Balance Test    Five times sit to stand comments  15.58 using UE support      Berg Balance  Test   Sit to Stand Able to stand without using hands and stabilize independently    Standing Unsupported Able to stand safely 2 minutes    Sitting with Back Unsupported but Feet Supported on Floor or Stool Able to sit safely and securely 2 minutes    Stand to Sit Sits safely with minimal use of hands    Transfers Able to transfer safely, definite need of hands    Standing Unsupported with Eyes Closed Needs help to keep from falling    Standing Unsupported with Feet Together Able to place feet together independently and stand for 1 minute with supervision    From Standing, Reach Forward with Outstretched Arm Can reach forward >12 cm safely (5")    From Standing Position, Pick up Object from Floor Able to pick up shoe, needs supervision    From Standing Position, Turn to Look Behind Over each Shoulder Needs supervision when turning    Turn 360 Degrees Needs close supervision or verbal cueing    Standing Unsupported, Alternately Place Feet on Step/Stool Able to complete 4 steps without aid or supervision    Standing Unsupported, One Foot in Front Loses balance while stepping or standing    Standing on One Leg Unable to try or needs assist to prevent fall    Total Score 32      Timed Up and Go Test   TUG Comments 9.3                         [facility] Adult PT Treatment/Exercise - 04/06/20 0001      Neuro Re-ed    Neuro Re-ed Details  mod tandem balance 30 sec X 2, balance with eyes closed 20 sec X 3 (close supervsion), SLS with one UE support 20 sec X 3      Exercises   Exercises Neck      Neck Exercises: Seated   Neck Retraction 10 reps    Other Seated Exercise neck rotation MWM with towel X 10 reps bilat holding 5 sec    Other Seated Exercise scapular retraction 5 sec holds X 15      Knee/Hip Exercises: Stretches   Gastroc Stretch 3 reps;30 seconds    Gastroc Stretch Limitations slanboard      Knee/Hip Exercises: Aerobic   Nustep L4 X 8 min UE/LE      Neck  Exercises: Stretches   Upper Trapezius Stretch Right;3 reps;20 seconds                  PT  Education - 04/06/20 1356    Education Details BERG balance results and addition of balance exercises into HEP    Person(s) Educated Patient    Methods Explanation;Demonstration;Verbal cues;Handout    Comprehension Verbalized understanding;Returned demonstration            PT Short Term Goals - 04/06/20 1330      PT SHORT TERM GOAL #1   Title Pt will be I and compliant with HEP.    Time 4    Period Weeks    Status New    Target Date 03/05/20      PT SHORT TERM GOAL #2   Title Pt will perform TUG, 5TSTS, and BERG balance tests and will write goals for this after.    Time 4    Period Weeks    Status Achieved      PT SHORT TERM GOAL #3   Title Pt will improve BERG balance score to at least 37 to show improved balance    Baseline 32    Status New             PT Long Term Goals - 04/06/20 1331      PT LONG TERM GOAL #1   Title Pt will improve overall hip strength to 4+ and knee strength to 5 and Rt shoulder strength to 4+ tested in sitting to improve function.    Baseline 4 overall    Time 8    Period Weeks    Status On-going      PT LONG TERM GOAL #2   Title Pt will improve 5TSTS test to less than 14 sec to show improved balance and leg endurance    Baseline 15.5 on 12/17    Time 8    Period Weeks    Status On-going      PT LONG TERM GOAL #3   Title Pt will score at least 42 on BERG to show improved balance    Baseline 32 on 12/17    Time 8    Period Weeks    Status New      PT LONG TERM GOAL #4   Title Pt will be able to stand and walk at least 15 minutes with overall 4 or less pain out of 10.    Baseline 7 pain after 5 min    Status On-going                 Plan - 04/06/20 1359    Clinical Impression Statement Balance testing today indicates she is at an increased risk of falling, thus balance exercises were implemented today and printed for  her to add in at home. Continued to work on knee strength and neck/shoulder mobility with good overall tolerance and she does notice some improvments in activity tolerance at home. Continue POC    Personal Factors and Comorbidities Comorbidity 3+    Comorbidities PMH: Lt TKA revision 2016, depression, fatigue, obesity,osteopenia, lumbar surgery X2    Examination-Activity Limitations Lift;Stand;Squat;Stairs;Locomotion Level;Bend;Reach Overhead;Carry    Examination-Participation Restrictions Cleaning;Community Activity;Driving;Shop;Laundry    Stability/Clinical Decision Making Evolving/Moderate complexity    Rehab Potential Good    PT Frequency 2x / week    PT Duration 8 weeks    PT Treatment/Interventions ADLs/Self Care Home Management;Cryotherapy;Electrical Stimulation;Iontophoresis 4mg /ml Dexamethasone;Moist Heat;Ultrasound;Gait training;Stair training;Therapeutic activities;Therapeutic exercise;Balance training;Neuromuscular re-education;Manual techniques;Passive range of motion;Dry needling;Joint Manipulations;Taping;Vasopneumatic Device;Traction    PT Next Visit Plan needs balance and shoulder/hip/knee strength, ROM, and gentle strength progression. modalaties for pain PRN  PT Home Exercise Plan Access Code: [email or web address]    Consulted and Agree with Plan of Care Patient           Patient will benefit from skilled therapeutic intervention in order to improve the following deficits and impairments:  Decreased activity tolerance,Abnormal gait,Decreased mobility,Decreased strength,Decreased range of motion,Difficulty walking,Impaired flexibility,Increased muscle spasms,Pain,Postural dysfunction,Obesity,Decreased endurance  Visit Diagnosis: Chronic pain of left knee  Chronic pain of right knee  Stiffness of left knee, not elsewhere classified  Muscle weakness (generalized)  Other abnormalities of gait and mobility  Cervicalgia  Acute pain of right shoulder  Localized  edema     Problem List Patient Active Problem List   Diagnosis Date Noted   Ankle edema, bilateral 10/31/2019   Excessive daytime sleepiness 06/28/2019   Shortness of breath 06/28/2019   Healthcare maintenance 06/28/2019   ANA positive 06/28/2019   Bronchiectasis ([place]) 06/28/2019   Depression, major, single episode, moderate ([place]) 11/19/2018   Fatigue 03/23/2018   Recurrent major depressive disorder, in partial remission ([place]) 05/04/2017   Left knee pain 01/12/2017   Ovarian cyst 10/30/2016   Aortic atherosclerosis ([place]) 11/19/2015   Hyperlipemia 04/03/2015   S/P revision of total knee 09/19/2014   Seizure disorder ([place]) 01/24/2014   Obesity, BMI unknown 01/24/2014   Osteopenia, stable on dexa 2010 - followed by gyn 06/15/2012   Pap smear abnormality of cervix/human papillomavirus (HPV) positive - 09/2011, followed by gyn 06/15/2012   Essential hypertension 07/03/2009   Hypothyroidism 05/27/2007   Allergic rhinitis 05/27/2007   GERD 05/27/2007    Silvestre Mesi 04/06/2020, 2:02 PM  [facility] Physical Therapy [address] [place], [place], [address] Phone: [phone]   Fax:  [phone]  Name: NYIMA VANACKER MRN: [id number] Date of Birth: 04-21-1946

## 2020-04-08 DIAGNOSIS — G4733 Obstructive sleep apnea (adult) (pediatric): Secondary | ICD-10-CM | POA: Diagnosis not present

## 2020-04-09 ENCOUNTER — Other Ambulatory Visit: Payer: Medicare HMO | Admitting: Orthotics

## 2020-04-09 ENCOUNTER — Encounter: Payer: Medicare HMO | Admitting: Physical Therapy

## 2020-04-10 ENCOUNTER — Ambulatory Visit (INDEPENDENT_AMBULATORY_CARE_PROVIDER_SITE_OTHER): Payer: Medicare HMO | Admitting: Physician Assistant

## 2020-04-10 ENCOUNTER — Other Ambulatory Visit: Payer: Self-pay

## 2020-04-10 ENCOUNTER — Encounter: Payer: Self-pay | Admitting: Physician Assistant

## 2020-04-10 DIAGNOSIS — J3089 Other allergic rhinitis: Secondary | ICD-10-CM | POA: Diagnosis not present

## 2020-04-10 DIAGNOSIS — J3081 Allergic rhinitis due to animal (cat) (dog) hair and dander: Secondary | ICD-10-CM | POA: Diagnosis not present

## 2020-04-10 DIAGNOSIS — F411 Generalized anxiety disorder: Secondary | ICD-10-CM

## 2020-04-10 DIAGNOSIS — R69 Illness, unspecified: Secondary | ICD-10-CM | POA: Diagnosis not present

## 2020-04-10 DIAGNOSIS — J301 Allergic rhinitis due to pollen: Secondary | ICD-10-CM | POA: Diagnosis not present

## 2020-04-10 DIAGNOSIS — F3341 Major depressive disorder, recurrent, in partial remission: Secondary | ICD-10-CM

## 2020-04-10 NOTE — Progress Notes (Signed)
[facility]  Patient ID: Beverly Ballard,  MRN: [id number]  PCP: Caren Macadam, MD  Date of Evaluation: 04/10/2020 Time spent:30 minutes  Chief Complaint:  Chief Complaint    Anxiety; Depression      HISTORY/CURRENT STATUS: HPI For routine med check.   Had a fall 4-5 weeks ago. Fell while walking and tripped. Had to go to ER. Had laceration to right eyebrow that had to be sutured. No LOC. In a lot of pain in hip and is now in PT. Gradually getting better.   If it wasn't for this setback, she'd be doing well mentally.  She is able to enjoy things.  Energy and motivation are good.  She sleeps well.  She has had to take more Ativan since the fall, but never more than 1 a day.  Does not cry easily.  Appetite is normal.  Weight is stable.  No suicidal or homicidal thoughts.  Patient denies increased energy with decreased need for sleep, no increased talkativeness, no racing thoughts, no impulsivity or risky behaviors, no increased spending, no increased libido, no grandiosity, no increased irritability or anger, no paranoia,and no hallucinations.  Denies dizziness, syncope, seizures, numbness, tingling, tremor, tics, unsteady gait, slurred speech, confusion. Denies muscle or joint pain, stiffness, or dystonia.  Individual Medical History/ Review of Systems: Changes? :Yes  See HPI   Past medications for mental health diagnoses include: Paxil, Prozac, Valium  Allergies: Lamotrigine, Bupropion, Carbamazepine, Levetiracetam, Tramadol, Adhesive [tape], Clarithromycin, Doxycycline, Nickel, Other, Topamax [topiramate], Estradiol, Latex, and Phenytoin sodium extended  Current Medications:  Current Outpatient Medications:  .  albuterol (VENTOLIN HFA) 108 (90 Base) MCG/ACT inhaler, Inhale 2 puffs into the lungs every 4 (four) hours as needed for wheezing or shortness of breath., Disp: 18 g, Rfl: 0 .  aspirin 81 MG tablet, Take 81 mg by mouth daily., Disp: , Rfl:  .  b complex  vitamins tablet, Take 1 tablet by mouth daily., Disp: , Rfl:  .  beta carotene w/minerals (OCUVITE) tablet, Take 1 tablet by mouth daily., Disp: , Rfl:  .  Cholecalciferol (VITAMIN D3 PO), Take by mouth., Disp: , Rfl:  .  cyclobenzaprine (FLEXERIL) 10 MG tablet, Take 1 tablet (10 mg total) by mouth 2 (two) times daily as needed for muscle spasms., Disp: 30 tablet, Rfl: 0 .  EPINEPHrine 0.3 mg/0.3 mL IJ SOAJ injection, SMARTSIG:Injection As Directed, Disp: , Rfl:  .  esomeprazole (NEXIUM) 20 MG capsule, Take 2 capsules (40 mg total) by mouth daily before breakfast. (Patient taking differently: Take 20 mg by mouth daily before breakfast.), Disp: 2 capsule, Rfl: 0 .  furosemide (LASIX) 20 MG tablet, TAKE 1 TABLET BY MOUTH IN THE MORNING, Disp: 30 tablet, Rfl: 1 .  levocetirizine (XYZAL) 5 MG tablet, Take 5 mg by mouth daily. , Disp: , Rfl:  .  levothyroxine (SYNTHROID) 88 MCG tablet, Take 1 tablet (88 mcg total) by mouth daily., Disp: 90 tablet, Rfl: 3 .  lisinopril (ZESTRIL) 5 MG tablet, Take 1 tablet (5 mg total) by mouth daily., Disp: 90 tablet, Rfl: 1 .  LORazepam (ATIVAN) 0.5 MG tablet, Take 1 tablet (0.5 mg total) by mouth 4 (four) times daily as needed for anxiety., Disp: 60 tablet, Rfl: 0 .  methocarbamol (ROBAXIN) 500 MG tablet, Take 1 tablet (500 mg total) by mouth 2 (two) times daily as needed., Disp: 20 tablet, Rfl: 0 .  pravastatin (PRAVACHOL) 20 MG tablet, Take 1 tablet (20 mg total) by mouth daily. TAKE  1 TABLET BY MOUTH EVERY DAY, Disp: 90 tablet, Rfl: 1 .  sertraline (ZOLOFT) 100 MG tablet, TAKE 2 TABLETS BY MOUTH EVERY DAY, Disp: 180 tablet, Rfl: 0 .  Spacer/Aero-Holding Chambers (AEROCHAMBER PLUS) inhaler, Use as instructed, Disp: 1 each, Rfl: 0 .  Spacer/Aero-Holding Chambers DEVI, Use as directed with inhaler, Disp: 1 each, Rfl: 0 .  zonisamide (ZONEGRAN) 100 MG capsule, Take 2 capsules twice a day, Disp: 360 capsule, Rfl: 3 .  ondansetron (ZOFRAN ODT) 4 MG disintegrating tablet,  Take 1 tablet (4 mg total) by mouth every 6 (six) hours as needed for nausea or vomiting. (Patient not taking: Reported on 04/10/2020), Disp: 30 tablet, Rfl: 3 .  oxyCODONE-acetaminophen (PERCOCET) 5-325 MG tablet, Take 1 tablet by mouth every 6 (six) hours as needed for severe pain., Disp: 12 tablet, Rfl: 0 Medication Side Effects: none  Family Medical/ Social History: Changes? No  MENTAL HEALTH EXAM:  There were no vitals taken for this visit.There is no height or weight on file to calculate BMI.  General Appearance: Casual, Neat, Well Groomed and Obese  Eye Contact:  Good  Speech:  Clear and Coherent  Volume:  Normal  Mood:  Euthymic  Affect:  Appropriate  Thought Process:  Goal Directed  Orientation:  Full (Time, Place, and Person)  Thought Content: Logical   Suicidal Thoughts:  No  Homicidal Thoughts:  No  Memory:  WNL  Judgement:  Good  Insight:  Good  Psychomotor Activity:  She is walking slowly, using a cane.  Concentration:  Concentration: Good and Attention Span: Good  Recall:  Good  Fund of Knowledge: Good  Language: Good  Assets:  Desire for Improvement  ADL's:  Intact  Cognition: WNL  Prognosis:  Good   02/22/2020 In ER: CT scan of head and neck are unremarkable.  X-ray of the hip showed no fracture.   03/30/2020 CBC nl, BMP nl, glucose 107 non-fasting.    DIAGNOSES:    ICD-10-CM   1. Recurrent major depressive disorder, in partial remission ([place])  F33.41   2. Generalized anxiety disorder  F41.1     Receiving Psychotherapy: No  Has an appt with Trey Paula   RECOMMENDATIONS:  PDMP unable to be reviewed. I provided 30 minutes of face to face time during this encounter, reviewing ER notes, radiology studies and labs. I am sorry that she went through this fall but I am glad she is doing well as far as her mental health goes.  I see no reason to make any changes. Continue Zoloft 100 mg 2 p.o. daily. Continue Ativan 0.5 mg, 1 up to 4 times a day as  needed. Continue use of CPAP. Continue therapy. Return in 4 months.  Donnal Moat, PA-C

## 2020-04-11 ENCOUNTER — Ambulatory Visit: Payer: Medicare HMO | Admitting: Physical Therapy

## 2020-04-11 ENCOUNTER — Encounter: Payer: Self-pay | Admitting: Physical Therapy

## 2020-04-11 DIAGNOSIS — R600 Localized edema: Secondary | ICD-10-CM

## 2020-04-11 DIAGNOSIS — M25662 Stiffness of left knee, not elsewhere classified: Secondary | ICD-10-CM | POA: Diagnosis not present

## 2020-04-11 DIAGNOSIS — M542 Cervicalgia: Secondary | ICD-10-CM

## 2020-04-11 DIAGNOSIS — M25511 Pain in right shoulder: Secondary | ICD-10-CM | POA: Diagnosis not present

## 2020-04-11 DIAGNOSIS — G8929 Other chronic pain: Secondary | ICD-10-CM | POA: Diagnosis not present

## 2020-04-11 DIAGNOSIS — M25561 Pain in right knee: Secondary | ICD-10-CM | POA: Diagnosis not present

## 2020-04-11 DIAGNOSIS — M6281 Muscle weakness (generalized): Secondary | ICD-10-CM | POA: Diagnosis not present

## 2020-04-11 DIAGNOSIS — R2689 Other abnormalities of gait and mobility: Secondary | ICD-10-CM | POA: Diagnosis not present

## 2020-04-11 DIAGNOSIS — R69 Illness, unspecified: Secondary | ICD-10-CM | POA: Diagnosis not present

## 2020-04-11 DIAGNOSIS — M25562 Pain in left knee: Secondary | ICD-10-CM

## 2020-04-11 NOTE — Therapy (Signed)
[facility] Physical Therapy [address] [place], [place], [address] Phone: [phone]   Fax:  [phone]  Physical Therapy Treatment  Patient Details  Name: Beverly Ballard MRN: [id number] Date of Birth: 12/14/1945 Referring Provider (PT): Aundra Dubin, [place]   Encounter Date: 04/11/2020   PT End of Session - 04/11/20 1555    Visit Number 3    Number of Visits 15    Date for PT Re-Evaluation 06/28/20    Authorization Type aetna MCR    Progress Note Due on Visit 10    PT Start Time 1508    PT Stop Time 1550    PT Time Calculation (min) 42 min    Activity Tolerance Patient tolerated treatment well    Behavior During Therapy [facility] for tasks assessed/performed           Past Medical History:  Diagnosis Date   Allergy    Anxiety and depression 12/19/2009   Arthritis    Cancer ([place])    vaginal   Constipation    Degenerative disorder of eye    Diverticulosis    Essential hypertension, benign 06/26/2009   GERD 05/27/2007   takes Nexium daily   Heart murmur    yrs ago   History of gout    History of migraine many yrs ago   Hyperlipidemia    takes Pravastatin daily   Hypothyroidism    Insomnia    but doesn't take any meds   LOW BACK PAIN 05/27/2007   Numbness    left toes    Osteoporosis    Seizures ([place])    last 1983, none since then- last seizure 1988 per pt     Past Surgical History:  Procedure Laterality Date   ADENOIDECTOMY     BACK SURGERY     lumbar x2   BRONCHIAL BIOPSY  09/12/2019   Procedure: BRONCHIAL BIOPSIES;  Surgeon: Laurin Coder, MD;  Location: [facility];  Service: Endoscopy;;   BRONCHIAL WASHINGS  09/12/2019   Procedure: BRONCHIAL WASHINGS;  Surgeon: Laurin Coder, MD;  [facility]: [facility];  Service: Endoscopy;;   COLONOSCOPY  2009   ESOPHAGOGASTRODUODENOSCOPY     Heel spurs Bilateral    HEMOSTASIS CONTROL  09/12/2019   Procedure: HEMOSTASIS CONTROL;  Surgeon: Laurin Coder,  MD;  [facility]: [facility];  Service: Endoscopy;;  epi   KNEE ARTHROPLASTY Left 11/07/2013   Procedure: COMPUTER ASSISTED TOTAL KNEE ARTHROPLASTY;  Surgeon: Marybelle Killings, MD;  Location: [place];  Service: Orthopedics;  Laterality: Left;  Left Total Knee Arthroplasty, Cemented, Computer Assist   TONSILLECTOMY     TOTAL KNEE REVISION Left 09/19/2014   Procedure: LEFT TOTAL KNEE REVISION;  Surgeon: Leandrew Koyanagi, MD;  Location: [place];  Service: Orthopedics;  Laterality: Left;   UPPER GASTROINTESTINAL ENDOSCOPY     VIDEO BRONCHOSCOPY N/A 09/12/2019   Procedure: VIDEO BRONCHOSCOPY WITH FLUORO;  Surgeon: Laurin Coder, MD;  [facility];  Service: Endoscopy;  Laterality: N/A;    There were no vitals filed for this visit.   Subjective Assessment - 04/11/20 1525    Subjective relays about 5/10 widespread pain today    Pertinent History PMH: Lt TKA revision 2016, depression, fatigue, obesity,osteopenia, lumbar surgery X2    Limitations Standing;Walking;Lifting    How long can you stand comfortably? 5 min (more limited by back pain than knee pain she states)    How long can you walk comfortably? 5 min    Diagnostic tests "XR  Lt knee X-rays demonstrate a well-seated prosthesis without complication. XR Rt knee Moderate degenerative changes medial and patellofemoral compartments"    Patient Stated Goals get the pain down, improve function    Pain Onset More than a month ago             [facility] Adult PT Treatment/Exercise - 04/11/20 0001      Neuro Re-ed    Neuro Re-ed Details  tandem walk with one UE support 3 trips, balance with eyes closed 20 sec X 3 (close supervsion), SLS with one UE support 20 sec X 3      Neck Exercises: Seated   Neck Retraction 10 reps    Other Seated Exercise neck rotation MWM with towel X 10 reps bilat holding 5 sec    Other Seated Exercise scapular retraction 5 sec holds X 15      Knee/Hip Exercises: Machines for Strengthening   Total Gym Leg Press 68  lbs bilat push 3X10      Knee/Hip Exercises: Seated   Sit to [place] without UE support;10 reps   raised mat surface     Manual Therapy   Manual therapy comments Rt uuper trap STM, T.P. release      Neck Exercises: Stretches   Upper Trapezius Stretch Right;Left;2 reps;30 seconds                    PT Short Term Goals - 04/06/20 1330      PT SHORT TERM GOAL #1   Title Pt will be I and compliant with HEP.    Time 4    Period Weeks    Status New    Target Date 03/05/20      PT SHORT TERM GOAL #2   Title Pt will perform TUG, 5TSTS, and BERG balance tests and will write goals for this after.    Time 4    Period Weeks    Status Achieved      PT SHORT TERM GOAL #3   Title Pt will improve BERG balance score to at least 37 to show improved balance    Baseline 32    Status New             PT Long Term Goals - 04/06/20 1331      PT LONG TERM GOAL #1   Title Pt will improve overall hip strength to 4+ and knee strength to 5 and Rt shoulder strength to 4+ tested in sitting to improve function.    Baseline 4 overall    Time 8    Period Weeks    Status On-going      PT LONG TERM GOAL #2   Title Pt will improve 5TSTS test to less than 14 sec to show improved balance and leg endurance    Baseline 15.5 on 12/17    Time 8    Period Weeks    Status On-going      PT LONG TERM GOAL #3   Title Pt will score at least 42 on BERG to show improved balance    Baseline 32 on 12/17    Time 8    Period Weeks    Status New      PT LONG TERM GOAL #4   Title Pt will be able to stand and walk at least 15 minutes with overall 4 or less pain out of 10.    Baseline 7 pain after 5 min    Status On-going  Plan - 04/11/20 1559    Clinical Impression Statement worked on balance, leg strength, and neck mobility today with good overall tolerance and she relays she feels better after session. PT will continue to progress these functional limitations as able.     Personal Factors and Comorbidities Comorbidity 3+    Comorbidities PMH: Lt TKA revision 2016, depression, fatigue, obesity,osteopenia, lumbar surgery X2    Examination-Activity Limitations Lift;Stand;Squat;Stairs;Locomotion Level;Bend;Reach Overhead;Carry    Examination-Participation Restrictions Cleaning;Community Activity;Driving;Shop;Laundry    Stability/Clinical Decision Making Evolving/Moderate complexity    Rehab Potential Good    PT Frequency 2x / week    PT Duration 8 weeks    PT Treatment/Interventions ADLs/Self Care Home Management;Cryotherapy;Electrical Stimulation;Iontophoresis 4mg /ml Dexamethasone;Moist Heat;Ultrasound;Gait training;Stair training;Therapeutic activities;Therapeutic exercise;Balance training;Neuromuscular re-education;Manual techniques;Passive range of motion;Dry needling;Joint Manipulations;Taping;Vasopneumatic Device;Traction    PT Next Visit Plan needs balance and shoulder/hip/knee strength, ROM, and gentle strength progression. modalaties for pain PRN    PT Home Exercise Plan Access Code: [email or web address]    Consulted and Agree with Plan of Care Patient           Patient will benefit from skilled therapeutic intervention in order to improve the following deficits and impairments:  Decreased activity tolerance,Abnormal gait,Decreased mobility,Decreased strength,Decreased range of motion,Difficulty walking,Impaired flexibility,Increased muscle spasms,Pain,Postural dysfunction,Obesity,Decreased endurance  Visit Diagnosis: Chronic pain of left knee  Chronic pain of right knee  Stiffness of left knee, not elsewhere classified  Muscle weakness (generalized)  Other abnormalities of gait and mobility  Cervicalgia  Acute pain of right shoulder  Localized edema     Problem List Patient Active Problem List   Diagnosis Date Noted   Ankle edema, bilateral 10/31/2019   Excessive daytime sleepiness 06/28/2019   Shortness of breath 06/28/2019   Healthcare  maintenance 06/28/2019   ANA positive 06/28/2019   Bronchiectasis ([place]) 06/28/2019   Depression, major, single episode, moderate ([place]) 11/19/2018   Fatigue 03/23/2018   Recurrent major depressive disorder, in partial remission ([place]) 05/04/2017   Left knee pain 01/12/2017   Ovarian cyst 10/30/2016   Aortic atherosclerosis ([place]) 11/19/2015   Hyperlipemia 04/03/2015   S/P revision of total knee 09/19/2014   Seizure disorder ([place]) 01/24/2014   Obesity, BMI unknown 01/24/2014   Osteopenia, stable on dexa 2010 - followed by gyn 06/15/2012   Pap smear abnormality of cervix/human papillomavirus (HPV) positive - 09/2011, followed by gyn 06/15/2012   Essential hypertension 07/03/2009   Hypothyroidism 05/27/2007   Allergic rhinitis 05/27/2007   GERD 05/27/2007    Silvestre Mesi 04/11/2020, 4:00 PM  [facility] Physical Therapy [address] [place], [place], [address] Phone: [phone]   Fax:  [phone]  Name: Beverly Ballard MRN: [id number] Date of Birth: 03-21-1946

## 2020-04-15 ENCOUNTER — Other Ambulatory Visit: Payer: Self-pay | Admitting: Family Medicine

## 2020-04-17 ENCOUNTER — Other Ambulatory Visit: Payer: Self-pay

## 2020-04-17 ENCOUNTER — Ambulatory Visit: Payer: Medicare HMO | Admitting: Orthotics

## 2020-04-17 DIAGNOSIS — M21279 Flexion deformity, unspecified ankle and toes: Secondary | ICD-10-CM

## 2020-04-17 DIAGNOSIS — M216X9 Other acquired deformities of unspecified foot: Secondary | ICD-10-CM

## 2020-04-17 DIAGNOSIS — Z7409 Other reduced mobility: Secondary | ICD-10-CM

## 2020-04-17 MED ORDER — ALBUTEROL SULFATE HFA 108 (90 BASE) MCG/ACT IN AERS: 2.0000 | INHALATION_SPRAY | RESPIRATORY_TRACT | 0 refills | Status: DC | PRN

## 2020-04-17 NOTE — Progress Notes (Signed)
Cast for Az balance brace(s), as he has moderate to severe gait instability/issues with balance.  This is also due to pes cavus foot type and lateral anlke instability.  Plan on 5* valgus RF posting on device as well as hugging arch to give her wider base of support.

## 2020-04-23 ENCOUNTER — Other Ambulatory Visit: Payer: Self-pay | Admitting: Family Medicine

## 2020-04-24 ENCOUNTER — Encounter: Payer: Medicare HMO | Admitting: Physical Therapy

## 2020-04-26 ENCOUNTER — Encounter: Payer: Self-pay | Admitting: Physical Therapy

## 2020-04-26 ENCOUNTER — Other Ambulatory Visit: Payer: Self-pay

## 2020-04-26 ENCOUNTER — Ambulatory Visit: Payer: Medicare HMO | Admitting: Physical Therapy

## 2020-04-26 DIAGNOSIS — M25662 Stiffness of left knee, not elsewhere classified: Secondary | ICD-10-CM

## 2020-04-26 DIAGNOSIS — M6281 Muscle weakness (generalized): Secondary | ICD-10-CM | POA: Diagnosis not present

## 2020-04-26 DIAGNOSIS — M542 Cervicalgia: Secondary | ICD-10-CM

## 2020-04-26 DIAGNOSIS — M25511 Pain in right shoulder: Secondary | ICD-10-CM

## 2020-04-26 DIAGNOSIS — R2689 Other abnormalities of gait and mobility: Secondary | ICD-10-CM | POA: Diagnosis not present

## 2020-04-26 DIAGNOSIS — G8929 Other chronic pain: Secondary | ICD-10-CM

## 2020-04-26 DIAGNOSIS — M25562 Pain in left knee: Secondary | ICD-10-CM

## 2020-04-26 DIAGNOSIS — M25561 Pain in right knee: Secondary | ICD-10-CM | POA: Diagnosis not present

## 2020-04-26 NOTE — Therapy (Signed)
[facility] Physical Therapy [address] [place], [place], [address] Phone: [phone]   Fax:  [phone]  Physical Therapy Treatment  Patient Details  Name: Beverly Ballard MRN: [id number] Date of Birth: 1946/02/08 Referring Provider (PT): Aundra Dubin, [place]   Encounter Date: 04/26/2020   PT End of Session - 04/26/20 1232    Visit Number 4    Number of Visits 15    Date for PT Re-Evaluation 06/28/20    Authorization Type aetna MCR    Progress Note Due on Visit 10    PT Start Time [id number]    PT Stop Time 1227    PT Time Calculation (min) 42 min    Activity Tolerance Patient tolerated treatment well    Behavior During Therapy [facility] for tasks assessed/performed           Past Medical History:  Diagnosis Date  . Allergy   . Anxiety and depression 12/19/2009  . Arthritis   . Cancer ([facility])    vaginal  . Constipation   . Degenerative disorder of eye   . Diverticulosis   . Essential hypertension, benign 06/26/2009  . GERD 05/27/2007   takes Nexium daily  . Heart murmur    yrs ago  . History of gout   . History of migraine many yrs ago  . Hyperlipidemia    takes Pravastatin daily  . Hypothyroidism   . Insomnia    but doesn't take any meds  . LOW BACK PAIN 05/27/2007  . Numbness    left toes   . Osteoporosis   . Seizures ([place])    last 1983, none since then- last seizure 1988 per pt     Past Surgical History:  Procedure Laterality Date  . ADENOIDECTOMY    . BACK SURGERY     lumbar x2  . BRONCHIAL BIOPSY  09/12/2019   Procedure: BRONCHIAL BIOPSIES;  Surgeon: Laurin Coder, MD;  [facility];  Service: Endoscopy;;  . BRONCHIAL WASHINGS  09/12/2019   Procedure: BRONCHIAL WASHINGS;  Surgeon: Laurin Coder, MD;  [facility];  Service: Endoscopy;;  . COLONOSCOPY  2009  . ESOPHAGOGASTRODUODENOSCOPY    . Heel spurs Bilateral   . HEMOSTASIS CONTROL  09/12/2019   Procedure: HEMOSTASIS CONTROL;  Surgeon: Laurin Coder,  MD;  [facility];  Service: Endoscopy;;  epi  . KNEE ARTHROPLASTY Left 11/07/2013   Procedure: COMPUTER ASSISTED TOTAL KNEE ARTHROPLASTY;  Surgeon: Marybelle Killings, MD;  Location: [place];  Service: Orthopedics;  Laterality: Left;  Left Total Knee Arthroplasty, Cemented, Computer Assist  . TONSILLECTOMY    . TOTAL KNEE REVISION Left 09/19/2014   Procedure: LEFT TOTAL KNEE REVISION;  Surgeon: Leandrew Koyanagi, MD;  Location: [place];  Service: Orthopedics;  Laterality: Left;  . UPPER GASTROINTESTINAL ENDOSCOPY    . VIDEO BRONCHOSCOPY N/A 09/12/2019   Procedure: VIDEO BRONCHOSCOPY WITH FLUORO;  Surgeon: Laurin Coder, MD;  [facility];  Service: Endoscopy;  Laterality: N/A;    There were no vitals filed for this visit.   Subjective Assessment - 04/26/20 1147    Subjective feels a little SOB today, otherwise no real pain today. neck is sometimes up to a 10/10    Pertinent History PMH: Lt TKA revision 2016, depression, fatigue, obesity,osteopenia, lumbar surgery X2    Limitations Standing;Walking;Lifting    How long can you stand comfortably? 5 min (more limited by back pain than knee pain she states)    How long can  you walk comfortably? 5 min    Diagnostic tests "XR Lt knee X-rays demonstrate a well-seated prosthesis without complication. XR Rt knee Moderate degenerative changes medial and patellofemoral compartments"    Patient Stated Goals get the pain down, improve function    Currently in Pain? No/denies                             [facility] Adult PT Treatment/Exercise - 04/26/20 1149      Exercises   Exercises Knee/Hip      Knee/Hip Exercises: Aerobic   Nustep L4 X 8 min UE/LE      Knee/Hip Exercises: Machines for Strengthening   Cybex Knee Extension 10# 3x10 bil push    Cybex Knee Flexion 15# 3x10 bil push      Knee/Hip Exercises: Standing   Heel Raises Both;20 reps      Knee/Hip Exercises: Seated   Long Arc Quad Both;2 sets;10 reps    Long Arc  Quad Weight 2 lbs.    Marching Both;10 reps;3 sets    Marching Weights 2 lbs.    Sit to [facility] without UE support;10 reps               Balance Exercises - 04/26/20 1203      Balance Exercises: Standing   Tandem Stance Eyes open;Upper extremity support 1;30 secs;3 reps   bil   SLS Solid surface;Eyes open;Upper extremity support 2;5 reps;10 secs   bil              PT Short Term Goals - 04/06/20 1330      PT SHORT TERM GOAL #1   Title Pt will be I and compliant with HEP.    Time 4    Period Weeks    Status New    Target Date 03/05/20      PT SHORT TERM GOAL #2   Title Pt will perform TUG, 5TSTS, and BERG balance tests and will write goals for this after.    Time 4    Period Weeks    Status Achieved      PT SHORT TERM GOAL #3   Title Pt will improve BERG balance score to at least 37 to show improved balance    Baseline 32    Status New             PT Long Term Goals - 04/06/20 1331      PT LONG TERM GOAL #1   Title Pt will improve overall hip strength to 4+ and knee strength to 5 and Rt shoulder strength to 4+ tested in sitting to improve function.    Baseline 4 overall    Time 8    Period Weeks    Status On-going      PT LONG TERM GOAL #2   Title Pt will improve 5TSTS test to less than 14 sec to show improved balance and leg endurance    Baseline 15.5 on 12/17    Time 8    Period Weeks    Status On-going      PT LONG TERM GOAL #3   Title Pt will score at least 42 on BERG to show improved balance    Baseline 32 on 12/17    Time 8    Period Weeks    Status New      PT LONG TERM GOAL #4   Title Pt will be able to stand and walk at  least 15 minutes with overall 4 or less pain out of 10.    Baseline 7 pain after 5 min    Status On-going                 Plan - 04/26/20 1233    Clinical Impression Statement Pt tolerated session well today with focus on LE strengthening and balance per pt's request.  Neck pain rated 0/10 so did not focus on  this today.  Overall progressing well with PT and will continue to benefit from PT to maximize function.    Personal Factors and Comorbidities Comorbidity 3+    Comorbidities PMH: Lt TKA revision 2016, depression, fatigue, obesity,osteopenia, lumbar surgery X2    Examination-Activity Limitations Lift;Stand;Squat;Stairs;Locomotion Level;Bend;Reach Overhead;Carry    Examination-Participation Restrictions Cleaning;Community Activity;Driving;Shop;Laundry    Stability/Clinical Decision Making Evolving/Moderate complexity    Rehab Potential Good    PT Frequency 2x / week    PT Duration 8 weeks    PT Treatment/Interventions ADLs/Self Care Home Management;Cryotherapy;Electrical Stimulation;Iontophoresis 4mg /ml Dexamethasone;Moist Heat;Ultrasound;Gait training;Stair training;Therapeutic activities;Therapeutic exercise;Balance training;Neuromuscular re-education;Manual techniques;Passive range of motion;Dry needling;Joint Manipulations;Taping;Vasopneumatic Device;Traction    PT Next Visit Plan needs balance and shoulder/hip/knee strength, ROM, and gentle strength progression. modalities for pain PRN    PT Home Exercise Plan Access Code: [email or web address]    Consulted and Agree with Plan of Care Patient           Patient will benefit from skilled therapeutic intervention in order to improve the following deficits and impairments:  Decreased activity tolerance,Abnormal gait,Decreased mobility,Decreased strength,Decreased range of motion,Difficulty walking,Impaired flexibility,Increased muscle spasms,Pain,Postural dysfunction,Obesity,Decreased endurance  Visit Diagnosis: Chronic pain of left knee  Chronic pain of right knee  Stiffness of left knee, not elsewhere classified  Muscle weakness (generalized)  Other abnormalities of gait and mobility  Cervicalgia  Acute pain of right shoulder     Problem List Patient Active Problem List   Diagnosis Date Noted  . Ankle edema, bilateral 10/31/2019  .  Excessive daytime sleepiness 06/28/2019  . Shortness of breath 06/28/2019  . Healthcare maintenance 06/28/2019  . ANA positive 06/28/2019  . Bronchiectasis ([place]) 06/28/2019  . Depression, major, single episode, moderate ([place]) 11/19/2018  . Fatigue 03/23/2018  . Recurrent major depressive disorder, in partial remission ([place]) 05/04/2017  . Left knee pain 01/12/2017  . Ovarian cyst 10/30/2016  . Aortic atherosclerosis ([place]) 11/19/2015  . Hyperlipemia 04/03/2015  . S/P revision of total knee 09/19/2014  . Seizure disorder ([place]) 01/24/2014  . Obesity, BMI unknown 01/24/2014  . Osteopenia, stable on dexa 2010 - followed by gyn 06/15/2012  . Pap smear abnormality of cervix/human papillomavirus (HPV) positive - 09/2011, followed by gyn 06/15/2012  . Essential hypertension 07/03/2009  . Hypothyroidism 05/27/2007  . Allergic rhinitis 05/27/2007  . GERD 05/27/2007      Laureen Abrahams, PT, DPT 04/26/20 12:35 PM    [facility] [address] [place], [place], [address] Phone: [phone]   Fax:  [phone]  Name: Beverly Ballard MRN: [id number] Date of Birth: December 04, 1945

## 2020-04-30 ENCOUNTER — Encounter: Payer: Medicare HMO | Admitting: Physical Therapy

## 2020-05-01 DIAGNOSIS — G4733 Obstructive sleep apnea (adult) (pediatric): Secondary | ICD-10-CM | POA: Diagnosis not present

## 2020-05-02 ENCOUNTER — Other Ambulatory Visit: Payer: Self-pay

## 2020-05-02 ENCOUNTER — Ambulatory Visit: Payer: Medicare HMO | Admitting: Physical Therapy

## 2020-05-02 DIAGNOSIS — R2689 Other abnormalities of gait and mobility: Secondary | ICD-10-CM | POA: Diagnosis not present

## 2020-05-02 DIAGNOSIS — M25562 Pain in left knee: Secondary | ICD-10-CM

## 2020-05-02 DIAGNOSIS — M25662 Stiffness of left knee, not elsewhere classified: Secondary | ICD-10-CM

## 2020-05-02 DIAGNOSIS — J3081 Allergic rhinitis due to animal (cat) (dog) hair and dander: Secondary | ICD-10-CM | POA: Diagnosis not present

## 2020-05-02 DIAGNOSIS — M25561 Pain in right knee: Secondary | ICD-10-CM | POA: Diagnosis not present

## 2020-05-02 DIAGNOSIS — J3089 Other allergic rhinitis: Secondary | ICD-10-CM | POA: Diagnosis not present

## 2020-05-02 DIAGNOSIS — M542 Cervicalgia: Secondary | ICD-10-CM | POA: Diagnosis not present

## 2020-05-02 DIAGNOSIS — J301 Allergic rhinitis due to pollen: Secondary | ICD-10-CM | POA: Diagnosis not present

## 2020-05-02 DIAGNOSIS — M25511 Pain in right shoulder: Secondary | ICD-10-CM

## 2020-05-02 DIAGNOSIS — G8929 Other chronic pain: Secondary | ICD-10-CM | POA: Diagnosis not present

## 2020-05-02 DIAGNOSIS — R600 Localized edema: Secondary | ICD-10-CM

## 2020-05-02 DIAGNOSIS — M6281 Muscle weakness (generalized): Secondary | ICD-10-CM | POA: Diagnosis not present

## 2020-05-02 NOTE — Therapy (Signed)
[facility] Physical Therapy [address] [place], [place], [address] Phone: [phone]   Fax:  [phone]  Physical Therapy Treatment  Patient Details  Name: Beverly Ballard MRN: [id number] Date of Birth: [birth year] Referring Provider (PT): Aundra Dubin, [place]   Encounter Date: 05/02/2020   PT End of Session - 05/02/20 1149    Visit Number 5    Number of Visits 15    Date for PT Re-Evaluation 06/28/20    Authorization Type aetna MCR    Progress Note Due on Visit 10    PT Start Time 1104    PT Stop Time 1202    PT Time Calculation (min) 58 min    Activity Tolerance Patient tolerated treatment well    Behavior During Therapy [facility] for tasks assessed/performed           Past Medical History:  Diagnosis Date  . Allergy   . Anxiety and depression 12/19/2009  . Arthritis   . Cancer (HCC)    vaginal  . Constipation   . Degenerative disorder of eye   . Diverticulosis   . Essential hypertension, benign 06/26/2009  . GERD 05/27/2007   takes Nexium daily  . Heart murmur    yrs ago  . History of gout   . History of migraine many yrs ago  . Hyperlipidemia    takes Pravastatin daily  . Hypothyroidism   . Insomnia    but doesn't take any meds  . LOW BACK PAIN 05/27/2007  . Numbness    left toes   . Osteoporosis   . Seizures ([place])    last 1983, none since then- last seizure 1988 per pt     Past Surgical History:  Procedure Laterality Date  . ADENOIDECTOMY    . BACK SURGERY     lumbar x2  . BRONCHIAL BIOPSY  09/12/2019   Procedure: BRONCHIAL BIOPSIES;  Surgeon: Laurin Coder, MD;  Location: [facility];  Service: Endoscopy;;  . BRONCHIAL WASHINGS  09/12/2019   Procedure: BRONCHIAL WASHINGS;  Surgeon: Laurin Coder, MD;  Location: [facility];  Service: Endoscopy;;  . COLONOSCOPY  2009  . ESOPHAGOGASTRODUODENOSCOPY    . Heel spurs Bilateral   . HEMOSTASIS CONTROL  09/12/2019   Procedure: HEMOSTASIS CONTROL;  Surgeon: Laurin Coder,  MD;  [facility]: [facility];  Service: Endoscopy;;  epi  . KNEE ARTHROPLASTY Left 11/07/2013   Procedure: COMPUTER ASSISTED TOTAL KNEE ARTHROPLASTY;  Surgeon: Marybelle Killings, MD;  Location: [place];  Service: Orthopedics;  Laterality: Left;  Left Total Knee Arthroplasty, Cemented, Computer Assist  . TONSILLECTOMY    . TOTAL KNEE REVISION Left 09/19/2014   Procedure: LEFT TOTAL KNEE REVISION;  Surgeon: Leandrew Koyanagi, MD;  Location: [place];  Service: Orthopedics;  Laterality: Left;  . UPPER GASTROINTESTINAL ENDOSCOPY    . VIDEO BRONCHOSCOPY N/A 09/12/2019   Procedure: VIDEO BRONCHOSCOPY WITH FLUORO;  Surgeon: Laurin Coder, MD;  [facility];  Service: Endoscopy;  Laterality: N/A;    There were no vitals filed for this visit.   Subjective Assessment - 05/02/20 1105    Subjective biggest complaint is her neck today and says the neck pain is unbearable, it is on both sides and is a stabbing pain, denies N/T sensations    Pertinent History PMH: Lt TKA revision 2016, depression, fatigue, obesity,osteopenia, lumbar surgery X2    Limitations Standing;Walking;Lifting    How long can you stand comfortably? 5 min (more limited by back pain than knee  pain she states)    How long can you walk comfortably? 5 min    Diagnostic tests "XR Lt knee X-rays demonstrate a well-seated prosthesis without complication. XR Rt knee Moderate degenerative changes medial and patellofemoral compartments"    Patient Stated Goals get the pain down, improve function    Currently in Pain? Yes    Pain Location Neck   some knee stiffness   Pain Onset More than a month ago                             [facility] Adult PT Treatment/Exercise - 05/02/20 0001      Balance   Balance Assessed Yes      Neuro Re-ed    Neuro Re-ed Details  modified tandem stance balance bil 2x 15 sec with UE support PRN, lateral side stepping 104ft 5x, forward walking with side to side head turns 4x 29ft , forward walking with  up/down head motions 4x 12 ft      Knee/Hip Exercises: Stretches   Knee: Self-Stretch Limitations self knee flexion stretch with other leg overpressure seated heelslide 5 min while on heat to her neck      Knee/Hip Exercises: Aerobic   Nustep L5 x 53min seat10 UE/LE      Modalities   Modalities Traction      Traction   Type of Traction Cervical    Min (lbs) 10    Max (lbs) 15    Hold Time pre set program, 3 step progression    Time 15                    PT Short Term Goals - 04/06/20 1330      PT SHORT TERM GOAL #1   Title Pt will be I and compliant with HEP.    Time 4    Period Weeks    Status New    Target Date 03/05/20      PT SHORT TERM GOAL #2   Title Pt will perform TUG, 5TSTS, and BERG balance tests and will write goals for this after.    Time 4    Period Weeks    Status Achieved      PT SHORT TERM GOAL #3   Title Pt will improve BERG balance score to at least 37 to show improved balance    Baseline 32    Status New             PT Long Term Goals - 04/06/20 1331      PT LONG TERM GOAL #1   Title Pt will improve overall hip strength to 4+ and knee strength to 5 and Rt shoulder strength to 4+ tested in sitting to improve function.    Baseline 4 overall    Time 8    Period Weeks    Status On-going      PT LONG TERM GOAL #2   Title Pt will improve 5TSTS test to less than 14 sec to show improved balance and leg endurance    Baseline 15.5 on 12/17    Time 8    Period Weeks    Status On-going      PT LONG TERM GOAL #3   Title Pt will score at least 42 on BERG to show improved balance    Baseline 32 on 12/17    Time 8    Period Weeks    Status New  PT LONG TERM GOAL #4   Title Pt will be able to stand and walk at least 15 minutes with overall 4 or less pain out of 10.    Baseline 7 pain after 5 min    Status On-going                 Plan - 05/02/20 1207    Clinical Impression Statement Pt. noted an improvement in her neck  pain after traction, will continue to explore next visit. Balance and slight mobility exercises were also tolerated well. Will check in with how she feels post tx to determine the focus of next visit.    PT Next Visit Plan check in about post tx of traction through week, incorporate gentle strengthening exercises for neck/knee HEP to increase function and decrease pain, continue traction-possibly progress           Patient will benefit from skilled therapeutic intervention in order to improve the following deficits and impairments:     Visit Diagnosis: Chronic pain of left knee  Chronic pain of right knee  Stiffness of left knee, not elsewhere classified  Muscle weakness (generalized)  Other abnormalities of gait and mobility  Cervicalgia  Acute pain of right shoulder  Localized edema     Problem List Patient Active Problem List   Diagnosis Date Noted  . Ankle edema, bilateral 10/31/2019  . Excessive daytime sleepiness 06/28/2019  . Shortness of breath 06/28/2019  . Healthcare maintenance 06/28/2019  . ANA positive 06/28/2019  . Bronchiectasis ([place]) 06/28/2019  . Depression, major, single episode, moderate ([place]) 11/19/2018  . Fatigue 03/23/2018  . Recurrent major depressive disorder, in partial remission ([place]) 05/04/2017  . Left knee pain 01/12/2017  . Ovarian cyst 10/30/2016  . Aortic atherosclerosis ([place]) 11/19/2015  . Hyperlipemia 04/03/2015  . S/P revision of total knee 09/19/2014  . Seizure disorder ([place]) 01/24/2014  . Obesity, BMI unknown 01/24/2014  . Osteopenia, stable on dexa 2010 - followed by gyn 06/15/2012  . Pap smear abnormality of cervix/human papillomavirus (HPV) positive - 09/2011, followed by gyn 06/15/2012  . Essential hypertension 07/03/2009  . Hypothyroidism 05/27/2007  . Allergic rhinitis 05/27/2007  . GERD 05/27/2007    Silvestre Mesi 05/02/2020, 12:25 PM  [facility] Physical Therapy [address] [place],  [place], [address] Phone: [phone]   Fax:  [phone]  Name: Beverly Ballard MRN: [id number] Date of Birth: 1945/06/24

## 2020-05-04 DIAGNOSIS — J301 Allergic rhinitis due to pollen: Secondary | ICD-10-CM | POA: Diagnosis not present

## 2020-05-04 DIAGNOSIS — J3089 Other allergic rhinitis: Secondary | ICD-10-CM | POA: Diagnosis not present

## 2020-05-04 DIAGNOSIS — J3081 Allergic rhinitis due to animal (cat) (dog) hair and dander: Secondary | ICD-10-CM | POA: Diagnosis not present

## 2020-05-07 ENCOUNTER — Encounter: Payer: Medicare HMO | Admitting: Physical Therapy

## 2020-05-09 ENCOUNTER — Other Ambulatory Visit: Payer: Self-pay

## 2020-05-09 ENCOUNTER — Ambulatory Visit: Payer: Medicare HMO | Admitting: Physical Therapy

## 2020-05-09 ENCOUNTER — Encounter: Payer: Self-pay | Admitting: Physical Therapy

## 2020-05-09 DIAGNOSIS — R600 Localized edema: Secondary | ICD-10-CM | POA: Diagnosis not present

## 2020-05-09 DIAGNOSIS — M6281 Muscle weakness (generalized): Secondary | ICD-10-CM

## 2020-05-09 DIAGNOSIS — M25662 Stiffness of left knee, not elsewhere classified: Secondary | ICD-10-CM

## 2020-05-09 DIAGNOSIS — G8929 Other chronic pain: Secondary | ICD-10-CM | POA: Diagnosis not present

## 2020-05-09 DIAGNOSIS — M25561 Pain in right knee: Secondary | ICD-10-CM

## 2020-05-09 DIAGNOSIS — R2689 Other abnormalities of gait and mobility: Secondary | ICD-10-CM | POA: Diagnosis not present

## 2020-05-09 DIAGNOSIS — G4733 Obstructive sleep apnea (adult) (pediatric): Secondary | ICD-10-CM | POA: Diagnosis not present

## 2020-05-09 DIAGNOSIS — J301 Allergic rhinitis due to pollen: Secondary | ICD-10-CM | POA: Diagnosis not present

## 2020-05-09 DIAGNOSIS — M25511 Pain in right shoulder: Secondary | ICD-10-CM | POA: Diagnosis not present

## 2020-05-09 DIAGNOSIS — M25562 Pain in left knee: Secondary | ICD-10-CM | POA: Diagnosis not present

## 2020-05-09 DIAGNOSIS — J3089 Other allergic rhinitis: Secondary | ICD-10-CM | POA: Diagnosis not present

## 2020-05-09 DIAGNOSIS — J3081 Allergic rhinitis due to animal (cat) (dog) hair and dander: Secondary | ICD-10-CM | POA: Diagnosis not present

## 2020-05-09 NOTE — Therapy (Addendum)
[facility] Physical Therapy [address] [place], [place], [address] Phone: [phone]   Fax:  [phone]  Physical Therapy Treatment/Discharge addendum PHYSICAL THERAPY DISCHARGE SUMMARY  Visits from Start of Care: 5  Current functional level related to goals / functional outcomes: See below   Remaining deficits: See below   Education / Equipment: HEP  Plan: Patient agrees to discharge.  Patient goals were not met. Patient is being discharged due to not returning since the last visit.  ?????   [name], PT, DPT 06/26/20 2:23 PM    During this treatment session, this physical therapist was present, participating in and directing the treatment.   This note has been reviewed and this clinician agrees with the information provided.  [name], PT, DPT 05/09/20 12:34 PM  Patient Details  Name: Beverly Ballard MRN: [id number] Date of Birth: January 28, 1946 Referring Provider (PT): Aundra Dubin, [place]   Encounter Date: 05/09/2020   PT End of Session - 05/09/20 1159    Visit Number 5    Number of Visits 15    Date for PT Re-Evaluation 06/28/20    Authorization Type aetna MCR    Progress Note Due on Visit 10    PT Start Time 1102    PT Stop Time 1155    PT Time Calculation (min) 53 min    Activity Tolerance Patient tolerated treatment well    Behavior During Therapy [facility] for tasks assessed/performed           Past Medical History:  Diagnosis Date  . Allergy   . Anxiety and depression 12/19/2009  . Arthritis   . Cancer (HCC)    vaginal  . Constipation   . Degenerative disorder of eye   . Diverticulosis   . Essential hypertension, benign 06/26/2009  . GERD 05/27/2007   takes Nexium daily  . Heart murmur    yrs ago  . History of gout   . History of migraine many yrs ago  . Hyperlipidemia    takes Pravastatin daily  . Hypothyroidism   . Insomnia    but doesn't take any meds  . LOW BACK PAIN 05/27/2007  . Numbness    left toes   .  Osteoporosis   . Seizures ([place])    last 1983, none since then- last seizure 1988 per pt     Past Surgical History:  Procedure Laterality Date  . ADENOIDECTOMY    . BACK SURGERY     lumbar x2  . BRONCHIAL BIOPSY  09/12/2019   Procedure: BRONCHIAL BIOPSIES;  Surgeon: Laurin Coder, MD;  [facility];  Service: Endoscopy;;  . BRONCHIAL WASHINGS  09/12/2019   Procedure: BRONCHIAL WASHINGS;  Surgeon: Laurin Coder, MD;  [facility];  Service: Endoscopy;;  . COLONOSCOPY  2009  . ESOPHAGOGASTRODUODENOSCOPY    . Heel spurs Bilateral   . HEMOSTASIS CONTROL  09/12/2019   Procedure: HEMOSTASIS CONTROL;  Surgeon: Laurin Coder, MD;  [facility];  Service: Endoscopy;;  epi  . KNEE ARTHROPLASTY Left 11/07/2013   Procedure: COMPUTER ASSISTED TOTAL KNEE ARTHROPLASTY;  Surgeon: Marybelle Killings, MD;  Location: [place];  Service: Orthopedics;  Laterality: Left;  Left Total Knee Arthroplasty, Cemented, Computer Assist  . TONSILLECTOMY    . TOTAL KNEE REVISION Left 09/19/2014   Procedure: LEFT TOTAL KNEE REVISION;  Surgeon: Leandrew Koyanagi, MD;  Location: [place];  Service: Orthopedics;  Laterality: Left;  . UPPER GASTROINTESTINAL ENDOSCOPY    . VIDEO BRONCHOSCOPY  N/A 09/12/2019   Procedure: VIDEO BRONCHOSCOPY WITH FLUORO;  Surgeon: Laurin Coder, MD;  [facility];  Service: Endoscopy;  Laterality: N/A;    There were no vitals filed for this visit.   Subjective Assessment - 05/09/20 1151    Subjective biggest complaint is her neck again today and says the neck pain is persistent and bilateral moving into the shoulder into the arm. She denies any current pain in the shoulder but it hurts when she uses it. She still notes some stiffness in her knee.    Pertinent History PMH: Lt TKA revision 2016, depression, fatigue, obesity,osteopenia, lumbar surgery X2    Limitations Standing;Walking;Lifting    How long can you stand comfortably? 5 min (more limited by  back pain than knee pain she states)    How long can you walk comfortably? 5 min    Diagnostic tests "XR Lt knee X-rays demonstrate a well-seated prosthesis without complication. XR Rt knee Moderate degenerative changes medial and patellofemoral compartments"    Patient Stated Goals get the pain down, improve function    Currently in Pain? No/denies    Pain Location --   shoulder region pain with movement   Pain Onset More than a month ago              [facility] PT Assessment - 05/09/20 0001      Assessment   Medical Diagnosis Chronic pain of both knees, Neck pain and Rt shoulder pain after recent fall.    Referring Provider (PT) Aundra Dubin, PA-C      AROM   Right/Left Shoulder Left    Left Shoulder Flexion 105 Degrees      Strength   Strength Assessment Site Shoulder    Right/Left Shoulder Right;Left    Right Shoulder Flexion 4-/5    Right Shoulder ABduction 3-/5    Right Shoulder Internal Rotation 4/5    Right Shoulder External Rotation 3/5    Left Shoulder Flexion 3/5    Left Shoulder ABduction 3-/5    Left Shoulder Internal Rotation 4-/5    Left Shoulder External Rotation 3/5      Berg Balance Test   Sit to Stand Able to stand without using hands and stabilize independently    Standing Unsupported Able to stand safely 2 minutes    Sitting with Back Unsupported but Feet Supported on Floor or Stool Able to sit safely and securely 2 minutes    Stand to Sit Sits safely with minimal use of hands    Transfers Able to transfer safely, minor use of hands    Standing Unsupported with Eyes Closed Able to stand 10 seconds with supervision    Standing Unsupported with Feet Together Able to place feet together independently and stand 1 minute safely    From Standing, Reach Forward with Outstretched Arm Can reach forward >5 cm safely (2")    From Standing Position, Pick up Object from Floor Able to pick up shoe, needs supervision    From Standing Position, Turn to Look Behind  Over each Shoulder Turn sideways only but maintains balance    Turn 360 Degrees Able to turn 360 degrees safely but slowly    Standing Unsupported, Alternately Place Feet on Step/Stool Able to complete 4 steps without aid or supervision    Standing Unsupported, One Foot in Front Loses balance while stepping or standing    Standing on One Leg Unable to try or needs assist to prevent fall    Total  Score 38                         [facility] Adult PT Treatment/Exercise - 05/09/20 1110      Knee/Hip Exercises: Aerobic   Nustep L5 x 62mn seat9 UE/LE      Traction   Type of Traction Cervical    Min (lbs) 11    Max (lbs) 16    Hold Time pre set program, 3 step progression    Time 15                  PT Education - 05/09/20 1158    Education Details updated HEP    Person(s) Educated Patient    Methods Explanation;Demonstration;Handout    Comprehension Verbalized understanding;Returned demonstration            PT Short Term Goals - 05/09/20 1223      PT SHORT TERM GOAL #1   Title Pt will be I and compliant with HEP.    Time 4    Period Weeks    Status On-going    Target Date 03/05/20      PT SHORT TERM GOAL #2   Title Pt will perform TUG, 5TSTS, and BERG balance tests and will write goals for this after.    Time 4    Period Weeks    Status Achieved      PT SHORT TERM GOAL #3   Title Pt will improve BERG balance score to at least 37 to show improved balance   patient achieved STG by acheiving 38 05/09/20   Baseline 32    Status Achieved      PT SHORT TERM GOAL #4   Title Patient will show increased strength to at least 4/5 for ER flex, and abd    Baseline pt had 3/5 or 4-/5 for all L sided measurements    Time 4    Period Weeks    Status New             PT Long Term Goals - 05/09/20 1225      PT LONG TERM GOAL #1   Title Pt will improve overall hip strength to 4+ and knee strength to 5 and Rt shoulder strength to 4+ tested in sitting to  improve function.    Baseline 4 overall    Time 8    Period Weeks    Status On-going      PT LONG TERM GOAL #2   Title Pt will improve 5TSTS test to less than 14 sec to show improved balance and leg endurance    Baseline 15.5 on 12/17    Time 8    Period Weeks    Status On-going      PT LONG TERM GOAL #3   Title Pt will score at least 42 on BERG to show improved balance    Baseline 32 on 12/17, 38 on 05/09/20    Time 8    Period Weeks    Status On-going      PT LONG TERM GOAL #4   Title Pt will be able to stand and walk at least 15 minutes with overall 4 or less pain out of 10.    Baseline 7 pain after 5 min    Status On-going                 Plan - 05/09/20 1201    Clinical Impression Statement Checked in with short  term goals for balance this visit and retested Berg, patient improved from 32 to 93 with nnotable improvements with 360 degree turns and static balance EO and EC. She still has balance deficits with dynamic balance movements which we will continue to address with hip strength and coordination feedback exercises. Patient has concurrent neck pain but recently has been pinpointing more shoulder pain and weakness that radiates from her neck pain. ROM and strength tests indicated deficits consistent with shoulder impingement on L side accompanied with decreased strength and slightly decreased motion on the R side as well. + hawkins kennedy, empty and full can neers and painful arc indicated on L side. She felt some neck relief from traction in her last visit and patient did well with slight increases in min/max settings. Updated HEP to include periscapular strengthening to address bilateral weakness in shoulders and will continue to address motion restrictions once strength has increased. Pt. will benefit from additional therapy to address her UE limitations and continue to make improvements in her balance and coordination in order to reach her LTGs.    Personal Factors and  Comorbidities Comorbidity 3+    Comorbidities PMH: Lt TKA revision 2016, depression, fatigue, obesity,osteopenia, lumbar surgery X2    Examination-Activity Limitations Lift;Stand;Squat;Stairs;Locomotion Level;Bend;Reach Overhead;Carry    Examination-Participation Restrictions Cleaning;Community Activity;Driving;Shop;Laundry    Stability/Clinical Decision Making Evolving/Moderate complexity    Rehab Potential Good    PT Frequency 2x / week    PT Duration 8 weeks    PT Treatment/Interventions ADLs/Self Care Home Management;Cryotherapy;Electrical Stimulation;Iontophoresis 36m/ml Dexamethasone;Moist Heat;Ultrasound;Gait training;Stair training;Therapeutic activities;Therapeutic exercise;Balance training;Neuromuscular re-education;Manual techniques;Passive range of motion;Dry needling;Joint Manipulations;Taping;Vasopneumatic Device;Traction    PT Next Visit Plan check in with shoulder HEP, incrporate dynamic balance exercises and addtl shoulder exercises for increased strength, KT tape?    PT Home Exercise Plan [email or web address]   Consulted and Agree with Plan of Care Patient           Patient will benefit from skilled therapeutic intervention in order to improve the following deficits and impairments:  Decreased activity tolerance,Abnormal gait,Decreased mobility,Decreased strength,Decreased range of motion,Difficulty walking,Impaired flexibility,Increased muscle spasms,Pain,Postural dysfunction,Obesity,Decreased endurance  Visit Diagnosis: Chronic pain of left knee  Chronic pain of right knee  Stiffness of left knee, not elsewhere classified  Muscle weakness (generalized)  Acute pain of right shoulder  Localized edema  Other abnormalities of gait and mobility     Problem List Patient Active Problem List   Diagnosis Date Noted  . Ankle edema, bilateral 10/31/2019  . Excessive daytime sleepiness 06/28/2019  . Shortness of breath 06/28/2019  . Healthcare maintenance 06/28/2019  . ANA  positive 06/28/2019  . Bronchiectasis ([place] 06/28/2019  . Depression, major, single episode, moderate ([place] 11/19/2018  . Fatigue 03/23/2018  . Recurrent major depressive disorder, in partial remission ([place] 05/04/2017  . Left knee pain 01/12/2017  . Ovarian cyst 10/30/2016  . Aortic atherosclerosis ([place] 11/19/2015  . Hyperlipemia 04/03/2015  . S/P revision of total knee 09/19/2014  . Seizure disorder ([place] 01/24/2014  . Obesity, BMI unknown 01/24/2014  . Osteopenia, stable on dexa 2010 - followed by gyn 06/15/2012  . Pap smear abnormality of cervix/human papillomavirus (HPV) positive - 09/2011, followed by gyn 06/15/2012  . Essential hypertension 07/03/2009  . Hypothyroidism 05/27/2007  . Allergic rhinitis 05/27/2007  . GERD 05/27/2007    LGlenetta Hew SPT 05/09/2020, 12:30 PM  [facility] [address] [address]Phone: [phone]  Fax:  [phone] Name: SYEHUDIS MONCEAUX  MRN: [id number] Date of Birth: [birth year]

## 2020-05-09 NOTE — Patient Instructions (Signed)
Access Code: [email or web address] URL: [email or web address] Date: 05/09/2020 Prepared by: [name]  Exercises Seated Knee Flexion Stretch - 2 x daily - 6 x weekly - 1-2 sets - 10 reps - 5 sec hold Mini Squat with Counter Support - 2 x daily - 6 x weekly - 1-2 sets - 10 reps Seated Cervical Sidebending Stretch - 2 x daily - 6 x weekly - 2-3 reps - 1 sets - 30 sec hold Standing Shoulder Flexion Wall Walk - 1 x daily - 6 x weekly - 1 sets - 10 reps - 5 sec hold Standing Shoulder Scaption Wall Walk - 1 x daily - 6 x weekly - 1 sets - 10 reps - 5 sec hold Standing Romberg to 1/2 Tandem Stance - 2 x daily - 6 x weekly - 1 sets - 2 reps - 20-30 hold Standing Balance in Corner with Eyes Closed - 2 x daily - 6 x weekly - 2-3 reps - 20-30 sec hold Standing Single Leg Stance with Unilateral Counter Support - 2 x daily - 6 x weekly - 1 sets - 3 reps - 10-20 sec hold Standing Row with Anchored Resistance - 2 x daily - 6 x weekly - 10-20 reps - 2-3 sets Shoulder External Rotation and Scapular Retraction with Resistance - 2 x daily - 6 x weekly - 2-3 sets - 10 reps

## 2020-05-10 ENCOUNTER — Other Ambulatory Visit: Payer: Self-pay | Admitting: Family Medicine

## 2020-05-11 ENCOUNTER — Emergency Department (HOSPITAL_COMMUNITY): Payer: Medicare HMO

## 2020-05-11 ENCOUNTER — Encounter (HOSPITAL_COMMUNITY): Payer: Self-pay | Admitting: Emergency Medicine

## 2020-05-11 ENCOUNTER — Emergency Department (HOSPITAL_COMMUNITY)
Admission: EM | Admit: 2020-05-11 | Discharge: 2020-05-11 | Disposition: A | Discharge status: Home / Self Care | Payer: Medicare HMO | Attending: Emergency Medicine | Admitting: Emergency Medicine

## 2020-05-11 ENCOUNTER — Encounter: Payer: Self-pay | Admitting: Internal Medicine

## 2020-05-11 ENCOUNTER — Ambulatory Visit (INDEPENDENT_AMBULATORY_CARE_PROVIDER_SITE_OTHER): Payer: Medicare HMO | Admitting: Internal Medicine

## 2020-05-11 ENCOUNTER — Other Ambulatory Visit: Payer: Self-pay

## 2020-05-11 VITALS — BP 120/78 | HR 90 | Temp 97.5°F | Wt 244.7 lb

## 2020-05-11 DIAGNOSIS — E039 Hypothyroidism, unspecified: Secondary | ICD-10-CM | POA: Insufficient documentation

## 2020-05-11 DIAGNOSIS — K5190 Ulcerative colitis, unspecified, without complications: Secondary | ICD-10-CM | POA: Diagnosis not present

## 2020-05-11 DIAGNOSIS — Z96652 Presence of left artificial knee joint: Secondary | ICD-10-CM | POA: Diagnosis not present

## 2020-05-11 DIAGNOSIS — R0602 Shortness of breath: Secondary | ICD-10-CM

## 2020-05-11 DIAGNOSIS — R5383 Other fatigue: Secondary | ICD-10-CM | POA: Diagnosis not present

## 2020-05-11 DIAGNOSIS — R42 Dizziness and giddiness: Secondary | ICD-10-CM | POA: Diagnosis not present

## 2020-05-11 DIAGNOSIS — I10 Essential (primary) hypertension: Secondary | ICD-10-CM | POA: Insufficient documentation

## 2020-05-11 DIAGNOSIS — R55 Syncope and collapse: Secondary | ICD-10-CM | POA: Diagnosis not present

## 2020-05-11 DIAGNOSIS — Z8544 Personal history of malignant neoplasm of other female genital organs: Secondary | ICD-10-CM | POA: Diagnosis not present

## 2020-05-11 DIAGNOSIS — Z91040 Latex allergy status: Secondary | ICD-10-CM | POA: Diagnosis not present

## 2020-05-11 DIAGNOSIS — Z87891 Personal history of nicotine dependence: Secondary | ICD-10-CM | POA: Insufficient documentation

## 2020-05-11 DIAGNOSIS — Z7982 Long term (current) use of aspirin: Secondary | ICD-10-CM | POA: Insufficient documentation

## 2020-05-11 DIAGNOSIS — K921 Melena: Secondary | ICD-10-CM | POA: Diagnosis not present

## 2020-05-11 DIAGNOSIS — K529 Noninfective gastroenteritis and colitis, unspecified: Secondary | ICD-10-CM

## 2020-05-11 DIAGNOSIS — Z79899 Other long term (current) drug therapy: Secondary | ICD-10-CM | POA: Insufficient documentation

## 2020-05-11 DIAGNOSIS — M791 Myalgia, unspecified site: Secondary | ICD-10-CM

## 2020-05-11 DIAGNOSIS — K625 Hemorrhage of anus and rectum: Secondary | ICD-10-CM | POA: Diagnosis present

## 2020-05-11 DIAGNOSIS — R109 Unspecified abdominal pain: Secondary | ICD-10-CM | POA: Diagnosis not present

## 2020-05-11 DIAGNOSIS — R531 Weakness: Secondary | ICD-10-CM | POA: Diagnosis not present

## 2020-05-11 LAB — COMPREHENSIVE METABOLIC PANEL
ALT: 13 U/L (ref 0–44)
AST: 21 U/L (ref 15–41)
Albumin: 3.6 g/dL (ref 3.5–5.0)
Alkaline Phosphatase: 84 U/L (ref 38–126)
Anion gap: 9 (ref 5–15)
BUN: 11 mg/dL (ref 8–23)
CO2: 26 mmol/L (ref 22–32)
Calcium: 9.4 mg/dL (ref 8.9–10.3)
Chloride: 107 mmol/L (ref 98–111)
Creatinine, Ser: 1.01 mg/dL — ABNORMAL HIGH (ref 0.44–1.00)
GFR, Estimated: 59 mL/min — ABNORMAL LOW (ref >60)
Glucose, Bld: 135 mg/dL — ABNORMAL HIGH (ref 70–99)
Potassium: 3.8 mmol/L (ref 3.5–5.1)
Sodium: 142 mmol/L (ref 135–145)
Total Bilirubin: 0.6 mg/dL (ref 0.3–1.2)
Total Protein: 7.4 g/dL (ref 6.5–8.1)

## 2020-05-11 LAB — CBC
HCT: 43.1 % (ref 36.0–46.0)
Hemoglobin: 13.9 g/dL (ref 12.0–15.0)
MCH: 30 pg (ref 26.0–34.0)
MCHC: 32.3 g/dL (ref 30.0–36.0)
MCV: 93.1 fL (ref 80.0–100.0)
Platelets: 224 10*3/uL (ref 150–400)
RBC: 4.63 MIL/uL (ref 3.87–5.11)
RDW: 13.7 % (ref 11.5–15.5)
WBC: 15.7 10*3/uL — ABNORMAL HIGH (ref 4.0–10.5)
nRBC: 0.3 % — ABNORMAL HIGH (ref 0.0–0.2)

## 2020-05-11 LAB — TYPE AND SCREEN
ABO/RH(D): B NEG
Antibody Screen: NEGATIVE

## 2020-05-11 LAB — BRAIN NATRIURETIC PEPTIDE: B Natriuretic Peptide: 177.7 pg/mL — ABNORMAL HIGH (ref 0.0–100.0)

## 2020-05-11 LAB — TROPONIN I (HIGH SENSITIVITY)
Troponin I (High Sensitivity): 10 ng/L (ref <18)
Troponin I (High Sensitivity): 8 ng/L (ref <18)

## 2020-05-11 LAB — POC OCCULT BLOOD, ED: Fecal Occult Bld: POSITIVE — AB

## 2020-05-11 MED ORDER — MORPHINE SULFATE (PF) 2 MG/ML IV SOLN
2.0000 mg | Freq: Once | INTRAVENOUS | Status: AC
  Administered 2020-05-11: 2 mg via INTRAVENOUS
  Filled 2020-05-11: qty 1

## 2020-05-11 MED ORDER — IOHEXOL 300 MG/ML  SOLN
100.0000 mL | Freq: Once | INTRAMUSCULAR | Status: AC | PRN
  Administered 2020-05-11: 100 mL via INTRAVENOUS

## 2020-05-11 MED ORDER — HYDROCODONE-ACETAMINOPHEN 7.5-325 MG PO TABS
1.0000 | ORAL_TABLET | Freq: Four times a day (QID) | ORAL | Status: DC | PRN
  Administered 2020-05-11: 1 via ORAL
  Filled 2020-05-11: qty 1

## 2020-05-11 MED ORDER — ALBUTEROL SULFATE HFA 108 (90 BASE) MCG/ACT IN AERS: 1.0000 | INHALATION_SPRAY | Freq: Four times a day (QID) | RESPIRATORY_TRACT | 0 refills | Status: DC | PRN

## 2020-05-11 MED ORDER — HYDROCODONE-ACETAMINOPHEN 5-325 MG PO TABS: 1.0000 | ORAL_TABLET | Freq: Four times a day (QID) | ORAL | 0 refills | Status: DC | PRN

## 2020-05-11 MED ORDER — ONDANSETRON HCL 4 MG/2ML IJ SOLN
4.0000 mg | Freq: Once | INTRAMUSCULAR | Status: AC
  Administered 2020-05-11: 4 mg via INTRAVENOUS
  Filled 2020-05-11: qty 2

## 2020-05-11 MED ORDER — AMOXICILLIN-POT CLAVULANATE 875-125 MG PO TABS: 1.0000 | ORAL_TABLET | Freq: Two times a day (BID) | ORAL | 0 refills | Status: DC

## 2020-05-11 MED ORDER — AMOXICILLIN-POT CLAVULANATE 875-125 MG PO TABS
1.0000 | ORAL_TABLET | Freq: Once | ORAL | Status: AC
  Administered 2020-05-11: 1 via ORAL
  Filled 2020-05-11: qty 1

## 2020-05-11 NOTE — ED Notes (Signed)
DC instructions reviewed with pt. PT verbalized understanding. PT DC °

## 2020-05-11 NOTE — Progress Notes (Signed)
Acute office Visit     This visit occurred during the SARS-CoV-2 public health emergency.  Safety protocols were in place, including screening questions prior to the visit, additional usage of staff PPE, and extensive cleaning of exam room while observing appropriate contact time as indicated for disinfecting solutions.    CC/Reason for Visit: "Trouble with my rectum"  HPI: Beverly Ballard is a 75 y.o. female who is coming in today for the above mentioned reasons.  She is here today for an acute office visit.  Her main reason for evaluation is hematochezia.  She tells me that for about a week prior to last night she has been having constipation.  She had no bowel movement in over a week.  Yesterday she started having significant abdominal cramping and has noticed significant amounts of bright red blood per rectum since then.  About 6-8 events.  She describes blood as copious amounts that stained the toilet bowl.  2 days prior she had a fall at home.  She states that she has been dizzy, hot, nauseated and sweaty and felt like she might pass out this morning.  She appears visibly short of breath and tachypneic.  Her skin appears very pale.  She admits to being tired.  Throughout the course of my discussion with her today, she appears to be a little confused and repeats issues that we have already addressed (I have never met her before so I am unable to ascertain her baseline level of cognition).  She is on aspirin 81 mg daily but no other blood thinners.  She is fully vaccinated against COVID.  She had a colonoscopy in 2018 that was reported as normal, no mention of hemorrhoids or diverticulosis.  She has been noted to be coughing intermittently.  No recent courses of antibiotics.   Past Medical/Surgical History: Past Medical History:  Diagnosis Date  . Allergy   . Anxiety and depression 12/19/2009  . Arthritis   . Cancer (HCC)    vaginal  . Constipation   . Degenerative disorder of eye   .  Diverticulosis   . Essential hypertension, benign 06/26/2009  . GERD 05/27/2007   takes Nexium daily  . Heart murmur    yrs ago  . History of gout   . History of migraine many yrs ago  . Hyperlipidemia    takes Pravastatin daily  . Hypothyroidism   . Insomnia    but doesn't take any meds  . LOW BACK PAIN 05/27/2007  . Numbness    left toes   . Osteoporosis   . Seizures ([place])    last 1983, none since then- last seizure 1988 per pt     Past Surgical History:  Procedure Laterality Date  . ADENOIDECTOMY    . BACK SURGERY     lumbar x2  . BRONCHIAL BIOPSY  09/12/2019   Procedure: BRONCHIAL BIOPSIES;  Surgeon: Laurin Coder, MD;  Location: [facility];  Service: Endoscopy;;  . BRONCHIAL WASHINGS  09/12/2019   Procedure: BRONCHIAL WASHINGS;  Surgeon: Laurin Coder, MD;  Location: [facility];  Service: Endoscopy;;  . COLONOSCOPY  2009  . ESOPHAGOGASTRODUODENOSCOPY    . Heel spurs Bilateral   . HEMOSTASIS CONTROL  09/12/2019   Procedure: HEMOSTASIS CONTROL;  Surgeon: Laurin Coder, MD;  Location: [facility];  Service: Endoscopy;;  epi  . KNEE ARTHROPLASTY Left 11/07/2013   Procedure: COMPUTER ASSISTED TOTAL KNEE ARTHROPLASTY;  Surgeon: Marybelle Killings, MD;  Location: [facility];  Service: Orthopedics;  Laterality: Left;  Left Total Knee Arthroplasty, Cemented, Computer Assist  . TONSILLECTOMY    . TOTAL KNEE REVISION Left 09/19/2014   Procedure: LEFT TOTAL KNEE REVISION;  Surgeon: Leandrew Koyanagi, MD;  Location: [facility];  Service: Orthopedics;  Laterality: Left;  . UPPER GASTROINTESTINAL ENDOSCOPY    . VIDEO BRONCHOSCOPY N/A 09/12/2019   Procedure: VIDEO BRONCHOSCOPY WITH FLUORO;  Surgeon: Laurin Coder, MD;  Location: [facility];  Service: Endoscopy;  Laterality: N/A;    Social History:  reports that she quit smoking about 41 years ago. Her smoking use included cigarettes. She has a 5.00 pack-year smoking history. She has never used smokeless tobacco. She reports that she does  not drink alcohol and does not use drugs.  Allergies: Allergies  Allergen Reactions  . Lamotrigine Swelling    Tongue swelling  . Bupropion Other (See Comments)    not known  . Carbamazepine Other (See Comments)    Hypnatremia  . Levetiracetam     Other reaction(s): Dizziness (intolerance)  . Tramadol Other (See Comments)    not known  . Adhesive [Tape]     ?  Blister after knee surgery  . Clarithromycin     Unsure of reaction    . Doxycycline     Interfered with seizure medication  . Nickel     Had to remove knee implant with nickel and replace it  . Other     Metal and perfumes  . Topamax [Topiramate] Nausea And Vomiting  . Estradiol Rash  . Latex Rash  . Phenytoin Sodium Extended Other (See Comments)    drowsiness    Family History:  Family History  Problem Relation Age of Onset  . Aortic dissection Father   . Arthritis Mother   . Stroke Mother 16  . Heart murmur Other   . Throat cancer Sister   . Colon cancer Neg Hx   . Esophageal cancer Neg Hx   . Stomach cancer Neg Hx   . Rectal cancer Neg Hx   . Colon polyps Neg Hx      Current Outpatient Medications:  .  albuterol (VENTOLIN HFA) 108 (90 Base) MCG/ACT inhaler, INHALE 2 PUFFS INTO THE LUNGS EVERY 4 HOURS AS NEEDED FOR WHEEZING OR SHORTNESS OF BREATH., Disp: 18 each, Rfl: 0 .  aspirin 81 MG tablet, Take 81 mg by mouth daily., Disp: , Rfl:  .  b complex vitamins tablet, Take 1 tablet by mouth daily., Disp: , Rfl:  .  beta carotene w/minerals (OCUVITE) tablet, Take 1 tablet by mouth daily., Disp: , Rfl:  .  Cholecalciferol (VITAMIN D3 PO), Take by mouth., Disp: , Rfl:  .  cyclobenzaprine (FLEXERIL) 10 MG tablet, Take 1 tablet (10 mg total) by mouth 2 (two) times daily as needed for muscle spasms., Disp: 30 tablet, Rfl: 0 .  EPINEPHrine 0.3 mg/0.3 mL IJ SOAJ injection, SMARTSIG:Injection As Directed, Disp: , Rfl:  .  esomeprazole (NEXIUM) 20 MG capsule, Take 2 capsules (40 mg total) by mouth daily before  breakfast. (Patient taking differently: Take 20 mg by mouth daily before breakfast.), Disp: 2 capsule, Rfl: 0 .  furosemide (LASIX) 20 MG tablet, TAKE 1 TABLET BY MOUTH IN THE MORNING, Disp: 30 tablet, Rfl: 1 .  levocetirizine (XYZAL) 5 MG tablet, Take 5 mg by mouth daily. , Disp: , Rfl:  .  levothyroxine (SYNTHROID) 88 MCG tablet, Take 1 tablet (88 mcg total) by mouth daily., Disp: 90 tablet, Rfl: 3 .  lisinopril (ZESTRIL) 5 MG tablet, TAKE 1 TABLET BY MOUTH EVERY DAY, Disp: 90 tablet, Rfl: 1 .  LORazepam (ATIVAN) 0.5 MG tablet, Take 1 tablet (0.5 mg total) by mouth 4 (four) times daily as needed for anxiety., Disp: 60 tablet, Rfl: 0 .  methocarbamol (ROBAXIN) 500 MG tablet, Take 1 tablet (500 mg total) by mouth 2 (two) times daily as needed., Disp: 20 tablet, Rfl: 0 .  ondansetron (ZOFRAN ODT) 4 MG disintegrating tablet, Take 1 tablet (4 mg total) by mouth every 6 (six) hours as needed for nausea or vomiting., Disp: 30 tablet, Rfl: 3 .  pravastatin (PRAVACHOL) 20 MG tablet, TAKE 1 TABLET (20 MG TOTAL) BY MOUTH DAILY. TAKE 1 TABLET BY MOUTH EVERY DAY, Disp: 90 tablet, Rfl: 1 .  sertraline (ZOLOFT) 100 MG tablet, TAKE 2 TABLETS BY MOUTH EVERY DAY, Disp: 180 tablet, Rfl: 0 .  Spacer/Aero-Holding Chambers (AEROCHAMBER PLUS) inhaler, Use as instructed, Disp: 1 each, Rfl: 0 .  Spacer/Aero-Holding Chambers DEVI, Use as directed with inhaler, Disp: 1 each, Rfl: 0 .  zonisamide (ZONEGRAN) 100 MG capsule, Take 2 capsules twice a day, Disp: 360 capsule, Rfl: 3 .  oxyCODONE-acetaminophen (PERCOCET) 5-325 MG tablet, Take 1 tablet by mouth every 6 (six) hours as needed for severe pain., Disp: 12 tablet, Rfl: 0  Review of Systems:  Constitutional: Denies fever, chills, diaphoresis.  HEENT: Denies photophobia, eye pain, redness, hearing loss, ear pain, congestion, sore throat, rhinorrhea, sneezing, mouth sores, trouble swallowing, neck pain, neck stiffness and tinnitus.   Respiratory: Denies  chest  tightness. Cardiovascular: Denies chest pain, palpitations and leg swelling.  Gastrointestinal: Denies nausea, vomiting and abdominal distention.  Genitourinary: Denies dysuria, urgency, frequency, hematuria, flank pain and difficulty urinating.  Endocrine: Denies: hot or cold intolerance, sweats, changes in hair or nails, polyuria, polydipsia. Musculoskeletal: Denies myalgias, back pain, joint swelling, arthralgias and gait problem.  Skin: Denies pallor, rash and wound.  Neurological: Deniesmseizures,  numbness and headaches.  Hematological: Denies adenopathy. Easy bruising, personal or family bleeding history  Psychiatric/Behavioral: Denies suicidal ideation, mood changes, confusion, nervousness, sleep disturbance and agitation    Physical Exam: Vitals:   05/11/20 1141  BP: 120/78  Pulse: 90  Temp: (!) 97.5 F (36.4 C)  TempSrc: Oral  SpO2: 97%  Weight: 244 lb 11.2 oz (111 kg)    Body mass index is 40.72 kg/m.   Constitutional: Appears tachypneic, confused, skin is ashen gray Eyes: PERRL, lids and conjunctivae normal ENMT: Mucous membranes are dry.   Respiratory: Bilateral crackles  Cardiovascular: Regular rate and rhythm, no murmurs / rubs / gallops. Abdomen: Positive diffuse tenderness, positive bowel sounds, no masses palpated. Musculoskeletal: no clubbing / cyanosis. No joint deformity upper and lower extremities. Good ROM, no contractures. Normal muscle tone.  Psychiatric: Normal judgment and insight. Alert and oriented x 3.  Appears a little confused, has asked the same question a few times, has difficulty recalling the exact timeline of her symptoms.  Impression and Plan:  Hematochezia  SOB (shortness of breath)  Postural dizziness with presyncope  Fatigue, unspecified type  Myalgia  -With her hematochezia, abdominal pain, shortness of breath and tachypnea, crackles on lung auscultation and ashy gray skin, I believe it is important that she be examined today  in the emergency department. -Concerns would be significant acute blood loss anemia from GI bleeding.  Etiology of the GI bleed is a little more perplexing with lack of mention of diverticulosis or hemorrhoids on her colonoscopy in 2018. -With her recent  presyncopal events, her mild confusion and her fall, I wonder about significant intravascular volume depletion. -Even though she is fully vaccinated against COVID with her fatigue, body aches, cough and diarrhea I wonder if she might have COVID. -With findings on lung auscultation I believe it is important that she get a chest x-ray to rule out pneumonia. -She has not had any recent antibiotic use to raise concern for C. difficile, although certainly any intra-abdominal infectious pathology is possible. -In any case, emergent evaluation in the ED appears to be necessary given her current presentation today.  Discussed with husband who will drive her there today.  Time spent: 45 minutes examining patient and formulating plan of care       Isaac Bliss, MD [facility]

## 2020-05-11 NOTE — ED Provider Notes (Signed)
[facility] Provider Note   CSN: [id number] Arrival date & time: 05/11/20  1228     History Chief Complaint  Patient presents with  . GI Bleeding    Beverly Ballard is a 75 y.o. female with PMH/o GERD, HTN, HLD who presents for evaluation of bright red blood per rectum that has been ongoing for 2 days.  She reports she has about 8 episodes in the last 2 days.  She has also had some lower abdominal tenderness.  She states that she will constantly have some abdominal pain normally but states that this has been worse in the last 2 days.  She states that she has not had any vomiting.  She had some some nausea but that is typical for her.  She went her primary care doctor today for evaluation.  While there, they noted that she was short of breath and tachypneic.  Patient reports that she has been short of breath "for a while" and that she has issues with her lungs.  She states that she has been on antibiotics, prednisone.  Her last dose was a couple months ago.  She follows with Armbruster (GI).  Her last colonoscopy was in 2018 and was normal.  She is on baby aspirin.  No other blood thinners.  She has not noted any fevers, dysuria, hematuria.  She reports that she has occasionally had some mild chest pain over the last few weeks.  No cough.  She has had her COVID-vaccine x3.  The history is provided by the patient.       Past Medical History:  Diagnosis Date  . Allergy   . Anxiety and depression 12/19/2009  . Arthritis   . Cancer (HCC)    vaginal  . Constipation   . Degenerative disorder of eye   . Diverticulosis   . Essential hypertension, benign 06/26/2009  . GERD 05/27/2007   takes Nexium daily  . Heart murmur    yrs ago  . History of gout   . History of migraine many yrs ago  . Hyperlipidemia    takes Pravastatin daily  . Hypothyroidism   . Insomnia    but doesn't take any meds  . LOW BACK PAIN 05/27/2007  . Numbness    left toes   .  Osteoporosis   . Seizures ([place])    last 1983, none since then- last seizure 1988 per pt     Patient Active Problem List   Diagnosis Date Noted  . Ankle edema, bilateral 10/31/2019  . Excessive daytime sleepiness 06/28/2019  . Shortness of breath 06/28/2019  . Healthcare maintenance 06/28/2019  . ANA positive 06/28/2019  . Bronchiectasis ([place]) 06/28/2019  . Depression, major, single episode, moderate ([place]) 11/19/2018  . Fatigue 03/23/2018  . Recurrent major depressive disorder, in partial remission ([place]) 05/04/2017  . Left knee pain 01/12/2017  . Ovarian cyst 10/30/2016  . Aortic atherosclerosis ([place]) 11/19/2015  . Hyperlipemia 04/03/2015  . S/P revision of total knee 09/19/2014  . Seizure disorder ([place]) 01/24/2014  . Obesity, BMI unknown 01/24/2014  . Osteopenia, stable on dexa 2010 - followed by gyn 06/15/2012  . Pap smear abnormality of cervix/human papillomavirus (HPV) positive - 09/2011, followed by gyn 06/15/2012  . Essential hypertension 07/03/2009  . Hypothyroidism 05/27/2007  . Allergic rhinitis 05/27/2007  . GERD 05/27/2007    Past Surgical History:  Procedure Laterality Date  . ADENOIDECTOMY    . BACK SURGERY     lumbar x2  .  BRONCHIAL BIOPSY  09/12/2019   Procedure: BRONCHIAL BIOPSIES;  Surgeon: Laurin Coder, MD;  Location: [facility];  Service: Endoscopy;;  . BRONCHIAL WASHINGS  09/12/2019   Procedure: BRONCHIAL WASHINGS;  Surgeon: Laurin Coder, MD;  Location: [facility];  Service: Endoscopy;;  . COLONOSCOPY  2009  . ESOPHAGOGASTRODUODENOSCOPY    . Heel spurs Bilateral   . HEMOSTASIS CONTROL  09/12/2019   Procedure: HEMOSTASIS CONTROL;  Surgeon: Laurin Coder, MD;  Location: [facility];  Service: Endoscopy;;  epi  . KNEE ARTHROPLASTY Left 11/07/2013   Procedure: COMPUTER ASSISTED TOTAL KNEE ARTHROPLASTY;  Surgeon: Marybelle Killings, MD;  Location: [facility];  Service: Orthopedics;  Laterality: Left;  Left Total Knee Arthroplasty, Cemented, Computer  Assist  . TONSILLECTOMY    . TOTAL KNEE REVISION Left 09/19/2014   Procedure: LEFT TOTAL KNEE REVISION;  Surgeon: Leandrew Koyanagi, MD;  Location: [facility];  Service: Orthopedics;  Laterality: Left;  . UPPER GASTROINTESTINAL ENDOSCOPY    . VIDEO BRONCHOSCOPY N/A 09/12/2019   Procedure: VIDEO BRONCHOSCOPY WITH FLUORO;  Surgeon: Laurin Coder, MD;  Location: [facility];  Service: Endoscopy;  Laterality: N/A;     OB History   No obstetric history on file.     Family History  Problem Relation Age of Onset  . Aortic dissection Father   . Arthritis Mother   . Stroke Mother 15  . Heart murmur Other   . Throat cancer Sister   . Colon cancer Neg Hx   . Esophageal cancer Neg Hx   . Stomach cancer Neg Hx   . Rectal cancer Neg Hx   . Colon polyps Neg Hx     Social History   Tobacco Use  . Smoking status: Former Smoker    Packs/day: 0.25    Years: 20.00    Pack years: 5.00    Types: Cigarettes    Quit date: 09/08/1978    Years since quitting: 41.7  . Smokeless tobacco: Never Used  . Tobacco comment: quit smoking 28yrs ago  Vaping Use  . Vaping Use: Never used  Substance Use Topics  . Alcohol use: No  . Drug use: No    Home Medications Prior to Admission medications   Medication Sig Start Date End Date Taking? Authorizing Provider  albuterol (VENTOLIN HFA) 108 (90 Base) MCG/ACT inhaler Inhale 1-2 puffs into the lungs every 6 (six) hours as needed for wheezing or shortness of breath. 05/11/20  Yes Providence Lanius A, PA-C  amoxicillin-clavulanate (AUGMENTIN) 875-125 MG tablet Take 1 tablet by mouth every 12 (twelve) hours. 05/11/20  Yes Volanda Napoleon, PA-C  HYDROcodone-acetaminophen (NORCO/VICODIN) 5-325 MG tablet Take 1-2 tablets by mouth every 6 (six) hours as needed. 05/11/20  Yes Volanda Napoleon, PA-C  aspirin 81 MG tablet Take 81 mg by mouth daily.    [provider]  b complex vitamins tablet Take 1 tablet by mouth daily.    [provider]  beta  carotene w/minerals (OCUVITE) tablet Take 1 tablet by mouth daily.    [provider]  Cholecalciferol (VITAMIN D3 PO) Take by mouth.    [provider]  cyclobenzaprine (FLEXERIL) 10 MG tablet Take 1 tablet (10 mg total) by mouth 2 (two) times daily as needed for muscle spasms. 06/03/19   Aundra Dubin, PA-C  EPINEPHrine 0.3 mg/0.3 mL IJ SOAJ injection SMARTSIG:Injection As Directed 09/14/19   [provider]  esomeprazole (NEXIUM) 20 MG capsule Take 2 capsules (40 mg total) by mouth  daily before breakfast. Patient taking differently: Take 20 mg by mouth daily before breakfast. 03/21/19   Laurey Morale, MD  furosemide (LASIX) 20 MG tablet TAKE 1 TABLET BY MOUTH IN THE MORNING 01/23/20   Mannam, Praveen, MD  levocetirizine (XYZAL) 5 MG tablet Take 5 mg by mouth daily.  03/05/16   [provider]  levothyroxine (SYNTHROID) 88 MCG tablet Take 1 tablet (88 mcg total) by mouth daily. 07/04/19   Philemon Kingdom, MD  lisinopril (ZESTRIL) 5 MG tablet TAKE 1 TABLET BY MOUTH EVERY DAY 04/23/20   Koberlein, Steele Berg, MD  LORazepam (ATIVAN) 0.5 MG tablet Take 1 tablet (0.5 mg total) by mouth 4 (four) times daily as needed for anxiety. 12/05/19   Addison Lank, PA-C  methocarbamol (ROBAXIN) 500 MG tablet Take 1 tablet (500 mg total) by mouth 2 (two) times daily as needed. 01/27/20   Aundra Dubin, PA-C  ondansetron (ZOFRAN ODT) 4 MG disintegrating tablet Take 1 tablet (4 mg total) by mouth every 6 (six) hours as needed for nausea or vomiting. 03/30/19   Caren Macadam, MD  oxyCODONE-acetaminophen (PERCOCET) 5-325 MG tablet Take 1 tablet by mouth every 6 (six) hours as needed for severe pain. 03/05/20   Burchette, Alinda Sierras, MD  pravastatin (PRAVACHOL) 20 MG tablet TAKE 1 TABLET (20 MG TOTAL) BY MOUTH DAILY. TAKE 1 TABLET BY MOUTH EVERY DAY 04/23/20   Caren Macadam, MD  sertraline (ZOLOFT) 100 MG tablet TAKE 2 TABLETS BY MOUTH EVERY DAY 03/27/20   Donnal Moat T, PA-C   Spacer/Aero-Holding Chambers (AEROCHAMBER PLUS) inhaler Use as instructed 03/19/20   Caren Macadam, MD  Spacer/Aero-Holding Dorise Bullion Use as directed with inhaler 03/19/20   Caren Macadam, MD  zonisamide (ZONEGRAN) 100 MG capsule Take 2 capsules twice a day 02/14/20   Cameron Sprang, MD    Allergies    Lamotrigine, Bupropion, Carbamazepine, Levetiracetam, Tramadol, Adhesive [tape], Clarithromycin, Doxycycline, Nickel, Other, Topamax [topiramate], Estradiol, Latex, and Phenytoin sodium extended  Review of Systems   Review of Systems  Constitutional: Negative for fever.  Respiratory: Positive for shortness of breath. Negative for cough.   Cardiovascular: Negative for chest pain.  Gastrointestinal: Positive for abdominal pain and blood in stool. Negative for nausea and vomiting.  Genitourinary: Negative for dysuria and hematuria.  Neurological: Negative for headaches.  All other systems reviewed and are negative.   Physical Exam Updated Vital Signs BP (!) 125/52   Pulse 73   Temp 97.6 F (36.4 C) (Oral)   Resp 16   Ht 5\' 5"  (1.651 m)   Wt 110.7 kg   SpO2 96%   BMI 40.60 kg/m   Physical Exam Vitals and nursing note reviewed.  Constitutional:      Appearance: Normal appearance. She is well-developed and well-nourished.  HENT:     Head: Normocephalic and atraumatic.     Mouth/Throat:     Mouth: Oropharynx is clear and moist and mucous membranes are normal.  Eyes:     General: Lids are normal.     Extraocular Movements: EOM normal.     Conjunctiva/sclera: Conjunctivae normal.     Pupils: Pupils are equal, round, and reactive to light.  Cardiovascular:     Rate and Rhythm: Normal rate and regular rhythm.     Pulses: Normal pulses.     Heart sounds: Normal heart sounds. No murmur heard. No friction rub. No gallop.   Pulmonary:     Effort: Tachypnea present.  Breath sounds: Normal breath sounds.     Comments: Tachypnea and increased work of breathing  noted. No obvious wheezing noted. No evidence of respiratory distress.  Abdominal:     Palpations: Abdomen is soft. Abdomen is not rigid.     Tenderness: There is abdominal tenderness in the right lower quadrant, suprapubic area and left lower quadrant. There is no guarding.     Comments: Tenderness palpation diffusely noted to the lower abdomen.  No rigidity.  No CVA tenderness noted bilaterally.  Genitourinary:    Comments: The exam was performed with a chaperone present Marya Amsler, Therapist, sports).  Small external hemorrhoid noted at the 6:00 region that is nonthrombosed.  Digital rectal exam shows bright red blood.  No dark melena. Musculoskeletal:        General: Normal range of motion.     Cervical back: Full passive range of motion without pain.  Skin:    General: Skin is warm and dry.     Capillary Refill: Capillary refill takes less than 2 seconds.  Neurological:     Mental Status: She is alert and oriented to person, place, and time.  Psychiatric:        Mood and Affect: Mood and affect normal.        Speech: Speech normal.     ED Results / Procedures / Treatments   Labs (all labs ordered are listed, but only abnormal results are displayed) Labs Reviewed  COMPREHENSIVE METABOLIC PANEL - Abnormal; Notable for the following components:      Result Value   Glucose, Bld 135 (*)    Creatinine, Ser 1.01 (*)    GFR, Estimated 59 (*)    All other components within normal limits  CBC - Abnormal; Notable for the following components:   WBC 15.7 (*)    nRBC 0.3 (*)    All other components within normal limits  BRAIN NATRIURETIC PEPTIDE - Abnormal; Notable for the following components:   B Natriuretic Peptide 177.7 (*)    All other components within normal limits  POC OCCULT BLOOD, ED - Abnormal; Notable for the following components:   Fecal Occult Bld POSITIVE (*)    All other components within normal limits  TYPE AND SCREEN  TROPONIN I (HIGH SENSITIVITY)  TROPONIN I (HIGH SENSITIVITY)     EKG EKG Interpretation  Date/Time:  Friday May 11 2020 12:33:58 EST Ventricular Rate:  98 PR Interval:  186 QRS Duration: 66 QT Interval:  342 QTC Calculation: 436 R Axis:   -16 Text Interpretation: Normal sinus rhythm Inferior infarct , age undetermined Confirmed by Dene Gentry [phone]) on 05/11/2020 1:12:48 PM   Radiology CT ABDOMEN PELVIS W CONTRAST  Result Date: 05/11/2020 CLINICAL DATA:  Lower abdominal pain with bright red blood per rectum EXAM: CT ABDOMEN AND PELVIS WITH CONTRAST TECHNIQUE: Multidetector CT imaging of the abdomen and pelvis was performed using the standard protocol following bolus administration of intravenous contrast. CONTRAST:  162mL OMNIPAQUE IOHEXOL 300 MG/ML  SOLN COMPARISON:  10/14/2016 FINDINGS: Lower chest: Emphysematous changes are noted. Mild areas of scarring are seen. No focal infiltrate or sizable effusion is noted. Hepatobiliary: No focal liver abnormality is seen. No gallstones, gallbladder wall thickening, or biliary dilatation. Pancreas: Unremarkable. No pancreatic ductal dilatation or surrounding inflammatory changes. Spleen: Normal in size without focal abnormality. Adrenals/Urinary Tract: Adrenal glands are within normal limits. Kidneys demonstrate no renal calculi or obstructive changes. The ureters are within normal limits. Bladder is partially distended. Stomach/Bowel: Diverticular changes noted with evidence of  wall thickening and pericolonic inflammatory change in the descending colon and proximal sigmoid consistent with focal colitis. More proximal colon is within normal limits. The appendix is not well visualized and may have been surgically removed. Small bowel and stomach appear within normal limits. Vascular/Lymphatic: Aortic atherosclerosis. Stable infrarenal aortic dissection is noted without aneurysmal dilatation. No enlarged abdominal or pelvic lymph nodes. Reproductive: Uterus is within normal limits. Left ovary is unremarkable.  Small simple appearing cyst is noted within the right ovary measuring 2.3 cm. This is stable in appearance from the prior exam. Other: No abdominal wall hernia or abnormality. No abdominopelvic ascites. Musculoskeletal: Postsurgical changes are noted in the lower lumbar spine. No acute bony abnormality is noted. IMPRESSION: Changes consistent with focal colitis in the descending colon. This is likely inflammatory in nature. No perforation or abscess is seen. Stable right ovarian simple cyst. No follow-up imaging recommended. Note: This recommendation does not apply to premenarchal patients and to those with increased risk (genetic, family history, elevated tumor markers or other high-risk factors) of ovarian cancer. Reference: JACR 2020 Feb; 17(2):248-254 Scarring in the bases bilaterally with emphysematous changes. Emphysema (ICD10-J43.9). Electronically Signed   By: Inez Catalina M.D.   On: 05/11/2020 16:34   DG Chest Portable 1 View  Result Date: 05/11/2020 CLINICAL DATA:  Shortness of breath, weakness EXAM: PORTABLE CHEST 1 VIEW COMPARISON:  March 05, 2020 FINDINGS: Trachea midline. Cardiomediastinal contours and hilar structures are stable compared to prior imaging. Lungs are clear.  RIGHT hemidiaphragm remains elevated. Leads project over the chest limiting assessment particularly in the RIGHT lower lobe. On limited assessment no acute skeletal process. IMPRESSION: No active disease. Limited assessment of the RIGHT lower lobe due to overlying leads. Electronically Signed   By: Zetta Bills M.D.   On: 05/11/2020 14:25    Procedures Procedures (including critical care time)  Medications Ordered in ED Medications  HYDROcodone-acetaminophen (NORCO) 7.5-325 MG per tablet 1 tablet (1 tablet Oral Given 05/11/20 1836)  ondansetron (ZOFRAN) injection 4 mg (4 mg Intravenous Given 05/11/20 1600)  morphine 2 MG/ML injection 2 mg (2 mg Intravenous Given 05/11/20 1601)  iohexol (OMNIPAQUE) 300 MG/ML solution  100 mL (100 mLs Intravenous Contrast Given 05/11/20 1623)  amoxicillin-clavulanate (AUGMENTIN) 875-125 MG per tablet 1 tablet (1 tablet Oral Given 05/11/20 1836)    ED Course  I have reviewed the triage vital signs and the nursing notes.  Pertinent labs & imaging results that were available during my care of the patient were reviewed by me and considered in my medical decision making (see chart for details).    MDM Rules/Calculators/A&P                          75 year old female brought in for evaluation of bright red blood per rectum x2 days.  Associate with abdominal pain.  History of shortness of breath as well.  Feels like it is gotten recently worse.  On initial arrival, she is afebrile, slightly hypertensive and tachypneic.  She does have some increased work of breathing.  No obvious wheezing noted on exam.  Does not appear significantly fluid overloaded.  On abdominal exam, she is tender in the lower abdomen.  On digital rectal exam, she has bright red blood.  Concern for GI bleed/diverticulitis.  Plan to check labs, imaging.  We will also evaluate her shortness of breath.  CBC shows leukocytosis of 15.7. Hgb stable at 13.9.  CMP shows normal BUN and creatinine.  Troponin is normal.  Fecal occult is positive.  BNP is 177.  Chest x-ray negative for any infectious etiology.  No evidence of frank pulmonary edema.  CT abd/pelvis shows focal colitis in descending colon likely inflammatory in nature.  No perforation or abscess seen.  There is a stable right ovarian cyst.  No further follow-up recommended.  Scarring in the bases of the lungs bilaterally with emphysematous changes.  I discussed findings with patient and her husband who is at bedside.  I discussed with her that the colitis is most likely causing the bright red blood that she is seeing.  There is no evidence of perforation or abscess.  At this time, she does have leukocytosis so I feel it is pertinent to treat her with antibiotics.   At this time, her hemoglobin is stable, she is hemodynamically stable.  I discussed options with patient and offered her admission for further management of her symptoms.  We also discussed trying oral pain medication antibiotics and monitoring at home.  After extensive discussion, patient opted to try oral antibiotics and be treated at home.  After discussing extensively with both patient and husband, they are comfortable with this plan.  I feel that this is reasonable given that her blood pressure is stable, her hemoglobin is stable.  I did again discuss with her the risk versus benefits of admission versus discharge home and after full discussion, patient does not wish to be admitted at this time.  We will give her short course of pain medication as well as start her on antibiotics.  Message sent to her GI doctor for close follow-up.  At this time, patient exhibits no emergent life-threatening condition that require further evaluation in ED. Patient had ample opportunity for questions and discussion. All patient's questions were answered with full understanding. Strict return precautions discussed. Patient expresses understanding and agreement to plan.   Portions of this note were generated with Lobby[employer]. Dictation errors may occur despite best attempts at proofreading.  Final Clinical Impression(s) / ED Diagnoses Final diagnoses:  Colitis    Rx / DC Orders ED Discharge Orders         Ordered    albuterol (VENTOLIN HFA) 108 (90 Base) MCG/ACT inhaler  Every 6 hours PRN        05/11/20 1754    amoxicillin-clavulanate (AUGMENTIN) 875-125 MG tablet  Every 12 hours        05/11/20 1754    HYDROcodone-acetaminophen (NORCO/VICODIN) 5-325 MG tablet  Every 6 hours PRN        05/11/20 1754           Desma Mcgregor 05/11/20 2041    Valarie Merino, MD 05/12/20 [phone]

## 2020-05-11 NOTE — ED Triage Notes (Signed)
Patient here with complaint of bright red blood in the toilet after each bowel movement starting yesterday. No anticoagulation.

## 2020-05-11 NOTE — Discharge Instructions (Addendum)
Use albuterol inhaler for breathing.  Take pain medications as directed for break through pain. Do not drive or operate machinery while taking this medication.   Take antibiotics as directed. Please take all of your antibiotics until finished.  As we discussed, you should participate in a clear liquid diet.  I have sent a message to Dr. Havery Moros. Please call their office to arrange for follow-up.  As we discussed, if your pain worsens, you have worsening bleeding, you feel lightheaded, dizzy or any other worsening or concerning symptoms, return to the emergency department immediately.

## 2020-05-14 ENCOUNTER — Other Ambulatory Visit: Payer: Medicare HMO

## 2020-05-14 ENCOUNTER — Telehealth: Payer: Self-pay

## 2020-05-14 DIAGNOSIS — R197 Diarrhea, unspecified: Secondary | ICD-10-CM

## 2020-05-14 DIAGNOSIS — J3089 Other allergic rhinitis: Secondary | ICD-10-CM | POA: Diagnosis not present

## 2020-05-14 DIAGNOSIS — J301 Allergic rhinitis due to pollen: Secondary | ICD-10-CM | POA: Diagnosis not present

## 2020-05-14 DIAGNOSIS — R194 Change in bowel habit: Secondary | ICD-10-CM

## 2020-05-14 DIAGNOSIS — A09 Infectious gastroenteritis and colitis, unspecified: Secondary | ICD-10-CM | POA: Diagnosis not present

## 2020-05-14 DIAGNOSIS — J3081 Allergic rhinitis due to animal (cat) (dog) hair and dander: Secondary | ICD-10-CM | POA: Diagnosis not present

## 2020-05-14 NOTE — Telephone Encounter (Signed)
Spoke with patient, she is aware that Dr. Armbruster would like for her to have a GI pathogen panel done, she is aware that the lab will give her a kit and instructions on how to collect the stool sample. Patient states that she will send her husband to come pick up the kit. Patient is aware that once we receive the results Dr. Armbruster will advise on any further recommendations.  Advised that we will see her for her appt tomorrow. Patient verbalized understanding and had no further concerns at the end of the call.   Lab order in epic.  

## 2020-05-14 NOTE — Telephone Encounter (Signed)
Okay, I will see her tomorrow. They gave her Augmentin which can cause diarrhea so not sure what is driving this process. I would like to order a GI pathogen panel to ensure okay given this presentation, if you can ask her to go to the lab. Thanks

## 2020-05-14 NOTE — Addendum Note (Signed)
Addended by: Yevette Edwards on: 05/14/2020 12:58 PM   Modules accepted: Orders

## 2020-05-14 NOTE — Telephone Encounter (Signed)
Spoke with patient, she states that she is constantly going to the bathroom with diarrhea, she reports that it is "pouring out of her", she does have abdominal pain but she states that she was prescribed pain medicine for that. Denies any fever. Patient reports that she has been on a clear liquid diet. Patient has been scheduled for a follow up tomorrow at 4 PM.

## 2020-05-14 NOTE — Telephone Encounter (Signed)
Beverly Ballard, Beverly Raspberry, MD  Yevette Edwards, RN Terilyn Sano this is one of my patient's who was seen in the ED this weekend, they sent me a message. Can you check up on her and see how she is doing? Not sure if I have any openings this week for a follow up appointment. If so, can you book her for a follow up? Thanks     Lm on vm for patient to return call.

## 2020-05-15 ENCOUNTER — Other Ambulatory Visit: Payer: Self-pay

## 2020-05-15 ENCOUNTER — Encounter: Payer: Self-pay | Admitting: Gastroenterology

## 2020-05-15 ENCOUNTER — Ambulatory Visit: Payer: Medicare HMO | Admitting: Gastroenterology

## 2020-05-15 VITALS — BP 142/74 | HR 85 | Ht 65.0 in | Wt 241.6 lb

## 2020-05-15 DIAGNOSIS — R1030 Lower abdominal pain, unspecified: Secondary | ICD-10-CM | POA: Diagnosis not present

## 2020-05-15 DIAGNOSIS — R110 Nausea: Secondary | ICD-10-CM | POA: Diagnosis not present

## 2020-05-15 DIAGNOSIS — R194 Change in bowel habit: Secondary | ICD-10-CM | POA: Diagnosis not present

## 2020-05-15 DIAGNOSIS — R933 Abnormal findings on diagnostic imaging of other parts of digestive tract: Secondary | ICD-10-CM | POA: Diagnosis not present

## 2020-05-15 MED ORDER — HYOSCYAMINE SULFATE 0.125 MG SL SUBL
0.1250 mg | SUBLINGUAL_TABLET | Freq: Four times a day (QID) | SUBLINGUAL | 1 refills | Status: DC | PRN
Start: 2020-05-15 — End: 2021-02-27

## 2020-05-15 NOTE — Progress Notes (Signed)
HPI :  75 year old female here for follow-up visit for abdominal pain and abnormal CT imaging.  I have seen her in the past for chronic nausea and abdominal pain.  She states for the past 3 weeks or so she had onset of some intermittent lower abdominal discomfort.  Initially was mild and she was "putting it off", and then became worse over the past few days.  She developed bright red blood in her stool along with some diarrhea and was seen in the emergency department on the 21st.  She had a CT scan done at that time which showed inflammation of her left colon as outlined below.  She had a leukocytosis with a white count of 15 and a normal hemoglobin.  She was given empiric Augmentin and some pain medication and sent home.  She is here to follow-up for that today.  She states she feels about 25% improved since she has been on the Augmentin, she does think that it is providing some benefit.  She is not having any further bright red blood per rectum.  She is having some continued loose stools, she is been about 5 times today already, upwards of 8-9 times a day.  Stools are loose and watery and contain what she thinks is some dark old blood.  Her pain is slightly better but has not resolved.  Can be mild and then experience sharp pains at times, sometimes it resolves completely.  She is never really had anything like this before that she can recall.  She had called into the office will be scheduled this appointment and I asked her to submit a GI pathogen panel.  She is submitted that today and that is pending.  She has not been taking any Imodium or antidiarrheals.  No NSAIDs.  She is tolerating p.o. but does not have much of an appetite.  She has chronic nausea that has persisted.  She has had fairly extensive evaluation for chronic nausea in the past.  She has been given Phenergan and Zofran, neither of which really help her much.  She changed her neurologist for management of her seizure disorder, stopped  her zonisamide for period of time but she states that did not help her nausea at all and so she resumed it.  She otherwise is not taking much of her pain medication and vitals appear stable.  No fevers.  She had a CT scan in 2018, other than an ovarian cyst for which follow up imaging was recommended.  She had a pelvic ultrasound in June 2020 which showed the cyst was smaller in size than previous.  She has had an endoscopy in 2017 for her symptoms, she did not have any clear cause for her symptoms at that time.  Colonoscopy in 2018 did not show any concerning pathology.  She had a gastric emptying study in 2017 which was normal as well as a MRI of the brain.  Most recently she had an endoscopy with me last year in January with empiric dilation of her esophagus.  No concerning findings noted.   Prior endoscopic evaluation: Colonoscopy 02/02/2017 - The examined portion of the ileum was normal. - Diverticulosis in the sigmoid colon. - Anal papilla(e) were hypertrophied. - The examination was otherwise normal. - No polyps  CT abdomen / pelvis 10/14/2016 - no acute pathology. Small R ovarian cyst. Pelvic US obtained which showed stable ovarian cyst, recommend surveillance in one year per radiology.   Colonoscopy 07/09/2007 - normal  EGD was  done 05/07/15 for chronic nausea and for dysphagia. I did not see any evidence of pathology noted to cause dysphagia. Otherwise she didn't have any clear pathology to cause nausea on her exam. She had some benign fundic gland polyps and biopsies of the stomach showed no evidence for H pylori.  GES 11/29/2015 - normal   MRI brain 12/07/2015 - normal, incidental 7 mm Rathke's cleft cyst.   EGD 05/05/19  - A 1 cm hiatal hernia was present. - The exam of the esophagus was otherwise normal. No obvious stenosis / stricture. - A guidewire was placed and the scope was withdrawn. Dilation was performed in the entire esophagus with a Savary dilator with mild  resistance at 17 mm and 18 mm. [place] endoscopy showed a ? small wrent in the upper esophagus below the UES. Biopsies were taken with a cold forceps in the upper third of the esophagus, in the middle third of the esophagus and in the lower third of the esophagus for histology. - The entire examined stomach was normal. Biopsies were taken with a cold forceps for Helicobacter pylori testing. - The duodenal bulb and second portion of the duodenum were normal.  1. Surgical [P], random gastric sites - MILD REACTIVE GASTROPATHY. H[place] Dyer IS NEGATIVE FOR HELICOBACTER PYLORI. - NO INTESTINAL METAPLASIA, DYSPLASIA, OR MALIGNANCY. 2. Surgical [P], random esophageal sites - BENIGN SQUAMOUS MUCOSA. - NO INCREASE IN INTRAEPITHELIAL EOSINOPHILS. - NO INTESTINAL METAPLASIA, DYSPLASIA, OR MALIGNANCY.   CT scan 05/11/20 - IMPRESSION: Changes consistent with focal colitis in the descending colon. This is likely inflammatory in nature. No perforation or abscess is seen.  Stable right ovarian simple cyst. No follow-up imaging recommended. Note: This recommendation does not apply to premenarchal patients and to those with increased risk (genetic, family history, elevated tumor markers or other high-risk factors) of ovarian cancer. Reference: JACR 2020 Feb; 17(2):248-254  Scarring in the bases bilaterally with emphysematous changes. Emphysema (ICD10-J43.9).   Past Medical History:  Diagnosis Date  . Allergy   . Anxiety and depression 12/19/2009  . Arthritis   . Cancer (HCC)    vaginal  . Constipation   . Degenerative disorder of eye   . Diverticulosis   . Essential hypertension, benign 06/26/2009  . GERD 05/27/2007   takes Nexium daily  . Heart murmur    yrs ago  . History of gout   . History of migraine many yrs ago  . Hyperlipidemia    takes Pravastatin daily  . Hypothyroidism   . Insomnia    but doesn't take any meds  . LOW BACK PAIN 05/27/2007  . Numbness    left toes   .  Osteoporosis   . Seizures ([place])    last 1983, none since then- last seizure 1988 per pt      Past Surgical History:  Procedure Laterality Date  . ADENOIDECTOMY    . BACK SURGERY     lumbar x2  . BRONCHIAL BIOPSY  09/12/2019   Procedure: BRONCHIAL BIOPSIES;  Surgeon: Laurin Coder, MD;  Location: [facility];  Service: Endoscopy;;  . BRONCHIAL WASHINGS  09/12/2019   Procedure: BRONCHIAL WASHINGS;  Surgeon: Laurin Coder, MD;  Location: [facility];  Service: Endoscopy;;  . COLONOSCOPY  2009  . ESOPHAGOGASTRODUODENOSCOPY    . Heel spurs Bilateral   . HEMOSTASIS CONTROL  09/12/2019   Procedure: HEMOSTASIS CONTROL;  Surgeon: Laurin Coder, MD;  Location: [facility];  Service: Endoscopy;;  epi  . KNEE ARTHROPLASTY Left 11/07/2013  Procedure: COMPUTER ASSISTED TOTAL KNEE ARTHROPLASTY;  Surgeon: Marybelle Killings, MD;  Location: [facility];  Service: Orthopedics;  Laterality: Left;  Left Total Knee Arthroplasty, Cemented, Computer Assist  . TONSILLECTOMY    . TOTAL KNEE REVISION Left 09/19/2014   Procedure: LEFT TOTAL KNEE REVISION;  Surgeon: Leandrew Koyanagi, MD;  Location: [facility];  Service: Orthopedics;  Laterality: Left;  . UPPER GASTROINTESTINAL ENDOSCOPY    . VIDEO BRONCHOSCOPY N/A 09/12/2019   Procedure: VIDEO BRONCHOSCOPY WITH FLUORO;  Surgeon: Laurin Coder, MD;  Location: [facility];  Service: Endoscopy;  Laterality: N/A;   Family History  Problem Relation Age of Onset  . Aortic dissection Father   . Arthritis Mother   . Stroke Mother 57  . Heart murmur Other   . Throat cancer Sister   . Colon cancer Neg Hx   . Esophageal cancer Neg Hx   . Stomach cancer Neg Hx   . Rectal cancer Neg Hx   . Colon polyps Neg Hx    Social History   Tobacco Use  . Smoking status: Former Smoker    Packs/day: 0.25    Years: 20.00    Pack years: 5.00    Types: Cigarettes    Quit date: 09/08/1978    Years since quitting: 41.7  . Smokeless tobacco: Never Used  . Tobacco comment:  quit smoking 75yrs ago  Vaping Use  . Vaping Use: Never used  Substance Use Topics  . Alcohol use: No  . Drug use: No   Current Outpatient Medications  Medication Sig Dispense Refill  . albuterol (VENTOLIN HFA) 108 (90 Base) MCG/ACT inhaler Inhale 1-2 puffs into the lungs every 6 (six) hours as needed for wheezing or shortness of breath. 8 g 0  . amoxicillin-clavulanate (AUGMENTIN) 875-125 MG tablet Take 1 tablet by mouth every 12 (twelve) hours. 14 tablet 0  . aspirin 81 MG tablet Take 81 mg by mouth daily.    Marland Kitchen b complex vitamins tablet Take 1 tablet by mouth daily.    . beta carotene w/minerals (OCUVITE) tablet Take 1 tablet by mouth daily.    . Cholecalciferol (VITAMIN D3 PO) Take by mouth.    . cyclobenzaprine (FLEXERIL) 10 MG tablet Take 1 tablet (10 mg total) by mouth 2 (two) times daily as needed for muscle spasms. 30 tablet 0  . EPINEPHrine 0.3 mg/0.3 mL IJ SOAJ injection SMARTSIG:Injection As Directed    . esomeprazole (NEXIUM) 20 MG capsule Take 2 capsules (40 mg total) by mouth daily before breakfast. (Patient taking differently: Take 20 mg by mouth daily before breakfast.) 2 capsule 0  . furosemide (LASIX) 20 MG tablet TAKE 1 TABLET BY MOUTH IN THE MORNING 30 tablet 1  . HYDROcodone-acetaminophen (NORCO/VICODIN) 5-325 MG tablet Take 1-2 tablets by mouth every 6 (six) hours as needed. 10 tablet 0  . levocetirizine (XYZAL) 5 MG tablet Take 5 mg by mouth daily.     Marland Kitchen levothyroxine (SYNTHROID) 88 MCG tablet Take 1 tablet (88 mcg total) by mouth daily. 90 tablet 3  . lisinopril (ZESTRIL) 5 MG tablet TAKE 1 TABLET BY MOUTH EVERY DAY 90 tablet 1  . LORazepam (ATIVAN) 0.5 MG tablet Take 1 tablet (0.5 mg total) by mouth 4 (four) times daily as needed for anxiety. 60 tablet 0  . methocarbamol (ROBAXIN) 500 MG tablet Take 1 tablet (500 mg total) by mouth 2 (two) times daily as needed. 20 tablet 0  . ondansetron (ZOFRAN ODT) 4 MG disintegrating tablet Take 1 tablet (  4 mg total) by mouth  every 6 (six) hours as needed for nausea or vomiting. 30 tablet 3  . oxyCODONE-acetaminophen (PERCOCET) 5-325 MG tablet Take 1 tablet by mouth every 6 (six) hours as needed for severe pain. 12 tablet 0  . pravastatin (PRAVACHOL) 20 MG tablet TAKE 1 TABLET (20 MG TOTAL) BY MOUTH DAILY. TAKE 1 TABLET BY MOUTH EVERY DAY 90 tablet 1  . sertraline (ZOLOFT) 100 MG tablet TAKE 2 TABLETS BY MOUTH EVERY DAY 180 tablet 0  . Spacer/Aero-Holding Chambers (AEROCHAMBER PLUS) inhaler Use as instructed 1 each 0  . Spacer/Aero-Holding Dorise Bullion Use as directed with inhaler 1 each 0  . zonisamide (ZONEGRAN) 100 MG capsule Take 2 capsules twice a day 360 capsule 3   No current facility-administered medications for this visit.   Allergies  Allergen Reactions  . Lamotrigine Swelling    Tongue swelling  . Bupropion Other (See Comments)    not known  . Carbamazepine Other (See Comments)    Hypnatremia  . Levetiracetam     Other reaction(s): Dizziness (intolerance)  . Tramadol Other (See Comments)    not known  . Adhesive [Tape]     ?  Blister after knee surgery  . Clarithromycin     Unsure of reaction    . Doxycycline     Interfered with seizure medication  . Nickel     Had to remove knee implant with nickel and replace it  . Other     Metal and perfumes  . Topamax [Topiramate] Nausea And Vomiting  . Estradiol Rash  . Latex Rash  . Phenytoin Sodium Extended Other (See Comments)    drowsiness     Review of Systems: All systems reviewed and negative except where noted in HPI.    CT ABDOMEN PELVIS W CONTRAST  Result Date: 05/11/2020 CLINICAL DATA:  Lower abdominal pain with bright red blood per rectum EXAM: CT ABDOMEN AND PELVIS WITH CONTRAST TECHNIQUE: Multidetector CT imaging of the abdomen and pelvis was performed using the standard protocol following bolus administration of intravenous contrast. CONTRAST:  173mL OMNIPAQUE IOHEXOL 300 MG/ML  SOLN COMPARISON:  10/14/2016 FINDINGS: Lower  chest: Emphysematous changes are noted. Mild areas of scarring are seen. No focal infiltrate or sizable effusion is noted. Hepatobiliary: No focal liver abnormality is seen. No gallstones, gallbladder wall thickening, or biliary dilatation. Pancreas: Unremarkable. No pancreatic ductal dilatation or surrounding inflammatory changes. Spleen: Normal in size without focal abnormality. Adrenals/Urinary Tract: Adrenal glands are within normal limits. Kidneys demonstrate no renal calculi or obstructive changes. The ureters are within normal limits. Bladder is partially distended. Stomach/Bowel: Diverticular changes noted with evidence of wall thickening and pericolonic inflammatory change in the descending colon and proximal sigmoid consistent with focal colitis. More proximal colon is within normal limits. The appendix is not well visualized and may have been surgically removed. Small bowel and stomach appear within normal limits. Vascular/Lymphatic: Aortic atherosclerosis. Stable infrarenal aortic dissection is noted without aneurysmal dilatation. No enlarged abdominal or pelvic lymph nodes. Reproductive: Uterus is within normal limits. Left ovary is unremarkable. Small simple appearing cyst is noted within the right ovary measuring 2.3 cm. This is stable in appearance from the prior exam. Other: No abdominal wall hernia or abnormality. No abdominopelvic ascites. Musculoskeletal: Postsurgical changes are noted in the lower lumbar spine. No acute bony abnormality is noted. IMPRESSION: Changes consistent with focal colitis in the descending colon. This is likely inflammatory in nature. No perforation or abscess is seen. Stable right ovarian  simple cyst. No follow-up imaging recommended. Note: This recommendation does not apply to premenarchal patients and to those with increased risk (genetic, family history, elevated tumor markers or other high-risk factors) of ovarian cancer. Reference: JACR 2020 Feb; 17(2):248-254  Scarring in the bases bilaterally with emphysematous changes. Emphysema (ICD10-J43.9). Electronically Signed   By: Inez Catalina M.D.   On: 05/11/2020 16:34   DG Chest Portable 1 View  Result Date: 05/11/2020 CLINICAL DATA:  Shortness of breath, weakness EXAM: PORTABLE CHEST 1 VIEW COMPARISON:  March 05, 2020 FINDINGS: Trachea midline. Cardiomediastinal contours and hilar structures are stable compared to prior imaging. Lungs are clear.  RIGHT hemidiaphragm remains elevated. Leads project over the chest limiting assessment particularly in the RIGHT lower lobe. On limited assessment no acute skeletal process. IMPRESSION: No active disease. Limited assessment of the RIGHT lower lobe due to overlying leads. Electronically Signed   By: Zetta Bills M.D.   On: 05/11/2020 14:25   Lab Results  Component Value Date   WBC 15.7 (H) 05/11/2020   HGB 13.9 05/11/2020   HCT 43.1 05/11/2020   MCV 93.1 05/11/2020   PLT 224 05/11/2020    Lab Results  Component Value Date   CREATININE 1.01 (H) 05/11/2020   BUN 11 05/11/2020   NA 142 05/11/2020   K 3.8 05/11/2020   CL 107 05/11/2020   CO2 26 05/11/2020    Lab Results  Component Value Date   ALT 13 05/11/2020   AST 21 05/11/2020   ALKPHOS 84 05/11/2020   BILITOT 0.6 05/11/2020      Physical Exam: BP (!) 142/74 (BP Location: Left Arm, Patient Position: Sitting, Cuff Size: Normal)   Pulse 85   Ht 5\' 5"  (1.651 m)   Wt 241 lb 9.6 oz (109.6 kg)   SpO2 98%   BMI 40.20 kg/m  Constitutional: Pleasant,well-developed, female in no acute distress. Abdominal: Soft, nondistended, mild lower abdomen tenderness to palpation without rebound or peritoneal signs. There are no masses palpable. . Extremities: no edema Lymphadenopathy: No cervical adenopathy noted. Neurological: Alert and oriented to person place and time. Skin: Skin is warm and dry. No rashes noted. Psychiatric: Normal mood and affect. Behavior is normal.   ASSESSMENT AND  PLAN: 75 year old female here for reassessment of the following:  Abdominal pain / altered bowel habits / abnormal CT scan - as above, has had some mild discomfort in her lower abdomen for a few weeks and then developed loose stools with bright red blood per rectum.  Bleeding lasted about 2 days.  CT scan shows left-sided focal colitis without evidence of diverticulitis.  Given empiric Augmentin, reports about 25% improvement since that time, bleeding has stopped but still has some loose stools with intermittent pain.  Discussed differential diagnosis with her to include infectious colitis, ischemic colitis, IBD, etc.  I had asked her to send for GI pathogen panel which remains pending, I will contact her when that returns.  She does think antibiotics have helped her and would continue Augmentin for now.  Counseled her that Augmentin itself could be causing the persistent diarrhea.  Overall the time course seems a bit atypical for ischemic colitis, I think that perhaps is less likely.  She will continue full course of Augmentin, due to stop that on Friday.  If for some reason she develops worsening/severe abdominal pain, fevers, intolerance of p.o. etc., she needs to contact me but in that setting would recommend going to the ED for repeated imaging.  Hopefully with time  and antibiotics she improves.  If not and her symptoms persist over time she may need a flex sig or colonoscopy to further assess this.  We will provide some Levsin to use as needed for cramps in the interim, pending her course may repeat a CBC in the next week.  We will touch base with her in a few days once we get the results of her stool study, she will contact me in the interim if any worsening etc.  All questions answered  Chronic nausea - extensive evaluation in the past, thought she may have had a medication reaction to the zonisamide however that seems like it is not the case given its persisted when she stopped the drug.  Antiemetics  have not helped at all.  With her acute issues this is likely not going to resolve anytime soon.  We will focus on resolving her colitis first and then discuss if there is any thing additional we can do to work this up moving forward.  Hoyt Cellar, MD [facility]

## 2020-05-15 NOTE — Patient Instructions (Signed)
If you are age 73 or older, your body mass index should be between 23-30. Your Body mass index is 40.2 kg/m. If this is out of the aforementioned range listed, please consider follow up with your Primary Care Provider.  If you are age 27 or younger, your body mass index should be between 19-25. Your Body mass index is 40.2 kg/m. If this is out of the aformentioned range listed, please consider follow up with your Primary Care Provider.   Continue Augmentin.  We have sent the following medications to your pharmacy for you to pick up at your convenience: Levsin: Dissolve 1 to 2 tablets under your tongue every 6 hours as needed  If you develop a fever or worsening pain please go to the emergency room.  Thank you for entrusting me with your care and for choosing [facility], Dr. Glasgow Cellar

## 2020-05-16 ENCOUNTER — Encounter: Payer: Medicare HMO | Admitting: Physical Therapy

## 2020-05-17 LAB — GI PROFILE, STOOL, PCR

## 2020-05-21 ENCOUNTER — Encounter: Payer: Medicare HMO | Admitting: Physical Therapy

## 2020-05-23 ENCOUNTER — Encounter: Payer: Medicare HMO | Admitting: Physical Therapy

## 2020-05-25 DIAGNOSIS — J301 Allergic rhinitis due to pollen: Secondary | ICD-10-CM | POA: Diagnosis not present

## 2020-05-25 DIAGNOSIS — J3081 Allergic rhinitis due to animal (cat) (dog) hair and dander: Secondary | ICD-10-CM | POA: Diagnosis not present

## 2020-05-25 DIAGNOSIS — J3089 Other allergic rhinitis: Secondary | ICD-10-CM | POA: Diagnosis not present

## 2020-05-28 ENCOUNTER — Encounter: Payer: Medicare HMO | Admitting: Physical Therapy

## 2020-05-29 DIAGNOSIS — J3089 Other allergic rhinitis: Secondary | ICD-10-CM | POA: Diagnosis not present

## 2020-05-29 DIAGNOSIS — J3081 Allergic rhinitis due to animal (cat) (dog) hair and dander: Secondary | ICD-10-CM | POA: Diagnosis not present

## 2020-05-29 DIAGNOSIS — J301 Allergic rhinitis due to pollen: Secondary | ICD-10-CM | POA: Diagnosis not present

## 2020-05-30 ENCOUNTER — Encounter: Payer: Medicare HMO | Admitting: Physical Therapy

## 2020-05-31 DIAGNOSIS — J3089 Other allergic rhinitis: Secondary | ICD-10-CM | POA: Diagnosis not present

## 2020-05-31 DIAGNOSIS — J3081 Allergic rhinitis due to animal (cat) (dog) hair and dander: Secondary | ICD-10-CM | POA: Diagnosis not present

## 2020-05-31 DIAGNOSIS — J301 Allergic rhinitis due to pollen: Secondary | ICD-10-CM | POA: Diagnosis not present

## 2020-06-01 ENCOUNTER — Ambulatory Visit: Payer: Medicare HMO | Admitting: Rheumatology

## 2020-06-01 DIAGNOSIS — G4733 Obstructive sleep apnea (adult) (pediatric): Secondary | ICD-10-CM | POA: Diagnosis not present

## 2020-06-04 ENCOUNTER — Encounter: Payer: Medicare HMO | Admitting: Physical Therapy

## 2020-06-05 ENCOUNTER — Ambulatory Visit (INDEPENDENT_AMBULATORY_CARE_PROVIDER_SITE_OTHER): Payer: Medicare HMO | Admitting: Orthotics

## 2020-06-05 ENCOUNTER — Other Ambulatory Visit: Payer: Self-pay

## 2020-06-05 DIAGNOSIS — M216X1 Other acquired deformities of right foot: Secondary | ICD-10-CM | POA: Diagnosis not present

## 2020-06-05 DIAGNOSIS — Z7409 Other reduced mobility: Secondary | ICD-10-CM | POA: Diagnosis not present

## 2020-06-05 DIAGNOSIS — M216X9 Other acquired deformities of unspecified foot: Secondary | ICD-10-CM

## 2020-06-05 DIAGNOSIS — G4733 Obstructive sleep apnea (adult) (pediatric): Secondary | ICD-10-CM | POA: Diagnosis not present

## 2020-06-05 DIAGNOSIS — M216X2 Other acquired deformities of left foot: Secondary | ICD-10-CM

## 2020-06-05 NOTE — Progress Notes (Signed)
Patient came in today to pick up Moore Balance Brace (Bilateral).   Patient was able to don brace independently and brace was check for fit to custom.   Patient was observed walking with brace and gait was improved as well as stability.   Patient was advised of care and wearing instructions.  Advised to notify practice if there were any issues; especially skin irritation.  

## 2020-06-06 ENCOUNTER — Encounter: Payer: Medicare HMO | Admitting: Physical Therapy

## 2020-06-07 DIAGNOSIS — J3089 Other allergic rhinitis: Secondary | ICD-10-CM | POA: Diagnosis not present

## 2020-06-07 DIAGNOSIS — J3081 Allergic rhinitis due to animal (cat) (dog) hair and dander: Secondary | ICD-10-CM | POA: Diagnosis not present

## 2020-06-07 DIAGNOSIS — J301 Allergic rhinitis due to pollen: Secondary | ICD-10-CM | POA: Diagnosis not present

## 2020-06-19 DIAGNOSIS — J301 Allergic rhinitis due to pollen: Secondary | ICD-10-CM | POA: Diagnosis not present

## 2020-06-19 DIAGNOSIS — J3089 Other allergic rhinitis: Secondary | ICD-10-CM | POA: Diagnosis not present

## 2020-06-19 DIAGNOSIS — J3081 Allergic rhinitis due to animal (cat) (dog) hair and dander: Secondary | ICD-10-CM | POA: Diagnosis not present

## 2020-06-20 ENCOUNTER — Other Ambulatory Visit: Payer: Self-pay | Admitting: Physician Assistant

## 2020-06-22 DIAGNOSIS — J3081 Allergic rhinitis due to animal (cat) (dog) hair and dander: Secondary | ICD-10-CM | POA: Diagnosis not present

## 2020-06-22 DIAGNOSIS — J3089 Other allergic rhinitis: Secondary | ICD-10-CM | POA: Diagnosis not present

## 2020-06-22 DIAGNOSIS — J301 Allergic rhinitis due to pollen: Secondary | ICD-10-CM | POA: Diagnosis not present

## 2020-06-28 DIAGNOSIS — J301 Allergic rhinitis due to pollen: Secondary | ICD-10-CM | POA: Diagnosis not present

## 2020-06-28 DIAGNOSIS — J3081 Allergic rhinitis due to animal (cat) (dog) hair and dander: Secondary | ICD-10-CM | POA: Diagnosis not present

## 2020-06-28 DIAGNOSIS — J3089 Other allergic rhinitis: Secondary | ICD-10-CM | POA: Diagnosis not present

## 2020-06-29 ENCOUNTER — Ambulatory Visit: Payer: Medicare HMO | Admitting: Rheumatology

## 2020-07-03 DIAGNOSIS — G4733 Obstructive sleep apnea (adult) (pediatric): Secondary | ICD-10-CM | POA: Diagnosis not present

## 2020-07-05 DIAGNOSIS — J301 Allergic rhinitis due to pollen: Secondary | ICD-10-CM | POA: Diagnosis not present

## 2020-07-05 DIAGNOSIS — J3081 Allergic rhinitis due to animal (cat) (dog) hair and dander: Secondary | ICD-10-CM | POA: Diagnosis not present

## 2020-07-05 DIAGNOSIS — J3089 Other allergic rhinitis: Secondary | ICD-10-CM | POA: Diagnosis not present

## 2020-07-09 ENCOUNTER — Ambulatory Visit: Payer: Medicare HMO | Admitting: Internal Medicine

## 2020-07-10 DIAGNOSIS — Z124 Encounter for screening for malignant neoplasm of cervix: Secondary | ICD-10-CM | POA: Diagnosis not present

## 2020-07-10 DIAGNOSIS — N951 Menopausal and female climacteric states: Secondary | ICD-10-CM | POA: Diagnosis not present

## 2020-07-10 DIAGNOSIS — Z01411 Encounter for gynecological examination (general) (routine) with abnormal findings: Secondary | ICD-10-CM | POA: Diagnosis not present

## 2020-07-10 DIAGNOSIS — Z1231 Encounter for screening mammogram for malignant neoplasm of breast: Secondary | ICD-10-CM | POA: Diagnosis not present

## 2020-07-10 DIAGNOSIS — Z6839 Body mass index (BMI) 39.0-39.9, adult: Secondary | ICD-10-CM | POA: Diagnosis not present

## 2020-07-10 DIAGNOSIS — N3281 Overactive bladder: Secondary | ICD-10-CM | POA: Diagnosis not present

## 2020-07-10 DIAGNOSIS — Z01419 Encounter for gynecological examination (general) (routine) without abnormal findings: Secondary | ICD-10-CM | POA: Diagnosis not present

## 2020-07-10 LAB — HM MAMMOGRAPHY

## 2020-07-11 ENCOUNTER — Encounter: Payer: Self-pay | Admitting: Family Medicine

## 2020-07-13 DIAGNOSIS — J3089 Other allergic rhinitis: Secondary | ICD-10-CM | POA: Diagnosis not present

## 2020-07-13 DIAGNOSIS — J301 Allergic rhinitis due to pollen: Secondary | ICD-10-CM | POA: Diagnosis not present

## 2020-07-13 DIAGNOSIS — J3081 Allergic rhinitis due to animal (cat) (dog) hair and dander: Secondary | ICD-10-CM | POA: Diagnosis not present

## 2020-07-16 ENCOUNTER — Other Ambulatory Visit: Payer: Self-pay | Admitting: Obstetrics & Gynecology

## 2020-07-16 DIAGNOSIS — R5381 Other malaise: Secondary | ICD-10-CM

## 2020-07-16 DIAGNOSIS — E2839 Other primary ovarian failure: Secondary | ICD-10-CM

## 2020-07-20 DIAGNOSIS — J3081 Allergic rhinitis due to animal (cat) (dog) hair and dander: Secondary | ICD-10-CM | POA: Diagnosis not present

## 2020-07-20 DIAGNOSIS — J3089 Other allergic rhinitis: Secondary | ICD-10-CM | POA: Diagnosis not present

## 2020-07-20 DIAGNOSIS — J301 Allergic rhinitis due to pollen: Secondary | ICD-10-CM | POA: Diagnosis not present

## 2020-07-23 ENCOUNTER — Other Ambulatory Visit: Payer: Self-pay | Admitting: Internal Medicine

## 2020-07-25 ENCOUNTER — Other Ambulatory Visit: Payer: Self-pay

## 2020-07-25 ENCOUNTER — Ambulatory Visit: Payer: Medicare HMO | Admitting: Neurology

## 2020-07-25 ENCOUNTER — Encounter: Payer: Self-pay | Admitting: Neurology

## 2020-07-25 VITALS — BP 134/73 | HR 95 | Ht 65.0 in | Wt 238.0 lb

## 2020-07-25 DIAGNOSIS — F0781 Postconcussional syndrome: Secondary | ICD-10-CM

## 2020-07-25 DIAGNOSIS — G40309 Generalized idiopathic epilepsy and epileptic syndromes, not intractable, without status epilepticus: Secondary | ICD-10-CM | POA: Diagnosis not present

## 2020-07-25 DIAGNOSIS — J301 Allergic rhinitis due to pollen: Secondary | ICD-10-CM | POA: Diagnosis not present

## 2020-07-25 DIAGNOSIS — J3089 Other allergic rhinitis: Secondary | ICD-10-CM | POA: Diagnosis not present

## 2020-07-25 DIAGNOSIS — R69 Illness, unspecified: Secondary | ICD-10-CM | POA: Diagnosis not present

## 2020-07-25 DIAGNOSIS — J3081 Allergic rhinitis due to animal (cat) (dog) hair and dander: Secondary | ICD-10-CM | POA: Diagnosis not present

## 2020-07-25 MED ORDER — AMITRIPTYLINE HCL 10 MG PO TABS: ORAL_TABLET | ORAL | 3 refills | Status: DC

## 2020-07-25 NOTE — Patient Instructions (Signed)
1. Schedule MRI brain without contrast  2. Start amitriptyline 10mg : take 1 tablet every night. Update me in a week if any side effects, we can increase dose if needed. Hopefully this can get you back to tolerating CPAP again  3. Resume physical therapy for balance, use cane at all times  4. Continue all your other medications  5. Follow-up in 3 months, call for any changes

## 2020-07-25 NOTE — Progress Notes (Signed)
NEUROLOGY FOLLOW UP OFFICE NOTE  Beverly Ballard [id number] Aug 18, 1945  HISTORY OF PRESENT ILLNESS: I had the pleasure of seeing Beverly Ballard in follow-up in the neurology clinic on 07/25/2020.  The patient was last seen 8 months ago for seizures. She presents for an earlier visit due to new symptoms. She had a fall in November and had a cut over her right eye. She went to the ER where steri-strips were applied. Head CT no acute changes.   Since then, her head just does not feel right. She has some difficulty describing symptoms, she feels foggy and has had changes in sleep pattern, dizziness, balance changes, headaches. She keeps getting pain where she cut her eye on the right brow region, going across the front of her head with a slight headache. They can occur 1-2 times in a day, about 2-3 times a week. She has chronic nausea, no change in baseline nausea. She describes the dizziness as a floating sensation lasting a few seconds. They can occur in any position, she would be sitting then suddenly feels dizzy. She has not checked her BP during these times. She has not been using her CPAP machine as she should because it is not helping with the sensation in her head. There are nights she skips using it. She is not sleeping as well, but then states she is sleeping all the time. Mood is not great, she does not want to go anywhere, this may have worsened since the fall as well. Her balance has been off, she has had 2 more falls since November. She had a fall while in PT and has been scared to go anywhere. She now uses a cane and has not had falls with her cane. She has chronic back pain. She continues on Zonisamide 200mg  BID with no seizures since 2014.   History on Initial Assessment 10/28/2019: This is a 75 year old right-handed woman with a history of hypertension, hyperlipidemia, hypothyroidism, migraines, vaginal cancer, complex partial seizures, presenting for second opinion regarding seizure medication.  Records from her neurologist Dr. Macario Carls were reviewed. Seizures started in childhood then stopped for a few years until she was in her teens. She thinks she had staring episodes, no further staring spells since 1988. She had a seizure in 2014 when she was completely weaned off seizure medication. She recalls she would smell a funny odor prior to a seizure. No nocturnal seizures. She has tried several seizure medications, she could not tolerate Levetiracetam, Lamotrigine. She had hyponatremia on carbamazepine. Aptiom was cost-prohibitive. She has been taking Zonisamide 200mg  BID since 2017. MRI brain with and without contrast in 2017 was unremarkable, there was a small Rathke's cleft cyst within the pituitary gland measuring 87mm.  She presents for second opinion regarding her seizure medication. She has been having a lot of health problems and has had a "test on every part of my body" with no clear cause found. She has been suffering from nausea for the past 5 years, no vomiting, occasional stomach pain. She is having a lot of drowsiness. She feels sick to her stomach right now and has an acidic taste. She lives with her husband and denies any staring/unresponsive episodes, gaps in time, olfactory/gustatory hallucinations, myoclonic jerks. She has occasional left arm tingling. She has right-sided neck pain and back pain. No headaches, dizziness, vision changes, bowel/bladder dysfunction. Shd a fall 2-3 weeks ago when she fell backward. She is not sure if she passed out, mostly reporting she has not  been feeling good for a long time. She had a pneumonia 3 years ago where she had inflammation in her lungs and was not feeling well, she had another bout this year, as well as dealing with depression. She has a CPAP machine which has helped her sleep a little better at night.  Epilepsy Risk Factors:  She had a normal birth and early development.  There is no history of febrile convulsions, CNS infections such as  meningitis/encephalitis, significant traumatic brain injury, neurosurgical procedures, or family history of seizures.  Prior AEDs: Levetiracetam, Lamotrigine, carbamazepine, lacosamide  Diagnostic Data: MRI brain with and without contrast in 2017 was unremarkable, there was a small Rathke's cleft cyst within the pituitary gland measuring 34mm. Her 1-hour EEG in July 2021 was abnormal with frequent bursts of generalized high voltage 3-5 Hz spike and polyspike and wave discharges.   PAST MEDICAL HISTORY: Past Medical History:  Diagnosis Date  . Allergy   . Anxiety and depression 12/19/2009  . Arthritis   . Cancer (HCC)    vaginal  . Constipation   . Degenerative disorder of eye   . Diverticulosis   . Essential hypertension, benign 06/26/2009  . GERD 05/27/2007   takes Nexium daily  . Heart murmur    yrs ago  . History of gout   . History of migraine many yrs ago  . Hyperlipidemia    takes Pravastatin daily  . Hypothyroidism   . Insomnia    but doesn't take any meds  . LOW BACK PAIN 05/27/2007  . Numbness    left toes   . Osteoporosis   . Seizures ([place])    last 1983, none since then- last seizure 1988 per pt     MEDICATIONS: Current Outpatient Medications on File Prior to Visit  Medication Sig Dispense Refill  . albuterol (VENTOLIN HFA) 108 (90 Base) MCG/ACT inhaler Inhale 1-2 puffs into the lungs every 6 (six) hours as needed for wheezing or shortness of breath. 8 g 0  . amoxicillin-clavulanate (AUGMENTIN) 875-125 MG tablet Take 1 tablet by mouth every 12 (twelve) hours. 14 tablet 0  . b complex vitamins tablet Take 1 tablet by mouth daily.    . beta carotene w/minerals (OCUVITE) tablet Take 1 tablet by mouth daily.    . Cholecalciferol (VITAMIN D3 PO) Take by mouth.    . cyclobenzaprine (FLEXERIL) 10 MG tablet Take 1 tablet (10 mg total) by mouth 2 (two) times daily as needed for muscle spasms. 30 tablet 0  . EPINEPHrine 0.3 mg/0.3 mL IJ SOAJ injection SMARTSIG:Injection As  Directed    . esomeprazole (NEXIUM) 20 MG capsule Take 2 capsules (40 mg total) by mouth daily before breakfast. (Patient taking differently: Take 20 mg by mouth daily before breakfast.) 2 capsule 0  . furosemide (LASIX) 20 MG tablet TAKE 1 TABLET BY MOUTH IN THE MORNING 30 tablet 1  . HYDROcodone-acetaminophen (NORCO/VICODIN) 5-325 MG tablet Take 1-2 tablets by mouth every 6 (six) hours as needed. 10 tablet 0  . hyoscyamine (LEVSIN SL) 0.125 MG SL tablet Place 1-2 tablets (0.125-0.25 mg total) under the tongue every 6 (six) hours as needed. 30 tablet 1  . levocetirizine (XYZAL) 5 MG tablet Take 5 mg by mouth daily.     Marland Kitchen levothyroxine (SYNTHROID) 88 MCG tablet TAKE 1 TABLET BY MOUTH EVERY DAY 90 tablet 0  . lisinopril (ZESTRIL) 5 MG tablet TAKE 1 TABLET BY MOUTH EVERY DAY 90 tablet 1  . LORazepam (ATIVAN) 0.5 MG tablet Take 1  tablet (0.5 mg total) by mouth 4 (four) times daily as needed for anxiety. 60 tablet 0  . methocarbamol (ROBAXIN) 500 MG tablet Take 1 tablet (500 mg total) by mouth 2 (two) times daily as needed. 20 tablet 0  . ondansetron (ZOFRAN ODT) 4 MG disintegrating tablet Take 1 tablet (4 mg total) by mouth every 6 (six) hours as needed for nausea or vomiting. 30 tablet 3  . pravastatin (PRAVACHOL) 20 MG tablet TAKE 1 TABLET (20 MG TOTAL) BY MOUTH DAILY. TAKE 1 TABLET BY MOUTH EVERY DAY 90 tablet 1  . sertraline (ZOLOFT) 100 MG tablet TAKE 2 TABLETS BY MOUTH EVERY DAY 180 tablet 0  . Spacer/Aero-Holding Chambers (AEROCHAMBER PLUS) inhaler Use as instructed 1 each 0  . Spacer/Aero-Holding Dorise Bullion Use as directed with inhaler 1 each 0  . zonisamide (ZONEGRAN) 100 MG capsule Take 2 capsules twice a day 360 capsule 3   No current facility-administered medications on file prior to visit.    ALLERGIES: Allergies  Allergen Reactions  . Lamotrigine Swelling    Tongue swelling  . Bupropion Other (See Comments)    not known  . Carbamazepine Other (See Comments)    Hypnatremia   . Levetiracetam     Other reaction(s): Dizziness (intolerance)  . Tramadol Other (See Comments)    not known  . Adhesive [Tape]     ?  Blister after knee surgery  . Clarithromycin     Unsure of reaction    . Doxycycline     Interfered with seizure medication  . Nickel     Had to remove knee implant with nickel and replace it  . Other     Metal and perfumes  . Topamax [Topiramate] Nausea And Vomiting  . Estradiol Rash  . Latex Rash  . Phenytoin Sodium Extended Other (See Comments)    drowsiness    FAMILY HISTORY: Family History  Problem Relation Age of Onset  . Aortic dissection Father   . Arthritis Mother   . Stroke Mother 33  . Heart murmur Other   . Throat cancer Sister   . Colon cancer Neg Hx   . Esophageal cancer Neg Hx   . Stomach cancer Neg Hx   . Rectal cancer Neg Hx   . Colon polyps Neg Hx     SOCIAL HISTORY: Social History   Socioeconomic History  . Marital status: Married    Spouse name: Not on file  . Number of children: 1  . Years of education: Not on file  . Highest education level: Not on file  Occupational History  . Occupation: Retired  Tobacco Use  . Smoking status: Former Smoker    Packs/day: 0.25    Years: 20.00    Pack years: 5.00    Types: Cigarettes    Quit date: 09/08/1978    Years since quitting: 41.9  . Smokeless tobacco: Never Used  . Tobacco comment: quit smoking 79yrs ago  Vaping Use  . Vaping Use: Never used  Substance and Sexual Activity  . Alcohol use: No  . Drug use: No  . Sexual activity: Never    Birth control/protection: Post-menopausal  Other Topics Concern  . Not on file  Social History Narrative   Right handed    Lives with husband    Social Determinants of Health   Financial Resource Strain: Low Risk   . Difficulty of Paying Living Expenses: Not hard at all  Food Insecurity: No Food Insecurity  . Worried About Running  Out of Food in the Last Year: Never true  . Ran Out of Food in the Last Year: Never  true  Transportation Needs: No Transportation Needs  . Lack of Transportation (Medical): No  . Lack of Transportation (Non-Medical): No  Physical Activity: Inactive  . Days of Exercise per Week: 0 days  . Minutes of Exercise per Session: 0 min  Stress: Stress Concern Present  . Feeling of Stress : To some extent  Social Connections: Socially Isolated  . Frequency of Communication with Friends and Family: Twice a week  . Frequency of Social Gatherings with Friends and Family: Never  . Attends Religious Services: Never  . Active Member of Clubs or Organizations: No  . Attends Archivist Meetings: Never  . Marital Status: Married  Human resources officer Violence: Not At Risk  . Fear of Current or Ex-Partner: No  . Emotionally Abused: No  . Physically Abused: No  . Sexually Abused: No     PHYSICAL EXAM: Vitals:   07/25/20 0934  BP: 134/73  Pulse: 95  SpO2: 93%   General: No acute distress Head:  Normocephalic/atraumatic Skin/Extremities: No rash, no edema Neurological Exam: alert and awake. No aphasia or dysarthria. Fund of knowledge is appropriate.  Attention and concentration are normal.   Cranial nerves: Pupils equal, round. Extraocular movements intact with no nystagmus. Visual fields full.  No facial asymmetry.  Motor: Bulk and tone normal, muscle strength 5/5 throughout with no pronator drift. Sensation decreased to cold on left LE, decreased vibration sense to both knees. Finger to nose testing intact.  Gait wide-based, unsteady. She has bilateral postural and endpoint tremors (unchanged).   IMPRESSION: This is a 75 yo RH woman with a history of hypertension, hyperlipidemia, hypothyroidism, migraines, vaginal cancer, primary generalized epilepsy, seizure-free since 2014, presenting for new symptoms suggestive of post-concussion syndrome after a fall in November 2021. She has not felt good since then, stating her head does not feel good/foggy, with headaches, sleep  difficulties, worsening balance. MRI brain without contrast will be ordered to assess for underlying structural abnormality. We discussed symptomatic treatment with postconcussion syndrome, she will start low dose amitriptyline 10mg  qhs for the headaches and sleep difficulties. Update our office in a week, we may uptitrate dose as tolerated. This may help with mood as well. Encouraged to resume physical therapy to work on balance, use cane at all times. Continue Zonisamide 200mg  BID for seizure prophylaxis. Follow-up in 3 months, call for any changes.    Thank you for allowing me to participate in her care.  Please do not hesitate to call for any questions or concerns.   Ellouise Newer, M.D.   CC: Dr. Ethlyn Gallery

## 2020-07-25 NOTE — Progress Notes (Signed)
Received fax approval for amitriptyline valid from 07/25/20 to 04/20/21.

## 2020-07-27 ENCOUNTER — Ambulatory Visit
Admission: RE | Admit: 2020-07-27 | Discharge: 2020-07-27 | Disposition: A | Discharge status: Home / Self Care | Payer: Medicare HMO | Source: Ambulatory Visit | Attending: Neurology | Admitting: Neurology

## 2020-07-27 DIAGNOSIS — S060X0A Concussion without loss of consciousness, initial encounter: Secondary | ICD-10-CM | POA: Diagnosis not present

## 2020-07-27 DIAGNOSIS — F0781 Postconcussional syndrome: Secondary | ICD-10-CM

## 2020-08-03 DIAGNOSIS — G4733 Obstructive sleep apnea (adult) (pediatric): Secondary | ICD-10-CM | POA: Diagnosis not present

## 2020-08-04 ENCOUNTER — Inpatient Hospital Stay: Admission: RE | Admit: 2020-08-04 | Payer: Medicare HMO | Source: Ambulatory Visit

## 2020-08-04 ENCOUNTER — Other Ambulatory Visit: Payer: Medicare HMO

## 2020-08-06 ENCOUNTER — Telehealth: Payer: Self-pay

## 2020-08-06 NOTE — Telephone Encounter (Signed)
Pt called and informed that brain MRI did not show any evidence of tumor, stroke, bleed, or scar tissue. No worrisome changes/permanent injury. Continue with physical therapy and amitriptyline as discussed

## 2020-08-06 NOTE — Telephone Encounter (Signed)
-----   Message from Cameron Sprang, MD sent at 08/05/2020 11:12 PM EDT ----- Pls let her know the brain MRI did not show any evidence of tumor, stroke, bleed, or scar tissue. No worrisome changes/permanent injury. Continue with physical therapy and amitriptyline as discussed, thanks

## 2020-08-07 DIAGNOSIS — J3089 Other allergic rhinitis: Secondary | ICD-10-CM | POA: Diagnosis not present

## 2020-08-07 DIAGNOSIS — J301 Allergic rhinitis due to pollen: Secondary | ICD-10-CM | POA: Diagnosis not present

## 2020-08-07 DIAGNOSIS — J3081 Allergic rhinitis due to animal (cat) (dog) hair and dander: Secondary | ICD-10-CM | POA: Diagnosis not present

## 2020-08-08 ENCOUNTER — Other Ambulatory Visit: Payer: Self-pay

## 2020-08-08 ENCOUNTER — Ambulatory Visit (INDEPENDENT_AMBULATORY_CARE_PROVIDER_SITE_OTHER): Payer: Medicare HMO | Admitting: Physician Assistant

## 2020-08-08 ENCOUNTER — Encounter: Payer: Self-pay | Admitting: Physician Assistant

## 2020-08-08 DIAGNOSIS — R69 Illness, unspecified: Secondary | ICD-10-CM | POA: Diagnosis not present

## 2020-08-08 DIAGNOSIS — G4733 Obstructive sleep apnea (adult) (pediatric): Secondary | ICD-10-CM

## 2020-08-08 DIAGNOSIS — F331 Major depressive disorder, recurrent, moderate: Secondary | ICD-10-CM | POA: Diagnosis not present

## 2020-08-08 DIAGNOSIS — F411 Generalized anxiety disorder: Secondary | ICD-10-CM

## 2020-08-08 DIAGNOSIS — G4700 Insomnia, unspecified: Secondary | ICD-10-CM

## 2020-08-08 NOTE — Progress Notes (Signed)
[facility] Med Check  Patient ID: Beverly Ballard,  MRN: [id number]  PCP: Caren Macadam, MD  Date of Evaluation: 08/08/2020 Time spent:40 minutes  Chief Complaint:  Chief Complaint    Anxiety; Depression; Follow-up      HISTORY/CURRENT STATUS: HPI  For 4 month med check.   Has fallen several times since LOV. No serious injuries.Has been in PT trying to help balance. Pt has hx of seizures and sees Dr. Delice Lesch, on [address]. Saw her a few weeks ago and dx w/ post-concussive syndrome, since she has not felt well after the fall November 2021 when she hit her head.  An MRI will be done to rule out brain abnormalities.  Was Rx Elavil by Dr. Delice Lesch, but states her pharmacist told her not to take it b/c she's on the Zoloft, so she hasn't.  States she is happy but afraid to go out and do much of anything because of fear of falling.  She does enjoy being at home, has always been kind of a loner, and enjoys playing word games on her tablet.  Denies decreased energy or motivation.  Appetite has not changed.  No extreme sadness, tearfulness, or feelings of hopelessness. Sleep sometimes an issue. Difficult falling to sleep and staying asleep.  Uses her CPAP most of the time. Anxiety is controlled. Uses the Ativan which is still helpful.    Denies suicidal or homicidal thoughts.  Patient denies increased energy with decreased need for sleep, no increased talkativeness, no racing thoughts, no impulsivity or risky behaviors, no increased spending, no increased libido, no grandiosity, no increased irritability or anger, no paranoia, and no hallucinations.  Individual Medical History/ Review of Systems: Changes? :Yes  Frequent falls. Dr. Amparo Bristol note reviewed.  Had rectal bleeding, ER visit 05/11/2020.  Note and study results reviewed.  Past medications for mental health diagnoses include: Paxil, Prozac, Valium  Allergies: Lamotrigine, Bupropion, Carbamazepine, Levetiracetam, Tramadol, Adhesive  [tape], Clarithromycin, Doxycycline, Nickel, Other, Topamax [topiramate], Estradiol, Latex, and Phenytoin sodium extended  Current Medications:  Current Outpatient Medications:  .  albuterol (VENTOLIN HFA) 108 (90 Base) MCG/ACT inhaler, Inhale 1-2 puffs into the lungs every 6 (six) hours as needed for wheezing or shortness of breath., Disp: 8 g, Rfl: 0 .  b complex vitamins tablet, Take 1 tablet by mouth daily., Disp: , Rfl:  .  beta carotene w/minerals (OCUVITE) tablet, Take 1 tablet by mouth daily., Disp: , Rfl:  .  Cholecalciferol (VITAMIN D3 PO), Take by mouth., Disp: , Rfl:  .  EPINEPHrine 0.3 mg/0.3 mL IJ SOAJ injection, SMARTSIG:Injection As Directed, Disp: , Rfl:  .  esomeprazole (NEXIUM) 20 MG capsule, Take 2 capsules (40 mg total) by mouth daily before breakfast. (Patient taking differently: Take 20 mg by mouth daily before breakfast.), Disp: 2 capsule, Rfl: 0 .  furosemide (LASIX) 20 MG tablet, TAKE 1 TABLET BY MOUTH IN THE MORNING, Disp: 30 tablet, Rfl: 1 .  hyoscyamine (LEVSIN SL) 0.125 MG SL tablet, Place 1-2 tablets (0.125-0.25 mg total) under the tongue every 6 (six) hours as needed., Disp: 30 tablet, Rfl: 1 .  levocetirizine (XYZAL) 5 MG tablet, Take 5 mg by mouth daily. , Disp: , Rfl:  .  levothyroxine (SYNTHROID) 88 MCG tablet, TAKE 1 TABLET BY MOUTH EVERY DAY, Disp: 90 tablet, Rfl: 0 .  lisinopril (ZESTRIL) 5 MG tablet, TAKE 1 TABLET BY MOUTH EVERY DAY, Disp: 90 tablet, Rfl: 1 .  LORazepam (ATIVAN) 0.5 MG tablet, Take 1 tablet (0.5 mg total) by  mouth 4 (four) times daily as needed for anxiety., Disp: 60 tablet, Rfl: 0 .  ondansetron (ZOFRAN ODT) 4 MG disintegrating tablet, Take 1 tablet (4 mg total) by mouth every 6 (six) hours as needed for nausea or vomiting., Disp: 30 tablet, Rfl: 3 .  pravastatin (PRAVACHOL) 20 MG tablet, TAKE 1 TABLET (20 MG TOTAL) BY MOUTH DAILY. TAKE 1 TABLET BY MOUTH EVERY DAY, Disp: 90 tablet, Rfl: 1 .  sertraline (ZOLOFT) 100 MG tablet, TAKE 2 TABLETS  BY MOUTH EVERY DAY, Disp: 180 tablet, Rfl: 0 .  Spacer/Aero-Holding Chambers (AEROCHAMBER PLUS) inhaler, Use as instructed, Disp: 1 each, Rfl: 0 .  Spacer/Aero-Holding Chambers DEVI, Use as directed with inhaler, Disp: 1 each, Rfl: 0 .  zonisamide (ZONEGRAN) 100 MG capsule, Take 2 capsules twice a day, Disp: 360 capsule, Rfl: 3 .  amitriptyline (ELAVIL) 10 MG tablet, Take 1 tablet every night, Disp: 30 tablet, Rfl: 3 .  amoxicillin-clavulanate (AUGMENTIN) 875-125 MG tablet, Take 1 tablet by mouth every 12 (twelve) hours., Disp: 14 tablet, Rfl: 0 .  cyclobenzaprine (FLEXERIL) 10 MG tablet, Take 1 tablet (10 mg total) by mouth 2 (two) times daily as needed for muscle spasms., Disp: 30 tablet, Rfl: 0 .  HYDROcodone-acetaminophen (NORCO/VICODIN) 5-325 MG tablet, Take 1-2 tablets by mouth every 6 (six) hours as needed., Disp: 10 tablet, Rfl: 0 Medication Side Effects: none  Family Medical/ Social History: Changes? No  MENTAL HEALTH EXAM:  There were no vitals taken for this visit.There is no height or weight on file to calculate BMI.  General Appearance: Casual, Neat, Well Groomed and Obese  Eye Contact:  Good  Speech:  Clear and Coherent and Normal Rate  Volume:  Normal  Mood:  Depressed  Affect:  Depressed  Thought Process:  Goal Directed and Descriptions of Associations: Circumstantial  Orientation:  Full (Time, Place, and Person)  Thought Content: Logical   Suicidal Thoughts:  No  Homicidal Thoughts:  No  Memory:  Immediate;   Fair  Judgement:  Good  Insight:  Good  Psychomotor Activity:  walks very carefully, not using cane  Concentration:  Concentration: Good  Recall:  Good  Fund of Knowledge: Good  Language: Good  Assets:  Desire for Improvement  ADL's:  Intact  Cognition: WNL  Prognosis:  Good    DIAGNOSES:    ICD-10-CM   1. Major depressive disorder, recurrent episode, moderate (HCC)  F33.1   2. Generalized anxiety disorder  F41.1   3. Insomnia, unspecified type   G47.00   4. Obstructive sleep apnea  G47.33     Receiving Psychotherapy: No    RECOMMENDATIONS:  PDMP was reviewed. I provided 40 minutes of face-to-face time during this encounter, including time spent in records review and charting. Advised her to listen to Dr. Delice Lesch and if she has questions about a medication, call her office, don't take the pharmacist word for it.  Dr. Delice Lesch can see our med list and is aware she is on Zoloft. It is safe to take the Elavil at that low dose, even though she's on Zoloft. It can help her sleep and w/ migraines. Pt states she will start it. From my standpoint, she is stable.  She seems to have a melancholy disposition and this is her baseline.  States she is happy with the medications and does not feel that any changes need to be made. Continue Ativan 0.5 mg, 1 p.o. 4 times daily as needed anxiety. Continue Zoloft 100 mg, 2 p.o. daily. Continue  multivitamin, B complex, vitamin D, and Fish oil. Return in 4 months.  Donnal Moat, PA-C

## 2020-08-09 DIAGNOSIS — J301 Allergic rhinitis due to pollen: Secondary | ICD-10-CM | POA: Diagnosis not present

## 2020-08-09 DIAGNOSIS — J3089 Other allergic rhinitis: Secondary | ICD-10-CM | POA: Diagnosis not present

## 2020-08-09 DIAGNOSIS — J3081 Allergic rhinitis due to animal (cat) (dog) hair and dander: Secondary | ICD-10-CM | POA: Diagnosis not present

## 2020-08-10 ENCOUNTER — Ambulatory Visit (INDEPENDENT_AMBULATORY_CARE_PROVIDER_SITE_OTHER): Payer: Medicare HMO

## 2020-08-10 ENCOUNTER — Encounter: Payer: Self-pay | Admitting: Orthopaedic Surgery

## 2020-08-10 ENCOUNTER — Ambulatory Visit: Payer: Self-pay

## 2020-08-10 ENCOUNTER — Ambulatory Visit (INDEPENDENT_AMBULATORY_CARE_PROVIDER_SITE_OTHER): Payer: Medicare HMO | Admitting: Orthopaedic Surgery

## 2020-08-10 ENCOUNTER — Ambulatory Visit: Payer: Medicare HMO | Admitting: Internal Medicine

## 2020-08-10 ENCOUNTER — Telehealth: Payer: Self-pay | Admitting: Orthopaedic Surgery

## 2020-08-10 DIAGNOSIS — M25571 Pain in right ankle and joints of right foot: Secondary | ICD-10-CM

## 2020-08-10 DIAGNOSIS — M25562 Pain in left knee: Secondary | ICD-10-CM

## 2020-08-10 DIAGNOSIS — M25561 Pain in right knee: Secondary | ICD-10-CM

## 2020-08-10 NOTE — Progress Notes (Signed)
Office Visit Note   Patient: Beverly Ballard           Date of Birth: 28-Sep-1945           MRN: [id number] Visit Date: 08/10/2020              Requested by: Caren Macadam, MD [place],  [place] [address] PCP: Caren Macadam, MD   Assessment & Plan: Visit Diagnoses:  1. Acute pain of both knees   2. Pain in right ankle and joints of right foot     Plan: My impression is bilateral knee contusion and right ankle sprain.  She will basically wants reassurance that the x-rays look fine.  She will continue with conservative and symptomatic treatment.  Follow-up as needed.  Follow-Up Instructions: Return if symptoms worsen or fail to improve.   Orders:  Orders Placed This Encounter  Procedures  . XR KNEE 3 VIEW LEFT  . XR KNEE 3 VIEW RIGHT  . XR Ankle Complete Right   No orders of the defined types were placed in this encounter.     Procedures: No procedures performed   Clinical Data: No additional findings.   Subjective: Chief Complaint  Patient presents with  . Right Knee - Injury, Pain  . Left Knee - Injury, Pain  . Right Ankle - Injury, Pain    Kanasia is a 75 year old female well-known to me comes in for evaluation of acute bilateral knee pain and right ankle pain after she fell about 2 days ago and landed directly on her knees and twisted her right ankle.  She is endorsing lateral ankle pain and swelling.  Both of her knees hurt throughout.  She is currently taking over-the-counter medications and wants to be checked out with x-rays.  She is able to weight-bear on her legs and she has been walker because of the pain.   Review of Systems  Constitutional: Negative.   HENT: Negative.   Eyes: Negative.   Respiratory: Negative.   Cardiovascular: Negative.   Endocrine: Negative.   Musculoskeletal: Negative.   Neurological: Negative.   Hematological: Negative.   Psychiatric/Behavioral: Negative.   All other systems reviewed and are  negative.    Objective: Vital Signs: There were no vitals taken for this visit.  Physical Exam Vitals and nursing note reviewed.  Constitutional:      Appearance: She is well-developed.  HENT:     Head: Normocephalic and atraumatic.  Pulmonary:     Effort: Pulmonary effort is normal.  Abdominal:     Palpations: Abdomen is soft.  Musculoskeletal:     Cervical back: Neck supple.  Skin:    General: Skin is warm.     Capillary Refill: Capillary refill takes less than 2 seconds.  Neurological:     Mental Status: She is alert and oriented to person, place, and time.  Psychiatric:        Behavior: Behavior normal.        Thought Content: Thought content normal.        Judgment: Judgment normal.     Ortho Exam Right knee shows a superficial abrasion.  No joint effusion.  Range of motion causes just mild discomfort.  Collaterals and cruciates are stable.  Strength is normal. Left knee shows a fully healed surgical scar.  Collaterals are stable.  Range of motion is at baseline.  Strength is slightly limited secondary to pain. Right ankle shows mild swelling without significant tenderness to palpation.  No bony crepitus.  Range of motion produces mild discomfort. Specialty Comments:  No specialty comments available.  Imaging: XR Ankle Complete Right  Result Date: 08/10/2020 Negative for acute findings.  XR KNEE 3 VIEW LEFT  Result Date: 08/10/2020 Stable left total knee replacement with revision components without complication.  No acute abnormalities.  XR KNEE 3 VIEW RIGHT  Result Date: 08/10/2020 Moderate joint space narrowing of the right knee.  No acute abnormalities.    PMFS History: Patient Active Problem List   Diagnosis Date Noted  . Ankle edema, bilateral 10/31/2019  . Excessive daytime sleepiness 06/28/2019  . Shortness of breath 06/28/2019  . Healthcare maintenance 06/28/2019  . ANA positive 06/28/2019  . Bronchiectasis ([place]) 06/28/2019  . Depression,  major, single episode, moderate ([place]) 11/19/2018  . Fatigue 03/23/2018  . Recurrent major depressive disorder, in partial remission ([place]) 05/04/2017  . Left knee pain 01/12/2017  . Ovarian cyst 10/30/2016  . Aortic atherosclerosis ([place]) 11/19/2015  . Hyperlipemia 04/03/2015  . S/P revision of total knee 09/19/2014  . Seizure disorder ([place]) 01/24/2014  . Obesity, BMI unknown 01/24/2014  . Osteopenia, stable on dexa 2010 - followed by gyn 06/15/2012  . Pap smear abnormality of cervix/human papillomavirus (HPV) positive - 09/2011, followed by gyn 06/15/2012  . Essential hypertension 07/03/2009  . Hypothyroidism 05/27/2007  . Allergic rhinitis 05/27/2007  . GERD 05/27/2007   Past Medical History:  Diagnosis Date  . Allergy   . Anxiety and depression 12/19/2009  . Arthritis   . Cancer (HCC)    vaginal  . Constipation   . Degenerative disorder of eye   . Diverticulosis   . Essential hypertension, benign 06/26/2009  . GERD 05/27/2007   takes Nexium daily  . Heart murmur    yrs ago  . History of gout   . History of migraine many yrs ago  . Hyperlipidemia    takes Pravastatin daily  . Hypothyroidism   . Insomnia    but doesn't take any meds  . LOW BACK PAIN 05/27/2007  . Numbness    left toes   . Osteoporosis   . Seizures ([place])    last 1983, none since then- last seizure 1988 per pt     Family History  Problem Relation Age of Onset  . Aortic dissection Father   . Arthritis Mother   . Stroke Mother 49  . Heart murmur Other   . Throat cancer Sister   . Colon cancer Neg Hx   . Esophageal cancer Neg Hx   . Stomach cancer Neg Hx   . Rectal cancer Neg Hx   . Colon polyps Neg Hx     Past Surgical History:  Procedure Laterality Date  . ADENOIDECTOMY    . BACK SURGERY     lumbar x2  . BRONCHIAL BIOPSY  09/12/2019   Procedure: BRONCHIAL BIOPSIES;  Surgeon: Laurin Coder, MD;  Location: [facility];  Service: Endoscopy;;  . BRONCHIAL WASHINGS  09/12/2019   Procedure:  BRONCHIAL WASHINGS;  Surgeon: Laurin Coder, MD;  Location: [facility];  Service: Endoscopy;;  . COLONOSCOPY  2009  . ESOPHAGOGASTRODUODENOSCOPY    . Heel spurs Bilateral   . HEMOSTASIS CONTROL  09/12/2019   Procedure: HEMOSTASIS CONTROL;  Surgeon: Laurin Coder, MD;  [facility];  Service: Endoscopy;;  epi  . KNEE ARTHROPLASTY Left 11/07/2013   Procedure: COMPUTER ASSISTED TOTAL KNEE ARTHROPLASTY;  Surgeon: Marybelle Killings, MD;  Location: [facility];  Service: Orthopedics;  Laterality: Left;  Left Total Knee Arthroplasty, Cemented, Computer Assist  . TONSILLECTOMY    . TOTAL KNEE REVISION Left 09/19/2014   Procedure: LEFT TOTAL KNEE REVISION;  Surgeon: Leandrew Koyanagi, MD;  Location: [facility];  Service: Orthopedics;  Laterality: Left;  . UPPER GASTROINTESTINAL ENDOSCOPY    . VIDEO BRONCHOSCOPY N/A 09/12/2019   Procedure: VIDEO BRONCHOSCOPY WITH FLUORO;  Surgeon: Laurin Coder, MD;  [facility];  Service: Endoscopy;  Laterality: N/A;   Social History   Occupational History  . Occupation: Retired  Tobacco Use  . Smoking status: Former Smoker    Packs/day: 0.25    Years: 20.00    Pack years: 5.00    Types: Cigarettes    Quit date: 09/08/1978    Years since quitting: 41.9  . Smokeless tobacco: Never Used  . Tobacco comment: quit smoking 36yrs ago  Vaping Use  . Vaping Use: Never used  Substance and Sexual Activity  . Alcohol use: No  . Drug use: No  . Sexual activity: Never    Birth control/protection: Post-menopausal

## 2020-08-10 NOTE — Telephone Encounter (Signed)
Patient called requesting a call back. Patient is asking to be seen today by Dr. Erlinda Hong due to fall. She states to be in sever pain or can she see another doctor with an opening today. Please call patient about this matter at [phone].

## 2020-08-10 NOTE — Telephone Encounter (Signed)
Worked in this morning

## 2020-08-11 DIAGNOSIS — F411 Generalized anxiety disorder: Secondary | ICD-10-CM | POA: Insufficient documentation

## 2020-08-11 DIAGNOSIS — G4700 Insomnia, unspecified: Secondary | ICD-10-CM | POA: Insufficient documentation

## 2020-08-11 DIAGNOSIS — F331 Major depressive disorder, recurrent, moderate: Secondary | ICD-10-CM | POA: Insufficient documentation

## 2020-08-11 DIAGNOSIS — G4733 Obstructive sleep apnea (adult) (pediatric): Secondary | ICD-10-CM | POA: Insufficient documentation

## 2020-08-16 DIAGNOSIS — J3081 Allergic rhinitis due to animal (cat) (dog) hair and dander: Secondary | ICD-10-CM | POA: Diagnosis not present

## 2020-08-16 DIAGNOSIS — J3089 Other allergic rhinitis: Secondary | ICD-10-CM | POA: Diagnosis not present

## 2020-08-16 DIAGNOSIS — J301 Allergic rhinitis due to pollen: Secondary | ICD-10-CM | POA: Diagnosis not present

## 2020-08-21 DIAGNOSIS — J3089 Other allergic rhinitis: Secondary | ICD-10-CM | POA: Diagnosis not present

## 2020-08-21 DIAGNOSIS — J3081 Allergic rhinitis due to animal (cat) (dog) hair and dander: Secondary | ICD-10-CM | POA: Diagnosis not present

## 2020-08-21 DIAGNOSIS — J301 Allergic rhinitis due to pollen: Secondary | ICD-10-CM | POA: Diagnosis not present

## 2020-08-27 DIAGNOSIS — J3081 Allergic rhinitis due to animal (cat) (dog) hair and dander: Secondary | ICD-10-CM | POA: Diagnosis not present

## 2020-08-27 DIAGNOSIS — J301 Allergic rhinitis due to pollen: Secondary | ICD-10-CM | POA: Diagnosis not present

## 2020-08-27 DIAGNOSIS — J3089 Other allergic rhinitis: Secondary | ICD-10-CM | POA: Diagnosis not present

## 2020-08-28 DIAGNOSIS — G4733 Obstructive sleep apnea (adult) (pediatric): Secondary | ICD-10-CM | POA: Diagnosis not present

## 2020-09-01 ENCOUNTER — Other Ambulatory Visit: Payer: Self-pay | Admitting: Physician Assistant

## 2020-09-03 DIAGNOSIS — J3081 Allergic rhinitis due to animal (cat) (dog) hair and dander: Secondary | ICD-10-CM | POA: Diagnosis not present

## 2020-09-03 DIAGNOSIS — J3089 Other allergic rhinitis: Secondary | ICD-10-CM | POA: Diagnosis not present

## 2020-09-03 DIAGNOSIS — J301 Allergic rhinitis due to pollen: Secondary | ICD-10-CM | POA: Diagnosis not present

## 2020-09-04 NOTE — Telephone Encounter (Signed)
According to the note should she be taking 2 a day?

## 2020-09-19 ENCOUNTER — Encounter: Payer: Self-pay | Admitting: Family Medicine

## 2020-09-19 ENCOUNTER — Other Ambulatory Visit: Payer: Self-pay

## 2020-09-19 ENCOUNTER — Ambulatory Visit (INDEPENDENT_AMBULATORY_CARE_PROVIDER_SITE_OTHER): Payer: Medicare HMO | Admitting: Family Medicine

## 2020-09-19 VITALS — BP 118/62 | HR 80 | Temp 98.7°F | Ht 65.0 in | Wt 232.8 lb

## 2020-09-19 DIAGNOSIS — I10 Essential (primary) hypertension: Secondary | ICD-10-CM

## 2020-09-19 DIAGNOSIS — M25562 Pain in left knee: Secondary | ICD-10-CM

## 2020-09-19 DIAGNOSIS — M25561 Pain in right knee: Secondary | ICD-10-CM

## 2020-09-19 DIAGNOSIS — R700 Elevated erythrocyte sedimentation rate: Secondary | ICD-10-CM

## 2020-09-19 DIAGNOSIS — G4733 Obstructive sleep apnea (adult) (pediatric): Secondary | ICD-10-CM | POA: Diagnosis not present

## 2020-09-19 DIAGNOSIS — R634 Abnormal weight loss: Secondary | ICD-10-CM | POA: Diagnosis not present

## 2020-09-19 DIAGNOSIS — Z0000 Encounter for general adult medical examination without abnormal findings: Secondary | ICD-10-CM

## 2020-09-19 DIAGNOSIS — F321 Major depressive disorder, single episode, moderate: Secondary | ICD-10-CM

## 2020-09-19 DIAGNOSIS — E890 Postprocedural hypothyroidism: Secondary | ICD-10-CM

## 2020-09-19 DIAGNOSIS — E785 Hyperlipidemia, unspecified: Secondary | ICD-10-CM | POA: Diagnosis not present

## 2020-09-19 DIAGNOSIS — R69 Illness, unspecified: Secondary | ICD-10-CM | POA: Diagnosis not present

## 2020-09-19 DIAGNOSIS — R110 Nausea: Secondary | ICD-10-CM | POA: Diagnosis not present

## 2020-09-19 DIAGNOSIS — E611 Iron deficiency: Secondary | ICD-10-CM | POA: Diagnosis not present

## 2020-09-19 LAB — CBC WITH DIFFERENTIAL/PLATELET
Basophils Absolute: 0 10*3/uL (ref 0.0–0.1)
Basophils Relative: 0.4 % (ref 0.0–3.0)
Eosinophils Absolute: 0 10*3/uL (ref 0.0–0.7)
Eosinophils Relative: 0.2 % (ref 0.0–5.0)
HCT: 41.1 % (ref 36.0–46.0)
Hemoglobin: 13.7 g/dL (ref 12.0–15.0)
Lymphocytes Relative: 21.7 % (ref 12.0–46.0)
Lymphs Abs: 1.9 10*3/uL (ref 0.7–4.0)
MCHC: 33.4 g/dL (ref 30.0–36.0)
MCV: 93.1 fl (ref 78.0–100.0)
Monocytes Absolute: 0.6 10*3/uL (ref 0.1–1.0)
Monocytes Relative: 6.7 % (ref 3.0–12.0)
Neutro Abs: 6.2 10*3/uL (ref 1.4–7.7)
Neutrophils Relative %: 71 % (ref 43.0–77.0)
Platelets: 202 10*3/uL (ref 150.0–400.0)
RBC: 4.41 Mil/uL (ref 3.87–5.11)
RDW: 13.8 % (ref 11.5–15.5)
WBC: 8.7 10*3/uL (ref 4.0–10.5)

## 2020-09-19 LAB — FERRITIN: Ferritin: 68.6 ng/mL (ref 10.0–291.0)

## 2020-09-19 LAB — SEDIMENTATION RATE: Sed Rate: 47 mm/hr — ABNORMAL HIGH (ref 0–30)

## 2020-09-19 NOTE — Progress Notes (Signed)
COURTNY BENNISON DOB: May 11, 1945 Encounter date: 09/19/2020  This is a 75 y.o. female who presents for complete physical   History of present illness/Additional concerns:  She states she had another bad fall - has seen Dr. Erlinda Hong 4/22. Not sure what is causing fall. This last one she went down on both knees. Husband was gone on trip. She joined [employer] but fell there as well. She is afraid of falling. Does get dizzy and light headed. Does feel off balanced. Has done therapy for this in the past as well.   Has been nauseated since getting her last COVID shot. Just seemed it set her back. Hard to eat; just stays nauseated. Portions are very small. Nothing really helps. Even zofran, antacie. Anxiety pill doesn't help either.   Following with neuro: hx of seizures (last 2014) but seen for post concussive sx after fall November 2021. She was not able to tolerate the amitriptyline - nausea was bad with this.  Following with psychiatry: on zoloft 200mg  daily, ativan 0.5mg  prn,  Following with ortho, Dr. Erlinda Hong: for recent fall on knees  Does feel like breathing has been better. Not 100%. Doesn't use inhaler more than twice weekly; does get benefit from this.   Got carpet out of kitchen. Uses cane outside. Does not use walking device inside.  She is getting bone density done this year with Dr. Benjie Karvonen; also does mammograms with her.    Past Medical History:  Diagnosis Date  . Allergy   . Anxiety and depression 12/19/2009  . Arthritis   . Cancer (HCC)    vaginal  . Constipation   . Degenerative disorder of eye   . Diverticulosis   . Essential hypertension, benign 06/26/2009  . GERD 05/27/2007   takes Nexium daily  . Heart murmur    yrs ago  . History of gout   . History of migraine many yrs ago  . Hyperlipidemia    takes Pravastatin daily  . Hypothyroidism   . Insomnia    but doesn't take any meds  . LOW BACK PAIN 05/27/2007  . Numbness    left toes   . Osteoporosis   . Seizures ([place])    last  1983, none since then- last seizure 1988 per pt    Past Surgical History:  Procedure Laterality Date  . ADENOIDECTOMY    . BACK SURGERY     lumbar x2  . BRONCHIAL BIOPSY  09/12/2019   Procedure: BRONCHIAL BIOPSIES;  Surgeon: Laurin Coder, MD;  [facility];  Service: Endoscopy;;  . BRONCHIAL WASHINGS  09/12/2019   Procedure: BRONCHIAL WASHINGS;  Surgeon: Laurin Coder, MD;  [facility];  Service: Endoscopy;;  . COLONOSCOPY  2009  . ESOPHAGOGASTRODUODENOSCOPY    . Heel spurs Bilateral   . HEMOSTASIS CONTROL  09/12/2019   Procedure: HEMOSTASIS CONTROL;  Surgeon: Laurin Coder, MD;  [facility];  Service: Endoscopy;;  epi  . KNEE ARTHROPLASTY Left 11/07/2013   Procedure: COMPUTER ASSISTED TOTAL KNEE ARTHROPLASTY;  Surgeon: Marybelle Killings, MD;  Location: [facility];  Service: Orthopedics;  Laterality: Left;  Left Total Knee Arthroplasty, Cemented, Computer Assist  . TONSILLECTOMY    . TOTAL KNEE REVISION Left 09/19/2014   Procedure: LEFT TOTAL KNEE REVISION;  Surgeon: Leandrew Koyanagi, MD;  Location: [facility];  Service: Orthopedics;  Laterality: Left;  . UPPER GASTROINTESTINAL ENDOSCOPY    . VIDEO BRONCHOSCOPY N/A 09/12/2019   Procedure: VIDEO BRONCHOSCOPY WITH FLUORO;  Surgeon: Sherrilyn Rist  A, MD;  [facility];  Service: Endoscopy;  Laterality: N/A;   Allergies  Allergen Reactions  . Lamotrigine Swelling    Tongue swelling  . Bupropion Other (See Comments)    not known  . Carbamazepine Other (See Comments)    Hypnatremia  . Levetiracetam     Other reaction(s): Dizziness (intolerance)  . Tramadol Other (See Comments)    not known  . Adhesive [Tape]     ?  Blister after knee surgery  . Amitriptyline     nausea  . Clarithromycin     Unsure of reaction    . Doxycycline     Interfered with seizure medication  . Nickel     Had to remove knee implant with nickel and replace it  . Other     Metal and perfumes  . Topamax [Topiramate]  Nausea And Vomiting  . Estradiol Rash  . Latex Rash  . Phenytoin Sodium Extended Other (See Comments)    drowsiness   Current Meds  Medication Sig  . albuterol (VENTOLIN HFA) 108 (90 Base) MCG/ACT inhaler Inhale 1-2 puffs into the lungs every 6 (six) hours as needed for wheezing or shortness of breath.  Marland Kitchen b complex vitamins tablet Take 1 tablet by mouth daily.  . beta carotene w/minerals (OCUVITE) tablet Take 1 tablet by mouth daily.  . Cholecalciferol (VITAMIN D3 PO) Take by mouth.  . cyclobenzaprine (FLEXERIL) 10 MG tablet Take 1 tablet (10 mg total) by mouth 2 (two) times daily as needed for muscle spasms.  Marland Kitchen EPINEPHrine 0.3 mg/0.3 mL IJ SOAJ injection SMARTSIG:Injection As Directed  . esomeprazole (NEXIUM) 20 MG capsule Take 2 capsules (40 mg total) by mouth daily before breakfast. (Patient taking differently: Take 20 mg by mouth daily before breakfast.)  . furosemide (LASIX) 20 MG tablet TAKE 1 TABLET BY MOUTH IN THE MORNING  . hyoscyamine (LEVSIN SL) 0.125 MG SL tablet Place 1-2 tablets (0.125-0.25 mg total) under the tongue every 6 (six) hours as needed.  Marland Kitchen levocetirizine (XYZAL) 5 MG tablet Take 5 mg by mouth daily.   Marland Kitchen levothyroxine (SYNTHROID) 88 MCG tablet TAKE 1 TABLET BY MOUTH EVERY DAY  . lisinopril (ZESTRIL) 5 MG tablet TAKE 1 TABLET BY MOUTH EVERY DAY  . LORazepam (ATIVAN) 0.5 MG tablet Take 1 tablet (0.5 mg total) by mouth 4 (four) times daily as needed for anxiety.  . ondansetron (ZOFRAN ODT) 4 MG disintegrating tablet Take 1 tablet (4 mg total) by mouth every 6 (six) hours as needed for nausea or vomiting.  Marland Kitchen oxybutynin (DITROPAN-XL) 10 MG 24 hr tablet Take 10 mg by mouth daily.  . pravastatin (PRAVACHOL) 20 MG tablet TAKE 1 TABLET (20 MG TOTAL) BY MOUTH DAILY. TAKE 1 TABLET BY MOUTH EVERY DAY  . sertraline (ZOLOFT) 100 MG tablet Take 2 tablets (200 mg total) by mouth daily.  Marland Kitchen Spacer/Aero-Holding Chambers (AEROCHAMBER PLUS) inhaler Use as instructed  .  Spacer/Aero-Holding Dorise Bullion Use as directed with inhaler  . zonisamide (ZONEGRAN) 100 MG capsule Take 2 capsules twice a day  . [DISCONTINUED] amitriptyline (ELAVIL) 10 MG tablet Take 1 tablet every night  . [DISCONTINUED] amoxicillin-clavulanate (AUGMENTIN) 875-125 MG tablet Take 1 tablet by mouth every 12 (twelve) hours.  . [DISCONTINUED] HYDROcodone-acetaminophen (NORCO/VICODIN) 5-325 MG tablet Take 1-2 tablets by mouth every 6 (six) hours as needed.   Social History   Tobacco Use  . Smoking status: Former Smoker    Packs/day: 0.25    Years: 20.00    Pack  years: 5.00    Types: Cigarettes    Quit date: 09/08/1978    Years since quitting: 42.0  . Smokeless tobacco: Never Used  . Tobacco comment: quit smoking 34yrs ago  Substance Use Topics  . Alcohol use: No   Family History  Problem Relation Age of Onset  . Aortic dissection Father   . Arthritis Mother   . Stroke Mother 74  . Heart murmur Other   . Throat cancer Sister   . Colon cancer Neg Hx   . Esophageal cancer Neg Hx   . Stomach cancer Neg Hx   . Rectal cancer Neg Hx   . Colon polyps Neg Hx      Review of Systems  Constitutional: Negative for chills, fatigue and fever.  Respiratory: Negative for cough, chest tightness, shortness of breath and wheezing.   Cardiovascular: Negative for chest pain, palpitations and leg swelling.  Skin: Negative for pallor.    CBC:  Lab Results  Component Value Date   WBC 15.7 (H) 05/11/2020   HGB 13.9 05/11/2020   HCT 43.1 05/11/2020   MCH 30.0 05/11/2020   MCHC 32.3 05/11/2020   RDW 13.7 05/11/2020   PLT 224 05/11/2020   MPV 11.1 03/19/2020   CMP: Lab Results  Component Value Date   NA 142 05/11/2020   NA 142 11/12/2015   K 3.8 05/11/2020   CL 107 05/11/2020   CO2 26 05/11/2020   ANIONGAP 9 05/11/2020   GLUCOSE 135 (H) 05/11/2020   BUN 11 05/11/2020   BUN 8 11/12/2015   CREATININE 1.01 (H) 05/11/2020   CREATININE 0.72 03/19/2020   GFRAA >60 12/20/2019    GFRAA >89 10/05/2015   CALCIUM 9.4 05/11/2020   PROT 7.4 05/11/2020   BILITOT 0.6 05/11/2020   ALKPHOS 84 05/11/2020   ALT 13 05/11/2020   AST 21 05/11/2020   LIPID: Lab Results  Component Value Date   CHOL 172 03/19/2020   TRIG 123 03/19/2020   HDL 50 03/19/2020   LDLCALC 100 (H) 03/19/2020    Objective:  BP 118/62 (BP Location: Left Arm, Patient Position: Sitting, Cuff Size: Large)   Pulse 80   Temp 98.7 F (37.1 C) (Oral)   Ht 5\' 5"  (1.651 m)   Wt 232 lb 12.8 oz (105.6 kg)   SpO2 95%   BMI 38.74 kg/m   Weight: 232 lb 12.8 oz (105.6 kg)   BP Readings from Last 3 Encounters:  09/19/20 118/62  07/25/20 134/73  05/15/20 (!) 142/74   Wt Readings from Last 3 Encounters:  09/19/20 232 lb 12.8 oz (105.6 kg)  07/25/20 238 lb (108 kg)  05/15/20 241 lb 9.6 oz (109.6 kg)    Physical Exam Constitutional:      General: She is not in acute distress.    Appearance: She is well-developed.  Cardiovascular:     Rate and Rhythm: Normal rate and regular rhythm.     Heart sounds: Normal heart sounds. No murmur heard. No friction rub.  Pulmonary:     Effort: Pulmonary effort is normal. No respiratory distress.     Breath sounds: Normal breath sounds. No wheezing or rales.  Musculoskeletal:     Right lower leg: No edema.     Left lower leg: No edema.  Skin:    Coloration: Skin is pale.  Neurological:     Mental Status: She is alert and oriented to person, place, and time.  Psychiatric:        Behavior: Behavior normal.  Assessment/Plan: Health Maintenance Due  Topic Date Due  . Zoster Vaccines- Shingrix (1 of 2) Never done   Health Maintenance reviewed.  1. Preventative health care Patient just completed 4th COVID; can consider shingrix in future.   2. Essential hypertension Well controlled. Continue lisinopril 5mg  daily.  - CBC with Differential/Platelet; Future - Comprehensive metabolic panel; Future - Comprehensive metabolic panel - CBC with  Differential/Platelet  3. Postablative hypothyroidism Continue with synthroid 70mcg daily. Has been well controlled.   4. Hyperlipidemia, unspecified hyperlipidemia type Continue pravastatin 20mg  daily. Has been well controlled.   5. Depression, major, single episode, moderate (HCC) Continue with zoloft 200mg  daily; she does see Donnal Moat. We discussed that she has not felt well for years. I do think that it is worthwhile to consider minimizing medications/doses to minimize potential side effects. Encouraged her to consider this and we can continue to review med list.   6. Obstructive sleep apnea Continue with cpap; encouraged her to continue to discuss equipment with specialist to make sure fit is comfortable and that she is benefiting.   7. Iron deficiency Recheck levels. She has been supplementing iron regularly; we discussed that if stores are good we will cut out supplementation. She didn't feel like this contributed to nausea but I would like to cut out anything unnecessary that may be issue.  - Ferritin; Future - Ferritin  8. Weight loss Secondary to nausea. See below. I am going to reach back out to GI for suggestions. - Prealbumin  9. Nausea Ongoing problem for her and getting more intense. She has had full evaluation for this in the past, but no causes revealed. She does not get relief from medications at this point. She has been losing weight.  - Lipase; Future - Lipase  10. Pain in both knees, unspecified chronicity She is following with ortho. This is improving since fall, but she is still having pain and tenderness of both knees.   11. Elevated sed rate Nonspecific elevation. She did follow up with rheumatology per my request but they did not feel she had rheumatologic issue. We will recheck for comparison today. - Sedimentation rate; Future - Sedimentation rate  Return for Chronic condition visit.  Micheline Rough, MD

## 2020-09-20 LAB — COMPREHENSIVE METABOLIC PANEL
ALT: 11 U/L (ref 0–35)
AST: 17 U/L (ref 0–37)
Albumin: 3.9 g/dL (ref 3.5–5.2)
Alkaline Phosphatase: 94 U/L (ref 39–117)
BUN: 9 mg/dL (ref 6–23)
CO2: 23 mEq/L (ref 19–32)
Calcium: 9.1 mg/dL (ref 8.4–10.5)
Chloride: 105 mEq/L (ref 96–112)
Creatinine, Ser: 0.9 mg/dL (ref 0.40–1.20)
GFR: 63.82 mL/min (ref >60.00)
Glucose, Bld: 108 mg/dL — ABNORMAL HIGH (ref 70–99)
Potassium: 4.3 mEq/L (ref 3.5–5.1)
Sodium: 140 mEq/L (ref 135–145)
Total Bilirubin: 0.4 mg/dL (ref 0.2–1.2)
Total Protein: 6.9 g/dL (ref 6.0–8.3)

## 2020-09-20 LAB — PREALBUMIN: Prealbumin: 18 mg/dL (ref 17–34)

## 2020-09-21 DIAGNOSIS — J301 Allergic rhinitis due to pollen: Secondary | ICD-10-CM | POA: Diagnosis not present

## 2020-09-21 DIAGNOSIS — J3089 Other allergic rhinitis: Secondary | ICD-10-CM | POA: Diagnosis not present

## 2020-09-21 DIAGNOSIS — J3081 Allergic rhinitis due to animal (cat) (dog) hair and dander: Secondary | ICD-10-CM | POA: Diagnosis not present

## 2020-09-21 LAB — LIPASE: Lipase: 15 U/L (ref 11.0–59.0)

## 2020-09-28 DIAGNOSIS — G4733 Obstructive sleep apnea (adult) (pediatric): Secondary | ICD-10-CM | POA: Diagnosis not present

## 2020-10-01 ENCOUNTER — Telehealth: Payer: Self-pay

## 2020-10-01 DIAGNOSIS — J301 Allergic rhinitis due to pollen: Secondary | ICD-10-CM | POA: Diagnosis not present

## 2020-10-01 DIAGNOSIS — J3089 Other allergic rhinitis: Secondary | ICD-10-CM | POA: Diagnosis not present

## 2020-10-01 DIAGNOSIS — J3081 Allergic rhinitis due to animal (cat) (dog) hair and dander: Secondary | ICD-10-CM | POA: Diagnosis not present

## 2020-10-01 NOTE — Telephone Encounter (Signed)
Noted, thank you

## 2020-10-01 NOTE — Telephone Encounter (Signed)
-----   Message from Yetta Flock, MD sent at 09/30/2020  8:22 PM EDT ----- Thanks for the note. Difficult situation. She has had chronic nausea for years, extensive workup with endoscopy, imaging, GES, brain MRI, labs, etc. Yes if there is any way we can cut back on her medications hopefully that would help. I am happy to see her back in the office for a follow up to discuss other options.   Catalina Salasar can you please book her for an office follow up with me? Thanks  ----- Message ----- From: Caren Macadam, MD Sent: 09/22/2020  12:09 PM EDT To: Yetta Flock, MD  Just wanted to let you know that her nausea seems to be getting worse. She is having a hard time eating through day; nausea doesn't seem to let up for her. I am going to work to see what we can do to cut back on medications that could contribute, but wanted you to be in the loop in case you thought she needed more evaluation. She doesn't feel like anything relieves her nausea at this point.

## 2020-10-01 NOTE — Telephone Encounter (Signed)
Patient returned call. Scheduled appointment for 11/30/20

## 2020-10-01 NOTE — Telephone Encounter (Signed)
Lm on vm for patient to return call to schedule an appt with Dr. Havery Moros.

## 2020-10-08 ENCOUNTER — Ambulatory Visit: Payer: Medicare HMO | Admitting: Pulmonary Disease

## 2020-10-08 ENCOUNTER — Encounter: Payer: Self-pay | Admitting: Pulmonary Disease

## 2020-10-08 ENCOUNTER — Other Ambulatory Visit: Payer: Self-pay

## 2020-10-08 VITALS — BP 118/66 | HR 71 | Temp 98.1°F | Ht 65.0 in | Wt 232.8 lb

## 2020-10-08 DIAGNOSIS — G4733 Obstructive sleep apnea (adult) (pediatric): Secondary | ICD-10-CM

## 2020-10-08 DIAGNOSIS — Z9989 Dependence on other enabling machines and devices: Secondary | ICD-10-CM | POA: Diagnosis not present

## 2020-10-08 DIAGNOSIS — R0602 Shortness of breath: Secondary | ICD-10-CM

## 2020-10-08 NOTE — Progress Notes (Signed)
Beverly Ballard    [id number]    [birth year]  Primary Care Physician:Koberlein, Steele Berg, MD  Referring Physician: Caren Macadam, MD [place],  [place] [id number]  Chief complaint:    Breathing feels a little bit better generally History of hypersensitivity pneumonitis Obstructive sleep apnea Shortness of breath   HPI:  Breathing feels a little bit better  Was diagnosed with hypersensitive pneumonitis, treated with steroids Have to be weaned off the steroids as she felt it was not helping  Has felt better over the last few months  Uses CPAP regularly  Usually able to sleep well through the night On some days, she wakes up frequently  Has not a recent fall Did benefit from physical therapy  She did sound a little bit better today compared to the last time I saw her She remains short of breath  Has been very compliant with CPAP use  On antidepressants-on Zoloft  She feels she does not have any other social pressures that may be contributing to her just feeling poorly generally  She does have a history of chronic shortness of breath Chronic back pain-on Flexeril She is a couple of back surgeries in the past Chronic knee pain  Recently had a bronchoscopy-increased neutrophils on BAL, transbronchial biopsies were mainly bronchial tissue  She did smoke in the past-claims never to be a heavy smoker She does have a history of allergies for which she receives allergy shots  Pets: No pets Occupation: Worked retail Exposures: No mold exposure Smoking history: Quit smoking many years ago Travel history: Visits to [place] frequently Relevant family history: No contributory family history of lung disease  Outpatient Encounter Medications as of 10/08/2020  Medication Sig   albuterol (VENTOLIN HFA) 108 (90 Base) MCG/ACT inhaler Inhale 1-2 puffs into the lungs every 6 (six) hours as needed for wheezing or shortness of breath.   b complex  vitamins tablet Take 1 tablet by mouth daily.   beta carotene w/minerals (OCUVITE) tablet Take 1 tablet by mouth daily.   Cholecalciferol (VITAMIN D3 PO) Take by mouth.   cyclobenzaprine (FLEXERIL) 10 MG tablet Take 1 tablet (10 mg total) by mouth 2 (two) times daily as needed for muscle spasms.   EPINEPHrine 0.3 mg/0.3 mL IJ SOAJ injection SMARTSIG:Injection As Directed   esomeprazole (NEXIUM) 20 MG capsule Take 2 capsules (40 mg total) by mouth daily before breakfast. (Patient taking differently: Take 20 mg by mouth daily before breakfast.)   furosemide (LASIX) 20 MG tablet TAKE 1 TABLET BY MOUTH IN THE MORNING   hyoscyamine (LEVSIN SL) 0.125 MG SL tablet Place 1-2 tablets (0.125-0.25 mg total) under the tongue every 6 (six) hours as needed.   levocetirizine (XYZAL) 5 MG tablet Take 5 mg by mouth daily.    levothyroxine (SYNTHROID) 88 MCG tablet TAKE 1 TABLET BY MOUTH EVERY DAY   lisinopril (ZESTRIL) 5 MG tablet TAKE 1 TABLET BY MOUTH EVERY DAY   LORazepam (ATIVAN) 0.5 MG tablet Take 1 tablet (0.5 mg total) by mouth 4 (four) times daily as needed for anxiety.   ondansetron (ZOFRAN ODT) 4 MG disintegrating tablet Take 1 tablet (4 mg total) by mouth every 6 (six) hours as needed for nausea or vomiting.   oxybutynin (DITROPAN-XL) 10 MG 24 hr tablet Take 10 mg by mouth daily.   pravastatin (PRAVACHOL) 20 MG tablet TAKE 1 TABLET (20 MG TOTAL) BY MOUTH DAILY. TAKE 1 TABLET BY MOUTH EVERY DAY  sertraline (ZOLOFT) 100 MG tablet Take 2 tablets (200 mg total) by mouth daily.   Spacer/Aero-Holding Chambers (AEROCHAMBER PLUS) inhaler Use as instructed   Spacer/Aero-Holding Dorise Bullion Use as directed with inhaler   zonisamide (ZONEGRAN) 100 MG capsule Take 2 capsules twice a day   No facility-administered encounter medications on file as of 10/08/2020.    Allergies as of 10/08/2020 - Review Complete 10/08/2020  Allergen Reaction Noted   Lamotrigine Swelling 07/03/2015   Bupropion Other (See  Comments) 05/02/2016   Carbamazepine Other (See Comments) 07/03/2015   Levetiracetam  07/03/2015   Tramadol Other (See Comments) 05/02/2016   Adhesive [tape]  09/08/2014   Amitriptyline  09/19/2020   Clarithromycin     Doxycycline     Nickel  08/11/2014   Other  08/11/2014   Topamax [topiramate] Nausea And Vomiting 12/05/2019   Estradiol Rash 07/03/2015   Latex Rash 01/28/2018   Phenytoin sodium extended Other (See Comments) 07/03/2015    Past Medical History:  Diagnosis Date   Allergy    Anxiety and depression 12/19/2009   Arthritis    Cancer ([place])    vaginal   Constipation    Degenerative disorder of eye    Diverticulosis    Essential hypertension, benign 06/26/2009   GERD 05/27/2007   takes Nexium daily   Heart murmur    yrs ago   History of gout    History of migraine many yrs ago   Hyperlipidemia    takes Pravastatin daily   Hypothyroidism    Insomnia    but doesn't take any meds   LOW BACK PAIN 05/27/2007   Numbness    left toes    Osteoporosis    Seizures ([place])    last 1983, none since then- last seizure 1988 per pt     Past Surgical History:  Procedure Laterality Date   ADENOIDECTOMY     BACK SURGERY     lumbar x2   BRONCHIAL BIOPSY  09/12/2019   Procedure: BRONCHIAL BIOPSIES;  Surgeon: Laurin Coder, MD;  [facility]: [facility];  Service: Endoscopy;;   BRONCHIAL WASHINGS  09/12/2019   Procedure: BRONCHIAL WASHINGS;  Surgeon: Laurin Coder, MD;  [facility];  Service: Endoscopy;;   COLONOSCOPY  2009   ESOPHAGOGASTRODUODENOSCOPY     Heel spurs Bilateral    HEMOSTASIS CONTROL  09/12/2019   Procedure: HEMOSTASIS CONTROL;  Surgeon: Laurin Coder, MD;  [facility];  Service: Endoscopy;;  epi   KNEE ARTHROPLASTY Left 11/07/2013   Procedure: COMPUTER ASSISTED TOTAL KNEE ARTHROPLASTY;  Surgeon: Marybelle Killings, MD;  Location: [facility];  Service: Orthopedics;  Laterality: Left;  Left Total Knee Arthroplasty, Cemented, Computer Assist    TONSILLECTOMY     TOTAL KNEE REVISION Left 09/19/2014   Procedure: LEFT TOTAL KNEE REVISION;  Surgeon: Leandrew Koyanagi, MD;  Location: [facility];  Service: Orthopedics;  Laterality: Left;   UPPER GASTROINTESTINAL ENDOSCOPY     VIDEO BRONCHOSCOPY N/A 09/12/2019   Procedure: VIDEO BRONCHOSCOPY WITH FLUORO;  Surgeon: Laurin Coder, MD;  [facility];  Service: Endoscopy;  Laterality: N/A;    Family History  Problem Relation Age of Onset   Aortic dissection Father    Arthritis Mother    Stroke Mother 38   Heart murmur Other    Throat cancer Sister    Colon cancer Neg Hx    Esophageal cancer Neg Hx    Stomach cancer Neg Hx    Rectal cancer Neg Hx  Colon polyps Neg Hx     Social History   Socioeconomic History   Marital status: Married    Spouse name: Not on file   Number of children: 1   Years of education: Not on file   Highest education level: Not on file  Occupational History   Occupation: Retired  Tobacco Use   Smoking status: Former    Packs/day: 0.25    Years: 20.00    Pack years: 5.00    Types: Cigarettes    Quit date: 09/08/1978    Years since quitting: 42.1   Smokeless tobacco: Never   Tobacco comments:    quit smoking 80yr ago  Vaping Use   Vaping Use: Never used  Substance and Sexual Activity   Alcohol use: No   Drug use: No   Sexual activity: Never    Birth control/protection: Post-menopausal  Other Topics Concern   Not on file  Social History Narrative   Right handed    Lives with husband    Social Determinants of Health   Financial Resource Strain: Low Risk    Difficulty of Paying Living Expenses: Not hard at all  Food Insecurity: No Food Insecurity   Worried About RCharity fundraiserin the Last Year: Never true   [place] the Last Year: Never true  Transportation Needs: No Transportation Needs   Lack of Transportation (Medical): No   Lack of Transportation (Non-Medical): No  Physical Activity: Inactive   Days of Exercise  per Week: 0 days   Minutes of Exercise per Session: 0 min  Stress: Stress Concern Present   Feeling of Stress : To some extent  Social Connections: Socially Isolated   Frequency of Communication with Friends and Family: Twice a week   Frequency of Social Gatherings with Friends and Family: Never   Attends Religious Services: Never   APrintmaker No   Attends CMusic therapist Never   Marital Status: Married  IHuman resources officerViolence: Not At Risk   Fear of Current or Ex-Partner: No   Emotionally Abused: No   Physically Abused: No   Sexually Abused: No    Review of Systems  Constitutional:  Positive for fatigue.  HENT:  Negative for postnasal drip and rhinorrhea.   Eyes: Negative.   Respiratory:  Positive for apnea and shortness of breath. Negative for cough.   Cardiovascular: Negative.   Gastrointestinal: Negative.   Endocrine: Negative.   Genitourinary: Negative.   Musculoskeletal:  Positive for arthralgias and back pain.       Knee pain  Allergic/Immunologic: Positive for environmental allergies.  Neurological: Negative.   Hematological: Negative.   Psychiatric/Behavioral:  Positive for dysphoric mood and sleep disturbance.   All other systems reviewed and are negative.  Vitals:   10/08/20 1206  BP: 118/66  Pulse: 71  Temp: 98.1 F (36.7 C)  SpO2: 96%     Physical Exam Constitutional:      General: She is not in acute distress.    Appearance: She is well-developed. She is obese. She is not diaphoretic.  HENT:     Head: Normocephalic and atraumatic.     Nose: No congestion.     Mouth/Throat:     Mouth: Mucous membranes are moist.  Eyes:     General:        Right eye: No discharge.        Left eye: No discharge.  Neck:     Thyroid:  No thyromegaly.     Trachea: No tracheal deviation.  Cardiovascular:     Rate and Rhythm: Normal rate and regular rhythm.     Heart sounds: No murmur heard.   No friction rub.   Pulmonary:     Effort: Pulmonary effort is normal. No respiratory distress.     Breath sounds: No stridor. No wheezing or rhonchi.  Musculoskeletal:     Cervical back: No rigidity or tenderness.     Right lower leg: Edema present.     Left lower leg: Edema present.  Neurological:     General: No focal deficit present.     Mental Status: She is alert.  Psychiatric:        Mood and Affect: Mood normal.  Data reviewed:  Compliance data shows 100% compliance with CPAP CPAP set between 5 and 15 Residual AHI 1.6  Recent echocardiogram reviewed showing diastolic dysfunction, normal ejection fraction 10/18/2019  Recent CT scan of the chest reviewed-12/21/2019-scarring upper lobes unchanged-reviewed by myself  Assessment:  Chronic shortness of breath-multifactorial -Multifactorial shortness of breath -Deconditioning -Musculoskeletal pain on may be contributing   Chronic hypersensitivity pneumonitis -She is off steroids at present -Breathing continues to be stable  Significant back pain and knee pain  -Chronic symptoms -Appears to be stable at present  Previous smoker, significant exposure to secondhand smoke in the past-no underlying lung disease known -PFT does not reveal any significant obstruction -No significant need to repeat PFT at present  History of allergies for which she receives allergy shots  Obstructive sleep apnea -Compliant with CPAP use  Plan/Recommendations:  Continue albuterol as needed -Continue CPAP use on a regular basis  Importance of graded exercises was discussed  I will see her back in about 3 months  Encouraged to call with any significant concerns  Follow-up in 3 months  Sherrilyn Rist MD [facility] 10/08/2020, 12:15 PM  CC: Caren Macadam, MD

## 2020-10-08 NOTE — Patient Instructions (Signed)
Graded exercises as tolerated  Continue using CPAP on a regular basis  I will see you in 3 months from now -Call with any significant concerns

## 2020-10-10 ENCOUNTER — Encounter: Payer: Self-pay | Admitting: Internal Medicine

## 2020-10-10 ENCOUNTER — Ambulatory Visit (INDEPENDENT_AMBULATORY_CARE_PROVIDER_SITE_OTHER): Payer: Medicare HMO | Admitting: Internal Medicine

## 2020-10-10 ENCOUNTER — Other Ambulatory Visit: Payer: Self-pay

## 2020-10-10 VITALS — BP 128/72 | HR 75 | Ht 65.0 in | Wt 233.0 lb

## 2020-10-10 DIAGNOSIS — E890 Postprocedural hypothyroidism: Secondary | ICD-10-CM

## 2020-10-10 DIAGNOSIS — J3081 Allergic rhinitis due to animal (cat) (dog) hair and dander: Secondary | ICD-10-CM | POA: Diagnosis not present

## 2020-10-10 DIAGNOSIS — J301 Allergic rhinitis due to pollen: Secondary | ICD-10-CM | POA: Diagnosis not present

## 2020-10-10 DIAGNOSIS — J3089 Other allergic rhinitis: Secondary | ICD-10-CM | POA: Diagnosis not present

## 2020-10-10 LAB — T4, FREE: Free T4: 0.77 ng/dL (ref 0.60–1.60)

## 2020-10-10 LAB — TSH: TSH: 1.63 u[IU]/mL (ref 0.35–4.50)

## 2020-10-10 NOTE — Progress Notes (Signed)
Patient ID: Beverly Ballard, female   DOB: 03/27/1946, 75 y.o.   MRN: [id number]   This visit occurred during the SARS-CoV-2 public health emergency.  Safety protocols were in place, including screening questions prior to the visit, additional usage of staff PPE, and extensive cleaning of exam room while observing appropriate contact time as indicated for disinfecting solutions.   HPI  Beverly Ballard is a 75 y.o.-year-old female, returning for follow-up for  uncontrolled post ablative hypothyroidism.  Last visit 1 year and 3 months ago. She saw Dr. Wilson Singer before, but came to see me after he retired.    Interim history: She had 2 falls. No fractures. She was also dx'ed with Ulcerative Colitis since last OV. No steroids. Dr. Havery Moros. Her tremors are a little worse.  Reviewed history: Patient was diagnosed with hypothyroidism in the 80s after RAI treatment for Graves' disease.  She was initially started on Synthroid, now on levothyroxine.  She has been on high doses of levothyroxine, even 150 mcg for several years before she started to see Dr. Maudie Mercury, who has been trying to reduce her levothyroxine dose to improve her TFTs to the normal range.  However, since TFTs were fluctuating, she was referred to endocrinology.  She was prev. on 100 mcg levothyroxine daily.  After we optimized how she was taking the medication (see below), her TSH became suppressed again >> dose decreased to 88 mcg daily. Subsequent TFTs were normal.  Patient continues on levothyroxine 88 mcg daily: - in am - fasting - at least 1h from b'fast - no Ca, MVI - on PPIs >4h after LT4 -+ Super B complex with 300 mcg Biotin - last dose last night On B12 now every other day.  Reviewed her TFTs: Lab Results  Component Value Date   TSH 2.14 03/19/2020   TSH 2.23 10/12/2019   TSH 3.34 06/24/2019   TSH 1.46 12/30/2018   TSH 1.46 04/28/2018   TSH 1.04 03/15/2018   TSH 0.58 11/04/2017   TSH 0.09 (L) 09/16/2017   TSH 1.25  04/16/2017   TSH 0.18 (L) 02/06/2017   FREET4 1.0 03/19/2020   FREET4 0.95 10/12/2019   FREET4 0.91 04/28/2018   FREET4 0.77 11/04/2017   FREET4 0.89 09/16/2017   FREET4 0.77 04/16/2017   FREET4 0.87 02/10/2017    Pt denies: - feeling nodules in neck - hoarseness - dysphagia - choking - SOB with lying down  She has no FH of thyroid disorders. + FH of thyroid cancer (half sister by mother). No FH of thyroid cancer. No h/o radiation tx to head or neck except RAI treatment in the 80s.  No herbal supplements. No Biotin use. No recent steroids use.   Pt. also has a history of epilepsy ( last seizure 1988), HTN, HL, GERD, anxiety + depression - on Zoloft She also has a history of low abdominal pain that has been extensively investigated in the past >> now dx'ed with UC.  She is seeing Dr. Havery Moros.  Her mother lives in [place].  ROS: Constitutional: no weight gain/no weight loss, no fatigue, no subjective hyperthermia, no subjective hypothermia Eyes: no blurry vision, no xerophthalmia ENT:  + see HPI Cardiovascular: no CP/no SOB/no palpitations/no leg swelling Respiratory: no cough/no SOB/no wheezing Gastrointestinal: no N/no V/no D/no C/no acid reflux Musculoskeletal: no muscle aches/no joint aches Skin: no rashes, no hair loss Neurological: + Tremors/no numbness/no tingling/no dizziness  I reviewed pt's medications, allergies, PMH, social hx, family hx, and changes were documented in  the history of present illness. Otherwise, unchanged from my initial visit note.  Past Medical History:  Diagnosis Date   Allergy    Anxiety and depression 12/19/2009   Arthritis    Cancer ([place])    vaginal   Constipation    Degenerative disorder of eye    Diverticulosis    Essential hypertension, benign 06/26/2009   GERD 05/27/2007   takes Nexium daily   Heart murmur    yrs ago   History of gout    History of migraine many yrs ago   Hyperlipidemia    takes Pravastatin daily    Hypothyroidism    Insomnia    but doesn't take any meds   LOW BACK PAIN 05/27/2007   Numbness    left toes    Osteoporosis    Seizures ([place])    last 1983, none since then- last seizure 1988 per pt    Past Surgical History:  Procedure Laterality Date   ADENOIDECTOMY     BACK SURGERY     lumbar x2   BRONCHIAL BIOPSY  09/12/2019   Procedure: BRONCHIAL BIOPSIES;  Surgeon: Laurin Coder, MD;  [facility];  Service: Endoscopy;;   BRONCHIAL WASHINGS  09/12/2019   Procedure: BRONCHIAL WASHINGS;  Surgeon: Laurin Coder, MD;  [facility];  Service: Endoscopy;;   COLONOSCOPY  2009   ESOPHAGOGASTRODUODENOSCOPY     Heel spurs Bilateral    HEMOSTASIS CONTROL  09/12/2019   Procedure: HEMOSTASIS CONTROL;  Surgeon: Laurin Coder, MD;  [facility];  Service: Endoscopy;;  epi   KNEE ARTHROPLASTY Left 11/07/2013   Procedure: COMPUTER ASSISTED TOTAL KNEE ARTHROPLASTY;  Surgeon: Marybelle Killings, MD;  Location: [facility];  Service: Orthopedics;  Laterality: Left;  Left Total Knee Arthroplasty, Cemented, Computer Assist   TONSILLECTOMY     TOTAL KNEE REVISION Left 09/19/2014   Procedure: LEFT TOTAL KNEE REVISION;  Surgeon: Leandrew Koyanagi, MD;  Location: [facility];  Service: Orthopedics;  Laterality: Left;   UPPER GASTROINTESTINAL ENDOSCOPY     VIDEO BRONCHOSCOPY N/A 09/12/2019   Procedure: VIDEO BRONCHOSCOPY WITH FLUORO;  Surgeon: Laurin Coder, MD;  [facility];  Service: Endoscopy;  Laterality: N/A;   Social History   Socioeconomic History   Marital status: Married    Spouse name: Not on file   Number of children: 1   Years of education: Not on file   Highest education level: Not on file  Occupational History   Occupation: Retired  Tobacco Use   Smoking status: Former    Packs/day: 0.25    Years: 20.00    Pack years: 5.00    Types: Cigarettes    Quit date: 09/08/1978    Years since quitting: 42.1   Smokeless tobacco: Never   Tobacco comments:     quit smoking 84yrs ago  Vaping Use   Vaping Use: Never used  Substance and Sexual Activity   Alcohol use: No   Drug use: No   Sexual activity: Never    Birth control/protection: Post-menopausal  Other Topics Concern   Not on file  Social History Narrative   Right handed    Lives with husband    Social Determinants of Health   Financial Resource Strain: Low Risk    Difficulty of Paying Living Expenses: Not hard at all  Food Insecurity: No Food Insecurity   Worried About Charity fundraiser in the Last Year: Never true   [place] in the  Last Year: Never true  Transportation Needs: No Transportation Needs   Lack of Transportation (Medical): No   Lack of Transportation (Non-Medical): No  Physical Activity: Inactive   Days of Exercise per Week: 0 days   Minutes of Exercise per Session: 0 min  Stress: Stress Concern Present   Feeling of Stress : To some extent  Social Connections: Socially Isolated   Frequency of Communication with Friends and Family: Twice a week   Frequency of Social Gatherings with Friends and Family: Never   Attends Religious Services: Never   Printmaker: No   Attends Music therapist: Never   Marital Status: Married  Human resources officer Violence: Not At Risk   Fear of Current or Ex-Partner: No   Emotionally Abused: No   Physically Abused: No   Sexually Abused: No   Current Outpatient Medications on File Prior to Visit  Medication Sig Dispense Refill   albuterol (VENTOLIN HFA) 108 (90 Base) MCG/ACT inhaler Inhale 1-2 puffs into the lungs every 6 (six) hours as needed for wheezing or shortness of breath. 8 g 0   b complex vitamins tablet Take 1 tablet by mouth daily.     beta carotene w/minerals (OCUVITE) tablet Take 1 tablet by mouth daily.     Cholecalciferol (VITAMIN D3 PO) Take by mouth.     cyclobenzaprine (FLEXERIL) 10 MG tablet Take 1 tablet (10 mg total) by mouth 2 (two) times daily as needed for  muscle spasms. 30 tablet 0   EPINEPHrine 0.3 mg/0.3 mL IJ SOAJ injection SMARTSIG:Injection As Directed     esomeprazole (NEXIUM) 20 MG capsule Take 2 capsules (40 mg total) by mouth daily before breakfast. (Patient taking differently: Take 20 mg by mouth daily before breakfast.) 2 capsule 0   furosemide (LASIX) 20 MG tablet TAKE 1 TABLET BY MOUTH IN THE MORNING 30 tablet 1   hyoscyamine (LEVSIN SL) 0.125 MG SL tablet Place 1-2 tablets (0.125-0.25 mg total) under the tongue every 6 (six) hours as needed. 30 tablet 1   levocetirizine (XYZAL) 5 MG tablet Take 5 mg by mouth daily.      levothyroxine (SYNTHROID) 88 MCG tablet TAKE 1 TABLET BY MOUTH EVERY DAY 90 tablet 0   lisinopril (ZESTRIL) 5 MG tablet TAKE 1 TABLET BY MOUTH EVERY DAY 90 tablet 1   LORazepam (ATIVAN) 0.5 MG tablet Take 1 tablet (0.5 mg total) by mouth 4 (four) times daily as needed for anxiety. 60 tablet 0   ondansetron (ZOFRAN ODT) 4 MG disintegrating tablet Take 1 tablet (4 mg total) by mouth every 6 (six) hours as needed for nausea or vomiting. 30 tablet 3   oxybutynin (DITROPAN-XL) 10 MG 24 hr tablet Take 10 mg by mouth daily.     pravastatin (PRAVACHOL) 20 MG tablet TAKE 1 TABLET (20 MG TOTAL) BY MOUTH DAILY. TAKE 1 TABLET BY MOUTH EVERY DAY 90 tablet 1   sertraline (ZOLOFT) 100 MG tablet Take 2 tablets (200 mg total) by mouth daily. 180 tablet 1   Spacer/Aero-Holding Chambers (AEROCHAMBER PLUS) inhaler Use as instructed 1 each 0   Spacer/Aero-Holding Chambers DEVI Use as directed with inhaler 1 each 0   zonisamide (ZONEGRAN) 100 MG capsule Take 2 capsules twice a day 360 capsule 3   No current facility-administered medications on file prior to visit.   Allergies  Allergen Reactions   Lamotrigine Swelling    Tongue swelling   Bupropion Other (See Comments)    not known  Carbamazepine Other (See Comments)    Hypnatremia   Levetiracetam     Other reaction(s): Dizziness (intolerance)   Tramadol Other (See Comments)     not known   Adhesive [Tape]     ?  Blister after knee surgery   Amitriptyline     nausea   Clarithromycin     Unsure of reaction     Doxycycline     Interfered with seizure medication   Nickel     Had to remove knee implant with nickel and replace it   Other     Metal and perfumes   Topamax [Topiramate] Nausea And Vomiting   Estradiol Rash   Latex Rash   Phenytoin Sodium Extended Other (See Comments)    drowsiness   Family History  Problem Relation Age of Onset   Aortic dissection Father    Arthritis Mother    Stroke Mother 73   Heart murmur Other    Throat cancer Sister    Colon cancer Neg Hx    Esophageal cancer Neg Hx    Stomach cancer Neg Hx    Rectal cancer Neg Hx    Colon polyps Neg Hx    PE: BP 128/72 (BP Location: Right Arm, Patient Position: Sitting, Cuff Size: Normal)   Pulse 75   Ht 5\' 5"  (1.651 m)   Wt 233 lb (105.7 kg)   SpO2 95%   BMI 38.77 kg/m  Wt Readings from Last 3 Encounters:  10/10/20 233 lb (105.7 kg)  10/08/20 232 lb 12.8 oz (105.6 kg)  09/19/20 232 lb 12.8 oz (105.6 kg)   Constitutional: overweight, in NAD Eyes: PERRLA, EOMI, no exophthalmos ENT: moist mucous membranes, no thyromegaly, no cervical lymphadenopathy Cardiovascular: RRR, No MRG Respiratory: B wheezes upper lungs and L lower lung, no crackles Gastrointestinal: abdomen soft, NT, ND, BS+ Musculoskeletal: no deformities, strength intact in all 4 Skin: moist, warm, no rashes Neurological: +++ tremor with outstretched hands, DTR normal in all 4  ASSESSMENT: 1.  Post ablative hypothyroidism - After RAI treatment for Graves' disease in the 80s.  PLAN:  1. Patient with longstanding, previously uncontrolled post ablative hypothyroidism, on levothyroxine therapy.  She was on high-dose of levothyroxine in the past, with persistently suppressed TSH levels.  She was also not taking the levothyroxine correctly and we discussed how to do so.  Afterwards, we were able to decrease the  dose of her levothyroxine to 88 mcg daily and her TFTs normalized.  She returns after a longer absence, 1 year and 3 months. - latest thyroid labs reviewed with pt. >> normal: Lab Results  Component Value Date   TSH 2.14 03/19/2020  - she continues on LT4 88 mcg daily - pt feels good on this dose. - we discussed about taking the thyroid hormone every day, with water, >30 minutes before breakfast, separated by >4 hours from acid reflux medications, calcium, iron, multivitamins. Pt. is taking it correctly.  In the past, she was taking LT4 at bedtime but we moved into the morning. - will check thyroid tests today: TSH and fT4 - If labs are abnormal, she will need to return for repeat TFTs in 1.5 months - I will see her back in a year  Needs refills.  Component     Latest Ref Rng & Units 10/10/2020  TSH     0.35 - 4.50 uIU/mL 1.63  T4,Free(Direct)     0.60 - 1.60 ng/dL 0.77  Normal thyroid tests.  Philemon Kingdom, MD PhD [facility]  Endocrinology

## 2020-10-10 NOTE — Patient Instructions (Addendum)
Please continue Levothyroxine 88 mcg daily in am.  Take the thyroid hormone every day, with water, at least 30 minutes before breakfast, separated by at least 4 hours from: - acid reflux medications - calcium - iron - multivitamins  Try to take Nexium >4h after levothyroxine.  Please make sure you are off Biotin x 1 week before thyroid labs.  Please come back for a follow-up appointment in 1 year.

## 2020-10-11 MED ORDER — LEVOTHYROXINE SODIUM 88 MCG PO TABS: 88.0000 ug | ORAL_TABLET | Freq: Every day | ORAL | 3 refills | Status: DC

## 2020-10-16 ENCOUNTER — Other Ambulatory Visit: Payer: Self-pay

## 2020-10-16 ENCOUNTER — Encounter: Payer: Self-pay | Admitting: Family Medicine

## 2020-10-16 ENCOUNTER — Ambulatory Visit (INDEPENDENT_AMBULATORY_CARE_PROVIDER_SITE_OTHER): Payer: Medicare HMO | Admitting: Family Medicine

## 2020-10-16 VITALS — BP 120/60 | HR 90 | Temp 97.8°F | Resp 16 | Ht 65.0 in | Wt 233.0 lb

## 2020-10-16 DIAGNOSIS — J189 Pneumonia, unspecified organism: Secondary | ICD-10-CM | POA: Diagnosis not present

## 2020-10-16 DIAGNOSIS — J301 Allergic rhinitis due to pollen: Secondary | ICD-10-CM | POA: Diagnosis not present

## 2020-10-16 DIAGNOSIS — K219 Gastro-esophageal reflux disease without esophagitis: Secondary | ICD-10-CM | POA: Diagnosis not present

## 2020-10-16 DIAGNOSIS — M25512 Pain in left shoulder: Secondary | ICD-10-CM | POA: Diagnosis not present

## 2020-10-16 DIAGNOSIS — J3089 Other allergic rhinitis: Secondary | ICD-10-CM | POA: Diagnosis not present

## 2020-10-16 DIAGNOSIS — R0989 Other specified symptoms and signs involving the circulatory and respiratory systems: Secondary | ICD-10-CM | POA: Diagnosis not present

## 2020-10-16 DIAGNOSIS — G8929 Other chronic pain: Secondary | ICD-10-CM

## 2020-10-16 DIAGNOSIS — J3081 Allergic rhinitis due to animal (cat) (dog) hair and dander: Secondary | ICD-10-CM | POA: Diagnosis not present

## 2020-10-16 MED ORDER — CELECOXIB 100 MG PO CAPS: 100.0000 mg | ORAL_CAPSULE | Freq: Two times a day (BID) | ORAL | 0 refills | Status: AC | PRN

## 2020-10-16 NOTE — Progress Notes (Signed)
ACUTE VISIT Chief Complaint  Patient presents with   Shoulder Pain    Ongoing for a while, had a bad fall last year   HPI: Beverly Ballard is a 75 y.o. female with hx of seizure disorder,depression,anxiety,OSA, and hypothyroidism here today complaining of years hx of left shoulder pain as described above. Problem has been worse for the past few months. Had a fall last year , 02/2020,which seemed to aggravate problem. It "really hurts", "bad."  Pain is constant, radiated to left-sided neck and mid arm. Exacerbated by movement and alleviated by rest and heating pad. Occasionally she has some tingling. Negative for fever,sore throat,skin rash,joint edema or erythema. + Limitation of ROM.  She has taken Ibuprofen but it does not help. PT helped in the past.  She has seen Dr Erlinda Hong for knee pain. States that she cannot see ortho until next week and she needs something stronger for pain.  Noted right apex fine rales. She reports that she is coughing a little more than usual. No SOB or wheezing. Sometimes dysphagia for solids and liquids for the past few weeks. She is on Nexium 20 mg daily.  COPD on Albuterol inh 2 puff qid prn.  Review of Systems  Constitutional:  Positive for activity change and fatigue. Negative for appetite change.  HENT:  Negative for mouth sores and nosebleeds.   Cardiovascular:  Negative for chest pain and palpitations.  Gastrointestinal:  Negative for abdominal pain, nausea and vomiting.       Negative for changes in bowel habits.  Neurological:  Negative for syncope, weakness and headaches.  Psychiatric/Behavioral:  Negative for confusion. The patient is nervous/anxious.   Rest see pertinent positives and negatives per HPI.  Current Outpatient Medications on File Prior to Visit  Medication Sig Dispense Refill   albuterol (VENTOLIN HFA) 108 (90 Base) MCG/ACT inhaler Inhale 1-2 puffs into the lungs every 6 (six) hours as needed for wheezing or shortness  of breath. 8 g 0   b complex vitamins tablet Take 1 tablet by mouth daily.     beta carotene w/minerals (OCUVITE) tablet Take 1 tablet by mouth daily.     Cholecalciferol (VITAMIN D3 PO) Take by mouth.     cyclobenzaprine (FLEXERIL) 10 MG tablet Take 1 tablet (10 mg total) by mouth 2 (two) times daily as needed for muscle spasms. 30 tablet 0   EPINEPHrine 0.3 mg/0.3 mL IJ SOAJ injection SMARTSIG:Injection As Directed     esomeprazole (NEXIUM) 20 MG capsule Take 2 capsules (40 mg total) by mouth daily before breakfast. (Patient taking differently: Take 20 mg by mouth daily before breakfast.) 2 capsule 0   furosemide (LASIX) 20 MG tablet TAKE 1 TABLET BY MOUTH IN THE MORNING 30 tablet 1   hyoscyamine (LEVSIN SL) 0.125 MG SL tablet Place 1-2 tablets (0.125-0.25 mg total) under the tongue every 6 (six) hours as needed. 30 tablet 1   levocetirizine (XYZAL) 5 MG tablet Take 5 mg by mouth daily.      levothyroxine (SYNTHROID) 88 MCG tablet Take 1 tablet (88 mcg total) by mouth daily. 90 tablet 3   LORazepam (ATIVAN) 0.5 MG tablet Take 1 tablet (0.5 mg total) by mouth 4 (four) times daily as needed for anxiety. 60 tablet 0   ondansetron (ZOFRAN ODT) 4 MG disintegrating tablet Take 1 tablet (4 mg total) by mouth every 6 (six) hours as needed for nausea or vomiting. 30 tablet 3   oxybutynin (DITROPAN-XL) 10 MG 24 hr tablet Take  10 mg by mouth daily.     sertraline (ZOLOFT) 100 MG tablet Take 2 tablets (200 mg total) by mouth daily. 180 tablet 1   Spacer/Aero-Holding Chambers (AEROCHAMBER PLUS) inhaler Use as instructed 1 each 0   Spacer/Aero-Holding Chambers DEVI Use as directed with inhaler 1 each 0   zonisamide (ZONEGRAN) 100 MG capsule Take 2 capsules twice a day 360 capsule 3   No current facility-administered medications on file prior to visit.   Past Medical History:  Diagnosis Date   Allergy    Anxiety and depression 12/19/2009   Arthritis    Cancer ([place])    vaginal   Constipation     Degenerative disorder of eye    Diverticulosis    Essential hypertension, benign 06/26/2009   GERD 05/27/2007   takes Nexium daily   Heart murmur    yrs ago   History of gout    History of migraine many yrs ago   Hyperlipidemia    takes Pravastatin daily   Hypothyroidism    Insomnia    but doesn't take any meds   LOW BACK PAIN 05/27/2007   Numbness    left toes    Osteoporosis    Seizures ([place])    last 1983, none since then- last seizure 1988 per pt    Allergies  Allergen Reactions   Lamotrigine Swelling    Tongue swelling   Bupropion Other (See Comments)    not known   Carbamazepine Other (See Comments)    Hypnatremia   Levetiracetam     Other reaction(s): Dizziness (intolerance)   Tramadol Other (See Comments)    not known   Adhesive [Tape]     ?  Blister after knee surgery   Amitriptyline     nausea   Clarithromycin     Unsure of reaction     Doxycycline     Interfered with seizure medication   Nickel     Had to remove knee implant with nickel and replace it   Other     Metal and perfumes   Topamax [Topiramate] Nausea And Vomiting   Estradiol Rash   Latex Rash   Phenytoin Sodium Extended Other (See Comments)    drowsiness    Social History   Socioeconomic History   Marital status: Married    Spouse name: Not on file   Number of children: 1   Years of education: Not on file   Highest education level: Not on file  Occupational History   Occupation: Retired  Tobacco Use   Smoking status: Former    Packs/day: 0.25    Years: 20.00    Pack years: 5.00    Types: Cigarettes    Quit date: 09/08/1978    Years since quitting: 42.1   Smokeless tobacco: Never   Tobacco comments:    quit smoking 76yrs ago  Vaping Use   Vaping Use: Never used  Substance and Sexual Activity   Alcohol use: No   Drug use: No   Sexual activity: Never    Birth control/protection: Post-menopausal  Other Topics Concern   Not on file  Social History Narrative   Right handed     Lives with husband    Social Determinants of Health   Financial Resource Strain: Low Risk    Difficulty of Paying Living Expenses: Not hard at all  Food Insecurity: No Food Insecurity   Worried About Estate manager/land agent of Food in the Last Year: Never true   Ran Out of  Food in the Last Year: Never true  Transportation Needs: No Transportation Needs   Lack of Transportation (Medical): No   Lack of Transportation (Non-Medical): No  Physical Activity: Inactive   Days of Exercise per Week: 0 days   Minutes of Exercise per Session: 0 min  Stress: Stress Concern Present   Feeling of Stress : To some extent  Social Connections: Socially Isolated   Frequency of Communication with Friends and Family: Twice a week   Frequency of Social Gatherings with Friends and Family: Never   Attends Religious Services: Never   Marine scientist or Organizations: No   Attends Archivist Meetings: Never   Marital Status: Married   Vitals:   10/16/20 1057  BP: 120/60  Pulse: 90  Resp: 16  Temp: 97.8 F (36.6 C)  SpO2: 92%   Body mass index is 38.77 kg/m.  Physical Exam Vitals and nursing note reviewed.  Constitutional:      General: She is not in acute distress.    Appearance: She is well-developed. She is not ill-appearing.  HENT:     Head: Normocephalic and atraumatic.  Eyes:     Conjunctiva/sclera: Conjunctivae normal.  Cardiovascular:     Rate and Rhythm: Normal rate and regular rhythm.     Heart sounds: No murmur heard. Pulmonary:     Effort: Pulmonary effort is normal. No respiratory distress.     Breath sounds: Rales (right apex fine rales.) present.  Musculoskeletal:     Left shoulder: Tenderness (With movement.) present. No swelling or deformity. Decreased range of motion.     Comments: Left shoulder: No deformity, edema, or erythema appreciated.No muscle atrophy. Luan Pulling' test positive, empty can supraspinatus test negative, lift-Off Subscapularis test  negative. Passive and active ROM limited.  Skin:    General: Skin is warm.     Findings: No erythema or rash.  Neurological:     Mental Status: She is alert and oriented to person, place, and time.  Psychiatric:        Mood and Affect: Mood is anxious.   ASSESSMENT AND PLAN:  Beverly Ballard was seen today for shoulder pain.  Diagnoses and all orders for this visit: Orders Placed This Encounter  Procedures   DG Chest 2 View    Chronic left shoulder pain ROM exercises. Celebrex 100 mg bid recommended, some side effects discussed. Stop Ibuprofen. Can also take Tylenol 500 mg 3-4 times daily. Follow with ortho.  -     celecoxib (CELEBREX) 100 MG capsule; Take 1 capsule (100 mg total) by mouth 2 (two) times daily as needed for up to 10 days.  Rales Right apex. Possible etiologies discussed. Former smoker. Albuterol inh 2 puff every 6 hours for a week then as needed for wheezing or shortness of breath.   CXR ordered, further recommendations according to results. Instructed about warning signs.  Gastroesophageal reflux disease, unspecified whether esophagitis present Problem does not seem to be well controlled. Recommend increasing dose of Nexium from 20 mg to 40 mg daily. Continue GERD precautions.  Return in about 3 weeks (around 11/06/2020) for GERD with PCP.Marland Kitchen   Auriana Scalia G. [place], MD  [facility]. [place] office.

## 2020-10-16 NOTE — Patient Instructions (Addendum)
A few things to remember from today's visit:   Rales - Plan: DG Chest 2 View  Gastroesophageal reflux disease, unspecified whether esophagitis present  Chronic left shoulder pain - Plan: celecoxib (CELEBREX) 100 MG capsule  If you need refills please call your pharmacy. Do not use My Chart to request refills or for acute issues that need immediate attention.   Celebrex for 7-10 days for shoulder pain. Be sure to arrange appt with Dr Erlinda Hong. Chest X ray ordered because abnormal noises right upper chest. Albuterol inh 2 puff every 6 hours for a week then as needed for wheezing or shortness of breath.  Increase dose of Nexium from 20 mg daily to 40 mg daily. Follow with your pcp in 2-3 weeks.  Please be sure medication list is accurate. If a new problem present, please set up appointment sooner than planned today.

## 2020-10-18 ENCOUNTER — Other Ambulatory Visit: Payer: Self-pay

## 2020-10-18 ENCOUNTER — Ambulatory Visit (INDEPENDENT_AMBULATORY_CARE_PROVIDER_SITE_OTHER)
Admission: RE | Admit: 2020-10-18 | Discharge: 2020-10-18 | Disposition: A | Discharge status: Home / Self Care | Payer: Medicare HMO | Source: Ambulatory Visit | Attending: Family Medicine | Admitting: Family Medicine

## 2020-10-18 ENCOUNTER — Other Ambulatory Visit: Payer: Self-pay | Admitting: Family Medicine

## 2020-10-18 DIAGNOSIS — R0989 Other specified symptoms and signs involving the circulatory and respiratory systems: Secondary | ICD-10-CM

## 2020-10-18 DIAGNOSIS — R059 Cough, unspecified: Secondary | ICD-10-CM | POA: Diagnosis not present

## 2020-10-19 ENCOUNTER — Other Ambulatory Visit: Payer: Self-pay | Admitting: Family Medicine

## 2020-10-19 MED ORDER — AMOXICILLIN-POT CLAVULANATE 875-125 MG PO TABS: 1.0000 | ORAL_TABLET | Freq: Two times a day (BID) | ORAL | 0 refills | Status: AC

## 2020-10-28 DIAGNOSIS — G4733 Obstructive sleep apnea (adult) (pediatric): Secondary | ICD-10-CM | POA: Diagnosis not present

## 2020-10-30 ENCOUNTER — Other Ambulatory Visit: Payer: Self-pay

## 2020-10-30 ENCOUNTER — Ambulatory Visit: Payer: Medicare HMO | Admitting: Orthopaedic Surgery

## 2020-10-30 ENCOUNTER — Ambulatory Visit (INDEPENDENT_AMBULATORY_CARE_PROVIDER_SITE_OTHER): Payer: Medicare HMO

## 2020-10-30 DIAGNOSIS — M79602 Pain in left arm: Secondary | ICD-10-CM | POA: Diagnosis not present

## 2020-10-30 DIAGNOSIS — G8929 Other chronic pain: Secondary | ICD-10-CM

## 2020-10-30 DIAGNOSIS — M25512 Pain in left shoulder: Secondary | ICD-10-CM

## 2020-10-30 MED ORDER — PREDNISONE 5 MG (21) PO TBPK
ORAL_TABLET | ORAL | 0 refills | Status: DC
Start: 2020-10-30 — End: 2020-11-07

## 2020-10-30 NOTE — Progress Notes (Signed)
Office Visit Note   Patient: Beverly Ballard           Date of Birth: [birth year]           MRN: [id number] Visit Date: 10/30/2020              Requested by: Caren Macadam, MD [place],  [place] [id number] PCP: Caren Macadam, MD   Assessment & Plan: Visit Diagnoses:  1. Chronic left shoulder pain   2. Left arm pain     Plan: Impression is left upper extremity cervical spine radiculopathy.  At this point, I am fairly certain that her symptoms are coming from her neck and not her shoulder.  We have discussed a short course of steroids in addition to obtaining an MRI of the cervical spine as she has not had good relief from physical therapy in the past.  She would like to proceed with this plan.  She will follow-up with [place] after the MRIs been completed.  Call with concerns or questions in the meantime.  Follow-Up Instructions: Return for after MRI.   Orders:  Orders Placed This Encounter  Procedures   XR Shoulder Left   XR Cervical Spine 2 or 3 views   Meds ordered this encounter  Medications   predniSONE (STERAPRED UNI-PAK 21 TAB) 5 MG (21) TBPK tablet    Sig: Take as directed    Dispense:  21 tablet    Refill:  0       Procedures: No procedures performed   Clinical Data: No additional findings.   Subjective: Chief Complaint  Patient presents with   Neck - Pain   Left Shoulder - Pain    HPI patient is a pleasant 75 year old female who comes in today with left lateral neck pain which radiates to the deltoid with associated tingling down her arm and into all 5 fingers.  She has been dealing with this since November 2021 after sustaining a mechanical fall.  She describes this as constant pain without any specific aggravators.  She has been using a heating pad and taking Celebrex without significant relief of symptoms.  She has been in physical therapy for her neck which does not seem to help.  Review of Systems as detailed in HPI.  All  others reviewed and are negative.   Objective: Vital Signs: There were no vitals taken for this visit.  Physical Exam well-developed well-nourished female in no acute distress.  Alert and oriented x3.  Ortho Exam examination of the cervical spine reveals no spinous tenderness.  She does have moderate left-sided paraspinous tenderness into the parascapular region.  Increased pain with cervical spine flexion and extension.  Left shoulder reveals full active, painless range of motion in all planes.  No focal weakness.  She is neurovascular intact distally.  Specialty Comments:  No specialty comments available.  Imaging: XR Cervical Spine 2 or 3 views  Result Date: 10/30/2020 Moderate multilevel degenerative changes  XR Shoulder Left  Result Date: 10/30/2020 No acute or structural abnormalities    PMFS History: Patient Active Problem List   Diagnosis Date Noted   Generalized anxiety disorder 08/11/2020   Insomnia 08/11/2020   Major depressive disorder, recurrent episode, moderate ([place]) 08/11/2020   Obstructive sleep apnea 08/11/2020   Ankle edema, bilateral 10/31/2019   Excessive daytime sleepiness 06/28/2019   Shortness of breath 06/28/2019   Healthcare maintenance 06/28/2019   ANA positive 06/28/2019   Bronchiectasis ([place]) 06/28/2019   Depression, major,  single episode, moderate ([place]) 11/19/2018   Fatigue 03/23/2018   Left knee pain 01/12/2017   Ovarian cyst 10/30/2016   Aortic atherosclerosis ([place]) 11/19/2015   Hyperlipemia 04/03/2015   S/P revision of total knee 09/19/2014   Seizure disorder ([place]) 01/24/2014   Obesity, BMI unknown 01/24/2014   Osteopenia, stable on dexa 2010 - followed by gyn 06/15/2012   Pap smear abnormality of cervix/human papillomavirus (HPV) positive - 09/2011, followed by gyn 06/15/2012   Essential hypertension 07/03/2009   Hypothyroidism 05/27/2007   Allergic rhinitis 05/27/2007   GERD 05/27/2007   Past Medical History:  Diagnosis Date    Allergy    Anxiety and depression 12/19/2009   Arthritis    Cancer ([place])    vaginal   Constipation    Degenerative disorder of eye    Diverticulosis    Essential hypertension, benign 06/26/2009   GERD 05/27/2007   takes Nexium daily   Heart murmur    yrs ago   History of gout    History of migraine many yrs ago   Hyperlipidemia    takes Pravastatin daily   Hypothyroidism    Insomnia    but doesn't take any meds   LOW BACK PAIN 05/27/2007   Numbness    left toes    Osteoporosis    Seizures ([place])    last 1983, none since then- last seizure 1988 per pt     Family History  Problem Relation Age of Onset   Aortic dissection Father    Arthritis Mother    Stroke Mother 73   Heart murmur Other    Throat cancer Sister    Colon cancer Neg Hx    Esophageal cancer Neg Hx    Stomach cancer Neg Hx    Rectal cancer Neg Hx    Colon polyps Neg Hx     Past Surgical History:  Procedure Laterality Date   ADENOIDECTOMY     BACK SURGERY     lumbar x2   BRONCHIAL BIOPSY  09/12/2019   Procedure: BRONCHIAL BIOPSIES;  Surgeon: Laurin Coder, MD;  Location: [facility];  Service: Endoscopy;;   BRONCHIAL WASHINGS  09/12/2019   Procedure: BRONCHIAL WASHINGS;  Surgeon: Laurin Coder, MD;  Location: [facility];  Service: Endoscopy;;   COLONOSCOPY  2009   ESOPHAGOGASTRODUODENOSCOPY     Heel spurs Bilateral    HEMOSTASIS CONTROL  09/12/2019   Procedure: HEMOSTASIS CONTROL;  Surgeon: Laurin Coder, MD;  Location: [facility];  Service: Endoscopy;;  epi   KNEE ARTHROPLASTY Left 11/07/2013   Procedure: COMPUTER ASSISTED TOTAL KNEE ARTHROPLASTY;  Surgeon: Marybelle Killings, MD;  Location: [facility];  Service: Orthopedics;  Laterality: Left;  Left Total Knee Arthroplasty, Cemented, Computer Assist   TONSILLECTOMY     TOTAL KNEE REVISION Left 09/19/2014   Procedure: LEFT TOTAL KNEE REVISION;  Surgeon: Leandrew Koyanagi, MD;  Location: [facility];  Service: Orthopedics;  Laterality: Left;   UPPER  GASTROINTESTINAL ENDOSCOPY     VIDEO BRONCHOSCOPY N/A 09/12/2019   Procedure: VIDEO BRONCHOSCOPY WITH FLUORO;  Surgeon: Laurin Coder, MD;  Location: [facility];  Service: Endoscopy;  Laterality: N/A;   Social History   Occupational History   Occupation: Retired  Tobacco Use   Smoking status: Former    Packs/day: 0.25    Years: 20.00    Pack years: 5.00    Types: Cigarettes    Quit date: 09/08/1978    Years since quitting: 42.1   Smokeless tobacco: Never  Tobacco comments:    quit smoking 60yrs ago  Vaping Use   Vaping Use: Never used  Substance and Sexual Activity   Alcohol use: No   Drug use: No   Sexual activity: Never    Birth control/protection: Post-menopausal

## 2020-11-07 ENCOUNTER — Encounter: Payer: Self-pay | Admitting: Neurology

## 2020-11-07 ENCOUNTER — Ambulatory Visit: Payer: Medicare HMO | Admitting: Neurology

## 2020-11-07 ENCOUNTER — Other Ambulatory Visit: Payer: Self-pay

## 2020-11-07 VITALS — BP 131/89 | HR 78 | Ht 65.0 in | Wt 230.4 lb

## 2020-11-07 DIAGNOSIS — G40309 Generalized idiopathic epilepsy and epileptic syndromes, not intractable, without status epilepticus: Secondary | ICD-10-CM

## 2020-11-07 DIAGNOSIS — J301 Allergic rhinitis due to pollen: Secondary | ICD-10-CM | POA: Diagnosis not present

## 2020-11-07 DIAGNOSIS — J3081 Allergic rhinitis due to animal (cat) (dog) hair and dander: Secondary | ICD-10-CM | POA: Diagnosis not present

## 2020-11-07 DIAGNOSIS — J3089 Other allergic rhinitis: Secondary | ICD-10-CM | POA: Diagnosis not present

## 2020-11-07 DIAGNOSIS — R42 Dizziness and giddiness: Secondary | ICD-10-CM

## 2020-11-07 MED ORDER — ZONISAMIDE 100 MG PO CAPS: ORAL_CAPSULE | ORAL | 3 refills | Status: DC

## 2020-11-07 NOTE — Patient Instructions (Signed)
I hope you feel better soon.  Continue Zonisamide 200mg  twice a day  2. Let me know if you would like to proceed with vestibular therapy to help with vertigo  3. Proceed with MRI cervical spine as scheduled  4. Follow-up in 6-8 months, call for any changes  Seizure Precautions: 1. If medication has been prescribed for you to prevent seizures, take it exactly as directed.  Do not stop taking the medicine without talking to your doctor first, even if you have not had a seizure in a long time.   2. Avoid activities in which a seizure would cause danger to yourself or to others.  Don't operate dangerous machinery, swim alone, or climb in high or dangerous places, such as on ladders, roofs, or girders.  Do not drive unless your doctor says you may.  3. If you have any warning that you may have a seizure, lay down in a safe place where you can't hurt yourself.    4.  No driving for 6 months from last seizure, as per [facility].   Please refer to the following link on the [place] website for more information: http://[email or web address].org/answerplace/Social/driving/drivingu.cfm   5.  Maintain good sleep hygiene.  6.  Contact your doctor if you have any problems that may be related to the medicine you are taking.  7.  Call 911 and bring the patient back to the ED if:        A.  The seizure lasts longer than 5 minutes.       B.  The patient doesn't awaken shortly after the seizure  C.  The patient has new problems such as difficulty seeing, speaking or moving  D.  The patient was injured during the seizure  E.  The patient has a temperature over 102 F (39C)  F.  The patient vomited and now is having trouble breathing

## 2020-11-07 NOTE — Progress Notes (Signed)
NEUROLOGY FOLLOW UP OFFICE NOTE  Beverly Ballard [id number] Nov 06, 1945  HISTORY OF PRESENT ILLNESS: I had the pleasure of seeing Beverly Ballard in follow-up in the neurology clinic on 11/07/2020.  The patient was last seen 3 months ago after she had a fall in November 2021 followed by feeling foggy, sleep and balance changes, headaches, dizziness, suggestive of post-concussion syndrome. Records and images were personally reviewed where available.  I personally reviewed MRI brain without contrast done 07/2020 which did not show any acute changes, there was note of asymmetric left periatrial white matter changes that were new since 2017 but not acute. She was started on amitriptyline for postconcussive headache but had worsening nausea and stopped it. Since her last visit, she reports overall not feeling well, "just one thing after another." She had a fall in April and hurt her knees, no loss of consciousness. She did not have her cane that time. She is getting over a recent bout of pneumonia. She has been having significant neck pain and finished a course of prednisone, which did help with pain. No focal weakness, bowel/bladder dysfunction. She had trouble with balance and dizziness, with tingling going down her left arm, affecting sleep. She is scheduled for a cervical MRI next week. Dizziness is described as a spinning sensation, usually when she is laying down. She is not sure if it is her nerves, but she feels "on the edge." She has prn lorazepam but has not taken it. She had not been sleeping well, waking up at 3am. She had been unable to use her CPAP regularly, taking it off after 1.5 hours, however last night she was able to keep it on and had her first good sleep in 2 months. She lives with her husband and denies being told of any staring/unresponsive episodes, she denies any gaps in time, myoclonic jerks. She has been seizure-free since 2014 on Zonisamide 200mg  BID.   History on Initial Assessment  10/28/2019: This is a 75 year old right-handed woman with a history of hypertension, hyperlipidemia, hypothyroidism, migraines, vaginal cancer, complex partial seizures, presenting for second opinion regarding seizure medication. Records from her neurologist Dr. Macario Carls were reviewed. Seizures started in childhood then stopped for a few years until she was in her teens. She thinks she had staring episodes, no further staring spells since 1988. She had a seizure in 2014 when she was completely weaned off seizure medication. She recalls she would smell a funny odor prior to a seizure. No nocturnal seizures. She has tried several seizure medications, she could not tolerate Levetiracetam, Lamotrigine. She had hyponatremia on carbamazepine. Aptiom was cost-prohibitive. She has been taking Zonisamide 200mg  BID since 2017. MRI brain with and without contrast in 2017 was unremarkable, there was a small Rathke's cleft cyst within the pituitary gland measuring 79mm.  She presents for second opinion regarding her seizure medication. She has been having a lot of health problems and has had a "test on every part of my body" with no clear cause found. She has been suffering from nausea for the past 5 years, no vomiting, occasional stomach pain. She is having a lot of drowsiness. She feels sick to her stomach right now and has an acidic taste. She lives with her husband and denies any staring/unresponsive episodes, gaps in time, olfactory/gustatory hallucinations, myoclonic jerks. She has occasional left arm tingling. She has right-sided neck pain and back pain. No headaches, dizziness, vision changes, bowel/bladder dysfunction. Shd a fall 2-3 weeks ago when she fell backward.  She is not sure if she passed out, mostly reporting she has not been feeling good for a long time. She had a pneumonia 3 years ago where she had inflammation in her lungs and was not feeling well, she had another bout this year, as well as dealing with  depression. She has a CPAP machine which has helped her sleep a little better at night.  Epilepsy Risk Factors:  She had a normal birth and early development.  There is no history of febrile convulsions, CNS infections such as meningitis/encephalitis, significant traumatic brain injury, neurosurgical procedures, or family history of seizures.  Prior AEDs: Levetiracetam, Lamotrigine, carbamazepine, lacosamide  Diagnostic Data: MRI brain with and without contrast in 2017 was unremarkable, there was a small Rathke's cleft cyst within the pituitary gland measuring 87mm. Her 1-hour EEG in July 2021 was abnormal with frequent bursts of generalized high voltage 3-5 Hz spike and polyspike and wave discharges.  PAST MEDICAL HISTORY: Past Medical History:  Diagnosis Date   Allergy    Anxiety and depression 12/19/2009   Arthritis    Cancer ([place])    vaginal   Constipation    Degenerative disorder of eye    Diverticulosis    Essential hypertension, benign 06/26/2009   GERD 05/27/2007   takes Nexium daily   Heart murmur    yrs ago   History of gout    History of migraine many yrs ago   Hyperlipidemia    takes Pravastatin daily   Hypothyroidism    Insomnia    but doesn't take any meds   LOW BACK PAIN 05/27/2007   Numbness    left toes    Osteoporosis    Seizures ([place])    last 1983, none since then- last seizure 1988 per pt     MEDICATIONS: Current Outpatient Medications on File Prior to Visit  Medication Sig Dispense Refill   albuterol (VENTOLIN HFA) 108 (90 Base) MCG/ACT inhaler Inhale 1-2 puffs into the lungs every 6 (six) hours as needed for wheezing or shortness of breath. 8 g 0   b complex vitamins tablet Take 1 tablet by mouth daily.     beta carotene w/minerals (OCUVITE) tablet Take 1 tablet by mouth daily.     Cholecalciferol (VITAMIN D3 PO) Take by mouth.     cyclobenzaprine (FLEXERIL) 10 MG tablet Take 1 tablet (10 mg total) by mouth 2 (two) times daily as needed for muscle spasms.  30 tablet 0   EPINEPHrine 0.3 mg/0.3 mL IJ SOAJ injection SMARTSIG:Injection As Directed     esomeprazole (NEXIUM) 20 MG capsule Take 2 capsules (40 mg total) by mouth daily before breakfast. (Patient taking differently: Take 20 mg by mouth daily before breakfast.) 2 capsule 0   furosemide (LASIX) 20 MG tablet TAKE 1 TABLET BY MOUTH IN THE MORNING 30 tablet 1   hyoscyamine (LEVSIN SL) 0.125 MG SL tablet Place 1-2 tablets (0.125-0.25 mg total) under the tongue every 6 (six) hours as needed. 30 tablet 1   levocetirizine (XYZAL) 5 MG tablet Take 5 mg by mouth daily.      levothyroxine (SYNTHROID) 88 MCG tablet Take 1 tablet (88 mcg total) by mouth daily. 90 tablet 3   lisinopril (ZESTRIL) 5 MG tablet TAKE 1 TABLET BY MOUTH EVERY DAY 90 tablet 1   LORazepam (ATIVAN) 0.5 MG tablet Take 1 tablet (0.5 mg total) by mouth 4 (four) times daily as needed for anxiety. 60 tablet 0   ondansetron (ZOFRAN ODT) 4 MG disintegrating tablet  Take 1 tablet (4 mg total) by mouth every 6 (six) hours as needed for nausea or vomiting. 30 tablet 3   oxybutynin (DITROPAN-XL) 10 MG 24 hr tablet Take 10 mg by mouth daily.     pravastatin (PRAVACHOL) 20 MG tablet TAKE 1 TABLET (20 MG TOTAL) BY MOUTH DAILY. 90 tablet 1   predniSONE (STERAPRED UNI-PAK 21 TAB) 5 MG (21) TBPK tablet Take as directed 21 tablet 0   sertraline (ZOLOFT) 100 MG tablet Take 2 tablets (200 mg total) by mouth daily. 180 tablet 1   Spacer/Aero-Holding Chambers (AEROCHAMBER PLUS) inhaler Use as instructed 1 each 0   Spacer/Aero-Holding Chambers DEVI Use as directed with inhaler 1 each 0   zonisamide (ZONEGRAN) 100 MG capsule Take 2 capsules twice a day 360 capsule 3   No current facility-administered medications on file prior to visit.    ALLERGIES: Allergies  Allergen Reactions   Lamotrigine Swelling    Tongue swelling   Bupropion Other (See Comments)    not known   Carbamazepine Other (See Comments)    Hypnatremia   Levetiracetam     Other  reaction(s): Dizziness (intolerance)   Tramadol Other (See Comments)    not known   Adhesive [Tape]     ?  Blister after knee surgery   Amitriptyline     nausea   Clarithromycin     Unsure of reaction     Doxycycline     Interfered with seizure medication   Nickel     Had to remove knee implant with nickel and replace it   Other     Metal and perfumes   Topamax [Topiramate] Nausea And Vomiting   Estradiol Rash   Latex Rash   Phenytoin Sodium Extended Other (See Comments)    drowsiness    FAMILY HISTORY: Family History  Problem Relation Age of Onset   Aortic dissection Father    Arthritis Mother    Stroke Mother 5   Heart murmur Other    Throat cancer Sister    Colon cancer Neg Hx    Esophageal cancer Neg Hx    Stomach cancer Neg Hx    Rectal cancer Neg Hx    Colon polyps Neg Hx     SOCIAL HISTORY: Social History   Socioeconomic History   Marital status: Married    Spouse name: Not on file   Number of children: 1   Years of education: Not on file   Highest education level: Not on file  Occupational History   Occupation: Retired  Tobacco Use   Smoking status: Former    Packs/day: 0.25    Years: 20.00    Pack years: 5.00    Types: Cigarettes    Quit date: 09/08/1978    Years since quitting: 42.1   Smokeless tobacco: Never   Tobacco comments:    quit smoking 59yrs ago  Vaping Use   Vaping Use: Never used  Substance and Sexual Activity   Alcohol use: No   Drug use: No   Sexual activity: Never    Birth control/protection: Post-menopausal  Other Topics Concern   Not on file  Social History Narrative   Right handed    Lives with husband    Social Determinants of Health   Financial Resource Strain: Low Risk    Difficulty of Paying Living Expenses: Not hard at all  Food Insecurity: No Food Insecurity   Worried About Running Out of Food in the Last Year: Never true  Ran Out of Food in the Last Year: Never true  Transportation Needs: No  Transportation Needs   Lack of Transportation (Medical): No   Lack of Transportation (Non-Medical): No  Physical Activity: Inactive   Days of Exercise per Week: 0 days   Minutes of Exercise per Session: 0 min  Stress: Stress Concern Present   Feeling of Stress : To some extent  Social Connections: Socially Isolated   Frequency of Communication with Friends and Family: Twice a week   Frequency of Social Gatherings with Friends and Family: Never   Attends Religious Services: Never   Printmaker: No   Attends Music therapist: Never   Marital Status: Married  Human resources officer Violence: Not At Risk   Fear of Current or Ex-Partner: No   Emotionally Abused: No   Physically Abused: No   Sexually Abused: No     PHYSICAL EXAM: Vitals:   11/07/20 1353  BP: 131/89  Pulse: 78  SpO2: 95%   General: No acute distress Head:  Normocephalic/atraumatic Skin/Extremities: No rash, no edema Neurological Exam: alert and awake. No aphasia or dysarthria. Fund of knowledge is appropriate.  Recent and remote memory are intact.  Attention and concentration are normal.   Cranial nerves: Pupils equal, round. Extraocular movements intact with no nystagmus. Visual fields full.  No facial asymmetry.  Motor: Bulk and tone normal, no cogwheeling, muscle strength 5/5 throughout with no pronator drift.   Finger to nose testing intact.  Gait slow and cautious with cane reporting left knee stiffness, no ataxia. She has bilateral postural > endpoint tremor. No resting tremor. narrow-based and steady, able to tandem walk adequately.  Good finger taps.   IMPRESSION: This is a 75 yo RH woman with a history of hypertension, hyperlipidemia, hypothyroidism, migraines, vaginal cancer, primary generalized epilepsy, seizure-free since 2014. She has overall been feeling unwell since recent bout of pneumonia, another fall in April affecting her knees, and neck pain. Proceed with MRI  cervical spine as scheduled. She is reporting symptoms suggestive of positional vertigo, we discussed vestibular rehab referral, she would like to hold off for now but will call if symptoms worsen. Continue Zonisamide 200mg  BID, refills sent. She is aware of [place] driving laws to stop driving after a seizure until 6 months seizure-free. Follow-up in 6-8 months, call for any changes.   Thank you for allowing me to participate in her care.  Please do not hesitate to call for any questions or concerns.   Ellouise Newer, M.D.   CC: Dr. Ethlyn Gallery

## 2020-11-09 DIAGNOSIS — J3089 Other allergic rhinitis: Secondary | ICD-10-CM | POA: Diagnosis not present

## 2020-11-09 DIAGNOSIS — J3081 Allergic rhinitis due to animal (cat) (dog) hair and dander: Secondary | ICD-10-CM | POA: Diagnosis not present

## 2020-11-09 DIAGNOSIS — J301 Allergic rhinitis due to pollen: Secondary | ICD-10-CM | POA: Diagnosis not present

## 2020-11-14 DIAGNOSIS — J301 Allergic rhinitis due to pollen: Secondary | ICD-10-CM | POA: Diagnosis not present

## 2020-11-14 DIAGNOSIS — J3089 Other allergic rhinitis: Secondary | ICD-10-CM | POA: Diagnosis not present

## 2020-11-14 DIAGNOSIS — J3081 Allergic rhinitis due to animal (cat) (dog) hair and dander: Secondary | ICD-10-CM | POA: Diagnosis not present

## 2020-11-15 ENCOUNTER — Other Ambulatory Visit: Payer: Self-pay

## 2020-11-15 ENCOUNTER — Ambulatory Visit
Admission: RE | Admit: 2020-11-15 | Discharge: 2020-11-15 | Disposition: A | Discharge status: Home / Self Care | Payer: Medicare HMO | Source: Ambulatory Visit | Attending: Orthopaedic Surgery | Admitting: Orthopaedic Surgery

## 2020-11-15 DIAGNOSIS — M79602 Pain in left arm: Secondary | ICD-10-CM

## 2020-11-15 DIAGNOSIS — M4802 Spinal stenosis, cervical region: Secondary | ICD-10-CM | POA: Diagnosis not present

## 2020-11-16 ENCOUNTER — Encounter: Payer: Self-pay | Admitting: Family Medicine

## 2020-11-16 ENCOUNTER — Ambulatory Visit (INDEPENDENT_AMBULATORY_CARE_PROVIDER_SITE_OTHER): Payer: Medicare HMO | Admitting: Family Medicine

## 2020-11-16 VITALS — BP 102/62 | HR 70 | Temp 98.7°F | Ht 65.0 in | Wt 231.0 lb

## 2020-11-16 DIAGNOSIS — R0602 Shortness of breath: Secondary | ICD-10-CM | POA: Diagnosis not present

## 2020-11-16 DIAGNOSIS — R062 Wheezing: Secondary | ICD-10-CM

## 2020-11-16 MED ORDER — AEROCHAMBER PLUS MISC: 0 refills | Status: DC

## 2020-11-16 NOTE — Progress Notes (Signed)
Beverly Ballard DOB: August 05, 1945 Encounter date: 11/16/2020  This is a 75 y.o. female who presents with Chief Complaint  Patient presents with   Follow-up    History of present illness: Last visit with me was 09/19/20. She is here for a follow up from visit with Dr. [place] and with pneumonia dx on xray 10/19/20. She is feeling much better overall from breathing standpoint.   Saw neurology 7/21; has eval pending for MRI cervical spine; consideration for vestibular rehab suggested.   Stable visit with endo recently.  Saw Dr. Erlinda Hong as well - pain up arm, shoulder and right up into neck. Pain has been bad; keeping her up at night. States that prednisone helped but she had to stop the celebrex due to her other medication. Her pain is a lot better now. Pain level has been better - at worst in last couple days 5/10.   Has been very anxious; taking more anxiety pills. Just tired of everything. She is getting up at doing more. She has been doing more - canning, making tomato sauce, pepper hash, doing deep cleaning and feels this has helped her. Takes ativan just if feeling very stressed. In last week has had a few days where she took twice/day.  She does tend to take a couple of naps during the day.   Has been doing better from falling standpoint. Doing more and feels stronger.   Hasn't used inhaler today. Is coughing some - gets choked on mucous.  Allergies  Allergen Reactions   Lamotrigine Swelling    Tongue swelling   Bupropion Other (See Comments)    not known   Carbamazepine Other (See Comments)    Hypnatremia   Levetiracetam     Other reaction(s): Dizziness (intolerance)   Tramadol Other (See Comments)    not known   Adhesive [Tape]     ?  Blister after knee surgery   Amitriptyline     nausea   Clarithromycin     Unsure of reaction     Doxycycline     Interfered with seizure medication   Nickel     Had to remove knee implant with nickel and replace it   Other     Metal and  perfumes   Topamax [Topiramate] Nausea And Vomiting   Estradiol Rash   Latex Rash   Phenytoin Sodium Extended Other (See Comments)    drowsiness   Current Meds  Medication Sig   albuterol (VENTOLIN HFA) 108 (90 Base) MCG/ACT inhaler Inhale 1-2 puffs into the lungs every 6 (six) hours as needed for wheezing or shortness of breath.   b complex vitamins tablet Take 1 tablet by mouth daily.   beta carotene w/minerals (OCUVITE) tablet Take 1 tablet by mouth daily.   Cholecalciferol (VITAMIN D3 PO) Take by mouth.   cyclobenzaprine (FLEXERIL) 10 MG tablet Take 1 tablet (10 mg total) by mouth 2 (two) times daily as needed for muscle spasms.   EPINEPHrine 0.3 mg/0.3 mL IJ SOAJ injection SMARTSIG:Injection As Directed   esomeprazole (NEXIUM) 20 MG capsule Take 2 capsules (40 mg total) by mouth daily before breakfast. (Patient taking differently: Take 20 mg by mouth daily before breakfast.)   furosemide (LASIX) 20 MG tablet TAKE 1 TABLET BY MOUTH IN THE MORNING   hyoscyamine (LEVSIN SL) 0.125 MG SL tablet Place 1-2 tablets (0.125-0.25 mg total) under the tongue every 6 (six) hours as needed.   levocetirizine (XYZAL) 5 MG tablet Take 5 mg by mouth daily.  levothyroxine (SYNTHROID) 88 MCG tablet Take 1 tablet (88 mcg total) by mouth daily.   lisinopril (ZESTRIL) 5 MG tablet TAKE 1 TABLET BY MOUTH EVERY DAY   LORazepam (ATIVAN) 0.5 MG tablet Take 1 tablet (0.5 mg total) by mouth 4 (four) times daily as needed for anxiety.   ondansetron (ZOFRAN ODT) 4 MG disintegrating tablet Take 1 tablet (4 mg total) by mouth every 6 (six) hours as needed for nausea or vomiting.   pravastatin (PRAVACHOL) 20 MG tablet TAKE 1 TABLET (20 MG TOTAL) BY MOUTH DAILY.   sertraline (ZOLOFT) 100 MG tablet Take 2 tablets (200 mg total) by mouth daily.   Spacer/Aero-Holding Chambers (AEROCHAMBER PLUS) inhaler Use as instructed   Spacer/Aero-Holding Dorise Bullion Use as directed with inhaler   zonisamide (ZONEGRAN) 100 MG capsule  Take 2 capsules twice a day    Review of Systems  Constitutional:  Positive for fatigue. Negative for chills and fever.  Respiratory:  Positive for cough and shortness of breath (esp during coughing episodes). Negative for chest tightness and wheezing.   Cardiovascular:  Negative for chest pain, palpitations and leg swelling.   Objective:  BP 102/62 (BP Location: Left Arm, Patient Position: Sitting, Cuff Size: Large)   Pulse 70   Temp 98.7 F (37.1 C) (Oral)   Ht '5\' 5"'$  (1.651 m)   Wt 231 lb (104.8 kg)   SpO2 94%   BMI 38.44 kg/m   Weight: 231 lb (104.8 kg)   BP Readings from Last 3 Encounters:  11/16/20 102/62  11/07/20 131/89  10/16/20 120/60   Wt Readings from Last 3 Encounters:  11/16/20 231 lb (104.8 kg)  11/07/20 230 lb 6.4 oz (104.5 kg)  10/16/20 233 lb (105.7 kg)    Physical Exam Constitutional:      General: She is not in acute distress.    Appearance: She is well-developed. She is obese.  Cardiovascular:     Rate and Rhythm: Normal rate and regular rhythm.     Heart sounds: Normal heart sounds. No murmur heard.   No friction rub.  Pulmonary:     Effort: Pulmonary effort is normal. No respiratory distress.     Breath sounds: Decreased breath sounds and wheezing (diffuse end exp) present. No rales.  Musculoskeletal:     Right lower leg: No edema.     Left lower leg: No edema.  Skin:    Coloration: Skin is pale.  Neurological:     Mental Status: She is alert and oriented to person, place, and time.  Psychiatric:        Behavior: Behavior normal.    Assessment/Plan  1. Shortness of breath Discussed using spacing chamber to get more benefit from albuterol inhaler; sample breztri given today for 2 weeks use. Let me know if still having cough/wheezing once this is complete or any worsening in the meanwhile.  We will plan to repeat chest x-ray in the next 1 to 2 weeks. - Spacer/Aero-Holding Chambers (AEROCHAMBER PLUS) inhaler; Use as instructed  Dispense: 1  each; Refill: 0  2. Wheezing Overall she is feeling better and states that breathing is better.  She did feel that prednisone which was taking her shoulder helped with her breathing.  She will let me know how she does with a sample inhaler and how she is feeling in a couple weeks.   Return in about 3 months (around 02/16/2021) for Chronic condition visit.  44 minutes spent in chart review, time with patient, charting, exam, discussion of follow  up plan, proper use of inhaler instruction.     Micheline Rough, MD

## 2020-11-16 NOTE — Patient Instructions (Addendum)
Can try the breztri aerosphere 1 puff twice daily. Rinse out mouth with water after use. You can use the spacing chamber with both your albuterol inhaler and this inhaler. Let me know if you need a "refill" or we need to reconsider treatment once you use this up.   Go to [facility] for chest x ray.

## 2020-11-21 ENCOUNTER — Ambulatory Visit (INDEPENDENT_AMBULATORY_CARE_PROVIDER_SITE_OTHER)
Admission: RE | Admit: 2020-11-21 | Discharge: 2020-11-21 | Disposition: A | Discharge status: Home / Self Care | Payer: Medicare HMO | Source: Ambulatory Visit | Attending: Family Medicine | Admitting: Family Medicine

## 2020-11-21 ENCOUNTER — Other Ambulatory Visit: Payer: Self-pay

## 2020-11-21 DIAGNOSIS — J3089 Other allergic rhinitis: Secondary | ICD-10-CM | POA: Diagnosis not present

## 2020-11-21 DIAGNOSIS — J189 Pneumonia, unspecified organism: Secondary | ICD-10-CM | POA: Diagnosis not present

## 2020-11-21 DIAGNOSIS — I517 Cardiomegaly: Secondary | ICD-10-CM | POA: Diagnosis not present

## 2020-11-21 DIAGNOSIS — J301 Allergic rhinitis due to pollen: Secondary | ICD-10-CM | POA: Diagnosis not present

## 2020-11-21 DIAGNOSIS — J3081 Allergic rhinitis due to animal (cat) (dog) hair and dander: Secondary | ICD-10-CM | POA: Diagnosis not present

## 2020-11-25 ENCOUNTER — Other Ambulatory Visit: Payer: Self-pay | Admitting: Family Medicine

## 2020-11-25 DIAGNOSIS — J849 Interstitial pulmonary disease, unspecified: Secondary | ICD-10-CM

## 2020-11-25 DIAGNOSIS — R0602 Shortness of breath: Secondary | ICD-10-CM

## 2020-11-27 ENCOUNTER — Other Ambulatory Visit: Payer: Self-pay

## 2020-11-27 ENCOUNTER — Ambulatory Visit (INDEPENDENT_AMBULATORY_CARE_PROVIDER_SITE_OTHER): Payer: Medicare HMO | Admitting: Orthopaedic Surgery

## 2020-11-27 ENCOUNTER — Encounter: Payer: Self-pay | Admitting: Orthopaedic Surgery

## 2020-11-27 VITALS — Ht 65.0 in | Wt 231.0 lb

## 2020-11-27 DIAGNOSIS — M5412 Radiculopathy, cervical region: Secondary | ICD-10-CM

## 2020-11-27 DIAGNOSIS — G4733 Obstructive sleep apnea (adult) (pediatric): Secondary | ICD-10-CM | POA: Diagnosis not present

## 2020-11-27 DIAGNOSIS — J301 Allergic rhinitis due to pollen: Secondary | ICD-10-CM | POA: Diagnosis not present

## 2020-11-27 DIAGNOSIS — J3081 Allergic rhinitis due to animal (cat) (dog) hair and dander: Secondary | ICD-10-CM | POA: Diagnosis not present

## 2020-11-27 DIAGNOSIS — J3089 Other allergic rhinitis: Secondary | ICD-10-CM | POA: Diagnosis not present

## 2020-11-27 NOTE — Progress Notes (Signed)
Office Visit Note   Patient: Beverly Ballard           Date of Birth: 1946/01/17           MRN: [id number] Visit Date: 11/27/2020              Requested by: Caren Macadam, MD [place],  [place] [address] PCP: Caren Macadam, MD   Assessment & Plan: Visit Diagnoses:  1. Radiculopathy of cervical spine     Plan: Impression is left-sided neck pain and left upper extremity radiculopathy with recent MRI findings suggestive of severe left foraminal narrowing C5-6.  At this point, the patient has tried steroids, physical therapy and muscle relaxers without relief of symptoms.  We will make referral to Dr. Ernestina Patches for epidural steroid injection.  She agrees and would like to proceed.  Follow-up with [place] as needed.  Follow-Up Instructions: Return if symptoms worsen or fail to improve.   Orders:  No orders of the defined types were placed in this encounter.  No orders of the defined types were placed in this encounter.     Procedures: No procedures performed   Clinical Data: No additional findings.   Subjective: Chief Complaint  Patient presents with   Neck - Follow-up    MRI review    HPI patient is a pleasant 75 year old female who has been dealing with left upper extremity radiculopathy for a while.  She has been to physical therapy and tried steroid tapers without significant relief.  Subsequent MRI of the cervical spine was ordered which showed severe left foraminal narrowing C5-6.  Recently undergone epidural steroid injection.     Objective: Vital Signs: Ht '5\' 5"'$  (1.651 m)   Wt 231 lb (104.8 kg)   BMI 38.44 kg/m     Ortho Exam unchanged cervical spine exam  Specialty Comments:  No specialty comments available.  Imaging: No new imaging   PMFS History: Patient Active Problem List   Diagnosis Date Noted   Generalized anxiety disorder 08/11/2020   Insomnia 08/11/2020   Major depressive disorder, recurrent episode, moderate  ([place]) 08/11/2020   Obstructive sleep apnea 08/11/2020   Ankle edema, bilateral 10/31/2019   Excessive daytime sleepiness 06/28/2019   Shortness of breath 06/28/2019   Healthcare maintenance 06/28/2019   ANA positive 06/28/2019   Bronchiectasis ([place]) 06/28/2019   Depression, major, single episode, moderate ([place]) 11/19/2018   Fatigue 03/23/2018   Left knee pain 01/12/2017   Ovarian cyst 10/30/2016   Aortic atherosclerosis ([place]) 11/19/2015   Hyperlipemia 04/03/2015   S/P revision of total knee 09/19/2014   Seizure disorder ([place]) 01/24/2014   Obesity, BMI unknown 01/24/2014   Osteopenia, stable on dexa 2010 - followed by gyn 06/15/2012   Pap smear abnormality of cervix/human papillomavirus (HPV) positive - 09/2011, followed by gyn 06/15/2012   Essential hypertension 07/03/2009   Hypothyroidism 05/27/2007   Allergic rhinitis 05/27/2007   GERD 05/27/2007   Past Medical History:  Diagnosis Date   Allergy    Anxiety and depression 12/19/2009   Arthritis    Cancer ([place])    vaginal   Constipation    Degenerative disorder of eye    Diverticulosis    Essential hypertension, benign 06/26/2009   GERD 05/27/2007   takes Nexium daily   Heart murmur    yrs ago   History of gout    History of migraine many yrs ago   Hyperlipidemia    takes Pravastatin daily   Hypothyroidism  Insomnia    but doesn't take any meds   LOW BACK PAIN 05/27/2007   Numbness    left toes    Osteoporosis    Seizures ([place])    last 1983, none since then- last seizure 1988 per pt     Family History  Problem Relation Age of Onset   Aortic dissection Father    Arthritis Mother    Stroke Mother 39   Heart murmur Other    Throat cancer Sister    Colon cancer Neg Hx    Esophageal cancer Neg Hx    Stomach cancer Neg Hx    Rectal cancer Neg Hx    Colon polyps Neg Hx     Past Surgical History:  Procedure Laterality Date   ADENOIDECTOMY     BACK SURGERY     lumbar x2   BRONCHIAL BIOPSY  09/12/2019    Procedure: BRONCHIAL BIOPSIES;  Surgeon: Laurin Coder, MD;  Location: [facility];  Service: Endoscopy;;   BRONCHIAL WASHINGS  09/12/2019   Procedure: BRONCHIAL WASHINGS;  Surgeon: Laurin Coder, MD;  [facility];  Service: Endoscopy;;   COLONOSCOPY  2009   ESOPHAGOGASTRODUODENOSCOPY     Heel spurs Bilateral    HEMOSTASIS CONTROL  09/12/2019   Procedure: HEMOSTASIS CONTROL;  Surgeon: Laurin Coder, MD;  [facility];  Service: Endoscopy;;  epi   KNEE ARTHROPLASTY Left 11/07/2013   Procedure: COMPUTER ASSISTED TOTAL KNEE ARTHROPLASTY;  Surgeon: Marybelle Killings, MD;  Location: [facility];  Service: Orthopedics;  Laterality: Left;  Left Total Knee Arthroplasty, Cemented, Computer Assist   TONSILLECTOMY     TOTAL KNEE REVISION Left 09/19/2014   Procedure: LEFT TOTAL KNEE REVISION;  Surgeon: Leandrew Koyanagi, MD;  Location: [facility];  Service: Orthopedics;  Laterality: Left;   UPPER GASTROINTESTINAL ENDOSCOPY     VIDEO BRONCHOSCOPY N/A 09/12/2019   Procedure: VIDEO BRONCHOSCOPY WITH FLUORO;  Surgeon: Laurin Coder, MD;  [facility];  Service: Endoscopy;  Laterality: N/A;   Social History   Occupational History   Occupation: Retired  Tobacco Use   Smoking status: Former    Packs/day: 0.25    Years: 20.00    Pack years: 5.00    Types: Cigarettes    Quit date: 09/08/1978    Years since quitting: 42.2   Smokeless tobacco: Never   Tobacco comments:    quit smoking 52yr ago  Vaping Use   Vaping Use: Never used  Substance and Sexual Activity   Alcohol use: No   Drug use: No   Sexual activity: Never    Birth control/protection: Post-menopausal

## 2020-11-29 ENCOUNTER — Telehealth: Payer: Self-pay | Admitting: Family Medicine

## 2020-11-29 NOTE — Telephone Encounter (Signed)
Pt call and stated she want dr.Koberlein to know that the Beverly Ballard is working and want a RX [facility]  [id number] [place], [place]:  [phone] Fax:  [phone]

## 2020-11-29 NOTE — Telephone Encounter (Signed)
Spoke with the patient for more information as this is not on the current medication list.  Patient stated Dr Ethlyn Gallery gave her a sample of this medication, she would like to have more samples and requests refills be sent to [facility].  Message sent to PCP.

## 2020-11-30 ENCOUNTER — Other Ambulatory Visit: Payer: Self-pay | Admitting: Family Medicine

## 2020-11-30 ENCOUNTER — Ambulatory Visit: Payer: Medicare HMO | Admitting: Gastroenterology

## 2020-11-30 ENCOUNTER — Encounter: Payer: Self-pay | Admitting: Gastroenterology

## 2020-11-30 VITALS — BP 116/58 | HR 80 | Ht 64.25 in | Wt 229.4 lb

## 2020-11-30 DIAGNOSIS — R933 Abnormal findings on diagnostic imaging of other parts of digestive tract: Secondary | ICD-10-CM

## 2020-11-30 DIAGNOSIS — R1030 Lower abdominal pain, unspecified: Secondary | ICD-10-CM

## 2020-11-30 DIAGNOSIS — R69 Illness, unspecified: Secondary | ICD-10-CM | POA: Diagnosis not present

## 2020-11-30 DIAGNOSIS — R110 Nausea: Secondary | ICD-10-CM

## 2020-11-30 DIAGNOSIS — F411 Generalized anxiety disorder: Secondary | ICD-10-CM | POA: Diagnosis not present

## 2020-11-30 MED ORDER — IBGARD 90 MG PO CPCR: ORAL_CAPSULE | ORAL | 0 refills | Status: DC

## 2020-11-30 MED ORDER — SUTAB 1479-225-188 MG PO TABS: 1.0000 | ORAL_TABLET | Freq: Once | ORAL | 0 refills | Status: AC

## 2020-11-30 MED ORDER — BREZTRI AEROSPHERE 160-9-4.8 MCG/ACT IN AERO: 2.0000 | INHALATION_SPRAY | Freq: Two times a day (BID) | RESPIRATORY_TRACT | 2 refills | Status: DC

## 2020-11-30 NOTE — Telephone Encounter (Signed)
Ok to put 2 samples up front for her to pick up on Monday (30 day supply); we can reassess need at that time. Pharmacy can help with cost if we decide to move forward with medication.

## 2020-11-30 NOTE — Telephone Encounter (Signed)
Are you able to check coverage on this for me or advise on cost savings options? Just didn't want to blindly send this in.

## 2020-11-30 NOTE — Patient Instructions (Addendum)
If you are age 73 or older, your body mass index should be between 23-30. Your Body mass index is 39.07 kg/m. If this is out of the aforementioned range listed, please consider follow up with your Primary Care Provider.  If you are age 28 or younger, your body mass index should be between 19-25. Your Body mass index is 39.07 kg/m. If this is out of the aformentioned range listed, please consider follow up with your Primary Care Provider.   __________________________________________________________  The [facility] providers would like to encourage you to use [facility] to communicate with providers for non-urgent requests or questions.  Due to long hold times on the telephone, sending your provider a message by [facility] may be a faster and more efficient way to get a response.  Please allow 48 business hours for a response.  Please remember that this is for non-urgent requests.   You have been scheduled for a colonoscopy. Please follow written instructions given to you at your visit today.  Please pick up your prep supplies at the pharmacy within the next 1-3 days. If you use inhalers (even only as needed), please bring them with you on the day of your procedure.  We have given you samples of the following medication to take: IBGard - Take as directed  Thank you for entrusting me with your care and for choosing [facility], Dr. [place] Cellar

## 2020-11-30 NOTE — Telephone Encounter (Signed)
I did send it in for her. Our pharmacist said it should be covered (but will be $47). I am not certain she will need this long term; we were using more to help with getting over pneumonia. So if too pricey for her we could give 2 samples (30 day supply) and then reassess need in a month. If she is feeling much better with it though; then we could have her stay on longer (a few months) and reasess. Just let me know if cost is issue for her so we can work with this.

## 2020-11-30 NOTE — Progress Notes (Signed)
HPI :  75 year old female here for follow-up visit for abdominal pain and chronic nausea.  I have seen her in the past for chronic nausea and abdominal pain.  Recall that she developed some lower abdominal pain with changes in bowel habits in January.  She had a CT scan which showed focal colitis in her descending colon.  She was treated empirically with antibiotics.  She had negative GI pathogen panel at that time.  Unfortunately she states she continues to have intermittent lower abdominal discomfort which can be present there most of the time.  She tried some Levsin but it did not help.  She takes iron supplementation and her stools are baseline slightly darker but no blood that is obvious to her.  She has history of constipation in the past, no diarrhea.  Sometimes having a bowel movement can make her feel slightly better but she generally has not felt well over the past several months.  Her appetite remains poor.  She feels nauseated mostly all the time, nothing has really helped this in the past including Zofran and Phenergan.  She does have some severe anxiety at times and she thinks can play a role in how her stomach is feeling, she thinks perhaps related to her nausea.  When she takes Ativan this can help her nausea.  She denies any weight loss at all.  She is being evaluated for possible interstitial lung disease and is awaiting a CT scan of her chest.  She has not required any supplemental oxygen.  She has a history of recurrent pneumonia in the past.  Recall that she has had an extensive evaluation for her nausea to include endoscopy, gastric emptying study, CT scan imaging, labs including testing for adrenal insufficiency.  MRI brain was performed as well.  None of these have showed a clear cause.  She changed her neurologist for management of her seizure disorder, stopped her zonisamide for period of time but she states that did not help her nausea at all and so she resumed it.  She otherwise  is not taking much of her pain medication and vitals appear stable.  No fevers.   Prior endoscopic evaluation:  Colonoscopy 02/02/2017 -  The examined portion of the ileum was normal. - Diverticulosis in the sigmoid colon. - Anal papilla(e) were hypertrophied. - The examination was otherwise normal. - No polyps   CT abdomen / pelvis 10/14/2016 - no acute pathology. Small R ovarian cyst. Pelvic US obtained which showed stable ovarian cyst, recommend surveillance in one year per radiology.    Colonoscopy 07/09/2007 - normal   EGD was done 05/07/15 for chronic nausea and for dysphagia. I did not see any evidence of pathology noted to cause dysphagia. Otherwise she didn't have any clear pathology to cause nausea on her exam. She had some benign fundic gland polyps and biopsies of the stomach showed no evidence for H pylori.    GES 11/29/2015 - normal    MRI brain 12/07/2015 - normal, incidental 7 mm Rathke's cleft cyst.     EGD 05/05/19  - A 1 cm hiatal hernia was present. - The exam of the esophagus was otherwise normal. No obvious stenosis / stricture. - A guidewire was placed and the scope was withdrawn. Dilation was performed in the entire esophagus with a Savary dilator with mild resistance at 17 mm and 18 mm. [place] endoscopy showed a ? small wrent in the upper esophagus below the UES. Biopsies were taken with a cold forceps  in the upper third of the esophagus, in the middle third of the esophagus and in the lower third of the esophagus for histology. - The entire examined stomach was normal. Biopsies were taken with a cold forceps for Helicobacter pylori testing. - The duodenal bulb and second portion of the duodenum were normal.   1. Surgical [P], random gastric sites - MILD REACTIVE GASTROPATHY. [place] IS NEGATIVE FOR HELICOBACTER PYLORI. - NO INTESTINAL METAPLASIA, DYSPLASIA, OR MALIGNANCY. 2. Surgical [P], random esophageal sites - BENIGN SQUAMOUS MUCOSA. - NO  INCREASE IN INTRAEPITHELIAL EOSINOPHILS. - NO INTESTINAL METAPLASIA, DYSPLASIA, OR MALIGNANCY.     CT scan 05/11/20 - IMPRESSION: Changes consistent with focal colitis in the descending colon. This is likely inflammatory in nature. No perforation or abscess is seen.   Stable right ovarian simple cyst. No follow-up imaging recommended. Note: This recommendation does not apply to premenarchal patients and to those with increased risk (genetic, family history, elevated tumor markers or other high-risk factors) of ovarian cancer. Reference: JACR 2020 Feb; 17(2):248-254   Scarring in the bases bilaterally with emphysematous changes. Emphysema (ICD10-J43.9).     Past Medical History:  Diagnosis Date   Allergy    Anxiety and depression 12/19/2009   Arthritis    Cancer ([place])    vaginal   Constipation    Degenerative disorder of eye    Diverticulosis    Essential hypertension, benign 06/26/2009   GERD 05/27/2007   takes Nexium daily   Heart murmur    yrs ago   History of gout    History of migraine many yrs ago   Hyperlipidemia    takes Pravastatin daily   Hypothyroidism    Insomnia    but doesn't take any meds   LOW BACK PAIN 05/27/2007   Numbness    left toes    Osteoporosis    Pneumonia    Seizures ([place])    last 1983, none since then- last seizure 1988 per pt      Past Surgical History:  Procedure Laterality Date   ADENOIDECTOMY     BACK SURGERY     lumbar x2   BRONCHIAL BIOPSY  09/12/2019   Procedure: BRONCHIAL BIOPSIES;  Surgeon: Laurin Coder, MD;  [facility];  Service: Endoscopy;;   BRONCHIAL WASHINGS  09/12/2019   Procedure: BRONCHIAL WASHINGS;  Surgeon: Laurin Coder, MD;  [facility];  Service: Endoscopy;;   COLONOSCOPY  2009   ESOPHAGOGASTRODUODENOSCOPY     Heel spurs Bilateral    HEMOSTASIS CONTROL  09/12/2019   Procedure: HEMOSTASIS CONTROL;  Surgeon: Laurin Coder, MD;  [facility];  Service: Endoscopy;;   epi   KNEE ARTHROPLASTY Left 11/07/2013   Procedure: COMPUTER ASSISTED TOTAL KNEE ARTHROPLASTY;  Surgeon: Marybelle Killings, MD;  Location: [facility];  Service: Orthopedics;  Laterality: Left;  Left Total Knee Arthroplasty, Cemented, Computer Assist   TONSILLECTOMY     TOTAL KNEE REVISION Left 09/19/2014   Procedure: LEFT TOTAL KNEE REVISION;  Surgeon: Leandrew Koyanagi, MD;  Location: [facility];  Service: Orthopedics;  Laterality: Left;   UPPER GASTROINTESTINAL ENDOSCOPY     VIDEO BRONCHOSCOPY N/A 09/12/2019   Procedure: VIDEO BRONCHOSCOPY WITH FLUORO;  Surgeon: Laurin Coder, MD;  [facility];  Service: Endoscopy;  Laterality: N/A;   Family History  Problem Relation Age of Onset   Aortic dissection Father    Arthritis Mother    Stroke Mother 86   Heart murmur Other  Throat cancer Sister    Colon cancer Neg Hx    Esophageal cancer Neg Hx    Stomach cancer Neg Hx    Rectal cancer Neg Hx    Colon polyps Neg Hx    Social History   Tobacco Use   Smoking status: Former    Packs/day: 0.25    Years: 20.00    Pack years: 5.00    Types: Cigarettes    Quit date: 09/08/1978    Years since quitting: 42.2   Smokeless tobacco: Never   Tobacco comments:    quit smoking 90yr ago  Vaping Use   Vaping Use: Never used  Substance Use Topics   Alcohol use: No   Drug use: No   Current Outpatient Medications  Medication Sig Dispense Refill   albuterol (VENTOLIN HFA) 108 (90 Base) MCG/ACT inhaler Inhale 1-2 puffs into the lungs every 6 (six) hours as needed for wheezing or shortness of breath. 8 g 0   b complex vitamins tablet Take 1 tablet by mouth daily.     beta carotene w/minerals (OCUVITE) tablet Take 1 tablet by mouth daily.     Budeson-Glycopyrrol-Formoterol (BREZTRI AEROSPHERE) 160-9-4.8 MCG/ACT AERO Inhale 2 puffs into the lungs 2 (two) times daily. 10.7 g 2   Cholecalciferol (VITAMIN D3 PO) Take by mouth.     cyclobenzaprine (FLEXERIL) 10 MG tablet Take 1 tablet (10 mg total) by  mouth 2 (two) times daily as needed for muscle spasms. 30 tablet 0   EPINEPHrine 0.3 mg/0.3 mL IJ SOAJ injection SMARTSIG:Injection As Directed     esomeprazole (NEXIUM) 20 MG capsule Take 2 capsules (40 mg total) by mouth daily before breakfast. (Patient taking differently: Take 20 mg by mouth daily before breakfast.) 2 capsule 0   furosemide (LASIX) 20 MG tablet TAKE 1 TABLET BY MOUTH IN THE MORNING 30 tablet 1   hyoscyamine (LEVSIN SL) 0.125 MG SL tablet Place 1-2 tablets (0.125-0.25 mg total) under the tongue every 6 (six) hours as needed. 30 tablet 1   levocetirizine (XYZAL) 5 MG tablet Take 5 mg by mouth daily.      levothyroxine (SYNTHROID) 88 MCG tablet Take 1 tablet (88 mcg total) by mouth daily. 90 tablet 3   lisinopril (ZESTRIL) 5 MG tablet TAKE 1 TABLET BY MOUTH EVERY DAY 90 tablet 1   LORazepam (ATIVAN) 0.5 MG tablet Take 1 tablet (0.5 mg total) by mouth 4 (four) times daily as needed for anxiety. 60 tablet 0   ondansetron (ZOFRAN ODT) 4 MG disintegrating tablet Take 1 tablet (4 mg total) by mouth every 6 (six) hours as needed for nausea or vomiting. 30 tablet 3   pravastatin (PRAVACHOL) 20 MG tablet TAKE 1 TABLET (20 MG TOTAL) BY MOUTH DAILY. 90 tablet 1   sertraline (ZOLOFT) 100 MG tablet Take 2 tablets (200 mg total) by mouth daily. 180 tablet 1   Spacer/Aero-Holding Chambers (AEROCHAMBER PLUS) inhaler Use as instructed 1 each 0   zonisamide (ZONEGRAN) 100 MG capsule Take 2 capsules twice a day 360 capsule 3   No current facility-administered medications for this visit.   Allergies  Allergen Reactions   Lamotrigine Swelling    Tongue swelling   Bupropion Other (See Comments)    not known   Carbamazepine Other (See Comments)    Hypnatremia   Levetiracetam     Other reaction(s): Dizziness (intolerance)   Tramadol Other (See Comments)    not known   Adhesive [Tape]     ?  Blister after  knee surgery   Amitriptyline     nausea   Clarithromycin     Unsure of reaction      Doxycycline     Interfered with seizure medication   Nickel     Had to remove knee implant with nickel and replace it   Other     Metal and perfumes   Topamax [Topiramate] Nausea And Vomiting   Estradiol Rash   Latex Rash   Phenytoin Sodium Extended Other (See Comments)    drowsiness     Review of Systems: All systems reviewed and negative except where noted in HPI.    DG Chest 2 View  Result Date: 11/22/2020 CLINICAL DATA:  Right upper lobe opacity-follow-up after antibiotics. EXAM: CHEST - 2 VIEW COMPARISON:  10/18/2020 and 05/11/2020. FINDINGS: Midline trachea. Mild cardiomegaly. Mild right hemidiaphragm elevation. Relatively diffuse interstitial thickening is peripheral predominant. The somewhat more focal area in the right upper lobe is not significantly changed. IMPRESSION: No change in appearance of the chest since 10/18/2020. Diffuse peripheral predominant interstitial thickening, somewhat more focal within the right upper lobe. Findings are suspicious for interstitial lung disease. Consider high-resolution chest CT correlation. Electronically Signed   By: Abigail Miyamoto M.D.   On: 11/22/2020 11:02   MR Cervical Spine w/o contrast  Result Date: 11/16/2020 CLINICAL DATA:  Spinal stenosis. Left arm pain M79.602 (ICD-10-CM). EXAM: MRI CERVICAL SPINE WITHOUT CONTRAST TECHNIQUE: Multiplanar, multisequence MR imaging of the cervical spine was performed. No intravenous contrast was administered. COMPARISON:  MRI of the cervical spine October 01, 2017. FINDINGS: Alignment: Straightening of the cervical curvature. Vertebrae: No fracture, evidence of discitis, or bone lesion. Cord: Normal signal and morphology. Posterior Fossa, vertebral arteries, paraspinal tissues: Negative. Disc levels: C2-3: No spinal canal or neural foraminal stenosis. C3-4: Facet degenerative changes. No spinal canal or neural foraminal stenosis. C4-5: Small posterior disc protrusion and mild facet degenerative changes  without significant spinal canal or neural foraminal stenosis. C5-6: Mild loss of disc height, a posterior disc osteophyte complex without significant spinal canal stenosis. Uncovertebral and facet degenerative changes resulting in mild right and severe left neural foraminal narrowing, unchanged from prior. C6-7: Small posterior disc osteophyte complex. No significant spinal canal or neural foraminal stenosis. C7-T1: Mild facet degenerative changes. No spinal canal or neural foraminal stenosis. IMPRESSION: 1. Mild degenerative changes of the cervical spine, more pronounced at C5-6 where there is severe left neural foraminal narrowing. No significant change from prior MRI. 2. No high-grade spinal canal stenosis at any level. Electronically Signed   By: Pedro Earls M.D.   On: 11/16/2020 10:31    Lab Results  Component Value Date   WBC 8.7 09/19/2020   HGB 13.7 09/19/2020   HCT 41.1 09/19/2020   MCV 93.1 09/19/2020   PLT 202.0 09/19/2020    Lab Results  Component Value Date   CREATININE 0.90 09/19/2020   BUN 9 09/19/2020   NA 140 09/19/2020   K 4.3 09/19/2020   CL 105 09/19/2020   CO2 23 09/19/2020   Lab Results  Component Value Date   ALT 11 09/19/2020   AST 17 09/19/2020   ALKPHOS 94 09/19/2020   BILITOT 0.4 09/19/2020     Physical Exam: BP (!) 116/58 (BP Location: Left Arm, Patient Position: Sitting, Cuff Size: Normal)   Pulse 80   Ht 5' 4.25" (1.632 m) Comment: height measured without shoes  Wt 229 lb 6 oz (104 kg)   BMI 39.07 kg/m  Constitutional: Pleasant,well-developed, female  in no acute distress. Cardiovascular: Normal rate, regular rhythm.  Pulmonary/chest: Effort normal and breath sounds normal.  Abdominal: Soft, nondistended, nontender.  There are no masses palpable.  Extremities: no edema Lymphadenopathy: No cervical adenopathy noted. Neurological: Alert and oriented to person place and time. Skin: Skin is warm and dry. No rashes  noted. Psychiatric: Normal mood and affect. Behavior is normal.   ASSESSMENT AND PLAN: 75 year old female here for reassessment the following:  Lower abdominal pain Abnormal CT scan GI tract Chronic nausea Anxiety  As above, ongoing lower abdominal discomfort for several months now.  Prior CT scan in January showed some thickening of her left colon.  Treated empirically with antibiotics, did not help.  Levsin no benefit.  CBC okay.  GI pathogen panel negative although her bowels have appeared to normalize.  Discussed options with her, given her continued lower abdominal discomfort and prior thickening of her colon on CT scan, offered her colonoscopy to make sure everything is okay in light of the CT scan findings.  I discussed colonoscopy with her including risks and benefits and she wants to proceed.  We were able to get her appointment to have this done next week.  In the interim provided her some IBgard samples to see if that will help her pain in the interim.  Otherwise she has chronic nausea for years now without a clear cause, her testing has been extensive and negative.  She has a lot of anxiety about this and I wonder if anxiety is driving her upper tract symptoms at this point.  I will speak with her behavioral health team to see if buspirone is an option which may help her anxiety and treat dyspepsia as well.  I offered her empiric Reglan as she has not tried that yet to help her nausea, she wants to hold off on that for now and make 1 change at a time to her medications.  We will await her colonoscopy I will get back to her once I hear from her behavioral health team.  She agrees  Plan: - Colonoscopy - Monday PM - trial of IB gard samples - I will reach out to her behavioral health team about management of anxiety in relation to her upper tract symptoms, see if buspirone is an option  Jolly Mango, MD [facility]

## 2020-11-30 NOTE — Telephone Encounter (Signed)
Patient informed of the message  below and stated the cost is too expensive for her at this time.  Message sent to PCP.

## 2020-12-03 ENCOUNTER — Encounter: Payer: Self-pay | Admitting: Gastroenterology

## 2020-12-03 ENCOUNTER — Ambulatory Visit (AMBULATORY_SURGERY_CENTER): Payer: Medicare HMO | Admitting: Gastroenterology

## 2020-12-03 VITALS — BP 105/61 | HR 56 | Temp 97.1°F | Resp 22 | Ht 64.25 in | Wt 229.0 lb

## 2020-12-03 DIAGNOSIS — R1030 Lower abdominal pain, unspecified: Secondary | ICD-10-CM | POA: Diagnosis not present

## 2020-12-03 DIAGNOSIS — K5730 Diverticulosis of large intestine without perforation or abscess without bleeding: Secondary | ICD-10-CM | POA: Diagnosis not present

## 2020-12-03 DIAGNOSIS — D123 Benign neoplasm of transverse colon: Secondary | ICD-10-CM

## 2020-12-03 DIAGNOSIS — R933 Abnormal findings on diagnostic imaging of other parts of digestive tract: Secondary | ICD-10-CM

## 2020-12-03 DIAGNOSIS — D122 Benign neoplasm of ascending colon: Secondary | ICD-10-CM

## 2020-12-03 MED ORDER — SODIUM CHLORIDE 0.9 % IV SOLN: 500.0000 mL | Freq: Once | INTRAVENOUS | Status: DC

## 2020-12-03 MED ORDER — BREZTRI AEROSPHERE 160-9-4.8 MCG/ACT IN AERO: 2.0000 | INHALATION_SPRAY | Freq: Two times a day (BID) | RESPIRATORY_TRACT | 0 refills | Status: DC

## 2020-12-03 NOTE — Progress Notes (Signed)
To PACU< VSS. Report to Rn.tb 

## 2020-12-03 NOTE — Progress Notes (Signed)
Interval note: Patient just seen on 11/30/20 for abdominal pain. See full note for details of her case. Prior CT scan showed left colon thickening, thought to be infectious but given persistent discomfort over time will evaluate with colonoscopy. Last colonoscopy 01/2017. Also with chronic nausea. No interval changes since I have last seen her. I have discussed risks / benefits of colonoscopy and anesthesia with her and she wishes to proceed. Further recommendations pending the results.

## 2020-12-03 NOTE — Patient Instructions (Addendum)
Handouts on polyps & diverticulosis given to you today  Await pathology results on polyps removed   Continue with trial of IB gard to see if that helps - Dr Havery Moros discussed options with pt   YOU HAD AN ENDOSCOPIC PROCEDURE TODAY AT [facility]:   Refer to the procedure report that was given to you for any specific questions about what was found during the examination.  If the procedure report does not answer your questions, please call your gastroenterologist to clarify.  If you requested that your care partner not be given the details of your procedure findings, then the procedure report has been included in a sealed envelope for you to review at your convenience later.  YOU SHOULD EXPECT: Some feelings of bloating in the abdomen. Passage of more gas than usual.  Walking can help get rid of the air that was put into your GI tract during the procedure and reduce the bloating. If you had a lower endoscopy (such as a colonoscopy or flexible sigmoidoscopy) you may notice spotting of blood in your stool or on the toilet paper. If you underwent a bowel prep for your procedure, you may not have a normal bowel movement for a few days.  Please Note:  You might notice some irritation and congestion in your nose or some drainage.  This is from the oxygen used during your procedure.  There is no need for concern and it should clear up in a day or so.  SYMPTOMS TO REPORT IMMEDIATELY:  Following lower endoscopy (colonoscopy or flexible sigmoidoscopy):  Excessive amounts of blood in the stool  Significant tenderness or worsening of abdominal pains  Swelling of the abdomen that is new, acute  Fever of 100F or higher   For urgent or emergent issues, a gastroenterologist can be reached at any hour by calling [phone]. Do not use MyChart messaging for urgent concerns.    DIET:  We do recommend a small meal at first, but then you may proceed to your regular diet.  Drink plenty  of fluids but you should avoid alcoholic beverages for 24 hours.  ACTIVITY:  You should plan to take it easy for the rest of today and you should NOT DRIVE or use heavy machinery until tomorrow (because of the sedation medicines used during the test).    FOLLOW UP: Our staff will call the number listed on your records 48-72 hours following your procedure to check on you and address any questions or concerns that you may have regarding the information given to you following your procedure. If we do not reach you, we will leave a message.  We will attempt to reach you two times.  During this call, we will ask if you have developed any symptoms of COVID 19. If you develop any symptoms (ie: fever, flu-like symptoms, shortness of breath, cough etc.) before then, please call [phone].  If you test positive for Covid 19 in the 2 weeks post procedure, please call and report this information to [place].    If any biopsies were taken you will be contacted by phone or by letter within the next 1-3 weeks.  Please call us at [phone] if you have not heard about the biopsies in 3 weeks.    SIGNATURES/CONFIDENTIALITY: You and/or your care partner have signed paperwork which will be entered into your electronic medical record.  These signatures attest to the fact that that the information above on your After Visit Summary has been  reviewed and is understood.  Full responsibility of the confidentiality of this discharge information lies with you and/or your care-partner.

## 2020-12-03 NOTE — Op Note (Signed)
[place] Patient Name: Beverly Ballard Procedure Date: 12/03/2020 4:00 PM MRN: [id number] Endoscopist: Remo Lipps P. Havery Moros , MD Age: 75 Referring MD:  Date of Birth: Jan 03, 1946 Gender: Female Account #: [id number] Procedure:                Colonoscopy Indications:              Lower abdominal pain, Abnormal CT of the GI tract                            (thickening of left colon in January), no                            improvement with antibiotics or antispasmotics Medicines:                Monitored Anesthesia Care Procedure:                Pre-Anesthesia Assessment:                           - Prior to the procedure, a History and Physical                            was performed, and patient medications and                            allergies were reviewed. The patient's tolerance of                            previous anesthesia was also reviewed. The risks                            and benefits of the procedure and the sedation                            options and risks were discussed with the patient.                            All questions were answered, and informed consent                            was obtained. Prior Anticoagulants: The patient has                            taken no previous anticoagulant or antiplatelet                            agents. ASA Grade Assessment: III - A patient with                            severe systemic disease. After reviewing the risks                            and benefits, the patient was deemed in  satisfactory condition to undergo the procedure.                           After obtaining informed consent, the colonoscope                            was passed under direct vision. Throughout the                            procedure, the patient's blood pressure, pulse, and                            oxygen saturations were monitored continuously. The                            CF [id number]  [id number] was introduced through the anus                            and advanced to the the cecum, identified by                            appendiceal orifice and ileocecal valve. The                            colonoscopy was performed without difficulty. The                            patient tolerated the procedure well. The quality                            of the bowel preparation was adequate. The terminal                            ileum, ileocecal valve, appendiceal orifice, and                            rectum were photographed. Scope In: 4:14:58 PM Scope Out: 4:38:10 PM Scope Withdrawal Time: 0 hours 16 minutes 16 seconds  Total Procedure Duration: 0 hours 23 minutes 12 seconds  Findings:                 The perianal and digital rectal examinations were                            normal.                           The terminal ileum appeared normal.                           A 3 mm polyp was found in the ascending colon. The                            polyp was sessile. The polyp was removed with a  cold snare. Resection and retrieval were complete.                           A diminutive polyp was found in the transverse                            colon. The polyp was sessile. The polyp was removed                            with a cold biopsy forceps as I could not grasp it                            with the snare. Resection and retrieval were                            complete.                           A few small-mouthed diverticula were found in the                            left colon.                           The colon was quite tortuous.                           The exam was otherwise without abnormality. Prep                            was adequate but several minutes taken to lavage                            the colon to obtain adequate views. Complications:            No immediate complications. Estimated blood loss:                             Minimal. Estimated Blood Loss:     Estimated blood loss was minimal. Impression:               - The examined portion of the ileum was normal.                           - One 3 mm polyp in the ascending colon, removed                            with a cold snare. Resected and retrieved.                           - One diminutive polyp in the transverse colon,                            removed with a cold biopsy forceps. Resected and  retrieved.                           - Diverticulosis in the left colon.                           - Tortuous colon.                           - The examination was otherwise normal.                           No cause for pain on colonoscopy, no pathology to                            correlate with prior thickening of the colon, which                            could have been due to self limited infection at                            the time. Recommendation:           - Patient has a contact number available for                            emergencies. The signs and symptoms of potential                            delayed complications were discussed with the                            patient. Return to normal activities tomorrow.                            Written discharge instructions were provided to the                            patient.                           - Resume previous diet.                           - Continue present medications.                           - Await pathology results.                           - I will discuss options with the patient. She will                            see behavioral health later this week to discuss                            adding buspirone  for anxiety and upper tract                            symptoms                           - Continue with trial of IB gard to see if that                            helps [employer]. Maximilian Tallo, MD 12/03/2020 4:47:38 PM This  report has been signed electronically.

## 2020-12-03 NOTE — Telephone Encounter (Signed)
Patient informed of the message below.  Patient is aware 2 sample boxes [phone] E00-exp 04/10/23) were left at the front desk for pick up.

## 2020-12-03 NOTE — Progress Notes (Signed)
[facility]  [facility] no seizure activity since 1988

## 2020-12-04 ENCOUNTER — Other Ambulatory Visit: Payer: Self-pay | Admitting: *Deleted

## 2020-12-04 MED ORDER — BREZTRI AEROSPHERE 160-9-4.8 MCG/ACT IN AERO: 2.0000 | INHALATION_SPRAY | Freq: Two times a day (BID) | RESPIRATORY_TRACT | 0 refills | Status: DC

## 2020-12-05 ENCOUNTER — Telehealth: Payer: Self-pay

## 2020-12-05 NOTE — Telephone Encounter (Signed)
Second follow up call attempt, no answer LM 

## 2020-12-05 NOTE — Telephone Encounter (Signed)
Left message on follow up call. 

## 2020-12-07 ENCOUNTER — Ambulatory Visit: Payer: Medicare HMO | Admitting: Physician Assistant

## 2020-12-07 ENCOUNTER — Other Ambulatory Visit: Payer: Self-pay

## 2020-12-07 ENCOUNTER — Encounter: Payer: Self-pay | Admitting: Physician Assistant

## 2020-12-07 DIAGNOSIS — G4700 Insomnia, unspecified: Secondary | ICD-10-CM

## 2020-12-07 DIAGNOSIS — J301 Allergic rhinitis due to pollen: Secondary | ICD-10-CM | POA: Diagnosis not present

## 2020-12-07 DIAGNOSIS — G4733 Obstructive sleep apnea (adult) (pediatric): Secondary | ICD-10-CM | POA: Diagnosis not present

## 2020-12-07 DIAGNOSIS — R110 Nausea: Secondary | ICD-10-CM | POA: Diagnosis not present

## 2020-12-07 DIAGNOSIS — F331 Major depressive disorder, recurrent, moderate: Secondary | ICD-10-CM | POA: Diagnosis not present

## 2020-12-07 DIAGNOSIS — J3081 Allergic rhinitis due to animal (cat) (dog) hair and dander: Secondary | ICD-10-CM | POA: Diagnosis not present

## 2020-12-07 DIAGNOSIS — F411 Generalized anxiety disorder: Secondary | ICD-10-CM | POA: Diagnosis not present

## 2020-12-07 DIAGNOSIS — R69 Illness, unspecified: Secondary | ICD-10-CM | POA: Diagnosis not present

## 2020-12-07 DIAGNOSIS — J3089 Other allergic rhinitis: Secondary | ICD-10-CM | POA: Diagnosis not present

## 2020-12-07 MED ORDER — BUSPIRONE HCL 15 MG PO TABS: ORAL_TABLET | ORAL | 1 refills | Status: DC

## 2020-12-07 MED ORDER — LORAZEPAM 1 MG PO TABS: 1.0000 mg | ORAL_TABLET | Freq: Four times a day (QID) | ORAL | 1 refills | Status: DC | PRN

## 2020-12-07 NOTE — Progress Notes (Signed)
[facility] Med Check  Patient ID: Beverly Ballard,  MRN: [id number]  PCP: Caren Macadam, MD  Date of Evaluation: 12/07/2020 Time spent:40 minutes  Chief Complaint:  Chief Complaint   Anxiety; Depression; Follow-up      HISTORY/CURRENT STATUS: HPI  For 4 month med check.   More anxious.  Rarely takes the Xanax but probably needs it more than she takes it.  Not sleeping well. Stayed up till 2:00 a.m. the other night.  Says she is just not very happy.  A lot of that is not new though, has been unhappy in her marriage for a long time.  States she does not really get excited about much of anything.  Energy and motivation are low.  She is nauseated a lot and does not feel like eating.  No reported weight loss.  She does isolate, does not want to be around a lot of people.  Denies suicidal or homicidal thoughts.  Discouraged with many health probs. Had pneumonia twice this year, stomach probs, has been seeing her gastroenterologist, Dr. Trinna Balloon, GI work-up has been negative.  She continues to follow up with neurology due to her history of seizures, and then several falls in the past year, one in November 2021 when she hit her head and had postconcussive syndrome.  Stable as far as neurology is concerned.  Patient denies increased energy with decreased need for sleep, no increased talkativeness, no racing thoughts, no impulsivity or risky behaviors, no increased spending, no increased libido, no grandiosity, no increased irritability or anger, no paranoia, and no hallucinations.  Review of Systems  Constitutional:  Positive for malaise/fatigue.  HENT: Negative.    Eyes: Negative.   Respiratory: Negative.    Cardiovascular: Negative.   Gastrointestinal:  Positive for abdominal pain and nausea.       See notes per Dr. Trinna Balloon  Genitourinary: Negative.   Musculoskeletal:  Positive for neck pain.  Skin: Negative.   Neurological: Negative.   Endo/Heme/Allergies: Negative.    Psychiatric/Behavioral:         See HPI    Individual Medical History/ Review of Systems: Changes? :Yes  See HPI  Past medications for mental health diagnoses include: Paxil, Prozac, Valium  Allergies: Lamotrigine, Bupropion, Carbamazepine, Levetiracetam, Tramadol, Adhesive [tape], Amitriptyline, Clarithromycin, Doxycycline, Nickel, Other, Topamax [topiramate], Estradiol, Latex, and Phenytoin sodium extended  Current Medications:  Current Outpatient Medications:    b complex vitamins tablet, Take 1 tablet by mouth daily., Disp: , Rfl:    beta carotene w/minerals (OCUVITE) tablet, Take 1 tablet by mouth daily., Disp: , Rfl:    Budeson-Glycopyrrol-Formoterol (BREZTRI AEROSPHERE) 160-9-4.8 MCG/ACT AERO, Inhale 2 puffs into the lungs 2 (two) times daily., Disp: 5.9 g, Rfl: 0   busPIRone (BUSPAR) 15 MG tablet, 1/3 po bid for 1 week, then 2/3 po bid for 1 week, then 1 po bid., Disp: 60 tablet, Rfl: 1   Cholecalciferol (VITAMIN D3 PO), Take by mouth., Disp: , Rfl:    cyclobenzaprine (FLEXERIL) 10 MG tablet, Take 1 tablet (10 mg total) by mouth 2 (two) times daily as needed for muscle spasms., Disp: 30 tablet, Rfl: 0   EPINEPHrine 0.3 mg/0.3 mL IJ SOAJ injection, SMARTSIG:Injection As Directed, Disp: , Rfl:    esomeprazole (NEXIUM) 20 MG capsule, Take 2 capsules (40 mg total) by mouth daily before breakfast. (Patient taking differently: Take 20 mg by mouth daily before breakfast.), Disp: 2 capsule, Rfl: 0   furosemide (LASIX) 20 MG tablet, TAKE 1 TABLET BY MOUTH IN THE  MORNING, Disp: 30 tablet, Rfl: 1   hyoscyamine (LEVSIN SL) 0.125 MG SL tablet, Place 1-2 tablets (0.125-0.25 mg total) under the tongue every 6 (six) hours as needed., Disp: 30 tablet, Rfl: 1   levocetirizine (XYZAL) 5 MG tablet, Take 5 mg by mouth daily. , Disp: , Rfl:    levothyroxine (SYNTHROID) 88 MCG tablet, Take 1 tablet (88 mcg total) by mouth daily., Disp: 90 tablet, Rfl: 3   lisinopril (ZESTRIL) 5 MG tablet, TAKE 1 TABLET  BY MOUTH EVERY DAY, Disp: 90 tablet, Rfl: 1   LORazepam (ATIVAN) 1 MG tablet, Take 1 tablet (1 mg total) by mouth every 6 (six) hours as needed for anxiety., Disp: 60 tablet, Rfl: 1   pravastatin (PRAVACHOL) 20 MG tablet, TAKE 1 TABLET (20 MG TOTAL) BY MOUTH DAILY., Disp: 90 tablet, Rfl: 1   sertraline (ZOLOFT) 100 MG tablet, Take 2 tablets (200 mg total) by mouth daily., Disp: 180 tablet, Rfl: 1   Spacer/Aero-Holding Chambers (AEROCHAMBER PLUS) inhaler, Use as instructed, Disp: 1 each, Rfl: 0   zonisamide (ZONEGRAN) 100 MG capsule, Take 2 capsules twice a day, Disp: 360 capsule, Rfl: 3   albuterol (VENTOLIN HFA) 108 (90 Base) MCG/ACT inhaler, Inhale 1-2 puffs into the lungs every 6 (six) hours as needed for wheezing or shortness of breath. (Patient not taking: Reported on 12/07/2020), Disp: 8 g, Rfl: 0   ondansetron (ZOFRAN ODT) 4 MG disintegrating tablet, Take 1 tablet (4 mg total) by mouth every 6 (six) hours as needed for nausea or vomiting. (Patient not taking: Reported on 12/07/2020), Disp: 30 tablet, Rfl: 3   Peppermint Oil (IBGARD) 90 MG CPCR, Take as directed (Patient not taking: Reported on 12/07/2020), Disp: 8 capsule, Rfl: 0 Medication Side Effects: none  Family Medical/ Social History: Changes? No  MENTAL HEALTH EXAM:  There were no vitals taken for this visit.There is no height or weight on file to calculate BMI.  General Appearance: Casual, Neat, Well Groomed and Obese  Eye Contact:  Good  Speech:  Clear and Coherent and Normal Rate  Volume:  Normal  Mood:  Depressed  Affect:  Depressed  Thought Process:  Goal Directed and Descriptions of Associations: Intact  Orientation:  Full (Time, Place, and Person)  Thought Content: Logical   Suicidal Thoughts:  No  Homicidal Thoughts:  No  Memory:  Immediate;   Fair Recent;   Fair  Judgement:  Good  Insight:  Good  Psychomotor Activity:   uses cane and walks slowly  Concentration:  Concentration: Good  Recall:  Good  Fund of  Knowledge: Good  Language: Good  Assets:  Desire for Improvement  ADL's:  Intact  Cognition: WNL  Prognosis:  Good    DIAGNOSES:    ICD-10-CM   1. Generalized anxiety disorder  F41.1     2. Insomnia, unspecified type  G47.00     3. Major depressive disorder, recurrent episode, moderate (HCC)  F33.1     4. Obstructive sleep apnea  G47.33     5. Chronic nausea  R11.0        Receiving Psychotherapy: No    RECOMMENDATIONS:  PDMP was reviewed. Last Ativan 12/05/2019 I provided 40 minutes of face to face time during this encounter, including time spent before and after the visit in records review, medical decision making, and charting.  Dr. Trinna Balloon had contacted me concerning Conchetta's chronic gastrointestinal issues, wondering if adding BuSpar would be appropriate due to her anxiety that could be causing the stomach issues.  Merrill and I discussed this, I believe it is an appropriate approach.  Hopefully it will help the generalized anxiety so that she does not feel the need for the Ativan.  She takes the Ativan so infrequently that honestly it would be more helpful if she would take it more often.  I understand her hesitancy though and respect that.  Adding the BuSpar may decrease the need for a rescue medication at all.  We discussed the benefits, risks, and side effects and she accepts. Start BuSpar 15 mg 1/3 tablet twice daily for 1 week, then increase to 2/3 tablet twice daily for 1 week, then increase to 1 tablet twice daily for anxiety. Continue Ativan 1 mg, 1 p.o. 4 times daily as needed anxiety. Continue Zoloft 100 mg, 2 p.o. daily. Continue multivitamin, B complex, vitamin D, and Fish oil. Recommend she get back in counseling. Return in 6 to 8 weeks.  Donnal Moat, PA-C

## 2020-12-07 NOTE — Patient Instructions (Signed)
On the BuSpar 15 mg, take 1/3 pill twice daily for 1 week, then increase to 2/3 pill twice daily for 1 week, then increase to 1 pill twice daily until we see each other of the next time.

## 2020-12-14 DIAGNOSIS — J3081 Allergic rhinitis due to animal (cat) (dog) hair and dander: Secondary | ICD-10-CM | POA: Diagnosis not present

## 2020-12-14 DIAGNOSIS — J301 Allergic rhinitis due to pollen: Secondary | ICD-10-CM | POA: Diagnosis not present

## 2020-12-14 DIAGNOSIS — J3089 Other allergic rhinitis: Secondary | ICD-10-CM | POA: Diagnosis not present

## 2020-12-17 ENCOUNTER — Encounter: Payer: Self-pay | Admitting: Physical Medicine and Rehabilitation

## 2020-12-17 ENCOUNTER — Other Ambulatory Visit: Payer: Self-pay

## 2020-12-17 ENCOUNTER — Ambulatory Visit: Payer: Self-pay

## 2020-12-17 ENCOUNTER — Ambulatory Visit (INDEPENDENT_AMBULATORY_CARE_PROVIDER_SITE_OTHER): Payer: Medicare HMO | Admitting: Physical Medicine and Rehabilitation

## 2020-12-17 VITALS — BP 130/75 | HR 65

## 2020-12-17 DIAGNOSIS — M5412 Radiculopathy, cervical region: Secondary | ICD-10-CM | POA: Diagnosis not present

## 2020-12-17 MED ORDER — BETAMETHASONE SOD PHOS & ACET 6 (3-3) MG/ML IJ SUSP
12.0000 mg | Freq: Once | INTRAMUSCULAR | Status: AC
  Administered 2020-12-17: 12 mg

## 2020-12-17 NOTE — Progress Notes (Signed)
Beverly Ballard - 75 y.o. female MRN [id number]  Date of birth: [birth year]  Office Visit Note: Visit Date: 12/17/2020 PCP: Caren Macadam, MD Referred by: Caren Macadam, MD  Subjective: Chief Complaint  Patient presents with   Neck - Pain   Left Shoulder - Pain   Left Arm - Pain   HPI:  Beverly Ballard is a 75 y.o. female who comes in today at the request of Dr. Eduard Roux for planned Left C7-T1 Cervical Interlaminar epidural steroid injection with fluoroscopic guidance.  The patient has failed conservative care including home exercise, medications, time and activity modification.  This injection will be diagnostic and hopefully therapeutic.  Please see requesting physician notes for further details and justification.  ROS Otherwise per HPI.  Assessment & Plan: Visit Diagnoses:    ICD-10-CM   1. Cervical radiculopathy  M54.12 XR C-ARM NO REPORT    Epidural Steroid injection    betamethasone acetate-betamethasone sodium phosphate (CELESTONE) injection 12 mg      Plan: No additional findings.   Meds & Orders:  Meds ordered this encounter  Medications   betamethasone acetate-betamethasone sodium phosphate (CELESTONE) injection 12 mg    Orders Placed This Encounter  Procedures   XR C-ARM NO REPORT   Epidural Steroid injection    Follow-up: Return if symptoms worsen or fail to improve.   Procedures: No procedures performed  Cervical Epidural Steroid Injection - Interlaminar Approach with Fluoroscopic Guidance  Patient: Beverly Ballard      Date of Birth: April 11, 1946 MRN: [id number] PCP: Caren Macadam, MD      Visit Date: 12/17/2020   Universal Protocol:    Date/Time: 09/09/225:53 AM  Consent Given By: the patient  Position: PRONE  Additional Comments: Vital signs were monitored before and after the procedure. Patient was prepped and draped in the usual sterile fashion. The correct patient, procedure, and site was verified.   Injection Procedure  Details:   Procedure diagnoses: Cervical radiculopathy [M54.12]    Meds Administered:  Meds ordered this encounter  Medications   betamethasone acetate-betamethasone sodium phosphate (CELESTONE) injection 12 mg     Laterality: Left  Location/Site: C7-T1  Needle: 3.5 in., 20 ga. Tuohy  Needle Placement: Paramedian epidural space  Findings:  -Comments: Excellent flow of contrast into the epidural space.  Procedure Details: Using a paramedian approach from the side mentioned above, the region overlying the inferior lamina was localized under fluoroscopic visualization and the soft tissues overlying this structure were infiltrated with 4 ml. of 1% Lidocaine without Epinephrine. A # 20 gauge, Tuohy needle was inserted into the epidural space using a paramedian approach.  The epidural space was localized using loss of resistance along with contralateral oblique bi-planar fluoroscopic views.  After negative aspirate for air, blood, and CSF, a 2 ml. volume of Isovue-250 was injected into the epidural space and the flow of contrast was observed. Radiographs were obtained for documentation purposes.   The injectate was administered into the level noted above.  Additional Comments:  No complications occurred Dressing: 2 x 2 sterile gauze and Band-Aid    Post-procedure details: Patient was observed during the procedure. Post-procedure instructions were reviewed.  Patient left the clinic in stable condition.   Clinical History: MRI CERVICAL SPINE WITHOUT CONTRAST   TECHNIQUE: Multiplanar, multisequence MR imaging of the cervical spine was performed. No intravenous contrast was administered.   COMPARISON:  MRI of the cervical spine October 01, 2017.   FINDINGS: Alignment: Straightening of the  cervical curvature.   Vertebrae: No fracture, evidence of discitis, or bone lesion.   Cord: Normal signal and morphology.   Posterior Fossa, vertebral arteries, paraspinal tissues:  Negative.   Disc levels:   C2-3: No spinal canal or neural foraminal stenosis.   C3-4: Facet degenerative changes. No spinal canal or neural foraminal stenosis.   C4-5: Small posterior disc protrusion and mild facet degenerative changes without significant spinal canal or neural foraminal stenosis.   C5-6: Mild loss of disc height, a posterior disc osteophyte complex without significant spinal canal stenosis. Uncovertebral and facet degenerative changes resulting in mild right and severe left neural foraminal narrowing, unchanged from prior.   C6-7: Small posterior disc osteophyte complex. No significant spinal canal or neural foraminal stenosis.   C7-T1: Mild facet degenerative changes. No spinal canal or neural foraminal stenosis.   IMPRESSION: 1. Mild degenerative changes of the cervical spine, more pronounced at C5-6 where there is severe left neural foraminal narrowing. No significant change from prior MRI. 2. No high-grade spinal canal stenosis at any level.     Electronically Signed   By: Pedro Earls M.D.   On: 11/16/2020 10:31     Objective:  VS:  HT:    WT:   BMI:     BP:130/75  HR:65bpm  TEMP: ( )  RESP:  Physical Exam Vitals and nursing note reviewed.  Constitutional:      General: She is not in acute distress.    Appearance: Normal appearance. She is not ill-appearing.  HENT:     Head: Normocephalic and atraumatic.     Right Ear: External ear normal.     Left Ear: External ear normal.  Eyes:     Extraocular Movements: Extraocular movements intact.  Cardiovascular:     Rate and Rhythm: Normal rate.     Pulses: Normal pulses.  Musculoskeletal:     Cervical back: Tenderness present. No rigidity.     Right lower leg: No edema.     Left lower leg: No edema.     Comments: Patient has good strength in the upper extremities including 5 out of 5 strength in wrist extension long finger flexion and APB.  There is no atrophy of the hands  intrinsically.  There is a negative Hoffmann's test.   Lymphadenopathy:     Cervical: No cervical adenopathy.  Skin:    Findings: No erythema, lesion or rash.  Neurological:     General: No focal deficit present.     Mental Status: She is alert and oriented to person, place, and time.     Sensory: No sensory deficit.     Motor: No weakness or abnormal muscle tone.     Coordination: Coordination normal.  Psychiatric:        Mood and Affect: Mood normal.        Behavior: Behavior normal.     Imaging: No results found.

## 2020-12-17 NOTE — Progress Notes (Signed)
Pt state neck pain that travels down her left shoulder and arm. Pt state any movement with her neck and shoulder makes the pain worse. Pt state she takes pain meds anduses heat to help ease her pain.  Numeric Pain Rating Scale and Functional Assessment Average Pain 5   In the last MONTH (on 0-10 scale) has pain interfered with the following?  1. General activity like being  able to carry out your everyday physical activities such as walking, climbing stairs, carrying groceries, or moving a chair?  Rating(10)   +Driver, -BT, -Dye Allergies.

## 2020-12-17 NOTE — Patient Instructions (Signed)

## 2020-12-26 ENCOUNTER — Encounter: Payer: Self-pay | Admitting: Family Medicine

## 2020-12-28 DIAGNOSIS — J3089 Other allergic rhinitis: Secondary | ICD-10-CM | POA: Diagnosis not present

## 2020-12-28 DIAGNOSIS — J301 Allergic rhinitis due to pollen: Secondary | ICD-10-CM | POA: Diagnosis not present

## 2020-12-28 DIAGNOSIS — J3081 Allergic rhinitis due to animal (cat) (dog) hair and dander: Secondary | ICD-10-CM | POA: Diagnosis not present

## 2020-12-28 DIAGNOSIS — G4733 Obstructive sleep apnea (adult) (pediatric): Secondary | ICD-10-CM | POA: Diagnosis not present

## 2020-12-28 NOTE — Procedures (Signed)
Cervical Epidural Steroid Injection - Interlaminar Approach with Fluoroscopic Guidance  Patient: Beverly Ballard      Date of Birth: 05/19/1945 MRN: [id number] PCP: Caren Macadam, MD      Visit Date: 12/17/2020   Universal Protocol:    Date/Time: 09/09/225:53 AM  Consent Given By: the patient  Position: PRONE  Additional Comments: Vital signs were monitored before and after the procedure. Patient was prepped and draped in the usual sterile fashion. The correct patient, procedure, and site was verified.   Injection Procedure Details:   Procedure diagnoses: Cervical radiculopathy [M54.12]    Meds Administered:  Meds ordered this encounter  Medications   betamethasone acetate-betamethasone sodium phosphate (CELESTONE) injection 12 mg     Laterality: Left  Location/Site: C7-T1  Needle: 3.5 in., 20 ga. Tuohy  Needle Placement: Paramedian epidural space  Findings:  -Comments: Excellent flow of contrast into the epidural space.  Procedure Details: Using a paramedian approach from the side mentioned above, the region overlying the inferior lamina was localized under fluoroscopic visualization and the soft tissues overlying this structure were infiltrated with 4 ml. of 1% Lidocaine without Epinephrine. A # 20 gauge, Tuohy needle was inserted into the epidural space using a paramedian approach.  The epidural space was localized using loss of resistance along with contralateral oblique bi-planar fluoroscopic views.  After negative aspirate for air, blood, and CSF, a 2 ml. volume of Isovue-250 was injected into the epidural space and the flow of contrast was observed. Radiographs were obtained for documentation purposes.   The injectate was administered into the level noted above.  Additional Comments:  No complications occurred Dressing: 2 x 2 sterile gauze and Band-Aid    Post-procedure details: Patient was observed during the procedure. Post-procedure instructions  were reviewed.  Patient left the clinic in stable condition.

## 2021-01-01 ENCOUNTER — Ambulatory Visit (INDEPENDENT_AMBULATORY_CARE_PROVIDER_SITE_OTHER)
Admission: RE | Admit: 2021-01-01 | Discharge: 2021-01-01 | Disposition: A | Discharge status: Home / Self Care | Payer: Medicare HMO | Source: Ambulatory Visit | Attending: Family Medicine | Admitting: Family Medicine

## 2021-01-01 ENCOUNTER — Other Ambulatory Visit: Payer: Self-pay

## 2021-01-01 DIAGNOSIS — J849 Interstitial pulmonary disease, unspecified: Secondary | ICD-10-CM | POA: Diagnosis not present

## 2021-01-01 DIAGNOSIS — J479 Bronchiectasis, uncomplicated: Secondary | ICD-10-CM | POA: Diagnosis not present

## 2021-01-01 DIAGNOSIS — R0602 Shortness of breath: Secondary | ICD-10-CM

## 2021-01-01 DIAGNOSIS — I700 Atherosclerosis of aorta: Secondary | ICD-10-CM | POA: Diagnosis not present

## 2021-01-02 ENCOUNTER — Other Ambulatory Visit: Payer: Self-pay | Admitting: Physician Assistant

## 2021-01-03 DIAGNOSIS — J3089 Other allergic rhinitis: Secondary | ICD-10-CM | POA: Diagnosis not present

## 2021-01-03 DIAGNOSIS — J3081 Allergic rhinitis due to animal (cat) (dog) hair and dander: Secondary | ICD-10-CM | POA: Diagnosis not present

## 2021-01-03 DIAGNOSIS — J301 Allergic rhinitis due to pollen: Secondary | ICD-10-CM | POA: Diagnosis not present

## 2021-01-04 ENCOUNTER — Telehealth: Payer: Self-pay | Admitting: Gastroenterology

## 2021-01-04 NOTE — Telephone Encounter (Signed)
I was made aware by nursing staff that this patient never got the results of her colonoscopy pathology. I looked through Epic and the result I don't believe was ever sent to me. She had 2 small polyps removed, one small adenoma. Given this result and her age, she does not warrant any further surveillance exams (would not be due until age 75, at which time most people do not have routine surveillance exams). Patient will be notified.   Cyril Mourning / Early Chars - FYI - no recall for surveillance for this patient due to age and only low risk adenoma on her last exam. thanks

## 2021-01-04 NOTE — Telephone Encounter (Signed)
noted 

## 2021-01-08 ENCOUNTER — Encounter: Payer: Self-pay | Admitting: Pulmonary Disease

## 2021-01-08 ENCOUNTER — Ambulatory Visit: Payer: Medicare HMO | Admitting: Pulmonary Disease

## 2021-01-08 ENCOUNTER — Other Ambulatory Visit: Payer: Self-pay

## 2021-01-08 VITALS — BP 98/50 | HR 75 | Temp 98.3°F | Ht 65.0 in | Wt 230.2 lb

## 2021-01-08 DIAGNOSIS — R0602 Shortness of breath: Secondary | ICD-10-CM | POA: Diagnosis not present

## 2021-01-08 NOTE — Patient Instructions (Signed)
Your CPAP seems to be working well -Continue CPAP  Graded exercises as tolerated  Your CT scan as we reviewed is similar to what was there about a year ago-hypersensitivity, this was the reason we tried steroids -If there is worsening symptoms we can try steroids again  We will get a breathing study in about 3 months  I will see you in 3 months  Call with any significant concerns

## 2021-01-08 NOTE — Progress Notes (Signed)
Beverly Ballard    [id number]    1945-08-15  Primary Care Physician:Koberlein, Steele Berg, MD  Referring Physician: Caren Macadam, MD [place],  [place] [address]  Chief complaint:    Breathing feels about the same Shortness of breath with exertion History of hypersensitivity pneumonitis History of obstructive sleep apnea  HPI:  Breathing for the same  Diagnosed with hypersensitive pneumonitis Was treated with a course of steroids but wanted off the steroids because it was not helping and was feeling more poorly being on steroids  Did have a repeat high-res CT showing findings consistent with chronic hypersensitivity pneumonitis No new infiltrative process Breathing feels about the same  Continues to use CPAP regularly, does not sleep through the night Tolerates it okay  Has not a recent fall Did benefit from physical therapy  Did have some injection for back pain for left arm pain Having some recurrence of symptoms Encouraged to call the office that helped helped her with the injection  Has had some dizziness, lightheadedness -On Lasix -No other medication noted in a list of may be contributing to this  On antidepressants-on Zoloft  She does have a history of chronic shortness of breath Chronic back pain-on Flexeril-she is not using this as much She is a couple of back surgeries in the past Chronic knee pain  Recently had a bronchoscopy-increased neutrophils on BAL, transbronchial biopsies were mainly bronchial tissue  She did smoke in the past-claims never to be a heavy smoker She does have a history of allergies for which she receives allergy shots  Pets: No pets Occupation: Worked retail Exposures: No mold exposure Smoking history: Quit smoking many years ago Travel history: Visits to [place] frequently Relevant family history: No contributory family history of lung disease  Outpatient Encounter Medications as of 01/08/2021   Medication Sig   albuterol (VENTOLIN HFA) 108 (90 Base) MCG/ACT inhaler Inhale 1-2 puffs into the lungs every 6 (six) hours as needed for wheezing or shortness of breath.   b complex vitamins tablet Take 1 tablet by mouth daily.   beta carotene w/minerals (OCUVITE) tablet Take 1 tablet by mouth daily.   Budeson-Glycopyrrol-Formoterol (BREZTRI AEROSPHERE) 160-9-4.8 MCG/ACT AERO Inhale 2 puffs into the lungs 2 (two) times daily.   busPIRone (BUSPAR) 15 MG tablet 1 tablet (15 mg) by mouth TWICE DAILY.   Cholecalciferol (VITAMIN D3 PO) Take by mouth.   cyclobenzaprine (FLEXERIL) 10 MG tablet Take 1 tablet (10 mg total) by mouth 2 (two) times daily as needed for muscle spasms.   EPINEPHrine 0.3 mg/0.3 mL IJ SOAJ injection SMARTSIG:Injection As Directed   esomeprazole (NEXIUM) 20 MG capsule Take 2 capsules (40 mg total) by mouth daily before breakfast. (Patient taking differently: Take 20 mg by mouth daily before breakfast.)   furosemide (LASIX) 20 MG tablet TAKE 1 TABLET BY MOUTH IN THE MORNING   hyoscyamine (LEVSIN SL) 0.125 MG SL tablet Place 1-2 tablets (0.125-0.25 mg total) under the tongue every 6 (six) hours as needed.   levocetirizine (XYZAL) 5 MG tablet Take 5 mg by mouth daily.    levothyroxine (SYNTHROID) 88 MCG tablet Take 1 tablet (88 mcg total) by mouth daily.   lisinopril (ZESTRIL) 5 MG tablet TAKE 1 TABLET BY MOUTH EVERY DAY   LORazepam (ATIVAN) 1 MG tablet Take 1 tablet (1 mg total) by mouth every 6 (six) hours as needed for anxiety.   ondansetron (ZOFRAN ODT) 4 MG disintegrating tablet Take  1 tablet (4 mg total) by mouth every 6 (six) hours as needed for nausea or vomiting.   Peppermint Oil (IBGARD) 90 MG CPCR Take as directed   pravastatin (PRAVACHOL) 20 MG tablet TAKE 1 TABLET (20 MG TOTAL) BY MOUTH DAILY.   sertraline (ZOLOFT) 100 MG tablet Take 2 tablets (200 mg total) by mouth daily.   Spacer/Aero-Holding Chambers (AEROCHAMBER PLUS) inhaler Use as instructed   zonisamide  (ZONEGRAN) 100 MG capsule Take 2 capsules twice a day   No facility-administered encounter medications on file as of 01/08/2021.    Allergies as of 01/08/2021 - Review Complete 01/08/2021  Allergen Reaction Noted   Lamotrigine Swelling 07/03/2015   Bupropion Other (See Comments) 05/02/2016   Carbamazepine Other (See Comments) 07/03/2015   Levetiracetam  07/03/2015   Tramadol Other (See Comments) 05/02/2016   Adhesive [tape]  09/08/2014   Amitriptyline  09/19/2020   Clarithromycin     Doxycycline     Nickel  08/11/2014   Other  08/11/2014   Topamax [topiramate] Nausea And Vomiting 12/05/2019   Estradiol Rash 07/03/2015   Latex Rash 01/28/2018   Phenytoin sodium extended Other (See Comments) 07/03/2015    Past Medical History:  Diagnosis Date   Allergy    Anxiety and depression 12/19/2009   Arthritis    Cancer ([place])    vaginal   Constipation    Degenerative disorder of eye    Diverticulosis    Essential hypertension, benign 06/26/2009   GERD 05/27/2007   takes Nexium daily   Heart murmur    yrs ago   History of gout    History of migraine many yrs ago   Hyperlipidemia    takes Pravastatin daily   Hypothyroidism    Insomnia    but doesn't take any meds   LOW BACK PAIN 05/27/2007   Numbness    left toes    Osteoporosis    Pneumonia    Seizures ([place])    last 1983, none since then- last seizure 1988 per pt    Sleep apnea    wears CPAP    Past Surgical History:  Procedure Laterality Date   ADENOIDECTOMY     BACK SURGERY     lumbar x2   BRONCHIAL BIOPSY  09/12/2019   Procedure: BRONCHIAL BIOPSIES;  Surgeon: Laurin Coder, MD;  Location: [facility];  Service: Endoscopy;;   BRONCHIAL WASHINGS  09/12/2019   Procedure: BRONCHIAL WASHINGS;  Surgeon: Laurin Coder, MD;  Location: [facility];  Service: Endoscopy;;   COLONOSCOPY  2009   ESOPHAGOGASTRODUODENOSCOPY     Heel spurs Bilateral    HEMOSTASIS CONTROL  09/12/2019   Procedure: HEMOSTASIS CONTROL;   Surgeon: Laurin Coder, MD;  Location: [facility];  Service: Endoscopy;;  epi   KNEE ARTHROPLASTY Left 11/07/2013   Procedure: COMPUTER ASSISTED TOTAL KNEE ARTHROPLASTY;  Surgeon: Marybelle Killings, MD;  Location: [facility];  Service: Orthopedics;  Laterality: Left;  Left Total Knee Arthroplasty, Cemented, Computer Assist   TONSILLECTOMY     TOTAL KNEE REVISION Left 09/19/2014   Procedure: LEFT TOTAL KNEE REVISION;  Surgeon: Leandrew Koyanagi, MD;  Location: [facility];  Service: Orthopedics;  Laterality: Left;   UPPER GASTROINTESTINAL ENDOSCOPY     VIDEO BRONCHOSCOPY N/A 09/12/2019   Procedure: VIDEO BRONCHOSCOPY WITH FLUORO;  Surgeon: Laurin Coder, MD;  Location: [facility];  Service: Endoscopy;  Laterality: N/A;    Family History  Problem Relation Age of Onset   Aortic dissection Father  Arthritis Mother    Stroke Mother 19   Heart murmur Other    Throat cancer Sister    Colon cancer Neg Hx    Esophageal cancer Neg Hx    Stomach cancer Neg Hx    Rectal cancer Neg Hx    Colon polyps Neg Hx     Social History   Socioeconomic History   Marital status: Married    Spouse name: Not on file   Number of children: 1   Years of education: Not on file   Highest education level: Not on file  Occupational History   Occupation: Retired  Tobacco Use   Smoking status: Former    Packs/day: 0.25    Years: 20.00    Pack years: 5.00    Types: Cigarettes    Quit date: 09/08/1978    Years since quitting: 42.3   Smokeless tobacco: Never   Tobacco comments:    quit smoking 24yr ago  Vaping Use   Vaping Use: Never used  Substance and Sexual Activity   Alcohol use: No   Drug use: No   Sexual activity: Never    Birth control/protection: Post-menopausal  Other Topics Concern   Not on file  Social History Narrative   Right handed    Lives with husband    Social Determinants of Health   Financial Resource Strain: Low Risk    Difficulty of Paying Living Expenses: Not hard at all  Food  Insecurity: No Food Insecurity   Worried About RCharity fundraiserin the Last Year: Never true   Ran Out of Food in the Last Year: Never true  Transportation Needs: No Transportation Needs   Lack of Transportation (Medical): No   Lack of Transportation (Non-Medical): No  Physical Activity: Inactive   Days of Exercise per Week: 0 days   Minutes of Exercise per Session: 0 min  Stress: Stress Concern Present   Feeling of Stress : To some extent  Social Connections: Socially Isolated   Frequency of Communication with Friends and Family: Twice a week   Frequency of Social Gatherings with Friends and Family: Never   Attends Religious Services: Never   APrintmaker No   Attends CMusic therapist Never   Marital Status: Married  IHuman resources officerViolence: Not At Risk   Fear of Current or Ex-Partner: No   Emotionally Abused: No   Physically Abused: No   Sexually Abused: No    Review of Systems  Constitutional:  Positive for fatigue.  HENT:  Negative for postnasal drip and rhinorrhea.   Eyes: Negative.   Respiratory:  Positive for apnea and shortness of breath. Negative for cough.   Cardiovascular: Negative.   Gastrointestinal: Negative.   Endocrine: Negative.   Genitourinary: Negative.   Musculoskeletal:  Positive for arthralgias and back pain.       Knee pain  Allergic/Immunologic: Positive for environmental allergies.  Neurological: Negative.   Hematological: Negative.   Psychiatric/Behavioral:  Positive for dysphoric mood and sleep disturbance.   All other systems reviewed and are negative.  Vitals:   01/08/21 1137  BP: (!) 98/50  Pulse: 75  Temp: 98.3 F (36.8 C)  SpO2: 97%     Physical Exam Constitutional:      General: She is not in acute distress.    Appearance: She is well-developed. She is obese. She is not diaphoretic.  HENT:     Head: Normocephalic and atraumatic.  Eyes:  General:        Right eye: No  discharge.        Left eye: No discharge.  Neck:     Thyroid: No thyromegaly.     Trachea: No tracheal deviation.  Cardiovascular:     Rate and Rhythm: Normal rate and regular rhythm.     Heart sounds: No murmur heard.   No friction rub.  Pulmonary:     Effort: Pulmonary effort is normal. No respiratory distress.     Breath sounds: No stridor. No wheezing or rhonchi.  Musculoskeletal:     Cervical back: No rigidity or tenderness.     Right lower leg: Edema present.     Left lower leg: Edema present.  Neurological:     General: No focal deficit present.     Mental Status: She is alert.  Psychiatric:        Mood and Affect: Mood normal.  Data reviewed:  Compliance about 93 CPAP set between 5 and 15 Residual AHI of 0.9  Recent echocardiogram reviewed showing diastolic dysfunction, normal ejection fraction 10/18/2019  Recent CT scan of the chest reviewed-12/21/2019-scarring upper lobes unchanged-reviewed by myself High-resolution CT performed 01/01/2021 reviewed with the patient  Assessment:   Chronic shortness of breath-multifactorial -Musculoskeletal pain and discomfort -Deconditioning -Hypersensitivity pneumonitis  Chronic hypersensitivity pneumonitis -Did not tolerate steroids -Breathing is stable  Significant back pain and knee pain -Chronic symptoms  Previous smoker, significant exposure to secondhand smoke in the past-no underlying lung disease known -PFT does not reveal any significant obstruction -We will repeat PFT at next visit  History of allergies for which she receives allergy shots  Obstructive sleep apnea -Compliant with CPAP use  Lightheadedness, dizziness- -I did encourage her to stay off Lasix 20 mg for couple of days and then resume  Encouraged to call office that assisted her with injections for arm pain  Plan/Recommendations:  Continue albuterol as needed  Continue CPAP  Importance of graded exercises was discussed, exercises  encouraged  I will see her back in about 3 months  Encouraged to call with any significant concerns  I spent 30 minutes dedicated to the care of this patient on the date of this encounter to include previsit review of records, face-to-face time with the patient discussing conditions above, post visit ordering of testing, clinical documentation with electronic health record and communicated necessary findings to members of the patient's care team   Sherrilyn Rist MD [facility] 01/08/2021, 11:42 AM  CC: Caren Macadam, MD

## 2021-01-10 ENCOUNTER — Other Ambulatory Visit: Payer: Medicare HMO

## 2021-01-10 ENCOUNTER — Other Ambulatory Visit: Payer: Self-pay

## 2021-01-10 ENCOUNTER — Ambulatory Visit
Admission: RE | Admit: 2021-01-10 | Discharge: 2021-01-10 | Disposition: A | Discharge status: Home / Self Care | Payer: Medicare HMO | Source: Ambulatory Visit | Attending: Obstetrics & Gynecology | Admitting: Obstetrics & Gynecology

## 2021-01-10 DIAGNOSIS — Z780 Asymptomatic menopausal state: Secondary | ICD-10-CM | POA: Diagnosis not present

## 2021-01-10 DIAGNOSIS — E2839 Other primary ovarian failure: Secondary | ICD-10-CM

## 2021-01-10 DIAGNOSIS — M8589 Other specified disorders of bone density and structure, multiple sites: Secondary | ICD-10-CM | POA: Diagnosis not present

## 2021-01-16 ENCOUNTER — Other Ambulatory Visit: Payer: Medicare HMO

## 2021-01-19 DIAGNOSIS — I499 Cardiac arrhythmia, unspecified: Secondary | ICD-10-CM

## 2021-01-19 HISTORY — DX: Cardiac arrhythmia, unspecified: I49.9

## 2021-01-21 DIAGNOSIS — H1045 Other chronic allergic conjunctivitis: Secondary | ICD-10-CM | POA: Diagnosis not present

## 2021-01-21 DIAGNOSIS — J3089 Other allergic rhinitis: Secondary | ICD-10-CM | POA: Diagnosis not present

## 2021-01-21 DIAGNOSIS — R059 Cough, unspecified: Secondary | ICD-10-CM | POA: Diagnosis not present

## 2021-01-21 DIAGNOSIS — J3081 Allergic rhinitis due to animal (cat) (dog) hair and dander: Secondary | ICD-10-CM | POA: Diagnosis not present

## 2021-01-21 DIAGNOSIS — J301 Allergic rhinitis due to pollen: Secondary | ICD-10-CM | POA: Diagnosis not present

## 2021-01-22 ENCOUNTER — Other Ambulatory Visit: Payer: Self-pay

## 2021-01-22 ENCOUNTER — Ambulatory Visit (INDEPENDENT_AMBULATORY_CARE_PROVIDER_SITE_OTHER): Payer: Medicare HMO

## 2021-01-22 DIAGNOSIS — Z0000 Encounter for general adult medical examination without abnormal findings: Secondary | ICD-10-CM

## 2021-01-22 NOTE — Progress Notes (Signed)
Virtual Visit via Telephone Note  I connected with  Beverly Ballard on 01/22/21 at  1:00 PM EDT by telephone and verified that I am speaking with the correct person using two identifiers.  Location: Patient: Home Provider: Office Persons participating in the virtual visit: patient/Nurse Health Advisor   I discussed the limitations, risks, security and privacy concerns of performing an evaluation and management service by telephone and the availability of in person appointments. The patient expressed understanding and agreed to proceed.  Interactive audio and video telecommunications were attempted between this nurse and patient, however failed, due to patient having technical difficulties OR patient did not have access to video capability.  We continued and completed visit with audio only.  Some vital signs may be absent or patient reported.   Willette Brace, LPN   Subjective:   DAIANNA Ballard is a 75 y.o. female who presents for Medicare Annual (Subsequent) preventive examination.  Review of Systems     Cardiac Risk Factors include: advanced age (>64men, >35 women);obesity (BMI >30kg/m2);hypertension;sedentary lifestyle;dyslipidemia     Objective:    There were no vitals filed for this visit. There is no height or weight on file to calculate BMI.  Advanced Directives 01/22/2021 11/07/2020 07/25/2020 03/03/2020 02/14/2020 01/17/2020 12/20/2019  Does Patient Have a Medical Advance Directive? Yes Yes Yes Yes Yes Yes No  Type of Academic librarian Living will [place];Out of facility DNR (pink MOST or yellow form);Living will - [place];Out of facility DNR (pink MOST or yellow form);Living will [place];Living will -  Does patient want to make changes to medical advance directive? - - - - - - -  Copy of [place] in Chart? No - copy requested - - - - No - copy requested -  Would  patient like information on creating a medical advance directive? - - - - - - No - Patient declined    Current Medications (verified) Outpatient Encounter Medications as of 01/22/2021  Medication Sig   albuterol (VENTOLIN HFA) 108 (90 Base) MCG/ACT inhaler Inhale 1-2 puffs into the lungs every 6 (six) hours as needed for wheezing or shortness of breath.   b complex vitamins tablet Take 1 tablet by mouth daily.   beta carotene w/minerals (OCUVITE) tablet Take 1 tablet by mouth daily.   Budeson-Glycopyrrol-Formoterol (BREZTRI AEROSPHERE) 160-9-4.8 MCG/ACT AERO Inhale 2 puffs into the lungs 2 (two) times daily.   busPIRone (BUSPAR) 15 MG tablet 1 tablet (15 mg) by mouth TWICE DAILY.   Cholecalciferol (VITAMIN D3 PO) Take by mouth.   cyclobenzaprine (FLEXERIL) 10 MG tablet Take 1 tablet (10 mg total) by mouth 2 (two) times daily as needed for muscle spasms.   EPINEPHrine 0.3 mg/0.3 mL IJ SOAJ injection SMARTSIG:Injection As Directed   esomeprazole (NEXIUM) 20 MG capsule Take 2 capsules (40 mg total) by mouth daily before breakfast. (Patient taking differently: Take 20 mg by mouth daily before breakfast.)   furosemide (LASIX) 20 MG tablet TAKE 1 TABLET BY MOUTH IN THE MORNING   hyoscyamine (LEVSIN SL) 0.125 MG SL tablet Place 1-2 tablets (0.125-0.25 mg total) under the tongue every 6 (six) hours as needed.   levocetirizine (XYZAL) 5 MG tablet Take 5 mg by mouth daily.    levothyroxine (SYNTHROID) 88 MCG tablet Take 1 tablet (88 mcg total) by mouth daily.   lisinopril (ZESTRIL) 5 MG tablet TAKE 1 TABLET BY MOUTH EVERY DAY   LORazepam (  ATIVAN) 1 MG tablet Take 1 tablet (1 mg total) by mouth every 6 (six) hours as needed for anxiety.   ondansetron (ZOFRAN ODT) 4 MG disintegrating tablet Take 1 tablet (4 mg total) by mouth every 6 (six) hours as needed for nausea or vomiting.   Peppermint Oil (IBGARD) 90 MG CPCR Take as directed   pravastatin (PRAVACHOL) 20 MG tablet TAKE 1 TABLET (20 MG TOTAL) BY MOUTH  DAILY.   sertraline (ZOLOFT) 100 MG tablet Take 2 tablets (200 mg total) by mouth daily.   Spacer/Aero-Holding Chambers (AEROCHAMBER PLUS) inhaler Use as instructed   zonisamide (ZONEGRAN) 100 MG capsule Take 2 capsules twice a day   No facility-administered encounter medications on file as of 01/22/2021.    Allergies (verified) Lamotrigine, Bupropion, Carbamazepine, Levetiracetam, Tramadol, Adhesive [tape], Amitriptyline, Clarithromycin, Doxycycline, Nickel, Other, Topamax [topiramate], Estradiol, Latex, and Phenytoin sodium extended   History: Past Medical History:  Diagnosis Date   Allergy    Anxiety and depression 12/19/2009   Arthritis    Cancer ([place])    vaginal   Constipation    Degenerative disorder of eye    Diverticulosis    Essential hypertension, benign 06/26/2009   GERD 05/27/2007   takes Nexium daily   Heart murmur    yrs ago   History of gout    History of migraine many yrs ago   Hyperlipidemia    takes Pravastatin daily   Hypothyroidism    Insomnia    but doesn't take any meds   LOW BACK PAIN 05/27/2007   Numbness    left toes    Osteoporosis    Pneumonia    Seizures ([place])    last 1983, none since then- last seizure 1988 per pt    Sleep apnea    wears CPAP   Past Surgical History:  Procedure Laterality Date   ADENOIDECTOMY     BACK SURGERY     lumbar x2   BRONCHIAL BIOPSY  09/12/2019   Procedure: BRONCHIAL BIOPSIES;  Surgeon: Laurin Coder, MD;  Location: [facility];  Service: Endoscopy;;   BRONCHIAL WASHINGS  09/12/2019   Procedure: BRONCHIAL WASHINGS;  Surgeon: Laurin Coder, MD;  Location: [facility];  Service: Endoscopy;;   COLONOSCOPY  2009   ESOPHAGOGASTRODUODENOSCOPY     Heel spurs Bilateral    HEMOSTASIS CONTROL  09/12/2019   Procedure: HEMOSTASIS CONTROL;  Surgeon: Laurin Coder, MD;  Location: [facility];  Service: Endoscopy;;  epi   KNEE ARTHROPLASTY Left 11/07/2013   Procedure: COMPUTER ASSISTED TOTAL KNEE  ARTHROPLASTY;  Surgeon: Marybelle Killings, MD;  Location: [place];  Service: Orthopedics;  Laterality: Left;  Left Total Knee Arthroplasty, Cemented, Computer Assist   TONSILLECTOMY     TOTAL KNEE REVISION Left 09/19/2014   Procedure: LEFT TOTAL KNEE REVISION;  Surgeon: Leandrew Koyanagi, MD;  Location: [place];  Service: Orthopedics;  Laterality: Left;   UPPER GASTROINTESTINAL ENDOSCOPY     VIDEO BRONCHOSCOPY N/A 09/12/2019   Procedure: VIDEO BRONCHOSCOPY WITH FLUORO;  Surgeon: Laurin Coder, MD;  Location: [facility];  Service: Endoscopy;  Laterality: N/A;   Family History  Problem Relation Age of Onset   Aortic dissection Father    Arthritis Mother    Stroke Mother 78   Heart murmur Other    Throat cancer Sister    Colon cancer Neg Hx    Esophageal cancer Neg Hx    Stomach cancer Neg Hx    Rectal cancer Neg Hx  Colon polyps Neg Hx    Social History   Socioeconomic History   Marital status: Married    Spouse name: Not on file   Number of children: 1   Years of education: Not on file   Highest education level: Not on file  Occupational History   Occupation: Retired  Tobacco Use   Smoking status: Former    Packs/day: 0.25    Years: 20.00    Pack years: 5.00    Types: Cigarettes    Quit date: 09/08/1978    Years since quitting: 42.4   Smokeless tobacco: Never   Tobacco comments:    quit smoking 89yrs ago  Vaping Use   Vaping Use: Never used  Substance and Sexual Activity   Alcohol use: No   Drug use: No   Sexual activity: Never    Birth control/protection: Post-menopausal  Other Topics Concern   Not on file  Social History Narrative   Right handed    Lives with husband    Social Determinants of Health   Financial Resource Strain: Low Risk    Difficulty of Paying Living Expenses: Not hard at all  Food Insecurity: No Food Insecurity   Worried About Charity fundraiser in the Last Year: Never true   [place] in the Last Year: Never true  Transportation Needs:  No Transportation Needs   Lack of Transportation (Medical): No   Lack of Transportation (Non-Medical): No  Physical Activity: Inactive   Days of Exercise per Week: 0 days   Minutes of Exercise per Session: 0 min  Stress: No Stress Concern Present   Feeling of Stress : Not at all  Social Connections: Moderately Isolated   Frequency of Communication with Friends and Family: More than three times a week   Frequency of Social Gatherings with Friends and Family: Twice a week   Attends Religious Services: Never   Marine scientist or Organizations: No   Attends Music therapist: Never   Marital Status: Married    Tobacco Counseling Counseling given: Not Answered Tobacco comments: quit smoking 77yrs ago   Clinical Intake:  Pre-visit preparation completed: Yes  Pain : No/denies pain     BMI - recorded: 38.31 Nutritional Status: BMI > 30  Obese Nutritional Risks: None Diabetes: No  How often do you need to have someone help you when you read instructions, pamphlets, or other written materials from your doctor or pharmacy?: 1 - Never  Diabetic?no  Interpreter Needed?: No  Information entered by :: Charlott Rakes, LPN   Activities of Daily Living In your present state of health, do you have any difficulty performing the following activities: 01/22/2021 10/16/2020  Hearing? N N  Vision? N N  Difficulty concentrating or making decisions? N N  Walking or climbing stairs? Y N  Dressing or bathing? N N  Doing errands, shopping? N N  Preparing Food and eating ? N -  Using the Toilet? N -  In the past six months, have you accidently leaked urine? Y -  Do you have problems with loss of bowel control? N -  Managing your Medications? N -  Managing your Finances? N -  Housekeeping or managing your Housekeeping? N -  Some recent data might be hidden    Patient Care Team: Caren Macadam, MD as PCP - General (Family Medicine) Minus Breeding, MD as  Consulting Physician (Cardiology) Azucena Fallen, MD as Consulting Physician (Obstetrics and Gynecology) Kirtland Bouchard, PhD (  Inactive) as Consulting Physician (Psychology) Donnal Moat (Psychiatry) Armbruster, Carlota Raspberry, MD as Consulting Physician (Gastroenterology) Kerin Perna., MD as Referring Physician (Neurology) Cameron Sprang, MD as Consulting Physician (Neurology)  Indicate any recent Medical Services you may have received from other than [facility] providers in the past year (date may be approximate).     Assessment:   This is a routine wellness examination for Naveena.  Hearing/Vision screen Hearing Screening - Comments:: Pt denies any hearing issues  Vision Screening - Comments:: Pt follows up with Dr Jodene Nam for annual eye exams   Dietary issues and exercise activities discussed: Current Exercise Habits: The patient does not participate in regular exercise at present   Goals Addressed             This Visit's Progress    Patient Stated       Get better with breathing to do more        Depression Screen PHQ 2/9 Scores 01/22/2021 09/19/2020 01/17/2020 07/08/2019 06/17/2018 03/15/2018 11/12/2017  PHQ - 2 Score 1 0 2 0 0 0 0  PHQ- 9 Score - 9 3 4 3 3  -    Fall Risk Fall Risk  01/22/2021 11/07/2020 07/25/2020 02/14/2020 01/17/2020  Falls in the past year? 1 1 1 1 1   Number falls in past yr: 1 1 1 1 1   Injury with Fall? 1 1 1 1 1   Comment - - - - bruised both knees  Risk for fall due to : Impaired balance/gait;Impaired mobility;Impaired vision - - - History of fall(s);Impaired balance/gait;Impaired vision  Follow up Falls prevention discussed - - - Falls prevention discussed    FALL RISK PREVENTION PERTAINING TO THE HOME:  Any stairs in or around the home? Yes  If so, are there any without handrails? No  Home free of loose throw rugs in walkways, pet beds, electrical cords, etc? Yes  Adequate lighting in your home to reduce risk of falls? Yes   ASSISTIVE DEVICES  UTILIZED TO PREVENT FALLS:  Life alert? No  Use of a cane, walker or w/c? Yes  Grab bars in the bathroom? Yes  Shower chair or bench in shower? Yes  Elevated toilet seat or a handicapped toilet? Yes   TIMED UP AND GO:  Was the test performed? No .   Cognitive Function: MMSE - Mini Mental State Exam 11/12/2017 11/12/2017  Not completed: (No Data) (No Data)     6CIT Screen 01/22/2021 01/17/2020  What Year? 0 points 0 points  What month? 0 points 0 points  What time? 0 points -  Count back from 20 0 points 0 points  Months in reverse 0 points 2 points  Repeat phrase 0 points 0 points  Total Score 0 -    Immunizations Immunization History  Administered Date(s) Administered   Fluad Quad(high Dose 65+) 01/27/2019, 01/11/2020   Influenza Whole 01/19/2009, 12/19/2009   Influenza, High Dose Seasonal PF 02/07/2014, 01/08/2015, 02/20/2015, 01/03/2016, 02/20/2016, 01/01/2017, 01/20/2017, 01/28/2018   Influenza,inj,Quad PF,6+ Mos 01/24/2014   Influenza-Unspecified 01/06/2012   PFIZER(Purple Top)SARS-COV-2 Vaccination 07/02/2019, 07/23/2019, 01/30/2020, 09/16/2020   Pneumococcal Conjugate-13 09/19/2009   Pneumococcal Polysaccharide-23 06/20/2013, 02/20/2015, 02/20/2016, 01/20/2017, 02/03/2018, 01/21/2021   Tdap 05/30/2014   Varicella 09/23/2010    TDAP status: Up to date  Flu Vaccine status: Due, Education has been provided regarding the importance of this vaccine. Advised may receive this vaccine at local pharmacy or Health Dept. Aware to provide a copy of the vaccination record if obtained from local  pharmacy or Health Dept. Verbalized acceptance and understanding.  Pneumococcal vaccine status: Up to date  Covid-19 vaccine status: Completed vaccines  Qualifies for Shingles Vaccine? Yes   Zostavax completed No   Shingrix Completed?: No.    Education has been provided regarding the importance of this vaccine. Patient has been advised to call insurance company to determine out of  pocket expense if they have not yet received this vaccine. Advised may also receive vaccine at local pharmacy or Health Dept. Verbalized acceptance and understanding.  Screening Tests Health Maintenance  Topic Date Due   Zoster Vaccines- Shingrix (1 of 2) Never done   INFLUENZA VACCINE  07/19/2021 (Originally 11/19/2020)   MAMMOGRAM  07/11/2022   TETANUS/TDAP  05/30/2024   COLONOSCOPY (Pts 45-49yrs Insurance coverage will need to be confirmed)  12/04/2030   DEXA SCAN  Completed   COVID-19 Vaccine  Completed   Hepatitis C Screening  Completed   HPV VACCINES  Aged Out    Health Maintenance  Health Maintenance Due  Topic Date Due   Zoster Vaccines- Shingrix (1 of 2) Never done    Colorectal cancer screening: Type of screening: Colonoscopy. Completed 12/03/20. Repeat every 10 years  Mammogram status: Completed 07/10/20. Repeat every year  Bone Density status: Completed 01/10/21. Results reflect: Bone density results: OSTEOPOROSIS. Repeat every 2 years.    Additional Screening:  Hepatitis C Screening: Completed 11/01/15  Vision Screening: Recommended annual ophthalmology exams for early detection of glaucoma and other disorders of the eye. Is the patient up to date with their annual eye exam?  Yes  Who is the provider or what is the name of the office in which the patient attends annual eye exams? Dr Jodene Nam  If pt is not established with a provider, would they like to be referred to a provider to establish care? No .   Dental Screening: Recommended annual dental exams for proper oral hygiene  Community Resource Referral / Chronic Care Management: CRR required this visit?  No   CCM required this visit?  No      Plan:     I have personally reviewed and noted the following in the patient's chart:   Medical and social history Use of alcohol, tobacco or illicit drugs  Current medications and supplements including opioid prescriptions.  Functional ability and  status Nutritional status Physical activity Advanced directives List of other physicians Hospitalizations, surgeries, and ER visits in previous 12 months Vitals Screenings to include cognitive, depression, and falls Referrals and appointments  In addition, I have reviewed and discussed with patient certain preventive protocols, quality metrics, and best practice recommendations. A written personalized care plan for preventive services as well as general preventive health recommendations were provided to patient.     Willette Brace, LPN   01/0/2725   Nurse Notes: none

## 2021-01-22 NOTE — Patient Instructions (Addendum)
Beverly Ballard , Thank you for taking time to come for your Medicare Wellness Visit. I appreciate your ongoing commitment to your health goals. Please review the following plan we discussed and let me know if I can assist you in the future.   Screening recommendations/referrals: Colonoscopy: Done 12/03/20 repeat every 10 years  Mammogram: Done 07/10/20 repeat every year  Bone Density: Done 01/10/21 repeat every 2 years Recommended yearly ophthalmology/optometry visit for glaucoma screening and checkup Recommended yearly dental visit for hygiene and checkup  Vaccinations: Influenza vaccine: Due and discussed Pneumococcal vaccine: Completed  Tdap vaccine: Done 05/30/14 repeat every 10 years  Shingles vaccine: Shingrix discussed. Please contact your pharmacy for coverage information.    Covid-19:Completed 3/13, 4/3, 01/30/20 & 09/16/20  Advanced directives: Please bring a copy of your health care power of attorney and living will to the office at your convenience.  Conditions/risks identified: Get better with breathing to do more  Next appointment: Follow up in one year for your annual wellness visit    Preventive Care 65 Years and Older, Female Preventive care refers to lifestyle choices and visits with your health care provider that can promote health and wellness. What does preventive care include? A yearly physical exam. This is also called an annual well check. Dental exams once or twice a year. Routine eye exams. Ask your health care provider how often you should have your eyes checked. Personal lifestyle choices, including: Daily care of your teeth and gums. Regular physical activity. Eating a healthy diet. Avoiding tobacco and drug use. Limiting alcohol use. Practicing safe sex. Taking low-dose aspirin every day. Taking vitamin and mineral supplements as recommended by your health care provider. What happens during an annual well check? The services and screenings done by your health  care provider during your annual well check will depend on your age, overall health, lifestyle risk factors, and family history of disease. Counseling  Your health care provider may ask you questions about your: Alcohol use. Tobacco use. Drug use. Emotional well-being. Home and relationship well-being. Sexual activity. Eating habits. History of falls. Memory and ability to understand (cognition). Work and work Statistician. Reproductive health. Screening  You may have the following tests or measurements: Height, weight, and BMI. Blood pressure. Lipid and cholesterol levels. These may be checked every 5 years, or more frequently if you are over 34 years old. Skin check. Lung cancer screening. You may have this screening every year starting at age 72 if you have a 30-pack-year history of smoking and currently smoke or have quit within the past 15 years. Fecal occult blood test (FOBT) of the stool. You may have this test every year starting at age 39. Flexible sigmoidoscopy or colonoscopy. You may have a sigmoidoscopy every 5 years or a colonoscopy every 10 years starting at age 69. Hepatitis C blood test. Hepatitis B blood test. Sexually transmitted disease (STD) testing. Diabetes screening. This is done by checking your blood sugar (glucose) after you have not eaten for a while (fasting). You may have this done every 1-3 years. Bone density scan. This is done to screen for osteoporosis. You may have this done starting at age 66. Mammogram. This may be done every 1-2 years. Talk to your health care provider about how often you should have regular mammograms. Talk with your health care provider about your test results, treatment options, and if necessary, the need for more tests. Vaccines  Your health care provider may recommend certain vaccines, such as: Influenza vaccine. This is  recommended every year. Tetanus, diphtheria, and acellular pertussis (Tdap, Td) vaccine. You may need a Td  booster every 10 years. Zoster vaccine. You may need this after age 38. Pneumococcal 13-valent conjugate (PCV13) vaccine. One dose is recommended after age 74. Pneumococcal polysaccharide (PPSV23) vaccine. One dose is recommended after age 39. Talk to your health care provider about which screenings and vaccines you need and how often you need them. This information is not intended to replace advice given to you by your health care provider. Make sure you discuss any questions you have with your health care provider. Document Released: 05/04/2015 Document Revised: 12/26/2015 Document Reviewed: 02/06/2015 Elsevier Interactive Patient Education  2017 [place] Prevention in the Home Falls can cause injuries. They can happen to people of all ages. There are many things you can do to make your home safe and to help prevent falls. What can I do on the outside of my home? Regularly fix the edges of walkways and driveways and fix any cracks. Remove anything that might make you trip as you walk through a door, such as a raised step or threshold. Trim any bushes or trees on the path to your home. Use bright outdoor lighting. Clear any walking paths of anything that might make someone trip, such as rocks or tools. Regularly check to see if handrails are loose or broken. Make sure that both sides of any steps have handrails. Any raised decks and porches should have guardrails on the edges. Have any leaves, snow, or ice cleared regularly. Use sand or salt on walking paths during winter. Clean up any spills in your garage right away. This includes oil or grease spills. What can I do in the bathroom? Use night lights. Install grab bars by the toilet and in the tub and shower. Do not use towel bars as grab bars. Use non-skid mats or decals in the tub or shower. If you need to sit down in the shower, use a plastic, non-slip stool. Keep the floor dry. Clean up any water that spills on the floor  as soon as it happens. Remove soap buildup in the tub or shower regularly. Attach bath mats securely with double-sided non-slip rug tape. Do not have throw rugs and other things on the floor that can make you trip. What can I do in the bedroom? Use night lights. Make sure that you have a light by your bed that is easy to reach. Do not use any sheets or blankets that are too big for your bed. They should not hang down onto the floor. Have a firm chair that has side arms. You can use this for support while you get dressed. Do not have throw rugs and other things on the floor that can make you trip. What can I do in the kitchen? Clean up any spills right away. Avoid walking on wet floors. Keep items that you use a lot in easy-to-reach places. If you need to reach something above you, use a strong step stool that has a grab bar. Keep electrical cords out of the way. Do not use floor polish or wax that makes floors slippery. If you must use wax, use non-skid floor wax. Do not have throw rugs and other things on the floor that can make you trip. What can I do with my stairs? Do not leave any items on the stairs. Make sure that there are handrails on both sides of the stairs and use them. Fix handrails that are  broken or loose. Make sure that handrails are as long as the stairways. Check any carpeting to make sure that it is firmly attached to the stairs. Fix any carpet that is loose or worn. Avoid having throw rugs at the top or bottom of the stairs. If you do have throw rugs, attach them to the floor with carpet tape. Make sure that you have a light switch at the top of the stairs and the bottom of the stairs. If you do not have them, ask someone to add them for you. What else can I do to help prevent falls? Wear shoes that: Do not have high heels. Have rubber bottoms. Are comfortable and fit you well. Are closed at the toe. Do not wear sandals. If you use a stepladder: Make sure that it is  fully opened. Do not climb a closed stepladder. Make sure that both sides of the stepladder are locked into place. Ask someone to hold it for you, if possible. Clearly mark and make sure that you can see: Any grab bars or handrails. First and last steps. Where the edge of each step is. Use tools that help you move around (mobility aids) if they are needed. These include: Canes. Walkers. Scooters. Crutches. Turn on the lights when you go into a dark area. Replace any light bulbs as soon as they burn out. Set up your furniture so you have a clear path. Avoid moving your furniture around. If any of your floors are uneven, fix them. If there are any pets around you, be aware of where they are. Review your medicines with your doctor. Some medicines can make you feel dizzy. This can increase your chance of falling. Ask your doctor what other things that you can do to help prevent falls. This information is not intended to replace advice given to you by your health care provider. Make sure you discuss any questions you have with your health care provider. Document Released: 02/01/2009 Document Revised: 09/13/2015 Document Reviewed: 05/12/2014 Elsevier Interactive Patient Education  2017 [employer].

## 2021-01-23 ENCOUNTER — Encounter: Payer: Self-pay | Admitting: Physician Assistant

## 2021-01-23 ENCOUNTER — Ambulatory Visit (INDEPENDENT_AMBULATORY_CARE_PROVIDER_SITE_OTHER): Payer: Medicare HMO | Admitting: Physician Assistant

## 2021-01-23 DIAGNOSIS — F3341 Major depressive disorder, recurrent, in partial remission: Secondary | ICD-10-CM | POA: Diagnosis not present

## 2021-01-23 DIAGNOSIS — F411 Generalized anxiety disorder: Secondary | ICD-10-CM

## 2021-01-23 DIAGNOSIS — R69 Illness, unspecified: Secondary | ICD-10-CM | POA: Diagnosis not present

## 2021-01-23 MED ORDER — BUSPIRONE HCL 10 MG PO TABS: 10.0000 mg | ORAL_TABLET | Freq: Two times a day (BID) | ORAL | 1 refills | Status: DC

## 2021-01-23 NOTE — Progress Notes (Signed)
[facility]  Patient ID: Beverly Ballard,  MRN: [id number]  PCP: Caren Macadam, MD  Date of Evaluation: 01/23/2021 Time spent:30 minutes  Chief Complaint:  Chief Complaint   Anxiety; Depression; Follow-up    Virtual Visit via Telehealth  I connected with patient by telephone, with their informed consent, and verified patient privacy and that I am speaking with the correct person using two identifiers.  I am private, in my office and the patient is at home.  I discussed the limitations, risks, security and privacy concerns of performing an evaluation and management service by telephone  and the availability of in person appointments. I also discussed with the patient that there may be a patient responsible charge related to this service. The patient expressed understanding and agreed to proceed.   I discussed the assessment and treatment plan with the patient. The patient was provided an opportunity to ask questions and all were answered. The patient agreed with the plan and demonstrated an understanding of the instructions.   The patient was advised to call back or seek an in-person evaluation if the symptoms worsen or if the condition fails to improve as anticipated.  I provided 30 minutes of non-face-to-face time during this encounter.  HISTORY/CURRENT STATUS: HPI  For 76-month med check.  Added BuSpar at the last visit for anxiety and chronic abdominal pain with no identified calls.  At first she tells me she is not sure it has helped but later in the conversation she says it does help some and her stomach does not hurt all the time like it used to.  It is not as severe when she has pain either.  Not having panic attacks.  Still has some generalized sense of unease at times but not as bad as it was prior to starting BuSpar.  She misunderstood the Ativan and has not been taking it, she thought she was not supposed to once we started the BuSpar.  That was clarified with  her.  Patient denies loss of interest in usual activities and is able to enjoy things.  Denies decreased energy or motivation.  Appetite has not changed.  No extreme sadness, tearfulness, or feelings of hopelessness.  Denies any changes in concentration, making decisions or remembering things.  Sleeps well most of the time.  Denies suicidal or homicidal thoughts.  Denies syncope, seizures, numbness, tingling, tremor, tics, unsteady gait, slurred speech, confusion.  She woke up with dizziness this morning and that is why she requested a telehealth visit today.  Does not usually have dizziness though.  No other symptoms reported and she will check with her PCP if symptoms do not improve or worsen in any way.  Continues to have joint pain especially low extremities due to arthritis.  Individual Medical History/ Review of Systems: Changes? :Yes  See HPI  Past medications for mental health diagnoses include: Paxil, Prozac, Valium  Allergies: Lamotrigine, Bupropion, Carbamazepine, Levetiracetam, Tramadol, Adhesive [tape], Amitriptyline, Clarithromycin, Doxycycline, Nickel, Other, Topamax [topiramate], Estradiol, Latex, and Phenytoin sodium extended  Current Medications:  Current Outpatient Medications:    albuterol (VENTOLIN HFA) 108 (90 Base) MCG/ACT inhaler, Inhale 1-2 puffs into the lungs every 6 (six) hours as needed for wheezing or shortness of breath., Disp: 8 g, Rfl: 0   b complex vitamins tablet, Take 1 tablet by mouth daily., Disp: , Rfl:    beta carotene w/minerals (OCUVITE) tablet, Take 1 tablet by mouth daily., Disp: , Rfl:    Budeson-Glycopyrrol-Formoterol (BREZTRI AEROSPHERE) 160-9-4.8 MCG/ACT  AERO, Inhale 2 puffs into the lungs 2 (two) times daily., Disp: 5.9 g, Rfl: 0   busPIRone (BUSPAR) 10 MG tablet, Take 1 tablet (10 mg total) by mouth 2 (two) times daily., Disp: 60 tablet, Rfl: 1   Cholecalciferol (VITAMIN D3 PO), Take by mouth., Disp: , Rfl:    cyclobenzaprine (FLEXERIL) 10 MG  tablet, Take 1 tablet (10 mg total) by mouth 2 (two) times daily as needed for muscle spasms., Disp: 30 tablet, Rfl: 0   EPINEPHrine 0.3 mg/0.3 mL IJ SOAJ injection, SMARTSIG:Injection As Directed, Disp: , Rfl:    esomeprazole (NEXIUM) 20 MG capsule, Take 2 capsules (40 mg total) by mouth daily before breakfast. (Patient taking differently: Take 20 mg by mouth daily before breakfast.), Disp: 2 capsule, Rfl: 0   furosemide (LASIX) 20 MG tablet, TAKE 1 TABLET BY MOUTH IN THE MORNING, Disp: 30 tablet, Rfl: 1   hyoscyamine (LEVSIN SL) 0.125 MG SL tablet, Place 1-2 tablets (0.125-0.25 mg total) under the tongue every 6 (six) hours as needed., Disp: 30 tablet, Rfl: 1   levocetirizine (XYZAL) 5 MG tablet, Take 5 mg by mouth daily. , Disp: , Rfl:    levothyroxine (SYNTHROID) 88 MCG tablet, Take 1 tablet (88 mcg total) by mouth daily., Disp: 90 tablet, Rfl: 3   lisinopril (ZESTRIL) 5 MG tablet, TAKE 1 TABLET BY MOUTH EVERY DAY, Disp: 90 tablet, Rfl: 1   LORazepam (ATIVAN) 1 MG tablet, Take 1 tablet (1 mg total) by mouth every 6 (six) hours as needed for anxiety., Disp: 60 tablet, Rfl: 1   ondansetron (ZOFRAN ODT) 4 MG disintegrating tablet, Take 1 tablet (4 mg total) by mouth every 6 (six) hours as needed for nausea or vomiting., Disp: 30 tablet, Rfl: 3   Peppermint Oil (IBGARD) 90 MG CPCR, Take as directed, Disp: 8 capsule, Rfl: 0   pravastatin (PRAVACHOL) 20 MG tablet, TAKE 1 TABLET (20 MG TOTAL) BY MOUTH DAILY., Disp: 90 tablet, Rfl: 1   sertraline (ZOLOFT) 100 MG tablet, Take 2 tablets (200 mg total) by mouth daily., Disp: 180 tablet, Rfl: 1   Spacer/Aero-Holding Chambers (AEROCHAMBER PLUS) inhaler, Use as instructed, Disp: 1 each, Rfl: 0   zonisamide (ZONEGRAN) 100 MG capsule, Take 2 capsules twice a day, Disp: 360 capsule, Rfl: 3 Medication Side Effects: none  Family Medical/ Social History: Changes? No  MENTAL HEALTH EXAM:  There were no vitals taken for this visit.There is no height or weight on  file to calculate BMI.  General Appearance:  unable to assess  Eye Contact:   Unable to assess  Speech:  Clear and Coherent and Normal Rate  Volume:  Normal  Mood:  Euthymic  Affect:   unable to assess  Thought Process:  Goal Directed and Descriptions of Associations: Intact  Orientation:  Full (Time, Place, and Person)  Thought Content: Logical   Suicidal Thoughts:  No  Homicidal Thoughts:  No  Memory:  Immediate;   Fair Recent;   Fair  Judgement:  Good  Insight:  Good  Psychomotor Activity:   Unable to assess  Concentration:  Concentration: Good  Recall:  Good  Fund of Knowledge: Good  Language: Good  Assets:  Desire for Improvement  ADL's:  Intact  Cognition: WNL  Prognosis:  Good    DIAGNOSES:    ICD-10-CM   1. Generalized anxiety disorder  F41.1     2. Recurrent major depressive disorder, in partial remission ([place])  F33.41         Receiving  Psychotherapy: No    RECOMMENDATIONS:  PDMP was reviewed. Last Ativan 12/07/2020. I provided 30 minutes of non-face-to-face time during this encounter, including time spent before and after the visit in records review, medical decision making, counseling concerning today's visit, and charting.  Since Buspar is causing drowsiness, decrease dose to 10 mg.  Decrease Buspar to 10 mg bid.  Continue Ativan 1 mg, 1 p.o. 4 times daily as needed anxiety. Continue Zoloft 100 mg, 2 p.o. daily. Continue multivitamin, B complex, vitamin D, and Fish oil. Return in 2 months.  Donnal Moat, PA-C

## 2021-01-27 DIAGNOSIS — G4733 Obstructive sleep apnea (adult) (pediatric): Secondary | ICD-10-CM | POA: Diagnosis not present

## 2021-02-08 DIAGNOSIS — J3089 Other allergic rhinitis: Secondary | ICD-10-CM | POA: Diagnosis not present

## 2021-02-08 DIAGNOSIS — J301 Allergic rhinitis due to pollen: Secondary | ICD-10-CM | POA: Diagnosis not present

## 2021-02-08 DIAGNOSIS — J3081 Allergic rhinitis due to animal (cat) (dog) hair and dander: Secondary | ICD-10-CM | POA: Diagnosis not present

## 2021-02-11 ENCOUNTER — Other Ambulatory Visit: Payer: Self-pay

## 2021-02-11 ENCOUNTER — Ambulatory Visit (INDEPENDENT_AMBULATORY_CARE_PROVIDER_SITE_OTHER): Payer: Medicare HMO | Admitting: Family Medicine

## 2021-02-11 ENCOUNTER — Encounter: Payer: Self-pay | Admitting: Family Medicine

## 2021-02-11 VITALS — BP 140/68 | HR 70 | Temp 98.1°F | Ht 65.0 in | Wt 229.5 lb

## 2021-02-11 DIAGNOSIS — Z23 Encounter for immunization: Secondary | ICD-10-CM

## 2021-02-11 DIAGNOSIS — F321 Major depressive disorder, single episode, moderate: Secondary | ICD-10-CM

## 2021-02-11 DIAGNOSIS — R42 Dizziness and giddiness: Secondary | ICD-10-CM

## 2021-02-11 DIAGNOSIS — I10 Essential (primary) hypertension: Secondary | ICD-10-CM | POA: Diagnosis not present

## 2021-02-11 DIAGNOSIS — R69 Illness, unspecified: Secondary | ICD-10-CM | POA: Diagnosis not present

## 2021-02-11 NOTE — Progress Notes (Signed)
Beverly Ballard DOB: 1945-11-11 Encounter date: 02/11/2021  This is a 75 y.o. female who presents with Chief Complaint  Patient presents with   Insomnia   Hypertension    Patient complains of elevated systolic BP readings of 976B and states diastolic readings were normal    Dizziness    X2-3 months     History of present illness: Has been noting systolics for 1-2 weeks of over 160. Hadn't worried about it, but then feeling very dizzy and husband suggested checking bp. Checks different times of day and staying high. Usually getting between 34-19 for diastolic.   Has been feeling really sleepy. Noted when that started she would just sit down and be asleep.   Feels really depressed. Not sure how to pull self out. Scared to go out, doesn't want to go out. Feels safe in house and where she wants to be. Doesn't want to be out in world with everything going on. Feels depression worse in last 6 months; just going deeper and deeper. Not doing crafts. Doesn't feel like she is well supported by family. Doesn't really enjoy doing anything.   Allergies  Allergen Reactions   Lamotrigine Swelling    Tongue swelling   Bupropion Other (See Comments)    not known   Carbamazepine Other (See Comments)    Hypnatremia   Levetiracetam     Other reaction(s): Dizziness (intolerance)   Tramadol Other (See Comments)    not known   Adhesive [Tape]     ?  Blister after knee surgery   Amitriptyline     nausea   Clarithromycin     Unsure of reaction     Doxycycline     Interfered with seizure medication   Nickel     Had to remove knee implant with nickel and replace it   Other     Metal and perfumes   Topamax [Topiramate] Nausea And Vomiting   Estradiol Rash   Latex Rash   Phenytoin Sodium Extended Other (See Comments)    drowsiness   Current Meds  Medication Sig   albuterol (VENTOLIN HFA) 108 (90 Base) MCG/ACT inhaler Inhale 1-2 puffs into the lungs every 6 (six) hours as needed for  wheezing or shortness of breath.   b complex vitamins tablet Take 1 tablet by mouth daily.   beta carotene w/minerals (OCUVITE) tablet Take 1 tablet by mouth daily.   Budeson-Glycopyrrol-Formoterol (BREZTRI AEROSPHERE) 160-9-4.8 MCG/ACT AERO Inhale 2 puffs into the lungs 2 (two) times daily.   busPIRone (BUSPAR) 10 MG tablet Take 1 tablet (10 mg total) by mouth 2 (two) times daily.   Cholecalciferol (VITAMIN D3 PO) Take by mouth.   cyclobenzaprine (FLEXERIL) 10 MG tablet Take 1 tablet (10 mg total) by mouth 2 (two) times daily as needed for muscle spasms.   EPINEPHrine 0.3 mg/0.3 mL IJ SOAJ injection SMARTSIG:Injection As Directed   esomeprazole (NEXIUM) 20 MG capsule Take 2 capsules (40 mg total) by mouth daily before breakfast. (Patient taking differently: Take 20 mg by mouth daily before breakfast.)   furosemide (LASIX) 20 MG tablet TAKE 1 TABLET BY MOUTH IN THE MORNING   hyoscyamine (LEVSIN SL) 0.125 MG SL tablet Place 1-2 tablets (0.125-0.25 mg total) under the tongue every 6 (six) hours as needed.   levocetirizine (XYZAL) 5 MG tablet Take 5 mg by mouth daily.    levothyroxine (SYNTHROID) 88 MCG tablet Take 1 tablet (88 mcg total) by mouth daily.   lisinopril (ZESTRIL) 5 MG tablet TAKE 1  TABLET BY MOUTH EVERY DAY   LORazepam (ATIVAN) 1 MG tablet Take 1 tablet (1 mg total) by mouth every 6 (six) hours as needed for anxiety.   ondansetron (ZOFRAN ODT) 4 MG disintegrating tablet Take 1 tablet (4 mg total) by mouth every 6 (six) hours as needed for nausea or vomiting.   Peppermint Oil (IBGARD) 90 MG CPCR Take as directed   pravastatin (PRAVACHOL) 20 MG tablet TAKE 1 TABLET (20 MG TOTAL) BY MOUTH DAILY.   sertraline (ZOLOFT) 100 MG tablet Take 2 tablets (200 mg total) by mouth daily.   Spacer/Aero-Holding Chambers (AEROCHAMBER PLUS) inhaler Use as instructed   zonisamide (ZONEGRAN) 100 MG capsule Take 2 capsules twice a day    Review of Systems  Constitutional:  Positive for fatigue. Negative  for chills and fever.  Respiratory:  Negative for cough, chest tightness, shortness of breath and wheezing.   Cardiovascular:  Negative for chest pain, palpitations and leg swelling.  Neurological:  Positive for dizziness (intermittent; has had dizziness on and off for years).  Psychiatric/Behavioral:  Negative for suicidal ideas. The patient is nervous/anxious.    Objective:  BP 110/60 (BP Location: Left Arm, Patient Position: Sitting, Cuff Size: Large)   Pulse 70   Temp 98.1 F (36.7 C) (Oral)   Ht 5\' 5"  (1.651 m)   Wt 229 lb 8 oz (104.1 kg)   SpO2 93%   BMI 38.19 kg/m   Weight: 229 lb 8 oz (104.1 kg)   BP Readings from Last 3 Encounters:  02/11/21 110/60  01/08/21 (!) 98/50  12/17/20 130/75   Wt Readings from Last 3 Encounters:  02/11/21 229 lb 8 oz (104.1 kg)  01/08/21 230 lb 3.2 oz (104.4 kg)  12/03/20 229 lb (103.9 kg)    Physical Exam Constitutional:      General: She is not in acute distress.    Appearance: She is well-developed.  Cardiovascular:     Rate and Rhythm: Normal rate and regular rhythm.     Heart sounds: Normal heart sounds. No murmur heard.   No friction rub.  Pulmonary:     Effort: Pulmonary effort is normal. No respiratory distress.     Breath sounds: Normal breath sounds. No wheezing or rales.  Musculoskeletal:     Right lower leg: No edema.     Left lower leg: No edema.  Neurological:     Mental Status: She is alert and oriented to person, place, and time.  Psychiatric:        Attention and Perception: Attention normal.        Mood and Affect: Mood is depressed. Affect is tearful.        Speech: Speech normal.        Behavior: Behavior normal.        Cognition and Memory: Cognition normal.     Comments: She is tearful today. She feels very depressed. She does not feel that she has good support system. She doesn't feel that family members really understand her depression and what she is going through. She does not feel motivated to leave the  house. 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 She worries she is over medicated and would like to come off of medications, but overall just wants to feel better.     Assessment/Plan  1. Need for immunization against influenza -  Flu Vaccine QUAD High Dose(Fluad)  2. Essential hypertension Blood pressure today in office was initially quite low, somewhat higher on my recheck, but again low for orthostatics. Lying down made her a little dizzy, so pressure was not checked in that position. I would like for her to continue to monitor at home. We discussed that we don't want blood pressure to be too low as this would worsen sx; I wonder if her cuff may be over-reading her systolic pressure and have asked her to record pressures at home and to bring home cuff to her next visit (in a couple of weeks) so we can make sure it is properly reading.   3. Depression, major, single episode, moderate ([place]) Depression is not well controlled. 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 I'm not sure if she is practicing in different location or doing online work; so will look into this. I do think that depression would feel better controlled with regular therapy as she felt that she was truly listened to and had outlet for some of emotions when she was in regular therapy with allison in the past.   4. Dizziness She has been associating this with higher pressures, but pressures in office today are normal. Not orthostatic. Have asked her to continue to monitor and record. Let me know if pressures are regularly high at home - in which case we can have her come in sooner for check of her cuff against office cuff. Recent bloodwork reviewed and was normal. She would like to work on decreasing medications if  possible.   Would consider genesight testing for her in case of desire for med change.   Return for has appointment set up for follow up.  45 minutes spent in chart review, time with patient, exam, charting.    Micheline Rough, MD

## 2021-02-12 ENCOUNTER — Telehealth: Payer: Self-pay | Admitting: Family Medicine

## 2021-02-12 ENCOUNTER — Telehealth: Payer: Self-pay | Admitting: Physician Assistant

## 2021-02-12 NOTE — Telephone Encounter (Signed)
Dr. Ethlyn Gallery sent me a note about [place] and I would like to see Alaya sooner than our appointment that I believe is scheduled in early December.  See if she can come in sometime in the next month, okay to have her in one of my "urgent appointment" spots.  Thank you.

## 2021-02-12 NOTE — Chronic Care Management (AMB) (Signed)
  Chronic Care Management   Note  02/12/2021 Name: Beverly Ballard MRN: [id number] DOB: 1945/09/27  Beverly Ballard is a 75 y.o. year old female who is a primary care patient of Koberlein, Steele Berg, MD. I reached out to Beverly Ballard by phone today in response to a referral sent by Ms. Alphonse Guild Greenley's PCP, Caren Macadam, MD.   [name] was given information about Chronic Care Management services today including:  CCM service includes personalized support from designated clinical staff supervised by her physician, including individualized plan of care and coordination with other care providers 24/7 contact phone numbers for assistance for urgent and routine care needs. Service will only be billed when office clinical staff spend 20 minutes or more in a month to coordinate care. Only one practitioner may furnish and bill the service in a calendar month. The patient may stop CCM services at any time (effective at the end of the month) by phone call to the office staff.   Patient agreed to services and verbal consent obtained.   Follow up plan:   Tatjana Secretary/administrator

## 2021-02-12 NOTE — Telephone Encounter (Signed)
She is coming in tomorrow at 2:30 pm

## 2021-02-12 NOTE — Telephone Encounter (Signed)
noted 

## 2021-02-13 ENCOUNTER — Ambulatory Visit (INDEPENDENT_AMBULATORY_CARE_PROVIDER_SITE_OTHER): Payer: Medicare HMO | Admitting: Physician Assistant

## 2021-02-13 ENCOUNTER — Ambulatory Visit: Payer: Medicare HMO | Admitting: Neurology

## 2021-02-13 ENCOUNTER — Other Ambulatory Visit: Payer: Self-pay

## 2021-02-13 ENCOUNTER — Encounter: Payer: Self-pay | Admitting: Physician Assistant

## 2021-02-13 DIAGNOSIS — F332 Major depressive disorder, recurrent severe without psychotic features: Secondary | ICD-10-CM | POA: Diagnosis not present

## 2021-02-13 DIAGNOSIS — Z630 Problems in relationship with spouse or partner: Secondary | ICD-10-CM | POA: Diagnosis not present

## 2021-02-13 DIAGNOSIS — G4700 Insomnia, unspecified: Secondary | ICD-10-CM

## 2021-02-13 DIAGNOSIS — F411 Generalized anxiety disorder: Secondary | ICD-10-CM

## 2021-02-13 DIAGNOSIS — R69 Illness, unspecified: Secondary | ICD-10-CM | POA: Diagnosis not present

## 2021-02-13 DIAGNOSIS — G4733 Obstructive sleep apnea (adult) (pediatric): Secondary | ICD-10-CM | POA: Diagnosis not present
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
## 2021-02-15 DIAGNOSIS — J3089 Other allergic rhinitis: Secondary | ICD-10-CM | POA: Diagnosis not present

## 2021-02-15 DIAGNOSIS — J3081 Allergic rhinitis due to animal (cat) (dog) hair and dander: Secondary | ICD-10-CM | POA: Diagnosis not present

## 2021-02-15 DIAGNOSIS — J301 Allergic rhinitis due to pollen: Secondary | ICD-10-CM | POA: Diagnosis not present

## 2021-02-16 ENCOUNTER — Encounter (HOSPITAL_COMMUNITY): Payer: Self-pay

## 2021-02-16 ENCOUNTER — Other Ambulatory Visit: Payer: Self-pay | Admitting: Physician Assistant

## 2021-02-16 ENCOUNTER — Inpatient Hospital Stay (HOSPITAL_COMMUNITY)
Admission: EM | Admit: 2021-02-16 | Discharge: 2021-02-18 | DRG: 100 | Disposition: A | Discharge status: Home / Self Care | Payer: Medicare HMO | Attending: Family Medicine | Admitting: Family Medicine

## 2021-02-16 ENCOUNTER — Emergency Department (HOSPITAL_COMMUNITY): Payer: Medicare HMO

## 2021-02-16 ENCOUNTER — Other Ambulatory Visit: Payer: Self-pay

## 2021-02-16 DIAGNOSIS — Z8261 Family history of arthritis: Secondary | ICD-10-CM | POA: Diagnosis not present

## 2021-02-16 DIAGNOSIS — I611 Nontraumatic intracerebral hemorrhage in hemisphere, cortical: Secondary | ICD-10-CM | POA: Diagnosis present

## 2021-02-16 DIAGNOSIS — R0902 Hypoxemia: Secondary | ICD-10-CM | POA: Diagnosis not present

## 2021-02-16 DIAGNOSIS — G936 Cerebral edema: Secondary | ICD-10-CM | POA: Diagnosis not present

## 2021-02-16 DIAGNOSIS — I4891 Unspecified atrial fibrillation: Secondary | ICD-10-CM | POA: Diagnosis present

## 2021-02-16 DIAGNOSIS — R4020 Unspecified coma: Secondary | ICD-10-CM | POA: Diagnosis not present

## 2021-02-16 DIAGNOSIS — I700 Atherosclerosis of aorta: Secondary | ICD-10-CM | POA: Diagnosis present

## 2021-02-16 DIAGNOSIS — G9389 Other specified disorders of brain: Secondary | ICD-10-CM | POA: Diagnosis not present

## 2021-02-16 DIAGNOSIS — C719 Malignant neoplasm of brain, unspecified: Secondary | ICD-10-CM | POA: Diagnosis not present

## 2021-02-16 DIAGNOSIS — R4182 Altered mental status, unspecified: Secondary | ICD-10-CM | POA: Diagnosis not present

## 2021-02-16 DIAGNOSIS — Z20822 Contact with and (suspected) exposure to covid-19: Secondary | ICD-10-CM | POA: Diagnosis not present

## 2021-02-16 DIAGNOSIS — R404 Transient alteration of awareness: Secondary | ICD-10-CM | POA: Diagnosis not present

## 2021-02-16 DIAGNOSIS — F32A Depression, unspecified: Secondary | ICD-10-CM | POA: Diagnosis not present

## 2021-02-16 DIAGNOSIS — E872 Acidosis, unspecified: Secondary | ICD-10-CM | POA: Diagnosis not present

## 2021-02-16 DIAGNOSIS — Z881 Allergy status to other antibiotic agents status: Secondary | ICD-10-CM

## 2021-02-16 DIAGNOSIS — Z808 Family history of malignant neoplasm of other organs or systems: Secondary | ICD-10-CM | POA: Diagnosis not present

## 2021-02-16 DIAGNOSIS — K219 Gastro-esophageal reflux disease without esophagitis: Secondary | ICD-10-CM | POA: Diagnosis not present

## 2021-02-16 DIAGNOSIS — G4733 Obstructive sleep apnea (adult) (pediatric): Secondary | ICD-10-CM | POA: Diagnosis present

## 2021-02-16 DIAGNOSIS — R569 Unspecified convulsions: Secondary | ICD-10-CM | POA: Diagnosis not present

## 2021-02-16 DIAGNOSIS — Z7951 Long term (current) use of inhaled steroids: Secondary | ICD-10-CM

## 2021-02-16 DIAGNOSIS — I4892 Unspecified atrial flutter: Secondary | ICD-10-CM

## 2021-02-16 DIAGNOSIS — G43909 Migraine, unspecified, not intractable, without status migrainosus: Secondary | ICD-10-CM | POA: Diagnosis present

## 2021-02-16 DIAGNOSIS — I10 Essential (primary) hypertension: Secondary | ICD-10-CM | POA: Diagnosis not present

## 2021-02-16 DIAGNOSIS — I609 Nontraumatic subarachnoid hemorrhage, unspecified: Secondary | ICD-10-CM

## 2021-02-16 DIAGNOSIS — G40409 Other generalized epilepsy and epileptic syndromes, not intractable, without status epilepticus: Secondary | ICD-10-CM | POA: Diagnosis not present

## 2021-02-16 DIAGNOSIS — Z743 Need for continuous supervision: Secondary | ICD-10-CM | POA: Diagnosis not present

## 2021-02-16 DIAGNOSIS — Z96652 Presence of left artificial knee joint: Secondary | ICD-10-CM | POA: Diagnosis present

## 2021-02-16 DIAGNOSIS — E669 Obesity, unspecified: Secondary | ICD-10-CM | POA: Diagnosis present

## 2021-02-16 DIAGNOSIS — D496 Neoplasm of unspecified behavior of brain: Secondary | ICD-10-CM | POA: Diagnosis not present

## 2021-02-16 DIAGNOSIS — M109 Gout, unspecified: Secondary | ICD-10-CM | POA: Diagnosis present

## 2021-02-16 DIAGNOSIS — E039 Hypothyroidism, unspecified: Secondary | ICD-10-CM | POA: Diagnosis not present

## 2021-02-16 DIAGNOSIS — Z6838 Body mass index (BMI) 38.0-38.9, adult: Secondary | ICD-10-CM | POA: Diagnosis not present

## 2021-02-16 DIAGNOSIS — Z823 Family history of stroke: Secondary | ICD-10-CM | POA: Diagnosis not present

## 2021-02-16 DIAGNOSIS — E785 Hyperlipidemia, unspecified: Secondary | ICD-10-CM | POA: Diagnosis present

## 2021-02-16 DIAGNOSIS — Z91040 Latex allergy status: Secondary | ICD-10-CM

## 2021-02-16 DIAGNOSIS — S066X0A Traumatic subarachnoid hemorrhage without loss of consciousness, initial encounter: Secondary | ICD-10-CM | POA: Diagnosis not present

## 2021-02-16 DIAGNOSIS — M1990 Unspecified osteoarthritis, unspecified site: Secondary | ICD-10-CM | POA: Diagnosis present

## 2021-02-16 DIAGNOSIS — G40909 Epilepsy, unspecified, not intractable, without status epilepticus: Secondary | ICD-10-CM | POA: Diagnosis not present

## 2021-02-16 DIAGNOSIS — F419 Anxiety disorder, unspecified: Secondary | ICD-10-CM | POA: Diagnosis present

## 2021-02-16 DIAGNOSIS — M8580 Other specified disorders of bone density and structure, unspecified site: Secondary | ICD-10-CM | POA: Diagnosis present

## 2021-02-16 DIAGNOSIS — Z79890 Hormone replacement therapy (postmenopausal): Secondary | ICD-10-CM

## 2021-02-16 DIAGNOSIS — Z87891 Personal history of nicotine dependence: Secondary | ICD-10-CM

## 2021-02-16 DIAGNOSIS — Z8544 Personal history of malignant neoplasm of other female genital organs: Secondary | ICD-10-CM | POA: Diagnosis not present

## 2021-02-16 DIAGNOSIS — J9601 Acute respiratory failure with hypoxia: Secondary | ICD-10-CM

## 2021-02-16 DIAGNOSIS — Z888 Allergy status to other drugs, medicaments and biological substances status: Secondary | ICD-10-CM

## 2021-02-16 DIAGNOSIS — Z79899 Other long term (current) drug therapy: Secondary | ICD-10-CM

## 2021-02-16 DIAGNOSIS — M810 Age-related osteoporosis without current pathological fracture: Secondary | ICD-10-CM | POA: Diagnosis present

## 2021-02-16 DIAGNOSIS — C799 Secondary malignant neoplasm of unspecified site: Secondary | ICD-10-CM

## 2021-02-16 LAB — I-STAT CHEM 8, ED
BUN: 7 mg/dL — ABNORMAL LOW (ref 8–23)
Calcium, Ion: 1.14 mmol/L — ABNORMAL LOW (ref 1.15–1.40)
Chloride: 108 mmol/L (ref 98–111)
Creatinine, Ser: 1 mg/dL (ref 0.44–1.00)
Glucose, Bld: 113 mg/dL — ABNORMAL HIGH (ref 70–99)
HCT: 40 % (ref 36.0–46.0)
Hemoglobin: 13.6 g/dL (ref 12.0–15.0)
Potassium: 4 mmol/L (ref 3.5–5.1)
Sodium: 141 mmol/L (ref 135–145)
TCO2: 20 mmol/L — ABNORMAL LOW (ref 22–32)

## 2021-02-16 LAB — CBC WITH DIFFERENTIAL/PLATELET
Abs Immature Granulocytes: 0.05 10*3/uL (ref 0.00–0.07)
Basophils Absolute: 0 10*3/uL (ref 0.0–0.1)
Basophils Relative: 0 %
Eosinophils Absolute: 0 10*3/uL (ref 0.0–0.5)
Eosinophils Relative: 0 %
HCT: 41.9 % (ref 36.0–46.0)
Hemoglobin: 13.2 g/dL (ref 12.0–15.0)
Immature Granulocytes: 1 %
Lymphocytes Relative: 18 %
Lymphs Abs: 1.8 10*3/uL (ref 0.7–4.0)
MCH: 31.4 pg (ref 26.0–34.0)
MCHC: 31.5 g/dL (ref 30.0–36.0)
MCV: 99.5 fL (ref 80.0–100.0)
Monocytes Absolute: 0.6 10*3/uL (ref 0.1–1.0)
Monocytes Relative: 6 %
Neutro Abs: 7.3 10*3/uL (ref 1.7–7.7)
Neutrophils Relative %: 75 %
Platelets: 183 10*3/uL (ref 150–400)
RBC: 4.21 MIL/uL (ref 3.87–5.11)
RDW: 13.1 % (ref 11.5–15.5)
WBC: 9.9 10*3/uL (ref 4.0–10.5)
nRBC: 0 % (ref 0.0–0.2)

## 2021-02-16 LAB — COMPREHENSIVE METABOLIC PANEL
ALT: 12 U/L (ref 0–44)
AST: 32 U/L (ref 15–41)
Albumin: 3.4 g/dL — ABNORMAL LOW (ref 3.5–5.0)
Alkaline Phosphatase: 81 U/L (ref 38–126)
Anion gap: 12 (ref 5–15)
BUN: 7 mg/dL — ABNORMAL LOW (ref 8–23)
CO2: 19 mmol/L — ABNORMAL LOW (ref 22–32)
Calcium: 8.6 mg/dL — ABNORMAL LOW (ref 8.9–10.3)
Chloride: 109 mmol/L (ref 98–111)
Creatinine, Ser: 1.06 mg/dL — ABNORMAL HIGH (ref 0.44–1.00)
GFR, Estimated: 55 mL/min — ABNORMAL LOW (ref >60)
Glucose, Bld: 120 mg/dL — ABNORMAL HIGH (ref 70–99)
Potassium: 4.1 mmol/L (ref 3.5–5.1)
Sodium: 140 mmol/L (ref 135–145)
Total Bilirubin: 0.5 mg/dL (ref 0.3–1.2)
Total Protein: 6.6 g/dL (ref 6.5–8.1)

## 2021-02-16 LAB — CBG MONITORING, ED: Glucose-Capillary: 116 mg/dL — ABNORMAL HIGH (ref 70–99)

## 2021-02-16 LAB — RESP PANEL BY RT-PCR (FLU A&B, COVID) ARPGX2
Influenza A by PCR: NEGATIVE
Influenza B by PCR: NEGATIVE
SARS Coronavirus 2 by RT PCR: NEGATIVE

## 2021-02-16 MED ORDER — LEVETIRACETAM IN NACL 1000 MG/100ML IV SOLN
1000.0000 mg | Freq: Once | INTRAVENOUS | Status: AC
  Administered 2021-02-16: 1000 mg via INTRAVENOUS
  Filled 2021-02-16: qty 100

## 2021-02-16 MED ORDER — LORAZEPAM 2 MG/ML IJ SOLN
INTRAMUSCULAR | Status: AC
  Administered 2021-02-16: 2 mg via INTRAVENOUS
  Filled 2021-02-16: qty 1

## 2021-02-16 MED ORDER — SODIUM CHLORIDE 0.9 % IV SOLN: 2000.0000 mg | Freq: Once | INTRAVENOUS | Status: DC

## 2021-02-16 MED ORDER — LORAZEPAM 2 MG/ML IJ SOLN: 2.0000 mg | Freq: Once | INTRAMUSCULAR | Status: AC

## 2021-02-16 NOTE — ED Triage Notes (Signed)
Pt arrived via [facility] from home for a witnessed seizure by husband per EMS. Per EMS pt 's husband was unsure of how long seizure was. Pt had a second seizure witnessed by EMS that lasted 3 mins. Pt became apneic and cyanotic. EMS gave midazolam 5mg  IM and used BVM on pt then NRB then [place]. Pt arrived post tictal and responds to verbal stimuli, but doesn't answer questions. Per EMS the husband told them one of pt's meds was adjusted, but unsure of which one.VSS

## 2021-02-16 NOTE — ED Provider Notes (Signed)
[facility] EMERGENCY DEPARTMENT Provider Note   CSN: [id number] Arrival date & time: 02/16/21  1705     History Chief Complaint  Patient presents with   Seizures    Beverly Ballard is a 75 y.o. female.  The history is provided by medical records. The history is limited by the condition of the patient. No language interpreter was used.  Seizures Seizure activity on arrival: yes   Seizure type:  Grand mal Episode characteristics: unresponsiveness   Postictal symptoms: somnolence   Return to baseline: no    LVL 5 caveat for seizure and AMS     Past Medical History:  Diagnosis Date   Allergy    Anxiety and depression 12/19/2009   Arthritis    Cancer ([place])    vaginal   Constipation    Degenerative disorder of eye    Diverticulosis    Essential hypertension, benign 06/26/2009   GERD 05/27/2007   takes Nexium daily   Heart murmur    yrs ago   History of gout    History of migraine many yrs ago   Hyperlipidemia    takes Pravastatin daily   Hypothyroidism    Insomnia    but doesn't take any meds   LOW BACK PAIN 05/27/2007   Numbness    left toes    Osteoporosis    Pneumonia    Seizures ([place])    last 1983, none since then- last seizure 1988 per pt    Sleep apnea    wears CPAP    Patient Active Problem List   Diagnosis Date Noted   Generalized anxiety disorder 08/11/2020   Insomnia 08/11/2020   Major depressive disorder, recurrent episode, moderate ([place]) 08/11/2020   Obstructive sleep apnea 08/11/2020   Ankle edema, bilateral 10/31/2019   Excessive daytime sleepiness 06/28/2019   Shortness of breath 06/28/2019   Healthcare maintenance 06/28/2019   ANA positive 06/28/2019   Bronchiectasis ([place]) 06/28/2019   Depression, major, single episode, moderate ([place]) 11/19/2018   Fatigue 03/23/2018   Left knee pain 01/12/2017   Ovarian cyst 10/30/2016   Aortic atherosclerosis ([place]) 11/19/2015   Hyperlipemia 04/03/2015   S/P revision of total  knee 09/19/2014   Seizure disorder ([place]) 01/24/2014   Obesity, BMI unknown 01/24/2014   Osteopenia, stable on dexa 2010 - followed by gyn 06/15/2012   Pap smear abnormality of cervix/human papillomavirus (HPV) positive - 09/2011, followed by gyn 06/15/2012   Essential hypertension 07/03/2009   Hypothyroidism 05/27/2007   Allergic rhinitis 05/27/2007   GERD 05/27/2007    Past Surgical History:  Procedure Laterality Date   ADENOIDECTOMY     BACK SURGERY     lumbar x2   BRONCHIAL BIOPSY  09/12/2019   Procedure: BRONCHIAL BIOPSIES;  Surgeon: Laurin Coder, MD;  Location: [facility];  Service: Endoscopy;;   BRONCHIAL WASHINGS  09/12/2019   Procedure: BRONCHIAL WASHINGS;  Surgeon: Laurin Coder, MD;  Location: [facility];  Service: Endoscopy;;   COLONOSCOPY  2009   ESOPHAGOGASTRODUODENOSCOPY     Heel spurs Bilateral    HEMOSTASIS CONTROL  09/12/2019   Procedure: HEMOSTASIS CONTROL;  Surgeon: Laurin Coder, MD;  Location: [facility];  Service: Endoscopy;;  epi   KNEE ARTHROPLASTY Left 11/07/2013   Procedure: COMPUTER ASSISTED TOTAL KNEE ARTHROPLASTY;  Surgeon: Marybelle Killings, MD;  Location: [place];  Service: Orthopedics;  Laterality: Left;  Left Total Knee Arthroplasty, Cemented, Computer Assist   TONSILLECTOMY     TOTAL KNEE REVISION Left  09/19/2014   Procedure: LEFT TOTAL KNEE REVISION;  Surgeon: Leandrew Koyanagi, MD;  Location: [place];  Service: Orthopedics;  Laterality: Left;   UPPER GASTROINTESTINAL ENDOSCOPY     VIDEO BRONCHOSCOPY N/A 09/12/2019   Procedure: VIDEO BRONCHOSCOPY WITH FLUORO;  Surgeon: Laurin Coder, MD;  Location: [facility];  Service: Endoscopy;  Laterality: N/A;     OB History   No obstetric history on file.     Family History  Problem Relation Age of Onset   Aortic dissection Father    Arthritis Mother    Stroke Mother 65   Heart murmur Other    Throat cancer Sister    Colon cancer Neg Hx    Esophageal cancer Neg Hx    Stomach cancer Neg  Hx    Rectal cancer Neg Hx    Colon polyps Neg Hx     Social History   Tobacco Use   Smoking status: Former    Packs/day: 0.25    Years: 20.00    Pack years: 5.00    Types: Cigarettes    Quit date: 09/08/1978    Years since quitting: 42.4   Smokeless tobacco: Never   Tobacco comments:    quit smoking 16yrs ago  Vaping Use   Vaping Use: Never used  Substance Use Topics   Alcohol use: No   Drug use: No    Home Medications Prior to Admission medications   Medication Sig Start Date End Date Taking? Authorizing Provider  albuterol (VENTOLIN HFA) 108 (90 Base) MCG/ACT inhaler Inhale 1-2 puffs into the lungs every 6 (six) hours as needed for wheezing or shortness of breath. 05/11/20   Providence Lanius A, PA-C  b complex vitamins tablet Take 1 tablet by mouth daily.    [provider]  beta carotene w/minerals (OCUVITE) tablet Take 1 tablet by mouth daily.    [provider]  Budeson-Glycopyrrol-Formoterol (BREZTRI AEROSPHERE) 160-9-4.8 MCG/ACT AERO Inhale 2 puffs into the lungs 2 (two) times daily. Patient not taking: Reported on 02/13/2021 12/04/20   Caren Macadam, MD  busPIRone (BUSPAR) 10 MG tablet Take 1 tablet (10 mg total) by mouth 2 (two) times daily. 01/23/21   Addison Lank, PA-C  Cholecalciferol (VITAMIN D3 PO) Take by mouth.    [provider]  cyclobenzaprine (FLEXERIL) 10 MG tablet Take 1 tablet (10 mg total) by mouth 2 (two) times daily as needed for muscle spasms. Patient not taking: Reported on 02/13/2021 06/03/19   Aundra Dubin, PA-C  EPINEPHrine 0.3 mg/0.3 mL IJ SOAJ injection SMARTSIG:Injection As Directed 09/14/19   [provider]  esomeprazole (NEXIUM) 20 MG capsule Take 2 capsules (40 mg total) by mouth daily before breakfast. Patient taking differently: Take 20 mg by mouth daily before breakfast. 03/21/19   Laurey Morale, MD  furosemide (LASIX) 20 MG tablet TAKE 1 TABLET BY MOUTH IN THE MORNING 01/23/20   Mannam,  Praveen, MD  hyoscyamine (LEVSIN SL) 0.125 MG SL tablet Place 1-2 tablets (0.125-0.25 mg total) under the tongue every 6 (six) hours as needed. 05/15/20   Armbruster, Carlota Raspberry, MD  levocetirizine (XYZAL) 5 MG tablet Take 5 mg by mouth daily.  03/05/16   [provider]  levothyroxine (SYNTHROID) 88 MCG tablet Take 1 tablet (88 mcg total) by mouth daily. 10/11/20   Philemon Kingdom, MD  lisinopril (ZESTRIL) 5 MG tablet TAKE 1 TABLET BY MOUTH EVERY DAY 10/19/20   Koberlein, Steele Berg, MD  LORazepam (ATIVAN) 1 MG tablet Take  1 tablet (1 mg total) by mouth every 6 (six) hours as needed for anxiety. 12/07/20   Donnal Moat T, PA-C  ondansetron (ZOFRAN ODT) 4 MG disintegrating tablet Take 1 tablet (4 mg total) by mouth every 6 (six) hours as needed for nausea or vomiting. 03/30/19   Caren Macadam, MD  Peppermint Oil (IBGARD) 90 MG CPCR Take as directed Patient not taking: Reported on 02/13/2021 11/30/20   Yetta Flock, MD  pravastatin (PRAVACHOL) 20 MG tablet TAKE 1 TABLET (20 MG TOTAL) BY MOUTH DAILY. 10/19/20   Caren Macadam, MD  sertraline (ZOLOFT) 100 MG tablet Take 2 tablets (200 mg total) by mouth daily. 09/04/20   Addison Lank, PA-C  Spacer/Aero-Holding Chambers (AEROCHAMBER PLUS) inhaler Use as instructed 11/16/20   Caren Macadam, MD  zonisamide (ZONEGRAN) 100 MG capsule Take 2 capsules twice a day 11/07/20   Cameron Sprang, MD    Allergies    Lamotrigine, Bupropion, Carbamazepine, Levetiracetam, Tramadol, Adhesive [tape], Amitriptyline, Clarithromycin, Doxycycline, Nickel, Other, Topamax [topiramate], Estradiol, Latex, and Phenytoin sodium extended  Review of Systems   Review of Systems  Unable to perform ROS: Patient unresponsive  Neurological:  Positive for seizures.   Physical Exam Updated Vital Signs BP (!) 108/45   Pulse 63   Temp 98.3 F (36.8 C) (Axillary)   Resp 20   Ht 5\' 5"  (1.651 m)   Wt 104.1 kg   SpO2 95%   BMI 38.19 kg/m   Physical  Exam Vitals and nursing note reviewed.  Constitutional:      General: She is not in acute distress.    Appearance: She is ill-appearing. She is not toxic-appearing or diaphoretic.  HENT:     Head: Normocephalic.     Nose: No congestion.     Mouth/Throat:     Mouth: Mucous membranes are moist.  Eyes:     Conjunctiva/sclera: Conjunctivae normal.     Pupils: Pupils are equal, round, and reactive to light.  Cardiovascular:     Rate and Rhythm: Normal rate.     Pulses: Normal pulses.     Heart sounds: No murmur heard. Pulmonary:     Effort: Pulmonary effort is normal.     Breath sounds: Rhonchi present. No wheezing or rales.  Chest:     Chest wall: No tenderness.  Abdominal:     General: Abdomen is flat.     Tenderness: There is no abdominal tenderness.  Musculoskeletal:        General: No tenderness.     Cervical back: No tenderness.  Skin:    Capillary Refill: Capillary refill takes less than 2 seconds.     Findings: No erythema.  Neurological:     Mental Status: She is unresponsive.     Comments: Patient is somnolent after seizure.  Saturations have improved on oxygen support.  Unresponsive immediately postictally.    ED Results / Procedures / Treatments   Labs (all labs ordered are listed, but only abnormal results are displayed) Labs Reviewed  COMPREHENSIVE METABOLIC PANEL - Abnormal; Notable for the following components:      Result Value   CO2 19 (*)    Glucose, Bld 120 (*)    BUN 7 (*)    Creatinine, Ser 1.06 (*)    Calcium 8.6 (*)    Albumin 3.4 (*)    GFR, Estimated 55 (*)    All other components within normal limits  CBG MONITORING, ED - Abnormal; Notable for the following components:  Glucose-Capillary 116 (*)    All other components within normal limits  I-STAT CHEM 8, ED - Abnormal; Notable for the following components:   BUN 7 (*)    Glucose, Bld 113 (*)    Calcium, Ion 1.14 (*)    TCO2 20 (*)    All other components within normal limits  RESP  PANEL BY RT-PCR (FLU A&B, COVID) ARPGX2  URINE CULTURE  CBC WITH DIFFERENTIAL/PLATELET  URINALYSIS, ROUTINE W REFLEX MICROSCOPIC  TSH    EKG EKG Interpretation  Date/Time:  Saturday February 16 2021 17:10:50 EDT Ventricular Rate:  98 PR Interval:    QRS Duration: 87 QT Interval:  395 QTC Calculation: 462 R Axis:   40 Text Interpretation: Atrial fibrillation Ventricular premature complex ST elevation, consider inferior injury When compared to prior, now a flutter. No STEMI Confirmed by Antony Blackbird [phone]) on 02/16/2021 5:13:45 PM  Radiology CT HEAD WO CONTRAST (5MM)  Result Date: 02/16/2021 CLINICAL DATA:  Seizure. EXAM: CT HEAD WITHOUT CONTRAST TECHNIQUE: Contiguous axial images were obtained from the base of the skull through the vertex without intravenous contrast. COMPARISON:  March 03, 2020. FINDINGS: Brain: 20 x 15 mm rounded mass is noted in the left posterior parietal cortex with surrounding white matter edema concerning for malignancy or neoplasm. Ventricular size is within normal limits. No definite midline shift is noted. Small focus of subarachnoid hemorrhage may be present in this area. Vascular: No hyperdense vessel or unexpected calcification. Skull: Normal. Negative for fracture or focal lesion. Sinuses/Orbits: No acute finding. Other: None. IMPRESSION: 2.0 x 1.5 cm rounded mass is noted in the left posterior parietal cortex with surrounding white matter edema concerning for neoplasm or malignancy. MRI with and without gadolinium is recommended for further evaluation. Also noted is possible small focus of subarachnoid hemorrhage in the left posterior parietal region. Critical Value/emergent results were called by telephone at the time of interpretation on 02/16/2021 at 6:56 pm to provider [facility] , who verbally acknowledged these results. Electronically Signed   By: Marijo Conception M.D.   On: 02/16/2021 18:56   DG Chest Portable 1 View  Result Date:  02/16/2021 CLINICAL DATA:  Hypoxia and seizure EXAM: PORTABLE CHEST 1 VIEW COMPARISON:  11/21/2020 FINDINGS: The cardiomediastinal silhouette is unchanged. Increasing interstitial and airspace opacities bilaterally are noted. Elevated RIGHT hemidiaphragm is again noted. No pneumothorax or large pleural effusion. IMPRESSION: Increasing interstitial and airspace opacities bilaterally which may represent edema versus infection/pneumonia. Electronically Signed   By: Margarette Canada M.D.   On: 02/16/2021 18:19    Procedures Procedures   Medications Ordered in ED Medications  LORazepam (ATIVAN) injection 2 mg (2 mg Intravenous Given 02/16/21 1736)  levETIRAcetam (KEPPRA) IVPB 1000 mg/100 mL premix (0 mg Intravenous Stopped 02/16/21 1853)    And  levETIRAcetam (KEPPRA) IVPB 1000 mg/100 mL premix (0 mg Intravenous Stopped 02/16/21 1853)    ED Course  I have reviewed the triage vital signs and the nursing notes.  Pertinent labs & imaging results that were available during my care of the patient were reviewed by me and considered in my medical decision making (see chart for details).    MDM Rules/Calculators/A&P                           JENINE KRISHER is a 75 y.o. female with a past medical history significant for previous seizures, hypertension, hypothyroidism, hyperlipidemia, anxiety, obesity, GERD, osteopenia, sleep apnea, and gout who presents  for seizures.  According to EMS report to nursing, patient had a seizure today after not having 1 for "a long time".  She then had 1 with EMS lasting for several minutes needing midazolam to abort the seizure.  Patient remained somnolent and postictal and on my initial evaluation was again having seizure.  Seizure lasted for several minutes.  Patient was having generalized tonic-clonic shaking and was frothing at the mouth.  Unclear if she was aspirating.  I quickly positioned her airway and increase the oxygen supply.  Patient's oxygen saturations were in  the 50s during the seizure and immediately following oxygen has been.  Oxygen demand has been de-escalating down down to nasal cannula.   Spoke to neurology given her complex history of allergies and intolerances and they recommend loading with Keppra.  She has had intolerance to this before but they feel it is needed.  We will order this.  They do agree with work-up including a head CT and agree she will need admission when work-up is completed.  They will see her shortly.  Patient is still postictal, will reassess and discuss with her when she starts to wake up more.  Of note, documentation shows that she was going to have a recent medication change to her mental health medications but is unclear what was changed or if that had already been done.   7:17 PM Was called by radiology to report that patient has what appears to be a left parietal mass of some kind that has some edema and subarachnoid hemorrhage.  Neurosurgery will be paged.  Will call for admission as well after speaking with neurosurgery.   8:06 PM Just spoke to hospitalist team about admission and they raised concern about the patient's profound hypoxia altered mental status, still not being at her baseline, and the subarachnoid hemorrhage that was seen today and they requested we speak with the ICU to discuss possible ICU admission.  Will call ICU.  Final Clinical Impression(s) / ED Diagnoses Final diagnoses:  Seizures ([place])  SAH (subarachnoid hemorrhage) ([place])  Cerebral edema ([facility])  Brain mass      Clinical Impression: 1. Seizures ([place])   2. SAH (subarachnoid hemorrhage) ([facility])   3. Cerebral edema ([place])   4. Brain mass     Disposition: Admit  This note was prepared with assistance of Dragon voice recognition software. Occasional wrong-word or sound-a-like substitutions may have occurred due to the inherent limitations of voice recognition software.     Kaileah Shevchenko, Gwenyth Allegra, MD 02/16/21 2049

## 2021-02-16 NOTE — ED Notes (Signed)
Patient transported to CT 

## 2021-02-16 NOTE — ED Notes (Signed)
Received pt in [facility].

## 2021-02-16 NOTE — Consult Note (Signed)
NEUROLOGY CONSULTATION NOTE   Date of service: February 16, 2021 Patient Name: Beverly Ballard MRN:  [id number] DOB:  12-Oct-1945 Reason for consult: "Seizures" Requesting Provider: Courtney Paris, * _ _ _   _ __   _ __ _ _  __ __   _ __   __ _  History of Present Illness  Beverly Ballard is a 75 y.o. female with PMH significant for anxiety, depression, constipation, hypertension, hypothyroidism, sleep apnea, seizures on zonisamide 100 mg twice daily who had 3 seizures today.   Patient's husband noted a seizure of unknown duration and he called EMS.  In route, patient had a 3-minute seizure and was given midazolam 5 mg IM and in the hospital she had another 5-minute long seizure and was loaded up with Keppra.  We were asked to evaluate this patient.  Husband reports the patient had medication adjustment recently to her psych medications but no changes to her seizure meds.  In the ED she is afebrile, somewhat hypertensive, vitals otherwise normal.  Her glucose is normal.  Chemistry does not demonstrate any significant abnormality.  Husband reports that she is compliant with her zonisamide and has not had a seizure since at least 2014.  She did take some medication last night to help her sleep.  This is over-the-counter medication and her husband is not sure what she took.  She also had a flu shot on Monday.  Husband reports that she did not report any symptoms of infection over the last few days with no fever, no chills, she has had respiratory issues for several years but nothing new that she complained.  She did not report any urinary urgency or burning or any GI symptoms.  She does not drink alcohol.  Patient is postictal, she will make brief contact with me and moans and groans but would not provide any meaningful history for me today.  ROS  I am unable to obtain a detailed review of system due to postictal encephalopathy.  Past History   Past Medical History:  Diagnosis Date    Allergy    Anxiety and depression 12/19/2009   Arthritis    Cancer ([place])    vaginal   Constipation    Degenerative disorder of eye    Diverticulosis    Essential hypertension, benign 06/26/2009   GERD 05/27/2007   takes Nexium daily   Heart murmur    yrs ago   History of gout    History of migraine many yrs ago   Hyperlipidemia    takes Pravastatin daily   Hypothyroidism    Insomnia    but doesn't take any meds   LOW BACK PAIN 05/27/2007   Numbness    left toes    Osteoporosis    Pneumonia    Seizures ([place])    last 1983, none since then- last seizure 1988 per pt    Sleep apnea    wears CPAP   Past Surgical History:  Procedure Laterality Date   ADENOIDECTOMY     BACK SURGERY     lumbar x2   BRONCHIAL BIOPSY  09/12/2019   Procedure: BRONCHIAL BIOPSIES;  Surgeon: Laurin Coder, MD;  Location: [facility];  Service: Endoscopy;;   BRONCHIAL WASHINGS  09/12/2019   Procedure: BRONCHIAL WASHINGS;  Surgeon: Laurin Coder, MD;  Location: [facility];  Service: Endoscopy;;   COLONOSCOPY  2009   ESOPHAGOGASTRODUODENOSCOPY     Heel spurs Bilateral    HEMOSTASIS CONTROL  09/12/2019  Procedure: HEMOSTASIS CONTROL;  Surgeon: Laurin Coder, MD;  Location: [facility];  Service: Endoscopy;;  epi   KNEE ARTHROPLASTY Left 11/07/2013   Procedure: COMPUTER ASSISTED TOTAL KNEE ARTHROPLASTY;  Surgeon: Marybelle Killings, MD;  Location: [place];  Service: Orthopedics;  Laterality: Left;  Left Total Knee Arthroplasty, Cemented, Computer Assist   TONSILLECTOMY     TOTAL KNEE REVISION Left 09/19/2014   Procedure: LEFT TOTAL KNEE REVISION;  Surgeon: Leandrew Koyanagi, MD;  Location: [facility];  Service: Orthopedics;  Laterality: Left;   UPPER GASTROINTESTINAL ENDOSCOPY     VIDEO BRONCHOSCOPY N/A 09/12/2019   Procedure: VIDEO BRONCHOSCOPY WITH FLUORO;  Surgeon: Laurin Coder, MD;  Location: [facility];  Service: Endoscopy;  Laterality: N/A;   Family History  Problem Relation Age of Onset    Aortic dissection Father    Arthritis Mother    Stroke Mother 28   Heart murmur Other    Throat cancer Sister    Colon cancer Neg Hx    Esophageal cancer Neg Hx    Stomach cancer Neg Hx    Rectal cancer Neg Hx    Colon polyps Neg Hx    Social History   Socioeconomic History   Marital status: Married    Spouse name: Not on file   Number of children: 1   Years of education: Not on file   Highest education level: Not on file  Occupational History   Occupation: Retired  Tobacco Use   Smoking status: Former    Packs/day: 0.25    Years: 20.00    Pack years: 5.00    Types: Cigarettes    Quit date: 09/08/1978    Years since quitting: 42.4   Smokeless tobacco: Never   Tobacco comments:    quit smoking 68yrs ago  Vaping Use   Vaping Use: Never used  Substance and Sexual Activity   Alcohol use: No   Drug use: No   Sexual activity: Never    Birth control/protection: Post-menopausal  Other Topics Concern   Not on file  Social History Narrative   Right handed    Lives with husband    Social Determinants of Health   Financial Resource Strain: Low Risk    Difficulty of Paying Living Expenses: Not hard at all  Food Insecurity: No Food Insecurity   Worried About Charity fundraiser in the Last Year: Never true   Ran Out of Food in the Last Year: Never true  Transportation Needs: No Transportation Needs   Lack of Transportation (Medical): No   Lack of Transportation (Non-Medical): No  Physical Activity: Inactive   Days of Exercise per Week: 0 days   Minutes of Exercise per Session: 0 min  Stress: No Stress Concern Present   Feeling of Stress : Not at all  Social Connections: Moderately Isolated   Frequency of Communication with Friends and Family: More than three times a week   Frequency of Social Gatherings with Friends and Family: Twice a week   Attends Religious Services: Never   Marine scientist or Organizations: No   Attends Music therapist:  Never   Marital Status: Married   Allergies  Allergen Reactions   Lamotrigine Swelling    Tongue swelling   Bupropion Other (See Comments)    not known   Carbamazepine Other (See Comments)    Hypnatremia   Levetiracetam     Other reaction(s): Dizziness (intolerance)   Tramadol Other (See Comments)  not known   Adhesive [Tape]     ?  Blister after knee surgery   Amitriptyline     nausea   Clarithromycin     Unsure of reaction     Doxycycline     Interfered with seizure medication   Nickel     Had to remove knee implant with nickel and replace it   Other     Metal and perfumes   Topamax [Topiramate] Nausea And Vomiting   Estradiol Rash   Latex Rash   Phenytoin Sodium Extended Other (See Comments)    drowsiness    Medications  (Not in a hospital admission)    Vitals   Vitals:   02/16/21 1800 02/16/21 1850 02/16/21 1900 02/16/21 1923  BP: (!) 91/42 (!) 95/46 (!) 98/40   Pulse: 73 74 80   Resp: 16 18 (!) 21   Temp:      TempSrc:      SpO2: 100% 100% 100% 99%  Weight:      Height:         Body mass index is 38.19 kg/m.  Physical Exam   General: Laying comfortably in bed; in no acute distress. HENT: Normal oropharynx and mucosa. Normal external appearance of ears and nose. Neck: Supple, no pain or tenderness CV: No JVD. No peripheral edema. Pulmonary: Symmetric Chest rise. Normal respiratory effort. Abdomen: Soft to touch, non-tender. Ext: No cyanosis, edema, or deformity Skin: No rash. Normal palpation of skin.  Musculoskeletal: Normal digits and nails by inspection. No clubbing.  Neurologic Examination  Mental status/Cognition: Opens eyes to loud voice and to calling out her name.  She will make brief eye contact on both the left and the right.   Speech/language: Moans and groans but she is able to give me a thumbs up when asked and can stick her tongue out on command.    Cranial nerves:   CN II Pupils equal and reactive to light, no VF deficits    CN III,IV,VI EOM intact to doll's eye with no gaze preference.   CN V    CN VII no asymmetry, no nasolabial fold flattening   CN VIII Turns head to speech.   CN IX & X    CN XI    CN XII midline tongue protrusion   Motor:  Muscle bulk: normal, tone normal She will give me a thumbs up with her left hand and her right hand.  She is moving around the bed and moving all 4 extremities spontaneously.  Reflexes:  Right Left Comments  Pectoralis      Biceps (C5/6) 2 2   Brachioradialis (C5/6) 2 2    Triceps (C6/7) 2 2    Patellar (L3/4) 2 2    Achilles (S1)      Hoffman      Plantar     Jaw jerk    Sensation:  Light touch Localizes to pinch in all extremities.   Pin prick    Temperature    Vibration   Proprioception    Coordination/Complex Motor:  Unable to assess but no obvious ataxia or incoordination.  Labs   CBC:  Recent Labs  Lab 02/16/21 1734 02/16/21 1756  WBC 9.9  --   NEUTROABS 7.3  --   HGB 13.2 13.6  HCT 41.9 40.0  MCV 99.5  --   PLT 183  --     Basic Metabolic Panel:  Lab Results  Component Value Date   NA 141 02/16/2021  K 4.0 02/16/2021   CO2 19 (L) 02/16/2021   GLUCOSE 113 (H) 02/16/2021   BUN 7 (L) 02/16/2021   CREATININE 1.00 02/16/2021   CALCIUM 8.6 (L) 02/16/2021   GFRNONAA 55 (L) 02/16/2021   GFRAA >60 12/20/2019   Lipid Panel:  Lab Results  Component Value Date   LDLCALC 100 (H) 03/19/2020   HgbA1c:  Lab Results  Component Value Date   HGBA1C 5.7 06/24/2019   Urine Drug Screen: No results found for: LABOPIA, COCAINSCRNUR, LABBENZ, AMPHETMU, THCU, LABBARB  Alcohol Level No results found for: [facility]   Impression   ASIAH BROWDER is a 75 y.o. female with PMH significant for anxiety, depression, constipation, hypertension, hypothyroidism, sleep apnea, seizures on zonisamide 100 mg twice daily who had 3 seizures today.  She is postictal and encephalopathic but does give me a thumbs up, make eye contact and move all extremities  spontaneously.  She has no jerking/shaking.  No obvious provoking factors that I could identify on my discussion with husband or review of the labs.  Also some concern that her current seizure was much different than her seizures in the past.  She has had intolerance to Keppra in the past but seems to have allergies to multiple AEDs.  At this time I will start her on Keppra with plan to transition her to zonisamide when she is able to tolerate p.o. intake.  Recommendations  - CTH w/o contrast - Keppra 2 g IV once given in the emergency department. - Can start Keppra 500mg  BID IV - When she can tolerate PO intake, would recommend starting Zonisamide 150mg  BID. Would recommend overlapping Keppra and Zonisamide for 2 doses and then stop Keppra and continue only zonisamide. - She is not actively seizing and I do not think she would benefit from a cEEG at this time. - routine EEG in AM. ______________________________________________________________________   Thank you for the opportunity to take part in the care of this patient. If you have any further questions, please contact the neurology consultation attending.  Signed,  [place] Pager Number [phone] _ _ _   _ __   _ __ _ _  __ __   _ __   __ _

## 2021-02-16 NOTE — H&P (Addendum)
History and Physical    PHILANA YOUNIS QIW:[id number] DOB: [birth year] DOA: 02/16/2021  PCP: Caren Macadam, MD Patient coming from: Home  Chief Complaint: Seizures  HPI: RAMONA RUARK is a 75 y.o. female with medical history significant of seizure disorder on zonisamide, migraines, anxiety, depression, vaginal cancer, arthritis, chronic hypersensitivity pneumonitis, GERD, hypertension, gout, hyperlipidemia, hypothyroidism, OSA.  She was seen by her PCP 5 days ago for increased somnolence and dizziness.  She presents to the ED today via EMS from home after she had a seizure which was witnessed by her husband.  Husband unsure how long the seizure lasted.  Patient then had another seizure with EMS which lasted 3 minutes.  She became apneic and cyanotic.  Initially required BVM, then nonrebreather.  Patient was given midazolam 5 mg IM by EMS.  She arrived to the ED postictal and subsequently had another generalized tonic-clonic seizure witnessed by ED physician which lasted several minutes.  Oxygen saturation dropped to the 50s during the seizure.  Neurology recommended loading dose Keppra 2g and Ativan 2 mg. Labs showing WBC 9.9, hemoglobin 13.2, platelet count 183k.  Sodium 140, potassium 4.1, chloride 109, bicarb 19, anion gap 12, BUN 7, creatinine 1.0, glucose 120.  Calcium 8.6, albumin 3.4.  LFTs normal.  UA pending.  COVID and influenza PCR negative.  TSH pending.  EKG showing new onset atrial flutter, rate controlled.  Chest x-ray showing increasing interstitial and airspace opacities bilaterally which may represent edema versus infection/pneumonia.  CT head without contrast showing 2 x 1.5 cm rounded mass in the left posterior parietal cortex with surrounding white matter edema concerning for neoplasm or malignancy.  Also showing possible small focus of subarachnoid hemorrhage in the left posterior parietal region.  Neurosurgery consulted.  Informed by ED physician that Dr. Carson Myrtle from PCCM saw the  patient and felt that she was stable for progressive care unit.  Patient is somnolent but arousable.  Very confused, oriented to self only.  Lying naked in bed.  Not able to give any history.  No family at bedside.  Review of Systems:  All systems reviewed and apart from history of presenting illness, are negative.  Past Medical History:  Diagnosis Date   Allergy    Anxiety and depression 12/19/2009   Arthritis    Cancer ([place])    vaginal   Constipation    Degenerative disorder of eye    Diverticulosis    Essential hypertension, benign 06/26/2009   GERD 05/27/2007   takes Nexium daily   Heart murmur    yrs ago   History of gout    History of migraine many yrs ago   Hyperlipidemia    takes Pravastatin daily   Hypothyroidism    Insomnia    but doesn't take any meds   LOW BACK PAIN 05/27/2007   Numbness    left toes    Osteoporosis    Pneumonia    Seizures ([place])    last 1983, none since then- last seizure 1988 per pt    Sleep apnea    wears CPAP    Past Surgical History:  Procedure Laterality Date   ADENOIDECTOMY     BACK SURGERY     lumbar x2   BRONCHIAL BIOPSY  09/12/2019   Procedure: BRONCHIAL BIOPSIES;  Surgeon: Laurin Coder, MD;  Location: [facility];  Service: Endoscopy;;   BRONCHIAL WASHINGS  09/12/2019   Procedure: BRONCHIAL WASHINGS;  Surgeon: Laurin Coder, MD;  Location: [facility];  Service: Endoscopy;;   COLONOSCOPY  2009   ESOPHAGOGASTRODUODENOSCOPY     Heel spurs Bilateral    HEMOSTASIS CONTROL  09/12/2019   Procedure: HEMOSTASIS CONTROL;  Surgeon: Laurin Coder, MD;  Location: [facility];  Service: Endoscopy;;  epi   KNEE ARTHROPLASTY Left 11/07/2013   Procedure: COMPUTER ASSISTED TOTAL KNEE ARTHROPLASTY;  Surgeon: Marybelle Killings, MD;  Location: [facility];  Service: Orthopedics;  Laterality: Left;  Left Total Knee Arthroplasty, Cemented, Computer Assist   TONSILLECTOMY     TOTAL KNEE REVISION Left 09/19/2014   Procedure: LEFT TOTAL KNEE  REVISION;  Surgeon: Leandrew Koyanagi, MD;  Location: [facility];  Service: Orthopedics;  Laterality: Left;   UPPER GASTROINTESTINAL ENDOSCOPY     VIDEO BRONCHOSCOPY N/A 09/12/2019   Procedure: VIDEO BRONCHOSCOPY WITH FLUORO;  Surgeon: Laurin Coder, MD;  Location: [facility];  Service: Endoscopy;  Laterality: N/A;     reports that she quit smoking about 42 years ago. Her smoking use included cigarettes. She has a 5.00 pack-year smoking history. She has never used smokeless tobacco. She reports that she does not drink alcohol and does not use drugs.  Allergies  Allergen Reactions   Lamotrigine Swelling    Tongue swelling   Bupropion Other (See Comments)    not known   Carbamazepine Other (See Comments)    Hypnatremia   Levetiracetam     Other reaction(s): Dizziness (intolerance)   Tramadol Other (See Comments)    not known   Adhesive [Tape]     ?  Blister after knee surgery   Amitriptyline     nausea   Clarithromycin     Unsure of reaction     Doxycycline     Interfered with seizure medication   Nickel     Had to remove knee implant with nickel and replace it   Other     Metal and perfumes   Topamax [Topiramate] Nausea And Vomiting   Estradiol Rash   Latex Rash   Phenytoin Sodium Extended Other (See Comments)    drowsiness    Family History  Problem Relation Age of Onset   Aortic dissection Father    Arthritis Mother    Stroke Mother 11   Heart murmur Other    Throat cancer Sister    Colon cancer Neg Hx    Esophageal cancer Neg Hx    Stomach cancer Neg Hx    Rectal cancer Neg Hx    Colon polyps Neg Hx     Prior to Admission medications   Medication Sig Start Date End Date Taking? Authorizing Provider  albuterol (VENTOLIN HFA) 108 (90 Base) MCG/ACT inhaler Inhale 1-2 puffs into the lungs every 6 (six) hours as needed for wheezing or shortness of breath. 05/11/20   Providence Lanius A, PA-C  b complex vitamins tablet Take 1 tablet by mouth daily.    [provider]  beta carotene w/minerals (OCUVITE) tablet Take 1 tablet by mouth daily.    [provider]  Budeson-Glycopyrrol-Formoterol (BREZTRI AEROSPHERE) 160-9-4.8 MCG/ACT AERO Inhale 2 puffs into the lungs 2 (two) times daily. Patient not taking: Reported on 02/13/2021 12/04/20   Caren Macadam, MD  busPIRone (BUSPAR) 10 MG tablet Take 1 tablet (10 mg total) by mouth 2 (two) times daily. 01/23/21   Addison Lank, PA-C  Cholecalciferol (VITAMIN D3 PO) Take by mouth.    [provider]  cyclobenzaprine (FLEXERIL) 10 MG tablet Take 1 tablet (10 mg total) by mouth 2 (  two) times daily as needed for muscle spasms. Patient not taking: Reported on 02/13/2021 06/03/19   Aundra Dubin, PA-C  EPINEPHrine 0.3 mg/0.3 mL IJ SOAJ injection SMARTSIG:Injection As Directed 09/14/19   [provider]  esomeprazole (NEXIUM) 20 MG capsule Take 2 capsules (40 mg total) by mouth daily before breakfast. Patient taking differently: Take 20 mg by mouth daily before breakfast. 03/21/19   Laurey Morale, MD  furosemide (LASIX) 20 MG tablet TAKE 1 TABLET BY MOUTH IN THE MORNING Patient taking differently: Take 20 mg by mouth daily. 01/23/20   Mannam, Hart Robinsons, MD  hyoscyamine (LEVSIN SL) 0.125 MG SL tablet Place 1-2 tablets (0.125-0.25 mg total) under the tongue every 6 (six) hours as needed. 05/15/20   Armbruster, Carlota Raspberry, MD  levocetirizine (XYZAL) 5 MG tablet Take 5 mg by mouth daily.  03/05/16   [provider]  levothyroxine (SYNTHROID) 88 MCG tablet Take 1 tablet (88 mcg total) by mouth daily. 10/11/20   Philemon Kingdom, MD  lisinopril (ZESTRIL) 5 MG tablet TAKE 1 TABLET BY MOUTH EVERY DAY 10/19/20   Koberlein, Steele Berg, MD  LORazepam (ATIVAN) 1 MG tablet Take 1 tablet (1 mg total) by mouth every 6 (six) hours as needed for anxiety. 12/07/20   Donnal Moat T, PA-C  ondansetron (ZOFRAN ODT) 4 MG disintegrating tablet Take 1 tablet (4 mg total) by mouth every 6 (six) hours as  needed for nausea or vomiting. 03/30/19   Caren Macadam, MD  Peppermint Oil (IBGARD) 90 MG CPCR Take as directed Patient not taking: Reported on 02/13/2021 11/30/20   Yetta Flock, MD  pravastatin (PRAVACHOL) 20 MG tablet TAKE 1 TABLET (20 MG TOTAL) BY MOUTH DAILY. Patient taking differently: Take 20 mg by mouth daily. 10/19/20   Caren Macadam, MD  sertraline (ZOLOFT) 100 MG tablet Take 2 tablets (200 mg total) by mouth daily. 09/04/20   Addison Lank, PA-C  Spacer/Aero-Holding Chambers (AEROCHAMBER PLUS) inhaler Use as instructed 11/16/20   Caren Macadam, MD  zonisamide (ZONEGRAN) 100 MG capsule Take 2 capsules twice a day 11/07/20   Cameron Sprang, MD    Physical Exam: Vitals:   02/16/21 2130 02/16/21 2205 02/16/21 2315 02/16/21 2321  BP: (!) 105/50 (!) 102/56 (!) 99/40 (!) 111/51  Pulse: 63 62 60 60  Resp: 20 14 17  (!) 23  Temp:      TempSrc:      SpO2: 99% 99% 99% 100%  Weight:      Height:        Physical Exam Constitutional:      General: She is not in acute distress. HENT:     Head: Normocephalic and atraumatic.  Eyes:     Extraocular Movements: Extraocular movements intact.     Pupils: Pupils are equal, round, and reactive to light.  Cardiovascular:     Rate and Rhythm: Normal rate and regular rhythm.     Pulses: Normal pulses.  Pulmonary:     Effort: Pulmonary effort is normal. No respiratory distress.     Breath sounds: No wheezing.     Comments: Coarse breath sounds at the bases Satting 100% on room air Abdominal:     General: Bowel sounds are normal. There is no distension.     Palpations: Abdomen is soft.     Tenderness: There is no abdominal tenderness.  Musculoskeletal:        General: No swelling or tenderness.     Cervical back: Normal range of  motion and neck supple.  Skin:    General: Skin is warm and dry.  Neurological:     Mental Status: She is alert.     Cranial Nerves: No cranial nerve deficit.     Motor: No weakness.      Comments: Very confused Oriented to self only Moving all extremities on command, no focal weakness     Labs on Admission: I have personally reviewed following labs and imaging studies  CBC: Recent Labs  Lab 02/16/21 1734 02/16/21 1756  WBC 9.9  --   NEUTROABS 7.3  --   HGB 13.2 13.6  HCT 41.9 40.0  MCV 99.5  --   PLT 183  --    Basic Metabolic Panel: Recent Labs  Lab 02/16/21 1734 02/16/21 1756  NA 140 141  K 4.1 4.0  CL 109 108  CO2 19*  --   GLUCOSE 120* 113*  BUN 7* 7*  CREATININE 1.06* 1.00  CALCIUM 8.6*  --    GFR: Estimated Creatinine Clearance: 60 mL/min (by C-G formula based on SCr of 1 mg/dL). Liver Function Tests: Recent Labs  Lab 02/16/21 1734  AST 32  ALT 12  ALKPHOS 81  BILITOT 0.5  PROT 6.6  ALBUMIN 3.4*   No results for input(s): LIPASE, AMYLASE in the last 168 hours. No results for input(s): AMMONIA in the last 168 hours. Coagulation Profile: No results for input(s): INR, PROTIME in the last 168 hours. Cardiac Enzymes: No results for input(s): CKTOTAL, CKMB, CKMBINDEX, TROPONINI in the last 168 hours. BNP (last 3 results) No results for input(s): PROBNP in the last 8760 hours. HbA1C: No results for input(s): HGBA1C in the last 72 hours. CBG: Recent Labs  Lab 02/16/21 1729  GLUCAP 116*   Lipid Profile: No results for input(s): CHOL, HDL, LDLCALC, TRIG, CHOLHDL, LDLDIRECT in the last 72 hours. Thyroid Function Tests: No results for input(s): TSH, T4TOTAL, FREET4, T3FREE, THYROIDAB in the last 72 hours. Anemia Panel: No results for input(s): VITAMINB12, FOLATE, FERRITIN, TIBC, IRON, RETICCTPCT in the last 72 hours. Urine analysis:    Component Value Date/Time   COLORURINE YELLOW 03/30/2020 1311   APPEARANCEUR CLEAR 03/30/2020 1311   LABSPEC 1.004 (L) 03/30/2020 1311   PHURINE 6.0 03/30/2020 1311   GLUCOSEU NEGATIVE 03/30/2020 1311   GLUCOSEU Negative 07/08/2019 1436   HGBUR NEGATIVE 03/30/2020 1311   BILIRUBINUR NEGATIVE  03/30/2020 1311   BILIRUBINUR n 08/14/2014 1154   KETONESUR NEGATIVE 03/30/2020 1311   PROTEINUR NEGATIVE 03/30/2020 1311   UROBILINOGEN 0.2 mg/dL (A) 07/08/2019 1436   NITRITE NEGATIVE 03/30/2020 1311   LEUKOCYTESUR MODERATE (A) 03/30/2020 1311    Radiological Exams on Admission: CT HEAD WO CONTRAST (5MM)  Result Date: 02/16/2021 CLINICAL DATA:  Seizure. EXAM: CT HEAD WITHOUT CONTRAST TECHNIQUE: Contiguous axial images were obtained from the base of the skull through the vertex without intravenous contrast. COMPARISON:  March 03, 2020. FINDINGS: Brain: 20 x 15 mm rounded mass is noted in the left posterior parietal cortex with surrounding white matter edema concerning for malignancy or neoplasm. Ventricular size is within normal limits. No definite midline shift is noted. Small focus of subarachnoid hemorrhage may be present in this area. Vascular: No hyperdense vessel or unexpected calcification. Skull: Normal. Negative for fracture or focal lesion. Sinuses/Orbits: No acute finding. Other: None. IMPRESSION: 2.0 x 1.5 cm rounded mass is noted in the left posterior parietal cortex with surrounding white matter edema concerning for neoplasm or malignancy. MRI with and without gadolinium is recommended  for further evaluation. Also noted is possible small focus of subarachnoid hemorrhage in the left posterior parietal region. Critical Value/emergent results were called by telephone at the time of interpretation on 02/16/2021 at 6:56 pm to provider [facility] , who verbally acknowledged these results. Electronically Signed   By: Marijo Conception M.D.   On: 02/16/2021 18:56   DG Chest Portable 1 View  Result Date: 02/16/2021 CLINICAL DATA:  Hypoxia and seizure EXAM: PORTABLE CHEST 1 VIEW COMPARISON:  11/21/2020 FINDINGS: The cardiomediastinal silhouette is unchanged. Increasing interstitial and airspace opacities bilaterally are noted. Elevated RIGHT hemidiaphragm is again noted. No  pneumothorax or large pleural effusion. IMPRESSION: Increasing interstitial and airspace opacities bilaterally which may represent edema versus infection/pneumonia. Electronically Signed   By: Margarette Canada M.D.   On: 02/16/2021 18:19    EKG: Independently reviewed. Atrial fibrillation/flutter, rate controlled.  Assessment/Plan Principal Problem:   Seizures ([place]) Active Problems:   Brain mass   Subarachnoid hemorrhage ([facility])   Acute hypoxemic respiratory failure ([facility])   Atrial flutter ([facility])   History of seizure disorder with breakthrough seizures in the setting of new brain mass with possible associated small focus of subarachnoid hemorrhage Patient with history of seizure disorder on zonisamide 100 mg twice daily had 3 seizures today.  History of Keppra intolerance in the past and allergies to multiple antiepileptic drugs. CT head without contrast showing 2 x 1.5 cm rounded mass in the left posterior parietal cortex with surrounding white matter edema concerning for neoplasm or malignancy.  Also showing possible small focus of subarachnoid hemorrhage in the left posterior parietal region.  Patient is currently somnolent but arousable.  Very confused, oriented to self only.  Moving all extremities on command, no focal weakness. -Neurology saw the patient.  Loaded with IV Keppra 2 g, recommending starting IV Keppra 500 mg twice daily.  When able to tolerate p.o. intake, start zonisamide 150 mg twice daily.  Recommending overlapping Keppra and zonisamide for 2 doses and then stopping Keppra and continuing only zonisamide.  Since patient is not actively seizing, no plan for continuous EEG at this time.  Recommending obtaining routine EEG in the morning. -Neurosurgery consulted - I spoke to on-call provider for neurosurgery (Dr. Ronnald Ramp), she recommends not starting steroids at this time until brain MRI is done. -Stat brain MRI with and without contrast ordered for further evaluation -Seizure  precautions  Hypoxia Became apneic with EMS when she had a seizure and required BVM, then nonrebreather.  In the ED, desatted to the 49s when she had a seizure.  Chest x-ray showing increasing interstitial and airspace opacities bilaterally which may represent edema versus infection/pneumonia. ?Aspiration.  No fever or leukocytosis.  Currently satting 100% on room air, no respiratory distress.  Echo done June 2021 showing LVEF 60 to 65% and grade 1 diastolic dysfunction. -Continuous pulse ox, supplemental oxygen as needed.  Check procalcitonin and BNP.  New onset atrial fibrillation/flutter EKG concerning for new onset A. fib/flutter.  Patient will not leave cardiac monitor leads on.  Heart rate has been normal.  CHA2DS2-VASc at least 3, anticoagulation currently contraindicated given possible subarachnoid hemorrhage on CT. -Monitor closely.  Check TSH and mag levels.  Normal anion gap metabolic acidosis Likely due to home diuretic use. -Continue to monitor  Anxiety, depression GERD Hypertension: Blood pressure currently stable. Gout Hyperlipidemia Hypothyroidism -Pharmacy med rec pending  DVT prophylaxis: SCDs Code Status: Full code Family Communication: No family available at this time. Disposition Plan: Status is: Inpatient  Remains inpatient appropriate because: Seizure x3  Level of care: Level of care: Progressive  The medical decision making on this patient was of high complexity and the patient is at high risk for clinical deterioration, therefore this is a level 3 visit.  Shela Leff MD [facility]  If 7PM-7AM, please contact night-coverage [email or web address]  02/17/2021, 12:14 AM

## 2021-02-17 ENCOUNTER — Inpatient Hospital Stay (HOSPITAL_COMMUNITY): Payer: Medicare HMO

## 2021-02-17 DIAGNOSIS — J9601 Acute respiratory failure with hypoxia: Secondary | ICD-10-CM

## 2021-02-17 DIAGNOSIS — C719 Malignant neoplasm of brain, unspecified: Secondary | ICD-10-CM

## 2021-02-17 DIAGNOSIS — I609 Nontraumatic subarachnoid hemorrhage, unspecified: Secondary | ICD-10-CM

## 2021-02-17 DIAGNOSIS — G9389 Other specified disorders of brain: Secondary | ICD-10-CM

## 2021-02-17 DIAGNOSIS — R569 Unspecified convulsions: Secondary | ICD-10-CM | POA: Diagnosis not present

## 2021-02-17 DIAGNOSIS — I4892 Unspecified atrial flutter: Secondary | ICD-10-CM

## 2021-02-17 LAB — BRAIN NATRIURETIC PEPTIDE: B Natriuretic Peptide: 203 pg/mL — ABNORMAL HIGH (ref 0.0–100.0)

## 2021-02-17 LAB — BASIC METABOLIC PANEL
Anion gap: 6 (ref 5–15)
BUN: 8 mg/dL (ref 8–23)
CO2: 22 mmol/L (ref 22–32)
Calcium: 8.6 mg/dL — ABNORMAL LOW (ref 8.9–10.3)
Chloride: 110 mmol/L (ref 98–111)
Creatinine, Ser: 0.89 mg/dL (ref 0.44–1.00)
GFR, Estimated: >60 mL/min (ref >60)
Glucose, Bld: 102 mg/dL — ABNORMAL HIGH (ref 70–99)
Potassium: 3.9 mmol/L (ref 3.5–5.1)
Sodium: 138 mmol/L (ref 135–145)

## 2021-02-17 LAB — MAGNESIUM: Magnesium: 2.3 mg/dL (ref 1.7–2.4)

## 2021-02-17 LAB — PROCALCITONIN: Procalcitonin: <0.1 ng/mL

## 2021-02-17 MED ORDER — ZONISAMIDE 25 MG PO CAPS
150.0000 mg | ORAL_CAPSULE | Freq: Two times a day (BID) | ORAL | Status: DC
  Administered 2021-02-17 – 2021-02-18 (×2): 150 mg via ORAL
  Filled 2021-02-17 (×4): qty 2

## 2021-02-17 MED ORDER — LEVETIRACETAM IN NACL 500 MG/100ML IV SOLN
500.0000 mg | Freq: Two times a day (BID) | INTRAVENOUS | Status: AC
Start: 2021-02-17 — End: 2021-02-17
  Administered 2021-02-17 (×2): 500 mg via INTRAVENOUS
  Filled 2021-02-17 (×2): qty 100

## 2021-02-17 MED ORDER — SODIUM CHLORIDE 0.9 % IV SOLN: INTRAVENOUS | Status: DC

## 2021-02-17 MED ORDER — SODIUM CHLORIDE 0.9 % IV SOLN: INTRAVENOUS | Status: AC

## 2021-02-17 MED ORDER — ACETAMINOPHEN 650 MG RE SUPP: 650.0000 mg | Freq: Four times a day (QID) | RECTAL | Status: DC | PRN

## 2021-02-17 MED ORDER — GADOBUTROL 1 MMOL/ML IV SOLN
10.0000 mL | Freq: Once | INTRAVENOUS | Status: AC | PRN
  Administered 2021-02-17: 10 mL via INTRAVENOUS

## 2021-02-17 MED ORDER — ACETAMINOPHEN 325 MG PO TABS: 650.0000 mg | ORAL_TABLET | Freq: Four times a day (QID) | ORAL | Status: DC | PRN

## 2021-02-17 MED ORDER — DEXAMETHASONE SODIUM PHOSPHATE 4 MG/ML IJ SOLN
4.0000 mg | Freq: Four times a day (QID) | INTRAMUSCULAR | Status: DC
  Administered 2021-02-17 – 2021-02-18 (×3): 4 mg via INTRAVENOUS
  Filled 2021-02-17 (×3): qty 1

## 2021-02-17 MED ORDER — FAMOTIDINE IN NACL 20-0.9 MG/50ML-% IV SOLN
20.0000 mg | Freq: Two times a day (BID) | INTRAVENOUS | Status: DC
  Administered 2021-02-17 – 2021-02-18 (×3): 20 mg via INTRAVENOUS
  Filled 2021-02-17 (×3): qty 50

## 2021-02-17 MED ORDER — LORAZEPAM 2 MG/ML IJ SOLN
0.5000 mg | Freq: Once | INTRAMUSCULAR | Status: AC
Start: 2021-02-17 — End: 2021-02-17
  Administered 2021-02-17: 0.5 mg via INTRAVENOUS
  Filled 2021-02-17: qty 1

## 2021-02-17 NOTE — Progress Notes (Signed)
NEUROLOGY CONSULTATION PROGRESS NOTE   Date of service: February 17, 2021 Patient Name: Beverly Ballard MRN:  [id number] DOB:  04-Sep-1945  Brief HPI   CEYDA PETERKA is a 75 y.o. female with PMH significant for anxiety, depression, constipation, hypertension, hypothyroidism, sleep apnea, seizures on zonisamide 100 mg twice daily who presented with 3 seizures. Lethargic and feels a little foggy after seizures but now pretty much back to her baseline.  Interval Hx   CTH and MRI Brain with a enhancing parietal lobe lesion with extensive edema.  Vitals   Vitals:   02/17/21 1100 02/17/21 1215 02/17/21 1230 02/17/21 1245  BP: (!) 100/42 (!) 108/51 (!) 94/46 (!) 112/48  Pulse: (!) 57 61 63 61  Resp: 15 19 15 15   Temp:      TempSrc:      SpO2: 93% 100% 97% 95%  Weight:      Height:         Body mass index is 38.19 kg/m.  Physical Exam   General: Laying comfortably in bed; in no acute distress.  HENT: Normal oropharynx and mucosa. Normal external appearance of ears and nose.  Neck: Supple, no pain or tenderness  CV: No JVD. No peripheral edema.  Pulmonary: Symmetric Chest rise. Normal respiratory effort.  Abdomen: Soft to touch, non-tender.  Ext: No cyanosis, edema, or deformity  Skin: No rash. Normal palpation of skin.   Musculoskeletal: Normal digits and nails by inspection. No clubbing.   Neurologic Examination  Mental status/Cognition: somnolent and feels foggy but opens eyes to voice, oriented to self, place, month and year, good attention.  Speech/language: Bradyphrenic, fluent, comprehension intact, object naming intact, repetition intact. Cranial nerves:   CN II Pupils equal and reactive to light, no VF deficits   CN III,IV,VI EOM intact, no gaze preference or deviation, no nystagmus    CN V normal sensation in V1, V2, and V3 segments bilaterally    CN VII no asymmetry, no nasolabial fold flattening    CN VIII normal hearing to speech    CN IX & X normal palatal  elevation, no uvular deviation    CN XI 5/5 head turn and 5/5 shoulder shrug bilaterally    CN XII midline tongue protrusion   Motor:  Muscle bulk: poor, tone normal, pronator drift none tremor none Mvmt Root Nerve  Muscle Right Left Comments  SA C5/6 Ax Deltoid 4 4 Entire body hurts limiting full strength testing.  EF C5/6 Mc Biceps 4 4   EE C6/7/8 Rad Triceps 4 4   WF C6/7 Med FCR     WE C7/8 PIN ECU     F Ab C8/T1 U ADM/FDI 4 4   HF L1/2/3 Fem Illopsoas 4 4   KE L2/3/4 Fem Quad     DF L4/5 D Peron Tib Ant 4 4   PF S1/2 Tibial Grc/Sol 4 4    Reflexes:  Right Left Comments  Pectoralis      Biceps (C5/6) 2 2   Brachioradialis (C5/6) 2 2    Triceps (C6/7) 2 2    Patellar (L3/4) 1 1    Achilles (S1) 0 0    Hoffman      Plantar     Jaw jerk    Sensation:  Light touch intact   Pin prick    Temperature    Vibration   Proprioception    Coordination/Complex Motor:  - Finger to Nose intact BL - Heel to shin unable to do due  to pain - Rapid alternating movement are slowed. - Gait: unsafe to assess given pain and body habitus.  Labs   Basic Metabolic Panel:  Lab Results  Component Value Date   NA 138 02/17/2021   K 3.9 02/17/2021   CO2 22 02/17/2021   GLUCOSE 102 (H) 02/17/2021   BUN 8 02/17/2021   CREATININE 0.89 02/17/2021   CALCIUM 8.6 (L) 02/17/2021   GFRNONAA >60 02/17/2021   GFRAA >60 12/20/2019   HbA1c:  Lab Results  Component Value Date   HGBA1C 5.7 06/24/2019   LDL:  Lab Results  Component Value Date   LDLCALC 100 (H) 03/19/2020   Urine Drug Screen: No results found for: LABOPIA, COCAINSCRNUR, LABBENZ, AMPHETMU, THCU, LABBARB  Alcohol Level No results found for: ETH No results found for: PHENYTOIN, ZONISAMIDE, LAMOTRIGINE, LEVETIRACETA No results found for: PHENYTOIN, PHENOBARB, VALPROATE, CBMZ  Imaging and Diagnostic studies   MRI Brain with and without contrast: 1. 2.0 x 2.2 x 1.6 cm heterogeneously enhancing mass lesion in the left  parietal lobe with surrounding edema and local mass effect. Differential diagnosis includes high-grade primary CNS neoplasm versus metastatic disease. At least 2 adjacent lesions are present. 2. Marked surrounding vasogenic edema with mass effect as described. No midline shift. 3. Mild sinus disease as described.  Impression   Beverly Ballard is a 75 y.o. female with PMH significant for anxiety, depression, constipation, hypertension, hypothyroidism, sleep apnea, seizures on zonisamide 100 mg twice daily who presented with 3 seizures. Found to have a enhancing lesion in left parietal lobe concerning for malignancy. She feels groggy, lethargic with myalgias but is otherwise pretty much back to her baseline.  She has had intolerance to Keppra in the past but seems to have allergies to multiple AEDs.  At this time I will start her on Keppra with plan to transition her to zonisamide when she is able to tolerate p.o. intake.  Recommendations  - Keppra 2 g IV once given in the emergency department. - Can start Keppra 500mg  BID IV - When she can tolerate PO intake, would recommend starting Zonisamide 150mg  BID. Would recommend overlapping Keppra and Zonisamide for 2 doses and then stop Keppra and continue only zonisamide. - Agree with steroids per Neurosurgery team. - We will signoff. Please feel free to contact us with any questions or concerns. ______________________________________________________________________   Plan discussed with Dr. Maren Beach with the Hospitalist team.  Thank you for the opportunity to take part in the care of this patient. If you have any further questions, please contact the neurology consultation attending.  Signed,  [place] Pager Number [phone]

## 2021-02-17 NOTE — Progress Notes (Addendum)
Pt brought to MRI for exam via pt transport. Upon attempting to get pt prepared for exam, pt began yelling and saying, I don't know where I am, I don't like this. Pt began moving head and arms around . RN made aware, meds to be ordered per RN. Unable to safely scan pt in current condition. Sent back via pt transport.

## 2021-02-17 NOTE — Progress Notes (Signed)
PROGRESS NOTE    TYMESHA DITMORE  RXV:[id number] DOB: November 21, 1945 DOA: 02/16/2021 PCP: Caren Macadam, MD   Chief Complaint  Patient presents with   Seizures   Brief Narrative/Hospital Course:  Leanor Kail, 75 y.o. female with PMH of of seizure disorder on Jenise Amite, migraine anxiety/depression, vaginal cancer, arthritis chronic hypersensitivity pneumonitis, GERD, HTN, gout, HLD, hypothyroidism, OSA seen by PCP 5 days PTA for increased somnolence, dizziness and brought to the ED from home with witnessed seizure-unknown duration again had seizure lasting 3 minutes in route with EMS, became apneic and cyanotic needing BVM. In the ED postictal but again her GCS lasting several minutes hypoxic in 50s during the seizure seen by neurology loaded with Keppra and routine labs imaging obtained that showed CT : 2.0 x 1.5 cm rounded mass is noted in the left posterior parietal cortex with surrounding white matter edema concerning for neoplasm or malignancy.  Chest x-ray increasing interstitial and airspace opacities edema versus infection. Dr. Carson Myrtle from PCCM saw the patient and felt that she was stable for progressive care unit. Patient somnolent but arousable on admission confused.  Admitted for further management  Subjective: Seen in ED He is alert awake oriented to self current date of birth current president month year but not to place Denies any complaint.  Does not appear agitated This morning was screaming in MRI unable to complete it- ativan requested.  Assessment & Plan:  Breakthrough  seizures x 3 History of seizure disorder Brain mass: Concerning for malignancy: Breakthrough seizure in the setting of brain mass. CT head with brain mass with surrounding white matter edema, possible small focus of SAH.  She is alert awake oriented but confused, able to move her extremities on command.  Continue with Keppra, home zonisamide, continue aspiration precaution, seizure precaution.   Neurology following neurosurgery was consulted Dr. Ronnald Ramp -waiting for further recommendation , MRI brain with and without contrast pending, EEG pending, ordered Ativan for MRI.  Hypoxic event during seizure and apneic with EMS during seizure: Chest x-ray interstitial and airspace opacities question aspiration, recent echo in 2021 with G1 DD normal EF.  BNP 203, procalcitonin normal, we will not start antibiotic, monitor pulse ox.  New onset A. fib/flutter in EKG. rate appears stable, follow TSH mag level unable to anticoagulate due to brain mass SAH on CT.  May need cardiology consult  Nongap metabolic acidosis holding diuretics likely from seizure monitor  Anxiety/depression/migraine: On BuSpar, Zoloft at home resume slowly GERD-add PPI Hypertension-BP is controlled.  Holding lisinopril and Lasix HLD on pravastatin at home. Hypothyroidism-resume Synthroid once able to take p.o.  Class II Obesity:Patient's Body mass index is 38.19 kg/m. : Will benefit with PCP follow-up, weight loss  healthy lifestyle and outpatient sleep evaluation.  DVT prophylaxis: SCDs Start: 02/17/21 0012 Code Status:   Code Status: Full Code Family Communication: plan of care discussed with patient  at bedside. Status is: Inpatient  Remains inpatient appropriate because: For ongoing monitoring of seizure disorder and work-up of brain mass   Objective: Vitals last 24 hrs: Vitals:   02/17/21 0200 02/17/21 0300 02/17/21 0500 02/17/21 0600  BP: 120/64 (!) 103/47 (!) 119/58 (!) 102/49  Pulse: (!) 53 63 (!) 57 (!) 58  Resp: 20 18 16 18   Temp:      TempSrc:      SpO2: 98% 96% 98% 95%  Weight:      Height:       Weight change:   Intake/Output Summary (Last 24  hours) at 02/17/2021 1011 Last data filed at 02/16/2021 1853 Gross per 24 hour  Intake 185.25 ml  Output --  Net 185.25 ml   Net IO Since Admission: 185.25 mL [02/17/21 1011]   Physical Examination: General exam: AA0X2-3, weak,older than stated  age. HEENT:Oral mucosa moist, Ear/Nose WNL grossly,dentition normal. Respiratory system: B/l basal crackles, no use of accessory muscle, non tender. Cardiovascular system: S1 & S2 +,No JVD. Gastrointestinal system: Abdomen soft, NT,ND, BS+. Nervous System:Alert, awake, moving extremities. Extremities: edema none, distal peripheral pulses palpable.  Skin: No rashes, no icterus. MSK: Normal muscle bulk, tone, power.  Medications reviewed:  Scheduled Meds:  LORazepam  0.5 mg Intravenous Once   zonisamide  150 mg Oral BID   Continuous Infusions:  levETIRAcetam 500 mg (02/17/21 0940)   Diet Order     None       Weight change:   Wt Readings from Last 3 Encounters:  02/16/21 104.1 kg  02/11/21 104.1 kg  01/08/21 104.4 kg     Consultants:see note  Procedures:see note Antimicrobials: Anti-infectives (From admission, onward)    None      Culture/Microbiology    Component Value Date/Time   SDES  09/12/2019 0851    BRONCHIAL ALVEOLAR LAVAGE RML Performed at [facility], [place] [address] [place], [place] [address]    SPECREQUEST  09/12/2019 3664    NONE Performed at [facility], [place] [address] [place], [place] [address]    CULT  09/12/2019 [phone]    NO GROWTH 2 DAYS Performed at [facility], [place] [address] [place], [place] [address]    REPTSTATUS 09/14/2019 FINAL 09/12/2019 5643    Other culture-see note  Unresulted Labs (From admission, onward)     Start     Ordered   02/18/21 3295  Basic metabolic panel  Tomorrow morning,   R        02/17/21 1011   02/18/21 0500  CBC  Tomorrow morning,   R        02/17/21 1011   02/16/21 1750  TSH  Add-on,   AD        02/16/21 1749   02/16/21 1735  Urinalysis, Routine w reflex microscopic  Once,   STAT        02/16/21 1736   02/16/21 1735  Urine Culture  Once,   STAT       Question:  Indication  Answer:  Dysuria   02/16/21 1736           Data Reviewed: I have personally  reviewed following labs and imaging studies CBC: Recent Labs  Lab 02/16/21 1734 02/16/21 1756  WBC 9.9  --   NEUTROABS 7.3  --   HGB 13.2 13.6  HCT 41.9 40.0  MCV 99.5  --   PLT 183  --    Basic Metabolic Panel: Recent Labs  Lab 02/16/21 1734 02/16/21 1756 02/17/21 0639  NA 140 141 138  K 4.1 4.0 3.9  CL 109 108 110  CO2 19*  --  22  GLUCOSE 120* 113* 102*  BUN 7* 7* 8  CREATININE 1.06* 1.00 0.89  CALCIUM 8.6*  --  8.6*  MG 2.3  --   --    GFR: Estimated Creatinine Clearance: 67.4 mL/min (by C-G formula based on SCr of 0.89 mg/dL). Liver Function Tests: Recent Labs  Lab 02/16/21 1734  AST 32  ALT 12  ALKPHOS 81  BILITOT 0.5  PROT 6.6  ALBUMIN 3.4*  No results for input(s): LIPASE, AMYLASE in the last 168 hours. No results for input(s): AMMONIA in the last 168 hours. Coagulation Profile: No results for input(s): INR, PROTIME in the last 168 hours. Cardiac Enzymes: No results for input(s): CKTOTAL, CKMB, CKMBINDEX, TROPONINI in the last 168 hours. BNP (last 3 results) No results for input(s): PROBNP in the last 8760 hours. HbA1C: No results for input(s): HGBA1C in the last 72 hours. CBG: Recent Labs  Lab 02/16/21 1729  GLUCAP 116*   Lipid Profile: No results for input(s): CHOL, HDL, LDLCALC, TRIG, CHOLHDL, LDLDIRECT in the last 72 hours. Thyroid Function Tests: No results for input(s): TSH, T4TOTAL, FREET4, T3FREE, THYROIDAB in the last 72 hours. Anemia Panel: No results for input(s): VITAMINB12, FOLATE, FERRITIN, TIBC, IRON, RETICCTPCT in the last 72 hours. Sepsis Labs: Recent Labs  Lab 02/16/21 [place] <0.10    Recent Results (from the past 240 hour(s))  Resp Panel by RT-PCR (Flu A&B, Covid) Nasopharyngeal Swab     Status: None   Collection Time: 02/16/21  5:45 PM   Specimen: Nasopharyngeal Swab; Nasopharyngeal(NP) swabs in vial transport medium  Result Value Ref Range Status   SARS Coronavirus 2 by RT PCR NEGATIVE NEGATIVE Final     Comment: (NOTE) SARS-CoV-2 target nucleic acids are NOT DETECTED.  The SARS-CoV-2 RNA is generally detectable in upper respiratory specimens during the acute phase of infection. The lowest concentration of SARS-CoV-2 viral copies this assay can detect is 138 copies/mL. A negative result does not preclude SARS-Cov-2 infection and should not be used as the sole basis for treatment or other patient management decisions. A negative result may occur with  improper specimen collection/handling, submission of specimen other than nasopharyngeal swab, presence of viral mutation(s) within the areas targeted by this assay, and inadequate number of viral copies(<138 copies/mL). A negative result must be combined with clinical observations, patient history, and epidemiological information. The expected result is Negative.  Fact Sheet for Patients:  EntrepreneurPulse.com.au  Fact Sheet for Healthcare Providers:  IncredibleEmployment.be  This test is no t yet approved or cleared by the [place] FDA and  has been authorized for detection and/or diagnosis of SARS-CoV-2 by FDA under an Emergency Use Authorization (EUA). This EUA will remain  in effect (meaning this test can be used) for the duration of the COVID-19 declaration under Section 564(b)(1) of the Act, 21 U.S.C.section 360bbb-3(b)(1), unless the authorization is terminated  or revoked sooner.       Influenza A by PCR NEGATIVE NEGATIVE Final   Influenza B by PCR NEGATIVE NEGATIVE Final    Comment: (NOTE) The Xpert Xpress SARS-CoV-2/FLU/RSV plus assay is intended as an aid in the diagnosis of influenza from Nasopharyngeal swab specimens and should not be used as a sole basis for treatment. Nasal washings and aspirates are unacceptable for Xpert Xpress SARS-CoV-2/FLU/RSV testing.  Fact Sheet for Patients: EntrepreneurPulse.com.au  Fact Sheet for Healthcare  Providers: IncredibleEmployment.be  This test is not yet approved or cleared by the [place] FDA and has been authorized for detection and/or diagnosis of SARS-CoV-2 by FDA under an Emergency Use Authorization (EUA). This EUA will remain in effect (meaning this test can be used) for the duration of the COVID-19 declaration under Section 564(b)(1) of the Act, 21 U.S.C. section 360bbb-3(b)(1), unless the authorization is terminated or revoked.  Performed at [facility], [place] [address] [place], [place] [address]      Radiology Studies: CT HEAD WO CONTRAST (5MM)  Result Date: 02/16/2021  CLINICAL DATA:  Seizure. EXAM: CT HEAD WITHOUT CONTRAST TECHNIQUE: Contiguous axial images were obtained from the base of the skull through the vertex without intravenous contrast. COMPARISON:  March 03, 2020. FINDINGS: Brain: 20 x 15 mm rounded mass is noted in the left posterior parietal cortex with surrounding white matter edema concerning for malignancy or neoplasm. Ventricular size is within normal limits. No definite midline shift is noted. Small focus of subarachnoid hemorrhage may be present in this area. Vascular: No hyperdense vessel or unexpected calcification. Skull: Normal. Negative for fracture or focal lesion. Sinuses/Orbits: No acute finding. Other: None. IMPRESSION: 2.0 x 1.5 cm rounded mass is noted in the left posterior parietal cortex with surrounding white matter edema concerning for neoplasm or malignancy. MRI with and without gadolinium is recommended for further evaluation. Also noted is possible small focus of subarachnoid hemorrhage in the left posterior parietal region. Critical Value/emergent results were called by telephone at the time of interpretation on 02/16/2021 at 6:56 pm to provider [facility] , who verbally acknowledged these results. Electronically Signed   By: Marijo Conception M.D.   On: 02/16/2021 18:56   DG Chest Portable 1  View  Result Date: 02/16/2021 CLINICAL DATA:  Hypoxia and seizure EXAM: PORTABLE CHEST 1 VIEW COMPARISON:  11/21/2020 FINDINGS: The cardiomediastinal silhouette is unchanged. Increasing interstitial and airspace opacities bilaterally are noted. Elevated RIGHT hemidiaphragm is again noted. No pneumothorax or large pleural effusion. IMPRESSION: Increasing interstitial and airspace opacities bilaterally which may represent edema versus infection/pneumonia. Electronically Signed   By: Margarette Canada M.D.   On: 02/16/2021 18:19     LOS: 1 day   Antonieta Pert, MD [facility]  02/17/2021, 10:11 AM

## 2021-02-17 NOTE — ED Notes (Signed)
Pt extremely confused and emotional. Keeps asking same questions over again and crying. Pt stating"nobody loves me anymore"

## 2021-02-17 NOTE — Progress Notes (Signed)
I reviewed the MRI independently and reviewed the report.  There is a left posterior parietal enhancing lesion with surrounding edema consistent with either primary brain tumor such as GBM, versus metastatic lesion versus infection versus other.  I suspect this is a primary brain tumor, but I would proceed with work-up to rule out a source of metastatic disease.  Would recommend CT scan of the chest abdomen pelvis.  Continue Decadron  Continue seizure prophylaxis  We are following.  May need surgical resection or biopsy of the lesion this week

## 2021-02-17 NOTE — ED Notes (Signed)
Amaani Guilbault husband [phone] requesting an update

## 2021-02-18 ENCOUNTER — Inpatient Hospital Stay (HOSPITAL_COMMUNITY): Payer: Medicare HMO

## 2021-02-18 DIAGNOSIS — J8410 Pulmonary fibrosis, unspecified: Secondary | ICD-10-CM | POA: Diagnosis not present

## 2021-02-18 DIAGNOSIS — I4892 Unspecified atrial flutter: Secondary | ICD-10-CM | POA: Diagnosis not present

## 2021-02-18 DIAGNOSIS — I700 Atherosclerosis of aorta: Secondary | ICD-10-CM | POA: Diagnosis not present

## 2021-02-18 DIAGNOSIS — G9389 Other specified disorders of brain: Secondary | ICD-10-CM | POA: Diagnosis not present

## 2021-02-18 DIAGNOSIS — R569 Unspecified convulsions: Secondary | ICD-10-CM | POA: Diagnosis not present

## 2021-02-18 DIAGNOSIS — G936 Cerebral edema: Secondary | ICD-10-CM | POA: Diagnosis not present

## 2021-02-18 DIAGNOSIS — I609 Nontraumatic subarachnoid hemorrhage, unspecified: Secondary | ICD-10-CM | POA: Diagnosis not present

## 2021-02-18 DIAGNOSIS — Z8544 Personal history of malignant neoplasm of other female genital organs: Secondary | ICD-10-CM | POA: Diagnosis not present

## 2021-02-18 DIAGNOSIS — I2510 Atherosclerotic heart disease of native coronary artery without angina pectoris: Secondary | ICD-10-CM | POA: Diagnosis not present

## 2021-02-18 DIAGNOSIS — Z981 Arthrodesis status: Secondary | ICD-10-CM | POA: Diagnosis not present

## 2021-02-18 LAB — CBC
HCT: 37.9 % (ref 36.0–46.0)
Hemoglobin: 12.2 g/dL (ref 12.0–15.0)
MCH: 31.3 pg (ref 26.0–34.0)
MCHC: 32.2 g/dL (ref 30.0–36.0)
MCV: 97.2 fL (ref 80.0–100.0)
Platelets: 178 10*3/uL (ref 150–400)
RBC: 3.9 MIL/uL (ref 3.87–5.11)
RDW: 13.2 % (ref 11.5–15.5)
WBC: 8 10*3/uL (ref 4.0–10.5)
nRBC: 0 % (ref 0.0–0.2)

## 2021-02-18 LAB — CBG MONITORING, ED: Glucose-Capillary: 115 mg/dL — ABNORMAL HIGH (ref 70–99)

## 2021-02-18 LAB — BASIC METABOLIC PANEL
Anion gap: 4 — ABNORMAL LOW (ref 5–15)
BUN: 10 mg/dL (ref 8–23)
CO2: 23 mmol/L (ref 22–32)
Calcium: 8.8 mg/dL — ABNORMAL LOW (ref 8.9–10.3)
Chloride: 112 mmol/L — ABNORMAL HIGH (ref 98–111)
Creatinine, Ser: 0.85 mg/dL (ref 0.44–1.00)
GFR, Estimated: >60 mL/min (ref >60)
Glucose, Bld: 105 mg/dL — ABNORMAL HIGH (ref 70–99)
Potassium: 4.2 mmol/L (ref 3.5–5.1)
Sodium: 139 mmol/L (ref 135–145)

## 2021-02-18 LAB — URINALYSIS, ROUTINE W REFLEX MICROSCOPIC
Bilirubin Urine: NEGATIVE
Glucose, UA: NEGATIVE mg/dL
Hgb urine dipstick: NEGATIVE
Ketones, ur: 5 mg/dL — AB
Nitrite: NEGATIVE
Protein, ur: NEGATIVE mg/dL
Specific Gravity, Urine: 1.024 (ref 1.005–1.030)
pH: 5 (ref 5.0–8.0)

## 2021-02-18 MED ORDER — DEXAMETHASONE 4 MG PO TABS: 4.0000 mg | ORAL_TABLET | Freq: Four times a day (QID) | ORAL | 0 refills | Status: DC

## 2021-02-18 MED ORDER — GADOBUTROL 1 MMOL/ML IV SOLN
10.0000 mL | Freq: Once | INTRAVENOUS | Status: AC | PRN
  Administered 2021-02-18: 10 mL via INTRAVENOUS

## 2021-02-18 MED ORDER — IOHEXOL 350 MG/ML SOLN
100.0000 mL | Freq: Once | INTRAVENOUS | Status: AC | PRN
  Administered 2021-02-18: 100 mL via INTRAVENOUS

## 2021-02-18 NOTE — ED Notes (Signed)
Attempted report 

## 2021-02-18 NOTE — ED Notes (Signed)
Pt transported to CT ?

## 2021-02-18 NOTE — Progress Notes (Signed)
EEG complete - results pending 

## 2021-02-18 NOTE — ED Notes (Signed)
Pt back from MRI 

## 2021-02-18 NOTE — Discharge Summary (Signed)
Physician Discharge Summary  Beverly Ballard TFT:[id number] DOB: [birth year] DOA: 02/16/2021  PCP: Caren Macadam, MD  Admit date: 02/16/2021 Discharge date: 02/18/2021  Time spent: 60 minutes  Recommendations for Outpatient Follow-up:  Follow-up neurosurgery as outpatient Surgery planned for Friday February 22, 2021   Discharge Diagnoses:  Principal Problem:   Seizures ([place]) Active Problems:   Brain mass   Subarachnoid hemorrhage ([place])   Acute hypoxemic respiratory failure ([place])   Atrial flutter ([place])   Discharge Condition: Stable  Diet recommendation: Heart healthy diet  Filed Weights   02/16/21 1728  Weight: 104.1 kg    History of present illness:  75 y.o. female with PMH of of seizure disorder on Jenise Amite, migraine anxiety/depression, vaginal cancer, arthritis chronic hypersensitivity pneumonitis, GERD, HTN, gout, HLD, hypothyroidism, OSA seen by PCP 5 days PTA for increased somnolence, dizziness and brought to the ED from home with witnessed seizure-unknown duration again had seizure lasting 3 minutes in route with EMS, became apneic and cyanotic needing BVM. In the ED postictal but again her GCS lasting several minutes hypoxic in 50s during the seizure seen by neurology loaded with Keppra and routine labs imaging obtained that showed CT : 2.0 x 1.5 cm rounded mass is noted in the left posterior parietal cortex with surrounding white matter edema concerning for neoplasm or malignancy.  Chest x-ray increasing interstitial and airspace opacities edema versus infection. Dr. Carson Myrtle from PCCM saw the patient and felt that she was stable for progressive care unit. Patient somnolent but arousable on admission confused.  Admitted for further management  Hospital Course:   Seizure/brain mass -Patient had seizure in the setting of brain mass -CT head confirmed brain mass with surrounding white matter edema possible small focus of SAH. -She was started on Keppra, home  zonisamide -Neurology was consulted, they recommended to continue with Keppra for 2 doses and overlapping with zonisamide -At this time she has been started on Decadron 4 mg 4 times a day and surgery planned for Friday, November 4. -We will continue with Decadron and is on acetamide -Patient got an MRI before discharge -No more seizures in the hospital  New onset atrial flutter/fibrillation -No anticoagulation due to brain mass with SAH on CT -Will need anticoagulation after surgery based on neurosurgery discretion  Anxiety/depression -Continue BuSpar, Zoloft  Hypertension -Continue lisinopril, Lasix  Hypothyroidism -Continue Synthroid  Procedures: EEG  Consultations: Neurosurgery Neurology  Discharge Exam: Vitals:   02/18/21 1330 02/18/21 1715  BP: (!) 113/55 (!) 120/58  Pulse: (!) 58 (!) 59  Resp: 16 17  Temp:    SpO2: 99% 100%    General: Appears in no acute distress Cardiovascular: S1-S2, regular, no murmur auscultated Respiratory: Clear to auscultation bilaterally  Discharge Instructions   Discharge Instructions     Diet - low sodium heart healthy   Complete by: As directed    Increase activity slowly   Complete by: As directed       Allergies as of 02/18/2021       Reactions   Lamotrigine Swelling   Tongue swelling   Bupropion Other (See Comments)   Unknown reaction   Carbamazepine Other (See Comments)   Hypnatremia   Levetiracetam Other (See Comments)   dizziness   Tramadol Other (See Comments)   Unknown reaction   Adhesive [tape] Other (See Comments)   ?  Blister after knee surgery   Amitriptyline Nausea And Vomiting   Clarithromycin Other (See Comments)   Unknown reaction   Doxycycline Other (  See Comments)   Interfered with seizure medication   Nickel Other (See Comments)   Had to remove knee implant with nickel and replace it   Other Other (See Comments)   Allergic to Metal and perfumes (unknown reaction)   Topamax [topiramate]  Nausea And Vomiting   Estradiol Rash   Latex Rash   Phenytoin Sodium Extended Other (See Comments)   drowsiness        Medication List     STOP taking these medications    cyclobenzaprine 10 MG tablet Commonly known as: FLEXERIL   ibuprofen 200 MG tablet Commonly known as: ADVIL   LORazepam 1 MG tablet Commonly known as: Ativan       TAKE these medications    AeroChamber Plus inhaler Use as instructed   albuterol 108 (90 Base) MCG/ACT inhaler Commonly known as: VENTOLIN HFA Inhale 1-2 puffs into the lungs every 6 (six) hours as needed for wheezing or shortness of breath.   Breztri Aerosphere 160-9-4.8 MCG/ACT Aero Generic drug: Budeson-Glycopyrrol-Formoterol Inhale 2 puffs into the lungs 2 (two) times daily.   busPIRone 15 MG tablet Commonly known as: BUSPAR Take 15 mg by mouth 2 (two) times daily.   busPIRone 10 MG tablet Commonly known as: BUSPAR TAKE 1 TABLET BY MOUTH TWICE A DAY   cholecalciferol 25 MCG (1000 UNIT) tablet Commonly known as: VITAMIN D3 Take 1,000 Units by mouth every morning.   dexamethasone 4 MG tablet Commonly known as: DECADRON Take 1 tablet (4 mg total) by mouth 4 (four) times daily for 14 days.   EPINEPHrine 0.3 mg/0.3 mL Soaj injection Commonly known as: EPI-PEN Inject 0.3 mg into the muscle once as needed for anaphylaxis.   esomeprazole 20 MG capsule Commonly known as: NEXIUM Take 2 capsules (40 mg total) by mouth daily before breakfast. What changed: how much to take   ferrous sulfate 325 (65 FE) MG tablet Take 325 mg by mouth 3 (three) times a week.   furosemide 20 MG tablet Commonly known as: LASIX TAKE 1 TABLET BY MOUTH IN THE MORNING   hyoscyamine 0.125 MG SL tablet Commonly known as: LEVSIN SL Place 1-2 tablets (0.125-0.25 mg total) under the tongue every 6 (six) hours as needed.   levocetirizine 5 MG tablet Commonly known as: XYZAL Take 5 mg by mouth at bedtime.   levothyroxine 88 MCG tablet Commonly  known as: SYNTHROID Take 1 tablet (88 mcg total) by mouth daily. What changed: when to take this   lisinopril 5 MG tablet Commonly known as: ZESTRIL TAKE 1 TABLET BY MOUTH EVERY DAY What changed: when to take this   ondansetron 4 MG disintegrating tablet Commonly known as: Zofran ODT Take 1 tablet (4 mg total) by mouth every 6 (six) hours as needed for nausea or vomiting.   pravastatin 20 MG tablet Commonly known as: PRAVACHOL TAKE 1 TABLET (20 MG TOTAL) BY MOUTH DAILY. What changed: when to take this   sertraline 100 MG tablet Commonly known as: ZOLOFT Take 2 tablets (200 mg total) by mouth daily. What changed:  how much to take when to take this   solifenacin 10 MG tablet Commonly known as: VESICARE Take 10 mg by mouth at bedtime.   vitamin B-12 1000 MCG tablet Commonly known as: CYANOCOBALAMIN Take 1,000 mcg by mouth every morning.   zonisamide 100 MG capsule Commonly known as: ZONEGRAN Take 2 capsules twice a day What changed:  how much to take how to take this when to take this additional instructions  Allergies  Allergen Reactions   Lamotrigine Swelling    Tongue swelling   Bupropion Other (See Comments)    Unknown reaction   Carbamazepine Other (See Comments)    Hypnatremia   Levetiracetam Other (See Comments)    dizziness   Tramadol Other (See Comments)    Unknown reaction   Adhesive [Tape] Other (See Comments)    ?  Blister after knee surgery   Amitriptyline Nausea And Vomiting   Clarithromycin Other (See Comments)    Unknown reaction     Doxycycline Other (See Comments)    Interfered with seizure medication   Nickel Other (See Comments)    Had to remove knee implant with nickel and replace it   Other Other (See Comments)    Allergic to Metal and perfumes (unknown reaction)   Topamax [Topiramate] Nausea And Vomiting   Estradiol Rash   Latex Rash   Phenytoin Sodium Extended Other (See Comments)    drowsiness      The results  of significant diagnostics from this hospitalization (including imaging, microbiology, ancillary and laboratory) are listed below for reference.    Significant Diagnostic Studies: CT HEAD WO CONTRAST (5MM)  Result Date: 02/16/2021 CLINICAL DATA:  Seizure. EXAM: CT HEAD WITHOUT CONTRAST TECHNIQUE: Contiguous axial images were obtained from the base of the skull through the vertex without intravenous contrast. COMPARISON:  March 03, 2020. FINDINGS: Brain: 20 x 15 mm rounded mass is noted in the left posterior parietal cortex with surrounding white matter edema concerning for malignancy or neoplasm. Ventricular size is within normal limits. No definite midline shift is noted. Small focus of subarachnoid hemorrhage may be present in this area. Vascular: No hyperdense vessel or unexpected calcification. Skull: Normal. Negative for fracture or focal lesion. Sinuses/Orbits: No acute finding. Other: None. IMPRESSION: 2.0 x 1.5 cm rounded mass is noted in the left posterior parietal cortex with surrounding white matter edema concerning for neoplasm or malignancy. MRI with and without gadolinium is recommended for further evaluation. Also noted is possible small focus of subarachnoid hemorrhage in the left posterior parietal region. Critical Value/emergent results were called by telephone at the time of interpretation on 02/16/2021 at 6:56 pm to provider [facility] , who verbally acknowledged these results. Electronically Signed   By: Marijo Conception M.D.   On: 02/16/2021 18:56   MR BRAIN W WO CONTRAST  Result Date: 02/18/2021 CLINICAL DATA:  Brain mass or lesion EXAM: MRI HEAD WITHOUT AND WITH CONTRAST TECHNIQUE: Multiplanar, multiecho pulse sequences of the brain and surrounding structures were obtained without and with intravenous contrast. CONTRAST:  65mL GADAVIST GADOBUTROL 1 MMOL/ML IV SOLN COMPARISON:  02/17/2021 FINDINGS: Brain: Redemonstrated heterogeneously enhancing mass in the left parietal  lobe, which measures approximately 2.4 x 1.5 x 1.9 cm (AP x TR x CC) (series 11, image 33 and series 9, image 82), with adjacent enhancing lesions measuring up to 0.9 and 0.5 cm, unchanged. Hemosiderin deposition is seen within the largest lesion, consistent hemorrhage. Unchanged surrounding edema with mild local mass effect. No acute infarct, hydrocephalus, or midline shift. The basal cisterns are preserved. No new abnormal enhancement. Vascular: Normal flow voids. Skull and upper cervical spine: Normal marrow signal. Sinuses/Orbits: Mucosal thickening in the maxillary sinuses. The orbits are unremarkable Other: The mastoids are well aerated. IMPRESSION: Unchanged heterogeneously enhancing mass, which remains concerning for high-grade primary CNS neoplasm versus metastatic disease. Electronically Signed   By: Merilyn Baba M.D.   On: 02/18/2021 16:44   MR Brain W and  Wo Contrast  Result Date: 02/17/2021 CLINICAL DATA:  Mental status change. Seizure. Abnormal CT scan. Mass lesion. Personal history of vaginal cancer. EXAM: MRI HEAD WITHOUT AND WITH CONTRAST TECHNIQUE: Multiplanar, multiecho pulse sequences of the brain and surrounding structures were obtained without and with intravenous contrast. CONTRAST:  58mL GADAVIST GADOBUTROL 1 MMOL/ML IV SOLN COMPARISON:  CT head without contrast 02/16/2022 FINDINGS: Brain: A heterogeneously enhancing mass lesion is confirmed in the left parietal lobe measuring 2.0 x 2.2 x 1.6 cm. At least 2 adjacent peripherally enhancing lesions are noted more laterally, the larger measuring 10 mm. The smaller measuring 4 mm. The secondary lesions correspond with the area more lateral hyperdensity noted on the CT scan. Extensive surrounding edema is present. No definite hemorrhage is present. There is some local mass effect with effacement of the sulci and partial effacement of the posterior horn of the left lateral ventricle. Minimal white matter disease is present otherwise within  normal limits for age. No acute infarct is present. Basal ganglia are within normal limits. The internal auditory canals are within normal limits. The brainstem and cerebellum are within normal limits. No other areas of pathologic enhancement are present. The postcontrast images are somewhat degraded by patient motion. Vascular: Flow is present in the major intracranial arteries. Skull and upper cervical spine: The craniocervical junction is normal. Upper cervical spine is within normal limits. Marrow signal is unremarkable. Sinuses/Orbits: Small fluid level is present left maxillary sinus. There is some mucosal thickening in the anterior ethmoid air cells and inferior right frontal sinus. The paranasal sinuses and mastoid air cells are otherwise clear. The globes and orbits are within normal limits. IMPRESSION: 1. 2.0 x 2.2 x 1.6 cm heterogeneously enhancing mass lesion in the left parietal lobe with surrounding edema and local mass effect. Differential diagnosis includes high-grade primary CNS neoplasm versus metastatic disease. At least 2 adjacent lesions are present. 2. Marked surrounding vasogenic edema with mass effect as described. No midline shift. 3. Mild sinus disease as described. Electronically Signed   By: San Morelle M.D.   On: 02/17/2021 12:13   CT CHEST ABDOMEN PELVIS W CONTRAST  Result Date: 02/18/2021 CLINICAL DATA:  Metastatic disease evaluation. Witnessed seizure. History of vaginal cancer. Left parietal intracranial mass. EXAM: CT CHEST, ABDOMEN, AND PELVIS WITH CONTRAST TECHNIQUE: Multidetector CT imaging of the chest, abdomen and pelvis was performed following the standard protocol during bolus administration of intravenous contrast. CONTRAST:  158mL OMNIPAQUE IOHEXOL 350 MG/ML SOLN COMPARISON:  Chest radiography 12/17/2020. CT chest 01/01/2021. CT abdomen 05/11/2020. FINDINGS: CT CHEST FINDINGS Cardiovascular: Heart size is normal. No pericardial fluid. Coronary artery  calcification is present. Aortic atherosclerotic calcification is present. No pulmonary emboli are seen. Pulmonary artery remains prominent, suggesting a degree of pulmonary arterial hypertension. Mediastinum/Nodes: No mediastinal or hilar mass or lymphadenopathy. Lungs/Pleura: Redemonstration of an upper lobe predominant fibrotic pattern, unchanged since the previous study. This could be due to atypical pulmonary fibrosis, or chronic hypersensitivity pneumonitis. There is no evidence of pulmonary metastatic disease. No pleural effusion. No collapse or pneumonia. Musculoskeletal: No evidence of osseous metastatic disease within the chest. CT ABDOMEN PELVIS FINDINGS Hepatobiliary: No liver lesion. No calcified gallstones. No ductal dilatation. Pancreas: Normal Spleen: Normal Adrenals/Urinary Tract: Adrenal glands are normal. Kidneys are normal. No cyst, mass, stone or hydronephrosis. Bladder is normal. Stomach/Bowel: Stomach and small intestine are normal. Colon is normal. Vascular/Lymphatic: Aortic atherosclerosis. No aneurysm. IVC is normal. No retroperitoneal adenopathy. Reproductive: Normal.  No pelvic mass. Other: No free fluid  or air.  No hernia. Musculoskeletal: Previous discectomy and fusion procedure at L4-5. No evidence of lumbosacral metastatic disease. No pelvic bone or proximal femoral lesion. IMPRESSION: No evidence of metastatic disease or primary mass in the chest, abdomen or pelvis. Chronic pulmonary fibrosis or sensitivity pneumonitis changes as described at previous dedicated chest CT. No active process otherwise. Chronic pulmonary artery prominence suggests a degree of pulmonary arterial hypertension. Aortic atherosclerosis and coronary artery calcification. No abdominal or pelvic organ pathology seen. Aortic atherosclerotic changes. Previous L4-5 discectomy and fusion. No evidence of skeletal metastatic disease. Electronically Signed   By: Nelson Chimes M.D.   On: 02/18/2021 10:28   DG Chest  Portable 1 View  Result Date: 02/16/2021 CLINICAL DATA:  Hypoxia and seizure EXAM: PORTABLE CHEST 1 VIEW COMPARISON:  11/21/2020 FINDINGS: The cardiomediastinal silhouette is unchanged. Increasing interstitial and airspace opacities bilaterally are noted. Elevated RIGHT hemidiaphragm is again noted. No pneumothorax or large pleural effusion. IMPRESSION: Increasing interstitial and airspace opacities bilaterally which may represent edema versus infection/pneumonia. Electronically Signed   By: Margarette Canada M.D.   On: 02/16/2021 18:19   EEG adult  Result Date: 02/18/2021 Lora Havens, MD     02/18/2021  1:32 PM Patient Name: Beverly Ballard MRN: [id number] Epilepsy Attending: Lora Havens Referring Physician/Provider: Dr Donnetta Simpers Date: 02/18/2021 Duration: 23.36 mins Patient history: a 75 y.o. female with PMH significant for anxiety, depression, constipation, hypertension, hypothyroidism, sleep apnea, seizures on zonisamide 100 mg twice daily who presented with 3 seizures. Found to have a enhancing lesion in left parietal lobe concerning for malignancy.  EEG to evaluate for seizures. Level of alertness: Awake, asleep AEDs during EEG study: ZNS Technical aspects: This EEG study was done with scalp electrodes positioned according to the 10-20 International system of electrode placement. Electrical activity was acquired at a sampling rate of 500Hz  and reviewed with a high frequency filter of 70Hz  and a low frequency filter of 1Hz . EEG data were recorded continuously and digitally stored. Description: The posterior dominant rhythm consists of 9-10 Hz activity of moderate voltage (25-35 uV) seen predominantly in posterior head regions, asymmetric ( left<right) and reactive to eye opening and eye closing. Sleep was characterized by vertex waves, sleep spindles (12 to 14 Hz), maximal frontocentral region.  EEG showed near continuous left hemisphere, maximal left parietal 3 to 5 hz theta-delta slowing.  One burst of generalized polyspikes was noted.  Single left centro- parietal spike was also noted. Hyperventilation and photic stimulation were not performed.   ABNORMALITY -Polyspikes, generalized -Spike, left centro- parietal region -Continuous slow, left hemisphere, maximal left parietal region IMPRESSION: This study is consistent with patient's known history of generalized epilepsy.  Additionally there is single spike in left centro-parietal region which could be part of the generalized epilepsy. However due to underlying left parietal lesion, this could also be due to independent epilepsy arising from  left centro- parietal region.  There is also evidence of cortical dysfunction in left hemisphere, maximal left parietal region likely secondary to underlying structural abnormality.  No seizures were seen during the study. Lora Havens    Microbiology: Recent Results (from the past 240 hour(s))  Resp Panel by RT-PCR (Flu A&B, Covid) Nasopharyngeal Swab     Status: None   Collection Time: 02/16/21  5:45 PM   Specimen: Nasopharyngeal Swab; Nasopharyngeal(NP) swabs in vial transport medium  Result Value Ref Range Status   SARS Coronavirus 2 by RT PCR NEGATIVE NEGATIVE Final    Comment: (  NOTE) SARS-CoV-2 target nucleic acids are NOT DETECTED.  The SARS-CoV-2 RNA is generally detectable in upper respiratory specimens during the acute phase of infection. The lowest concentration of SARS-CoV-2 viral copies this assay can detect is 138 copies/mL. A negative result does not preclude SARS-Cov-2 infection and should not be used as the sole basis for treatment or other patient management decisions. A negative result may occur with  improper specimen collection/handling, submission of specimen other than nasopharyngeal swab, presence of viral mutation(s) within the areas targeted by this assay, and inadequate number of viral copies(<138 copies/mL). A negative result must be combined with clinical  observations, patient history, and epidemiological information. The expected result is Negative.  Fact Sheet for Patients:  EntrepreneurPulse.com.au  Fact Sheet for Healthcare Providers:  IncredibleEmployment.be  This test is no t yet approved or cleared by the [place] FDA and  has been authorized for detection and/or diagnosis of SARS-CoV-2 by FDA under an Emergency Use Authorization (EUA). This EUA will remain  in effect (meaning this test can be used) for the duration of the COVID-19 declaration under Section 564(b)(1) of the Act, 21 U.S.C.section 360bbb-3(b)(1), unless the authorization is terminated  or revoked sooner.       Influenza A by PCR NEGATIVE NEGATIVE Final   Influenza B by PCR NEGATIVE NEGATIVE Final    Comment: (NOTE) The Xpert Xpress SARS-CoV-2/FLU/RSV plus assay is intended as an aid in the diagnosis of influenza from Nasopharyngeal swab specimens and should not be used as a sole basis for treatment. Nasal washings and aspirates are unacceptable for Xpert Xpress SARS-CoV-2/FLU/RSV testing.  Fact Sheet for Patients: EntrepreneurPulse.com.au  Fact Sheet for Healthcare Providers: IncredibleEmployment.be  This test is not yet approved or cleared by the [place] FDA and has been authorized for detection and/or diagnosis of SARS-CoV-2 by FDA under an Emergency Use Authorization (EUA). This EUA will remain in effect (meaning this test can be used) for the duration of the COVID-19 declaration under Section 564(b)(1) of the Act, 21 U.S.C. section 360bbb-3(b)(1), unless the authorization is terminated or revoked.  Performed at [facility], [place] [address] [place], [place] [address]      Labs: Basic Metabolic Panel: Recent Labs  Lab 02/16/21 1734 02/16/21 1756 02/17/21 0639 02/18/21 0440  NA 140 141 138 139  K 4.1 4.0 3.9 4.2  CL 109 108 110 112*  CO2 19*  --  22  23  GLUCOSE 120* 113* 102* 105*  BUN 7* 7* 8 10  CREATININE 1.06* 1.00 0.89 0.85  CALCIUM 8.6*  --  8.6* 8.8*  MG 2.3  --   --   --    Liver Function Tests: Recent Labs  Lab 02/16/21 1734  AST 32  ALT 12  ALKPHOS 81  BILITOT 0.5  PROT 6.6  ALBUMIN 3.4*   No results for input(s): LIPASE, AMYLASE in the last 168 hours. No results for input(s): AMMONIA in the last 168 hours. CBC: Recent Labs  Lab 02/16/21 1734 02/16/21 1756 02/18/21 0440  WBC 9.9  --  8.0  NEUTROABS 7.3  --   --   HGB 13.2 13.6 12.2  HCT 41.9 40.0 37.9  MCV 99.5  --  97.2  PLT 183  --  178   Cardiac Enzymes: No results for input(s): CKTOTAL, CKMB, CKMBINDEX, TROPONINI in the last 168 hours. BNP: BNP (last 3 results) Recent Labs    05/11/20 1248 02/16/21 1734  BNP 177.7* 203.0*    ProBNP (last 3 results)  No results for input(s): PROBNP in the last 8760 hours.  CBG: Recent Labs  Lab 02/16/21 1729 02/18/21 0138  GLUCAP 116* 115*       Signed:  Oswald Hillock MD.  [facility] 02/18/2021, 5:33 PM

## 2021-02-18 NOTE — Procedures (Addendum)
Patient Name: RAYLEN KEN  MRN: [id number]  Epilepsy Attending: Lora Havens  Referring Physician/Provider: Dr Donnetta Simpers Date: 02/18/2021  Duration: 23.36 mins  Patient history: a 75 y.o. female with PMH significant for anxiety, depression, constipation, hypertension, hypothyroidism, sleep apnea, seizures on zonisamide 100 mg twice daily who presented with 3 seizures. Found to have a enhancing lesion in left parietal lobe concerning for malignancy.  EEG to evaluate for seizures.  Level of alertness: Awake, asleep  AEDs during EEG study: ZNS  Technical aspects: This EEG study was done with scalp electrodes positioned according to the 10-20 International system of electrode placement. Electrical activity was acquired at a sampling rate of 500Hz  and reviewed with a high frequency filter of 70Hz  and a low frequency filter of 1Hz . EEG data were recorded continuously and digitally stored.   Description: The posterior dominant rhythm consists of 9-10 Hz activity of moderate voltage (25-35 uV) seen predominantly in posterior head regions, asymmetric ( left<right) and reactive to eye opening and eye closing. Sleep was characterized by vertex waves, sleep spindles (12 to 14 Hz), maximal frontocentral region.  EEG showed near continuous left hemisphere, maximal left parietal 3 to 5 hz theta-delta slowing. One burst of generalized polyspikes was noted.  Single left centro- parietal spike was also noted. Hyperventilation and photic stimulation were not performed.     ABNORMALITY -Polyspikes, generalized -Spike, left centro- parietal region -Continuous slow, left hemisphere, maximal left parietal region  IMPRESSION: This study is consistent with patient's known history of generalized epilepsy.  Additionally there is single spike in left centro-parietal region which could be part of the generalized epilepsy. However due to underlying left parietal lesion, this could also be due to independent  epilepsy arising from  left centro- parietal region.  There is also evidence of cortical dysfunction in left hemisphere, maximal left parietal region likely secondary to underlying structural abnormality.  No seizures were seen during the study.   Aarib Pulido Barbra Sarks

## 2021-02-18 NOTE — Progress Notes (Signed)
Looks much better.  I suspect she is almost back to her baseline though I think there is still some higher cognitive issues.  No focal motor or sensory deficit.  She had a CT scan of the chest recently that showed no evidence of tumor.  She had a CT scan of the abdomen and pelvis in the last 6 months that showed no tumor.  She had an MRI of the brain done in April for "concussion" which showed some signal change in the white matter on the left.  I suspect she had the beginnings of tumor at that time.  I had a long discussion with the patient and her husband and her daughter regarding the imaging findings.  I suspect that it is a high-grade glioma though it is difficult to know for sure in this case.  Therefore, I would recommend a tissue diagnosis.  We talked about the utility of stereotactic biopsy versus craniotomy for resection of the lesion.  We talked about the advantages and disadvantages of each.  For now we plan on craniotomy for resection of the lesion on Friday.  We will get a new MRI of the brain with and without contrast with a BrainLab protocol for stereotactic navigation for the surgery.  They understand the risk of the surgery include but are not limited to bleeding, infection, visual loss, numbness, weakness, paralysis, incomplete resection, need for further surgery, lack of relief of symptoms, worsening symptoms, seizures, venous infarct, sinus occlusion, sinus injury, diagnosis, and anesthesia risk including DVT pneumonia MI and death.  They agree to proceed.  It is our opinion that she can go home from our standpoint on steroids and antiepileptics and return for surgery on Friday as an outpatient.  Please do not let her go home before the new MRI is completed.

## 2021-02-18 NOTE — ED Notes (Signed)
Pt alert oriented x 4

## 2021-02-18 NOTE — Telephone Encounter (Signed)
90 day ok?

## 2021-02-18 NOTE — ED Notes (Signed)
Provider at bedside. Per provider let him know when CTA complete so pt can eat

## 2021-02-18 NOTE — ED Notes (Signed)
Patient transported to MRI 

## 2021-02-18 NOTE — ED Notes (Signed)
Pt discharged home with husband. Verbalized understanding of paperwork and surgery follow-up

## 2021-02-19 ENCOUNTER — Other Ambulatory Visit: Payer: Self-pay | Admitting: Neurological Surgery

## 2021-02-19 ENCOUNTER — Telehealth: Payer: Self-pay

## 2021-02-19 ENCOUNTER — Telehealth: Payer: Self-pay | Admitting: Neurology

## 2021-02-19 LAB — URINE CULTURE

## 2021-02-19 NOTE — Telephone Encounter (Signed)
Transition Care Management Unsuccessful Follow-up Telephone Call  Date of discharge and from where:  02/18/21 from [facility]  Attempts:  1st Attempt  Reason for unsuccessful TCM follow-up call:  Left voice message

## 2021-02-19 NOTE — Telephone Encounter (Signed)
Transition Care Management Follow-up Telephone Call Date of discharge and from where: 02/18/21 from [facility]. Pt states she does not think she was admitted to hospital. States they were going to admit her, but decided to "just have surgery" on her brain on Friday. How have you been since you were released from the hospital? Pt states she's "not feeling too good" States she feels confused about discharge & what to do & doesn't recall what happened to her. Any questions or concerns? Yes  Items Reviewed: Did the pt receive and understand the discharge instructions provided? No  Medications obtained and verified? No  Any new allergies since your discharge? No  Dietary orders reviewed? No Do you have support at home? Yes   Home Care and Equipment/Supplies: Were home health services ordered? not applicable   Functional Questionnaire: (I = Independent and D = Dependent) ADLs: I  Bathing/Dressing- I  Meal Prep- I  Eating- I  Maintaining continence- I  Transferring/Ambulation- I  Managing Meds- I  Follow up appointments reviewed:  PCP Hospital f/u appt confirmed? No   Specialist Hospital f/u appt confirmed? Yes  Surgery scheduled for 02/22/21. Are transportation arrangements needed? Yes  If their condition worsens, is the pt aware to call PCP or go to the Emergency Dept.? Yes Was the patient provided with contact information for the PCP's office or ED? Yes Was to pt encouraged to call back with questions or concerns? Yes

## 2021-02-19 NOTE — Telephone Encounter (Signed)
Pt called in to let aquino know they found a tumor and she will be having surgery 02/22/21. She has had 4 seizures. She said she didn't need a call, just wanted to let her know

## 2021-02-20 DIAGNOSIS — G4733 Obstructive sleep apnea (adult) (pediatric): Secondary | ICD-10-CM | POA: Diagnosis not present

## 2021-02-20 NOTE — Telephone Encounter (Signed)
Notes and imaging reviewed.

## 2021-02-21 ENCOUNTER — Other Ambulatory Visit (HOSPITAL_COMMUNITY): Payer: Self-pay | Admitting: Neurological Surgery

## 2021-02-21 ENCOUNTER — Telehealth: Payer: Self-pay | Admitting: Physician Assistant

## 2021-02-21 ENCOUNTER — Encounter (HOSPITAL_COMMUNITY): Payer: Self-pay | Admitting: Neurological Surgery

## 2021-02-21 ENCOUNTER — Other Ambulatory Visit: Payer: Self-pay

## 2021-02-21 DIAGNOSIS — D496 Neoplasm of unspecified behavior of brain: Secondary | ICD-10-CM

## 2021-02-21 NOTE — Telephone Encounter (Signed)
Beverly Ballard gave our office a call earlier today, canceling her follow-up appointment on 02/28/2021.  Stated she has a brain tumor and is having surgery tomorrow. I reviewed the ER note from 02/16/2021, grand mal seizure, hypoxia, CT and MRI show brain mass. I gave Camdynn a call to let her know I am thinking about her and will be praying for her speedy recovery.

## 2021-02-21 NOTE — Progress Notes (Signed)
Spoke with pt for pre-op call. Pt recently in hospital due to having a seizure. Pt was newly diagnosed with new onset Atrial Fib/Flutter during hospitalization. Pt states she is not diabetic.  Will need Covid test on arrival tomorrow.

## 2021-02-22 ENCOUNTER — Encounter (HOSPITAL_COMMUNITY)
Admission: RE | Disposition: A | Discharge status: Home Health | Payer: Self-pay | Source: Home / Self Care | Attending: Neurological Surgery

## 2021-02-22 ENCOUNTER — Inpatient Hospital Stay (HOSPITAL_COMMUNITY): Payer: Medicare HMO | Admitting: Physician Assistant

## 2021-02-22 ENCOUNTER — Inpatient Hospital Stay (HOSPITAL_COMMUNITY)
Admission: RE | Admit: 2021-02-22 | Discharge: 2021-02-22 | Disposition: A | Discharge status: Home / Self Care | Payer: Medicare HMO | Source: Ambulatory Visit | Attending: Neurological Surgery | Admitting: Neurological Surgery

## 2021-02-22 ENCOUNTER — Inpatient Hospital Stay (HOSPITAL_COMMUNITY)
Admission: RE | Admit: 2021-02-22 | Discharge: 2021-02-27 | DRG: 025 | Disposition: A | Discharge status: Home Health | Payer: Medicare HMO | Attending: Neurological Surgery | Admitting: Neurological Surgery

## 2021-02-22 ENCOUNTER — Encounter (HOSPITAL_COMMUNITY): Payer: Self-pay | Admitting: Neurological Surgery

## 2021-02-22 DIAGNOSIS — G40909 Epilepsy, unspecified, not intractable, without status epilepticus: Secondary | ICD-10-CM | POA: Diagnosis not present

## 2021-02-22 DIAGNOSIS — D496 Neoplasm of unspecified behavior of brain: Secondary | ICD-10-CM | POA: Diagnosis not present

## 2021-02-22 DIAGNOSIS — Z79890 Hormone replacement therapy (postmenopausal): Secondary | ICD-10-CM | POA: Diagnosis not present

## 2021-02-22 DIAGNOSIS — M109 Gout, unspecified: Secondary | ICD-10-CM | POA: Diagnosis present

## 2021-02-22 DIAGNOSIS — R220 Localized swelling, mass and lump, head: Secondary | ICD-10-CM | POA: Diagnosis not present

## 2021-02-22 DIAGNOSIS — G4733 Obstructive sleep apnea (adult) (pediatric): Secondary | ICD-10-CM | POA: Diagnosis not present

## 2021-02-22 DIAGNOSIS — E039 Hypothyroidism, unspecified: Secondary | ICD-10-CM | POA: Diagnosis present

## 2021-02-22 DIAGNOSIS — Z823 Family history of stroke: Secondary | ICD-10-CM | POA: Diagnosis not present

## 2021-02-22 DIAGNOSIS — Z87891 Personal history of nicotine dependence: Secondary | ICD-10-CM | POA: Diagnosis not present

## 2021-02-22 DIAGNOSIS — Z6838 Body mass index (BMI) 38.0-38.9, adult: Secondary | ICD-10-CM | POA: Diagnosis not present

## 2021-02-22 DIAGNOSIS — Z8544 Personal history of malignant neoplasm of other female genital organs: Secondary | ICD-10-CM | POA: Diagnosis not present

## 2021-02-22 DIAGNOSIS — Z743 Need for continuous supervision: Secondary | ICD-10-CM | POA: Diagnosis not present

## 2021-02-22 DIAGNOSIS — Z79899 Other long term (current) drug therapy: Secondary | ICD-10-CM

## 2021-02-22 DIAGNOSIS — R079 Chest pain, unspecified: Secondary | ICD-10-CM | POA: Diagnosis not present

## 2021-02-22 DIAGNOSIS — I517 Cardiomegaly: Secondary | ICD-10-CM | POA: Diagnosis not present

## 2021-02-22 DIAGNOSIS — Z808 Family history of malignant neoplasm of other organs or systems: Secondary | ICD-10-CM | POA: Diagnosis not present

## 2021-02-22 DIAGNOSIS — K219 Gastro-esophageal reflux disease without esophagitis: Secondary | ICD-10-CM | POA: Diagnosis present

## 2021-02-22 DIAGNOSIS — C713 Malignant neoplasm of parietal lobe: Secondary | ICD-10-CM | POA: Diagnosis not present

## 2021-02-22 DIAGNOSIS — R69 Illness, unspecified: Secondary | ICD-10-CM | POA: Diagnosis not present

## 2021-02-22 DIAGNOSIS — G936 Cerebral edema: Secondary | ICD-10-CM | POA: Diagnosis present

## 2021-02-22 DIAGNOSIS — I4892 Unspecified atrial flutter: Secondary | ICD-10-CM | POA: Diagnosis not present

## 2021-02-22 DIAGNOSIS — I480 Paroxysmal atrial fibrillation: Secondary | ICD-10-CM | POA: Diagnosis present

## 2021-02-22 DIAGNOSIS — R0902 Hypoxemia: Secondary | ICD-10-CM | POA: Diagnosis not present

## 2021-02-22 DIAGNOSIS — R569 Unspecified convulsions: Secondary | ICD-10-CM | POA: Diagnosis not present

## 2021-02-22 DIAGNOSIS — R0689 Other abnormalities of breathing: Secondary | ICD-10-CM | POA: Diagnosis not present

## 2021-02-22 DIAGNOSIS — E785 Hyperlipidemia, unspecified: Secondary | ICD-10-CM | POA: Diagnosis present

## 2021-02-22 DIAGNOSIS — Z96652 Presence of left artificial knee joint: Secondary | ICD-10-CM | POA: Diagnosis present

## 2021-02-22 DIAGNOSIS — R0600 Dyspnea, unspecified: Secondary | ICD-10-CM | POA: Diagnosis not present

## 2021-02-22 DIAGNOSIS — I484 Atypical atrial flutter: Secondary | ICD-10-CM | POA: Diagnosis not present

## 2021-02-22 DIAGNOSIS — I4891 Unspecified atrial fibrillation: Secondary | ICD-10-CM | POA: Diagnosis not present

## 2021-02-22 DIAGNOSIS — Z9989 Dependence on other enabling machines and devices: Secondary | ICD-10-CM | POA: Diagnosis not present

## 2021-02-22 DIAGNOSIS — F32A Depression, unspecified: Secondary | ICD-10-CM | POA: Diagnosis present

## 2021-02-22 DIAGNOSIS — G9389 Other specified disorders of brain: Secondary | ICD-10-CM | POA: Diagnosis not present

## 2021-02-22 DIAGNOSIS — F419 Anxiety disorder, unspecified: Secondary | ICD-10-CM | POA: Diagnosis present

## 2021-02-22 DIAGNOSIS — C719 Malignant neoplasm of brain, unspecified: Secondary | ICD-10-CM | POA: Diagnosis not present

## 2021-02-22 DIAGNOSIS — Z9889 Other specified postprocedural states: Secondary | ICD-10-CM

## 2021-02-22 DIAGNOSIS — Z20822 Contact with and (suspected) exposure to covid-19: Secondary | ICD-10-CM | POA: Diagnosis present

## 2021-02-22 DIAGNOSIS — I10 Essential (primary) hypertension: Secondary | ICD-10-CM | POA: Diagnosis not present

## 2021-02-22 DIAGNOSIS — Z01818 Encounter for other preprocedural examination: Secondary | ICD-10-CM | POA: Diagnosis not present

## 2021-02-22 DIAGNOSIS — R404 Transient alteration of awareness: Secondary | ICD-10-CM | POA: Diagnosis not present

## 2021-02-22 HISTORY — DX: Anemia, unspecified: D64.9

## 2021-02-22 HISTORY — PX: CRANIOTOMY: SHX93

## 2021-02-22 HISTORY — PX: APPLICATION OF CRANIAL NAVIGATION: SHX6578

## 2021-02-22 HISTORY — DX: Headache, unspecified: R51.9

## 2021-02-22 LAB — TYPE AND SCREEN
ABO/RH(D): B NEG
Antibody Screen: NEGATIVE

## 2021-02-22 LAB — MRSA NEXT GEN BY PCR, NASAL: MRSA by PCR Next Gen: NOT DETECTED

## 2021-02-22 LAB — SARS CORONAVIRUS 2 BY RT PCR (HOSPITAL ORDER, PERFORMED IN ~~LOC~~ HOSPITAL LAB): SARS Coronavirus 2: NEGATIVE

## 2021-02-22 SURGERY — CRANIOTOMY TUMOR EXCISION
Anesthesia: General | Site: Head

## 2021-02-22 MED ORDER — ONDANSETRON HCL 4 MG/2ML IJ SOLN
INTRAMUSCULAR | Status: DC | PRN
  Administered 2021-02-22: 4 mg via INTRAVENOUS

## 2021-02-22 MED ORDER — DEXAMETHASONE SODIUM PHOSPHATE 4 MG/ML IJ SOLN
4.0000 mg | Freq: Four times a day (QID) | INTRAMUSCULAR | Status: DC
  Administered 2021-02-23 – 2021-02-24 (×3): 4 mg via INTRAVENOUS
  Filled 2021-02-22 (×3): qty 1

## 2021-02-22 MED ORDER — POTASSIUM CHLORIDE IN NACL 20-0.9 MEQ/L-% IV SOLN
INTRAVENOUS | Status: DC
  Filled 2021-02-22 (×2): qty 1000

## 2021-02-22 MED ORDER — ESMOLOL HCL 100 MG/10ML IV SOLN
INTRAVENOUS | Status: DC | PRN
  Administered 2021-02-22 (×2): 20 mg via INTRAVENOUS

## 2021-02-22 MED ORDER — THROMBIN 20000 UNITS EX SOLR: CUTANEOUS | Status: DC | PRN

## 2021-02-22 MED ORDER — SENNA 8.6 MG PO TABS
1.0000 | ORAL_TABLET | Freq: Two times a day (BID) | ORAL | Status: DC
  Administered 2021-02-22 – 2021-02-26 (×9): 8.6 mg via ORAL
  Filled 2021-02-22 (×10): qty 1

## 2021-02-22 MED ORDER — LISINOPRIL 5 MG PO TABS
5.0000 mg | ORAL_TABLET | Freq: Every morning | ORAL | Status: DC
  Administered 2021-02-23 – 2021-02-24 (×2): 5 mg via ORAL
  Filled 2021-02-22 (×3): qty 1

## 2021-02-22 MED ORDER — SODIUM CHLORIDE 0.9 % IV SOLN: INTRAVENOUS | Status: DC

## 2021-02-22 MED ORDER — LORATADINE 10 MG PO TABS
5.0000 mg | ORAL_TABLET | Freq: Every day | ORAL | Status: DC
  Administered 2021-02-22 – 2021-02-26 (×5): 5 mg via ORAL
  Filled 2021-02-22 (×5): qty 1

## 2021-02-22 MED ORDER — ZONISAMIDE 100 MG PO CAPS
200.0000 mg | ORAL_CAPSULE | Freq: Two times a day (BID) | ORAL | Status: DC
  Administered 2021-02-22 – 2021-02-27 (×10): 200 mg via ORAL
  Filled 2021-02-22 (×12): qty 2

## 2021-02-22 MED ORDER — ORAL CARE MOUTH RINSE: 15.0000 mL | Freq: Once | OROMUCOSAL | Status: AC

## 2021-02-22 MED ORDER — DARIFENACIN HYDROBROMIDE ER 7.5 MG PO TB24
7.5000 mg | ORAL_TABLET | Freq: Every day | ORAL | Status: DC
  Administered 2021-02-22 – 2021-02-27 (×6): 7.5 mg via ORAL
  Filled 2021-02-22 (×6): qty 1

## 2021-02-22 MED ORDER — SODIUM CHLORIDE 0.9 % IV SOLN: INTRAVENOUS | Status: DC | PRN

## 2021-02-22 MED ORDER — BACITRACIN ZINC 500 UNIT/GM EX OINT
TOPICAL_OINTMENT | CUTANEOUS | Status: DC | PRN
  Administered 2021-02-22: 1 via TOPICAL

## 2021-02-22 MED ORDER — CLEVIDIPINE BUTYRATE 0.5 MG/ML IV EMUL
INTRAVENOUS | Status: DC | PRN
  Administered 2021-02-22: 2 mg/h via INTRAVENOUS

## 2021-02-22 MED ORDER — FENTANYL CITRATE (PF) 100 MCG/2ML IJ SOLN
INTRAMUSCULAR | Status: AC
  Administered 2021-02-22: 50 ug via INTRAVENOUS
  Filled 2021-02-22: qty 2

## 2021-02-22 MED ORDER — HYDROCODONE-ACETAMINOPHEN 5-325 MG PO TABS
1.0000 | ORAL_TABLET | ORAL | Status: DC | PRN
  Administered 2021-02-22 – 2021-02-26 (×11): 1 via ORAL
  Filled 2021-02-22 (×12): qty 1

## 2021-02-22 MED ORDER — ACETAMINOPHEN 650 MG RE SUPP: 650.0000 mg | RECTAL | Status: DC | PRN

## 2021-02-22 MED ORDER — ONDANSETRON HCL 4 MG/2ML IJ SOLN
4.0000 mg | INTRAMUSCULAR | Status: DC | PRN
  Filled 2021-02-22: qty 2

## 2021-02-22 MED ORDER — CEFAZOLIN SODIUM-DEXTROSE 1-4 GM/50ML-% IV SOLN
1.0000 g | Freq: Three times a day (TID) | INTRAVENOUS | Status: AC
  Administered 2021-02-22 – 2021-02-23 (×2): 1 g via INTRAVENOUS
  Filled 2021-02-22 (×2): qty 50

## 2021-02-22 MED ORDER — CHLORHEXIDINE GLUCONATE CLOTH 2 % EX PADS
6.0000 | MEDICATED_PAD | Freq: Every day | CUTANEOUS | Status: DC
  Administered 2021-02-24 – 2021-02-25 (×2): 6 via TOPICAL

## 2021-02-22 MED ORDER — PROMETHAZINE HCL 25 MG PO TABS
12.5000 mg | ORAL_TABLET | ORAL | Status: DC | PRN
  Administered 2021-02-25: 25 mg via ORAL
  Filled 2021-02-22 (×2): qty 1

## 2021-02-22 MED ORDER — PROPOFOL 10 MG/ML IV BOLUS
INTRAVENOUS | Status: DC | PRN
  Administered 2021-02-22: 100 mg via INTRAVENOUS

## 2021-02-22 MED ORDER — PHENYLEPHRINE HCL-NACL 20-0.9 MG/250ML-% IV SOLN
INTRAVENOUS | Status: DC | PRN
  Administered 2021-02-22: 40 ug/min via INTRAVENOUS

## 2021-02-22 MED ORDER — LIDOCAINE-EPINEPHRINE 1 %-1:100000 IJ SOLN
INTRAMUSCULAR | Status: DC | PRN
  Administered 2021-02-22: 10 mL

## 2021-02-22 MED ORDER — CHLORHEXIDINE GLUCONATE CLOTH 2 % EX PADS
6.0000 | MEDICATED_PAD | Freq: Once | CUTANEOUS | Status: AC
  Administered 2021-02-22: 6 via TOPICAL

## 2021-02-22 MED ORDER — CHLORHEXIDINE GLUCONATE 0.12 % MT SOLN
15.0000 mL | Freq: Once | OROMUCOSAL | Status: AC
  Administered 2021-02-22: 15 mL via OROMUCOSAL
  Filled 2021-02-22: qty 15

## 2021-02-22 MED ORDER — LEVOTHYROXINE SODIUM 88 MCG PO TABS
88.0000 ug | ORAL_TABLET | Freq: Every day | ORAL | Status: DC
  Administered 2021-02-23 – 2021-02-27 (×5): 88 ug via ORAL
  Filled 2021-02-22 (×5): qty 1

## 2021-02-22 MED ORDER — LACTATED RINGERS IV SOLN: INTRAVENOUS | Status: DC

## 2021-02-22 MED ORDER — CEFAZOLIN SODIUM-DEXTROSE 2-4 GM/100ML-% IV SOLN
2.0000 g | INTRAVENOUS | Status: AC
Start: 2021-02-22 — End: 2021-02-22
  Administered 2021-02-22: 2 g via INTRAVENOUS
  Filled 2021-02-22: qty 100

## 2021-02-22 MED ORDER — HEMOSTATIC AGENTS (NO CHARGE) OPTIME
TOPICAL | Status: DC | PRN
  Administered 2021-02-22: 1 via TOPICAL

## 2021-02-22 MED ORDER — ACETAMINOPHEN 325 MG PO TABS
650.0000 mg | ORAL_TABLET | ORAL | Status: DC | PRN
  Administered 2021-02-22 – 2021-02-27 (×4): 650 mg via ORAL
  Filled 2021-02-22 (×5): qty 2

## 2021-02-22 MED ORDER — FENTANYL CITRATE (PF) 100 MCG/2ML IJ SOLN
25.0000 ug | INTRAMUSCULAR | Status: DC | PRN
  Administered 2021-02-22: 50 ug via INTRAVENOUS

## 2021-02-22 MED ORDER — MORPHINE SULFATE (PF) 2 MG/ML IV SOLN
1.0000 mg | INTRAVENOUS | Status: DC | PRN
  Administered 2021-02-22 – 2021-02-24 (×8): 2 mg via INTRAVENOUS
  Filled 2021-02-22 (×8): qty 1

## 2021-02-22 MED ORDER — ACETAMINOPHEN 500 MG PO TABS
1000.0000 mg | ORAL_TABLET | Freq: Once | ORAL | Status: AC
  Administered 2021-02-22: 1000 mg via ORAL
  Filled 2021-02-22: qty 2

## 2021-02-22 MED ORDER — BACITRACIN ZINC 500 UNIT/GM EX OINT
TOPICAL_OINTMENT | CUTANEOUS | Status: AC
  Filled 2021-02-22: qty 28.35

## 2021-02-22 MED ORDER — PROPOFOL 10 MG/ML IV BOLUS
INTRAVENOUS | Status: AC
  Filled 2021-02-22: qty 20

## 2021-02-22 MED ORDER — ROCURONIUM BROMIDE 10 MG/ML (PF) SYRINGE
PREFILLED_SYRINGE | INTRAVENOUS | Status: DC | PRN
  Administered 2021-02-22: 70 mg via INTRAVENOUS
  Administered 2021-02-22: 30 mg via INTRAVENOUS

## 2021-02-22 MED ORDER — PANTOPRAZOLE SODIUM 40 MG IV SOLR
40.0000 mg | Freq: Every day | INTRAVENOUS | Status: DC
  Administered 2021-02-22 – 2021-02-23 (×2): 40 mg via INTRAVENOUS
  Filled 2021-02-22 (×2): qty 40

## 2021-02-22 MED ORDER — ALBUTEROL SULFATE (2.5 MG/3ML) 0.083% IN NEBU: 3.0000 mL | INHALATION_SOLUTION | Freq: Four times a day (QID) | RESPIRATORY_TRACT | Status: DC | PRN

## 2021-02-22 MED ORDER — SODIUM CHLORIDE 0.9 % IV SOLN
0.0500 ug/kg/min | Freq: Once | INTRAVENOUS | Status: AC
  Administered 2021-02-22: .2 ug/kg/min via INTRAVENOUS
  Filled 2021-02-22: qty 5000

## 2021-02-22 MED ORDER — SERTRALINE HCL 100 MG PO TABS
100.0000 mg | ORAL_TABLET | Freq: Two times a day (BID) | ORAL | Status: DC
  Administered 2021-02-22 – 2021-02-27 (×10): 100 mg via ORAL
  Filled 2021-02-22: qty 1
  Filled 2021-02-22: qty 2
  Filled 2021-02-22: qty 1
  Filled 2021-02-22 (×2): qty 2
  Filled 2021-02-22: qty 1
  Filled 2021-02-22 (×3): qty 2
  Filled 2021-02-22: qty 1

## 2021-02-22 MED ORDER — DEXAMETHASONE SODIUM PHOSPHATE 4 MG/ML IJ SOLN: 4.0000 mg | Freq: Three times a day (TID) | INTRAMUSCULAR | Status: DC

## 2021-02-22 MED ORDER — FENTANYL CITRATE (PF) 100 MCG/2ML IJ SOLN
INTRAMUSCULAR | Status: AC
  Administered 2021-02-22: 100 ug via INTRAVENOUS
  Filled 2021-02-22: qty 2

## 2021-02-22 MED ORDER — THROMBIN 20000 UNITS EX SOLR
CUTANEOUS | Status: AC
  Filled 2021-02-22: qty 20000

## 2021-02-22 MED ORDER — DEXAMETHASONE SODIUM PHOSPHATE 10 MG/ML IJ SOLN
INTRAMUSCULAR | Status: DC | PRN
  Administered 2021-02-22: 10 mg via INTRAVENOUS

## 2021-02-22 MED ORDER — LABETALOL HCL 5 MG/ML IV SOLN
10.0000 mg | INTRAVENOUS | Status: DC | PRN
  Administered 2021-02-22 (×2): 20 mg via INTRAVENOUS
  Filled 2021-02-22: qty 8

## 2021-02-22 MED ORDER — FENTANYL CITRATE (PF) 100 MCG/2ML IJ SOLN
100.0000 ug | Freq: Once | INTRAMUSCULAR | Status: AC
  Filled 2021-02-22: qty 2

## 2021-02-22 MED ORDER — THROMBIN 5000 UNITS EX SOLR: OROMUCOSAL | Status: DC | PRN

## 2021-02-22 MED ORDER — ONDANSETRON HCL 4 MG PO TABS
4.0000 mg | ORAL_TABLET | ORAL | Status: DC | PRN
  Administered 2021-02-24: 4 mg via ORAL
  Filled 2021-02-22: qty 1

## 2021-02-22 MED ORDER — LIDOCAINE-EPINEPHRINE 1 %-1:100000 IJ SOLN
INTRAMUSCULAR | Status: AC
  Filled 2021-02-22: qty 1

## 2021-02-22 MED ORDER — SUGAMMADEX SODIUM 200 MG/2ML IV SOLN
INTRAVENOUS | Status: DC | PRN
Start: 2021-02-22 — End: 2021-02-22
  Administered 2021-02-22: 200 mg via INTRAVENOUS

## 2021-02-22 MED ORDER — DEXAMETHASONE SODIUM PHOSPHATE 10 MG/ML IJ SOLN
6.0000 mg | Freq: Four times a day (QID) | INTRAMUSCULAR | Status: AC
  Administered 2021-02-22 – 2021-02-23 (×4): 6 mg via INTRAVENOUS
  Filled 2021-02-22 (×4): qty 1

## 2021-02-22 MED ORDER — THROMBIN 5000 UNITS EX SOLR
CUTANEOUS | Status: AC
  Filled 2021-02-22: qty 5000

## 2021-02-22 MED ORDER — CHLORHEXIDINE GLUCONATE CLOTH 2 % EX PADS: 6.0000 | MEDICATED_PAD | Freq: Once | CUTANEOUS | Status: DC

## 2021-02-22 MED ORDER — BUSPIRONE HCL 10 MG PO TABS
10.0000 mg | ORAL_TABLET | Freq: Two times a day (BID) | ORAL | Status: DC
  Administered 2021-02-22 – 2021-02-27 (×10): 10 mg via ORAL
  Filled 2021-02-22 (×10): qty 1

## 2021-02-22 MED ORDER — FENTANYL CITRATE (PF) 250 MCG/5ML IJ SOLN
INTRAMUSCULAR | Status: AC
  Filled 2021-02-22: qty 5

## 2021-02-22 MED ORDER — 0.9 % SODIUM CHLORIDE (POUR BTL) OPTIME
TOPICAL | Status: DC | PRN
  Administered 2021-02-22: 3000 mL

## 2021-02-22 MED ORDER — BUSPIRONE HCL 15 MG PO TABS: 15.0000 mg | ORAL_TABLET | Freq: Two times a day (BID) | ORAL | Status: DC

## 2021-02-22 SURGICAL SUPPLY — 45 items
BAG COUNTER SPONGE SURGICOUNT (BAG) ×3 IMPLANT
BAG SPNG CNTER NS LX DISP (BAG) ×2
BLADE CLIPPER SPEC (BLADE) ×3 IMPLANT
BUR SPIRAL ROUTER 2.3 (BUR) ×3 IMPLANT
CANISTER SUCT 3000ML PPV (MISCELLANEOUS) ×6 IMPLANT
CNTNR URN SCR LID CUP LEK RST (MISCELLANEOUS) ×4 IMPLANT
CONT SPEC 4OZ STRL OR WHT (MISCELLANEOUS) ×6
DRAPE NEUROLOGICAL W/INCISE (DRAPES) ×3 IMPLANT
DRAPE WARM FLUID 44X44 (DRAPES) ×3 IMPLANT
DURAPREP 26ML APPLICATOR (WOUND CARE) ×3 IMPLANT
ELECT REM PT RETURN 9FT ADLT (ELECTROSURGICAL) ×3
ELECTRODE REM PT RTRN 9FT ADLT (ELECTROSURGICAL) ×2 IMPLANT
FORCEPS BIPOLAR SPETZLER 8 1.0 (NEUROSURGERY SUPPLIES) ×3 IMPLANT
GAUZE 4X4 16PLY ~~LOC~~+RFID DBL (SPONGE) ×3 IMPLANT
GAUZE SPONGE 4X4 12PLY STRL (GAUZE/BANDAGES/DRESSINGS) ×3 IMPLANT
GLOVE SURG POLYISO LF SZ6.5 (GLOVE) ×9 IMPLANT
GLOVE SURG POLYISO LF SZ7 (GLOVE) ×6 IMPLANT
GLOVE SURG POLYISO LF SZ8 (GLOVE) ×6 IMPLANT
GOWN STRL REUS W/ TWL LRG LVL3 (GOWN DISPOSABLE) IMPLANT
GOWN STRL REUS W/ TWL XL LVL3 (GOWN DISPOSABLE) IMPLANT
GOWN STRL REUS W/TWL 2XL LVL3 (GOWN DISPOSABLE) ×3 IMPLANT
GOWN STRL REUS W/TWL LRG LVL3 (GOWN DISPOSABLE)
GOWN STRL REUS W/TWL XL LVL3 (GOWN DISPOSABLE)
HEMOSTAT POWDER KIT SURGIFOAM (HEMOSTASIS) ×3 IMPLANT
HEMOSTAT SURGICEL 2X14 (HEMOSTASIS) ×3 IMPLANT
KIT BASIN OR (CUSTOM PROCEDURE TRAY) ×3 IMPLANT
KIT TURNOVER KIT B (KITS) ×3 IMPLANT
MARKER SPHERE PSV REFLC 13MM (MARKER) ×6 IMPLANT
NEEDLE HYPO 22GX1.5 SAFETY (NEEDLE) ×3 IMPLANT
NS IRRIG 1000ML POUR BTL (IV SOLUTION) ×9 IMPLANT
PACK CRANIOTOMY CUSTOM (CUSTOM PROCEDURE TRAY) ×3 IMPLANT
PERFORATOR LRG  14-11MM (BIT) ×3
PERFORATOR LRG 14-11MM (BIT) ×2 IMPLANT
PIN MAYFIELD SKULL DISP (PIN) ×3 IMPLANT
PLATE BONE 12 2H TARGET XL (Plate) ×9 IMPLANT
SCREW UNIII AXS SD 1.5X4 (Screw) ×18 IMPLANT
SPONGE SURGIFOAM ABS GEL 100 (HEMOSTASIS) ×3 IMPLANT
STAPLER VISISTAT 35W (STAPLE) ×3 IMPLANT
SUT ETHILON 3 0 FSL (SUTURE) IMPLANT
SUT NURALON 4 0 TR CR/8 (SUTURE) ×6 IMPLANT
SUT VIC AB 2-0 CP2 18 (SUTURE) ×3 IMPLANT
TAPE CLOTH SURG 4X10 WHT LF (GAUZE/BANDAGES/DRESSINGS) ×3 IMPLANT
TOWEL GREEN STERILE (TOWEL DISPOSABLE) ×3 IMPLANT
TOWEL GREEN STERILE FF (TOWEL DISPOSABLE) ×3 IMPLANT
WATER STERILE IRR 1000ML POUR (IV SOLUTION) ×3 IMPLANT

## 2021-02-22 NOTE — H&P (Signed)
Subjective: Patient is a 75 y.o. female admitted for craniotomy for tumor. Onset of symptoms was 1 week ago, unchanged since that time.  The pain is rated mild, as she does have some headache.  The pain is described as aching and occurs intermittently. T she presented emergency department last week with seizures.  Imaging showed an enhancing mass in the left posterior parietal region.  Recommended craniotomy for resection.  She presents today for that.  Symptoms are exacerbated by nothing in particular. MRI or CT showed enhancing mass in the left posterior parietal region with surrounding edema discerning for primary brain tumor  Past Medical History:  Diagnosis Date   Allergy    Anemia    Anxiety and depression 12/19/2009   Arthritis    Cancer ([place])    vaginal   Constipation    Degenerative disorder of eye    Diverticulosis    Dysrhythmia 01/2021   Atrial fib/flutter   Essential hypertension, benign 06/26/2009   GERD 05/27/2007   takes Nexium daily   Headache    Heart murmur    yrs ago   History of gout    History of migraine many yrs ago   Hyperlipidemia    takes Pravastatin daily   Hypothyroidism    Insomnia    but doesn't take any meds   LOW BACK PAIN 05/27/2007   Numbness    left toes    Osteoporosis    Pneumonia    Seizures ([place])    last 1983, none since then- last seizure 1988 per pt    Sleep apnea    wears CPAP    Past Surgical History:  Procedure Laterality Date   ADENOIDECTOMY     BACK SURGERY     lumbar x2   BRONCHIAL BIOPSY  09/12/2019   Procedure: BRONCHIAL BIOPSIES;  Surgeon: Laurin Coder, MD;  Location: [facility];  Service: Endoscopy;;   BRONCHIAL WASHINGS  09/12/2019   Procedure: BRONCHIAL WASHINGS;  Surgeon: Laurin Coder, MD;  Location: [facility];  Service: Endoscopy;;   COLONOSCOPY  2009   ESOPHAGOGASTRODUODENOSCOPY     Heel spurs Bilateral    HEMOSTASIS CONTROL  09/12/2019   Procedure: HEMOSTASIS CONTROL;  Surgeon: Laurin Coder, MD;  Location: [facility];  Service: Endoscopy;;  epi   KNEE ARTHROPLASTY Left 11/07/2013   Procedure: COMPUTER ASSISTED TOTAL KNEE ARTHROPLASTY;  Surgeon: Marybelle Killings, MD;  Location: [place];  Service: Orthopedics;  Laterality: Left;  Left Total Knee Arthroplasty, Cemented, Computer Assist   TONSILLECTOMY     TOTAL KNEE REVISION Left 09/19/2014   Procedure: LEFT TOTAL KNEE REVISION;  Surgeon: Leandrew Koyanagi, MD;  Location: [place];  Service: Orthopedics;  Laterality: Left;   UPPER GASTROINTESTINAL ENDOSCOPY     VIDEO BRONCHOSCOPY N/A 09/12/2019   Procedure: VIDEO BRONCHOSCOPY WITH FLUORO;  Surgeon: Laurin Coder, MD;  Location: [facility];  Service: Endoscopy;  Laterality: N/A;    Prior to Admission medications   Medication Sig Start Date End Date Taking? Authorizing Provider  albuterol (VENTOLIN HFA) 108 (90 Base) MCG/ACT inhaler Inhale 1-2 puffs into the lungs every 6 (six) hours as needed for wheezing or shortness of breath. 05/11/20  Yes Providence Lanius A, PA-C  busPIRone (BUSPAR) 15 MG tablet Take 15 mg by mouth 2 (two) times daily.   Yes [provider]  cholecalciferol (VITAMIN D3) 25 MCG (1000 UNIT) tablet Take 1,000 Units by mouth every morning.   Yes [provider]  dexamethasone (DECADRON)  4 MG tablet Take 1 tablet (4 mg total) by mouth 4 (four) times daily for 14 days. 02/18/21 03/04/21 Yes Oswald Hillock, MD  esomeprazole (NEXIUM) 20 MG capsule Take 2 capsules (40 mg total) by mouth daily before breakfast. Patient taking differently: Take 20 mg by mouth daily before breakfast. 03/21/19  Yes Laurey Morale, MD  ferrous sulfate 325 (65 FE) MG tablet Take 325 mg by mouth 3 (three) times a week.   Yes [provider]  levocetirizine (XYZAL) 5 MG tablet Take 5 mg by mouth at bedtime. 03/05/16  Yes [provider]  levothyroxine (SYNTHROID) 88 MCG tablet Take 1 tablet (88 mcg total) by mouth daily. Patient taking differently: Take 88 mcg by mouth  daily before breakfast. 10/11/20  Yes Philemon Kingdom, MD  lisinopril (ZESTRIL) 5 MG tablet TAKE 1 TABLET BY MOUTH EVERY DAY Patient taking differently: Take 5 mg by mouth every morning. 10/19/20  Yes Koberlein, Junell C, MD  pravastatin (PRAVACHOL) 20 MG tablet TAKE 1 TABLET (20 MG TOTAL) BY MOUTH DAILY. Patient taking differently: Take 20 mg by mouth at bedtime. 10/19/20  Yes Koberlein, Junell C, MD  sertraline (ZOLOFT) 100 MG tablet Take 2 tablets (200 mg total) by mouth daily. Patient taking differently: Take 100 mg by mouth 2 (two) times daily. 09/04/20  Yes Donnal Moat T, PA-C  solifenacin (VESICARE) 10 MG tablet Take 10 mg by mouth at bedtime. 01/24/21  Yes [provider]  vitamin B-12 (CYANOCOBALAMIN) 1000 MCG tablet Take 1,000 mcg by mouth every morning.   Yes [provider]  zonisamide (ZONEGRAN) 100 MG capsule Take 2 capsules twice a day Patient taking differently: Take 200 mg by mouth 2 (two) times daily. Seizure medication 11/07/20  Yes Cameron Sprang, MD  Budeson-Glycopyrrol-Formoterol (BREZTRI AEROSPHERE) 160-9-4.8 MCG/ACT AERO Inhale 2 puffs into the lungs 2 (two) times daily. Patient not taking: No sig reported 12/04/20   Caren Macadam, MD  busPIRone (BUSPAR) 10 MG tablet TAKE 1 TABLET BY MOUTH TWICE A DAY 02/18/21   Hurst, Teresa T, PA-C  EPINEPHrine 0.3 mg/0.3 mL IJ SOAJ injection Inject 0.3 mg into the muscle once as needed for anaphylaxis. 09/14/19   [provider]  furosemide (LASIX) 20 MG tablet TAKE 1 TABLET BY MOUTH IN THE MORNING Patient not taking: No sig reported 01/23/20   Mannam, Praveen, MD  hyoscyamine (LEVSIN SL) 0.125 MG SL tablet Place 1-2 tablets (0.125-0.25 mg total) under the tongue every 6 (six) hours as needed. Patient not taking: No sig reported 05/15/20   Yetta Flock, MD  ondansetron (ZOFRAN ODT) 4 MG disintegrating tablet Take 1 tablet (4 mg total) by mouth every 6 (six) hours as needed for nausea or  vomiting. Patient not taking: No sig reported 03/30/19   Caren Macadam, MD  Spacer/Aero-Holding Chambers (AEROCHAMBER PLUS) inhaler Use as instructed 11/16/20   Caren Macadam, MD   Allergies  Allergen Reactions   Lamotrigine Swelling    Tongue swelling   Bupropion Other (See Comments)    Unknown reaction   Carbamazepine Other (See Comments)    Hypnatremia   Levetiracetam Other (See Comments)    dizziness   Tramadol Other (See Comments)    Unknown reaction   Adhesive [Tape] Other (See Comments)    ?  Blister after knee surgery   Amitriptyline Nausea And Vomiting   Clarithromycin Other (See Comments)    Unknown reaction     Doxycycline Other (See Comments)    Interfered  with seizure medication   Nickel Other (See Comments)    Had to remove knee implant with nickel and replace it   Other Other (See Comments)    Allergic to Metal and perfumes (unknown reaction)   Topamax [Topiramate] Nausea And Vomiting   Estradiol Rash   Latex Rash   Phenytoin Sodium Extended Other (See Comments)    drowsiness    Social History   Tobacco Use   Smoking status: Former    Packs/day: 0.25    Years: 20.00    Pack years: 5.00    Types: Cigarettes    Quit date: 09/08/1978    Years since quitting: 42.4   Smokeless tobacco: Never   Tobacco comments:    quit smoking 20yrs ago  Substance Use Topics   Alcohol use: No    Family History  Problem Relation Age of Onset   Aortic dissection Father    Arthritis Mother    Stroke Mother 37   Heart murmur Other    Throat cancer Sister    Colon cancer Neg Hx    Esophageal cancer Neg Hx    Stomach cancer Neg Hx    Rectal cancer Neg Hx    Colon polyps Neg Hx      Review of Systems  Positive ROS: Negative  All other systems have been reviewed and were otherwise negative with the exception of those mentioned in the HPI and as above.  Objective: Vital signs in last 24 hours: Temp:  [98.3 F (36.8 C)] 98.3 F (36.8 C) (11/04  0804) Pulse Rate:  [52-66] 54 (11/04 1121) Resp:  [11-17] 17 (11/04 1121) BP: (119-163)/(43-52) 119/43 (11/04 1121) SpO2:  [97 %-100 %] 97 % (11/04 1121) Weight:  [104.1 kg] 104.1 kg (11/04 0804)  General Appearance: Alert, cooperative, no distress, appears stated age Head: Normocephalic, without obvious abnormality, atraumatic Eyes: PERRL, conjunctiva/corneas clear, EOM's intact    Neck: Supple, symmetrical, trachea midline Back: Symmetric, no curvature, ROM normal, no CVA tenderness Lungs:  respirations unlabored Heart: Regular rate and rhythm Abdomen: Soft, non-tender Extremities: Extremities normal, atraumatic, no cyanosis or edema Pulses: 2+ and symmetric all extremities Skin: Skin color, texture, turgor normal, no rashes or lesions  NEUROLOGIC:   Mental status: Alert and oriented x4,  no aphasia, good attention span, fund of knowledge, and memory Motor Exam - grossly normal Sensory Exam - grossly normal Reflexes: 1+ Coordination - grossly normal Gait - grossly normal Balance - grossly normal Cranial Nerves: I: smell Not tested  II: visual acuity  OS: nl    OD: nl  II: visual fields Full to confrontation  II: pupils Equal, round, reactive to light  III,VII: ptosis None  III,IV,VI: extraocular muscles  Full ROM  V: mastication Normal  V: facial light touch sensation  Normal  V,VII: corneal reflex  Present  VII: facial muscle function - upper  Normal  VII: facial muscle function - lower Normal  VIII: hearing Not tested  IX: soft palate elevation  Normal  IX,X: gag reflex Present  XI: trapezius strength  5/5  XI: sternocleidomastoid strength 5/5  XI: neck flexion strength  5/5  XII: tongue strength  Normal    Data Review Lab Results  Component Value Date   WBC 8.0 02/18/2021   HGB 12.2 02/18/2021   HCT 37.9 02/18/2021   MCV 97.2 02/18/2021   PLT 178 02/18/2021   Lab Results  Component Value Date   NA 139 02/18/2021   K 4.2 02/18/2021   CL 112 (  H)  02/18/2021   CO2 23 02/18/2021   BUN 10 02/18/2021   CREATININE 0.85 02/18/2021   GLUCOSE 105 (H) 02/18/2021   Lab Results  Component Value Date   INR 1.1 (H) 09/09/2019    Assessment/Plan:  Estimated body mass index is 38.19 kg/m as calculated from the following:   Height as of this encounter: 5\' 5"  (1.651 m).   Weight as of this encounter: 104.1 kg. Patient admitted for craniotomy for tumor  I explained the condition and procedure to the patient and answered any questions.  Patient wishes to proceed with procedure as planned. Understands risks/ benefits and typical outcomes of procedure.   Eustace Moore 02/22/2021 1:24 PM

## 2021-02-22 NOTE — Transfer of Care (Signed)
Immediate Anesthesia Transfer of Care Note  Patient: Beverly Ballard  Procedure(s) Performed: CRANIOTOMY FOR TUMOR EXCISION (Head) APPLICATION OF CRANIAL NAVIGATION  Patient Location: PACU  Anesthesia Type:General  Level of Consciousness: awake, alert  and oriented  Airway & Oxygen Therapy: Patient Spontanous Breathing and Patient connected to nasal cannula oxygen  Post-op Assessment: Report given to RN and Post -op Vital signs reviewed and stable  Post vital signs: Reviewed and stable  Last Vitals:  Vitals Value Taken Time  BP 138/65 02/22/21 1625  Temp    Pulse 72 02/22/21 1629  Resp 19 02/22/21 1629  SpO2 94 % 02/22/21 1629  Vitals shown include unvalidated device data.  Last Pain:  Vitals:   02/22/21 0837  TempSrc:   PainSc: 0-No pain     Cleviprex gtt infusing at 4 mg/hr - SBP goals within Jones MD ordered range of 120-140 mmHg, patient moves all extremities, VSS.    Complications: No notable events documented.

## 2021-02-22 NOTE — Anesthesia Procedure Notes (Signed)
Central Venous Catheter Insertion Performed by: Roderic Palau, MD, anesthesiologist Start/End11/07/2020 10:00 AM, 02/22/2021 10:15 AM Patient location: Pre-op. Preanesthetic checklist: patient identified, IV checked, site marked, risks and benefits discussed, surgical consent, monitors and equipment checked, pre-op evaluation, timeout performed and anesthesia consent Position: Trendelenburg Lidocaine 1% used for infiltration and patient sedated Hand hygiene performed , maximum sterile barriers used  and Seldinger technique used Catheter size: 8 Fr Total catheter length 16. Central line was placed.Double lumen Procedure performed using ultrasound guided technique. Ultrasound Notes:anatomy identified, needle tip was noted to be adjacent to the nerve/plexus identified, no ultrasound evidence of intravascular and/or intraneural injection and image(s) printed for medical record Attempts: 1 Following insertion, dressing applied, line sutured and Biopatch. Post procedure assessment: blood return through all ports  Patient tolerated the procedure well with no immediate complications.

## 2021-02-22 NOTE — Anesthesia Procedure Notes (Signed)
Arterial Line Insertion Start/End11/07/2020 9:50 AM, 02/22/2021 10:00 AM Performed by: Harden Mo, CRNA, CRNA  Patient location: Pre-op. Preanesthetic checklist: patient identified, IV checked, site marked, risks and benefits discussed, surgical consent, monitors and equipment checked, pre-op evaluation and anesthesia consent Lidocaine 1% used for infiltration Right, radial was placed Catheter size: 20 G Hand hygiene performed  and maximum sterile barriers used   Attempts: 1 Procedure performed without using ultrasound guided technique. Following insertion, dressing applied and Biopatch. Post procedure assessment: normal and unchanged  Patient tolerated the procedure well with no immediate complications.

## 2021-02-22 NOTE — Progress Notes (Signed)
Patient refused CPAP tonight. Pt stated she will have her family bring her home cpap tomorrow.

## 2021-02-22 NOTE — Anesthesia Preprocedure Evaluation (Addendum)
Anesthesia Evaluation  Patient identified by MRN, date of birth, ID band Patient awake    Reviewed: Allergy & Precautions, H&P , NPO status , Patient's Chart, lab work & pertinent test results  Airway Mallampati: II  TM Distance: >3 FB Neck ROM: Full    Dental no notable dental hx. (+) Teeth Intact, Dental Advisory Given   Pulmonary sleep apnea and Continuous Positive Airway Pressure Ventilation , former smoker,    Pulmonary exam normal breath sounds clear to auscultation       Cardiovascular hypertension, Pt. on medications  Rhythm:Regular Rate:Normal     Neuro/Psych  Headaches, Seizures -, Poorly Controlled,  Anxiety Depression negative neurological ROS     GI/Hepatic Neg liver ROS, GERD  Medicated,  Endo/Other  Hypothyroidism Morbid obesity  Renal/GU negative Renal ROS  negative genitourinary   Musculoskeletal  (+) Arthritis , Osteoarthritis,    Abdominal   Peds  Hematology  (+) Blood dyscrasia, anemia ,   Anesthesia Other Findings   Reproductive/Obstetrics negative OB ROS                            Anesthesia Physical Anesthesia Plan  ASA: 3  Anesthesia Plan: General   Post-op Pain Management:    Induction: Intravenous  PONV Risk Score and Plan: 4 or greater and Ondansetron, Dexamethasone and Treatment may vary due to age or medical condition  Airway Management Planned: Oral ETT  Additional Equipment: Arterial line, CVP and Ultrasound Guidance Line Placement  Intra-op Plan:   Post-operative Plan: Extubation in OR  Informed Consent: I have reviewed the patients History and Physical, chart, labs and discussed the procedure including the risks, benefits and alternatives for the proposed anesthesia with the patient or authorized representative who has indicated his/her understanding and acceptance.     Dental advisory given  Plan Discussed with: CRNA  Anesthesia Plan  Comments:        Anesthesia Quick Evaluation

## 2021-02-22 NOTE — Op Note (Signed)
02/22/2021  4:19 PM  PATIENT:  Beverly Ballard  75 y.o. female  PRE-OPERATIVE DIAGNOSIS: Left posterior parietal enhancing brain mass  POST-OPERATIVE DIAGNOSIS:  same, frozen consistent with primary brain tumor  PROCEDURE: Left posterior parietal craniotomy for resection of brain mass utilizing frameless stereotactic navigation  SURGEON:  Sherley Bounds, MD  ASSISTANTS: Glenford Peers FNP  ANESTHESIA:   General  EBL: 100 ml  Total I/O In: 400 [I.V.:400] Out: 600 [Urine:500; Blood:100]  BLOOD ADMINISTERED: none  DRAINS: None in  SPECIMEN:  none  INDICATION FOR PROCEDURE: This patient presented with seizures. Imaging showed an enhancing brain mass in the left posterior parietal region.  Recommended craniotomy for resection of the tumor. Patient understood the risks, benefits, and alternatives and potential outcomes and wished to proceed.  PROCEDURE DETAILS: The patient was taken to the operating room and after induction of adequate generalized endotracheal anesthesia, the head was affixed in a 3 point [place] head rest, and turned into the prone position to expose the left posterior parietal and occipital region.  We used Retail buyer and registered our landmarks.  We checked for accuracy.  The head was shaved and then cleaned and then prepped with DuraPrep and draped in the usual sterile fashion. 10 cc of local anesthetic was injected, and a curvilinear incision was made on the left of the head about 3 cm lateral to the sagittal sinus. Raney clips were placed to establish hemostasis of the scalp, the muscle was reflected with the scalp flap, to expose the left parieto-occipital region. 2 burr holes was placed just lateral to the sagittal sinus, and a craniotomy flap was turned utilizing the high-speed, air powered drill. The flap was then placed in bacitracin-containing saline solution, and the dura was opened to expose the underlying brain.  Used navigation to plan  our approach to the lesion A small corticectomy was created with the bipolar forceps, we dissected gently few millimeters deep and the underlying tumor was then identified.  We sent several pieces of the mass to pathology and frozen was consistent with primary brain mass.  The tumor was then removed by bipolar cautery and suction.  We made a second and a third corticectomy in order to create a wedge resection and since this was a multifocal tumor.  Obviously, the 3 satellite lesions were tiny and difficult to identify and therefore we decided to move forward with wedge resection.  We used navigation to help guide our wedge resection.  Once the tumor was felt to be removed I dried the surgical bed with bipolar cautery and  Surgifoam, and then lined the surgical bed with Surgicel. I then  closed the dura with a interrupted 4-0 Nurolon suture. The dura was lined with Gelfoam, and the craniotomy flap was replaced with doggie-bone plates. The wound was copiously irrigated. the galea was then closed with interrupted 2-0 Vicryl suture. The skin was then closed with staples a sterile dressing was applied. The patient was then taken out of the 3-point Mayfield headrest and awakened from general anesthesia, and transported to the recovery room in stable condition. At the end of the procedure all sponge, needle, and instrument counts were correct.  My nurse practitioner was scrubbed and assisted the entirety of the case.    PLAN OF CARE: Admit to inpatient   PATIENT DISPOSITION:  PACU - hemodynamically stable.   Delay start of Pharmacological VTE agent (>24hrs) due to surgical blood loss or risk of bleeding:  yes

## 2021-02-22 NOTE — Anesthesia Procedure Notes (Signed)
Procedure Name: Intubation Date/Time: 02/22/2021 1:59 PM Performed by: Ardyth Harps, CRNA Pre-anesthesia Checklist: Patient identified, Emergency Drugs available, Suction available and Patient being monitored Patient Re-evaluated:Patient Re-evaluated prior to induction Oxygen Delivery Method: Circle System Utilized Preoxygenation: Pre-oxygenation with 100% oxygen Induction Type: IV induction Ventilation: Mask ventilation without difficulty Laryngoscope Size: Mac and 3 Grade View: Grade I Tube type: Oral Tube size: 7.0 mm Number of attempts: 1 Airway Equipment and Method: Stylet and Oral airway Placement Confirmation: ETT inserted through vocal cords under direct vision, positive ETCO2 and breath sounds checked- equal and bilateral Secured at: 21 cm Tube secured with: Tape Dental Injury: Teeth and Oropharynx as per pre-operative assessment

## 2021-02-23 ENCOUNTER — Inpatient Hospital Stay (HOSPITAL_COMMUNITY): Payer: Medicare HMO

## 2021-02-23 MED ORDER — GADOBUTROL 1 MMOL/ML IV SOLN
10.0000 mL | Freq: Once | INTRAVENOUS | Status: AC | PRN
  Administered 2021-02-23: 10 mL via INTRAVENOUS

## 2021-02-23 NOTE — Evaluation (Signed)
Physical Therapy Evaluation Patient Details Name: Beverly Ballard MRN: [id number] DOB: 09-16-1945 Today's Date: 02/23/2021  History of Present Illness  75 y.o. 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  Pt with significant PMH of anemia, arthritis (L TKA and revision), CA (vaginal), dgenerative disorder of the eye, gout, migraine, low back pain (lumbar surgery x 2), insomnia, seizure, and diverticulosis.  Clinical Impression  Pt was able to walk in the hallway with min assist overall and RW.  She does show signs of peripheral vision deficits consistently running into obstacles on her right side.  She has a supportive family and can likely go home with [facility] PT follow up safely at discharge.  I have ordered OT to help assess vision and cognition related to safety with ADLs, medication management, etc.   PT to follow acutely for deficits listed below.        Recommendations for follow up therapy are one component of a multi-disciplinary discharge planning process, led by the attending physician.  Recommendations may be updated based on patient status, additional functional criteria and insurance authorization.  Follow Up Recommendations Home health PT    Assistance Recommended at Discharge Frequent or constant Supervision/Assistance  Functional Status Assessment Patient has had a recent decline in their functional status and demonstrates the ability to make significant improvements in function in a reasonable and predictable amount of time.  Equipment Recommendations  None recommended by PT    Recommendations for Other Services OT consult     Precautions / Restrictions Precautions Precautions: Fall Precaution Comments: pt reports h/o frequent falls, seems to have some peripheral vision deficits      Mobility  Bed Mobility               General bed mobility comments: Pt was OOB in the recliner chair.    Transfers Overall transfer level:  Needs assistance Equipment used: Rolling walker (2 wheels) Transfers: Sit to/from Stand Sit to Stand: Min assist           General transfer comment: Min assist from low recliner chair, min guard from higher commode, reliance on hands and momentum/rocking for coming to stand, uncontrolled "plop" descent to sit, cues for safe hand placement during transitions with RW use.    Ambulation/Gait Ambulation/Gait assistance: Min assist Gait Distance (Feet): 20 Feet (20'x1 100'x1) Assistive device: Rolling walker (2 wheels) Gait Pattern/deviations: Step-through pattern;Staggering left;Staggering right Gait velocity: decreased Gait velocity interpretation: 1.31 - 2.62 ft/sec, indicative of limited community ambulator General Gait Details: Pt did tend to run into things consistently on her right (therapist on her left, so unsure if she would do the same on the left if I switched sides).  Stairs            Wheelchair Mobility    Modified Rankin (Stroke Patients Only)       Balance Overall balance assessment: Needs assistance Sitting-balance support: Feet supported;No upper extremity supported Sitting balance-Leahy Scale: Good     Standing balance support: Bilateral upper extremity supported;Single extremity supported;No upper extremity supported Standing balance-Leahy Scale: Poor Standing balance comment: reliant on at least one UE supported.                             Pertinent Vitals/Pain Pain Assessment: Faces Faces Pain Scale: Hurts little more Pain Location: head Pain Descriptors / Indicators: Aching Pain Intervention(s): Limited activity within patient's tolerance;Monitored during session;Repositioned    Home  Living Family/patient expects to be discharged to:: Private residence Living Arrangements: Spouse/significant other Available Help at Discharge: Family;Available 24 hours/day Type of Home: House Home Access: Stairs to enter Entrance Stairs-Rails:  Psychiatric nurse of Steps: 5   Home Layout: One level Home Equipment: Conservation officer, nature (2 wheels);Kasandra Knudsen - single point Additional Comments: from [place], but has been in the [place] for >50 years    Prior Function Prior Level of Function : Independent/Modified Independent             Mobility Comments: walks with a cane, mostly in the community not in the house, drives, does her own bathing and dressing, housework and cooking.  Does not work, manages her own mediciations.       Hand Dominance   Dominant Hand: Right    Extremity/Trunk Assessment   Upper Extremity Assessment Upper Extremity Assessment: Defer to OT evaluation    Lower Extremity Assessment Lower Extremity Assessment: RLE deficits/detail;LLE deficits/detail RLE Deficits / Details: strength grossly intact. RLE Sensation: decreased light touch (difficulty locating LT) LLE Deficits / Details: strength grossly intact LLE Sensation: decreased light touch (difficulty locating light touch)    Cervical / Trunk Assessment Cervical / Trunk Assessment: Other exceptions;Back Surgery Cervical / Trunk Exceptions: h/o lumbar surgery x 2  Communication   Communication: No difficulties  Cognition Arousal/Alertness: Awake/alert Behavior During Therapy: WFL for tasks assessed/performed Overall Cognitive Status: Impaired/Different from baseline Area of Impairment: Following commands;Safety/judgement;Awareness;Problem solving;Memory                     Memory: Decreased short-term memory Following Commands: Follows one step commands with increased time Safety/Judgement: Decreased awareness of safety;Decreased awareness of deficits Awareness: Intellectual Problem Solving: Slow processing;Decreased initiation;Requires verbal cues;Difficulty sequencing;Requires tactile cues General Comments: Pt slow to process questions and at times seems to struggle with retrieval of words.  STM deficits cannot do 3 word  recall at 8 mins or 5 mins.  Daughter reports she may be just a bit worse than usual, but close to baseline.        General Comments      Exercises     Assessment/Plan    PT Assessment Patient needs continued PT services  PT Problem List Decreased activity tolerance;Decreased mobility;Decreased balance;Decreased cognition;Decreased knowledge of use of DME;Decreased safety awareness;Decreased knowledge of precautions;Pain;Impaired sensation;Obesity       PT Treatment Interventions DME instruction;Gait training;Stair training;Functional mobility training;Therapeutic activities;Therapeutic exercise;Balance training;Patient/family education;Cognitive remediation;Neuromuscular re-education    PT Goals (Current goals can be found in the Care Plan section)  Acute Rehab PT Goals Patient Stated Goal: to stop falling so much PT Goal Formulation: With patient Time For Goal Achievement: 03/09/21 Potential to Achieve Goals: Good    Frequency Min 4X/week   Barriers to discharge        Co-evaluation               AM-PAC PT "6 Clicks" Mobility  Outcome Measure Help needed turning from your back to your side while in a flat bed without using bedrails?: A Little Help needed moving from lying on your back to sitting on the side of a flat bed without using bedrails?: A Little Help needed moving to and from a bed to a chair (including a wheelchair)?: A Little Help needed standing up from a chair using your arms (e.g., wheelchair or bedside chair)?: A Little Help needed to walk in hospital room?: A Little Help needed climbing 3-5 steps with a railing? : A Lot 6  Click Score: 17    End of Session Equipment Utilized During Treatment: Gait belt Activity Tolerance: Patient limited by pain;Patient limited by fatigue Patient left: in chair;with call bell/phone within reach;with family/visitor present Nurse Communication: Mobility status PT Visit Diagnosis: Muscle weakness (generalized)  (M62.81);Difficulty in walking, not elsewhere classified (R26.2);Repeated falls (R29.6);Other symptoms and signs involving the nervous system (R29.898)    Time: 4383-7793 PT Time Calculation (min) (ACUTE ONLY): 42 min   Charges:   PT Evaluation $PT Eval Moderate Complexity: 1 Mod PT Treatments $Gait Training: 8-22 mins $Therapeutic Activity: 8-22 mins        Verdene Lennert, PT, DPT  Acute Rehabilitation Ortho Tech Supervisor [phone] pager [phone]) [phone] office

## 2021-02-23 NOTE — Progress Notes (Signed)
Patient refused CPAP.

## 2021-02-23 NOTE — Progress Notes (Signed)
Postop day 1.  Patient complains of moderate headache.  No other complaints.  She is awake and alert.  She is oriented and appropriate.  Her speech is fluent.  Patient with a little bit of right visual field loss but overall vision is good.  Motor 5/5 bilaterally.  No drift.  Progressing well following craniotomy for tumor.  Begin to mobilize.  Follow-up MRI scan pending.

## 2021-02-23 NOTE — Anesthesia Postprocedure Evaluation (Signed)
Anesthesia Post Note  Patient: Beverly Ballard  Procedure(s) Performed: CRANIOTOMY FOR TUMOR EXCISION (Head) APPLICATION OF CRANIAL NAVIGATION     Patient location during evaluation: Other Anesthesia Type: General Level of consciousness: awake and alert Pain management: pain level controlled Vital Signs Assessment: post-procedure vital signs reviewed and stable Respiratory status: spontaneous breathing, nonlabored ventilation and respiratory function stable Cardiovascular status: blood pressure returned to baseline and stable Postop Assessment: no apparent nausea or vomiting Anesthetic complications: no   No notable events documented.  Last Vitals:  Vitals:   02/23/21 1000 02/23/21 1100  BP: 103/89 (!) 124/56  Pulse: 68 (!) 56  Resp: 18 17  Temp:    SpO2: 99% 97%    Last Pain:  Vitals:   02/23/21 0813  TempSrc:   PainSc: 5                  Dequandre Cordova,W. EDMOND

## 2021-02-24 MED ORDER — DEXAMETHASONE 4 MG PO TABS
4.0000 mg | ORAL_TABLET | Freq: Three times a day (TID) | ORAL | Status: DC
  Administered 2021-02-24 – 2021-02-27 (×9): 4 mg via ORAL
  Filled 2021-02-24 (×9): qty 1

## 2021-02-24 MED ORDER — PANTOPRAZOLE SODIUM 40 MG PO TBEC
40.0000 mg | DELAYED_RELEASE_TABLET | Freq: Every day | ORAL | Status: DC
  Administered 2021-02-24 – 2021-02-26 (×3): 40 mg via ORAL
  Filled 2021-02-24 (×3): qty 1

## 2021-02-24 NOTE — Evaluation (Signed)
Occupational Therapy Evaluation Patient Details Name: Beverly Ballard MRN: [id number] DOB: 01-Feb-1946 Today's Date: 02/24/2021   History of Present Illness 75 y.o. 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  Pt with significant PMH of anemia, arthritis (L TKA and revision), CA (vaginal), dgenerative disorder of the eye, gout, migraine, low back pain (lumbar surgery x 2), insomnia, seizure, and diverticulosis.   Clinical Impression   Pt PTA: pt living with spouse and reports independence with ADL and mobility; some cognitive deficits at baseline. Pt currently, performed Short Blessed Test and was able to pass with increased time. Pt limited by R peripheral visual impairment and weakness. Spouse reports that earlier today she was facetiming her friends and able to keep up with conversations and answer appropriately. Pt requiring set-upA to modA overall for ADL due to R side peripheral deficits, BUE weakness and pt fatigues fast. MinA overall for mobility with RW increased difficulty to avoid obstacles on R and to push RW at a decent speed.Pt would benefit from continued OT skilled services. OT following acutely.     Recommendations for follow up therapy are one component of a multi-disciplinary discharge planning process, led by the attending physician.  Recommendations may be updated based on patient status, additional functional criteria and insurance authorization.   Follow Up Recommendations  Home health OT    Assistance Recommended at Discharge Frequent or constant Supervision/Assistance  Functional Status Assessment  Patient has had a recent decline in their functional status and demonstrates the ability to make significant improvements in function in a reasonable and predictable amount of time.  Equipment Recommendations  BSC    Recommendations for Other Services       Precautions / Restrictions Precautions Precautions: Fall Precaution  Comments: pt reports h/o frequent falls, seems to have some peripheral vision deficits Restrictions Weight Bearing Restrictions: No      Mobility Bed Mobility Overal bed mobility: Needs Assistance Bed Mobility: Supine to Sit     Supine to sit: Supervision     General bed mobility comments: Pt was OOB in the recliner chair.    Transfers Overall transfer level: Needs assistance Equipment used: Rolling walker (2 wheels) Transfers: Sit to/from Stand Sit to Stand: Min assist           General transfer comment: [email or web address] for power-up to RW; a few seconds required to stand upright to get bearings; plops down without cues to slowly descend.      Balance Overall balance assessment: Needs assistance Sitting-balance support: Feet supported;No upper extremity supported Sitting balance-Leahy Scale: Good     Standing balance support: Bilateral upper extremity supported;Single extremity supported;No upper extremity supported Standing balance-Leahy Scale: Poor Standing balance comment: reliant on at least one UE supported.                           ADL either performed or assessed with clinical judgement   ADL Overall ADL's : Needs assistance/impaired Eating/Feeding: Set up;Sitting   Grooming: Set up;Sitting Grooming Details (indicate cue type and reason): too fatigued after standing to wash hands to perform in standing Upper Body Bathing: Minimal assistance;Sitting   Lower Body Bathing: Moderate assistance;Sitting/lateral leans;Sit to/from stand;Cueing for safety   Upper Body Dressing : Minimal assistance;Sitting   Lower Body Dressing: Moderate assistance;Sitting/lateral leans;Sit to/from stand   Toilet Transfer: Min guard;Ambulation;Rolling walker (2 wheels);Comfort height toilet;Grab bars   Toileting- Clothing Manipulation and Hygiene: Moderate assistance;Sitting/lateral lean;Sit to/from stand  Functional mobility during ADLs: Rolling walker (2  wheels);Cueing for safety;Minimal assistance General ADL Comments: Pt requiring set-upA to modA overall for ADL due to R side peripheral deficits, BUE weakness and pt fatigues fast.     Vision Baseline Vision/History: 1 Wears glasses Ability to See in Adequate Light: 0 Adequate Patient Visual Report: Peripheral vision impairment;Other (comment) (probable on R; pt requiring assist to scan and fo peripheral test, pt could not see anything on the R side of her R eye past midline) Vision Assessment?: Yes Eye Alignment: Within Functional Limits Ocular Range of Motion: Within Functional Limits;Restricted on the right;Impaired-to be further tested in functional context (peripheral visual impairment) Alignment/Gaze Preference: Within Defined Limits Tracking/Visual Pursuits: Right eye does not track laterally Additional Comments: runs into obstacles on R side     Perception     Praxis      Pertinent Vitals/Pain Pain Assessment: Faces Faces Pain Scale: Hurts little more Pain Location: head Pain Descriptors / Indicators: Aching Pain Intervention(s): Monitored during session     Hand Dominance Right   Extremity/Trunk Assessment Upper Extremity Assessment Upper Extremity Assessment: Generalized weakness;RUE deficits/detail;LUE deficits/detail RUE Deficits / Details: 3/5 strength RUE Coordination: decreased fine motor;decreased gross motor LUE Deficits / Details: 3/5 strength LUE Coordination: decreased fine motor;decreased gross motor   Lower Extremity Assessment Lower Extremity Assessment: Generalized weakness LLE Sensation:  (difficulty locating light touch)   Cervical / Trunk Assessment Cervical / Trunk Assessment: Other exceptions;Back Surgery Cervical / Trunk Exceptions: h/o lumbar surgery x 2   Communication Communication Communication: No difficulties   Cognition Arousal/Alertness: Awake/alert Behavior During Therapy: WFL for tasks assessed/performed Overall Cognitive  Status: Impaired/Different from baseline Area of Impairment: Following commands;Safety/judgement;Awareness;Problem solving;Memory                     Memory: Decreased short-term memory Following Commands: Follows one step commands with increased time Safety/Judgement: Decreased awareness of safety;Decreased awareness of deficits Awareness: Intellectual Problem Solving: Slow processing;Decreased initiation;Requires verbal cues;Difficulty sequencing;Requires tactile cues General Comments: Pt performed Short Blessed Test and was able to pass with increased time and assist for "March through January" getting stuck at April when stating months of the year in reverse order.Pt A/O x4, looked very serious when trying to concentrate.     General Comments  Spouse in room.    Exercises     Shoulder Instructions      Home Living Family/patient expects to be discharged to:: Private residence Living Arrangements: Spouse/significant other Available Help at Discharge: Family;Available 24 hours/day Type of Home: House Home Access: Stairs to enter [employer] of Steps: 5 Entrance Stairs-Rails: Right;Left Home Layout: One level     Bathroom Shower/Tub: Occupational psychologist: Handicapped height     Home Equipment: Conservation officer, nature (2 wheels);Kasandra Knudsen - single point   Additional Comments: from [place], but has been in the [place] for >50 years      Prior Functioning/Environment Prior Level of Function : Independent/Modified Independent             Mobility Comments: Walks with a cane, mostly in the community not in the house. ADLs Comments: Independent. Cooks, drives, own housework,   Does not work, manages her own medications.        OT Problem List: Decreased strength;Decreased activity tolerance;Impaired balance (sitting and/or standing);Decreased cognition;Decreased coordination;Decreased safety awareness;Impaired UE functional use      OT  Treatment/Interventions: Self-care/ADL training;Therapeutic exercise;Neuromuscular education;Therapeutic activities;Cognitive remediation/compensation;Visual/perceptual remediation/compensation;Patient/family education;Balance training    OT Goals(Current  goals can be found in the care plan section) Acute Rehab OT Goals Patient Stated Goal: to go home OT Goal Formulation: With patient/family Time For Goal Achievement: 03/10/21 Potential to Achieve Goals: Good ADL Goals Additional ADL Goal #1: pt will perform 3 step ADl task with <2 cues for sequencing/attention. Additional ADL Goal #2: Pt will perform higher level cognitive tasks <2 cues for assist.  OT Frequency: Min 2X/week   Barriers to D/C:            Co-evaluation              AM-PAC OT "6 Clicks" Daily Activity     Outcome Measure Help from another person eating meals?: None Help from another person taking care of personal grooming?: None Help from another person toileting, which includes using toliet, bedpan, or urinal?: A Little Help from another person bathing (including washing, rinsing, drying)?: A Little Help from another person to put on and taking off regular upper body clothing?: None Help from another person to put on and taking off regular lower body clothing?: A Little 6 Click Score: 21   End of Session Equipment Utilized During Treatment: Gait belt;Rolling walker (2 wheels) Nurse Communication: Mobility status  Activity Tolerance: Patient tolerated treatment well Patient left: in chair;with call bell/phone within reach;with chair alarm set;with nursing/sitter in room  OT Visit Diagnosis: Unsteadiness on feet (R26.81);Muscle weakness (generalized) (M62.81);Cognitive communication deficit (R41.841)                Time: 1000-1023 OT Time Calculation (min): 23 min Charges:  OT General Charges $OT Visit: 1 Visit OT Evaluation $OT Eval Moderate Complexity: 1 Mod OT Treatments $Self Care/Home Management :  8-22 mins  Jefferey Pica, OTR/L Acute Rehabilitation Services Pager: [phone] Office: [place] 02/24/2021, 1:28 PM

## 2021-02-24 NOTE — Progress Notes (Signed)
Physical Therapy Treatment Patient Details Name: Beverly Ballard MRN: [id number] DOB: June 05, 1945 Today's Date: 02/24/2021   History of Present Illness 75 y.o. 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  Pt with significant PMH of anemia, arthritis (L TKA and revision), CA (vaginal), dgenerative disorder of the eye, gout, migraine, low back pain (lumbar surgery x 2), insomnia, seizure, and diverticulosis.    PT Comments    Pt progressing towards goals, continues to need supervision for all mobility due to hitting obstacles on R side. Worked on upright posture and scanning visual field up ahead but needs frequent cues for this as she forgets and returns to looking down at feet and flexing trunk. Pt able to ascend/ descend 4 stairs sideways with min A and L rail. Husband will be at home with pt upon d/c. PT will continue to follow.     Recommendations for follow up therapy are one component of a multi-disciplinary discharge planning process, led by the attending physician.  Recommendations may be updated based on patient status, additional functional criteria and insurance authorization.  Follow Up Recommendations  Home health PT     Assistance Recommended at Discharge Frequent or constant Supervision/Assistance  Equipment Recommendations  None recommended by PT    Recommendations for Other Services OT consult     Precautions / Restrictions Precautions Precautions: Fall Precaution Comments: pt reports h/o frequent falls, seems to have some peripheral vision deficits Restrictions Weight Bearing Restrictions: No     Mobility  Bed Mobility Overal bed mobility: Needs Assistance Bed Mobility: Supine to Sit     Supine to sit: Supervision     General bed mobility comments: pt came to EOB without physical assist with increased time    Transfers Overall transfer level: Needs assistance Equipment used: Rolling walker (2 wheels) Transfers: Sit  to/from Stand Sit to Stand: Min guard           General transfer comment: no physical assist needed from bed, pt did better controlling descent to sit in chair today    Ambulation/Gait Ambulation/Gait assistance: Min guard Gait Distance (Feet): 200 Feet Assistive device: Rolling walker (2 wheels) Gait Pattern/deviations: Step-through pattern;Trunk flexed Gait velocity: decreased Gait velocity interpretation: 1.31 - 2.62 ft/sec, indicative of limited community ambulator General Gait Details: pt continues to bump obstacles on R side. Tends to look down at the ground with flexed posture. Worked on upright posture, staying within RW, and scanning visual field up ahead. Husband educated on this as well.   Stairs Stairs: Yes Stairs assistance: Min assist Stair Management: One rail Left;Step to pattern;Sideways Number of Stairs: 4 General stair comments: pt safest when facing rail for extra support. Pt with 2/4 DOE on stairs which she reports is normal for her   Wheelchair Mobility    Modified Rankin (Stroke Patients Only)       Balance Overall balance assessment: Needs assistance Sitting-balance support: Feet supported;No upper extremity supported Sitting balance-Leahy Scale: Good     Standing balance support: Bilateral upper extremity supported;Single extremity supported;No upper extremity supported Standing balance-Leahy Scale: Poor Standing balance comment: able to maintain static stance without UE support but needs support for safety with dynamic                            Cognition Arousal/Alertness: Awake/alert Behavior During Therapy: WFL for tasks assessed/performed Overall Cognitive Status: Impaired/Different from baseline Area of Impairment: Following commands;Safety/judgement;Awareness;Problem solving;Memory  Memory: Decreased short-term memory Following Commands: Follows one step commands with increased  time Safety/Judgement: Decreased awareness of safety;Decreased awareness of deficits Awareness: Intellectual Problem Solving: Slow processing;Decreased initiation;Requires verbal cues;Difficulty sequencing;Requires tactile cues General Comments: Pt appropriate but slow with some word finding problems. Worked on attending to R side with mobility, needed frequent reminders due to [facility] deficits        Exercises      General Comments General comments (skin integrity, edema, etc.): Pt relays hypersensitivity to touch of LLE. Discussed desensitization activities      Pertinent Vitals/Pain Pain Assessment: Faces Faces Pain Scale: Hurts little more Pain Location: head Pain Descriptors / Indicators: Aching Pain Intervention(s): Monitored during session    Home Living                          Prior Function            PT Goals (current goals can now be found in the care plan section) Acute Rehab PT Goals Patient Stated Goal: to stop falling so much PT Goal Formulation: With patient Time For Goal Achievement: 03/09/21 Potential to Achieve Goals: Good Progress towards PT goals: Progressing toward goals    Frequency    Min 4X/week      PT Plan Current plan remains appropriate    Co-evaluation              AM-PAC PT "6 Clicks" Mobility   Outcome Measure  Help needed turning from your back to your side while in a flat bed without using bedrails?: A Little Help needed moving from lying on your back to sitting on the side of a flat bed without using bedrails?: A Little Help needed moving to and from a bed to a chair (including a wheelchair)?: A Little Help needed standing up from a chair using your arms (e.g., wheelchair or bedside chair)?: A Little Help needed to walk in hospital room?: A Little Help needed climbing 3-5 steps with a railing? : A Little 6 Click Score: 18    End of Session Equipment Utilized During Treatment: Gait belt Activity Tolerance:  Patient tolerated treatment well Patient left: in chair;with call bell/phone within reach;with family/visitor present Nurse Communication: Mobility status PT Visit Diagnosis: Muscle weakness (generalized) (M62.81);Difficulty in walking, not elsewhere classified (R26.2);Repeated falls (R29.6);Other symptoms and signs involving the nervous system (K44.010)     Time: 2725-3664 PT Time Calculation (min) (ACUTE ONLY): 37 min  Charges:  $Gait Training: 23-37 mins                     [place]  Pager [phone] Office [place] 02/24/2021, 11:54 AM

## 2021-02-24 NOTE — Progress Notes (Signed)
At "missing hour" during time change, patients vitals: HR: 53, oxygen saturation 91%, respirations 11, BP 108/52 (69).  Patient oriented x 4, follows commands, moves all extremities.

## 2021-02-24 NOTE — Progress Notes (Signed)
Postop day 2.  Patient continues to have moderate headache.  No new neurologic symptoms.  She is afebrile.  Her vital signs are stable.  She is awake and alert.  She is oriented and appropriate.  Motor and sensory function are stable.  Mild visual field deficit stable.  Wound clean and dry.  Follow-up MRI scan demonstrates postoperative change with some minimal hemorrhage within the resection cavity but no mass-effect.  Question some residual tumor although this is not well demonstrated.  Status post craniotomy and resection of brain tumor.  Pathology pending.  Once pathology is better determine need for any additional surgery we will be better delineated.  Continue efforts at mobilization and pain control.

## 2021-02-24 NOTE — Progress Notes (Signed)
Pt has refused CPAP several nights now

## 2021-02-24 NOTE — Progress Notes (Signed)
PHARMACIST - PHYSICIAN COMMUNICATION  DR:   Ronnald Ramp  CONCERNING: IV to Oral Route Change Policy  RECOMMENDATION: This patient is receiving Protonix by the intravenous route.  Based on criteria approved by the Pharmacy and Therapeutics Committee, the intravenous medication(s) is/are being converted to the equivalent oral dose form(s).   DESCRIPTION: These criteria include: The patient is eating (either orally or via tube) and/or has been taking other orally administered medications for a least 24 hours The patient has no evidence of active gastrointestinal bleeding or impaired GI absorption (gastrectomy, short bowel, patient on TNA or NPO).  If you have questions about this conversion, please contact the Pharmacy Department  []   [phone] )  [facility] []   [phone] )  [facility] [x]   [phone] )  [facility] []   [phone] )  [facility] []   [phone] )  [place], [facility] 02/24/2021 9:55 AM

## 2021-02-25 ENCOUNTER — Encounter (HOSPITAL_COMMUNITY): Payer: Self-pay | Admitting: Neurological Surgery

## 2021-02-25 ENCOUNTER — Inpatient Hospital Stay (HOSPITAL_COMMUNITY): Payer: Medicare HMO

## 2021-02-25 ENCOUNTER — Telehealth: Payer: Self-pay | Admitting: Internal Medicine

## 2021-02-25 ENCOUNTER — Telehealth: Payer: Self-pay | Admitting: *Deleted

## 2021-02-25 ENCOUNTER — Other Ambulatory Visit: Payer: Self-pay | Admitting: Radiation Therapy

## 2021-02-25 ENCOUNTER — Other Ambulatory Visit: Payer: Self-pay | Admitting: *Deleted

## 2021-02-25 ENCOUNTER — Ambulatory Visit: Payer: Medicare HMO | Admitting: Family Medicine

## 2021-02-25 DIAGNOSIS — G9389 Other specified disorders of brain: Secondary | ICD-10-CM

## 2021-02-25 DIAGNOSIS — I484 Atypical atrial flutter: Secondary | ICD-10-CM | POA: Diagnosis not present

## 2021-02-25 DIAGNOSIS — I4891 Unspecified atrial fibrillation: Secondary | ICD-10-CM | POA: Diagnosis not present

## 2021-02-25 LAB — ECHOCARDIOGRAM COMPLETE
AR max vel: 1.94 cm2
AV Area VTI: 2.03 cm2
AV Area mean vel: 1.94 cm2
AV Mean grad: 5 mmHg
AV Peak grad: 10 mmHg
Ao pk vel: 1.58 m/s
Area-P 1/2: 3.51 cm2
Height: 65 in
S' Lateral: 3.04 cm
Weight: 3671.98 oz

## 2021-02-25 LAB — BASIC METABOLIC PANEL WITH GFR
Anion gap: 4 — ABNORMAL LOW (ref 5–15)
BUN: 14 mg/dL (ref 8–23)
CO2: 30 mmol/L (ref 22–32)
Calcium: 8.8 mg/dL — ABNORMAL LOW (ref 8.9–10.3)
Chloride: 107 mmol/L (ref 98–111)
Creatinine, Ser: 0.92 mg/dL (ref 0.44–1.00)
GFR, Estimated: >60 mL/min
Glucose, Bld: 112 mg/dL — ABNORMAL HIGH (ref 70–99)
Potassium: 4.2 mmol/L (ref 3.5–5.1)
Sodium: 141 mmol/L (ref 135–145)

## 2021-02-25 LAB — MAGNESIUM: Magnesium: 2.1 mg/dL (ref 1.7–2.4)

## 2021-02-25 MED ORDER — PERFLUTREN LIPID MICROSPHERE
1.0000 mL | INTRAVENOUS | Status: AC | PRN
Start: 2021-02-25 — End: 2021-02-25
  Administered 2021-02-25: 2 mL via INTRAVENOUS
  Filled 2021-02-25: qty 10

## 2021-02-25 NOTE — Progress Notes (Signed)
Report called to [place] on [address]

## 2021-02-25 NOTE — Telephone Encounter (Signed)
Noted. Patient still in hospital and path still pending from surgery.

## 2021-02-25 NOTE — Progress Notes (Signed)
Pt transferred to 3 W 23

## 2021-02-25 NOTE — Progress Notes (Signed)
Subjective: Patient reports having trouble with word finding and headaches.   Objective: Vital signs in last 24 hours: Temp:  [97.9 F (36.6 C)-98.3 F (36.8 C)] 97.9 F (36.6 C) (11/07 0400) Pulse Rate:  [59-111] 64 (11/07 0800) Resp:  [12-26] 20 (11/07 0800) BP: (99-136)/(47-90) 111/90 (11/07 0800) SpO2:  [90 %-98 %] 96 % (11/07 0800)  Intake/Output from previous day: 11/06 0701 - 11/07 0700 In: 480 [P.O.:480] Out: -  Intake/Output this shift: No intake/output data recorded.  Neurologic: Grossly normal, right visual field deficit  Lab Results: Lab Results  Component Value Date   WBC 8.0 02/18/2021   HGB 12.2 02/18/2021   HCT 37.9 02/18/2021   MCV 97.2 02/18/2021   PLT 178 02/18/2021   Lab Results  Component Value Date   INR 1.1 (H) 09/09/2019   BMET Lab Results  Component Value Date   NA 141 02/25/2021   K 4.2 02/25/2021   CL 107 02/25/2021   CO2 30 02/25/2021   GLUCOSE 112 (H) 02/25/2021   BUN 14 02/25/2021   CREATININE 0.92 02/25/2021   CALCIUM 8.8 (L) 02/25/2021    Studies/Results: MR BRAIN W WO CONTRAST  Result Date: 02/23/2021 CLINICAL DATA:  Postop day 1 following left parietal tumor resection. EXAM: MRI HEAD WITHOUT AND WITH CONTRAST TECHNIQUE: Multiplanar, multiecho pulse sequences of the brain and surrounding structures were obtained without and with intravenous contrast. CONTRAST:  51mL GADAVIST GADOBUTROL 1 MMOL/ML IV SOLN COMPARISON:  02/18/2021 FINDINGS: Brain: Sequelae of interval left parietal tumor resection are identified. A resection cavity is present containing blood products. There is a small amount restricted diffusion primarily inferior to the resection cavity measuring approximately 2.5 cm in size. There is a 1 cm ring-enhancing lesion lateral to the resection cavity in the left parietal lobe (series 18, image 28) with surrounding nonenhancing T2 hyperintensity involving cortex compatible with residual tumor. There is minimal extra-axial  fluid subjacent to the craniotomy with mild dural enhancement which is likely postoperative. Mild-to-moderate edema in the left parietal lobe has mildly decreased. There is mild mass effect on the left lateral ventricle. There is no midline shift. Vascular: Major intracranial vascular flow voids are preserved. Skull and upper cervical spine: Left parietal craniotomy. Small amount of fluid in the overlying soft tissues. Skin staples. Sinuses/Orbits: Unremarkable orbits. Minimal mucosal thickening in the left maxillary sinus. Clear mastoid air cells. Other: None. IMPRESSION: 1. Postoperative changes from interval left parietal tumor resection. Evidence of some residual enhancing and nonenhancing tumor lateral to the resection cavity. 2. Mildly decreased edema. Electronically Signed   By: Logan Bores M.D.   On: 02/23/2021 17:54    Assessment/Plan: Postop day 3 crani for resection of suspected GBM. Awaiting pathology. Continue efforts in mobilization today. She does have a right visual field deficit. Will plan to transfer to floor today.    LOS: 3 days    Ocie [facility] 02/25/2021, 8:17 AM

## 2021-02-25 NOTE — Progress Notes (Signed)
PT Cancellation Note  Patient Details Name: Beverly Ballard MRN: [id number] DOB: [birth year]   Cancelled Treatment:    Reason Eval/Treat Not Completed: Other (comment).  Pt actively transferring to another unit.  PT to check back later today or tomorrow as time allows.   Thanks,  Verdene Lennert, PT, DPT  Acute Rehabilitation Ortho Tech Supervisor [phone] pager #[phone] office      Wells Guiles B Ambra Haverstick 02/25/2021, 3:53 PM

## 2021-02-25 NOTE — Telephone Encounter (Signed)
Scheduled appt per 11/7 referral. Pt is aware of appt date and time.

## 2021-02-25 NOTE — TOC Initial Note (Signed)
Transition of Care [facility]) - Initial/Assessment Note    Patient Details  Name: Beverly Ballard MRN: [id number] Date of Birth: [birth year]  [facility] of [facility]) CM/SW Contact:    Ella Bodo, RN Phone Number: 02/25/2021, 3:47 PM  Clinical Narrative:                 75 y.o. 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  Prior to admission, patient independent and living at home with spouse; she ambulates with a cane when in the community.  PT/OT recommending home health follow-up, and patient agreeable to services.  Referral to [facility] for continued therapies at home.  Patient states she has all needed DME at home; none recommended.  Expected Discharge Plan: [place] Barriers to Discharge: Continued Medical Work up   Patient Goals and CMS Choice Patient states their goals for this hospitalization and ongoing recovery are:: to go home CMS Medicare.gov Compare Post Acute Care list provided to:: Patient Choice offered to / list presented to : Patient  Expected Discharge Plan and Services Expected Discharge Plan: [place]   Discharge Planning Services: CM Consult Post Acute Care Choice: [place] arrangements for the past 2 months: Single Family Home                           HH Arranged: PT, OT HH Agency: [place] Date [place]: 02/25/21 Time [place]: 1546 Representative spoke with at [place]: [place] Arrangements/Services Living arrangements for the past 2 months: [place] with:: Spouse Patient language and need for interpreter reviewed:: Yes Do you feel safe going back to the place where you live?: Yes      Need for Family Participation in Patient Care: Yes (Comment) Care giver support system in place?: Yes (comment) Current home services: DME Criminal Activity/Legal Involvement Pertinent to Current  Situation/Hospitalization: No - Comment as needed  Activities of Daily Living Home Assistive Devices/Equipment: Eyeglasses, Cane (specify quad or straight), Walker (specify type) ADL Screening (condition at time of admission) Patient's cognitive ability adequate to safely complete daily activities?: Yes Is the patient deaf or have difficulty hearing?: No Does the patient have difficulty seeing, even when wearing glasses/contacts?: No Does the patient have difficulty concentrating, remembering, or making decisions?: Yes Patient able to express need for assistance with ADLs?: Yes Does the patient have difficulty dressing or bathing?: No Independently performs ADLs?: Yes (appropriate for developmental age) Does the patient have difficulty walking or climbing stairs?: No Weakness of Legs: Both Weakness of Arms/Hands: None  Permission Sought/Granted   Permission granted to share information with : Yes, Verbal Permission Granted     Permission granted to share info w AGENCY: [facility]        Emotional Assessment Appearance:: Appears stated age Attitude/Demeanor/Rapport: Engaged Affect (typically observed): Accepting Orientation: : Oriented to Self, Oriented to Place, Oriented to  Time, Oriented to Situation      Admission diagnosis:  S/P craniotomy [Z98.890] Patient Active Problem List   Diagnosis Date Noted   S/P craniotomy 02/22/2021   Brain mass 02/17/2021   Subarachnoid hemorrhage ([place]) 02/17/2021   Acute hypoxemic respiratory failure ([place]) 02/17/2021   Atrial flutter ([place]) 02/17/2021   Seizures ([place]) 02/16/2021   Generalized anxiety disorder 08/11/2020   Insomnia 08/11/2020   Major depressive disorder, recurrent episode, moderate ([place]) 08/11/2020  Obstructive sleep apnea 08/11/2020   Ankle edema, bilateral 10/31/2019   Excessive daytime sleepiness 06/28/2019   Shortness of breath 06/28/2019   Healthcare maintenance 06/28/2019   ANA positive 06/28/2019    Bronchiectasis ([place]) 06/28/2019   Depression, major, single episode, moderate ([place]) 11/19/2018   Fatigue 03/23/2018   Left knee pain 01/12/2017   Ovarian cyst 10/30/2016   Aortic atherosclerosis ([place]) 11/19/2015   Hyperlipemia 04/03/2015   S/P revision of total knee 09/19/2014   Seizure disorder ([place]) 01/24/2014   Obesity, BMI unknown 01/24/2014   Osteopenia, stable on dexa 2010 - followed by gyn 06/15/2012   Pap smear abnormality of cervix/human papillomavirus (HPV) positive - 09/2011, followed by gyn 06/15/2012   Essential hypertension 07/03/2009   Hypothyroidism 05/27/2007   Allergic rhinitis 05/27/2007   GERD 05/27/2007   PCP:  Caren Macadam, MD Pharmacy:   [id number] - [place], [place] [address] [place] [address] Phone: [phone] Fax: [phone]     Social Determinants of Health (SDOH) Interventions    Readmission Risk Interventions No flowsheet data found.  Reinaldo Raddle, RN, BSN  Trauma/Neuro ICU Case Manager [phone]

## 2021-02-25 NOTE — Progress Notes (Signed)
Discussed Pt AF with MD, NP. Pt now back in NS[facility]. Pt may still transfer out, just will need tele. No other new orders obtained.

## 2021-02-25 NOTE — Consult Note (Addendum)
Cardiology Consultation:   Patient ID: Beverly EBRAHIM MRN: [id number]; DOB: [birth year]  Admit date: 02/22/2021 Date of Consult: 02/25/2021  PCP:  Caren Macadam, MD   [facility] Providers Cardiologist:  Remotely seen by Dr. Percival Spanish in 2119 Click here to update MD or APP on Care Team, Refresh:1}     Patient Profile:   Beverly Ballard is a 75 y.o. female with a hx of recently diagnosed brain tumor/SAH and atrial fib/flutter, remote chest pain with normal stress 06/2009, anxiety, depression, anemia, vaginal CA, chronic hypersensitivity pneumonitis (followed by pulmonology), diverticulosis, HTN, GERD, gout, migraines, HLD, obesity, hypothyroidism, back pain, seizures, sleep apnea with prior CPAP compliance encouraged who is being seen 02/25/2021 for the evaluation of transient atrial fibrillation at the request of Beverly Peers, NP with neurosurgery.  History of Present Illness:   Beverly Ballard was remotely seen by Dr. Percival Spanish in 2011 for CP and had a normal stress test. She also has had an echo in 09/2019 ordered by pulmonology showing EF 60-65%, grade 1 DD, no significant abnormalities otherwise. She was recently admitted in late October with new onset seizures with recent somnolence, dizziness and confusion. CT head showed a brain mass with surrounding white matter edema and possible small focus of subarachroid hemorrhage. Her seizure medication was adjusted. During that admission she was also reported to have new onset atrial fib/flutter on admit EKG 02/16/21, not placed on anticoagulation due to brain mass/SAH. (Prior EKG 04/2020 was NSR.) Duration not specifically outlined - discharge exam stated regular rhythm. She was discharged home with plan for continued OP f/u. She presented to the hospital 11/4 for planned craniotomy and resection of brain mass. Neuro surgery suspects GBM. Cardiology consulted for atrial fibrillation vs atrial flutter, rates 80s-120s, seen this morning for several hours  - started shortly after 3am. She has since spontaneously converted back to NSR in the 60s without intervention. She has some word finding difficulty but is able to answer yes/no questions or brief answers. She denies any awareness of afib or interim cardiac history since 2011. She has chronic dyspnea on exertion. (Of note, CT chest/abd/pelvis 10/31 shows chronic pulmonary fibrosis or sensitivity pneumonitis changes as described at previous dedicated chest CT, chronic pulmonary artery prominence suggesting a degree of pulmonary arterial hypertension, aortic atherosclerosis and coronary artery calcification.) No chest pain. No known hx atrial fib/flutter before these recent admissions. She denies any overt symptoms this morning.    Past Medical History:  Diagnosis Date   Allergy    Anemia    Anxiety and depression 12/19/2009   Arthritis    Cancer ([place])    vaginal   Constipation    Degenerative disorder of eye    Diverticulosis    Dysrhythmia 01/2021   Atrial fib/flutter   Essential hypertension, benign 06/26/2009   GERD 05/27/2007   takes Nexium daily   Headache    Heart murmur    yrs ago   History of gout    History of migraine many yrs ago   Hyperlipidemia    takes Pravastatin daily   Hypothyroidism    Insomnia    but doesn't take any meds   LOW BACK PAIN 05/27/2007   Numbness    left toes    Osteoporosis    Pneumonia    Seizures ([place])    last 1983, none since then- last seizure 1988 per pt    Sleep apnea    wears CPAP    Past Surgical History:  Procedure Laterality Date  ADENOIDECTOMY     BACK SURGERY     lumbar x2   BRONCHIAL BIOPSY  09/12/2019   Procedure: BRONCHIAL BIOPSIES;  Surgeon: Laurin Coder, MD;  Location: [facility];  Service: Endoscopy;;   BRONCHIAL WASHINGS  09/12/2019   Procedure: BRONCHIAL WASHINGS;  Surgeon: Laurin Coder, MD;  Location: [facility];  Service: Endoscopy;;   COLONOSCOPY  2009   ESOPHAGOGASTRODUODENOSCOPY     Heel spurs  Bilateral    HEMOSTASIS CONTROL  09/12/2019   Procedure: HEMOSTASIS CONTROL;  Surgeon: Laurin Coder, MD;  Location: [facility];  Service: Endoscopy;;  epi   KNEE ARTHROPLASTY Left 11/07/2013   Procedure: COMPUTER ASSISTED TOTAL KNEE ARTHROPLASTY;  Surgeon: Marybelle Killings, MD;  Location: [facility];  Service: Orthopedics;  Laterality: Left;  Left Total Knee Arthroplasty, Cemented, Computer Assist   TONSILLECTOMY     TOTAL KNEE REVISION Left 09/19/2014   Procedure: LEFT TOTAL KNEE REVISION;  Surgeon: Leandrew Koyanagi, MD;  Location: [facility];  Service: Orthopedics;  Laterality: Left;   UPPER GASTROINTESTINAL ENDOSCOPY     VIDEO BRONCHOSCOPY N/A 09/12/2019   Procedure: VIDEO BRONCHOSCOPY WITH FLUORO;  Surgeon: Laurin Coder, MD;  Location: [facility];  Service: Endoscopy;  Laterality: N/A;     Home Medications:  Prior to Admission medications   Medication Sig Start Date End Date Taking? Authorizing Provider  albuterol (VENTOLIN HFA) 108 (90 Base) MCG/ACT inhaler Inhale 1-2 puffs into the lungs every 6 (six) hours as needed for wheezing or shortness of breath. 05/11/20  Yes Providence Lanius A, PA-C  busPIRone (BUSPAR) 15 MG tablet Take 15 mg by mouth 2 (two) times daily.   Yes [provider]  cholecalciferol (VITAMIN D3) 25 MCG (1000 UNIT) tablet Take 1,000 Units by mouth every morning.   Yes [provider]  dexamethasone (DECADRON) 4 MG tablet Take 1 tablet (4 mg total) by mouth 4 (four) times daily for 14 days. 02/18/21 03/04/21 Yes Oswald Hillock, MD  esomeprazole (NEXIUM) 20 MG capsule Take 2 capsules (40 mg total) by mouth daily before breakfast. Patient taking differently: Take 20 mg by mouth daily before breakfast. 03/21/19  Yes Laurey Morale, MD  ferrous sulfate 325 (65 FE) MG tablet Take 325 mg by mouth 3 (three) times a week.   Yes [provider]  levocetirizine (XYZAL) 5 MG tablet Take 5 mg by mouth at bedtime. 03/05/16  Yes [provider]   levothyroxine (SYNTHROID) 88 MCG tablet Take 1 tablet (88 mcg total) by mouth daily. Patient taking differently: Take 88 mcg by mouth daily before breakfast. 10/11/20  Yes Philemon Kingdom, MD  lisinopril (ZESTRIL) 5 MG tablet TAKE 1 TABLET BY MOUTH EVERY DAY Patient taking differently: Take 5 mg by mouth every morning. 10/19/20  Yes Koberlein, Junell C, MD  pravastatin (PRAVACHOL) 20 MG tablet TAKE 1 TABLET (20 MG TOTAL) BY MOUTH DAILY. Patient taking differently: Take 20 mg by mouth at bedtime. 10/19/20  Yes Koberlein, Junell C, MD  sertraline (ZOLOFT) 100 MG tablet Take 2 tablets (200 mg total) by mouth daily. Patient taking differently: Take 100 mg by mouth 2 (two) times daily. 09/04/20  Yes Donnal Moat T, PA-C  solifenacin (VESICARE) 10 MG tablet Take 10 mg by mouth at bedtime. 01/24/21  Yes [provider]  vitamin B-12 (CYANOCOBALAMIN) 1000 MCG tablet Take 1,000 mcg by mouth every morning.   Yes [provider]  zonisamide (ZONEGRAN) 100 MG capsule Take 2 capsules twice a day Patient taking  differently: Take 200 mg by mouth 2 (two) times daily. Seizure medication 11/07/20  Yes Cameron Sprang, MD  Budeson-Glycopyrrol-Formoterol (BREZTRI AEROSPHERE) 160-9-4.8 MCG/ACT AERO Inhale 2 puffs into the lungs 2 (two) times daily. Patient not taking: No sig reported 12/04/20   Caren Macadam, MD  busPIRone (BUSPAR) 10 MG tablet TAKE 1 TABLET BY MOUTH TWICE A DAY 02/18/21   Hurst, Teresa T, PA-C  EPINEPHrine 0.3 mg/0.3 mL IJ SOAJ injection Inject 0.3 mg into the muscle once as needed for anaphylaxis. 09/14/19   [provider]  furosemide (LASIX) 20 MG tablet TAKE 1 TABLET BY MOUTH IN THE MORNING Patient not taking: No sig reported 01/23/20   Mannam, Praveen, MD  hyoscyamine (LEVSIN SL) 0.125 MG SL tablet Place 1-2 tablets (0.125-0.25 mg total) under the tongue every 6 (six) hours as needed. Patient not taking: No sig reported 05/15/20   Yetta Flock, MD  ondansetron  (ZOFRAN ODT) 4 MG disintegrating tablet Take 1 tablet (4 mg total) by mouth every 6 (six) hours as needed for nausea or vomiting. Patient not taking: No sig reported 03/30/19   Caren Macadam, MD  Spacer/Aero-Holding Chambers (AEROCHAMBER PLUS) inhaler Use as instructed 11/16/20   Caren Macadam, MD    Inpatient Medications: Scheduled Meds:  busPIRone  10 mg Oral BID   Chlorhexidine Gluconate Cloth  6 each Topical Daily   darifenacin  7.5 mg Oral Daily   dexamethasone  4 mg Oral Q8H   levothyroxine  88 mcg Oral QAC breakfast   loratadine  5 mg Oral QHS   pantoprazole  40 mg Oral QHS   senna  1 tablet Oral BID   sertraline  100 mg Oral BID   zonisamide  200 mg Oral BID   Continuous Infusions:  PRN Meds: acetaminophen **OR** acetaminophen, albuterol, HYDROcodone-acetaminophen, labetalol, morphine injection, ondansetron **OR** ondansetron (ZOFRAN) IV, promethazine  Allergies:    Allergies  Allergen Reactions   Lamotrigine Swelling    Tongue swelling   Bupropion Other (See Comments)    Unknown reaction   Carbamazepine Other (See Comments)    Hypnatremia   Levetiracetam Other (See Comments)    dizziness   Tramadol Other (See Comments)    Unknown reaction   Adhesive [Tape] Other (See Comments)    ?  Blister after knee surgery   Amitriptyline Nausea And Vomiting   Clarithromycin Other (See Comments)    Unknown reaction     Doxycycline Other (See Comments)    Interfered with seizure medication   Nickel Other (See Comments)    Had to remove knee implant with nickel and replace it   Other Other (See Comments)    Allergic to Metal and perfumes (unknown reaction)   Topamax [Topiramate] Nausea And Vomiting   Estradiol Rash   Latex Rash   Phenytoin Sodium Extended Other (See Comments)    drowsiness    Social History:   Social History   Socioeconomic History   Marital status: Married    Spouse name: Not on file   Number of children: 1   Years of education: Not  on file   Highest education level: Not on file  Occupational History   Occupation: Retired  Tobacco Use   Smoking status: Former    Packs/day: 0.25    Years: 20.00    Pack years: 5.00    Types: Cigarettes    Quit date: 09/08/1978    Years since quitting: 42.4   Smokeless tobacco: Never   Tobacco comments:  quit smoking 75yrs ago  Vaping Use   Vaping Use: Never used  Substance and Sexual Activity   Alcohol use: No   Drug use: No   Sexual activity: Never    Birth control/protection: Post-menopausal  Other Topics Concern   Not on file  Social History Narrative   Right handed    Lives with husband    Social Determinants of Health   Financial Resource Strain: Low Risk    Difficulty of Paying Living Expenses: Not hard at all  Food Insecurity: No Food Insecurity   Worried About Charity fundraiser in the Last Year: Never true   [place] in the Last Year: Never true  Transportation Needs: No Transportation Needs   Lack of Transportation (Medical): No   Lack of Transportation (Non-Medical): No  Physical Activity: Inactive   Days of Exercise per Week: 0 days   Minutes of Exercise per Session: 0 min  Stress: No Stress Concern Present   Feeling of Stress : Not at all  Social Connections: Moderately Isolated   Frequency of Communication with Friends and Family: More than three times a week   Frequency of Social Gatherings with Friends and Family: Twice a week   Attends Religious Services: Never   Marine scientist or Organizations: No   Attends Music therapist: Never   Marital Status: Married  Human resources officer Violence: Not At Risk   Fear of Current or Ex-Partner: No   Emotionally Abused: No   Physically Abused: No   Sexually Abused: No    Family History:    Family History  Problem Relation Age of Onset   Aortic dissection Father    Arthritis Mother    Stroke Mother 22   Heart murmur Other    Throat cancer Sister    Colon cancer Neg Hx     Esophageal cancer Neg Hx    Stomach cancer Neg Hx    Rectal cancer Neg Hx    Colon polyps Neg Hx      ROS:  Please see the history of present illness.   All other ROS reviewed and negative.     Physical Exam/Data:   Vitals:   02/25/21 0015 02/25/21 0323 02/25/21 0400 02/25/21 0800  BP: (!) 126/52  (!) 107/47 111/90  Pulse: (!) 59 (!) 111 95 64  Resp: 17 17 12 20   Temp:   97.9 F (36.6 C) 98.3 F (36.8 C)  TempSrc:   Oral Oral  SpO2: 96% 93% 92% 96%  Weight:      Height:        Intake/Output Summary (Last 24 hours) at 02/25/2021 1109 Last data filed at 02/24/2021 2000 Gross per 24 hour  Intake 240 ml  Output --  Net 240 ml   Last 3 Weights 02/22/2021 02/16/2021 02/11/2021  Weight (lbs) 229 lb 8 oz 229 lb 8 oz 229 lb 8 oz  Weight (kg) 104.1 kg 104.1 kg 104.101 kg     Body mass index is 38.19 kg/m.  General: Well developed, well nourished, in no acute distress. Head: Normocephalic, atraumatic, sclera non-icteric, no xanthomas, nares are without discharge. Neck: Negative for carotid bruits. JVP not elevated. Lungs: Clear bilaterally to auscultation without wheezes, rales, or rhonchi. Breathing is unlabored. Heart: RRR S1 S2 without murmurs, rubs, or gallops.  Abdomen: Soft, non-tender, non-distended with normoactive bowel sounds. No rebound/guarding. Extremities: No clubbing or cyanosis. Trace soft sockline edema. Distal pedal pulses are 2+ and equal bilaterally. Neuro:  Alert and oriented X 3 - answers these questions quickly. For more complicated questions, some word finding difficulty noted. Psych:  Calm affect  EKG:  The EKG was personally reviewed and demonstrates:    Today 6:39am - coarse atrial fib vs flutter 97bpm, no acute STT changes  F/u tracing 10:46am: NSR 66bpm, possible prior inferior/anterior infarct w/ poor R wave progresison but no acute STT changes  Telemetry:  Telemetry was personally reviewed and demonstrates:  originally NSR, then atrial  fib/flutter for several hours then NSR  Relevant CV Studies: Remote stress 2011 was normal  Laboratory Data:  High Sensitivity Troponin:  No results for input(s): TROPONINIHS in the last 720 hours.   Chemistry Recent Labs  Lab 02/25/21 0407  NA 141  K 4.2  CL 107  CO2 30  GLUCOSE 112*  BUN 14  CREATININE 0.92  CALCIUM 8.8*  MG 2.1  GFRNONAA >60  ANIONGAP 4*    No results for input(s): PROT, ALBUMIN, AST, ALT, ALKPHOS, BILITOT in the last 168 hours. Lipids No results for input(s): CHOL, TRIG, HDL, LABVLDL, LDLCALC, CHOLHDL in the last 168 hours.  HematologyNo results for input(s): WBC, RBC, HGB, HCT, MCV, MCH, MCHC, RDW, PLT in the last 168 hours. Thyroid No results for input(s): TSH, FREET4 in the last 168 hours.  BNPNo results for input(s): BNP, PROBNP in the last 168 hours.  DDimer No results for input(s): DDIMER in the last 168 hours.   Radiology/Studies:  MR BRAIN W WO CONTRAST  Result Date: 02/23/2021 CLINICAL DATA:  Postop day 1 following left parietal tumor resection. EXAM: MRI HEAD WITHOUT AND WITH CONTRAST TECHNIQUE: Multiplanar, multiecho pulse sequences of the brain and surrounding structures were obtained without and with intravenous contrast. CONTRAST:  30mL GADAVIST GADOBUTROL 1 MMOL/ML IV SOLN COMPARISON:  02/18/2021 FINDINGS: Brain: Sequelae of interval left parietal tumor resection are identified. A resection cavity is present containing blood products. There is a small amount restricted diffusion primarily inferior to the resection cavity measuring approximately 2.5 cm in size. There is a 1 cm ring-enhancing lesion lateral to the resection cavity in the left parietal lobe (series 18, image 28) with surrounding nonenhancing T2 hyperintensity involving cortex compatible with residual tumor. There is minimal extra-axial fluid subjacent to the craniotomy with mild dural enhancement which is likely postoperative. Mild-to-moderate edema in the left parietal lobe has  mildly decreased. There is mild mass effect on the left lateral ventricle. There is no midline shift. Vascular: Major intracranial vascular flow voids are preserved. Skull and upper cervical spine: Left parietal craniotomy. Small amount of fluid in the overlying soft tissues. Skin staples. Sinuses/Orbits: Unremarkable orbits. Minimal mucosal thickening in the left maxillary sinus. Clear mastoid air cells. Other: None. IMPRESSION: 1. Postoperative changes from interval left parietal tumor resection. Evidence of some residual enhancing and nonenhancing tumor lateral to the resection cavity. 2. Mildly decreased edema. Electronically Signed   By: Logan Bores M.D.   On: 02/23/2021 17:54   CT BRAINLAB HEAD W/O CONTRAST (1MM)  Result Date: 02/22/2021 CLINICAL DATA:  Preop, brain surgery EXAM: CT HEAD WITHOUT CONTRAST TECHNIQUE: Contiguous axial images were obtained from the base of the skull through the vertex without intravenous contrast. COMPARISON:  02/16/2021 CT and 02/18/2021 MRI head FINDINGS: Brain: Redemonstrated mass in the left parietal lobe, measuring approximately 2.1 cm (series 7, image 66), with surrounding hypodensity, likely edema. Additional adjacent enhancing lesions noted on the 02/18/2021 MRI are not appreciated the CT. No significant midline shift. The subarachnoid hemorrhage seen  02/16/2021 exam is not well appreciated on the present exam. Vascular: No hyperdense vessel. Skull: No acute fracture or suspicious lesion. Sinuses/Orbits: Mild mucosal thickening in the left maxillary sinus. Otherwise clear. The orbits are unremarkable Other: The mastoids are well aerated. IMPRESSION: Redemonstrated 2.1 cm mass in the left posterior parietal lobe with surrounding edema, concerning for primary neoplasm or metastasis. Electronically Signed   By: Merilyn Baba M.D.   On: 02/22/2021 11:17     Assessment and Plan:   1. Brain tumor with recent [facility], seizure - s/p craniotomy/resection 11/4 - per neuro  notes, GBM suspected, pathology pending  2. Transient paroxysmal atrial fib/flutter - EKG is c/w either coarse afib versus atrial flutter with variable rates, has since spontaneously converted to NSR - will discuss rx with MD - blood pressure tends to run soft and HR is in the 60s when in NSR so anticipate low dose AVN blocking therapy to start - will stop lisinopril to allow more BP room to treat - per d/w neurosurg NP this AM, patient is not currently a candidate for anticoagulation  - we will await commentary from neurosurgery regarding whether this is a permanent recommendation or contingent on pathology/OP follow-up -check echo - check TSH in AM for completeness  3. Incidental pickup of coronary/aortic atherosclerosis - common finding, no acute concern but can address consideration of statin therapy as OP contingent on next clinical steps of #1  4. Chronic dyspnea - felt multifactorial per recent pulm note, also known to have chronic hypersensitivity pneumonitis - f/u echo   5. OSA on CPAP - resume CPAP QHS when OK with primary team  Risk Assessment/Risk Scores:  {Complete the following score calculators/questions to meet required metrics.  Press F2         :21  CHA2DS2-VASc Score = 3   This indicates a 3.2% annual risk of stroke. The patient's score is based upon: CHF History: 0 HTN History: 1 Diabetes History: 0 Stroke History: 0 Vascular Disease History: 0 Age Score: 1 Gender Score: 1     For questions or updates, please contact [place] Please consult [email or web address] for contact info under    Signed, Charlie Pitter, PA-C  02/25/2021 11:09 AM   History and all data above reviewed.  Patient examined.  I agree with the findings as above.  The patient is status post craniotomy as above.  She has had PAF (flutter seems to be the predominant if not only rhythm.)  She does not feel this rate is variable but seems to be 3:1 or 4:1.  Hemodynamically stable during these  events.   The patient exam reveals COR:RRR  ,  Lungs: Clear  ,  Abd:  Positive bowel sounds, no rebound no guarding, Ext No edema  .  All available labs, radiology testing, previous records reviewed. Agree with documented assessment and plan.   Atrial flutter:  No symptoms and self limited.   She is not an anticoagulation candidate.  I will consider beta blockade before discharge but most likely will wait until after discharge and some period of recovery from surgery and follow up with a monitor to see the frequency and duration of arrhythmias.  Plan TSH and echo.    Jeneen Rinks Joyelle Siedlecki  3:07 PM  02/25/2021

## 2021-02-25 NOTE — Progress Notes (Signed)
  Echocardiogram 2D Echocardiogram with contrast has been performed.  Merrie Roof F 02/25/2021, 5:24 PM

## 2021-02-26 DIAGNOSIS — I480 Paroxysmal atrial fibrillation: Secondary | ICD-10-CM

## 2021-02-26 LAB — TSH: TSH: 0.819 u[IU]/mL (ref 0.350–4.500)

## 2021-02-26 NOTE — Progress Notes (Signed)
Occupational Therapy Treatment Patient Details Name: Beverly Ballard MRN: [id number] DOB: 02-17-1946 Today's Date: 02/26/2021   History of present illness 75 y.o. 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  Pt with significant PMH of anemia, arthritis (L TKA and revision), CA (vaginal), dgenerative disorder of the eye, gout, migraine, low back pain (lumbar surgery x 2), insomnia, seizure, and diverticulosis.   OT comments  Session focused on higher level cog/IADL task of medication management. Pill box test was attempted however pt became quickly overwhelmed and emotional with task; with the first bottle pt re-read it 4-5x and began to place the first pill into the box and had an emotional outburst with grabbing of her head. She was not in pain, just frustrated as she knew this was a simple task to her before. After removing the pill box and breaking down the task pt was able to verbalize how each of the pills would be administered with min A. Pt then educated to have family assist her with this task, however she stated "no, I don't trust them" and "I can do it." Limited insight noted. Throughout session, pt observed with requiring increased time and cues to locate items on the R of her tray table. She continues to benefit from OT acutely. D/c recommendation remains appropriate.    Recommendations for follow up therapy are one component of a multi-disciplinary discharge planning process, led by the attending physician.  Recommendations may be updated based on patient status, additional functional criteria and insurance authorization.    Follow Up Recommendations  Home health OT    Assistance Recommended at Discharge Frequent or constant Supervision/Assistance        Precautions / Restrictions Precautions Precautions: Fall Restrictions Weight Bearing Restrictions: No       Mobility Bed Mobility Overal bed mobility: Needs Assistance              General bed mobility comments: pt sitting in chair upon arrival    Transfers                         Balance Overall balance assessment: Needs assistance Sitting-balance support: Feet supported;No upper extremity supported Sitting balance-Leahy Scale: Good                                     ADL either performed or assessed with clinical judgement   ADL Overall ADL's : Needs assistance/impaired                                       General ADL Comments: session focused on IADL medication management task.    Extremity/Trunk Assessment Upper Extremity Assessment RUE Deficits / Details: 3/5 strength RUE Coordination: decreased fine motor;decreased gross motor LUE Deficits / Details: 3/5 strength LUE Coordination: decreased fine motor;decreased gross motor   Lower Extremity Assessment Lower Extremity Assessment: Defer to PT evaluation        Vision   Additional Comments: pt stated that she has noticed some changes in her vision; she required incrased time and verbal cue to locate items on the R of the tray table   Perception     Praxis      Cognition Arousal/Alertness: Awake/alert Behavior During Therapy: [facility] for tasks assessed/performed;Anxious Overall Cognitive Status: Impaired/Different from baseline Area  of Impairment: Following commands;Awareness;Problem solving                     Memory: Decreased short-term memory Following Commands: Follows one step commands with increased time Safety/Judgement: Decreased awareness of deficits;Decreased awareness of safety Awareness: Emergent Problem Solving: Slow processing;Requires verbal cues General Comments: Pill box assessment today: pt unable to complete task. Pt got "stuck" on first bottle, read is 4-5x, and began to place pill into box and had emotional reaction with crying and was unable to return to task. Pt stated she felt overwhelmed & she "didnt like the pill  box." She realizes the task used to be "easy" for her, and now it is difficult. Once pill box was taken out of sight, pt was able to go through each bottle with min A to verbalize how the pills would be placed into a box.          Exercises     Shoulder Instructions       General Comments VSS on RA, pt emotional this session due to frustration and being overwhelmed with cognitive task    Pertinent Vitals/ Pain       Pain Assessment: Faces Faces Pain Scale: Hurts a little bit Pain Location: head Pain Descriptors / Indicators: Aching;Pressure Pain Intervention(s): Limited activity within patient's tolerance;Monitored during session  Home Living                                          Prior Functioning/Environment              Frequency  Min 2X/week        Progress Toward Goals  OT Goals(current goals can now be found in the care plan section)  Progress towards OT goals: Progressing toward goals  Acute Rehab OT Goals Patient Stated Goal: get better OT Goal Formulation: With patient/family Time For Goal Achievement: 03/10/21 Potential to Achieve Goals: Good ADL Goals Additional ADL Goal #1: pt will perform 3 step ADl task with <2 cues for sequencing/attention. Additional ADL Goal #2: Pt will perform higher level cognitive tasks <2 cues for assist.  Plan Discharge plan remains appropriate    Co-evaluation                 AM-PAC OT "6 Clicks" Daily Activity     Outcome Measure   Help from another person eating meals?: None Help from another person taking care of personal grooming?: None Help from another person toileting, which includes using toliet, bedpan, or urinal?: A Little Help from another person bathing (including washing, rinsing, drying)?: A Little Help from another person to put on and taking off regular upper body clothing?: None Help from another person to put on and taking off regular lower body clothing?: A Little 6  Click Score: 21    End of Session    OT Visit Diagnosis: Unsteadiness on feet (R26.81);Muscle weakness (generalized) (M62.81);Cognitive communication deficit (R41.841)   Activity Tolerance Patient tolerated treatment well   Patient Left in chair;with call bell/phone within reach;with chair alarm set;with nursing/sitter in room   Nurse Communication Mobility status        Time: 8366-2947 OT Time Calculation (min): 17 min  Charges: OT General Charges $OT Visit: 1 Visit OT Treatments $Self Care/Home Management : 8-22 mins   Tracia Lacomb A Ellary Casamento 02/26/2021, 5:19 PM

## 2021-02-26 NOTE — Progress Notes (Addendum)
Physical Therapy Treatment Patient Details Name: Beverly Ballard MRN: [id number] DOB: [birth year] Today's Date: 02/26/2021   History of Present Illness 75 y.o. 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  Pt with significant PMH of anemia, arthritis (L TKA and revision), CA (vaginal), dgenerative disorder of the eye, gout, migraine, low back pain (lumbar surgery x 2), insomnia, seizure, and diverticulosis.    PT Comments    Pt received in supine with husband, [name], present and pt agreeable to therapy session. Pt progressing well towards goals as demonstrated by increased attention to Rt side requiring only occasional verbal cuing for avoiding objects on the Rt. Emphasis on safe hand placement, increased step length and upright posture during household distance gait tasks and stair negotiation. Continue to recommend HHPT upon DC. Pt continues to benefit from PT services to progress toward functional mobility goals.      Recommendations for follow up therapy are one component of a multi-disciplinary discharge planning process, led by the attending physician.  Recommendations may be updated based on patient status, additional functional criteria and insurance authorization.  Follow Up Recommendations  Home health PT     Assistance Recommended at Discharge Frequent or constant Supervision/Assistance  Equipment Recommendations  None recommended by PT    Recommendations for Other Services OT consult     Precautions / Restrictions Precautions Precautions: Fall Precaution Comments: pt reports h/o frequent falls, seems to have some peripheral vision deficits Restrictions Weight Bearing Restrictions: No     Mobility  Bed Mobility Overal bed mobility: Needs Assistance Bed Mobility: Supine to Sit    Supine to sit: Supervision    General bed mobility comments: pt came to EOB without physical assist with increased time    Transfers Overall  transfer level: Needs assistance Equipment used: Rolling walker (2 wheels) Transfers: Sit to/from Stand Sit to Stand: Min guard;Supervision       General transfer comment: no physical assist needed for STS, cues for safe hand placement    Ambulation/Gait Ambulation/Gait assistance: Min guard Gait Distance (Feet): 150 Feet (150 ft, standing break, 40 ft) Assistive device: Rolling walker (2 wheels) Gait Pattern/deviations: Step-through pattern;Trunk flexed;Decreased stride length Gait velocity: decreased     General Gait Details: Pt with increased attention to R side with no bumping into objects this date. Cues for upright posture and increased step length, did need cues while entering bathroom for RW placement due to narrow space.   Stairs Stairs: Yes Stairs assistance: Min assist Stair Management: One rail Left;Step to pattern;Sideways Number of Stairs: 3 General stair comments: Pt facing rail and stepping up sideways for extra support, verbal cues for foot placement       Balance Overall balance assessment: Needs assistance Sitting-balance support: Feet supported;No upper extremity supported Sitting balance-Leahy Scale: Good    Standing balance support: Bilateral upper extremity supported;Single extremity supported;During functional activity;Reliant on assistive device for balance Standing balance-Leahy Scale: Poor Standing balance comment: Dynamic standing balance with BUE support for safety and upright posture as pt tends to flex at hips          Cognition Arousal/Alertness: Awake/alert Behavior During Therapy: WFL for tasks assessed/performed Overall Cognitive Status: Impaired/Different from baseline Area of Impairment: Following commands;Safety/judgement;Awareness;Problem solving;Memory     Memory: Decreased short-term memory Following Commands: Follows one step commands with increased time Safety/Judgement: Decreased awareness of safety;Decreased awareness of  deficits Awareness: Intellectual Problem Solving: Slow processing;Requires verbal cues;Difficulty sequencing General Comments: Pt appropriate but slow with some word  finding problems. Pt with increased awareness of R side today              Pertinent Vitals/Pain Pain Assessment: 0-10 Pain Score: 5  Pain Location: head Pain Descriptors / Indicators: Aching;Pressure     PT Goals (current goals can now be found in the care plan section) Acute Rehab PT Goals Patient Stated Goal: to stop falling so much PT Goal Formulation: With patient Time For Goal Achievement: 03/09/21 Progress towards PT goals: Progressing toward goals    Frequency    Min 4X/week      PT Plan Current plan remains appropriate       AM-PAC PT "6 Clicks" Mobility   Outcome Measure  Help needed turning from your back to your side while in a flat bed without using bedrails?: A Little Help needed moving from lying on your back to sitting on the side of a flat bed without using bedrails?: A Little Help needed moving to and from a bed to a chair (including a wheelchair)?: A Little Help needed standing up from a chair using your arms (e.g., wheelchair or bedside chair)?: A Little Help needed to walk in hospital room?: A Little Help needed climbing 3-5 steps with a railing? : A Little 6 Click Score: 18    End of Session Equipment Utilized During Treatment: Gait belt Activity Tolerance: Patient tolerated treatment well Patient left: in chair;with call bell/phone within reach;with family/visitor present (Pts husban in room, both verbalized understanding to use call bell when pt wants to get back into bed) Nurse Communication: Mobility status;Other (comment) (Pt did not have chair alarm in room, will use call bell when she wants to return to bed) PT Visit Diagnosis: Muscle weakness (generalized) (M62.81);Difficulty in walking, not elsewhere classified (R26.2);Repeated falls (R29.6);Other symptoms and signs  involving the nervous system (R29.898)     Time: 3154-0086 PT Time Calculation (min) (ACUTE ONLY): 26 min  Charges:  $Gait Training: 23-37 mins                     Evelene Croon, Student PTA CI: Carly P., PTA  Carly M Poff 02/26/2021, 1:14 PM

## 2021-02-26 NOTE — Progress Notes (Signed)
Patient placed on cardiac monitor, 3W box 33

## 2021-02-26 NOTE — Plan of Care (Signed)

## 2021-02-26 NOTE — Care Management Important Message (Signed)
Important Message  Patient Details  Name: Beverly Ballard MRN: [id number] Date of Birth: 05/26/1945   Medicare Important Message Given:  Yes     Orbie Pyo 02/26/2021, 2:51 PM

## 2021-02-26 NOTE — Progress Notes (Signed)
Subjective: Patient reports intermittent headache.  She describes difficult vision in the "left eye."  She states she feels like she is "confused."  She states that she has not felt right for the past year.  Objective: Vital signs in last 24 hours: Temp:  [97.9 F (36.6 C)-98.4 F (36.9 C)] 98 F (36.7 C) (11/08 0450) Pulse Rate:  [59-67] 60 (11/08 0450) Resp:  [15-23] 20 (11/08 0450) BP: (93-138)/(48-90) 134/48 (11/08 0450) SpO2:  [95 %-99 %] 96 % (11/08 0450)  Intake/Output from previous day: 11/07 0701 - 11/08 0700 In: 720 [P.O.:720] Out: 1300 [Urine:1300] Intake/Output this shift: No intake/output data recorded.  Neurologic: Grossly normal except for right homonymous inferior quadrantanopia as best I can tell on informal testing.  Incision looks good.  Moving all extremities well.  Awake and alert and conversant.  Lab Results: Lab Results  Component Value Date   WBC 8.0 02/18/2021   HGB 12.2 02/18/2021   HCT 37.9 02/18/2021   MCV 97.2 02/18/2021   PLT 178 02/18/2021   Lab Results  Component Value Date   INR 1.1 (H) 09/09/2019   BMET Lab Results  Component Value Date   NA 141 02/25/2021   K 4.2 02/25/2021   CL 107 02/25/2021   CO2 30 02/25/2021   GLUCOSE 112 (H) 02/25/2021   BUN 14 02/25/2021   CREATININE 0.92 02/25/2021   CALCIUM 8.8 (L) 02/25/2021    Studies/Results: ECHOCARDIOGRAM COMPLETE  Result Date: 02/25/2021    ECHOCARDIOGRAM REPORT   Patient Name:   Beverly Ballard Date of Exam: 02/25/2021 Medical Rec #:  [id number]     Height:       65.0 in Accession #:    [id number]    Weight:       229.5 lb Date of Birth:  1945-11-17    BSA:          2.097 m Patient Age:    75 years      BP:           93/50 mmHg Patient Gender: F             HR:           59 bpm. Exam Location:  Inpatient Procedure: 2D Echo, Cardiac Doppler, Color Doppler and Intracardiac            Opacification Agent Indications:    Atrial fibrillation  History:        Patient has prior history of  Echocardiogram examinations, most                 recent 10/18/2019. Risk Factors:Hypertension, Dyslipidemia,                 Former Smoker and Obesity. Brain tumor post removal 02/22/21.  Sonographer:    Merrie Roof RDCS Referring Phys: [place]  1. Left ventricular ejection fraction, by estimation, is 60 to 65%. The left ventricle has normal function. The left ventricle has no regional wall motion abnormalities. Left ventricular diastolic parameters are consistent with Grade II diastolic dysfunction (pseudonormalization). Elevated left ventricular end-diastolic pressure.  2. Right ventricular systolic function is normal. The right ventricular size is normal.  3. Left atrial size was mildly dilated.  4. The mitral valve is abnormal. Mild mitral valve regurgitation. No evidence of mitral stenosis.  5. Small gradient across valve but leaflets appear to open well on basal short axis views. The aortic valve is tricuspid. There is mild calcification of the aortic  valve. Aortic valve regurgitation is not visualized. Mild to moderate aortic valve sclerosis/calcification is present, without any evidence of aortic stenosis.  6. The inferior vena cava is normal in size with greater than 50% respiratory variability, suggesting right atrial pressure of 3 mmHg. FINDINGS  Left Ventricle: Left ventricular ejection fraction, by estimation, is 60 to 65%. The left ventricle has normal function. The left ventricle has no regional wall motion abnormalities. The left ventricular internal cavity size was normal in size. There is  no left ventricular hypertrophy. Left ventricular diastolic parameters are consistent with Grade II diastolic dysfunction (pseudonormalization). Elevated left ventricular end-diastolic pressure. Right Ventricle: The right ventricular size is normal. No increase in right ventricular wall thickness. Right ventricular systolic function is normal. Left Atrium: Left atrial size was mildly dilated.  Right Atrium: Right atrial size was normal in size. Pericardium: There is no evidence of pericardial effusion. Mitral Valve: The mitral valve is abnormal. There is mild thickening of the mitral valve leaflet(s). There is mild calcification of the mitral valve leaflet(s). Mild mitral valve regurgitation. No evidence of mitral valve stenosis. Tricuspid Valve: The tricuspid valve is normal in structure. Tricuspid valve regurgitation is not demonstrated. No evidence of tricuspid stenosis. Aortic Valve: Small gradient across valve but leaflets appear to open well on basal short axis views. The aortic valve is tricuspid. There is mild calcification of the aortic valve. Aortic valve regurgitation is not visualized. Mild to moderate aortic valve sclerosis/calcification is present, without any evidence of aortic stenosis. Aortic valve mean gradient measures 5.0 mmHg. Aortic valve peak gradient measures 10.0 mmHg. Aortic valve area, by VTI measures 2.03 cm. Pulmonic Valve: The pulmonic valve was normal in structure. Pulmonic valve regurgitation is not visualized. No evidence of pulmonic stenosis. Aorta: The aortic root is normal in size and structure. Venous: The inferior vena cava is normal in size with greater than 50% respiratory variability, suggesting right atrial pressure of 3 mmHg. IAS/Shunts: The interatrial septum was not well visualized.  LEFT VENTRICLE PLAX 2D LVIDd:         4.59 cm   Diastology LVIDs:         3.04 cm   LV e' medial:    5.98 cm/s LV PW:         1.15 cm   LV E/e' medial:  20.1 LV IVS:        1.07 cm   LV e' lateral:   7.62 cm/s LVOT diam:     1.90 cm   LV E/e' lateral: 15.7 LV SV:         74 LV SV Index:   35 LVOT Area:     2.84 cm  RIGHT VENTRICLE RV Basal diam:  3.86 cm LEFT ATRIUM           Index LA diam:      4.10 cm 1.95 cm/m LA Vol (A4C): 51.6 ml 24.60 ml/m  AORTIC VALVE AV Area (Vmax):    1.94 cm AV Area (Vmean):   1.94 cm AV Area (VTI):     2.03 cm AV Vmax:           158.00 cm/s AV  Vmean:          101.000 cm/s AV VTI:            0.364 m AV Peak Grad:      10.0 mmHg AV Mean Grad:      5.0 mmHg LVOT Vmax:         108.00  cm/s LVOT Vmean:        69.100 cm/s LVOT VTI:          0.260 m LVOT/AV VTI ratio: 0.71  AORTA Ao Root diam: 3.70 cm Ao Asc diam:  3.60 cm MITRAL VALVE MV Area (PHT): 3.51 cm     SHUNTS MV Decel Time: 216 msec     Systemic VTI:  0.26 m MV E velocity: 120.00 cm/s  Systemic Diam: 1.90 cm MV A velocity: 78.20 cm/s MV E/A ratio:  1.53 Jenkins Rouge MD Electronically signed by Jenkins Rouge MD Signature Date/Time: 02/25/2021/5:26:48 PM    Final     Assessment/Plan: Status postcraniotomy for primary brain tumor.  I suspect this is a GBM.  Awaiting pathology.  Continue Decadron 4 every 6.  I have reached out to Manuela Schwartz at radiation oncology to get her into the system 2.  Atrial flutter -cardiology on board.  She is a candidate for anticoagulation now if they feel it is necessary.  Echo done.  Estimated body mass index is 38.19 kg/m as calculated from the following:   Height as of this encounter: 5\' 5"  (1.651 m).   Weight as of this encounter: 104.1 kg.    LOS: 4 days    Eustace Moore 02/26/2021, 7:55 AM

## 2021-02-26 NOTE — Progress Notes (Addendum)
Progress Note  Patient Name: SARANYA HARLIN Date of Encounter: 02/26/2021  Primary Cardiologist: Minus Breeding, MD  Subjective   Thinking is a little clearer this morning but with headache overnight and this morning. Still feels a bit confused. No CP, palpitations. Upon transfer from prior unit, she did not have further telemetry so will re-order for today.  Inpatient Medications    Scheduled Meds:  busPIRone  10 mg Oral BID   Chlorhexidine Gluconate Cloth  6 each Topical Daily   darifenacin  7.5 mg Oral Daily   dexamethasone  4 mg Oral Q8H   levothyroxine  88 mcg Oral QAC breakfast   loratadine  5 mg Oral QHS   pantoprazole  40 mg Oral QHS   senna  1 tablet Oral BID   sertraline  100 mg Oral BID   zonisamide  200 mg Oral BID   Continuous Infusions:  PRN Meds: acetaminophen **OR** acetaminophen, albuterol, HYDROcodone-acetaminophen, labetalol, morphine injection, ondansetron **OR** ondansetron (ZOFRAN) IV, promethazine   Vital Signs    Vitals:   02/25/21 1700 02/25/21 2126 02/25/21 2341 02/26/21 0450  BP: (!) 126/58 119/62 (!) 138/57 (!) 134/48  Pulse:  63 (!) 59 60  Resp: 15 18 20 20   Temp: 97.9 F (36.6 C) 98.4 F (36.9 C) 98.1 F (36.7 C) 98 F (36.7 C)  TempSrc: Oral Oral Oral Oral  SpO2: 96% 98% 98% 96%  Weight:      Height:        Intake/Output Summary (Last 24 hours) at 02/26/2021 0852 Last data filed at 02/26/2021 0600 Gross per 24 hour  Intake 720 ml  Output 1300 ml  Net -580 ml   Last 3 Weights 02/22/2021 02/16/2021 02/11/2021  Weight (lbs) 229 lb 8 oz 229 lb 8 oz 229 lb 8 oz  Weight (kg) 104.1 kg 104.1 kg 104.101 kg     Telemetry    N/A - Personally Reviewed  Physical Exam   GEN: No acute distress.  HEENT: Normocephalic, atraumatic, sclera non-icteric. Neck: No JVD or bruits. Cardiac: Reg rhythm HR 50s-60s no murmurs, rubs, or gallops.  Respiratory: Clear to auscultation bilaterally. Breathing is unlabored. GI: Soft, nontender,  non-distended, BS +x 4. MS: no deformity. Extremities: No clubbing or cyanosis. No edema. Distal pedal pulses are 2+ and equal bilaterally. Neuro:  AAOx3. Follows commands. Psych:  Responds to questions appropriately with a normal affect.  Labs    High Sensitivity Troponin:  No results for input(s): TROPONINIHS in the last 720 hours.    Cardiac EnzymesNo results for input(s): TROPONINI in the last 168 hours. No results for input(s): TROPIPOC in the last 168 hours.   Chemistry Recent Labs  Lab 02/25/21 0407  NA 141  K 4.2  CL 107  CO2 30  GLUCOSE 112*  BUN 14  CREATININE 0.92  CALCIUM 8.8*  GFRNONAA >60  ANIONGAP 4*     HematologyNo results for input(s): WBC, RBC, HGB, HCT, MCV, MCH, MCHC, RDW, PLT in the last 168 hours.  BNPNo results for input(s): BNP, PROBNP in the last 168 hours.   DDimer No results for input(s): DDIMER in the last 168 hours.   Radiology    ECHOCARDIOGRAM COMPLETE  Result Date: 02/25/2021    ECHOCARDIOGRAM REPORT   Patient Name:   RHYLIN VENTERS Date of Exam: 02/25/2021 Medical Rec #:  [id number]     Height:       65.0 in Accession #:    [id number]    Weight:  229.5 lb Date of Birth:  07-Nov-1945    BSA:          2.097 m Patient Age:    75 years      BP:           93/50 mmHg Patient Gender: F             HR:           59 bpm. Exam Location:  Inpatient Procedure: 2D Echo, Cardiac Doppler, Color Doppler and Intracardiac            Opacification Agent Indications:    Atrial fibrillation  History:        Patient has prior history of Echocardiogram examinations, most                 recent 10/18/2019. Risk Factors:Hypertension, Dyslipidemia,                 Former Smoker and Obesity. Brain tumor post removal 02/22/21.  Sonographer:    Merrie Roof RDCS Referring Phys: [place]  1. Left ventricular ejection fraction, by estimation, is 60 to 65%. The left ventricle has normal function. The left ventricle has no regional wall motion abnormalities.  Left ventricular diastolic parameters are consistent with Grade II diastolic dysfunction (pseudonormalization). Elevated left ventricular end-diastolic pressure.  2. Right ventricular systolic function is normal. The right ventricular size is normal.  3. Left atrial size was mildly dilated.  4. The mitral valve is abnormal. Mild mitral valve regurgitation. No evidence of mitral stenosis.  5. Small gradient across valve but leaflets appear to open well on basal short axis views. The aortic valve is tricuspid. There is mild calcification of the aortic valve. Aortic valve regurgitation is not visualized. Mild to moderate aortic valve sclerosis/calcification is present, without any evidence of aortic stenosis.  6. The inferior vena cava is normal in size with greater than 50% respiratory variability, suggesting right atrial pressure of 3 mmHg. FINDINGS  Left Ventricle: Left ventricular ejection fraction, by estimation, is 60 to 65%. The left ventricle has normal function. The left ventricle has no regional wall motion abnormalities. The left ventricular internal cavity size was normal in size. There is  no left ventricular hypertrophy. Left ventricular diastolic parameters are consistent with Grade II diastolic dysfunction (pseudonormalization). Elevated left ventricular end-diastolic pressure. Right Ventricle: The right ventricular size is normal. No increase in right ventricular wall thickness. Right ventricular systolic function is normal. Left Atrium: Left atrial size was mildly dilated. Right Atrium: Right atrial size was normal in size. Pericardium: There is no evidence of pericardial effusion. Mitral Valve: The mitral valve is abnormal. There is mild thickening of the mitral valve leaflet(s). There is mild calcification of the mitral valve leaflet(s). Mild mitral valve regurgitation. No evidence of mitral valve stenosis. Tricuspid Valve: The tricuspid valve is normal in structure. Tricuspid valve regurgitation is  not demonstrated. No evidence of tricuspid stenosis. Aortic Valve: Small gradient across valve but leaflets appear to open well on basal short axis views. The aortic valve is tricuspid. There is mild calcification of the aortic valve. Aortic valve regurgitation is not visualized. Mild to moderate aortic valve sclerosis/calcification is present, without any evidence of aortic stenosis. Aortic valve mean gradient measures 5.0 mmHg. Aortic valve peak gradient measures 10.0 mmHg. Aortic valve area, by VTI measures 2.03 cm. Pulmonic Valve: The pulmonic valve was normal in structure. Pulmonic valve regurgitation is not visualized. No evidence of pulmonic stenosis. Aorta: The  aortic root is normal in size and structure. Venous: The inferior vena cava is normal in size with greater than 50% respiratory variability, suggesting right atrial pressure of 3 mmHg. IAS/Shunts: The interatrial septum was not well visualized.  LEFT VENTRICLE PLAX 2D LVIDd:         4.59 cm   Diastology LVIDs:         3.04 cm   LV e' medial:    5.98 cm/s LV PW:         1.15 cm   LV E/e' medial:  20.1 LV IVS:        1.07 cm   LV e' lateral:   7.62 cm/s LVOT diam:     1.90 cm   LV E/e' lateral: 15.7 LV SV:         74 LV SV Index:   35 LVOT Area:     2.84 cm  RIGHT VENTRICLE RV Basal diam:  3.86 cm LEFT ATRIUM           Index LA diam:      4.10 cm 1.95 cm/m LA Vol (A4C): 51.6 ml 24.60 ml/m  AORTIC VALVE AV Area (Vmax):    1.94 cm AV Area (Vmean):   1.94 cm AV Area (VTI):     2.03 cm AV Vmax:           158.00 cm/s AV Vmean:          101.000 cm/s AV VTI:            0.364 m AV Peak Grad:      10.0 mmHg AV Mean Grad:      5.0 mmHg LVOT Vmax:         108.00 cm/s LVOT Vmean:        69.100 cm/s LVOT VTI:          0.260 m LVOT/AV VTI ratio: 0.71  AORTA Ao Root diam: 3.70 cm Ao Asc diam:  3.60 cm MITRAL VALVE MV Area (PHT): 3.51 cm     SHUNTS MV Decel Time: 216 msec     Systemic VTI:  0.26 m MV E velocity: 120.00 cm/s  Systemic Diam: 1.90 cm MV A  velocity: 78.20 cm/s MV E/A ratio:  1.53 Jenkins Rouge MD Electronically signed by Jenkins Rouge MD Signature Date/Time: 02/25/2021/5:26:48 PM    Final     Cardiac Studies   2D echo 02/25/21   1. Left ventricular ejection fraction, by estimation, is 60 to 65%. The  left ventricle has normal function. The left ventricle has no regional  wall motion abnormalities. Left ventricular diastolic parameters are  consistent with Grade II diastolic  dysfunction (pseudonormalization). Elevated left ventricular end-diastolic  pressure.   2. Right ventricular systolic function is normal. The right ventricular  size is normal.   3. Left atrial size was mildly dilated.   4. The mitral valve is abnormal. Mild mitral valve regurgitation. No  evidence of mitral stenosis.   5. Small gradient across valve but leaflets appear to open well on basal  short axis views. The aortic valve is tricuspid. There is mild  calcification of the aortic valve. Aortic valve regurgitation is not  visualized. Mild to moderate aortic valve  sclerosis/calcification is present, without any evidence of aortic  stenosis.   6. The inferior vena cava is normal in size with greater than 50%  respiratory variability, suggesting right atrial pressure of 3 mmHg.   Patient Profile     75 y.o. female recently diagnosed  brain tumor/SAH and atrial fib/flutter, remote chest pain with normal stress 06/2009, anxiety, depression, anemia, vaginal CA, chronic hypersensitivity pneumonitis (followed by pulmonology), diverticulosis, HTN, GERD, gout, migraines, HLD, obesity, hypothyroidism, back pain, seizures, sleep apnea with prior CPAP compliance encouraged. She was admitted for planned craniotomy. Paroxysmal atrial fib/flutter, self-limiting, seen on telemetry.  Assessment & Plan    1. Brain tumor with recent [facility], seizure - s/p craniotomy/resection 11/4 - per neuro notes, GBM suspected, pathology pending   2. Transient PAF/flutter - atrial  flutter seen this admission - Dr. Percival Spanish considering beta blocker prior to DC - will place back on tele this AM - clinically sounds like NSR in the 50-60s - 2D echo 02/25/21 EF 60-65%, grade 2 DD, mild LAE, normal RV, mild MR, small gradient across aortic valve but leaflets open well on short axis views - mild calcification of AV, no AS/AI - per neurosurgery's note, " She is a candidate for anticoagulation now if they feel it is necessary" - will review with MD  - TSH wnl   3. Incidental pickup of coronary/aortic atherosclerosis - common finding, no acute concern but can address consideration of statin therapy as outpatient contingent on next clinical steps of #1   4. Chronic dyspnea - felt multifactorial per recent pulm note, also known to have chronic hypersensitivity pneumonitis - f/u echo stable   5. OSA on CPAP - please resume CPAP QHS when OK with primary team  6. HTN - lisinopril held yesterday due to soft BP and potential plan for BB, await management as above  For questions or updates, please contact [place] Please consult [email or web address] for contact info under Cardiology/STEMI.  Signed, Charlie Pitter, PA-C 02/26/2021, 8:52 AM    History and all data above reviewed.  Patient examined.  I agree with the findings as above.  The patient has no cardiac complaints.  No SOB, chest pain or palpitations.  No arrhythmia on telemetry.  She is very tearful today and had confusion this morning. The patient exam reveals COR:RRR  ,  Lungs: Clear  ,  Abd: Positive bowel sounds, no rebound no guarding, Ext No edema  .  All available labs, radiology testing, previous records reviewed. Agree with documented assessment and plan.   Atrial fib:  Transient.  TSH OK.  Echo OK.  No indication for long term DOAC.  We can follow as an out patient with a monitor as she is a DOAC candidate if increased atrial fib burden suggested that this was necessary.  I will follow up tele results in the AM and we can  arrange follow up. I will not suggest beta blocker.   Jeneen Rinks [place]  11:24 AM  02/26/2021

## 2021-02-27 ENCOUNTER — Emergency Department (HOSPITAL_COMMUNITY): Payer: Medicare HMO

## 2021-02-27 ENCOUNTER — Other Ambulatory Visit: Payer: Self-pay | Admitting: Physician Assistant

## 2021-02-27 ENCOUNTER — Ambulatory Visit (INDEPENDENT_AMBULATORY_CARE_PROVIDER_SITE_OTHER): Payer: Medicare HMO

## 2021-02-27 ENCOUNTER — Other Ambulatory Visit: Payer: Self-pay

## 2021-02-27 ENCOUNTER — Inpatient Hospital Stay (HOSPITAL_COMMUNITY)
Admission: EM | Admit: 2021-02-27 | Discharge: 2021-03-01 | DRG: 100 | Disposition: A | Discharge status: Home Health | Payer: Medicare HMO | Attending: Internal Medicine | Admitting: Internal Medicine

## 2021-02-27 DIAGNOSIS — Z79890 Hormone replacement therapy (postmenopausal): Secondary | ICD-10-CM

## 2021-02-27 DIAGNOSIS — I480 Paroxysmal atrial fibrillation: Secondary | ICD-10-CM | POA: Diagnosis not present

## 2021-02-27 DIAGNOSIS — Z91040 Latex allergy status: Secondary | ICD-10-CM

## 2021-02-27 DIAGNOSIS — E039 Hypothyroidism, unspecified: Secondary | ICD-10-CM | POA: Diagnosis present

## 2021-02-27 DIAGNOSIS — I517 Cardiomegaly: Secondary | ICD-10-CM | POA: Diagnosis not present

## 2021-02-27 DIAGNOSIS — R0902 Hypoxemia: Secondary | ICD-10-CM | POA: Diagnosis not present

## 2021-02-27 DIAGNOSIS — Z888 Allergy status to other drugs, medicaments and biological substances status: Secondary | ICD-10-CM

## 2021-02-27 DIAGNOSIS — G9389 Other specified disorders of brain: Secondary | ICD-10-CM

## 2021-02-27 DIAGNOSIS — I10 Essential (primary) hypertension: Secondary | ICD-10-CM | POA: Diagnosis present

## 2021-02-27 DIAGNOSIS — Z79899 Other long term (current) drug therapy: Secondary | ICD-10-CM

## 2021-02-27 DIAGNOSIS — Z808 Family history of malignant neoplasm of other organs or systems: Secondary | ICD-10-CM

## 2021-02-27 DIAGNOSIS — E669 Obesity, unspecified: Secondary | ICD-10-CM | POA: Diagnosis present

## 2021-02-27 DIAGNOSIS — G40409 Other generalized epilepsy and epileptic syndromes, not intractable, without status epilepticus: Secondary | ICD-10-CM | POA: Diagnosis not present

## 2021-02-27 DIAGNOSIS — Z885 Allergy status to narcotic agent status: Secondary | ICD-10-CM

## 2021-02-27 DIAGNOSIS — Z743 Need for continuous supervision: Secondary | ICD-10-CM | POA: Diagnosis not present

## 2021-02-27 DIAGNOSIS — R404 Transient alteration of awareness: Secondary | ICD-10-CM | POA: Diagnosis not present

## 2021-02-27 DIAGNOSIS — C719 Malignant neoplasm of brain, unspecified: Secondary | ICD-10-CM

## 2021-02-27 DIAGNOSIS — R0600 Dyspnea, unspecified: Secondary | ICD-10-CM | POA: Diagnosis not present

## 2021-02-27 DIAGNOSIS — Z823 Family history of stroke: Secondary | ICD-10-CM

## 2021-02-27 DIAGNOSIS — Z9889 Other specified postprocedural states: Secondary | ICD-10-CM | POA: Diagnosis not present

## 2021-02-27 DIAGNOSIS — E785 Hyperlipidemia, unspecified: Secondary | ICD-10-CM | POA: Diagnosis present

## 2021-02-27 DIAGNOSIS — R0689 Other abnormalities of breathing: Secondary | ICD-10-CM | POA: Diagnosis not present

## 2021-02-27 DIAGNOSIS — Z87891 Personal history of nicotine dependence: Secondary | ICD-10-CM

## 2021-02-27 DIAGNOSIS — R569 Unspecified convulsions: Secondary | ICD-10-CM | POA: Diagnosis not present

## 2021-02-27 DIAGNOSIS — I4892 Unspecified atrial flutter: Secondary | ICD-10-CM | POA: Diagnosis present

## 2021-02-27 DIAGNOSIS — Z96652 Presence of left artificial knee joint: Secondary | ICD-10-CM | POA: Diagnosis present

## 2021-02-27 DIAGNOSIS — Z8261 Family history of arthritis: Secondary | ICD-10-CM

## 2021-02-27 DIAGNOSIS — Z881 Allergy status to other antibiotic agents status: Secondary | ICD-10-CM

## 2021-02-27 DIAGNOSIS — G936 Cerebral edema: Secondary | ICD-10-CM | POA: Diagnosis present

## 2021-02-27 DIAGNOSIS — G40909 Epilepsy, unspecified, not intractable, without status epilepticus: Secondary | ICD-10-CM | POA: Diagnosis not present

## 2021-02-27 DIAGNOSIS — R079 Chest pain, unspecified: Secondary | ICD-10-CM | POA: Diagnosis not present

## 2021-02-27 DIAGNOSIS — Z20822 Contact with and (suspected) exposure to covid-19: Secondary | ICD-10-CM | POA: Diagnosis present

## 2021-02-27 DIAGNOSIS — Z8544 Personal history of malignant neoplasm of other female genital organs: Secondary | ICD-10-CM

## 2021-02-27 DIAGNOSIS — Z6838 Body mass index (BMI) 38.0-38.9, adult: Secondary | ICD-10-CM

## 2021-02-27 LAB — URINALYSIS, ROUTINE W REFLEX MICROSCOPIC
Bilirubin Urine: NEGATIVE
Glucose, UA: NEGATIVE mg/dL
Hgb urine dipstick: NEGATIVE
Ketones, ur: NEGATIVE mg/dL
Leukocytes,Ua: NEGATIVE
Nitrite: NEGATIVE
Protein, ur: NEGATIVE mg/dL
Specific Gravity, Urine: 1.009 (ref 1.005–1.030)
pH: 7 (ref 5.0–8.0)

## 2021-02-27 LAB — CBC WITH DIFFERENTIAL/PLATELET
Abs Immature Granulocytes: 0.14 10*3/uL — ABNORMAL HIGH (ref 0.00–0.07)
Basophils Absolute: 0 10*3/uL (ref 0.0–0.1)
Basophils Relative: 0 %
Eosinophils Absolute: 0 10*3/uL (ref 0.0–0.5)
Eosinophils Relative: 0 %
HCT: 43.5 % (ref 36.0–46.0)
Hemoglobin: 13.9 g/dL (ref 12.0–15.0)
Immature Granulocytes: 1 %
Lymphocytes Relative: 10 %
Lymphs Abs: 1.4 10*3/uL (ref 0.7–4.0)
MCH: 31.9 pg (ref 26.0–34.0)
MCHC: 32 g/dL (ref 30.0–36.0)
MCV: 99.8 fL (ref 80.0–100.0)
Monocytes Absolute: 0.9 10*3/uL (ref 0.1–1.0)
Monocytes Relative: 6 %
Neutro Abs: 11.9 10*3/uL — ABNORMAL HIGH (ref 1.7–7.7)
Neutrophils Relative %: 83 %
Platelets: 203 10*3/uL (ref 150–400)
RBC: 4.36 MIL/uL (ref 3.87–5.11)
RDW: 13.1 % (ref 11.5–15.5)
WBC: 14.4 10*3/uL — ABNORMAL HIGH (ref 4.0–10.5)
nRBC: 0 % (ref 0.0–0.2)

## 2021-02-27 LAB — COMPREHENSIVE METABOLIC PANEL WITH GFR
ALT: 16 U/L (ref 0–44)
AST: 21 U/L (ref 15–41)
Albumin: 3.4 g/dL — ABNORMAL LOW (ref 3.5–5.0)
Alkaline Phosphatase: 73 U/L (ref 38–126)
Anion gap: 9 (ref 5–15)
BUN: 18 mg/dL (ref 8–23)
CO2: 28 mmol/L (ref 22–32)
Calcium: 8.5 mg/dL — ABNORMAL LOW (ref 8.9–10.3)
Chloride: 101 mmol/L (ref 98–111)
Creatinine, Ser: 0.93 mg/dL (ref 0.44–1.00)
GFR, Estimated: >60 mL/min
Glucose, Bld: 101 mg/dL — ABNORMAL HIGH (ref 70–99)
Potassium: 3.7 mmol/L (ref 3.5–5.1)
Sodium: 138 mmol/L (ref 135–145)
Total Bilirubin: 0.4 mg/dL (ref 0.3–1.2)
Total Protein: 6.8 g/dL (ref 6.5–8.1)

## 2021-02-27 LAB — RESP PANEL BY RT-PCR (FLU A&B, COVID) ARPGX2
Influenza A by PCR: NEGATIVE
Influenza B by PCR: NEGATIVE
SARS Coronavirus 2 by RT PCR: NEGATIVE

## 2021-02-27 LAB — MAGNESIUM: Magnesium: 2.2 mg/dL (ref 1.7–2.4)

## 2021-02-27 MED ORDER — ALBUTEROL SULFATE (2.5 MG/3ML) 0.083% IN NEBU: 3.0000 mL | INHALATION_SOLUTION | Freq: Four times a day (QID) | RESPIRATORY_TRACT | Status: DC | PRN

## 2021-02-27 MED ORDER — DEXAMETHASONE 2 MG PO TABS
2.0000 mg | ORAL_TABLET | Freq: Two times a day (BID) | ORAL | Status: DC
  Administered 2021-02-27 – 2021-03-01 (×4): 2 mg via ORAL
  Filled 2021-02-27 (×4): qty 1

## 2021-02-27 MED ORDER — HYDROCODONE-ACETAMINOPHEN 5-325 MG PO TABS
1.0000 | ORAL_TABLET | Freq: Three times a day (TID) | ORAL | Status: DC | PRN
  Administered 2021-02-27: 1 via ORAL
  Filled 2021-02-27: qty 1

## 2021-02-27 MED ORDER — BUSPIRONE HCL 10 MG PO TABS
10.0000 mg | ORAL_TABLET | Freq: Two times a day (BID) | ORAL | Status: DC
  Administered 2021-02-27 – 2021-03-01 (×4): 10 mg via ORAL
  Filled 2021-02-27 (×4): qty 1

## 2021-02-27 MED ORDER — ONDANSETRON HCL 4 MG PO TABS: 4.0000 mg | ORAL_TABLET | Freq: Four times a day (QID) | ORAL | Status: DC | PRN

## 2021-02-27 MED ORDER — IOHEXOL 350 MG/ML SOLN: 70.0000 mL | Freq: Once | INTRAVENOUS | Status: DC | PRN

## 2021-02-27 MED ORDER — DIVALPROEX SODIUM 250 MG PO DR TAB
2000.0000 mg | DELAYED_RELEASE_TABLET | Freq: Once | ORAL | Status: AC
  Administered 2021-02-27: 2000 mg via ORAL
  Filled 2021-02-27: qty 8

## 2021-02-27 MED ORDER — LEVOTHYROXINE SODIUM 88 MCG PO TABS
88.0000 ug | ORAL_TABLET | Freq: Every day | ORAL | Status: DC
  Administered 2021-02-28 – 2021-03-01 (×2): 88 ug via ORAL
  Filled 2021-02-27 (×2): qty 1

## 2021-02-27 MED ORDER — DARIFENACIN HYDROBROMIDE ER 15 MG PO TB24
15.0000 mg | ORAL_TABLET | Freq: Every day | ORAL | Status: DC
  Administered 2021-02-28 – 2021-03-01 (×2): 15 mg via ORAL
  Filled 2021-02-27 (×2): qty 1

## 2021-02-27 MED ORDER — ONDANSETRON HCL 4 MG/2ML IJ SOLN: 4.0000 mg | Freq: Four times a day (QID) | INTRAMUSCULAR | Status: DC | PRN

## 2021-02-27 MED ORDER — ZONISAMIDE 100 MG PO CAPS
200.0000 mg | ORAL_CAPSULE | Freq: Two times a day (BID) | ORAL | Status: DC
  Administered 2021-02-27 – 2021-03-01 (×4): 200 mg via ORAL
  Filled 2021-02-27 (×5): qty 2

## 2021-02-27 MED ORDER — VITAMIN B-12 1000 MCG PO TABS
1000.0000 ug | ORAL_TABLET | Freq: Every morning | ORAL | Status: DC
  Administered 2021-02-28 – 2021-03-01 (×2): 1000 ug via ORAL
  Filled 2021-02-27 (×2): qty 1

## 2021-02-27 MED ORDER — SERTRALINE HCL 100 MG PO TABS
100.0000 mg | ORAL_TABLET | Freq: Two times a day (BID) | ORAL | Status: DC
  Administered 2021-02-27 – 2021-03-01 (×4): 100 mg via ORAL
  Filled 2021-02-27 (×4): qty 1

## 2021-02-27 MED ORDER — DEXAMETHASONE 4 MG PO TABS: 2.0000 mg | ORAL_TABLET | Freq: Two times a day (BID) | ORAL | 0 refills | Status: DC

## 2021-02-27 MED ORDER — VITAMIN D 25 MCG (1000 UNIT) PO TABS
1000.0000 [IU] | ORAL_TABLET | Freq: Every morning | ORAL | Status: DC
  Administered 2021-02-28 – 2021-03-01 (×2): 1000 [IU] via ORAL
  Filled 2021-02-27 (×2): qty 1

## 2021-02-27 MED ORDER — ACETAMINOPHEN 650 MG RE SUPP: 650.0000 mg | Freq: Four times a day (QID) | RECTAL | Status: DC | PRN

## 2021-02-27 MED ORDER — PANTOPRAZOLE SODIUM 40 MG PO TBEC
80.0000 mg | DELAYED_RELEASE_TABLET | Freq: Every day | ORAL | Status: DC
  Administered 2021-02-28 – 2021-03-01 (×2): 80 mg via ORAL
  Filled 2021-02-27 (×3): qty 2

## 2021-02-27 MED ORDER — LISINOPRIL 2.5 MG PO TABS
5.0000 mg | ORAL_TABLET | Freq: Every morning | ORAL | Status: DC
  Administered 2021-02-28 – 2021-03-01 (×2): 5 mg via ORAL
  Filled 2021-02-27 (×2): qty 2

## 2021-02-27 MED ORDER — METOCLOPRAMIDE HCL 5 MG/ML IJ SOLN
5.0000 mg | Freq: Once | INTRAMUSCULAR | Status: AC
  Administered 2021-02-27: 5 mg via INTRAVENOUS
  Filled 2021-02-27: qty 2

## 2021-02-27 MED ORDER — LORATADINE 10 MG PO TABS
10.0000 mg | ORAL_TABLET | Freq: Every day | ORAL | Status: DC
  Administered 2021-02-27 – 2021-02-28 (×2): 10 mg via ORAL
  Filled 2021-02-27 (×2): qty 1

## 2021-02-27 MED ORDER — FERROUS SULFATE 325 (65 FE) MG PO TABS
325.0000 mg | ORAL_TABLET | ORAL | Status: DC
  Administered 2021-03-01: 325 mg via ORAL
  Filled 2021-02-27: qty 1

## 2021-02-27 MED ORDER — HYDROCODONE-ACETAMINOPHEN 5-325 MG PO TABS: 1.0000 | ORAL_TABLET | Freq: Three times a day (TID) | ORAL | 0 refills | Status: DC | PRN

## 2021-02-27 MED ORDER — LEVETIRACETAM IN NACL 1000 MG/100ML IV SOLN
1000.0000 mg | Freq: Once | INTRAVENOUS | Status: AC
  Administered 2021-02-27: 1000 mg via INTRAVENOUS
  Filled 2021-02-27: qty 100

## 2021-02-27 MED ORDER — ACETAMINOPHEN 325 MG PO TABS
650.0000 mg | ORAL_TABLET | Freq: Four times a day (QID) | ORAL | Status: DC | PRN
  Administered 2021-02-28: 650 mg via ORAL
  Filled 2021-02-27: qty 2

## 2021-02-27 MED ORDER — DEXAMETHASONE 4 MG PO TABS: 2.0000 mg | ORAL_TABLET | Freq: Two times a day (BID) | ORAL | Status: DC

## 2021-02-27 MED ORDER — DIVALPROEX SODIUM 250 MG PO DR TAB
500.0000 mg | DELAYED_RELEASE_TABLET | Freq: Two times a day (BID) | ORAL | Status: DC
  Administered 2021-02-28 – 2021-03-01 (×3): 500 mg via ORAL
  Filled 2021-02-27 (×4): qty 2

## 2021-02-27 MED ORDER — DIPHENHYDRAMINE HCL 50 MG/ML IJ SOLN
12.5000 mg | Freq: Once | INTRAMUSCULAR | Status: AC
  Administered 2021-02-27: 12.5 mg via INTRAVENOUS
  Filled 2021-02-27: qty 1

## 2021-02-27 MED ORDER — PRAVASTATIN SODIUM 40 MG PO TABS
20.0000 mg | ORAL_TABLET | Freq: Every day | ORAL | Status: DC
  Administered 2021-02-27 – 2021-02-28 (×2): 20 mg via ORAL
  Filled 2021-02-27 (×2): qty 1

## 2021-02-27 MED ORDER — FUROSEMIDE 20 MG PO TABS
20.0000 mg | ORAL_TABLET | Freq: Every morning | ORAL | Status: DC
  Administered 2021-02-28 – 2021-03-01 (×2): 20 mg via ORAL
  Filled 2021-02-27 (×2): qty 1

## 2021-02-27 NOTE — Progress Notes (Signed)
Progress Note  Patient Name: Beverly Ballard Date of Encounter: 02/27/2021  Primary Cardiologist:   Minus Breeding, MD   Subjective   She is ready to be discharged.  No chest pain or palpitations.  No SOB.  She has weakness in the AM that improves as the morning goes on.  Very sad and emotional.    Inpatient Medications    Scheduled Meds:  busPIRone  10 mg Oral BID   darifenacin  7.5 mg Oral Daily   dexamethasone  4 mg Oral Q8H   levothyroxine  88 mcg Oral QAC breakfast   loratadine  5 mg Oral QHS   pantoprazole  40 mg Oral QHS   senna  1 tablet Oral BID   sertraline  100 mg Oral BID   zonisamide  200 mg Oral BID   Continuous Infusions:  PRN Meds: acetaminophen **OR** acetaminophen, albuterol, HYDROcodone-acetaminophen, labetalol, morphine injection, ondansetron **OR** ondansetron (ZOFRAN) IV, promethazine   Vital Signs    Vitals:   02/27/21 0021 02/27/21 0339 02/27/21 0906 02/27/21 1211  BP: (!) 133/58 (!) 116/49 139/60 131/68  Pulse: (!) 52 (!) 50 (!) 58 66  Resp: 20 20 16 16   Temp: 98.5 F (36.9 C) 98 F (36.7 C) 98.4 F (36.9 C) 98.4 F (36.9 C)  TempSrc: Oral  Oral   SpO2: 95% 96% 99% 95%  Weight:      Height:        Intake/Output Summary (Last 24 hours) at 02/27/2021 1228 Last data filed at 02/27/2021 0900 Gross per 24 hour  Intake 840 ml  Output 950 ml  Net -110 ml   Filed Weights   02/22/21 0804  Weight: 104.1 kg    Telemetry    Brief run of atrial fib - Personally Reviewed  ECG    NA - Personally Reviewed  Physical Exam   GEN: No acute distress.   Neck: No  JVD Cardiac: RRR, no murmurs, rubs, or gallops.  Respiratory: Clear  to auscultation bilaterally. GI: Soft, nontender, non-distended  MS: No  edema; No deformity. Neuro:  Nonfocal  Psych: Normal affect   Labs    Chemistry Recent Labs  Lab 02/25/21 0407  NA 141  K 4.2  CL 107  CO2 30  GLUCOSE 112*  BUN 14  CREATININE 0.92  CALCIUM 8.8*  GFRNONAA >60  ANIONGAP 4*      HematologyNo results for input(s): WBC, RBC, HGB, HCT, MCV, MCH, MCHC, RDW, PLT in the last 168 hours.  Cardiac EnzymesNo results for input(s): TROPONINI in the last 168 hours. No results for input(s): TROPIPOC in the last 168 hours.   BNPNo results for input(s): BNP, PROBNP in the last 168 hours.   DDimer No results for input(s): DDIMER in the last 168 hours.   Radiology    ECHOCARDIOGRAM COMPLETE  Result Date: 02/25/2021    ECHOCARDIOGRAM REPORT   Patient Name:   Beverly Ballard Date of Exam: 02/25/2021 Medical Rec #:  [id number]     Height:       65.0 in Accession #:    [id number]    Weight:       229.5 lb Date of Birth:  04-05-1946    BSA:          2.097 m Patient Age:    75 years      BP:           93/50 mmHg Patient Gender: F  HR:           59 bpm. Exam Location:  Inpatient Procedure: 2D Echo, Cardiac Doppler, Color Doppler and Intracardiac            Opacification Agent Indications:    Atrial fibrillation  History:        Patient has prior history of Echocardiogram examinations, most                 recent 10/18/2019. Risk Factors:Hypertension, Dyslipidemia,                 Former Smoker and Obesity. Brain tumor post removal 02/22/21.  Sonographer:    Merrie Roof RDCS Referring Phys: [place]  1. Left ventricular ejection fraction, by estimation, is 60 to 65%. The left ventricle has normal function. The left ventricle has no regional wall motion abnormalities. Left ventricular diastolic parameters are consistent with Grade II diastolic dysfunction (pseudonormalization). Elevated left ventricular end-diastolic pressure.  2. Right ventricular systolic function is normal. The right ventricular size is normal.  3. Left atrial size was mildly dilated.  4. The mitral valve is abnormal. Mild mitral valve regurgitation. No evidence of mitral stenosis.  5. Small gradient across valve but leaflets appear to open well on basal short axis views. The aortic valve is tricuspid.  There is mild calcification of the aortic valve. Aortic valve regurgitation is not visualized. Mild to moderate aortic valve sclerosis/calcification is present, without any evidence of aortic stenosis.  6. The inferior vena cava is normal in size with greater than 50% respiratory variability, suggesting right atrial pressure of 3 mmHg. FINDINGS  Left Ventricle: Left ventricular ejection fraction, by estimation, is 60 to 65%. The left ventricle has normal function. The left ventricle has no regional wall motion abnormalities. The left ventricular internal cavity size was normal in size. There is  no left ventricular hypertrophy. Left ventricular diastolic parameters are consistent with Grade II diastolic dysfunction (pseudonormalization). Elevated left ventricular end-diastolic pressure. Right Ventricle: The right ventricular size is normal. No increase in right ventricular wall thickness. Right ventricular systolic function is normal. Left Atrium: Left atrial size was mildly dilated. Right Atrium: Right atrial size was normal in size. Pericardium: There is no evidence of pericardial effusion. Mitral Valve: The mitral valve is abnormal. There is mild thickening of the mitral valve leaflet(s). There is mild calcification of the mitral valve leaflet(s). Mild mitral valve regurgitation. No evidence of mitral valve stenosis. Tricuspid Valve: The tricuspid valve is normal in structure. Tricuspid valve regurgitation is not demonstrated. No evidence of tricuspid stenosis. Aortic Valve: Small gradient across valve but leaflets appear to open well on basal short axis views. The aortic valve is tricuspid. There is mild calcification of the aortic valve. Aortic valve regurgitation is not visualized. Mild to moderate aortic valve sclerosis/calcification is present, without any evidence of aortic stenosis. Aortic valve mean gradient measures 5.0 mmHg. Aortic valve peak gradient measures 10.0 mmHg. Aortic valve area, by VTI  measures 2.03 cm. Pulmonic Valve: The pulmonic valve was normal in structure. Pulmonic valve regurgitation is not visualized. No evidence of pulmonic stenosis. Aorta: The aortic root is normal in size and structure. Venous: The inferior vena cava is normal in size with greater than 50% respiratory variability, suggesting right atrial pressure of 3 mmHg. IAS/Shunts: The interatrial septum was not well visualized.  LEFT VENTRICLE PLAX 2D LVIDd:         4.59 cm   Diastology LVIDs:  3.04 cm   LV e' medial:    5.98 cm/s LV PW:         1.15 cm   LV E/e' medial:  20.1 LV IVS:        1.07 cm   LV e' lateral:   7.62 cm/s LVOT diam:     1.90 cm   LV E/e' lateral: 15.7 LV SV:         74 LV SV Index:   35 LVOT Area:     2.84 cm  RIGHT VENTRICLE RV Basal diam:  3.86 cm LEFT ATRIUM           Index LA diam:      4.10 cm 1.95 cm/m LA Vol (A4C): 51.6 ml 24.60 ml/m  AORTIC VALVE AV Area (Vmax):    1.94 cm AV Area (Vmean):   1.94 cm AV Area (VTI):     2.03 cm AV Vmax:           158.00 cm/s AV Vmean:          101.000 cm/s AV VTI:            0.364 m AV Peak Grad:      10.0 mmHg AV Mean Grad:      5.0 mmHg LVOT Vmax:         108.00 cm/s LVOT Vmean:        69.100 cm/s LVOT VTI:          0.260 m LVOT/AV VTI ratio: 0.71  AORTA Ao Root diam: 3.70 cm Ao Asc diam:  3.60 cm MITRAL VALVE MV Area (PHT): 3.51 cm     SHUNTS MV Decel Time: 216 msec     Systemic VTI:  0.26 m MV E velocity: 120.00 cm/s  Systemic Diam: 1.90 cm MV A velocity: 78.20 cm/s MV E/A ratio:  1.53 Jenkins Rouge MD Electronically signed by Jenkins Rouge MD Signature Date/Time: 02/25/2021/5:26:48 PM    Final     Cardiac Studies   Echo:      1. Left ventricular ejection fraction, by estimation, is 60 to 65%. The  left ventricle has normal function. The left ventricle has no regional  wall motion abnormalities. Left ventricular diastolic parameters are  consistent with Grade II diastolic  dysfunction (pseudonormalization). Elevated left ventricular  end-diastolic  pressure.   2. Right ventricular systolic function is normal. The right ventricular  size is normal.   3. Left atrial size was mildly dilated.   4. The mitral valve is abnormal. Mild mitral valve regurgitation. No  evidence of mitral stenosis.   5. Small gradient across valve but leaflets appear to open well on basal  short axis views. The aortic valve is tricuspid. There is mild  calcification of the aortic valve. Aortic valve regurgitation is not  visualized. Mild to moderate aortic valve  sclerosis/calcification is present, without any evidence of aortic  stenosis.   6. The inferior vena cava is normal in size with greater than 50%  respiratory variability, suggesting right atrial pressure of 3 mmHg.    Patient Profile     75 y.o. female recently diagnosed brain tumor/SAH and atrial fib/flutter, remote chest pain with normal stress 06/2009, anxiety, depression, anemia, vaginal CA, chronic hypersensitivity pneumonitis (followed by pulmonology), diverticulosis, HTN, GERD, gout, migraines, HLD, obesity, hypothyroidism, back pain, seizures, sleep apnea with prior CPAP compliance encouraged. She was admitted for planned craniotomy. Paroxysmal atrial fib/flutter, self-limiting, seen on telemetry.  Assessment & Plan    Atrial fib:  She did have a brief paroxysm of atrial fib.  My plan is to send a two week monitor to her to wear and then arrange six week follow up.  I discussed with them and we will arrange and reprint her discharge instructions.    For questions or updates, please contact [place] Please consult [email or web address] for contact info under Cardiology/STEMI.   Signed, Minus Breeding, MD  02/27/2021, 12:28 PM

## 2021-02-27 NOTE — ED Triage Notes (Signed)
Pt bib ems from home with seizure activity. Per ems, pt with brain sx last week to remove a tumor. Pt went home today and had a seizure (hx of same). Pt had 4 witnessed seizures with ems. Given a total of 5mg  versed and 4mg  zofran en route. Pt remains postictal on arrival.  126/83 HR 63 96% RA CBG 95

## 2021-02-27 NOTE — Progress Notes (Addendum)
Physical Therapy Treatment Patient Details Name: Beverly Ballard MRN: [id number] DOB: 01-30-1946 Today's Date: 02/27/2021   History of Present Illness 75 y.o. 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  Pt with significant PMH of anemia, arthritis (L TKA and revision), CA (vaginal), dgenerative disorder of the eye, gout, migraine, low back pain (lumbar surgery x 2), insomnia, seizure, and diverticulosis.    PT Comments    Pt received in chair and agreeable to therapy session. Pts husband, [name], present and supportive throughout. Pt performed Dynamic Gait Index activities with a score of 12/24 which is predictive of falls in older community living adults. Pt and husband educated on importance of activity pacing, safety utilizing gait belt, stair negotiation and car transfers. Pt continues to demonstrate decreased R sided awareness. Continue to recommend HHPT on DC to address remaining deficits as patient is motivated to return to prior level of function. Pt continues to benefit from PT services to progress toward functional mobility goals.    Recommendations for follow up therapy are one component of a multi-disciplinary discharge planning process, led by the attending physician.  Recommendations may be updated based on patient status, additional functional criteria and insurance authorization.  Follow Up Recommendations  Home health PT     Assistance Recommended at Discharge Frequent or constant Supervision/Assistance  Equipment Recommendations  None recommended by PT    Recommendations for Other Services OT consult     Precautions / Restrictions Precautions Precautions: Fall Precaution Comments: pt reports h/o frequent falls, seems to have some peripheral vision deficits Restrictions Weight Bearing Restrictions: No     Mobility  Bed Mobility  General bed mobility comments: Pt received in chair and in chair at end of session     Transfers Overall transfer level: Needs assistance Equipment used: Rolling walker (2 wheels) Transfers: Sit to/from Stand Sit to Stand: Min guard;Supervision   General transfer comment: no physical assist needed for STS, cues for safe hand placement    Ambulation/Gait Ambulation/Gait assistance: Min guard Gait Distance (Feet): 150 Feet (150 ft, standing break, 75 ft) Assistive device: Rolling walker (2 wheels) Gait Pattern/deviations: Step-through pattern;Trunk flexed;Decreased stride length Gait velocity: decreased     General Gait Details: Cues for upright posture and increased step length, pt slowed speed and drifted left/right with horizontal/vertical head turns, had to come to a stop and adjust walker position to step over an object   Stairs   Stairs assistance: Min assist Stair Management: One rail Left;Step to pattern;Sideways Number of Stairs: 6 General stair comments: Pt facing rail and stepping up sideways for extra support, verbal cues for foot placement; DOE 2/4 after stairs, HR ~90      Balance Overall balance assessment: Needs assistance Sitting-balance support: Feet supported;No upper extremity supported Sitting balance-Leahy Scale: Good     Standing balance support: Bilateral upper extremity supported;Single extremity supported;During functional activity;Reliant on assistive device for balance Standing balance-Leahy Scale: Fair Standing balance comment: Dynamic standing balance with BUE support for safety and cues for upright posture as pt tends to flex at the hips and look towards the ground   Standardized Balance Assessment Standardized Balance Assessment : Dynamic Gait Index   Dynamic Gait Index Level Surface: Mild Impairment Change in Gait Speed: Mild Impairment Gait with Horizontal Head Turns: Moderate Impairment Gait with Vertical Head Turns: Moderate Impairment Gait and Pivot Turn: Mild Impairment Step Over Obstacle: Moderate Impairment Step  Around Obstacles: Mild Impairment Steps: Moderate Impairment Total Score: 12  Cognition Arousal/Alertness: Awake/alert Behavior During Therapy: WFL for tasks assessed/performed Overall Cognitive Status: Impaired/Different from baseline Area of Impairment: Following commands;Safety/judgement;Awareness;Problem solving;Memory   Memory: Decreased short-term memory Following Commands: Follows one step commands with increased time;Follows one step commands consistently Safety/Judgement: Decreased awareness of safety;Decreased awareness of deficits Awareness: Intellectual Problem Solving: Slow processing;Requires verbal cues;Difficulty sequencing General Comments: Pt with slow process with appropriate responses        General Comments General comments (skin integrity, edema, etc.): VSS on RA, 5/10 modified RPE at end of session; pt and husband educated on sae handling techniques including use of gait belt and car transfers      Pertinent Vitals/Pain Pain Assessment: 0-10 Pain Score: 5  Pain Location: head Pain Descriptors / Indicators: Aching;Pressure Pain Intervention(s): Limited activity within patient's tolerance;Monitored during session     PT Goals (current goals can now be found in the care plan section) Acute Rehab PT Goals Patient Stated Goal: to stop falling so much PT Goal Formulation: With patient Time For Goal Achievement: 03/09/21 Progress towards PT goals: Progressing toward goals    Frequency    Min 4X/week      PT Plan Current plan remains appropriate       AM-PAC PT "6 Clicks" Mobility   Outcome Measure  Help needed turning from your back to your side while in a flat bed without using bedrails?: A Little Help needed moving from lying on your back to sitting on the side of a flat bed without using bedrails?: A Little Help needed moving to and from a bed to a chair (including a wheelchair)?: A Little Help needed standing up from a chair using your  arms (e.g., wheelchair or bedside chair)?: A Little Help needed to walk in hospital room?: A Little Help needed climbing 3-5 steps with a railing? : A Lot 6 Click Score: 17    End of Session Equipment Utilized During Treatment: Gait belt Activity Tolerance: Patient tolerated treatment well Patient left: in chair;with call bell/phone within reach;with family/visitor present (Pts husband in room) Nurse Communication: Mobility status;Other (comment) (Pt and husband inquiring about bathing upon DC to home) PT Visit Diagnosis: Muscle weakness (generalized) (M62.81);Difficulty in walking, not elsewhere classified (R26.2);Repeated falls (R29.6);Other symptoms and signs involving the nervous system (R29.898)     Time: 2158-7276 PT Time Calculation (min) (ACUTE ONLY): 21 min  Charges:  $Gait Training: 8-22 mins                     Evelene Croon, Student PTA CI: Carly P., PTA  Carly M Poff 02/27/2021, 12:18 PM

## 2021-02-27 NOTE — ED Provider Notes (Signed)
[facility] EMERGENCY DEPARTMENT Provider Note   CSN: [id number] Arrival date & time: 02/27/21  1615     History Chief Complaint  Patient presents with   Seizures    Beverly Ballard is a 75 y.o. female.  75 yo F with a chief complaints of seizure-like activity.  Reportedly had 4 tonic-clonic seizures back-to-back.  Given 10 mg of Versed in route with EMS with some resolution of her shaking but not back to baseline.  Initially required nonrebreather and titrated down to nasal cannula.  Patient was just in the hospital for a craniotomy.  Level 5 caveat altered mental status.  The history is provided by the patient and the spouse.  Seizures Seizure activity on arrival: no   Seizure type:  Grand mal Initial focality:  None Episode characteristics: abnormal movements, eye deviation and generalized shaking   Postictal symptoms: somnolence   Return to baseline: no   Severity:  Moderate Duration:  20 minutes Timing:  Clustered Number of seizures this episode:  4 Progression:  Resolved     Past Medical History:  Diagnosis Date   Allergy    Anemia    Anxiety and depression 12/19/2009   Arthritis    Cancer ([place])    vaginal   Degenerative disorder of eye    Diverticulosis    Dysrhythmia 01/2021   Atrial fib/flutter   Essential hypertension, benign 06/26/2009   GERD 05/27/2007   takes Nexium daily   History of gout    History of migraine many yrs ago   Hyperlipidemia    takes Pravastatin daily   Hypothyroidism    Insomnia    but doesn't take any meds   LOW BACK PAIN 05/27/2007   Osteoporosis    Seizures ([place])    last 1983, none since then- last seizure 1988 per pt    Sleep apnea    wears CPAP    Patient Active Problem List   Diagnosis Date Noted   Seizure ([place]) 02/28/2021   S/P craniotomy 02/22/2021   Brain mass 02/17/2021   Subarachnoid hemorrhage ([place]) 02/17/2021   Acute hypoxemic respiratory failure ([place]) 02/17/2021   Atrial flutter ([place])  02/17/2021   Seizures ([place]) 02/16/2021   Generalized anxiety disorder 08/11/2020   Insomnia 08/11/2020   Major depressive disorder, recurrent episode, moderate ([place]) 08/11/2020   Obstructive sleep apnea 08/11/2020   Ankle edema, bilateral 10/31/2019   Excessive daytime sleepiness 06/28/2019   Shortness of breath 06/28/2019   Healthcare maintenance 06/28/2019   ANA positive 06/28/2019   Bronchiectasis ([place]) 06/28/2019   Depression, major, single episode, moderate ([place]) 11/19/2018   Fatigue 03/23/2018   Left knee pain 01/12/2017   Ovarian cyst 10/30/2016   Aortic atherosclerosis ([place]) 11/19/2015   Hyperlipemia 04/03/2015   S/P revision of total knee 09/19/2014   Seizure disorder ([place]) 01/24/2014   Obesity, BMI unknown 01/24/2014   Osteopenia, stable on dexa 2010 - followed by gyn 06/15/2012   Pap smear abnormality of cervix/human papillomavirus (HPV) positive - 09/2011, followed by gyn 06/15/2012   Essential hypertension 07/03/2009   Hypothyroidism 05/27/2007   Allergic rhinitis 05/27/2007   GERD 05/27/2007    Past Surgical History:  Procedure Laterality Date   ADENOIDECTOMY     APPLICATION OF CRANIAL NAVIGATION N/A 02/22/2021   Procedure: APPLICATION OF CRANIAL NAVIGATION;  Surgeon: Eustace Moore, MD;  Location: [facility];  Service: Neurosurgery;  Laterality: N/A;   BACK SURGERY     lumbar x2   BRONCHIAL BIOPSY  09/12/2019  Procedure: BRONCHIAL BIOPSIES;  Surgeon: Laurin Coder, MD;  Location: [facility];  Service: Endoscopy;;   BRONCHIAL WASHINGS  09/12/2019   Procedure: BRONCHIAL WASHINGS;  Surgeon: Laurin Coder, MD;  Location: [facility];  Service: Endoscopy;;   COLONOSCOPY  2009   CRANIOTOMY N/A 02/22/2021   Procedure: CRANIOTOMY FOR TUMOR EXCISION;  Surgeon: Eustace Moore, MD;  Location: [place];  Service: Neurosurgery;  Laterality: N/A;   ESOPHAGOGASTRODUODENOSCOPY     Heel spurs Bilateral    HEMOSTASIS CONTROL  09/12/2019   Procedure: HEMOSTASIS CONTROL;   Surgeon: Laurin Coder, MD;  Location: [facility];  Service: Endoscopy;;  epi   KNEE ARTHROPLASTY Left 11/07/2013   Procedure: COMPUTER ASSISTED TOTAL KNEE ARTHROPLASTY;  Surgeon: Marybelle Killings, MD;  Location: [place];  Service: Orthopedics;  Laterality: Left;  Left Total Knee Arthroplasty, Cemented, Computer Assist   TONSILLECTOMY     TOTAL KNEE REVISION Left 09/19/2014   Procedure: LEFT TOTAL KNEE REVISION;  Surgeon: Leandrew Koyanagi, MD;  Location: [place];  Service: Orthopedics;  Laterality: Left;   UPPER GASTROINTESTINAL ENDOSCOPY     VIDEO BRONCHOSCOPY N/A 09/12/2019   Procedure: VIDEO BRONCHOSCOPY WITH FLUORO;  Surgeon: Laurin Coder, MD;  Location: [facility];  Service: Endoscopy;  Laterality: N/A;     OB History   No obstetric history on file.     Family History  Problem Relation Age of Onset   Aortic dissection Father    Arthritis Mother    Stroke Mother 11   Heart murmur Other    Throat cancer Sister    Colon cancer Neg Hx    Esophageal cancer Neg Hx    Stomach cancer Neg Hx    Rectal cancer Neg Hx    Colon polyps Neg Hx     Social History   Tobacco Use   Smoking status: Former    Packs/day: 0.25    Years: 20.00    Pack years: 5.00    Types: Cigarettes    Quit date: 09/08/1978    Years since quitting: 42.5   Smokeless tobacco: Never   Tobacco comments:    quit smoking 63yrs ago  Vaping Use   Vaping Use: Never used  Substance Use Topics   Alcohol use: No   Drug use: No    Home Medications Prior to Admission medications   Medication Sig Start Date End Date Taking? Authorizing Provider  albuterol (VENTOLIN HFA) 108 (90 Base) MCG/ACT inhaler Inhale 1-2 puffs into the lungs every 6 (six) hours as needed for wheezing or shortness of breath. 05/11/20  Yes Providence Lanius A, PA-C  busPIRone (BUSPAR) 10 MG tablet TAKE 1 TABLET BY MOUTH TWICE A DAY Patient taking differently: Take 10 mg by mouth 2 (two) times daily. 02/18/21  Yes Donnal Moat T, PA-C   cholecalciferol (VITAMIN D3) 25 MCG (1000 UNIT) tablet Take 1,000 Units by mouth every morning.   Yes [provider]  EPINEPHrine 0.3 mg/0.3 mL IJ SOAJ injection Inject 0.3 mg into the muscle once as needed for anaphylaxis. 09/14/19  Yes [provider]  esomeprazole (NEXIUM) 20 MG capsule Take 2 capsules (40 mg total) by mouth daily before breakfast. Patient taking differently: Take 20 mg by mouth daily before breakfast. 03/21/19  Yes Laurey Morale, MD  ferrous sulfate 325 (65 FE) MG tablet Take 325 mg by mouth 3 (three) times a week.   Yes [provider]  furosemide (LASIX) 20 MG tablet TAKE 1 TABLET BY MOUTH  IN THE MORNING 01/23/20  Yes Mannam, Praveen, MD  HYDROcodone-acetaminophen (NORCO/VICODIN) 5-325 MG tablet Take 1 tablet by mouth every 8 (eight) hours as needed for moderate pain. 02/27/21  Yes Eustace Moore, MD  levocetirizine (XYZAL) 5 MG tablet Take 5 mg by mouth at bedtime. 03/05/16  Yes [provider]  levothyroxine (SYNTHROID) 88 MCG tablet Take 1 tablet (88 mcg total) by mouth daily. Patient taking differently: Take 88 mcg by mouth daily before breakfast. 10/11/20  Yes Philemon Kingdom, MD  lisinopril (ZESTRIL) 5 MG tablet TAKE 1 TABLET BY MOUTH EVERY DAY Patient taking differently: Take 5 mg by mouth every morning. 10/19/20  Yes Koberlein, Junell C, MD  pravastatin (PRAVACHOL) 20 MG tablet TAKE 1 TABLET (20 MG TOTAL) BY MOUTH DAILY. Patient taking differently: Take 20 mg by mouth at bedtime. 10/19/20  Yes Koberlein, Junell C, MD  sertraline (ZOLOFT) 100 MG tablet Take 2 tablets (200 mg total) by mouth daily. Patient taking differently: Take 100 mg by mouth 2 (two) times daily. 09/04/20  Yes Donnal Moat T, PA-C  solifenacin (VESICARE) 10 MG tablet Take 10 mg by mouth at bedtime. 01/24/21  Yes [provider]  vitamin B-12 (CYANOCOBALAMIN) 1000 MCG tablet Take 1,000 mcg by mouth every morning.   Yes [provider]  zonisamide  (ZONEGRAN) 100 MG capsule Take 2 capsules twice a day Patient taking differently: Take 200 mg by mouth 2 (two) times daily. Seizure medication 11/07/20  Yes Cameron Sprang, MD  Budeson-Glycopyrrol-Formoterol (BREZTRI AEROSPHERE) 160-9-4.8 MCG/ACT AERO Inhale 2 puffs into the lungs 2 (two) times daily. Patient not taking: No sig reported 12/04/20   Caren Macadam, MD  dexamethasone (DECADRON) 4 MG tablet Take 0.5 tablets (2 mg total) by mouth 2 (two) times daily. Patient not taking: No sig reported 02/27/21   Eustace Moore, MD  Spacer/Aero-Holding Chambers (AEROCHAMBER PLUS) inhaler Use as instructed 11/16/20   Caren Macadam, MD    Allergies    Lamotrigine, Bupropion, Carbamazepine, Levetiracetam, Tramadol, Adhesive [tape], Amitriptyline, Clarithromycin, Doxycycline, Nickel, Other, Topamax [topiramate], Estradiol, Latex, and Phenytoin sodium extended  Review of Systems   Review of Systems  Constitutional:  Negative for chills and fever.  HENT:  Negative for congestion and rhinorrhea.   Eyes:  Negative for redness and visual disturbance.  Respiratory:  Negative for shortness of breath and wheezing.   Cardiovascular:  Negative for chest pain and palpitations.  Gastrointestinal:  Negative for nausea and vomiting.  Genitourinary:  Negative for dysuria and urgency.  Musculoskeletal:  Negative for arthralgias and myalgias.  Skin:  Negative for pallor and wound.  Neurological:  Positive for seizures. Negative for dizziness and headaches.   Physical Exam Updated Vital Signs BP (!) 108/56 (BP Location: Right Arm)   Pulse (!) 56   Temp 98.3 F (36.8 C) (Oral)   Resp 17   SpO2 99%   Physical Exam Vitals and nursing note reviewed.  Constitutional:      General: She is not in acute distress.    Appearance: She is well-developed. She is not diaphoretic.  HENT:     Head: Normocephalic and atraumatic.  Eyes:     Pupils: Pupils are equal, round, and reactive to light.   Cardiovascular:     Rate and Rhythm: Normal rate and regular rhythm.     Heart sounds: No murmur heard.   No friction rub. No gallop.  Pulmonary:     Effort: Pulmonary effort is normal.     Breath sounds:  No wheezing or rales.  Abdominal:     General: There is no distension.     Palpations: Abdomen is soft.     Tenderness: There is no abdominal tenderness.  Musculoskeletal:        General: No tenderness.     Cervical back: Normal range of motion and neck supple.  Skin:    General: Skin is warm and dry.  Neurological:     Mental Status: She is alert.     Comments: Confused with some leftward gaze preference.  Moving the left side more than the right.  Unable to follow commands.  Responds to painful stimuli on the right and localizes.  Psychiatric:        Behavior: Behavior normal.    ED Results / Procedures / Treatments   Labs (all labs ordered are listed, but only abnormal results are displayed) Labs Reviewed  CBC WITH DIFFERENTIAL/PLATELET - Abnormal; Notable for the following components:      Result Value   WBC 14.4 (*)    Neutro Abs 11.9 (*)    Abs Immature Granulocytes 0.14 (*)    All other components within normal limits  COMPREHENSIVE METABOLIC PANEL - Abnormal; Notable for the following components:   Glucose, Bld 101 (*)    Calcium 8.5 (*)    Albumin 3.4 (*)    All other components within normal limits  CBC - Abnormal; Notable for the following components:   WBC 12.0 (*)    RBC 3.86 (*)    All other components within normal limits  BASIC METABOLIC PANEL - Abnormal; Notable for the following components:   Glucose, Bld 105 (*)    Calcium 8.5 (*)    All other components within normal limits  RESP PANEL BY RT-PCR (FLU A&B, COVID) ARPGX2  MAGNESIUM  URINALYSIS, ROUTINE W REFLEX MICROSCOPIC    EKG EKG Interpretation  Date/Time:  Wednesday February 27 2021 16:23:18 EST Ventricular Rate:  64 PR Interval:  198 QRS Duration: 78 QT Interval:  434 QTC  Calculation: 448 R Axis:   21 Text Interpretation: Sinus rhythm No significant change since last tracing Confirmed by Deno Etienne [phone]) on 02/27/2021 4:49:38 PM  Radiology CT Head Wo Contrast  Result Date: 02/27/2021 CLINICAL DATA:  Seizure.  Abnormal neurological exam. EXAM: CT HEAD WITHOUT CONTRAST TECHNIQUE: Contiguous axial images were obtained from the base of the skull through the vertex without intravenous contrast. COMPARISON:  02/22/2021.  02/23/2021. FINDINGS: Brain: Previous left parieto-occipital craniotomy for tumor debulking and biopsy. Edema, air and a small amount of blood products are present in the region of the surgery as expected. Regional vasogenic edema remains evident. Without contrast, residual enhancing tumor in the region previously shown cannot be assessed. No unexpected or worsening finding. The remainder the brain appears negative. No hydrocephalus or extra-axial collection. Vascular: No abnormal vascular finding. Skull: Otherwise negative. Sinuses/Orbits: Clear/normal Other: None IMPRESSION: No unexpected finding. Recent left parieto-occipital craniotomy for tumor resection/debulking. Edema, small amount of air and small amount of blood remaining evident at the surgical site as expected. No worsening or unexpected finding. Residual enhancing tumor previously shown cannot be assessed on this noncontrast exam. The remainder of the brain appears negative. Electronically Signed   By: Nelson Chimes M.D.   On: 02/27/2021 17:33   DG Chest Port 1 View  Result Date: 02/27/2021 CLINICAL DATA:  Difficulty breathing, seizures EXAM: PORTABLE CHEST 1 VIEW COMPARISON:  Previous studies including the radiograph done on 02/16/2021 and CT done on 02/18/2021 FINDINGS:  Transverse diameter of heart is increased. Increased interstitial and alveolar densities seen in both lungs, more so on the right side. There is slight improvement in infiltrates in both lungs. There is poor inspiration. Left  lateral CP angle is not included. Right lateral CP angle is clear. There is no pneumothorax. IMPRESSION: Cardiomegaly. Increased interstitial markings are seen in both lungs, more so on the right side suggesting scarring. There is slight improvement in aeration of parahilar regions which may be due to slightly better inspiration or suggest decrease in superimposed interstitial pneumonitis. No new focal infiltrates are seen. Electronically Signed   By: Elmer Picker M.D.   On: 02/27/2021 16:51    Procedures Procedures   Medications Ordered in ED Medications  iohexol (OMNIPAQUE) 350 MG/ML injection 70 mL (has no administration in time range)  divalproex (DEPAKOTE) DR tablet 500 mg (500 mg Oral Given 02/28/21 0834)  acetaminophen (TYLENOL) tablet 650 mg (650 mg Oral Given 02/28/21 0835)    Or  acetaminophen (TYLENOL) suppository 650 mg ( Rectal See Alternative 02/28/21 0835)  ondansetron (ZOFRAN) tablet 4 mg (has no administration in time range)    Or  ondansetron (ZOFRAN) injection 4 mg (has no administration in time range)  busPIRone (BUSPAR) tablet 10 mg (10 mg Oral Given 02/28/21 0834)  albuterol (PROVENTIL) (2.5 MG/3ML) 0.083% nebulizer solution 3 mL (has no administration in time range)  pantoprazole (PROTONIX) EC tablet 80 mg (80 mg Oral Given 02/28/21 1236)  furosemide (LASIX) tablet 20 mg (20 mg Oral Given 02/28/21 0835)  ferrous sulfate tablet 325 mg (has no administration in time range)  cholecalciferol (VITAMIN D3) tablet 1,000 Units (1,000 Units Oral Given 02/28/21 0834)  darifenacin (ENABLEX) 24 hr tablet 15 mg (15 mg Oral Given 02/28/21 1039)  sertraline (ZOLOFT) tablet 100 mg (100 mg Oral Given 02/28/21 0834)  pravastatin (PRAVACHOL) tablet 20 mg (20 mg Oral Given 02/27/21 2254)  lisinopril (ZESTRIL) tablet 5 mg (5 mg Oral Given 02/28/21 0835)  levothyroxine (SYNTHROID) tablet 88 mcg (88 mcg Oral Given 02/28/21 0550)  loratadine (CLARITIN) tablet 10 mg (10 mg Oral Given  02/27/21 2307)  vitamin B-12 (CYANOCOBALAMIN) tablet 1,000 mcg (1,000 mcg Oral Given 02/28/21 0834)  zonisamide (ZONEGRAN) capsule 200 mg (200 mg Oral Given 02/28/21 0834)  dexamethasone (DECADRON) tablet 2 mg (2 mg Oral Given 02/28/21 0835)  HYDROcodone-acetaminophen (NORCO/VICODIN) 5-325 MG per tablet 1 tablet (1 tablet Oral Given 02/28/21 1236)  levETIRAcetam (KEPPRA) IVPB 1000 mg/100 mL premix (0 mg Intravenous Stopped 02/27/21 1718)  divalproex (DEPAKOTE) DR tablet 2,000 mg (2,000 mg Oral Given 02/27/21 2019)  metoCLOPramide (REGLAN) injection 5 mg (5 mg Intravenous Given 02/27/21 2106)  diphenhydrAMINE (BENADRYL) injection 12.5 mg (12.5 mg Intravenous Given 02/27/21 2107)  HYDROmorphone (DILAUDID) injection 1 mg (1 mg Intravenous Given 02/28/21 0234)    ED Course  I have reviewed the triage vital signs and the nursing notes.  Pertinent labs & imaging results that were available during my care of the patient were reviewed by me and considered in my medical decision making (see chart for details).    MDM Rules/Calculators/A&P                           75 yo F here with recent craniotomy for cancer comes in with a chief complaints of back to back seizures.  Reportedly had 4 without return to baseline.  Loaded with Keppra here.  CT scan of the head obtained acutely without obvious concerning change  from most recent.  Mental status is slowly improved while in the ED.  I discussed case with neurosurgery will come and evaluate at bedside.  Neurosurgery with no indication for surgical pathology.  Recommended discussion with neurology.  Neurology recommending loading on valproic acid and medical admission.  CRITICAL CARE Performed by: Cecilio Asper   Total critical care time: 35 minutes  Critical care time was exclusive of separately billable procedures and treating other patients.  Critical care was necessary to treat or prevent imminent or life-threatening  deterioration.  Critical care was time spent personally by me on the following activities: development of treatment plan with patient and/or surrogate as well as nursing, discussions with consultants, evaluation of patient's response to treatment, examination of patient, obtaining history from patient or surrogate, ordering and performing treatments and interventions, ordering and review of laboratory studies, ordering and review of radiographic studies, pulse oximetry and re-evaluation of patient's condition.  The patients results and plan were reviewed and discussed.   Any x-rays performed were independently reviewed by myself.   Differential diagnosis were considered with the presenting HPI.  Medications  iohexol (OMNIPAQUE) 350 MG/ML injection 70 mL (has no administration in time range)  divalproex (DEPAKOTE) DR tablet 500 mg (500 mg Oral Given 02/28/21 0834)  acetaminophen (TYLENOL) tablet 650 mg (650 mg Oral Given 02/28/21 0835)    Or  acetaminophen (TYLENOL) suppository 650 mg ( Rectal See Alternative 02/28/21 0835)  ondansetron (ZOFRAN) tablet 4 mg (has no administration in time range)    Or  ondansetron (ZOFRAN) injection 4 mg (has no administration in time range)  busPIRone (BUSPAR) tablet 10 mg (10 mg Oral Given 02/28/21 0834)  albuterol (PROVENTIL) (2.5 MG/3ML) 0.083% nebulizer solution 3 mL (has no administration in time range)  pantoprazole (PROTONIX) EC tablet 80 mg (80 mg Oral Given 02/28/21 1236)  furosemide (LASIX) tablet 20 mg (20 mg Oral Given 02/28/21 0835)  ferrous sulfate tablet 325 mg (has no administration in time range)  cholecalciferol (VITAMIN D3) tablet 1,000 Units (1,000 Units Oral Given 02/28/21 0834)  darifenacin (ENABLEX) 24 hr tablet 15 mg (15 mg Oral Given 02/28/21 1039)  sertraline (ZOLOFT) tablet 100 mg (100 mg Oral Given 02/28/21 0834)  pravastatin (PRAVACHOL) tablet 20 mg (20 mg Oral Given 02/27/21 2254)  lisinopril (ZESTRIL) tablet 5 mg (5 mg Oral  Given 02/28/21 0835)  levothyroxine (SYNTHROID) tablet 88 mcg (88 mcg Oral Given 02/28/21 0550)  loratadine (CLARITIN) tablet 10 mg (10 mg Oral Given 02/27/21 2307)  vitamin B-12 (CYANOCOBALAMIN) tablet 1,000 mcg (1,000 mcg Oral Given 02/28/21 0834)  zonisamide (ZONEGRAN) capsule 200 mg (200 mg Oral Given 02/28/21 0834)  dexamethasone (DECADRON) tablet 2 mg (2 mg Oral Given 02/28/21 0835)  HYDROcodone-acetaminophen (NORCO/VICODIN) 5-325 MG per tablet 1 tablet (1 tablet Oral Given 02/28/21 1236)  levETIRAcetam (KEPPRA) IVPB 1000 mg/100 mL premix (0 mg Intravenous Stopped 02/27/21 1718)  divalproex (DEPAKOTE) DR tablet 2,000 mg (2,000 mg Oral Given 02/27/21 2019)  metoCLOPramide (REGLAN) injection 5 mg (5 mg Intravenous Given 02/27/21 2106)  diphenhydrAMINE (BENADRYL) injection 12.5 mg (12.5 mg Intravenous Given 02/27/21 2107)  HYDROmorphone (DILAUDID) injection 1 mg (1 mg Intravenous Given 02/28/21 0234)    Vitals:   02/28/21 0330 02/28/21 0737 02/28/21 0737 02/28/21 1159  BP: (!) 110/56  (!) 122/57 (!) 108/56  Pulse: (!) 57  (!) 52 (!) 56  Resp: 14  17 17   Temp: 98 F (36.7 C) 98.7 F (37.1 C) 98.7 F (37.1 C) 98.3 F (36.8 C)  TempSrc: Oral  Oral Oral  SpO2: 95%  97% 99%    Final diagnoses:  Seizure [facility])    Admission/ observation were discussed with the admitting physician, patient and/or family and they are comfortable with the plan.    Final Clinical Impression(s) / ED Diagnoses Final diagnoses:  Seizure [facility])    Rx / [place] Orders ED Discharge Orders     None        Deno Etienne, DO 02/28/21 1505

## 2021-02-27 NOTE — H&P (Signed)
History and Physical    Beverly Ballard UXL:[id number] DOB: 1945/07/16 DOA: 02/27/2021  PCP: Caren Macadam, MD  Patient coming from: Home  I have personally briefly reviewed patient's old medical records in [place]  Chief Complaint: Seizures  HPI: Beverly Ballard is a 75 y.o. female with medical history significant of PAF, HTN, HLD, prior seizures.  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  Pt discharged earlier today from NS service.  Pt got home, had ~4 back to back seizures.  EMS called.  Pt given versed on way to ED.   ED Course: In ED, pt initially post ictal, now more awake and alert though still with some mild confusion.  Neurology recs Depakote and overnight obs.  No CP, no SOB.   Review of Systems: As per HPI, otherwise all review of systems negative.  Past Medical History:  Diagnosis Date   Allergy    Anemia    Anxiety and depression 12/19/2009   Arthritis    Cancer ([place])    vaginal   Degenerative disorder of eye    Diverticulosis    Dysrhythmia 01/2021   Atrial fib/flutter   Essential hypertension, benign 06/26/2009   GERD 05/27/2007   takes Nexium daily   History of gout    History of migraine many yrs ago   Hyperlipidemia    takes Pravastatin daily   Hypothyroidism    Insomnia    but doesn't take any meds   LOW BACK PAIN 05/27/2007   Osteoporosis    Seizures ([place])    last 1983, none since then- last seizure 1988 per pt    Sleep apnea    wears CPAP    Past Surgical History:  Procedure Laterality Date   ADENOIDECTOMY     APPLICATION OF CRANIAL NAVIGATION N/A 02/22/2021   Procedure: APPLICATION OF CRANIAL NAVIGATION;  Surgeon: Eustace Moore, MD;  Location: [place];  Service: Neurosurgery;  Laterality: N/A;   BACK SURGERY     lumbar x2   BRONCHIAL BIOPSY  09/12/2019   Procedure: BRONCHIAL BIOPSIES;  Surgeon: Laurin Coder, MD;  Location: [facility];  Service: Endoscopy;;    BRONCHIAL WASHINGS  09/12/2019   Procedure: BRONCHIAL WASHINGS;  Surgeon: Laurin Coder, MD;  Location: [facility];  Service: Endoscopy;;   COLONOSCOPY  2009   CRANIOTOMY N/A 02/22/2021   Procedure: CRANIOTOMY FOR TUMOR EXCISION;  Surgeon: Eustace Moore, MD;  Location: [place];  Service: Neurosurgery;  Laterality: N/A;   ESOPHAGOGASTRODUODENOSCOPY     Heel spurs Bilateral    HEMOSTASIS CONTROL  09/12/2019   Procedure: HEMOSTASIS CONTROL;  Surgeon: Laurin Coder, MD;  Location: [facility];  Service: Endoscopy;;  epi   KNEE ARTHROPLASTY Left 11/07/2013   Procedure: COMPUTER ASSISTED TOTAL KNEE ARTHROPLASTY;  Surgeon: Marybelle Killings, MD;  Location: [place];  Service: Orthopedics;  Laterality: Left;  Left Total Knee Arthroplasty, Cemented, Computer Assist   TONSILLECTOMY     TOTAL KNEE REVISION Left 09/19/2014   Procedure: LEFT TOTAL KNEE REVISION;  Surgeon: Leandrew Koyanagi, MD;  Location: [place];  Service: Orthopedics;  Laterality: Left;   UPPER GASTROINTESTINAL ENDOSCOPY     VIDEO BRONCHOSCOPY N/A 09/12/2019   Procedure: VIDEO BRONCHOSCOPY WITH FLUORO;  Surgeon: Laurin Coder, MD;  Location: [facility];  Service: Endoscopy;  Laterality: N/A;     reports that she quit smoking about 42 years ago. Her smoking use included  cigarettes. She has a 5.00 pack-year smoking history. She has never used smokeless tobacco. She reports that she does not drink alcohol and does not use drugs.  Allergies  Allergen Reactions   Lamotrigine Swelling    Tongue swelling   Bupropion Other (See Comments)    Unknown reaction   Carbamazepine Other (See Comments)    Hypnatremia   Levetiracetam Other (See Comments)    dizziness   Tramadol Other (See Comments)    Unknown reaction   Adhesive [Tape] Other (See Comments)    ?  Blister after knee surgery   Amitriptyline Nausea And Vomiting   Clarithromycin Other (See Comments)    Unknown reaction     Doxycycline Other (See Comments)    Interfered with  seizure medication   Nickel Other (See Comments)    Had to remove knee implant with nickel and replace it   Other Other (See Comments)    Allergic to Metal and perfumes (unknown reaction)   Topamax [Topiramate] Nausea And Vomiting   Estradiol Rash   Latex Rash   Phenytoin Sodium Extended Other (See Comments)    drowsiness    Family History  Problem Relation Age of Onset   Aortic dissection Father    Arthritis Mother    Stroke Mother 70   Heart murmur Other    Throat cancer Sister    Colon cancer Neg Hx    Esophageal cancer Neg Hx    Stomach cancer Neg Hx    Rectal cancer Neg Hx    Colon polyps Neg Hx      Prior to Admission medications   Medication Sig Start Date End Date Taking? Authorizing Provider  albuterol (VENTOLIN HFA) 108 (90 Base) MCG/ACT inhaler Inhale 1-2 puffs into the lungs every 6 (six) hours as needed for wheezing or shortness of breath. 05/11/20  Yes Providence Lanius A, PA-C  busPIRone (BUSPAR) 10 MG tablet TAKE 1 TABLET BY MOUTH TWICE A DAY Patient taking differently: Take 10 mg by mouth 2 (two) times daily. 02/18/21  Yes Donnal Moat T, PA-C  cholecalciferol (VITAMIN D3) 25 MCG (1000 UNIT) tablet Take 1,000 Units by mouth every morning.   Yes [provider]  EPINEPHrine 0.3 mg/0.3 mL IJ SOAJ injection Inject 0.3 mg into the muscle once as needed for anaphylaxis. 09/14/19  Yes [provider]  esomeprazole (NEXIUM) 20 MG capsule Take 2 capsules (40 mg total) by mouth daily before breakfast. Patient taking differently: Take 20 mg by mouth daily before breakfast. 03/21/19  Yes Laurey Morale, MD  ferrous sulfate 325 (65 FE) MG tablet Take 325 mg by mouth 3 (three) times a week.   Yes [provider]  furosemide (LASIX) 20 MG tablet TAKE 1 TABLET BY MOUTH IN THE MORNING 01/23/20  Yes Mannam, Praveen, MD  HYDROcodone-acetaminophen (NORCO/VICODIN) 5-325 MG tablet Take 1 tablet by mouth every 8 (eight) hours as needed for moderate pain.  02/27/21  Yes Eustace Moore, MD  levocetirizine (XYZAL) 5 MG tablet Take 5 mg by mouth at bedtime. 03/05/16  Yes [provider]  levothyroxine (SYNTHROID) 88 MCG tablet Take 1 tablet (88 mcg total) by mouth daily. Patient taking differently: Take 88 mcg by mouth daily before breakfast. 10/11/20  Yes Philemon Kingdom, MD  lisinopril (ZESTRIL) 5 MG tablet TAKE 1 TABLET BY MOUTH EVERY DAY Patient taking differently: Take 5 mg by mouth every morning. 10/19/20  Yes Koberlein, Junell C, MD  pravastatin (PRAVACHOL) 20 MG tablet TAKE 1 TABLET (20 MG  TOTAL) BY MOUTH DAILY. Patient taking differently: Take 20 mg by mouth at bedtime. 10/19/20  Yes Koberlein, Junell C, MD  sertraline (ZOLOFT) 100 MG tablet Take 2 tablets (200 mg total) by mouth daily. Patient taking differently: Take 100 mg by mouth 2 (two) times daily. 09/04/20  Yes Donnal Moat T, PA-C  solifenacin (VESICARE) 10 MG tablet Take 10 mg by mouth at bedtime. 01/24/21  Yes [provider]  vitamin B-12 (CYANOCOBALAMIN) 1000 MCG tablet Take 1,000 mcg by mouth every morning.   Yes [provider]  zonisamide (ZONEGRAN) 100 MG capsule Take 2 capsules twice a day Patient taking differently: Take 200 mg by mouth 2 (two) times daily. Seizure medication 11/07/20  Yes Cameron Sprang, MD  Budeson-Glycopyrrol-Formoterol (BREZTRI AEROSPHERE) 160-9-4.8 MCG/ACT AERO Inhale 2 puffs into the lungs 2 (two) times daily. Patient not taking: No sig reported 12/04/20   Caren Macadam, MD  dexamethasone (DECADRON) 4 MG tablet Take 0.5 tablets (2 mg total) by mouth 2 (two) times daily. Patient not taking: No sig reported 02/27/21   Eustace Moore, MD  Spacer/Aero-Holding Chambers (AEROCHAMBER PLUS) inhaler Use as instructed 11/16/20   Caren Macadam, MD    Physical Exam: Vitals:   02/27/21 1945 02/27/21 2000 02/27/21 2115 02/27/21 2130  BP: 136/62 112/64 135/67 125/66  Pulse: (!) 58 (!) 58 61 (!) 57  Resp: 14 (!) 33 16 15  Temp:       TempSrc:      SpO2: 97% 98% 100% 95%    Constitutional: NAD, calm, comfortable Eyes: PERRL, lids and conjunctivae normal ENMT: Mucous membranes are moist. Posterior pharynx clear of any exudate or lesions.Normal dentition.  Neck: normal, supple, no masses, no thyromegaly Respiratory: clear to auscultation bilaterally, no wheezing, no crackles. Normal respiratory effort. No accessory muscle use.  Cardiovascular: Regular rate and rhythm, no murmurs / rubs / gallops. No extremity edema. 2+ pedal pulses. No carotid bruits.  Abdomen: no tenderness, no masses palpated. No hepatosplenomegaly. Bowel sounds positive.  Musculoskeletal: no clubbing / cyanosis. No joint deformity upper and lower extremities. Good ROM, no contractures. Normal muscle tone.  Skin: no rashes, lesions, ulcers. No induration Neurologic: mild confusion, no gaze preference noted, moving both sides now, following commands, answering questions for pharm. Psychiatric: Normal judgment and insight. Alert and oriented x 3. Normal mood.    Labs on Admission: I have personally reviewed following labs and imaging studies  CBC: Recent Labs  Lab 02/27/21 1627  WBC 14.4*  NEUTROABS 11.9*  HGB 13.9  HCT 43.5  MCV 99.8  PLT 275   Basic Metabolic Panel: Recent Labs  Lab 02/25/21 0407 02/27/21 1627  NA 141 138  K 4.2 3.7  CL 107 101  CO2 30 28  GLUCOSE 112* 101*  BUN 14 18  CREATININE 0.92 0.93  CALCIUM 8.8* 8.5*  MG 2.1 2.2   GFR: Estimated Creatinine Clearance: 64.5 mL/min (by C-G formula based on SCr of 0.93 mg/dL). Liver Function Tests: Recent Labs  Lab 02/27/21 1627  AST 21  ALT 16  ALKPHOS 73  BILITOT 0.4  PROT 6.8  ALBUMIN 3.4*   No results for input(s): LIPASE, AMYLASE in the last 168 hours. No results for input(s): AMMONIA in the last 168 hours. Coagulation Profile: No results for input(s): INR, PROTIME in the last 168 hours. Cardiac Enzymes: No results for input(s): CKTOTAL, CKMB, CKMBINDEX,  TROPONINI in the last 168 hours. BNP (last 3 results) No results for input(s): PROBNP in the last  8760 hours. HbA1C: No results for input(s): HGBA1C in the last 72 hours. CBG: No results for input(s): GLUCAP in the last 168 hours. Lipid Profile: No results for input(s): CHOL, HDL, LDLCALC, TRIG, CHOLHDL, LDLDIRECT in the last 72 hours. Thyroid Function Tests: Recent Labs    02/26/21 0437  TSH 0.819   Anemia Panel: No results for input(s): VITAMINB12, FOLATE, FERRITIN, TIBC, IRON, RETICCTPCT in the last 72 hours. Urine analysis:    Component Value Date/Time   COLORURINE YELLOW 02/27/2021 1800   APPEARANCEUR CLEAR 02/27/2021 1800   LABSPEC 1.009 02/27/2021 1800   PHURINE 7.0 02/27/2021 1800   GLUCOSEU NEGATIVE 02/27/2021 1800   GLUCOSEU Negative 07/08/2019 1436   HGBUR NEGATIVE 02/27/2021 1800   BILIRUBINUR NEGATIVE 02/27/2021 1800   BILIRUBINUR n 08/14/2014 1154   KETONESUR NEGATIVE 02/27/2021 1800   PROTEINUR NEGATIVE 02/27/2021 1800   UROBILINOGEN 0.2 mg/dL (A) 07/08/2019 1436   NITRITE NEGATIVE 02/27/2021 1800   LEUKOCYTESUR NEGATIVE 02/27/2021 1800    Radiological Exams on Admission: CT Head Wo Contrast  Result Date: 02/27/2021 CLINICAL DATA:  Seizure.  Abnormal neurological exam. EXAM: CT HEAD WITHOUT CONTRAST TECHNIQUE: Contiguous axial images were obtained from the base of the skull through the vertex without intravenous contrast. COMPARISON:  02/22/2021.  02/23/2021. FINDINGS: Brain: Previous left parieto-occipital craniotomy for tumor debulking and biopsy. Edema, air and a small amount of blood products are present in the region of the surgery as expected. Regional vasogenic edema remains evident. Without contrast, residual enhancing tumor in the region previously shown cannot be assessed. No unexpected or worsening finding. The remainder the brain appears negative. No hydrocephalus or extra-axial collection. Vascular: No abnormal vascular finding. Skull: Otherwise  negative. Sinuses/Orbits: Clear/normal Other: None IMPRESSION: No unexpected finding. Recent left parieto-occipital craniotomy for tumor resection/debulking. Edema, small amount of air and small amount of blood remaining evident at the surgical site as expected. No worsening or unexpected finding. Residual enhancing tumor previously shown cannot be assessed on this noncontrast exam. The remainder of the brain appears negative. Electronically Signed   By: Nelson Chimes M.D.   On: 02/27/2021 17:33   DG Chest Port 1 View  Result Date: 02/27/2021 CLINICAL DATA:  Difficulty breathing, seizures EXAM: PORTABLE CHEST 1 VIEW COMPARISON:  Previous studies including the radiograph done on 02/16/2021 and CT done on 02/18/2021 FINDINGS: Transverse diameter of heart is increased. Increased interstitial and alveolar densities seen in both lungs, more so on the right side. There is slight improvement in infiltrates in both lungs. There is poor inspiration. Left lateral CP angle is not included. Right lateral CP angle is clear. There is no pneumothorax. IMPRESSION: Cardiomegaly. Increased interstitial markings are seen in both lungs, more so on the right side suggesting scarring. There is slight improvement in aeration of parahilar regions which may be due to slightly better inspiration or suggest decrease in superimposed interstitial pneumonitis. No new focal infiltrates are seen. Electronically Signed   By: Elmer Picker M.D.   On: 02/27/2021 16:51    EKG: Independently reviewed.  Assessment/Plan Principal Problem:   Seizures ([place]) Active Problems:   Essential hypertension   Brain mass   Atrial flutter ([facility])   S/P craniotomy    Seizures - Depakote per neuro recs Seizure precautions Tele monitor Brain mass s/p craniotomy - Suspected GBM Decadron 2mg  BID for 15 days ordered (based on discharge orders) HTN - Cont home BP meds PAF - Tele monitor Previously self limited Looks like cards wants a halter  monitor based on  Dr. Percival Spanish note from earlier today. Doesn't look like they felt strongly that she needed to be on [facility] at this point So will hold off  DVT prophylaxis: SCDs Code Status: Full Family Communication: Husband at bedside Disposition Plan: Home after seizure obs Consults called: Neurology Admission status: Place in 72    Laquan Beier, [facility]  How to contact the [facility] Attending or Consulting provider [place] or covering provider during after hours [place], for this patient?  Check the care team in [facility] and look for a) attending/consulting [facility] provider listed and b) the [facility] team listed Log into [email or web address]  Amion Physician Scheduling and messaging for groups and whole hospitals  On call and physician scheduling software for group practices, residents, hospitalists and other medical providers for call, clinic, rotation and shift schedules. On[employer] is a hospital-wide system for scheduling doctors and paging doctors on call. EasyPlot is for scientific plotting and data analysis.  [email or web address]  and use [place]'s universal password to access. If you do not have the password, please contact the hospital operator.  Locate the [facility] provider you are looking for under [facility] and page to a number that you can be directly reached. If you still have difficulty reaching the provider, please page the [facility] (Director on Call) for the Hospitalists listed on amion for assistance.  02/27/2021, 9:44 PM

## 2021-02-27 NOTE — Progress Notes (Signed)
Received pt from the ED, alert and oriented, implemented MD orders, call light in reach, seizure precautions in place  02/27/21 2208  Vitals  Temp 98.2 F (36.8 C)  Temp Source Oral  BP 132/62  MAP (mmHg) 82  BP Location Right Arm  BP Method Automatic  Patient Position (if appropriate) Lying  Pulse Rate (!) 56  Pulse Rate Source Dinamap  ECG Heart Rate (!) 57  Resp 17  MEWS COLOR  MEWS Score Color Green  Oxygen Therapy  SpO2 100 %  O2 Device Room Air  MEWS Score  MEWS Temp 0  MEWS Systolic 0  MEWS Pulse 0  MEWS RR 0  MEWS LOC 0  MEWS Score 0

## 2021-02-27 NOTE — Discharge Summary (Signed)
Physician Discharge Summary  Patient ID: Beverly Ballard MRN: [id number] DOB/AGE: 07/02/1945 75 y.o.  Admit date: 02/22/2021 Discharge date: 02/27/2021  Admission Diagnoses:  Left posterior parietal enhancing brain mass     Discharge Diagnoses: same   Discharged Condition: good  Hospital Course: The patient was admitted on 02/22/2021 and taken to the operating room where the patient underwent left posterior parietal craniotomy for tumor resection. The patient tolerated the procedure well and was taken to the recovery room and then to the ICU in stable condition and then transferred to the floor after 2 days in the ICU. The hospital course was routine. There were no complications. The wound remained clean dry and intact. Pt had appropriate head soreness. No complaints of new pain or new N/T/W. The patient remained afebrile with stable vital signs, and tolerated a regular diet. The patient continued to increase activities, and pain was well controlled with oral pain medications. Pathology results pending but suspect GBM.  Consults: cardiology  Significant Diagnostic Studies:  Results for orders placed or performed during the hospital encounter of 02/22/21  SARS Coronavirus 2 by RT PCR (hospital order, performed in [facility] hospital lab) Nasopharyngeal Nasopharyngeal Swab   Specimen: Nasopharyngeal Swab  Result Value Ref Range   SARS Coronavirus 2 NEGATIVE NEGATIVE  MRSA Next Gen by PCR, Nasal   Specimen: Nasal Mucosa; Nasal Swab  Result Value Ref Range   MRSA by PCR Next Gen NOT DETECTED NOT DETECTED  Basic metabolic panel  Result Value Ref Range   Sodium 141 135 - 145 mmol/L   Potassium 4.2 3.5 - 5.1 mmol/L   Chloride 107 98 - 111 mmol/L   CO2 30 22 - 32 mmol/L   Glucose, Bld 112 (H) 70 - 99 mg/dL   BUN 14 8 - 23 mg/dL   Creatinine, Ser 0.92 0.44 - 1.00 mg/dL   Calcium 8.8 (L) 8.9 - 10.3 mg/dL   GFR, Estimated >60 >60 mL/min   Anion gap 4 (L) 5 - 15  Magnesium  Result Value  Ref Range   Magnesium 2.1 1.7 - 2.4 mg/dL  TSH  Result Value Ref Range   TSH 0.819 0.350 - 4.500 uIU/mL  ECHOCARDIOGRAM COMPLETE  Result Value Ref Range   Weight 3,671.98 oz   Height 65 in   BP 93/50 mmHg   S' Lateral 3.04 cm   AR max vel 1.94 cm2   AV Area VTI 2.03 cm2   AV Mean grad 5.0 mmHg   AV Peak grad 10.0 mmHg   Ao pk vel 1.58 m/s   Area-P 1/2 3.51 cm2   AV Area mean vel 1.94 cm2  Type and screen  Result Value Ref Range   ABO/RH(D) B NEG    Antibody Screen NEG    Sample Expiration      02/25/2021,2359 Performed at [facility], [address], [place], [place] [address]     CT HEAD WO CONTRAST (5MM)  Result Date: 02/16/2021 CLINICAL DATA:  Seizure. EXAM: CT HEAD WITHOUT CONTRAST TECHNIQUE: Contiguous axial images were obtained from the base of the skull through the vertex without intravenous contrast. COMPARISON:  March 03, 2020. FINDINGS: Brain: 20 x 15 mm rounded mass is noted in the left posterior parietal cortex with surrounding white matter edema concerning for malignancy or neoplasm. Ventricular size is within normal limits. No definite midline shift is noted. Small focus of subarachnoid hemorrhage may be present in this area. Vascular: No hyperdense vessel or unexpected calcification. Skull: Normal.  Negative for fracture or focal lesion. Sinuses/Orbits: No acute finding. Other: None. IMPRESSION: 2.0 x 1.5 cm rounded mass is noted in the left posterior parietal cortex with surrounding white matter edema concerning for neoplasm or malignancy. MRI with and without gadolinium is recommended for further evaluation. Also noted is possible small focus of subarachnoid hemorrhage in the left posterior parietal region. Critical Value/emergent results were called by telephone at the time of interpretation on 02/16/2021 at 6:56 pm to provider [facility] , who verbally acknowledged these results. Electronically Signed   By: Marijo Conception M.D.   On: 02/16/2021  18:56   MR BRAIN W WO CONTRAST  Result Date: 02/23/2021 CLINICAL DATA:  Postop day 1 following left parietal tumor resection. EXAM: MRI HEAD WITHOUT AND WITH CONTRAST TECHNIQUE: Multiplanar, multiecho pulse sequences of the brain and surrounding structures were obtained without and with intravenous contrast. CONTRAST:  39mL GADAVIST GADOBUTROL 1 MMOL/ML IV SOLN COMPARISON:  02/18/2021 FINDINGS: Brain: Sequelae of interval left parietal tumor resection are identified. A resection cavity is present containing blood products. There is a small amount restricted diffusion primarily inferior to the resection cavity measuring approximately 2.5 cm in size. There is a 1 cm ring-enhancing lesion lateral to the resection cavity in the left parietal lobe (series 18, image 28) with surrounding nonenhancing T2 hyperintensity involving cortex compatible with residual tumor. There is minimal extra-axial fluid subjacent to the craniotomy with mild dural enhancement which is likely postoperative. Mild-to-moderate edema in the left parietal lobe has mildly decreased. There is mild mass effect on the left lateral ventricle. There is no midline shift. Vascular: Major intracranial vascular flow voids are preserved. Skull and upper cervical spine: Left parietal craniotomy. Small amount of fluid in the overlying soft tissues. Skin staples. Sinuses/Orbits: Unremarkable orbits. Minimal mucosal thickening in the left maxillary sinus. Clear mastoid air cells. Other: None. IMPRESSION: 1. Postoperative changes from interval left parietal tumor resection. Evidence of some residual enhancing and nonenhancing tumor lateral to the resection cavity. 2. Mildly decreased edema. Electronically Signed   By: Logan Bores M.D.   On: 02/23/2021 17:54   MR BRAIN W WO CONTRAST  Result Date: 02/18/2021 CLINICAL DATA:  Brain mass or lesion EXAM: MRI HEAD WITHOUT AND WITH CONTRAST TECHNIQUE: Multiplanar, multiecho pulse sequences of the brain and  surrounding structures were obtained without and with intravenous contrast. CONTRAST:  61mL GADAVIST GADOBUTROL 1 MMOL/ML IV SOLN COMPARISON:  02/17/2021 FINDINGS: Brain: Redemonstrated heterogeneously enhancing mass in the left parietal lobe, which measures approximately 2.4 x 1.5 x 1.9 cm (AP x TR x CC) (series 11, image 33 and series 9, image 82), with adjacent enhancing lesions measuring up to 0.9 and 0.5 cm, unchanged. Hemosiderin deposition is seen within the largest lesion, consistent hemorrhage. Unchanged surrounding edema with mild local mass effect. No acute infarct, hydrocephalus, or midline shift. The basal cisterns are preserved. No new abnormal enhancement. Vascular: Normal flow voids. Skull and upper cervical spine: Normal marrow signal. Sinuses/Orbits: Mucosal thickening in the maxillary sinuses. The orbits are unremarkable Other: The mastoids are well aerated. IMPRESSION: Unchanged heterogeneously enhancing mass, which remains concerning for high-grade primary CNS neoplasm versus metastatic disease. Electronically Signed   By: Merilyn Baba M.D.   On: 02/18/2021 16:44   MR Brain W and Wo Contrast  Result Date: 02/17/2021 CLINICAL DATA:  Mental status change. Seizure. Abnormal CT scan. Mass lesion. Personal history of vaginal cancer. EXAM: MRI HEAD WITHOUT AND WITH CONTRAST TECHNIQUE: Multiplanar, multiecho pulse sequences of the brain  and surrounding structures were obtained without and with intravenous contrast. CONTRAST:  41mL GADAVIST GADOBUTROL 1 MMOL/ML IV SOLN COMPARISON:  CT head without contrast 02/16/2022 FINDINGS: Brain: A heterogeneously enhancing mass lesion is confirmed in the left parietal lobe measuring 2.0 x 2.2 x 1.6 cm. At least 2 adjacent peripherally enhancing lesions are noted more laterally, the larger measuring 10 mm. The smaller measuring 4 mm. The secondary lesions correspond with the area more lateral hyperdensity noted on the CT scan. Extensive surrounding edema is  present. No definite hemorrhage is present. There is some local mass effect with effacement of the sulci and partial effacement of the posterior horn of the left lateral ventricle. Minimal white matter disease is present otherwise within normal limits for age. No acute infarct is present. Basal ganglia are within normal limits. The internal auditory canals are within normal limits. The brainstem and cerebellum are within normal limits. No other areas of pathologic enhancement are present. The postcontrast images are somewhat degraded by patient motion. Vascular: Flow is present in the major intracranial arteries. Skull and upper cervical spine: The craniocervical junction is normal. Upper cervical spine is within normal limits. Marrow signal is unremarkable. Sinuses/Orbits: Small fluid level is present left maxillary sinus. There is some mucosal thickening in the anterior ethmoid air cells and inferior right frontal sinus. The paranasal sinuses and mastoid air cells are otherwise clear. The globes and orbits are within normal limits. IMPRESSION: 1. 2.0 x 2.2 x 1.6 cm heterogeneously enhancing mass lesion in the left parietal lobe with surrounding edema and local mass effect. Differential diagnosis includes high-grade primary CNS neoplasm versus metastatic disease. At least 2 adjacent lesions are present. 2. Marked surrounding vasogenic edema with mass effect as described. No midline shift. 3. Mild sinus disease as described. Electronically Signed   By: San Morelle M.D.   On: 02/17/2021 12:13   CT CHEST ABDOMEN PELVIS W CONTRAST  Result Date: 02/18/2021 CLINICAL DATA:  Metastatic disease evaluation. Witnessed seizure. History of vaginal cancer. Left parietal intracranial mass. EXAM: CT CHEST, ABDOMEN, AND PELVIS WITH CONTRAST TECHNIQUE: Multidetector CT imaging of the chest, abdomen and pelvis was performed following the standard protocol during bolus administration of intravenous contrast. CONTRAST:   140mL OMNIPAQUE IOHEXOL 350 MG/ML SOLN COMPARISON:  Chest radiography 12/17/2020. CT chest 01/01/2021. CT abdomen 05/11/2020. FINDINGS: CT CHEST FINDINGS Cardiovascular: Heart size is normal. No pericardial fluid. Coronary artery calcification is present. Aortic atherosclerotic calcification is present. No pulmonary emboli are seen. Pulmonary artery remains prominent, suggesting a degree of pulmonary arterial hypertension. Mediastinum/Nodes: No mediastinal or hilar mass or lymphadenopathy. Lungs/Pleura: Redemonstration of an upper lobe predominant fibrotic pattern, unchanged since the previous study. This could be due to atypical pulmonary fibrosis, or chronic hypersensitivity pneumonitis. There is no evidence of pulmonary metastatic disease. No pleural effusion. No collapse or pneumonia. Musculoskeletal: No evidence of osseous metastatic disease within the chest. CT ABDOMEN PELVIS FINDINGS Hepatobiliary: No liver lesion. No calcified gallstones. No ductal dilatation. Pancreas: Normal Spleen: Normal Adrenals/Urinary Tract: Adrenal glands are normal. Kidneys are normal. No cyst, mass, stone or hydronephrosis. Bladder is normal. Stomach/Bowel: Stomach and small intestine are normal. Colon is normal. Vascular/Lymphatic: Aortic atherosclerosis. No aneurysm. IVC is normal. No retroperitoneal adenopathy. Reproductive: Normal.  No pelvic mass. Other: No free fluid or air.  No hernia. Musculoskeletal: Previous discectomy and fusion procedure at L4-5. No evidence of lumbosacral metastatic disease. No pelvic bone or proximal femoral lesion. IMPRESSION: No evidence of metastatic disease or primary mass in the chest,  abdomen or pelvis. Chronic pulmonary fibrosis or sensitivity pneumonitis changes as described at previous dedicated chest CT. No active process otherwise. Chronic pulmonary artery prominence suggests a degree of pulmonary arterial hypertension. Aortic atherosclerosis and coronary artery calcification. No abdominal  or pelvic organ pathology seen. Aortic atherosclerotic changes. Previous L4-5 discectomy and fusion. No evidence of skeletal metastatic disease. Electronically Signed   By: Nelson Chimes M.D.   On: 02/18/2021 10:28   DG Chest Portable 1 View  Result Date: 02/16/2021 CLINICAL DATA:  Hypoxia and seizure EXAM: PORTABLE CHEST 1 VIEW COMPARISON:  11/21/2020 FINDINGS: The cardiomediastinal silhouette is unchanged. Increasing interstitial and airspace opacities bilaterally are noted. Elevated RIGHT hemidiaphragm is again noted. No pneumothorax or large pleural effusion. IMPRESSION: Increasing interstitial and airspace opacities bilaterally which may represent edema versus infection/pneumonia. Electronically Signed   By: Margarette Canada M.D.   On: 02/16/2021 18:19   EEG adult  Result Date: 02/18/2021 Lora Havens, MD     02/18/2021  1:32 PM Patient Name: Beverly Ballard MRN: [id number] Epilepsy Attending: Lora Havens Referring Physician/Provider: Dr Donnetta Simpers Date: 02/18/2021 Duration: 23.36 mins Patient history: a 75 y.o. female with PMH significant for anxiety, depression, constipation, hypertension, hypothyroidism, sleep apnea, seizures on zonisamide 100 mg twice daily who presented with 3 seizures. Found to have a enhancing lesion in left parietal lobe concerning for malignancy.  EEG to evaluate for seizures. Level of alertness: Awake, asleep AEDs during EEG study: ZNS Technical aspects: This EEG study was done with scalp electrodes positioned according to the 10-20 International system of electrode placement. Electrical activity was acquired at a sampling rate of 500Hz  and reviewed with a high frequency filter of 70Hz  and a low frequency filter of 1Hz . EEG data were recorded continuously and digitally stored. Description: The posterior dominant rhythm consists of 9-10 Hz activity of moderate voltage (25-35 uV) seen predominantly in posterior head regions, asymmetric ( left<right) and reactive to eye  opening and eye closing. Sleep was characterized by vertex waves, sleep spindles (12 to 14 Hz), maximal frontocentral region.  EEG showed near continuous left hemisphere, maximal left parietal 3 to 5 hz theta-delta slowing. One burst of generalized polyspikes was noted.  Single left centro- parietal spike was also noted. Hyperventilation and photic stimulation were not performed.   ABNORMALITY -Polyspikes, generalized -Spike, left centro- parietal region -Continuous slow, left hemisphere, maximal left parietal region IMPRESSION: This study is consistent with patient's known history of generalized epilepsy.  Additionally there is single spike in left centro-parietal region which could be part of the generalized epilepsy. However due to underlying left parietal lesion, this could also be due to independent epilepsy arising from  left centro- parietal region.  There is also evidence of cortical dysfunction in left hemisphere, maximal left parietal region likely secondary to underlying structural abnormality.  No seizures were seen during the study. Lora Havens   ECHOCARDIOGRAM COMPLETE  Result Date: 02/25/2021    ECHOCARDIOGRAM REPORT   Patient Name:   Beverly Ballard Date of Exam: 02/25/2021 Medical Rec #:  [id number]     Height:       65.0 in Accession #:    [id number]    Weight:       229.5 lb Date of Birth:  Nov 10, 1945    BSA:          2.097 m Patient Age:    51 years      BP:  93/50 mmHg Patient Gender: F             HR:           59 bpm. Exam Location:  Inpatient Procedure: 2D Echo, Cardiac Doppler, Color Doppler and Intracardiac            Opacification Agent Indications:    Atrial fibrillation  History:        Patient has prior history of Echocardiogram examinations, most                 recent 10/18/2019. Risk Factors:Hypertension, Dyslipidemia,                 Former Smoker and Obesity. Brain tumor post removal 02/22/21.  Sonographer:    Merrie Roof RDCS Referring Phys: [place]  1. Left ventricular ejection fraction, by estimation, is 60 to 65%. The left ventricle has normal function. The left ventricle has no regional wall motion abnormalities. Left ventricular diastolic parameters are consistent with Grade II diastolic dysfunction (pseudonormalization). Elevated left ventricular end-diastolic pressure.  2. Right ventricular systolic function is normal. The right ventricular size is normal.  3. Left atrial size was mildly dilated.  4. The mitral valve is abnormal. Mild mitral valve regurgitation. No evidence of mitral stenosis.  5. Small gradient across valve but leaflets appear to open well on basal short axis views. The aortic valve is tricuspid. There is mild calcification of the aortic valve. Aortic valve regurgitation is not visualized. Mild to moderate aortic valve sclerosis/calcification is present, without any evidence of aortic stenosis.  6. The inferior vena cava is normal in size with greater than 50% respiratory variability, suggesting right atrial pressure of 3 mmHg. FINDINGS  Left Ventricle: Left ventricular ejection fraction, by estimation, is 60 to 65%. The left ventricle has normal function. The left ventricle has no regional wall motion abnormalities. The left ventricular internal cavity size was normal in size. There is  no left ventricular hypertrophy. Left ventricular diastolic parameters are consistent with Grade II diastolic dysfunction (pseudonormalization). Elevated left ventricular end-diastolic pressure. Right Ventricle: The right ventricular size is normal. No increase in right ventricular wall thickness. Right ventricular systolic function is normal. Left Atrium: Left atrial size was mildly dilated. Right Atrium: Right atrial size was normal in size. Pericardium: There is no evidence of pericardial effusion. Mitral Valve: The mitral valve is abnormal. There is mild thickening of the mitral valve leaflet(s). There is mild calcification of the mitral  valve leaflet(s). Mild mitral valve regurgitation. No evidence of mitral valve stenosis. Tricuspid Valve: The tricuspid valve is normal in structure. Tricuspid valve regurgitation is not demonstrated. No evidence of tricuspid stenosis. Aortic Valve: Small gradient across valve but leaflets appear to open well on basal short axis views. The aortic valve is tricuspid. There is mild calcification of the aortic valve. Aortic valve regurgitation is not visualized. Mild to moderate aortic valve sclerosis/calcification is present, without any evidence of aortic stenosis. Aortic valve mean gradient measures 5.0 mmHg. Aortic valve peak gradient measures 10.0 mmHg. Aortic valve area, by VTI measures 2.03 cm. Pulmonic Valve: The pulmonic valve was normal in structure. Pulmonic valve regurgitation is not visualized. No evidence of pulmonic stenosis. Aorta: The aortic root is normal in size and structure. Venous: The inferior vena cava is normal in size with greater than 50% respiratory variability, suggesting right atrial pressure of 3 mmHg. IAS/Shunts: The interatrial septum was not well visualized.  LEFT VENTRICLE PLAX 2D LVIDd:  4.59 cm   Diastology LVIDs:         3.04 cm   LV e' medial:    5.98 cm/s LV PW:         1.15 cm   LV E/e' medial:  20.1 LV IVS:        1.07 cm   LV e' lateral:   7.62 cm/s LVOT diam:     1.90 cm   LV E/e' lateral: 15.7 LV SV:         74 LV SV Index:   35 LVOT Area:     2.84 cm  RIGHT VENTRICLE RV Basal diam:  3.86 cm LEFT ATRIUM           Index LA diam:      4.10 cm 1.95 cm/m LA Vol (A4C): 51.6 ml 24.60 ml/m  AORTIC VALVE AV Area (Vmax):    1.94 cm AV Area (Vmean):   1.94 cm AV Area (VTI):     2.03 cm AV Vmax:           158.00 cm/s AV Vmean:          101.000 cm/s AV VTI:            0.364 m AV Peak Grad:      10.0 mmHg AV Mean Grad:      5.0 mmHg LVOT Vmax:         108.00 cm/s LVOT Vmean:        69.100 cm/s LVOT VTI:          0.260 m LVOT/AV VTI ratio: 0.71  AORTA Ao Root diam: 3.70 cm  Ao Asc diam:  3.60 cm MITRAL VALVE MV Area (PHT): 3.51 cm     SHUNTS MV Decel Time: 216 msec     Systemic VTI:  0.26 m MV E velocity: 120.00 cm/s  Systemic Diam: 1.90 cm MV A velocity: 78.20 cm/s MV E/A ratio:  1.53 Jenkins Rouge MD Electronically signed by Jenkins Rouge MD Signature Date/Time: 02/25/2021/5:26:48 PM    Final    CT BRAINLAB HEAD W/O CONTRAST (1MM)  Result Date: 02/22/2021 CLINICAL DATA:  Preop, brain surgery EXAM: CT HEAD WITHOUT CONTRAST TECHNIQUE: Contiguous axial images were obtained from the base of the skull through the vertex without intravenous contrast. COMPARISON:  02/16/2021 CT and 02/18/2021 MRI head FINDINGS: Brain: Redemonstrated mass in the left parietal lobe, measuring approximately 2.1 cm (series 7, image 66), with surrounding hypodensity, likely edema. Additional adjacent enhancing lesions noted on the 02/18/2021 MRI are not appreciated the CT. No significant midline shift. The subarachnoid hemorrhage seen 02/16/2021 exam is not well appreciated on the present exam. Vascular: No hyperdense vessel. Skull: No acute fracture or suspicious lesion. Sinuses/Orbits: Mild mucosal thickening in the left maxillary sinus. Otherwise clear. The orbits are unremarkable Other: The mastoids are well aerated. IMPRESSION: Redemonstrated 2.1 cm mass in the left posterior parietal lobe with surrounding edema, concerning for primary neoplasm or metastasis. Electronically Signed   By: Merilyn Baba M.D.   On: 02/22/2021 11:17    Antibiotics:  Anti-infectives (From admission, onward)    Start     Dose/Rate Route Frequency Ordered Stop   02/22/21 2200  ceFAZolin (ANCEF) IVPB 1 g/50 mL premix        1 g 100 mL/hr over 30 Minutes Intravenous Every 8 hours 02/22/21 1752 02/23/21 0656   02/22/21 0815  ceFAZolin (ANCEF) IVPB 2g/100 mL premix        2 g 200 mL/hr over 30  Minutes Intravenous On call to O.R. 02/22/21 0800 02/22/21 1410       Discharge Exam: Blood pressure (!) 116/49, pulse (!)  50, temperature 98 F (36.7 C), resp. rate 20, height 5\' 5"  (1.651 m), weight 104.1 kg, SpO2 96 %. Neurologic: Grossly normal Ambulating and voiding well, incision cdi, confused at times which is expected, right visual field cut.  Discharge Medications:   Allergies as of 02/27/2021       Reactions   Lamotrigine Swelling   Tongue swelling   Bupropion Other (See Comments)   Unknown reaction   Carbamazepine Other (See Comments)   Hypnatremia   Levetiracetam Other (See Comments)   dizziness   Tramadol Other (See Comments)   Unknown reaction   Adhesive [tape] Other (See Comments)   ?  Blister after knee surgery   Amitriptyline Nausea And Vomiting   Clarithromycin Other (See Comments)   Unknown reaction   Doxycycline Other (See Comments)   Interfered with seizure medication   Nickel Other (See Comments)   Had to remove knee implant with nickel and replace it   Other Other (See Comments)   Allergic to Metal and perfumes (unknown reaction)   Topamax [topiramate] Nausea And Vomiting   Estradiol Rash   Latex Rash   Phenytoin Sodium Extended Other (See Comments)   drowsiness        Medication List     TAKE these medications    AeroChamber Plus inhaler Use as instructed   albuterol 108 (90 Base) MCG/ACT inhaler Commonly known as: VENTOLIN HFA Inhale 1-2 puffs into the lungs every 6 (six) hours as needed for wheezing or shortness of breath.   Breztri Aerosphere 160-9-4.8 MCG/ACT Aero Generic drug: Budeson-Glycopyrrol-Formoterol Inhale 2 puffs into the lungs 2 (two) times daily.   busPIRone 15 MG tablet Commonly known as: BUSPAR Take 15 mg by mouth 2 (two) times daily.   busPIRone 10 MG tablet Commonly known as: BUSPAR TAKE 1 TABLET BY MOUTH TWICE A DAY   cholecalciferol 25 MCG (1000 UNIT) tablet Commonly known as: VITAMIN D3 Take 1,000 Units by mouth every morning.   dexamethasone 4 MG tablet Commonly known as: DECADRON Take 0.5 tablets (2 mg total) by mouth 2  (two) times daily. What changed:  how much to take when to take this   EPINEPHrine 0.3 mg/0.3 mL Soaj injection Commonly known as: EPI-PEN Inject 0.3 mg into the muscle once as needed for anaphylaxis.   esomeprazole 20 MG capsule Commonly known as: NEXIUM Take 2 capsules (40 mg total) by mouth daily before breakfast. What changed: how much to take   ferrous sulfate 325 (65 FE) MG tablet Take 325 mg by mouth 3 (three) times a week.   furosemide 20 MG tablet Commonly known as: LASIX TAKE 1 TABLET BY MOUTH IN THE MORNING   HYDROcodone-acetaminophen 5-325 MG tablet Commonly known as: NORCO/VICODIN Take 1 tablet by mouth every 8 (eight) hours as needed for moderate pain.   hyoscyamine 0.125 MG SL tablet Commonly known as: LEVSIN SL Place 1-2 tablets (0.125-0.25 mg total) under the tongue every 6 (six) hours as needed.   levocetirizine 5 MG tablet Commonly known as: XYZAL Take 5 mg by mouth at bedtime.   levothyroxine 88 MCG tablet Commonly known as: SYNTHROID Take 1 tablet (88 mcg total) by mouth daily. What changed: when to take this   lisinopril 5 MG tablet Commonly known as: ZESTRIL TAKE 1 TABLET BY MOUTH EVERY DAY What changed: when to take this  ondansetron 4 MG disintegrating tablet Commonly known as: Zofran ODT Take 1 tablet (4 mg total) by mouth every 6 (six) hours as needed for nausea or vomiting.   pravastatin 20 MG tablet Commonly known as: PRAVACHOL TAKE 1 TABLET (20 MG TOTAL) BY MOUTH DAILY. What changed: when to take this   sertraline 100 MG tablet Commonly known as: ZOLOFT Take 2 tablets (200 mg total) by mouth daily. What changed:  how much to take when to take this   solifenacin 10 MG tablet Commonly known as: VESICARE Take 10 mg by mouth at bedtime.   vitamin B-12 1000 MCG tablet Commonly known as: CYANOCOBALAMIN Take 1,000 mcg by mouth every morning.   zonisamide 100 MG capsule Commonly known as: ZONEGRAN Take 2 capsules twice a  day What changed:  how much to take how to take this when to take this additional instructions        Disposition: home   Final Dx: left posterior parietal craniotomy for tumor resection  Discharge Instructions     Call MD for:  difficulty breathing, headache or visual disturbances   Complete by: As directed    Call MD for:  persistant nausea and vomiting   Complete by: As directed    Call MD for:  redness, tenderness, or signs of infection (pain, swelling, redness, odor or green/yellow discharge around incision site)   Complete by: As directed    Call MD for:  severe uncontrolled pain   Complete by: As directed    Call MD for:  temperature >100.4   Complete by: As directed    Diet - low sodium heart healthy   Complete by: As directed    Discharge instructions   Complete by: As directed    No driving, no strenuous activity, may shower   Increase activity slowly   Complete by: As directed    No wound care   Complete by: As directed         Follow-up Information     [place], [place] Follow up.   Specialty: Home Health Services Why: Home Health physical and occupational therapies; agency will call you to arrange appointments. Contact information: [place] [address] [phone]         Eustace Moore, MD. Schedule an appointment as soon as possible for a visit in 1 week(s).   Specialty: Neurosurgery Contact information: [address] [place] [address] [phone]                  Signed: Ocie Cornfield [facility] 02/27/2021, 8:12 AM

## 2021-02-27 NOTE — TOC Transition Note (Signed)
Transition of Care [facility]) - CM/SW Discharge Note   Patient Details  Name: SHARIAH ASSAD MRN: [id number] Date of Birth: Aug 27, 1945  Transition of Care [facility]) CM/SW Contact:  Carles Collet, RN Phone Number: 02/27/2021, 9:12 AM   Clinical Narrative:    Notified [facility] that patient will DC to home today. No other TOC needs identified.     Final next level of care: Center Barriers to Discharge: No Barriers Identified   Patient Goals and CMS Choice Patient states their goals for this hospitalization and ongoing recovery are:: to go home CMS Medicare.gov Compare Post Acute Care list provided to:: Patient Choice offered to / list presented to : Patient  Discharge Placement                       Discharge Plan and Services   Discharge Planning Services: CM Consult Post Acute Care Choice: Home Health                    HH Arranged: PT, OT [facility] Agency: [place] Date [place]: 02/27/21 Time [place]: [phone] Representative spoke with at [place]: [place] Determinants of Health ([place]) Interventions     Readmission Risk Interventions No flowsheet data found.

## 2021-02-27 NOTE — ED Notes (Signed)
Patient transported to CT 

## 2021-02-27 NOTE — Progress Notes (Unsigned)
Patient enrolled for [employer] to mail a 14 day ZIO XT long term holter monitor to her address on file.  Dr. Percival Spanish to read.

## 2021-02-27 NOTE — Plan of Care (Signed)

## 2021-02-28 ENCOUNTER — Ambulatory Visit: Payer: Medicare HMO | Admitting: Physician Assistant

## 2021-02-28 DIAGNOSIS — I4892 Unspecified atrial flutter: Secondary | ICD-10-CM | POA: Diagnosis not present

## 2021-02-28 DIAGNOSIS — Z79899 Other long term (current) drug therapy: Secondary | ICD-10-CM | POA: Diagnosis not present

## 2021-02-28 DIAGNOSIS — Z885 Allergy status to narcotic agent status: Secondary | ICD-10-CM | POA: Diagnosis not present

## 2021-02-28 DIAGNOSIS — Z6838 Body mass index (BMI) 38.0-38.9, adult: Secondary | ICD-10-CM | POA: Diagnosis not present

## 2021-02-28 DIAGNOSIS — Z823 Family history of stroke: Secondary | ICD-10-CM | POA: Diagnosis not present

## 2021-02-28 DIAGNOSIS — Z8544 Personal history of malignant neoplasm of other female genital organs: Secondary | ICD-10-CM | POA: Diagnosis not present

## 2021-02-28 DIAGNOSIS — G936 Cerebral edema: Secondary | ICD-10-CM | POA: Diagnosis not present

## 2021-02-28 DIAGNOSIS — Z888 Allergy status to other drugs, medicaments and biological substances status: Secondary | ICD-10-CM | POA: Diagnosis not present

## 2021-02-28 DIAGNOSIS — E785 Hyperlipidemia, unspecified: Secondary | ICD-10-CM | POA: Diagnosis not present

## 2021-02-28 DIAGNOSIS — G40409 Other generalized epilepsy and epileptic syndromes, not intractable, without status epilepticus: Secondary | ICD-10-CM | POA: Diagnosis not present

## 2021-02-28 DIAGNOSIS — Z91040 Latex allergy status: Secondary | ICD-10-CM | POA: Diagnosis not present

## 2021-02-28 DIAGNOSIS — Z8261 Family history of arthritis: Secondary | ICD-10-CM | POA: Diagnosis not present

## 2021-02-28 DIAGNOSIS — Z20822 Contact with and (suspected) exposure to covid-19: Secondary | ICD-10-CM | POA: Diagnosis not present

## 2021-02-28 DIAGNOSIS — Z808 Family history of malignant neoplasm of other organs or systems: Secondary | ICD-10-CM | POA: Diagnosis not present

## 2021-02-28 DIAGNOSIS — E039 Hypothyroidism, unspecified: Secondary | ICD-10-CM | POA: Diagnosis not present

## 2021-02-28 DIAGNOSIS — Z79890 Hormone replacement therapy (postmenopausal): Secondary | ICD-10-CM | POA: Diagnosis not present

## 2021-02-28 DIAGNOSIS — R569 Unspecified convulsions: Secondary | ICD-10-CM | POA: Diagnosis not present

## 2021-02-28 DIAGNOSIS — I480 Paroxysmal atrial fibrillation: Secondary | ICD-10-CM | POA: Diagnosis present

## 2021-02-28 DIAGNOSIS — Z881 Allergy status to other antibiotic agents status: Secondary | ICD-10-CM | POA: Diagnosis not present

## 2021-02-28 DIAGNOSIS — Z96652 Presence of left artificial knee joint: Secondary | ICD-10-CM | POA: Diagnosis present

## 2021-02-28 DIAGNOSIS — I10 Essential (primary) hypertension: Secondary | ICD-10-CM | POA: Diagnosis not present

## 2021-02-28 DIAGNOSIS — E669 Obesity, unspecified: Secondary | ICD-10-CM | POA: Diagnosis not present

## 2021-02-28 DIAGNOSIS — Z87891 Personal history of nicotine dependence: Secondary | ICD-10-CM | POA: Diagnosis not present

## 2021-02-28 LAB — CBC
HCT: 37.8 % (ref 36.0–46.0)
Hemoglobin: 12.4 g/dL (ref 12.0–15.0)
MCH: 32.1 pg (ref 26.0–34.0)
MCHC: 32.8 g/dL (ref 30.0–36.0)
MCV: 97.9 fL (ref 80.0–100.0)
Platelets: 179 10*3/uL (ref 150–400)
RBC: 3.86 MIL/uL — ABNORMAL LOW (ref 3.87–5.11)
RDW: 13.2 % (ref 11.5–15.5)
WBC: 12 10*3/uL — ABNORMAL HIGH (ref 4.0–10.5)
nRBC: 0 % (ref 0.0–0.2)

## 2021-02-28 LAB — BASIC METABOLIC PANEL
Anion gap: 7 (ref 5–15)
BUN: 16 mg/dL (ref 8–23)
CO2: 28 mmol/L (ref 22–32)
Calcium: 8.5 mg/dL — ABNORMAL LOW (ref 8.9–10.3)
Chloride: 106 mmol/L (ref 98–111)
Creatinine, Ser: 0.87 mg/dL (ref 0.44–1.00)
GFR, Estimated: >60 mL/min (ref >60)
Glucose, Bld: 105 mg/dL — ABNORMAL HIGH (ref 70–99)
Potassium: 4.1 mmol/L (ref 3.5–5.1)
Sodium: 141 mmol/L (ref 135–145)

## 2021-02-28 MED ORDER — HYDROCODONE-ACETAMINOPHEN 5-325 MG PO TABS
1.0000 | ORAL_TABLET | Freq: Four times a day (QID) | ORAL | Status: DC | PRN
  Administered 2021-02-28 (×2): 1 via ORAL
  Filled 2021-02-28 (×3): qty 1

## 2021-02-28 MED ORDER — HYDROMORPHONE HCL 1 MG/ML IJ SOLN
1.0000 mg | Freq: Once | INTRAMUSCULAR | Status: AC
  Administered 2021-02-28: 1 mg via INTRAVENOUS
  Filled 2021-02-28: qty 1

## 2021-02-28 NOTE — Progress Notes (Signed)
[facility] PROGRESS NOTE   Beverly Ballard NTI:[id number] DOB: May 28, 1945 DOA: 02/27/2021  PCP: Caren Macadam, MD  Brief History/Interval Summary: 75 y.o. female with medical history significant of PAF, HTN, HLD, prior seizures. 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  Patient was discharged from neurosurgery service on 11/9.  She went home but then had about 4 back-to-back seizure activities.  She was brought back to the emergency department.  Placed on Depakote.  She also is noted to be on zonisamide for previous history of seizure disorder.  Follows with Dr. Delice Lesch.   Reason for Visit: Seizure  Consultants: Neurosurgery  Procedures: None yet    Subjective/Interval History: Patient noted to be confused this morning.  Complains of headache.  No nausea vomiting.     Assessment/Plan:  Seizures in the setting of recent brain surgery for resection of brain tumor It looks like ED physician discussed with neurosurgery overnight.  Patient was placed on Depakote.  She has a known history of seizure disorder and has been on zonisamide previously.  Will await for neurosurgery input.  May need to involve neurology but will defer this to neurosurgery.  Continue seizure precautions. She remains postictal.  Brain mass status post craniotomy Surgery was on 11/4.  Remains on dexamethasone.  Essential hypertension Monitor blood pressures closely.  Paroxysmal atrial fibrillation/flutter Seen by cardiology during previous hospitalization.  Per cardiology notes there was no indication for long-term anticoagulation.  No beta-blocker was recommended either.  An outpatient telemetry monitor was being considered. Continue to monitor on telemetry for now.  Obesity Estimated body mass index is 38.19 kg/m as calculated from the following:   Height as of 02/22/21: 5\' 5"  (1.651 m).   Weight as of 02/22/21: 104.1 kg.   DVT  Prophylaxis: SCDs Code Status: Full code Family Communication: No family at bedside.  Discussed with the patient Disposition Plan: Hopefully home in the next 24 to 48 hours  Status is: Observation  The patient will require care spanning > 2 midnights and should be moved to inpatient because: Remains postictal.      Medications: Scheduled:  busPIRone  10 mg Oral BID   cholecalciferol  1,000 Units Oral q morning   darifenacin  15 mg Oral Daily   dexamethasone  2 mg Oral BID   divalproex  500 mg Oral Q12H   [START ON 03/01/2021] ferrous sulfate  325 mg Oral Once per day on Mon Wed Fri   furosemide  20 mg Oral q morning   levothyroxine  88 mcg Oral QAC breakfast   lisinopril  5 mg Oral q morning   loratadine  10 mg Oral QHS   pantoprazole  80 mg Oral Q1200   pravastatin  20 mg Oral QHS   sertraline  100 mg Oral BID   vitamin B-12  1,000 mcg Oral q morning   zonisamide  200 mg Oral BID   Continuous: QQP:[id number] **OR** acetaminophen, albuterol, HYDROcodone-acetaminophen, iohexol, ondansetron **OR** ondansetron (ZOFRAN) IV  Antibiotics: Anti-infectives (From admission, onward)    None       Objective:  Vital Signs  Vitals:   02/27/21 2317 02/28/21 0330 02/28/21 0737 02/28/21 0737  BP: (!) 132/58 (!) 110/56  (!) 122/57  Pulse: (!) 52 (!) 57  (!) 52  Resp: 16 14  17   Temp: 97.9 F (36.6 C) 98 F (36.7 C) 98.7 F (37.1 C) 98.7 F (37.1 C)  TempSrc: Oral Oral  Oral  SpO2: 100% 95%  97%    Intake/Output Summary (Last 24 hours) at 02/28/2021 1042 Last data filed at 02/28/2021 0550 Gross per 24 hour  Intake 240 ml  Output 500 ml  Net -260 ml   There were no vitals filed for this visit.  General appearance: Awake alert.  Confused this morning Resp: Clear to auscultation bilaterally.  Normal effort Cardio: S1-S2 is normal regular.  No S3-S4.  No rubs murmurs or bruit GI: Abdomen is soft.  Nontender nondistended.  Bowel sounds are present normal.  No masses  organomegaly Extremities: No edema.  Moving all of her extremities Neurologic:  No focal neurological deficits.    Lab Results:  Data Reviewed: I have personally reviewed following labs and imaging studies  CBC: Recent Labs  Lab 02/27/21 1627 02/28/21 0326  WBC 14.4* 12.0*  NEUTROABS 11.9*  --   HGB 13.9 12.4  HCT 43.5 37.8  MCV 99.8 97.9  PLT 203 294    Basic Metabolic Panel: Recent Labs  Lab 02/25/21 0407 02/27/21 1627 02/28/21 0326  NA 141 138 141  K 4.2 3.7 4.1  CL 107 101 106  CO2 30 28 28   GLUCOSE 112* 101* 105*  BUN 14 18 16   CREATININE 0.92 0.93 0.87  CALCIUM 8.8* 8.5* 8.5*  MG 2.1 2.2  --     GFR: Estimated Creatinine Clearance: 68.9 mL/min (by C-G formula based on SCr of 0.87 mg/dL).  Liver Function Tests: Recent Labs  Lab 02/27/21 1627  AST 21  ALT 16  ALKPHOS 73  BILITOT 0.4  PROT 6.8  ALBUMIN 3.4*   Thyroid Function Tests: Recent Labs    02/26/21 0437  TSH 0.819    Recent Results (from the past 240 hour(s))  SARS Coronavirus 2 by RT PCR (hospital order, performed in [facility] hospital lab) Nasopharyngeal Nasopharyngeal Swab     Status: None   Collection Time: 02/22/21  8:08 AM   Specimen: Nasopharyngeal Swab  Result Value Ref Range Status   SARS Coronavirus 2 NEGATIVE NEGATIVE Final    Comment: (NOTE) SARS-CoV-2 target nucleic acids are NOT DETECTED.  The SARS-CoV-2 RNA is generally detectable in upper and lower respiratory specimens during the acute phase of infection. The lowest concentration of SARS-CoV-2 viral copies this assay can detect is 250 copies / mL. A negative result does not preclude SARS-CoV-2 infection and should not be used as the sole basis for treatment or other patient management decisions.  A negative result may occur with improper specimen collection / handling, submission of specimen other than nasopharyngeal swab, presence of viral mutation(s) within the areas targeted by this assay, and inadequate  number of viral copies (<250 copies / mL). A negative result must be combined with clinical observations, patient history, and epidemiological information.  Fact Sheet for Patients:   StrictlyIdeas.no  Fact Sheet for Healthcare Providers: BankingDealers.co.za  This test is not yet approved or  cleared by the [place] FDA and has been authorized for detection and/or diagnosis of SARS-CoV-2 by FDA under an Emergency Use Authorization (EUA).  This EUA will remain in effect (meaning this test can be used) for the duration of the COVID-19 declaration under Section 564(b)(1) of the Act, 21 U.S.C. section 360bbb-3(b)(1), unless the authorization is terminated or revoked sooner.  Performed at [facility], [place] [address] [place], [place] [address]   MRSA Next Gen by PCR, Nasal     Status: None   Collection Time: 02/22/21  5:50 PM  Specimen: Nasal Mucosa; Nasal Swab  Result Value Ref Range Status   MRSA by PCR Next Gen NOT DETECTED NOT DETECTED Final    Comment: (NOTE) The GeneXpert MRSA Assay (FDA approved for NASAL specimens only), is one component of a comprehensive MRSA colonization surveillance program. It is not intended to diagnose MRSA infection nor to guide or monitor treatment for MRSA infections. Test performance is not FDA approved in patients less than 87 years old. Performed at [facility], [place] [address], [place], [place] [address]   Resp Panel by RT-PCR (Flu A&B, Covid) Nasopharyngeal Swab     Status: None   Collection Time: 02/27/21  8:19 PM   Specimen: Nasopharyngeal Swab; Nasopharyngeal(NP) swabs in vial transport medium  Result Value Ref Range Status   SARS Coronavirus 2 by RT PCR NEGATIVE NEGATIVE Final    Comment: (NOTE) SARS-CoV-2 target nucleic acids are NOT DETECTED.  The SARS-CoV-2 RNA is generally detectable in upper respiratory specimens during the acute phase of infection. The  lowest concentration of SARS-CoV-2 viral copies this assay can detect is 138 copies/mL. A negative result does not preclude SARS-Cov-2 infection and should not be used as the sole basis for treatment or other patient management decisions. A negative result may occur with  improper specimen collection/handling, submission of specimen other than nasopharyngeal swab, presence of viral mutation(s) within the areas targeted by this assay, and inadequate number of viral copies(<138 copies/mL). A negative result must be combined with clinical observations, patient history, and epidemiological information. The expected result is Negative.  Fact Sheet for Patients:  EntrepreneurPulse.com.au  Fact Sheet for Healthcare Providers:  IncredibleEmployment.be  This test is no t yet approved or cleared by the [place] FDA and  has been authorized for detection and/or diagnosis of SARS-CoV-2 by FDA under an Emergency Use Authorization (EUA). This EUA will remain  in effect (meaning this test can be used) for the duration of the COVID-19 declaration under Section 564(b)(1) of the Act, 21 U.S.C.section 360bbb-3(b)(1), unless the authorization is terminated  or revoked sooner.       Influenza A by PCR NEGATIVE NEGATIVE Final   Influenza B by PCR NEGATIVE NEGATIVE Final    Comment: (NOTE) The Xpert Xpress SARS-CoV-2/FLU/RSV plus assay is intended as an aid in the diagnosis of influenza from Nasopharyngeal swab specimens and should not be used as a sole basis for treatment. Nasal washings and aspirates are unacceptable for Xpert Xpress SARS-CoV-2/FLU/RSV testing.  Fact Sheet for Patients: EntrepreneurPulse.com.au  Fact Sheet for Healthcare Providers: IncredibleEmployment.be  This test is not yet approved or cleared by the [place] FDA and has been authorized for detection and/or diagnosis of SARS-CoV-2 by FDA under  an Emergency Use Authorization (EUA). This EUA will remain in effect (meaning this test can be used) for the duration of the COVID-19 declaration under Section 564(b)(1) of the Act, 21 U.S.C. section 360bbb-3(b)(1), unless the authorization is terminated or revoked.  Performed at [facility], [place] [address], [place], [place] [address]       Radiology Studies: CT Head Wo Contrast  Result Date: 02/27/2021 CLINICAL DATA:  Seizure.  Abnormal neurological exam. EXAM: CT HEAD WITHOUT CONTRAST TECHNIQUE: Contiguous axial images were obtained from the base of the skull through the vertex without intravenous contrast. COMPARISON:  02/22/2021.  02/23/2021. FINDINGS: Brain: Previous left parieto-occipital craniotomy for tumor debulking and biopsy. Edema, air and a small amount of blood products are present in the region of the surgery as expected.  Regional vasogenic edema remains evident. Without contrast, residual enhancing tumor in the region previously shown cannot be assessed. No unexpected or worsening finding. The remainder the brain appears negative. No hydrocephalus or extra-axial collection. Vascular: No abnormal vascular finding. Skull: Otherwise negative. Sinuses/Orbits: Clear/normal Other: None IMPRESSION: No unexpected finding. Recent left parieto-occipital craniotomy for tumor resection/debulking. Edema, small amount of air and small amount of blood remaining evident at the surgical site as expected. No worsening or unexpected finding. Residual enhancing tumor previously shown cannot be assessed on this noncontrast exam. The remainder of the brain appears negative. Electronically Signed   By: Nelson Chimes M.D.   On: 02/27/2021 17:33   DG Chest Port 1 View  Result Date: 02/27/2021 CLINICAL DATA:  Difficulty breathing, seizures EXAM: PORTABLE CHEST 1 VIEW COMPARISON:  Previous studies including the radiograph done on 02/16/2021 and CT done on 02/18/2021 FINDINGS: Transverse diameter of  heart is increased. Increased interstitial and alveolar densities seen in both lungs, more so on the right side. There is slight improvement in infiltrates in both lungs. There is poor inspiration. Left lateral CP angle is not included. Right lateral CP angle is clear. There is no pneumothorax. IMPRESSION: Cardiomegaly. Increased interstitial markings are seen in both lungs, more so on the right side suggesting scarring. There is slight improvement in aeration of parahilar regions which may be due to slightly better inspiration or suggest decrease in superimposed interstitial pneumonitis. No new focal infiltrates are seen. Electronically Signed   By: Elmer Picker M.D.   On: 02/27/2021 16:51       LOS: 0 days   [facility] Pager on [email or web address]  02/28/2021, 10:42 AM

## 2021-03-01 DIAGNOSIS — R569 Unspecified convulsions: Secondary | ICD-10-CM | POA: Diagnosis not present

## 2021-03-01 DIAGNOSIS — C719 Malignant neoplasm of brain, unspecified: Secondary | ICD-10-CM | POA: Diagnosis not present

## 2021-03-01 LAB — CBC
HCT: 42.5 % (ref 36.0–46.0)
Hemoglobin: 13.4 g/dL (ref 12.0–15.0)
MCH: 31.7 pg (ref 26.0–34.0)
MCHC: 31.5 g/dL (ref 30.0–36.0)
MCV: 100.5 fL — ABNORMAL HIGH (ref 80.0–100.0)
Platelets: 189 10*3/uL (ref 150–400)
RBC: 4.23 MIL/uL (ref 3.87–5.11)
RDW: 13.2 % (ref 11.5–15.5)
WBC: 12.6 10*3/uL — ABNORMAL HIGH (ref 4.0–10.5)
nRBC: 0 % (ref 0.0–0.2)

## 2021-03-01 LAB — BASIC METABOLIC PANEL
Anion gap: 8 (ref 5–15)
BUN: 16 mg/dL (ref 8–23)
CO2: 27 mmol/L (ref 22–32)
Calcium: 8.5 mg/dL — ABNORMAL LOW (ref 8.9–10.3)
Chloride: 105 mmol/L (ref 98–111)
Creatinine, Ser: 1.04 mg/dL — ABNORMAL HIGH (ref 0.44–1.00)
GFR, Estimated: 57 mL/min — ABNORMAL LOW (ref >60)
Glucose, Bld: 118 mg/dL — ABNORMAL HIGH (ref 70–99)
Potassium: 4.2 mmol/L (ref 3.5–5.1)
Sodium: 140 mmol/L (ref 135–145)

## 2021-03-01 MED ORDER — LEVETIRACETAM 500 MG PO TABS
1000.0000 mg | ORAL_TABLET | ORAL | Status: AC
  Administered 2021-03-01: 1000 mg via ORAL
  Filled 2021-03-01: qty 2

## 2021-03-01 MED ORDER — LEVETIRACETAM 500 MG PO TABS: 500.0000 mg | ORAL_TABLET | Freq: Two times a day (BID) | ORAL | Status: DC

## 2021-03-01 MED ORDER — LEVETIRACETAM 500 MG PO TABS: 500.0000 mg | ORAL_TABLET | Freq: Two times a day (BID) | ORAL | 2 refills | Status: DC

## 2021-03-01 NOTE — Consult Note (Signed)
Neurology Consultation Reason for Consult: seizure Referring Physician: Dr Bonnielee Haff  CC: seizure  History is obtained from: Patient, chart review  HPI: Beverly Ballard is a 75 y.o. right-handed female with history of hypertension, hyperlipidemia, hypothyroidism, migraines, vaginal cancer, primary generalized epilepsy who was admitted with breakthrough seizures  Per review of Dr. Amparo Bristol note patient started having seizures in childhood which stopped for a few years until she was a teenager.  She then started having staring episodes briefly, eventually resolved.  She did not have any further staring spells since 1988.  She did have a seizure in 2014 when she was completely weaned off of the medication described as smelling a funny odor.  She was then started on zonisamide 200 mg twice daily and remained seizure-free until 02/16/2021.  On 02/17/2020, patient was seen by Dr. Lorrin Goodell.  Per review of his note patient's husband noted a seizure of unknown duration and called EMS.  In route, patient had another 3-minute seizure and was given IM Versed 5 mg.  She then had another 5-minute long seizure and was loaded with Keppra.  MRI brain with and without contrast was performed which showed 2.0 x 2.2 x 1.6 cm heterogeneously enhancing mass lesion in the left parietal lobe with surrounding edema and local mass effect.  Neurosurgery was consulted and patient underwent left posterior parietal craniotomy for tumor resection.  Routine EEG was performed which was consistent with patient's known history of generalized epilepsy. Additionally single spike was noted in left centro-parietal region which could be part of the generalized epilepsy. However due to underlying left parietal lesion, this could also be due to independent epilepsy arising from  left centro- parietal region.  There is also evidence of cortical dysfunction in left hemisphere, maximal left parietal region likely secondary to underlying  structural abnormality.  No seizures were seen during the study.  Patient was discharged on home zonisamide dose of 200 mg twice daily.   She presented again on 02/27/2021 after 4 witnessed generalized tonic-clonic seizures with EMS.  Her husband, patient was sitting in recliner and he heard her making a sound.  When he went to see her she had neck jerking, cannot remember which side) foaming of the mouth and not responding to questions.  He called EMS and [facility] was brought [facility]. Neurology was consulted for further recommendations regarding antiepileptic drugs.  Prior AEDs: Levetiracetam, lamotrigine, carbamazepine (hyponatremia), lacosamide  MRI brain with and without contrast 02/24/2019:  ROS: All other systems reviewed and negative except as noted in the HPI.   Past Medical History:  Diagnosis Date   Allergy    Anemia    Anxiety and depression 12/19/2009   Arthritis    Cancer ([place])    vaginal   Degenerative disorder of eye    Diverticulosis    Dysrhythmia 01/2021   Atrial fib/flutter   Essential hypertension, benign 06/26/2009   GERD 05/27/2007   takes Nexium daily   History of gout    History of migraine many yrs ago   Hyperlipidemia    takes Pravastatin daily   Hypothyroidism    Insomnia    but doesn't take any meds   LOW BACK PAIN 05/27/2007   Osteoporosis    Seizures ([place])    last 1983, none since then- last seizure 1988 per pt    Sleep apnea    wears CPAP    Family History  Problem Relation Age of Onset   Aortic dissection Father  Arthritis Mother    Stroke Mother 90   Heart murmur Other    Throat cancer Sister    Colon cancer Neg Hx    Esophageal cancer Neg Hx    Stomach cancer Neg Hx    Rectal cancer Neg Hx    Colon polyps Neg Hx     Social History:  reports that she quit smoking about 42 years ago. Her smoking use included cigarettes. She has a 5.00 pack-year smoking history. She has never used smokeless tobacco. She reports that she  does not drink alcohol and does not use drugs.   Medications Prior to Admission  Medication Sig Dispense Refill Last Dose   albuterol (VENTOLIN HFA) 108 (90 Base) MCG/ACT inhaler Inhale 1-2 puffs into the lungs every 6 (six) hours as needed for wheezing or shortness of breath. 8 g 0 Past Month   busPIRone (BUSPAR) 10 MG tablet TAKE 1 TABLET BY MOUTH TWICE A DAY (Patient taking differently: Take 10 mg by mouth 2 (two) times daily.) 180 tablet 0 Past Week   cholecalciferol (VITAMIN D3) 25 MCG (1000 UNIT) tablet Take 1,000 Units by mouth every morning.   Past Week   EPINEPHrine 0.3 mg/0.3 mL IJ SOAJ injection Inject 0.3 mg into the muscle once as needed for anaphylaxis.   unk   esomeprazole (NEXIUM) 20 MG capsule Take 2 capsules (40 mg total) by mouth daily before breakfast. (Patient taking differently: Take 20 mg by mouth daily before breakfast.) 2 capsule 0 Past Week   ferrous sulfate 325 (65 FE) MG tablet Take 325 mg by mouth 3 (three) times a week.   Past Week   furosemide (LASIX) 20 MG tablet TAKE 1 TABLET BY MOUTH IN THE MORNING 30 tablet 1 Past Week   HYDROcodone-acetaminophen (NORCO/VICODIN) 5-325 MG tablet Take 1 tablet by mouth every 8 (eight) hours as needed for moderate pain. 20 tablet 0 Past Week   levocetirizine (XYZAL) 5 MG tablet Take 5 mg by mouth at bedtime.   Past Week   levothyroxine (SYNTHROID) 88 MCG tablet Take 1 tablet (88 mcg total) by mouth daily. (Patient taking differently: Take 88 mcg by mouth daily before breakfast.) 90 tablet 3 Past Week   lisinopril (ZESTRIL) 5 MG tablet TAKE 1 TABLET BY MOUTH EVERY DAY (Patient taking differently: Take 5 mg by mouth every morning.) 90 tablet 1 Past Week   pravastatin (PRAVACHOL) 20 MG tablet TAKE 1 TABLET (20 MG TOTAL) BY MOUTH DAILY. (Patient taking differently: Take 20 mg by mouth at bedtime.) 90 tablet 1 Past Week   sertraline (ZOLOFT) 100 MG tablet Take 2 tablets (200 mg total) by mouth daily. (Patient taking differently: Take 100  mg by mouth 2 (two) times daily.) 180 tablet 1 Past Week   solifenacin (VESICARE) 10 MG tablet Take 10 mg by mouth at bedtime.   Past Week   vitamin B-12 (CYANOCOBALAMIN) 1000 MCG tablet Take 1,000 mcg by mouth every morning.   Past Week   zonisamide (ZONEGRAN) 100 MG capsule Take 2 capsules twice a day (Patient taking differently: Take 200 mg by mouth 2 (two) times daily. Seizure medication) 360 capsule 3 Past Week   Budeson-Glycopyrrol-Formoterol (BREZTRI AEROSPHERE) 160-9-4.8 MCG/ACT AERO Inhale 2 puffs into the lungs 2 (two) times daily. (Patient not taking: No sig reported) 5.9 g 0 Not Taking   dexamethasone (DECADRON) 4 MG tablet Take 0.5 tablets (2 mg total) by mouth 2 (two) times daily. (Patient not taking: No sig reported) 30 tablet 0 Completed Course  Spacer/Aero-Holding Chambers (AEROCHAMBER PLUS) inhaler Use as instructed 1 each 0       Exam: Current vital signs: BP 105/89 (BP Location: Right Arm)   Pulse (!) 56   Temp 97.6 F (36.4 C) (Oral)   Resp 17   SpO2 100%  Vital signs in last 24 hours: Temp:  [97.5 F (36.4 C)-98.7 F (37.1 C)] 97.6 F (36.4 C) (11/11 0734) Pulse Rate:  [56-109] 56 (11/11 0734) Resp:  [17-18] 17 (11/11 0734) BP: (105-129)/(44-98) 105/89 (11/11 0734) SpO2:  [93 %-100 %] 100 % (11/11 0448)   Physical Exam  Constitutional: Appears well-developed and well-nourished.  Psych: Affect appropriate to situation Eyes: No scleral injection HENT: No OP obstrucion Head: Normocephalic.  Cardiovascular: Normal rate and regular rhythm.  Respiratory: Effort normal, non-labored breathing GI: Soft.  No distension. There is no tenderness.  Skin: Warm, no rash Neuro: AOx3, cranial nerves II to XII appear grossly intact, 5/5 in all extremities   I have reviewed labs in epic and the results pertinent to this consultation are: CBC:  Recent Labs  Lab 02/27/21 1627 02/28/21 0326 03/01/21 0251  WBC 14.4* 12.0* 12.6*  NEUTROABS 11.9*  --   --   HGB 13.9  12.4 13.4  HCT 43.5 37.8 42.5  MCV 99.8 97.9 100.5*  PLT 203 179 353    Basic Metabolic Panel:  Lab Results  Component Value Date   NA 140 03/01/2021   K 4.2 03/01/2021   CO2 27 03/01/2021   GLUCOSE 118 (H) 03/01/2021   BUN 16 03/01/2021   CREATININE 1.04 (H) 03/01/2021   CALCIUM 8.5 (L) 03/01/2021   GFRNONAA 57 (L) 03/01/2021   GFRAA >60 12/20/2019   Lipid Panel:  Lab Results  Component Value Date   LDLCALC 100 (H) 03/19/2020   HgbA1c:  Lab Results  Component Value Date   HGBA1C 5.7 06/24/2019   Urine Drug Screen: No results found for: LABOPIA, COCAINSCRNUR, LABBENZ, AMPHETMU, THCU, LABBARB  Alcohol Level No results found for: ETH   I have reviewed the images obtained:  MRI brain with and without contrast 02/23/2021: Postoperative changes from interval left parietal tumor resection. Evidence of some residual enhancing and nonenhancing tumor lateral to the resection cavity. Mildly decreased edema.  CT head without contrast 02/27/2021: No unexpected finding. Recent left parieto-occipital craniotomy for tumor resection/debulking. Edema, small amount of air and small amount of blood remaining evident at the surgical site as expected. No worsening or unexpected finding. Residual enhancing tumor previously shown cannot be assessed on this noncontrast exam. The remainder of the brain appears negative.  ASSESSMENT/PLAN: 75 year old female with history of primary generalized epilepsy, seizure-free on zonisamide 200 mg twice daily now with new right parietal lesion status postresection and breakthrough seizures.   Right parietal lesion status postresection Epilepsy with breakthrough seizure -Patient had primary generalized epilepsy which was well controlled on zonisamide 200 mg twice daily.  However she now has a new right parietal region which is since been resected.  Routine EEG on 02/18/2021 showed single spike in left centro-parietal region which could be part of the generalized  epilepsy. However due to underlying left parietal lesion, this could also be due to independent epilepsy arising from  left centro- parietal region.    Recommendations: -Patient likely needs to be on another antiepileptic drug because she has had multiple breakthrough seizures while on her current regimen -She was started on Depakote and has been tolerating it.  However, if she needs chemotherapy in the future there might  be significant interactions.  Therefore I discussed starting Keppra.  Patient has been on Keppra sometime in the past and per EMR felt dizzy on it.  However this was at least before 2014.  After discussing side effects and potential interactions, patient is willing to try Keppra again.  -We will give one-time dose of Keppra 1000 mg and start on 500 mg twice daily -Discussed seizure precautions including do not drive with patient  -Follow-up with Dr. Mickeal Skinner ( neuroonc) on 03/11/2021 @10am   Seizure precautions: Per [facility] statutes, patients with seizures are not allowed to drive until they have been seizure-free for six months and cleared by a physician    Use caution when using heavy equipment or power tools. Avoid working on ladders or at heights. Take showers instead of baths. Ensure the water temperature is not too high on the home water heater. Do not go swimming alone. Do not lock yourself in a room alone (i.e. bathroom). When caring for infants or small children, sit down when holding, feeding, or changing them to minimize risk of injury to the child in the event you have a seizure. Maintain good sleep hygiene. Avoid alcohol.    If patient has another seizure, call 911 and bring them back to the ED if: A.  The seizure lasts longer than 5 minutes.      B.  The patient doesn't wake shortly after the seizure or has new problems such as difficulty seeing, speaking or moving following the seizure C.  The patient was injured during the seizure D.  The patient has a  temperature over 102 F (39C) E.  The patient vomited during the seizure and now is having trouble breathing    During the Seizure   - First, ensure adequate ventilation and place patients on the floor on their left side  Loosen clothing around the neck and ensure the airway is patent. If the patient is clenching the teeth, do not force the mouth open with any object as this can cause severe damage - Remove all items from the surrounding that can be hazardous. The patient may be oblivious to what's happening and may not even know what he or she is doing. If the patient is confused and wandering, either gently guide him/her away and block access to outside areas - Reassure the individual and be comforting - Call 911. In most cases, the seizure ends before EMS arrives. However, there are cases when seizures may last over 3 to 5 minutes. Or the individual may have developed breathing difficulties or severe injuries. If a pregnant patient or a person with diabetes develops a seizure, it is prudent to call an ambulance. - Finally, if the patient does not regain full consciousness, then call EMS. Most patients will remain confused for about 45 to 90 minutes after a seizure, so you must use judgment in calling for help. - Avoid restraints but make sure the patient is in a bed with padded side rails - Place the individual in a lateral position with the neck slightly flexed; this will help the saliva drain from the mouth and prevent the tongue from falling backward - Remove all nearby furniture and other hazards from the area - Provide verbal assurance as the individual is regaining consciousness - Provide the patient with privacy if possible - Call for help and start treatment as ordered by the caregiver    After the Seizure (Postictal Stage)   After a seizure, most patients experience confusion, fatigue,  muscle pain and/or a headache. Thus, one should permit the individual to sleep. For the next few  days, reassurance is essential. Being calm and helping reorient the person is also of importance.   Most seizures are painless and end spontaneously. Seizures are not harmful to others but can lead to complications such as stress on the lungs, brain and the heart. Individuals with prior lung problems may develop labored breathing and respiratory distress.   Thank you for allowing [place] to participate in the care of this patient. If you have any further questions, please contact  me or neurohospitalist.    I have spent a total of 85 minutes with the patient reviewing hospital notes,  test results, labs and examining the patient as well as establishing an assessment and plan that was discussed personally with the patient.  > 50% of time was spent in direct patient care.       Zeb Comfort Epilepsy [facility] neurohospitalist

## 2021-03-01 NOTE — Plan of Care (Signed)
  Problem: Education: Goal: Knowledge of General Education information will improve Description: Including pain rating scale, medication(s)/side effects and non-pharmacologic comfort measures Outcome: Progressing   Problem: Clinical Measurements: Goal: Ability to maintain clinical measurements within normal limits will improve Outcome: Progressing   Problem: Activity: Goal: Risk for activity intolerance will decrease Outcome: Progressing   Problem: Nutrition: Goal: Adequate nutrition will be maintained Outcome: Progressing   Problem: Coping: Goal: Level of anxiety will decrease Outcome: Progressing   Problem: Education: Goal: Expressions of having a comfortable level of knowledge regarding the disease process will increase Outcome: Progressing   Problem: Coping: Goal: Ability to adjust to condition or change in health will improve Outcome: Progressing   Problem: Health Behavior/Discharge Planning: Goal: Compliance with prescribed medication regimen will improve Outcome: Progressing   Problem: Medication: Goal: Risk for medication side effects will decrease Outcome: Progressing   Problem: Clinical Measurements: Goal: Complications related to the disease process, condition or treatment will be avoided or minimized Outcome: Progressing

## 2021-03-01 NOTE — Progress Notes (Signed)
Pt wheeled off unit by NT all belongings in a bag in her lap.

## 2021-03-01 NOTE — TOC Progression Note (Signed)
Transition of Care [facility]) - Progression Note    Patient Details  Name: Beverly Ballard MRN: [id number] Date of Birth: [birth year]  Transition of [facility]) CM/SW [place], RN Phone [phone]  03/01/2021, 11:33 AM  Clinical Narrative:    Patient received [facility]  recommendations on d/c 11/9. [facility] set up with [facility]. Patient was not seen prior to being readmitted to hospital. New orders have been entered to ensure patient will receive [facility] upon d\c. Message has been sent to MD. No other needs noted at this time. TOC will sign off.         Expected Discharge Plan and Services           Expected Discharge Date: 03/01/21                                     Social Determinants of Health (SDOH) Interventions    Readmission Risk Interventions No flowsheet data found.

## 2021-03-01 NOTE — Discharge Summary (Signed)
[facility]  Physician Discharge Summary   Patient ID: Beverly Ballard MRN: [id number] DOB/AGE: 1945/11/30 75 y.o.  Admit date: 02/27/2021 Discharge date: 03/01/2021    PCP: Caren Macadam, MD  DISCHARGE DIAGNOSES:  Seizures in the setting of recent brain surgery Known history of seizure disorder Brain mass status postcraniotomy Essential hypertension Paroxysmal atrial fibrillation/flutter  RECOMMENDATIONS FOR OUTPATIENT FOLLOW UP: Outpatient follow-up with neuro-oncology and neurosurgery   Home Health: Home health PT and OT Equipment/Devices: None  CODE STATUS: Full code  DISCHARGE CONDITION: fair  Diet recommendation: As before  INITIAL HISTORY: 75 y.o. female with medical history significant of PAF, HTN, HLD, prior seizures. 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  Patient was discharged from neurosurgery service on 11/9.  She went home but then had about 4 back-to-back seizure activities.  She was brought back to the emergency department.  Placed on Depakote.  She also is noted to be on zonisamide for previous history of seizure disorder.  Follows with Dr. Delice Lesch.   Consultations: Neurology Neurosurgery  Procedures: None   HOSPITAL COURSE:   Seizures in the setting of recent brain surgery for resection of brain tumor Patient was started on Depakote after discussions with neurology by EDP.  Admitted to the floor.  She was postictal.  Seen by neurology and changed over from Depakote to [place].  Zonisamide to be continued.  Has remained seizure-free.  Has ambulated.  Home health has been ordered.  Okay for discharge today.  Also discussed with Dr. Ronnald Ramp with neurosurgery.    Brain mass status post craniotomy Surgery was on 11/4.  Pathology has been pending.  Essential hypertension   Paroxysmal atrial fibrillation/flutter Seen by cardiology during previous hospitalization.  Per cardiology notes  there was no indication for long-term anticoagulation.  No beta-blocker was recommended either.  An outpatient telemetry monitor was being considered.    Obesity Estimated body mass index is 38.19 kg/m as calculated from the following:   Height as of 02/22/21: 5\' 5"  (1.651 m).   Weight as of 02/22/21: 104.1 kg.     Patient is stable.  Okay for discharge home today.  Discussed with patient as well as her husband.   PERTINENT LABS:  The results of significant diagnostics from this hospitalization (including imaging, microbiology, ancillary and laboratory) are listed below for reference.    Microbiology: Recent Results (from the past 240 hour(s))  SARS Coronavirus 2 by RT PCR (hospital order, performed in [facility] hospital lab) Nasopharyngeal Nasopharyngeal Swab     Status: None   Collection Time: 02/22/21  8:08 AM   Specimen: Nasopharyngeal Swab  Result Value Ref Range Status   SARS Coronavirus 2 NEGATIVE NEGATIVE Final    Comment: (NOTE) SARS-CoV-2 target nucleic acids are NOT DETECTED.  The SARS-CoV-2 RNA is generally detectable in upper and lower respiratory specimens during the acute phase of infection. The lowest concentration of SARS-CoV-2 viral copies this assay can detect is 250 copies / mL. A negative result does not preclude SARS-CoV-2 infection and should not be used as the sole basis for treatment or other patient management decisions.  A negative result may occur with improper specimen collection / handling, submission of specimen other than nasopharyngeal swab, presence of viral mutation(s) within the areas targeted by this assay, and inadequate number of viral copies (<250 copies / mL). A negative result must be combined with clinical observations, patient history, and epidemiological information.  Fact Sheet  for Patients:   StrictlyIdeas.no  Fact Sheet for Healthcare Providers: BankingDealers.co.za  This test is  not yet approved or  cleared by the [place] FDA and has been authorized for detection and/or diagnosis of SARS-CoV-2 by FDA under an Emergency Use Authorization (EUA).  This EUA will remain in effect (meaning this test can be used) for the duration of the COVID-19 declaration under Section 564(b)(1) of the Act, 21 U.S.C. section 360bbb-3(b)(1), unless the authorization is terminated or revoked sooner.  Performed at [facility], [place] [address] [place], [place] [address]   MRSA Next Gen by PCR, Nasal     Status: None   Collection Time: 02/22/21  5:50 PM   Specimen: Nasal Mucosa; Nasal Swab  Result Value Ref Range Status   MRSA by PCR Next Gen NOT DETECTED NOT DETECTED Final    Comment: (NOTE) The GeneXpert MRSA Assay (FDA approved for NASAL specimens only), is one component of a comprehensive MRSA colonization surveillance program. It is not intended to diagnose MRSA infection nor to guide or monitor treatment for MRSA infections. Test performance is not FDA approved in patients less than 61 years old. Performed at [facility], [place] [address] [place], [place] [address]   Resp Panel by RT-PCR (Flu A&B, Covid) Nasopharyngeal Swab     Status: None   Collection Time: 02/27/21  8:19 PM   Specimen: Nasopharyngeal Swab; Nasopharyngeal(NP) swabs in vial transport medium  Result Value Ref Range Status   SARS Coronavirus 2 by RT PCR NEGATIVE NEGATIVE Final    Comment: (NOTE) SARS-CoV-2 target nucleic acids are NOT DETECTED.  The SARS-CoV-2 RNA is generally detectable in upper respiratory specimens during the acute phase of infection. The lowest concentration of SARS-CoV-2 viral copies this assay can detect is 138 copies/mL. A negative result does not preclude SARS-Cov-2 infection and should not be used as the sole basis for treatment or other patient management decisions. A negative result may occur with  improper specimen collection/handling, submission of specimen  other than nasopharyngeal swab, presence of viral mutation(s) within the areas targeted by this assay, and inadequate number of viral copies(<138 copies/mL). A negative result must be combined with clinical observations, patient history, and epidemiological information. The expected result is Negative.  Fact Sheet for Patients:  EntrepreneurPulse.com.au  Fact Sheet for Healthcare Providers:  IncredibleEmployment.be  This test is no t yet approved or cleared by the [place] FDA and  has been authorized for detection and/or diagnosis of SARS-CoV-2 by FDA under an Emergency Use Authorization (EUA). This EUA will remain  in effect (meaning this test can be used) for the duration of the COVID-19 declaration under Section 564(b)(1) of the Act, 21 U.S.C.section 360bbb-3(b)(1), unless the authorization is terminated  or revoked sooner.       Influenza A by PCR NEGATIVE NEGATIVE Final   Influenza B by PCR NEGATIVE NEGATIVE Final    Comment: (NOTE) The Xpert Xpress SARS-CoV-2/FLU/RSV plus assay is intended as an aid in the diagnosis of influenza from Nasopharyngeal swab specimens and should not be used as a sole basis for treatment. Nasal washings and aspirates are unacceptable for Xpert Xpress SARS-CoV-2/FLU/RSV testing.  Fact Sheet for Patients: EntrepreneurPulse.com.au  Fact Sheet for Healthcare Providers: IncredibleEmployment.be  This test is not yet approved or cleared by the [place] FDA and has been authorized for detection and/or diagnosis of SARS-CoV-2 by FDA under an Emergency Use Authorization (EUA). This EUA will remain in effect (meaning this test can be  used) for the duration of the COVID-19 declaration under Section 564(b)(1) of the Act, 21 U.S.C. section 360bbb-3(b)(1), unless the authorization is terminated or revoked.  Performed at [facility], [place] [address], [place],  [place] [address]      Labs:  COVID-19 Labs   Lab Results  Component Value Date   SARSCOV2NAA NEGATIVE 02/27/2021   [place] NEGATIVE 02/22/2021   [place] NEGATIVE 02/16/2021   [place] NEGATIVE 12/20/2019      Basic Metabolic Panel: Recent Labs  Lab 02/25/21 0407 02/27/21 1627 02/28/21 0326 03/01/21 0251  NA 141 138 141 140  K 4.2 3.7 4.1 4.2  CL 107 101 106 105  CO2 30 28 28 27   GLUCOSE 112* 101* 105* 118*  BUN 14 18 16 16   CREATININE 0.92 0.93 0.87 1.04*  CALCIUM 8.8* 8.5* 8.5* 8.5*  MG 2.1 2.2  --   --    Liver Function Tests: Recent Labs  Lab 02/27/21 1627  AST 21  ALT 16  ALKPHOS 73  BILITOT 0.4  PROT 6.8  ALBUMIN 3.4*    CBC: Recent Labs  Lab 02/27/21 1627 02/28/21 0326 03/01/21 0251  WBC 14.4* 12.0* 12.6*  NEUTROABS 11.9*  --   --   HGB 13.9 12.4 13.4  HCT 43.5 37.8 42.5  MCV 99.8 97.9 100.5*  PLT 203 179 189      IMAGING STUDIES CT Head Wo Contrast  Result Date: 02/27/2021 CLINICAL DATA:  Seizure.  Abnormal neurological exam. EXAM: CT HEAD WITHOUT CONTRAST TECHNIQUE: Contiguous axial images were obtained from the base of the skull through the vertex without intravenous contrast. COMPARISON:  02/22/2021.  02/23/2021. FINDINGS: Brain: Previous left parieto-occipital craniotomy for tumor debulking and biopsy. Edema, air and a small amount of blood products are present in the region of the surgery as expected. Regional vasogenic edema remains evident. Without contrast, residual enhancing tumor in the region previously shown cannot be assessed. No unexpected or worsening finding. The remainder the brain appears negative. No hydrocephalus or extra-axial collection. Vascular: No abnormal vascular finding. Skull: Otherwise negative. Sinuses/Orbits: Clear/normal Other: None IMPRESSION: No unexpected finding. Recent left parieto-occipital craniotomy for tumor resection/debulking. Edema, small amount of air and small amount of blood remaining evident  at the surgical site as expected. No worsening or unexpected finding. Residual enhancing tumor previously shown cannot be assessed on this noncontrast exam. The remainder of the brain appears negative. Electronically Signed   By: Nelson Chimes M.D.   On: 02/27/2021 17:33    DG Chest Port 1 View  Result Date: 02/27/2021 CLINICAL DATA:  Difficulty breathing, seizures EXAM: PORTABLE CHEST 1 VIEW COMPARISON:  Previous studies including the radiograph done on 02/16/2021 and CT done on 02/18/2021 FINDINGS: Transverse diameter of heart is increased. Increased interstitial and alveolar densities seen in both lungs, more so on the right side. There is slight improvement in infiltrates in both lungs. There is poor inspiration. Left lateral CP angle is not included. Right lateral CP angle is clear. There is no pneumothorax. IMPRESSION: Cardiomegaly. Increased interstitial markings are seen in both lungs, more so on the right side suggesting scarring. There is slight improvement in aeration of parahilar regions which may be due to slightly better inspiration or suggest decrease in superimposed interstitial pneumonitis. No new focal infiltrates are seen. Electronically Signed   By: Elmer Picker M.D.   On: 02/27/2021 16:51    DISCHARGE EXAMINATION: Vitals:   03/01/21 0003 03/01/21 0448 03/01/21 0734 03/01/21 1124  BP: (!) 114/56 Marland Kitchen)  129/51 105/89 (!) 112/45  Pulse: (!) 59 (!) 58 (!) 56 (!) 57  Resp: 18 18 17 16   Temp: 97.9 F (36.6 C) (!) 97.5 F (36.4 C) 97.6 F (36.4 C) 97.8 F (36.6 C)  TempSrc: Oral Oral Oral Oral  SpO2: 97% 100%  100%   General appearance: Awake alert.  In no distress Resp: Clear to auscultation bilaterally.  Normal effort Cardio: S1-S2 is normal regular.  No S3-S4.  No rubs murmurs or bruit GI: Abdomen is soft.  Nontender nondistended.  Bowel sounds are present normal.  No masses organomegaly    DISPOSITION: Home with husband  Discharge Instructions     Call MD for:   difficulty breathing, headache or visual disturbances   Complete by: As directed    Call MD for:  extreme fatigue   Complete by: As directed    Call MD for:  persistant dizziness or light-headedness   Complete by: As directed    Call MD for:  persistant nausea and vomiting   Complete by: As directed    Call MD for:  severe uncontrolled pain   Complete by: As directed    Call MD for:  temperature >100.4   Complete by: As directed    Diet - low sodium heart healthy   Complete by: As directed    Discharge instructions   Complete by: As directed    Please keep your upcoming appointments for further management of your brain tumor.  You were cared for by a hospitalist during your hospital stay. If you have any questions about your discharge medications or the care you received while you were in the hospital after you are discharged, you can call the unit and asked to speak with the hospitalist on call if the hospitalist that took care of you is not available. Once you are discharged, your primary care physician will handle any further medical issues. Please note that NO REFILLS for any discharge medications will be authorized once you are discharged, as it is imperative that you return to your primary care physician (or establish a relationship with a primary care physician if you do not have one) for your aftercare needs so that they can reassess your need for medications and monitor your lab values. If you do not have a primary care physician, you can call [phone] for a physician referral.   Discharge wound care:   Complete by: As directed    Continue care of the incision site as instructed by your surgeon.   Increase activity slowly   Complete by: As directed         Allergies as of 03/01/2021       Reactions   Lamotrigine Swelling   Tongue swelling   Bupropion Other (See Comments)   Unknown reaction   Carbamazepine Other (See Comments)   Hypnatremia   Levetiracetam Other (See  Comments)   dizziness   Tramadol Other (See Comments)   Unknown reaction   Adhesive [tape] Other (See Comments)   ?  Blister after knee surgery   Amitriptyline Nausea And Vomiting   Clarithromycin Other (See Comments)   Unknown reaction   Doxycycline Other (See Comments)   Interfered with seizure medication   Nickel Other (See Comments)   Had to remove knee implant with nickel and replace it   Other Other (See Comments)   Allergic to Metal and perfumes (unknown reaction)   Topamax [topiramate] Nausea And Vomiting   Estradiol Rash   Latex Rash  Phenytoin Sodium Extended Other (See Comments)   drowsiness        Medication List     TAKE these medications    AeroChamber Plus inhaler Use as instructed   albuterol 108 (90 Base) MCG/ACT inhaler Commonly known as: VENTOLIN HFA Inhale 1-2 puffs into the lungs every 6 (six) hours as needed for wheezing or shortness of breath.   Breztri Aerosphere 160-9-4.8 MCG/ACT Aero Generic drug: Budeson-Glycopyrrol-Formoterol Inhale 2 puffs into the lungs 2 (two) times daily.   busPIRone 10 MG tablet Commonly known as: BUSPAR TAKE 1 TABLET BY MOUTH TWICE A DAY   cholecalciferol 25 MCG (1000 UNIT) tablet Commonly known as: VITAMIN D3 Take 1,000 Units by mouth every morning.   dexamethasone 4 MG tablet Commonly known as: DECADRON Take 0.5 tablets (2 mg total) by mouth 2 (two) times daily.   EPINEPHrine 0.3 mg/0.3 mL Soaj injection Commonly known as: EPI-PEN Inject 0.3 mg into the muscle once as needed for anaphylaxis.   esomeprazole 20 MG capsule Commonly known as: NEXIUM Take 2 capsules (40 mg total) by mouth daily before breakfast. What changed: how much to take   ferrous sulfate 325 (65 FE) MG tablet Take 325 mg by mouth 3 (three) times a week.   furosemide 20 MG tablet Commonly known as: LASIX TAKE 1 TABLET BY MOUTH IN THE MORNING   HYDROcodone-acetaminophen 5-325 MG tablet Commonly known as: NORCO/VICODIN Take 1  tablet by mouth every 8 (eight) hours as needed for moderate pain.   levETIRAcetam 500 MG tablet Commonly known as: KEPPRA Take 1 tablet (500 mg total) by mouth 2 (two) times daily.   levocetirizine 5 MG tablet Commonly known as: XYZAL Take 5 mg by mouth at bedtime.   levothyroxine 88 MCG tablet Commonly known as: SYNTHROID Take 1 tablet (88 mcg total) by mouth daily. What changed: when to take this   lisinopril 5 MG tablet Commonly known as: ZESTRIL TAKE 1 TABLET BY MOUTH EVERY DAY What changed: when to take this   pravastatin 20 MG tablet Commonly known as: PRAVACHOL TAKE 1 TABLET (20 MG TOTAL) BY MOUTH DAILY. What changed: when to take this   sertraline 100 MG tablet Commonly known as: ZOLOFT Take 2 tablets (200 mg total) by mouth daily. What changed:  how much to take when to take this   solifenacin 10 MG tablet Commonly known as: VESICARE Take 10 mg by mouth at bedtime.   vitamin B-12 1000 MCG tablet Commonly known as: CYANOCOBALAMIN Take 1,000 mcg by mouth every morning.   zonisamide 100 MG capsule Commonly known as: ZONEGRAN Take 2 capsules twice a day What changed:  how much to take how to take this when to take this additional instructions               Discharge Care Instructions  (From admission, onward)           Start     Ordered   03/01/21 0000  Discharge wound care:       Comments: Continue care of the incision site as instructed by your surgeon.   03/01/21 1101               TOTAL DISCHARGE TIME: 35 minutes  Lindia Garms [employer] on [email or web address]  03/02/2021, 11:16 AM

## 2021-03-01 NOTE — Consult Note (Signed)
   [facility] Inpatient Consult   03/01/2021  JASMYN PICHA 1945-08-16 [id number]  [facility] [ACO] Patient:  Primary Care Provider:  Caren Macadam, MD, Embedded provider, [place] [facility]  Patient screened for readmission  hospitalization for unplanned readmission to assess for potential [facility] service needs for post hospital transition.  Review of patient's medical record reveals patient is transitioned home prior to follow up.   Plan:  Patient is assigned to an Embedded Pharmacist recently for CCM program noted at [employer] noted.   For questions contact:   Natividad Brood, RN BSN [facility] Liaison  [phone] business mobile phone Toll free office [phone]  Fax number: [phone] [email or web address]

## 2021-03-03 DIAGNOSIS — R69 Illness, unspecified: Secondary | ICD-10-CM | POA: Diagnosis not present

## 2021-03-03 DIAGNOSIS — M810 Age-related osteoporosis without current pathological fracture: Secondary | ICD-10-CM | POA: Diagnosis not present

## 2021-03-03 DIAGNOSIS — J479 Bronchiectasis, uncomplicated: Secondary | ICD-10-CM | POA: Diagnosis not present

## 2021-03-03 DIAGNOSIS — I480 Paroxysmal atrial fibrillation: Secondary | ICD-10-CM | POA: Diagnosis not present

## 2021-03-03 DIAGNOSIS — I119 Hypertensive heart disease without heart failure: Secondary | ICD-10-CM | POA: Diagnosis not present

## 2021-03-03 DIAGNOSIS — Z483 Aftercare following surgery for neoplasm: Secondary | ICD-10-CM | POA: Diagnosis not present

## 2021-03-03 DIAGNOSIS — D496 Neoplasm of unspecified behavior of brain: Secondary | ICD-10-CM | POA: Diagnosis not present

## 2021-03-03 DIAGNOSIS — G40409 Other generalized epilepsy and epileptic syndromes, not intractable, without status epilepticus: Secondary | ICD-10-CM | POA: Diagnosis not present

## 2021-03-03 DIAGNOSIS — G9389 Other specified disorders of brain: Secondary | ICD-10-CM | POA: Diagnosis not present

## 2021-03-03 DIAGNOSIS — I4892 Unspecified atrial flutter: Secondary | ICD-10-CM | POA: Diagnosis not present

## 2021-03-04 ENCOUNTER — Telehealth: Payer: Self-pay | Admitting: Family Medicine

## 2021-03-04 DIAGNOSIS — J479 Bronchiectasis, uncomplicated: Secondary | ICD-10-CM | POA: Diagnosis not present

## 2021-03-04 DIAGNOSIS — G40409 Other generalized epilepsy and epileptic syndromes, not intractable, without status epilepticus: Secondary | ICD-10-CM | POA: Diagnosis not present

## 2021-03-04 DIAGNOSIS — I4892 Unspecified atrial flutter: Secondary | ICD-10-CM | POA: Diagnosis not present

## 2021-03-04 DIAGNOSIS — Z483 Aftercare following surgery for neoplasm: Secondary | ICD-10-CM | POA: Diagnosis not present

## 2021-03-04 DIAGNOSIS — I119 Hypertensive heart disease without heart failure: Secondary | ICD-10-CM | POA: Diagnosis not present

## 2021-03-04 DIAGNOSIS — R69 Illness, unspecified: Secondary | ICD-10-CM | POA: Diagnosis not present

## 2021-03-04 DIAGNOSIS — M810 Age-related osteoporosis without current pathological fracture: Secondary | ICD-10-CM | POA: Diagnosis not present

## 2021-03-04 DIAGNOSIS — I480 Paroxysmal atrial fibrillation: Secondary | ICD-10-CM | POA: Diagnosis not present

## 2021-03-04 DIAGNOSIS — D496 Neoplasm of unspecified behavior of brain: Secondary | ICD-10-CM | POA: Diagnosis not present

## 2021-03-04 NOTE — Telephone Encounter (Signed)
Pt is calling and would like jo ann to return her call. Pt had surgery and did not elaborate more info

## 2021-03-05 ENCOUNTER — Telehealth: Payer: Self-pay

## 2021-03-05 NOTE — Telephone Encounter (Signed)
Transition Care Management Unsuccessful Follow-up Telephone Call  Date of discharge and from where:  [facility] 02/27/21-03/01/21  Attempts:  1st Attempt  Reason for unsuccessful TCM follow-up call:  No answer/busy  Johnney Killian, RN, BSN, CCM Care Management Coordinator Phone: [phone]: [phone]

## 2021-03-05 NOTE — Telephone Encounter (Signed)
Spoke with the patient and she stated she forgot what she was calling for.  States she was having seizures, went to the hospital, does not recall a few days and later had a brain tumor excision 8 days ago, had another seizure and had to return to the hospital.  Requested a refill on Ondansetron and I advised her to contact the surgeon's office since she is post-op.  Message sent to PCP as the patient wanted her to be aware she had surgery.

## 2021-03-06 ENCOUNTER — Other Ambulatory Visit: Payer: Self-pay | Admitting: Family Medicine

## 2021-03-06 ENCOUNTER — Encounter: Payer: Self-pay | Admitting: Physician Assistant

## 2021-03-06 DIAGNOSIS — D496 Neoplasm of unspecified behavior of brain: Secondary | ICD-10-CM | POA: Diagnosis not present

## 2021-03-06 DIAGNOSIS — Z483 Aftercare following surgery for neoplasm: Secondary | ICD-10-CM | POA: Diagnosis not present

## 2021-03-06 DIAGNOSIS — I480 Paroxysmal atrial fibrillation: Secondary | ICD-10-CM | POA: Diagnosis not present

## 2021-03-06 DIAGNOSIS — M810 Age-related osteoporosis without current pathological fracture: Secondary | ICD-10-CM | POA: Diagnosis not present

## 2021-03-06 DIAGNOSIS — I119 Hypertensive heart disease without heart failure: Secondary | ICD-10-CM | POA: Diagnosis not present

## 2021-03-06 DIAGNOSIS — G40409 Other generalized epilepsy and epileptic syndromes, not intractable, without status epilepticus: Secondary | ICD-10-CM | POA: Diagnosis not present

## 2021-03-06 DIAGNOSIS — R69 Illness, unspecified: Secondary | ICD-10-CM | POA: Diagnosis not present

## 2021-03-06 DIAGNOSIS — R32 Unspecified urinary incontinence: Secondary | ICD-10-CM

## 2021-03-06 DIAGNOSIS — I4892 Unspecified atrial flutter: Secondary | ICD-10-CM | POA: Diagnosis not present

## 2021-03-06 DIAGNOSIS — J479 Bronchiectasis, uncomplicated: Secondary | ICD-10-CM | POA: Diagnosis not present

## 2021-03-06 MED ORDER — ONDANSETRON 4 MG PO TBDP
4.0000 mg | ORAL_TABLET | Freq: Four times a day (QID) | ORAL | 1 refills | Status: DC | PRN
Start: 2021-03-06 — End: 2021-05-13

## 2021-03-06 MED ORDER — FUROSEMIDE 20 MG PO TABS: 20.0000 mg | ORAL_TABLET | Freq: Every morning | ORAL | 1 refills | Status: DC

## 2021-03-06 NOTE — Telephone Encounter (Signed)
Spoke with the patient and she stated she does not feel like coming in for urine testing.  She is aware package will be left at the front desk for her husband to pick up.

## 2021-03-07 ENCOUNTER — Emergency Department (HOSPITAL_BASED_OUTPATIENT_CLINIC_OR_DEPARTMENT_OTHER)
Admission: EM | Admit: 2021-03-07 | Discharge: 2021-03-07 | Disposition: A | Discharge status: Home / Self Care | Payer: Medicare HMO | Attending: Emergency Medicine | Admitting: Emergency Medicine

## 2021-03-07 ENCOUNTER — Encounter (HOSPITAL_COMMUNITY): Payer: Self-pay

## 2021-03-07 ENCOUNTER — Emergency Department (HOSPITAL_BASED_OUTPATIENT_CLINIC_OR_DEPARTMENT_OTHER): Payer: Medicare HMO | Admitting: Radiology

## 2021-03-07 ENCOUNTER — Telehealth: Payer: Self-pay

## 2021-03-07 ENCOUNTER — Other Ambulatory Visit: Payer: Self-pay

## 2021-03-07 DIAGNOSIS — Z79899 Other long term (current) drug therapy: Secondary | ICD-10-CM | POA: Insufficient documentation

## 2021-03-07 DIAGNOSIS — Z87891 Personal history of nicotine dependence: Secondary | ICD-10-CM | POA: Diagnosis not present

## 2021-03-07 DIAGNOSIS — I10 Essential (primary) hypertension: Secondary | ICD-10-CM | POA: Diagnosis not present

## 2021-03-07 DIAGNOSIS — E039 Hypothyroidism, unspecified: Secondary | ICD-10-CM | POA: Insufficient documentation

## 2021-03-07 DIAGNOSIS — Z8540 Personal history of malignant neoplasm of unspecified female genital organ: Secondary | ICD-10-CM | POA: Insufficient documentation

## 2021-03-07 DIAGNOSIS — R059 Cough, unspecified: Secondary | ICD-10-CM | POA: Diagnosis not present

## 2021-03-07 DIAGNOSIS — I2510 Atherosclerotic heart disease of native coronary artery without angina pectoris: Secondary | ICD-10-CM | POA: Insufficient documentation

## 2021-03-07 DIAGNOSIS — Z91040 Latex allergy status: Secondary | ICD-10-CM | POA: Insufficient documentation

## 2021-03-07 DIAGNOSIS — R69 Illness, unspecified: Secondary | ICD-10-CM

## 2021-03-07 DIAGNOSIS — J811 Chronic pulmonary edema: Secondary | ICD-10-CM | POA: Diagnosis not present

## 2021-03-07 DIAGNOSIS — R062 Wheezing: Secondary | ICD-10-CM | POA: Diagnosis not present

## 2021-03-07 DIAGNOSIS — Z20822 Contact with and (suspected) exposure to covid-19: Secondary | ICD-10-CM | POA: Diagnosis not present

## 2021-03-07 DIAGNOSIS — Z96652 Presence of left artificial knee joint: Secondary | ICD-10-CM | POA: Insufficient documentation

## 2021-03-07 DIAGNOSIS — J189 Pneumonia, unspecified organism: Secondary | ICD-10-CM | POA: Diagnosis not present

## 2021-03-07 LAB — CBC WITH DIFFERENTIAL/PLATELET
Abs Immature Granulocytes: 0.11 10*3/uL — ABNORMAL HIGH (ref 0.00–0.07)
Basophils Absolute: 0 10*3/uL (ref 0.0–0.1)
Basophils Relative: 0 %
Eosinophils Absolute: 0 10*3/uL (ref 0.0–0.5)
Eosinophils Relative: 0 %
HCT: 40 % (ref 36.0–46.0)
Hemoglobin: 12.9 g/dL (ref 12.0–15.0)
Immature Granulocytes: 1 %
Lymphocytes Relative: 9 %
Lymphs Abs: 1.3 10*3/uL (ref 0.7–4.0)
MCH: 31.5 pg (ref 26.0–34.0)
MCHC: 32.3 g/dL (ref 30.0–36.0)
MCV: 97.6 fL (ref 80.0–100.0)
Monocytes Absolute: 0.8 10*3/uL (ref 0.1–1.0)
Monocytes Relative: 6 %
Neutro Abs: 11.9 10*3/uL — ABNORMAL HIGH (ref 1.7–7.7)
Neutrophils Relative %: 84 %
Platelets: 183 10*3/uL (ref 150–400)
RBC: 4.1 MIL/uL (ref 3.87–5.11)
RDW: 13 % (ref 11.5–15.5)
WBC: 14.2 10*3/uL — ABNORMAL HIGH (ref 4.0–10.5)
nRBC: 0 % (ref 0.0–0.2)

## 2021-03-07 LAB — URINALYSIS, ROUTINE W REFLEX MICROSCOPIC
Bilirubin Urine: NEGATIVE
Glucose, UA: NEGATIVE mg/dL
Hgb urine dipstick: NEGATIVE
Ketones, ur: NEGATIVE mg/dL
Nitrite: NEGATIVE
Protein, ur: NEGATIVE mg/dL
Specific Gravity, Urine: 1.007 (ref 1.005–1.030)
pH: 7 (ref 5.0–8.0)

## 2021-03-07 LAB — RESP PANEL BY RT-PCR (FLU A&B, COVID) ARPGX2
Influenza A by PCR: NEGATIVE
Influenza B by PCR: NEGATIVE
SARS Coronavirus 2 by RT PCR: NEGATIVE

## 2021-03-07 LAB — COMPREHENSIVE METABOLIC PANEL
ALT: 13 U/L (ref 0–44)
AST: 14 U/L — ABNORMAL LOW (ref 15–41)
Albumin: 3.9 g/dL (ref 3.5–5.0)
Alkaline Phosphatase: 81 U/L (ref 38–126)
Anion gap: 7 (ref 5–15)
BUN: 13 mg/dL (ref 8–23)
CO2: 29 mmol/L (ref 22–32)
Calcium: 8.9 mg/dL (ref 8.9–10.3)
Chloride: 105 mmol/L (ref 98–111)
Creatinine, Ser: 0.89 mg/dL (ref 0.44–1.00)
GFR, Estimated: >60 mL/min (ref >60)
Glucose, Bld: 120 mg/dL — ABNORMAL HIGH (ref 70–99)
Potassium: 4.3 mmol/L (ref 3.5–5.1)
Sodium: 141 mmol/L (ref 135–145)
Total Bilirubin: 0.3 mg/dL (ref 0.3–1.2)
Total Protein: 6.8 g/dL (ref 6.5–8.1)

## 2021-03-07 LAB — LACTIC ACID, PLASMA: Lactic Acid, Venous: 0.9 mmol/L (ref 0.5–1.9)

## 2021-03-07 MED ORDER — HYDROCODONE BIT-HOMATROP MBR 5-1.5 MG/5ML PO SOLN: 5.0000 mL | ORAL | 0 refills | Status: DC | PRN

## 2021-03-07 MED ORDER — AZITHROMYCIN 250 MG PO TABS
500.0000 mg | ORAL_TABLET | Freq: Once | ORAL | Status: AC
  Administered 2021-03-07: 14:00:00 500 mg via ORAL
  Filled 2021-03-07: qty 2

## 2021-03-07 MED ORDER — BENZONATATE 100 MG PO CAPS
200.0000 mg | ORAL_CAPSULE | Freq: Once | ORAL | Status: AC
  Administered 2021-03-07: 14:00:00 200 mg via ORAL
  Filled 2021-03-07: qty 2

## 2021-03-07 MED ORDER — AZITHROMYCIN 250 MG PO TABS: 250.0000 mg | ORAL_TABLET | Freq: Every day | ORAL | 0 refills | Status: DC

## 2021-03-07 MED ORDER — BENZONATATE 100 MG PO CAPS: 100.0000 mg | ORAL_CAPSULE | Freq: Three times a day (TID) | ORAL | 0 refills | Status: DC

## 2021-03-07 MED ORDER — SODIUM CHLORIDE 0.9 % IV SOLN
1.0000 g | Freq: Once | INTRAVENOUS | Status: AC
  Administered 2021-03-07: 15:00:00 1 g via INTRAVENOUS
  Filled 2021-03-07: qty 10

## 2021-03-07 MED ORDER — AMOXICILLIN-POT CLAVULANATE 875-125 MG PO TABS: 1.0000 | ORAL_TABLET | Freq: Two times a day (BID) | ORAL | 0 refills | Status: DC

## 2021-03-07 MED ORDER — ALBUTEROL SULFATE HFA 108 (90 BASE) MCG/ACT IN AERS
2.0000 | INHALATION_SPRAY | Freq: Once | RESPIRATORY_TRACT | Status: AC
  Administered 2021-03-07: 14:00:00 2 via RESPIRATORY_TRACT
  Filled 2021-03-07: qty 6.7

## 2021-03-07 NOTE — Telephone Encounter (Signed)
Unable to leave a message at the patients cell number.  I called the patient's husband and he stated the patient is currently at the [place] [facility] for treatment.

## 2021-03-07 NOTE — Discharge Instructions (Signed)
1.  At this time you are being treated for suspected pneumonia.  With your persistent cough and finding on chest x-ray you are being given 2 antibiotics.  Take Augmentin twice daily as prescribed.  Start this antibiotic tomorrow.  Start your Zithromax as prescribed tomorrow.  You are already given a dose of antibiotics in the emergency department which is all that is needed for the next 24 hours. 2.  For cough use the albuterol inhaler and Tessalon Perles.  Swallow the Tessalon Perle whole you may take 1 every 6-8 hours.  Do not chew or suck on it.  If you need additional control for cough you may use the Hycodan syrup.  You might find this sedating.  Use with caution. 3.  Due to all of your medical conditions is very important you have close follow-up.  Follow-up with your primary care doctor as soon as possible.  Return to the emergency department immediately for new worsening or concerning symptoms.

## 2021-03-07 NOTE — ED Provider Notes (Signed)
[facility] Provider Note   CSN: [id number] Arrival date & time: 03/07/21  1012     History Chief Complaint  Patient presents with   Cough    B[facility]ly [facility] is Ballard 75 y.o. female.  HPI Patient has complex medical history including left posterior parietal craniotomy with resection of brain mass by Dr. Ronnald Ballard neurosurgery on [phone].  Pathology is positive for infiltrating high-grade glioma.  Patient reports that now for the past couple of weeks she has had Ballard persistent cough and that is what brings her to the emergency department.  She reports that the cough is dry and not bring anything up.  It is keeping her awake.  She really has not been able to sleep.  Patient has been sleeping in Ballard recliner.  She reports she does feel short of breath.  She denies chest pain.  She has not run Ballard fever.  She has had no specific treatment for this.  Is been ongoing since her hospitalization.  She denies pain or swelling in her legs.  No calf pain.  No pleuritic chest pain.  Patient's husband concern is that she has not been able to get sleep due to the amount of coughing.  Patient does report that since the surgery and being on steroids, the sensation and function of her legs has improved fairly significantly.  Functionally, she does feel improved since the surgery although she feels extremely fatigued much of the time.    Past Medical History:  Diagnosis Date   Allergy    Anemia    Anxiety and depression 12/19/2009   Arthritis    Cancer ([facility])    vaginal   Degenerative disorder of eye    Diverticulosis    Dysrhythmia 01/2021   Atrial fib/flutter   Essential hypertension, benign 06/26/2009   GERD 05/27/2007   takes Nexium daily   History of gout    History of migraine many yrs ago   Hyperlipidemia    takes Pravastatin daily   Hypothyroidism    Insomnia    but doesn't take any meds   LOW BACK PAIN 05/27/2007   Osteoporosis    Seizures ([place])    last 1983, none  since then- last seizure 1988 per pt    Sleep apnea    wears CPAP    Patient Active Problem List   Diagnosis Date Noted   Seizure ([place]) 02/28/2021   S/P craniotomy 02/22/2021   Brain mass 02/17/2021   Subarachnoid hemorrhage ([place]) 02/17/2021   Acute hypoxemic respiratory failure ([place]) 02/17/2021   Atrial flutter ([place]) 02/17/2021   Seizures ([place]) 02/16/2021   Generalized anxiety disorder 08/11/2020   Insomnia 08/11/2020   Major depressive disorder, recurrent episode, moderate ([place]) 08/11/2020   Obstructive sleep apnea 08/11/2020   Ankle edema, bilateral 10/31/2019   Excessive daytime sleepiness 06/28/2019   Shortness of breath 06/28/2019   Healthcare maintenance 06/28/2019   ANA positive 06/28/2019   Bronchiectasis ([place]) 06/28/2019   Depression, major, single episode, moderate ([place]) 11/19/2018   Fatigue 03/23/2018   Left knee pain 01/12/2017   Ovarian cyst 10/30/2016   Aortic atherosclerosis ([place]) 11/19/2015   Hyperlipemia 04/03/2015   S/P revision of total knee 09/19/2014   Seizure disorder ([place]) 01/24/2014   Obesity, BMI unknown 01/24/2014   Osteopenia, stable on dexa 2010 - followed by gyn 06/15/2012   Pap smear abnormality of cervix/human papillomavirus (HPV) positive - 09/2011, followed by gyn 06/15/2012   Essential hypertension 07/03/2009   Hypothyroidism 05/27/2007  Allergic rhinitis 05/27/2007   GERD 05/27/2007    Past Surgical History:  Procedure Laterality Date   ADENOIDECTOMY     APPLICATION OF CRANIAL NAVIGATION N/Ballard 02/22/2021   Procedure: APPLICATION OF CRANIAL NAVIGATION;  Surgeon: [facility] Moore, MD;  Location: [facility];  Service: Neurosurgery;  Laterality: N/Ballard;   BACK SURGERY     lumbar x2   BRONCHIAL BIOPSY  09/12/2019   Procedure: BRONCHIAL BIOPSIES;  Surgeon: [facility] Coder, MD;  Location: [facility];  Service: Endoscopy;;   BRONCHIAL WASHINGS  09/12/2019   Procedure: BRONCHIAL WASHINGS;  Surgeon: [facility] Coder, MD;  [facility]: [facility];   Service: Endoscopy;;   COLONOSCOPY  2009   CRANIOTOMY N/Ballard 02/22/2021   Procedure: CRANIOTOMY FOR TUMOR EXCISION;  Surgeon: [facility] Moore, MD;  Location: [facility];  Service: Neurosurgery;  Laterality: N/Ballard;   ESOPHAGOGASTRODUODENOSCOPY     Heel spurs Bilateral    HEMOSTASIS CONTROL  09/12/2019   Procedure: HEMOSTASIS CONTROL;  Surgeon: [facility] Coder, MD;  [facility];  Service: Endoscopy;;  epi   KNEE ARTHROPLASTY Left 11/07/2013   Procedure: COMPUTER ASSISTED TOTAL KNEE ARTHROPLASTY;  Surgeon: [facility] Killings, MD;  Location: [facility];  Service: Orthopedics;  Laterality: Left;  Left Total Knee Arthroplasty, Cemented, Computer Assist   TONSILLECTOMY     TOTAL KNEE REVISION Left 09/19/2014   Procedure: LEFT TOTAL KNEE REVISION;  Surgeon: [facility] Koyanagi, MD;  Location: [facility];  Service: Orthopedics;  Laterality: Left;   UPPER GASTROINTESTINAL ENDOSCOPY     VIDEO BRONCHOSCOPY N/Ballard 09/12/2019   Procedure: VIDEO BRONCHOSCOPY WITH FLUORO;  Surgeon: [facility] Coder, MD;  [facility];  Service: Endoscopy;  Laterality: N/Ballard;     OB History   No obstetric history on file.     Family History  Problem Relation Age of Onset   Aortic dissection Father    Arthritis Mother    Stroke Mother 64   Heart murmur Other    Throat cancer Sister    Colon cancer Neg Hx    Esophageal cancer Neg Hx    Stomach cancer Neg Hx    Rectal cancer Neg Hx    Colon polyps Neg Hx     Social History   Tobacco Use   Smoking status: Former    Packs/day: 0.25    Years: 20.00    Pack years: 5.00    Types: Cigarettes    Quit date: 09/08/1978    Years since quitting: 42.5   Smokeless tobacco: Never   Tobacco comments:    quit smoking 76yrs ago  Vaping Use   Vaping Use: Never used  Substance Use Topics   Alcohol use: No   Drug use: No    Home Medications Prior to Admission medications   Medication Sig Start Date End Date Taking? Authorizing Provider  albuterol (VENTOLIN HFA) 108 (90 Base)  MCG/ACT inhaler Inhale 1-2 puffs into the lungs every 6 (six) hours as needed for wheezing or shortness of breath. 05/11/20  Yes [facility] Lanius A, PA-C  amoxicillin-clavulanate (AUGMENTIN) 875-125 MG tablet Take 1 tablet by mouth 2 (two) times daily. One po bid x 7 days 03/07/21  Yes Cambrey Lupi, Jeannie Done, MD  azithromycin (ZITHROMAX) 250 MG tablet Take 1 tablet (250 mg total) by mouth daily. 03/08/21  Yes Charlesetta Shanks, MD  benzonatate (TESSALON) 100 MG capsule Take 1 capsule (100 mg total) by mouth every 8 (eight) hours. 03/07/21  Yes Charlesetta Shanks, MD  busPIRone (BUSPAR) 10 MG tablet TAKE  1 TABLET BY MOUTH TWICE [facility] DAY Patient taking differently: Take 10 mg by mouth 2 (two) times daily. 02/18/21  Yes Donnal Moat T, PA-C  cholecalciferol (VITAMIN D3) 25 MCG (1000 UNIT) tablet Take 1,000 Units by mouth every morning.   Yes [provider]  dexamethasone (DECADRON) 4 MG tablet Take 0.5 tablets (2 mg total) by mouth 2 (two) times daily. 02/27/21  Yes [facility], MD  EPINEPHrine 0.3 mg/0.3 mL IJ SOAJ injection Inject 0.3 mg into the muscle once as needed for anaphylaxis. 09/14/19  Yes [provider]  esomeprazole (NEXIUM) 20 MG capsule Take 2 capsules (40 mg total) by mouth daily before breakfast. Patient taking differently: Take 20 mg by mouth daily before breakfast. 03/21/19  Yes Laurey Morale, MD  ferrous sulfate 325 (65 FE) MG tablet Take 325 mg by mouth 3 (three) times [facility]ard week.   Yes [provider]  furosemide (LASIX) 20 MG tablet Take 1 tablet (20 mg total) by mouth every morning. 03/06/21  Yes Koberlein, Junell C, MD  HYDROcodone bit-homatropine (HYCODAN) 5-1.5 MG/5ML syrup Take 5 mLs by mouth every 4 (four) hours as needed for cough. 03/07/21  Yes Charlesetta Shanks, MD  HYDROcodone-acetaminophen (NORCO/VICODIN) 5-325 MG tablet Take 1 tablet by mouth every 8 (eight) hours as needed for moderate pain. 02/27/21  Yes [facility], MD  levETIRAcetam (KEPPRA) 500 MG tablet  Take 1 tablet (500 mg total) by mouth 2 (two) times daily. 03/01/21  Yes Bonnielee Haff, MD  levocetirizine (XYZAL) 5 MG tablet Take 5 mg by mouth at bedtime. 03/05/16  Yes [provider]  levothyroxine (SYNTHROID) 88 MCG tablet Take 1 tablet (88 mcg total) by mouth daily. Patient taking differently: Take 88 mcg by mouth daily before breakfast. 10/11/20  Yes Philemon Kingdom, MD  lisinopril (ZESTRIL) 5 MG tablet TAKE 1 TABLET BY MOUTH EVERY DAY Patient taking differently: Take 5 mg by mouth every morning. 10/19/20  Yes Koberlein, Junell C, MD  ondansetron (ZOFRAN ODT) 4 MG disintegrating tablet Take 1 tablet (4 mg total) by mouth every 6 (six) hours as needed for nausea or vomiting. 03/06/21  Yes Koberlein, Junell C, MD  pravastatin (PRAVACHOL) 20 MG tablet TAKE 1 TABLET (20 MG TOTAL) BY MOUTH DAILY. Patient taking differently: Take 20 mg by mouth at bedtime. 10/19/20  Yes Koberlein, Junell C, MD  sertraline (ZOLOFT) 100 MG tablet Take 2 tablets (200 mg total) by mouth daily. Patient taking differently: Take 100 mg by mouth 2 (two) times daily. 09/04/20  Yes Donnal Moat T, PA-C  solifenacin (VESICARE) 10 MG tablet Take 10 mg by mouth at bedtime. 01/24/21  Yes [provider]  vitamin B-12 (CYANOCOBALAMIN) 1000 MCG tablet Take 1,000 mcg by mouth every morning.   Yes [provider]  zonisamide (ZONEGRAN) 100 MG capsule Take 2 capsules twice Ballard day Patient taking differently: Take 200 mg by mouth 2 (two) times daily. Seizure medication 11/07/20  Yes Cameron Sprang, MD  Budeson-Glycopyrrol-Formoterol (BREZTRI AEROSPHERE) 160-9-4.8 MCG/ACT AERO Inhale 2 puffs into the lungs 2 (two) times daily. Patient not taking: Reported on 02/13/2021 12/04/20   Caren Macadam, MD  Spacer/Aero-Holding Chambers (AEROCHAMBER PLUS) inhaler Use as instructed 11/16/20   Caren Macadam, MD    Allergies    Lamotrigine, Bupropion, Carbamazepine, Levetiracetam, Tramadol, Adhesive [tape],  Amitriptyline, Clarithromycin, Doxycycline, Nickel, Other, Topamax [topiramate], Estradiol, Latex, and Phenytoin sodium extended  Review of Systems   Review of Systems 10 systems reviewed and negative except as  per HPI Physical Exam Updated Vital Signs BP (!) 139/54 (BP Location: Right Arm)   Pulse 72   Temp 98.4 F (36.9 C) (Oral)   Resp 20   Ht 5\' 5"  (1.651 m)   Wt 104.1 kg   SpO2 98%   BMI 38.19 kg/m   Physical Exam Constitutional:      Comments: Alert.  Interactive.  No respiratory distress at rest.  Intermittent dry cough.  Pale in appearance  HENT:     Mouth/Throat:     Pharynx: Oropharynx is clear.  Eyes:     Extraocular Movements: Extraocular movements intact.  Cardiovascular:     Rate and Rhythm: Normal rate and regular rhythm.  Pulmonary:     Effort: Pulmonary effort is normal.     Breath sounds: Normal breath sounds.     Comments: Intermittent dry cough.  No respiratory distress at rest. Abdominal:     General: There is no distension.     Palpations: Abdomen is soft.     Tenderness: There is no abdominal tenderness. There is no guarding.  Musculoskeletal:     Cervical back: Neck supple.     Comments: No peripheral edema.  Calves are soft and nontender.  No wounds or signs of cellulitis.  Dorsalis pedis pulses are 2+ and symmetric.  Skin:    General: Skin is warm and dry.     Coloration: Skin is pale.  Neurological:     Comments: Patient is alert and interactive.  She is giving history.  Occasionally she has to pause and think but does produce appropriate history.  She follows commands for sitting forward in the stretcher no focal motor deficits observed.  Psychiatric:        Mood and Affect: Mood normal.    ED Results / Procedures / Treatments   Labs (all labs ordered are listed, but only abnormal results are displayed) Labs Reviewed  COMPREHENSIVE METABOLIC PANEL - Abnormal; Notable for the following components:      Result Value   Glucose, Bld 120  (*)    AST 14 (*)    All other components within normal limits  CBC WITH DIFFERENTIAL/PLATELET - Abnormal; Notable for the following components:   WBC 14.2 (*)    Neutro Abs 11.9 (*)    Abs Immature Granulocytes 0.11 (*)    All other components within normal limits  URINALYSIS, ROUTINE W REFLEX MICROSCOPIC - Abnormal; Notable for the following components:   Leukocytes,Ua SMALL (*)    Bacteria, UA RARE (*)    Non Squamous Epithelial 0-5 (*)    All other components within normal limits  RESP PANEL BY RT-PCR (FLU Ballard&B, COVID) ARPGX2  URINE CULTURE  LACTIC ACID, PLASMA  LACTIC ACID, PLASMA    EKG None  Radiology DG Chest 2 View  Result Date: 03/07/2021 CLINICAL DATA:  Cough, wheezing EXAM: CHEST - 2 VIEW COMPARISON:  02/27/2021 FINDINGS: Stable cardiomediastinal contours. Mild pulmonary vascular congestion. Small focal interstitial opacity within the periphery of the right upper lobe. No pleural effusion or pneumothorax. IMPRESSION: Small focal interstitial opacity within the periphery of the right upper lobe, suspicious for pneumonia. Electronically Signed   By: Davina Poke D.O.   On: 03/07/2021 12:43    Procedures Procedures   Medications Ordered in ED Medications  cefTRIAXone (ROCEPHIN) 1 g in sodium chloride 0.9 % 100 mL IVPB (1 g Intravenous New Bag/Given 03/07/21 1435)  azithromycin (ZITHROMAX) tablet 500 mg (500 mg Oral Given 03/07/21 1423)  benzonatate (TESSALON) capsule  200 mg (200 mg Oral Given 03/07/21 1421)  albuterol (VENTOLIN HFA) 108 (90 Base) MCG/ACT inhaler 2 puff (2 puffs Inhalation Given 03/07/21 1408)    ED Course  I have reviewed the triage vital signs and the nursing notes.  Pertinent labs & imaging results that were available during my care of the patient were reviewed by me and considered in my medical decision making (see chart for details).    MDM Rules/Calculators/Ballard&P                           Patient has complex medical history with brain  tumor and seizure disorder.  Patient's mental status is good.  She is alert and appropriate.  She is situationally oriented and giving history.  Patient has had Ballard persistent cough now for several weeks.  Cough is keeping her awake at night and is experiencing shortness of breath.  Chest x-ray shows Ballard focal area of pneumonia.  With clinical symptoms combined with chest x-ray finding will opt to treat with Ballard first dose of Rocephin and Zithromax in the emergency department.  Patient will be continued on Augmentin and azithromycin.  For control of symptoms plan will be for Tessalon Perles and albuterol inhaler.  Hydrocodone as needed for additional control of symptoms.  Due to complex medical history patient will need close follow-up and monitoring.  I recommend she follow-up with her PCP early in the week and return to emergency department immediately with worsening or changing symptoms. Final Clinical Impression(s) / ED Diagnoses Final diagnoses:  Community acquired pneumonia of right lung, unspecified part of lung  Severe comorbid illness    Rx / DC Orders ED Discharge Orders          Ordered    azithromycin (ZITHROMAX) 250 MG tablet  Daily        03/07/21 1530    amoxicillin-clavulanate (AUGMENTIN) 875-125 MG tablet  2 times daily        03/07/21 1530    benzonatate (TESSALON) 100 MG capsule  Every 8 hours        03/07/21 1530    HYDROcodone bit-homatropine (HYCODAN) 5-1.5 MG/5ML syrup  Every 4 hours PRN        03/07/21 1530             Charlesetta Shanks, MD 03/07/21 1535

## 2021-03-07 NOTE — ED Triage Notes (Signed)
Pt via pov from home with cough x 1 week. Pt had brain surgery 2 weeks ago and also had readmission to hospital for seizure activity . Husband wants her to have cough medication prescribed and could not get her in with PCP. Pt has chronic lung condition which leads her to have pna sometimes. Pt had home health nurse yesterday who said everything is normal. Pt alert & oriented, seems tired during triage.

## 2021-03-07 NOTE — Telephone Encounter (Signed)
Patient called to schedule an appt when asked what she needed to be seen for patient stated she was having SoB and coughing patient was then transferred to a Triage nurse,  patient was transferred back to front office to schedule an appt and was told no appts were available today and that she need to be seen at an UC or ED patient hung up.

## 2021-03-08 ENCOUNTER — Telehealth (HOSPITAL_COMMUNITY): Payer: Self-pay | Admitting: Emergency Medicine

## 2021-03-08 ENCOUNTER — Other Ambulatory Visit (HOSPITAL_BASED_OUTPATIENT_CLINIC_OR_DEPARTMENT_OTHER): Payer: Self-pay

## 2021-03-08 MED ORDER — HYDROCODONE BIT-HOMATROP MBR 5-1.5 MG/5ML PO SOLN
5.0000 mL | ORAL | 0 refills | Status: DC | PRN
  Filled 2021-03-08: qty 120, 4d supply, fill #0

## 2021-03-08 NOTE — Telephone Encounter (Unsigned)
[facility] from pharmacist to change Hycodan prescription to [place] due to [facility] being out of stock.  Description destination changed.

## 2021-03-09 DIAGNOSIS — I4892 Unspecified atrial flutter: Secondary | ICD-10-CM | POA: Diagnosis not present

## 2021-03-09 LAB — URINE CULTURE: Culture: >=100000 — AB

## 2021-03-10 ENCOUNTER — Telehealth: Payer: Self-pay | Admitting: Emergency Medicine

## 2021-03-10 NOTE — Telephone Encounter (Signed)
Post ED Visit - Positive Culture Follow-up: Successful Patient Follow-Up  Culture assessed and recommendations reviewed by:  []  Elenor Quinones, Pharm.D. []  Heide Guile, Pharm.D., BCPS AQ-ID []  Parks Neptune, Pharm.D., BCPS []  Alycia Rossetti, Pharm.D., BCPS []  [place], Pharm.D., BCPS, AAHIVP []  [name], Pharm.D., BCPS, AAHIVP []  Salome Arnt, PharmD, BCPS []  Johnnette Gourd, PharmD, BCPS []  Hughes Better, PharmD, BCPS [x]  Lorelei Pont, PharmD  Positive urine culture  []  Patient discharged without antimicrobial prescription and treatment is now indicated []  Organism is resistant to prescribed ED discharge antimicrobial []  Patient with positive blood cultures  Changes discussed with ED provider: [name], MD New antibiotic prescription nor new RX Plan: Have follow-up with oncology scheduled 11/21 or with PCP if unable to keep oncology appointment 11/21.    Contacted patient, date 03/10/2021, time 1630 Notified patient of plan. Patient denies any urinary symptoms and confirms appt with oncology tomorrow. Instructed to return for any worsening symptoms.   [facility] 03/10/2021, 6:14 PM

## 2021-03-10 NOTE — Progress Notes (Signed)
ED Antimicrobial Stewardship Positive Culture Follow Up   Beverly Ballard is an 75 y.o. female who presented to [facility] on 03/07/2021 with a chief complaint of  Chief Complaint  Patient presents with   Cough    Recent Results (from the past 720 hour(s))  Resp Panel by RT-PCR (Flu A&B, Covid) Nasopharyngeal Swab     Status: None   Collection Time: 02/16/21  5:45 PM   Specimen: Nasopharyngeal Swab; Nasopharyngeal(NP) swabs in vial transport medium  Result Value Ref Range Status   SARS Coronavirus 2 by RT PCR NEGATIVE NEGATIVE Final    Comment: (NOTE) SARS-CoV-2 target nucleic acids are NOT DETECTED.  The SARS-CoV-2 RNA is generally detectable in upper respiratory specimens during the acute phase of infection. The lowest concentration of SARS-CoV-2 viral copies this assay can detect is 138 copies/mL. A negative result does not preclude SARS-Cov-2 infection and should not be used as the sole basis for treatment or other patient management decisions. A negative result may occur with  improper specimen collection/handling, submission of specimen other than nasopharyngeal swab, presence of viral mutation(s) within the areas targeted by this assay, and inadequate number of viral copies(<138 copies/mL). A negative result must be combined with clinical observations, patient history, and epidemiological information. The expected result is Negative.  Fact Sheet for Patients:  EntrepreneurPulse.com.au  Fact Sheet for Healthcare Providers:  IncredibleEmployment.be  This test is no t yet approved or cleared by the [place] FDA and  has been authorized for detection and/or diagnosis of SARS-CoV-2 by FDA under an Emergency Use Authorization (EUA). This EUA will remain  in effect (meaning this test can be used) for the duration of the COVID-19 declaration under Section 564(b)(1) of the Act, 21 U.S.C.section 360bbb-3(b)(1), unless the authorization is  terminated  or revoked sooner.       Influenza A by PCR NEGATIVE NEGATIVE Final   Influenza B by PCR NEGATIVE NEGATIVE Final    Comment: (NOTE) The Xpert Xpress SARS-CoV-2/FLU/RSV plus assay is intended as an aid in the diagnosis of influenza from Nasopharyngeal swab specimens and should not be used as a sole basis for treatment. Nasal washings and aspirates are unacceptable for Xpert Xpress SARS-CoV-2/FLU/RSV testing.  Fact Sheet for Patients: EntrepreneurPulse.com.au  Fact Sheet for Healthcare Providers: IncredibleEmployment.be  This test is not yet approved or cleared by the [place] FDA and has been authorized for detection and/or diagnosis of SARS-CoV-2 by FDA under an Emergency Use Authorization (EUA). This EUA will remain in effect (meaning this test can be used) for the duration of the COVID-19 declaration under Section 564(b)(1) of the Act, 21 U.S.C. section 360bbb-3(b)(1), unless the authorization is terminated or revoked.  Performed at [facility], [place] [address], [place], [place] [address]   Urine Culture     Status: Abnormal   Collection Time: 02/18/21  5:20 AM   Specimen: Urine, Clean Catch  Result Value Ref Range Status   Specimen Description URINE, CLEAN CATCH  Final   Special Requests   Final    NONE Performed at [facility], [place] [address] [place], [place] [address]    Culture MULTIPLE SPECIES PRESENT, SUGGEST RECOLLECTION (A)  Final   Report Status 02/19/2021 FINAL  Final  SARS Coronavirus 2 by RT PCR (hospital order, performed in [facility] hospital lab) Nasopharyngeal Nasopharyngeal Swab     Status: None   Collection Time: 02/22/21  8:08 AM   Specimen: Nasopharyngeal Swab  Result Value Ref Range Status  SARS Coronavirus 2 NEGATIVE NEGATIVE Final    Comment: (NOTE) SARS-CoV-2 target nucleic acids are NOT DETECTED.  The SARS-CoV-2 RNA is generally detectable in upper and lower respiratory  specimens during the acute phase of infection. The lowest concentration of SARS-CoV-2 viral copies this assay can detect is 250 copies / mL. A negative result does not preclude SARS-CoV-2 infection and should not be used as the sole basis for treatment or other patient management decisions.  A negative result may occur with improper specimen collection / handling, submission of specimen other than nasopharyngeal swab, presence of viral mutation(s) within the areas targeted by this assay, and inadequate number of viral copies (<250 copies / mL). A negative result must be combined with clinical observations, patient history, and epidemiological information.  Fact Sheet for Patients:   StrictlyIdeas.no  Fact Sheet for Healthcare Providers: BankingDealers.co.za  This test is not yet approved or  cleared by the [place] FDA and has been authorized for detection and/or diagnosis of SARS-CoV-2 by FDA under an Emergency Use Authorization (EUA).  This EUA will remain in effect (meaning this test can be used) for the duration of the COVID-19 declaration under Section 564(b)(1) of the Act, 21 U.S.C. section 360bbb-3(b)(1), unless the authorization is terminated or revoked sooner.  Performed at [facility], [place] [address] [place], [place] [address]   MRSA Next Gen by PCR, Nasal     Status: None   Collection Time: 02/22/21  5:50 PM   Specimen: Nasal Mucosa; Nasal Swab  Result Value Ref Range Status   MRSA by PCR Next Gen NOT DETECTED NOT DETECTED Final    Comment: (NOTE) The GeneXpert MRSA Assay (FDA approved for NASAL specimens only), is one component of a comprehensive MRSA colonization surveillance program. It is not intended to diagnose MRSA infection nor to guide or monitor treatment for MRSA infections. Test performance is not FDA approved in patients less than 68 years old. Performed at [facility], [place] [address], [place],  [address]   Resp Panel by RT-PCR (Flu A&B, Covid) Nasopharyngeal Swab     Status: None   Collection Time: 02/27/21  8:19 PM   Specimen: Nasopharyngeal Swab; Nasopharyngeal(NP) swabs in vial transport medium  Result Value Ref Range Status   SARS Coronavirus 2 by RT PCR NEGATIVE NEGATIVE Final    Comment: (NOTE) SARS-CoV-2 target nucleic acids are NOT DETECTED.  The SARS-CoV-2 RNA is generally detectable in upper respiratory specimens during the acute phase of infection. The lowest concentration of SARS-CoV-2 viral copies this assay can detect is 138 copies/mL. A negative result does not preclude SARS-Cov-2 infection and should not be used as the sole basis for treatment or other patient management decisions. A negative result may occur with  improper specimen collection/handling, submission of specimen other than nasopharyngeal swab, presence of viral mutation(s) within the areas targeted by this assay, and inadequate number of viral copies(<138 copies/mL). A negative result must be combined with clinical observations, patient history, and epidemiological information. The expected result is Negative.  Fact Sheet for Patients:  EntrepreneurPulse.com.au  Fact Sheet for Healthcare Providers:  IncredibleEmployment.be  This test is no t yet approved or cleared by the [place] FDA and  has been authorized for detection and/or diagnosis of SARS-CoV-2 by FDA under an Emergency Use Authorization (EUA). This EUA will remain  in effect (meaning this test can be used) for the duration of the COVID-19 declaration under Section 564(b)(1) of the Act, 21 U.S.C.section 360bbb-3(b)(1),  unless the authorization is terminated  or revoked sooner.       Influenza A by PCR NEGATIVE NEGATIVE Final   Influenza B by PCR NEGATIVE NEGATIVE Final    Comment: (NOTE) The Xpert Xpress SARS-CoV-2/FLU/RSV plus assay is intended as an aid in the  diagnosis of influenza from Nasopharyngeal swab specimens and should not be used as a sole basis for treatment. Nasal washings and aspirates are unacceptable for Xpert Xpress SARS-CoV-2/FLU/RSV testing.  Fact Sheet for Patients: EntrepreneurPulse.com.au  Fact Sheet for Healthcare Providers: IncredibleEmployment.be  This test is not yet approved or cleared by the [place] FDA and has been authorized for detection and/or diagnosis of SARS-CoV-2 by FDA under an Emergency Use Authorization (EUA). This EUA will remain in effect (meaning this test can be used) for the duration of the COVID-19 declaration under Section 564(b)(1) of the Act, 21 U.S.C. section 360bbb-3(b)(1), unless the authorization is terminated or revoked.  Performed at [facility], [place] [address] [place], [place] [address]   Urine Culture     Status: Abnormal   Collection Time: 03/07/21 11:45 AM   Specimen: Urine, Clean Catch  Result Value Ref Range Status   Specimen Description   Final    URINE, CLEAN CATCH Performed at [facility], [address] [place], [place] [address]    Special Requests   Final    NONE Performed at [facility], [address] [place], [place] [address]    Culture (A)  Final    >=100,000 COLONIES/mL ENTEROCOCCUS FAECALIS 30,000 COLONIES/mL PSEUDOMONAS AERUGINOSA    Report Status 03/09/2021 FINAL  Final   Organism ID, Bacteria ENTEROCOCCUS FAECALIS (A)  Final   Organism ID, Bacteria PSEUDOMONAS AERUGINOSA (A)  Final      Susceptibility   Enterococcus faecalis - MIC*    AMPICILLIN <=2 SENSITIVE Sensitive     NITROFURANTOIN <=16 SENSITIVE Sensitive     VANCOMYCIN 1 SENSITIVE Sensitive     * >=100,000 COLONIES/mL ENTEROCOCCUS FAECALIS   Pseudomonas aeruginosa - MIC*    CEFTAZIDIME 2 SENSITIVE Sensitive     CIPROFLOXACIN <=0.25 SENSITIVE Sensitive     GENTAMICIN <=1 SENSITIVE Sensitive      IMIPENEM 2 SENSITIVE Sensitive     PIP/TAZO <=4 SENSITIVE Sensitive     CEFEPIME 2 SENSITIVE Sensitive     * 30,000 COLONIES/mL PSEUDOMONAS AERUGINOSA  Resp Panel by RT-PCR (Flu A&B, Covid) Nasopharyngeal Swab     Status: None   Collection Time: 03/07/21 12:40 PM   Specimen: Nasopharyngeal Swab; Nasopharyngeal(NP) swabs in vial transport medium  Result Value Ref Range Status   SARS Coronavirus 2 by RT PCR NEGATIVE NEGATIVE Final    Comment: (NOTE) SARS-CoV-2 target nucleic acids are NOT DETECTED.  The SARS-CoV-2 RNA is generally detectable in upper respiratory specimens during the acute phase of infection. The lowest concentration of SARS-CoV-2 viral copies this assay can detect is 138 copies/mL. A negative result does not preclude SARS-Cov-2 infection and should not be used as the sole basis for treatment or other patient management decisions. A negative result may occur with  improper specimen collection/handling, submission of specimen other than nasopharyngeal swab, presence of viral mutation(s) within the areas targeted by this assay, and inadequate number of viral copies(<138 copies/mL). A negative result must be combined with clinical observations, patient history, and epidemiological information. The expected result is Negative.  Fact Sheet for Patients:  EntrepreneurPulse.com.au  Fact Sheet for Healthcare Providers:  IncredibleEmployment.be  This test is no t yet  approved or cleared by the [place] and  has been authorized for detection and/or diagnosis of SARS-CoV-2 by FDA under an Emergency Use Authorization (EUA). This EUA will remain  in effect (meaning this test can be used) for the duration of the COVID-19 declaration under Section 564(b)(1) of the Act, 21 U.S.C.section 360bbb-3(b)(1), unless the authorization is terminated  or revoked sooner.       Influenza A by PCR NEGATIVE NEGATIVE Final   Influenza B by PCR  NEGATIVE NEGATIVE Final    Comment: (NOTE) The Xpert Xpress SARS-CoV-2/FLU/RSV plus assay is intended as an aid in the diagnosis of influenza from Nasopharyngeal swab specimens and should not be used as a sole basis for treatment. Nasal washings and aspirates are unacceptable for Xpert Xpress SARS-CoV-2/FLU/RSV testing.  Fact Sheet for Patients: EntrepreneurPulse.com.au  Fact Sheet for Healthcare Providers: IncredibleEmployment.be  This test is not yet approved or cleared by the [place] FDA and has been authorized for detection and/or diagnosis of SARS-CoV-2 by FDA under an Emergency Use Authorization (EUA). This EUA will remain in effect (meaning this test can be used) for the duration of the COVID-19 declaration under Section 564(b)(1) of the Act, 21 U.S.C. section 360bbb-3(b)(1), unless the authorization is terminated or revoked.  Performed at [facility], [address] [place], [place] [address]     [x]  Treated with augmentin and doxycycline for CAP, the pseudomonas that grew in urine (in very small numbers) is not susceptible to either agent. However, no symptoms of UTI noted and UA is unremarkable. Patient appears to have appointment with oncology tomorrow (11/21). MD Ray recommends patient follow-up at this time outpatient. Should patient be experiencing symptoms of UTI and appt with oncology falls through, patient needs to f/u with PCP.  ED Provider: Curlene Labrum, PharmD, BCPS 03/10/2021 10:11 AM ED Clinical Pharmacist -  [phone]

## 2021-03-11 ENCOUNTER — Telehealth: Payer: Self-pay | Admitting: *Deleted

## 2021-03-11 ENCOUNTER — Telehealth: Payer: Self-pay | Admitting: Family Medicine

## 2021-03-11 ENCOUNTER — Ambulatory Visit: Payer: Medicare HMO | Admitting: Internal Medicine

## 2021-03-11 DIAGNOSIS — I480 Paroxysmal atrial fibrillation: Secondary | ICD-10-CM | POA: Diagnosis not present

## 2021-03-11 DIAGNOSIS — J479 Bronchiectasis, uncomplicated: Secondary | ICD-10-CM | POA: Diagnosis not present

## 2021-03-11 DIAGNOSIS — R69 Illness, unspecified: Secondary | ICD-10-CM | POA: Diagnosis not present

## 2021-03-11 DIAGNOSIS — D496 Neoplasm of unspecified behavior of brain: Secondary | ICD-10-CM | POA: Diagnosis not present

## 2021-03-11 DIAGNOSIS — I119 Hypertensive heart disease without heart failure: Secondary | ICD-10-CM | POA: Diagnosis not present

## 2021-03-11 DIAGNOSIS — I4892 Unspecified atrial flutter: Secondary | ICD-10-CM | POA: Diagnosis not present

## 2021-03-11 DIAGNOSIS — G40409 Other generalized epilepsy and epileptic syndromes, not intractable, without status epilepticus: Secondary | ICD-10-CM | POA: Diagnosis not present

## 2021-03-11 DIAGNOSIS — Z483 Aftercare following surgery for neoplasm: Secondary | ICD-10-CM | POA: Diagnosis not present

## 2021-03-11 DIAGNOSIS — M810 Age-related osteoporosis without current pathological fracture: Secondary | ICD-10-CM | POA: Diagnosis not present

## 2021-03-11 NOTE — Telephone Encounter (Signed)
Patient no showed for appointment.  Called to reschedule, left message pending call back.

## 2021-03-11 NOTE — Telephone Encounter (Signed)
Patient's husband called nurse line stating his wife has been diagnosed with Pneumonia, that a prescription for Zithromax was handwritten, and he was told to call the dr to get it.

## 2021-03-12 ENCOUNTER — Telehealth: Payer: Self-pay

## 2021-03-12 NOTE — Telephone Encounter (Signed)
Transition Care Management Follow-up Telephone Call Date of discharge and from where: 03/01/2021  Beverly Ballard How have you been since you were released from the hospital? Doing ok Any questions or concerns? No  Items Reviewed: Did the pt receive and understand the discharge instructions provided? Yes  Medications obtained and verified? Yes  Other? No  Any new allergies since your discharge? No  Dietary orders reviewed? No Do you have support at home? Yes   Home Care and Equipment/Supplies: Were home health services ordered? not applicable If so, what is the name of the agency? Has the agency set up a time to come to the patient's home? not applicable Were any new equipment or medical supplies ordered?   What is the name of the medical supply agency?  Were you able to get the supplies/equipment? not applicable Do you have any questions related to the use of the equipment or supplies?   Functional Questionnaire: (I = Independent and D = Dependent) ADLs: I  Bathing/Dressing- I  Meal Prep- I  Eating- I  Maintaining continence- I  Transferring/Ambulation- I  Managing Meds- I  Follow up appointments reviewed:  PCP Hospital f/u appt confirmed? Yes  Scheduled to see PCP on 03/13/2021  Specialist Hospital f/u appt confirmed? No  Are transportation arrangements needed? No  If their condition worsens, is the pt aware to call PCP or go to the Emergency Dept.? Yes Was the patient provided with contact information for the PCP's office or ED? Yes Was to pt encouraged to call back with questions or concerns? Yes    [name], RN, BSN, CEN [facility] [employer] [phone]

## 2021-03-12 NOTE — Telephone Encounter (Signed)
Spoke with Mr Mccardle, informed him of the message below and he stated the patient is taking the medication.

## 2021-03-12 NOTE — Telephone Encounter (Signed)
Please clarify with husband. She was seen and treated in ER/urgent care with azithromycin and augmentin; both were sent to [facility] [address] on date of visit electronically. She needs to be on this treatment, so if they didn't get it, and pharmacy doesn't have it for some reason, I am ok with you sending it in for her again, but it appears to have gone through in the system.

## 2021-03-13 ENCOUNTER — Ambulatory Visit (INDEPENDENT_AMBULATORY_CARE_PROVIDER_SITE_OTHER): Payer: Medicare HMO | Admitting: Family Medicine

## 2021-03-13 ENCOUNTER — Telehealth: Payer: Medicare HMO | Admitting: Family Medicine

## 2021-03-13 ENCOUNTER — Encounter: Payer: Self-pay | Admitting: Family Medicine

## 2021-03-13 VITALS — BP 122/58 | HR 83 | Temp 98.9°F | Ht 65.0 in

## 2021-03-13 DIAGNOSIS — I10 Essential (primary) hypertension: Secondary | ICD-10-CM | POA: Diagnosis not present

## 2021-03-13 DIAGNOSIS — F411 Generalized anxiety disorder: Secondary | ICD-10-CM

## 2021-03-13 DIAGNOSIS — F331 Major depressive disorder, recurrent, moderate: Secondary | ICD-10-CM

## 2021-03-13 DIAGNOSIS — R69 Illness, unspecified: Secondary | ICD-10-CM | POA: Diagnosis not present

## 2021-03-13 DIAGNOSIS — M810 Age-related osteoporosis without current pathological fracture: Secondary | ICD-10-CM | POA: Diagnosis not present

## 2021-03-13 DIAGNOSIS — G40409 Other generalized epilepsy and epileptic syndromes, not intractable, without status epilepticus: Secondary | ICD-10-CM | POA: Diagnosis not present

## 2021-03-13 DIAGNOSIS — Z9889 Other specified postprocedural states: Secondary | ICD-10-CM

## 2021-03-13 DIAGNOSIS — R059 Cough, unspecified: Secondary | ICD-10-CM

## 2021-03-13 DIAGNOSIS — D496 Neoplasm of unspecified behavior of brain: Secondary | ICD-10-CM | POA: Diagnosis not present

## 2021-03-13 DIAGNOSIS — I119 Hypertensive heart disease without heart failure: Secondary | ICD-10-CM | POA: Diagnosis not present

## 2021-03-13 DIAGNOSIS — Z483 Aftercare following surgery for neoplasm: Secondary | ICD-10-CM | POA: Diagnosis not present

## 2021-03-13 DIAGNOSIS — C719 Malignant neoplasm of brain, unspecified: Secondary | ICD-10-CM | POA: Diagnosis not present

## 2021-03-13 DIAGNOSIS — I480 Paroxysmal atrial fibrillation: Secondary | ICD-10-CM | POA: Diagnosis not present

## 2021-03-13 DIAGNOSIS — I4892 Unspecified atrial flutter: Secondary | ICD-10-CM | POA: Diagnosis not present

## 2021-03-13 DIAGNOSIS — J479 Bronchiectasis, uncomplicated: Secondary | ICD-10-CM | POA: Diagnosis not present

## 2021-03-13 MED ORDER — ALBUTEROL SULFATE (2.5 MG/3ML) 0.083% IN NEBU
2.5000 mg | INHALATION_SOLUTION | Freq: Once | RESPIRATORY_TRACT | Status: AC
  Administered 2021-03-13: 2.5 mg via RESPIRATORY_TRACT

## 2021-03-13 MED ORDER — NYSTATIN 100000 UNIT/ML MT SUSP: 5.0000 mL | Freq: Four times a day (QID) | OROMUCOSAL | 1 refills | Status: AC

## 2021-03-13 NOTE — Patient Instructions (Signed)
Use the albuterol 2 puffs into the spacing chamber and then 6 breaths in and out of the spacing chamber at least twice daily for next week.

## 2021-03-13 NOTE — Progress Notes (Signed)
Beverly Ballard DOB: 1945-06-15 Encounter date: 03/13/2021  This is a 75 y.o. female who presents with Chief Complaint  Patient presents with   Follow-up    History of present illness: Hospital follow up: admitted 02/27/21 and discharged 03/01/21 but was back in hospital on 11/17 and dx with pneumonia. Initial hospital dx was seizure. Patient was found to have brain mass on imaging and underwent craniotomy on 11/14. She was sent home initially on 11/9 but had seizures and was readmitted.   Not feeling too well. Sleeping still not well. Coughing a lot through the night. Cough is productive. Breathing is feeling "good" in terms of getting air in and out.   She doesn't remember what happened to her prior to hospitalization either time. Doesn't remember going or coming home. Very stressful for her.   Hasn't had physical therapy; hasn't been able to do it. Just getting very emotional. Appetite is "too good". Having meals sent to her. Gets one of those prepared meals/day. Then eating on her own. Considering starting ensure. Husband making sure getting some healthy items.   Daughter here with her now. Anxious to see oncologist. Good and bad moments through day - emotional and physical. Trying to help her with not being overwhelmed with things like bills, christmas.   Hasn't felt like having nursing there yet. Daughter spoke with OT and she suggested she just rest at this point.   Currently wearing heart monitor. Just had stitches removed from head yesterday. She hasn't noted racing heart. Doing sponge baths right now at home. Has shower chair at home. Has devices for in shower. Using walker inside house.   Mouth has thrush. Has been there for at least a week. Mouth getting really dry.    Allergies  Allergen Reactions   Lamotrigine Swelling    Tongue swelling   Bupropion Other (See Comments)    Unknown reaction   Carbamazepine Other (See Comments)    Hypnatremia   Levetiracetam Other (See  Comments)    dizziness   Tramadol Other (See Comments)    Unknown reaction   Adhesive [Tape] Other (See Comments)    ?  Blister after knee surgery   Amitriptyline Nausea And Vomiting   Clarithromycin Other (See Comments)    Unknown reaction     Doxycycline Other (See Comments)    Interfered with seizure medication   Nickel Other (See Comments)    Had to remove knee implant with nickel and replace it   Other Other (See Comments)    Allergic to Metal and perfumes (unknown reaction)   Topamax [Topiramate] Nausea And Vomiting   Estradiol Rash   Latex Rash   Phenytoin Sodium Extended Other (See Comments)    drowsiness   Current Meds  Medication Sig   albuterol (VENTOLIN HFA) 108 (90 Base) MCG/ACT inhaler Inhale 1-2 puffs into the lungs every 6 (six) hours as needed for wheezing or shortness of breath.   amoxicillin-clavulanate (AUGMENTIN) 875-125 MG tablet Take 1 tablet by mouth 2 (two) times daily. One po bid x 7 days   azithromycin (ZITHROMAX) 250 MG tablet Take 1 tablet (250 mg total) by mouth daily.   benzonatate (TESSALON) 100 MG capsule Take 1 capsule (100 mg total) by mouth every 8 (eight) hours.   Budeson-Glycopyrrol-Formoterol (BREZTRI AEROSPHERE) 160-9-4.8 MCG/ACT AERO Inhale 2 puffs into the lungs 2 (two) times daily.   busPIRone (BUSPAR) 10 MG tablet TAKE 1 TABLET BY MOUTH TWICE A DAY (Patient taking differently: Take 10 mg by mouth  2 (two) times daily.)   cholecalciferol (VITAMIN D3) 25 MCG (1000 UNIT) tablet Take 1,000 Units by mouth every morning.   dexamethasone (DECADRON) 4 MG tablet Take 0.5 tablets (2 mg total) by mouth 2 (two) times daily.   EPINEPHrine 0.3 mg/0.3 mL IJ SOAJ injection Inject 0.3 mg into the muscle once as needed for anaphylaxis.   esomeprazole (NEXIUM) 20 MG capsule Take 2 capsules (40 mg total) by mouth daily before breakfast. (Patient taking differently: Take 20 mg by mouth daily before breakfast.)   ferrous sulfate 325 (65 FE) MG tablet Take 325  mg by mouth 3 (three) times a week.   furosemide (LASIX) 20 MG tablet Take 1 tablet (20 mg total) by mouth every morning.   HYDROcodone bit-homatropine (HYCODAN) 5-1.5 MG/5ML syrup Take 5 mLs by mouth every 4 (four) hours as needed for cough.   HYDROcodone-acetaminophen (NORCO/VICODIN) 5-325 MG tablet Take 1 tablet by mouth every 8 (eight) hours as needed for moderate pain.   levETIRAcetam (KEPPRA) 500 MG tablet Take 1 tablet (500 mg total) by mouth 2 (two) times daily.   levocetirizine (XYZAL) 5 MG tablet Take 5 mg by mouth at bedtime.   levothyroxine (SYNTHROID) 88 MCG tablet Take 1 tablet (88 mcg total) by mouth daily. (Patient taking differently: Take 88 mcg by mouth daily before breakfast.)   lisinopril (ZESTRIL) 5 MG tablet TAKE 1 TABLET BY MOUTH EVERY DAY (Patient taking differently: Take 5 mg by mouth every morning.)   nystatin (MYCOSTATIN) 100000 UNIT/ML suspension Take 5 mLs (500,000 Units total) by mouth 4 (four) times daily for 7 days.   ondansetron (ZOFRAN ODT) 4 MG disintegrating tablet Take 1 tablet (4 mg total) by mouth every 6 (six) hours as needed for nausea or vomiting.   pravastatin (PRAVACHOL) 20 MG tablet TAKE 1 TABLET (20 MG TOTAL) BY MOUTH DAILY. (Patient taking differently: Take 20 mg by mouth at bedtime.)   sertraline (ZOLOFT) 100 MG tablet Take 2 tablets (200 mg total) by mouth daily. (Patient taking differently: Take 100 mg by mouth 2 (two) times daily.)   solifenacin (VESICARE) 10 MG tablet Take 10 mg by mouth at bedtime.   Spacer/Aero-Holding Chambers (AEROCHAMBER PLUS) inhaler Use as instructed   vitamin B-12 (CYANOCOBALAMIN) 1000 MCG tablet Take 1,000 mcg by mouth every morning.   zonisamide (ZONEGRAN) 100 MG capsule Take 2 capsules twice a day (Patient taking differently: Take 200 mg by mouth 2 (two) times daily. Seizure medication)    Review of Systems  Constitutional:  Positive for fatigue. Negative for chills and fever.  HENT:  Negative for congestion.    Respiratory:  Positive for cough, shortness of breath and wheezing. Negative for chest tightness.   Cardiovascular:  Negative for chest pain, palpitations and leg swelling.  Gastrointestinal:  Positive for nausea and vomiting (occasional). Abdominal pain: upset stomach. Neurological:  Positive for headaches (intermittently).  Psychiatric/Behavioral:  Positive for confusion (she doesn't remember hospitalizations, and this really frustrates her). The patient is nervous/anxious.    Objective:  BP (!) 122/58 (BP Location: Left Arm, Patient Position: Sitting, Cuff Size: Large)   Pulse 83   Temp 98.9 F (37.2 C) (Axillary)   Ht 5\' 5"  (1.651 m)   SpO2 96%   BMI 38.19 kg/m       BP Readings from Last 3 Encounters:  03/13/21 (!) 122/58  03/07/21 (!) 120/48  03/01/21 (!) 112/45   Wt Readings from Last 3 Encounters:  03/07/21 229 lb 8 oz (104.1 kg)  02/22/21 229  lb 8 oz (104.1 kg)  02/16/21 229 lb 8 oz (104.1 kg)    Physical Exam Constitutional:      General: She is not in acute distress.    Appearance: She is well-developed. She is ill-appearing.  Cardiovascular:     Rate and Rhythm: Normal rate and regular rhythm.     Heart sounds: Normal heart sounds. No murmur heard.   No friction rub.  Pulmonary:     Effort: Pulmonary effort is normal. No respiratory distress.     Breath sounds: Decreased breath sounds (improved after duoneb) and rales (mild basilar) present. No wheezing (resolved LUL wheeze after duoneb).  Musculoskeletal:     Right lower leg: No edema.     Left lower leg: No edema.  Neurological:     Mental Status: She is alert and oriented to person, place, and time.  Psychiatric:        Behavior: Behavior normal.    Assessment/Plan  1. Cough, unspecified type Encouraged her to use albuterol inhaler with spacer at least BID through next week to help with cough/wheezing. She is feeling better at this point; let us know if any worsening of symptoms.  - albuterol  (PROVENTIL) (2.5 MG/3ML) 0.083% nebulizer solution 2.5 mg  2. Essential hypertension Stable. Continue to monitor (she has home nursing doing this now)  3. S/P craniotomy She is recovering from surgery. Headaches are improving. She is still weak. She will start pt/ot next week. See below.   4. Glioma of brain [facility]) She has follow up with oncology next week and will discuss management plan at that time. She is feeling overwhelmed with everything currently, so we discussed simplifying things for her. At this point daughter will talk with home nurse to see if she can be the point person for home care services/therapy so that [place] isn't getting so many calls. Discussed that she doesn't have to answer the phone for everyone. If important, message will be left and her husband/daughter/home nurse can help her sort through and prioritize call backs so this stress is not all on her. This should also help with her anxiety overal.   5. Generalized anxiety disorder See above. She thinks she will feel better if less emphasis on her to be worrying about follow ups.   6. Major depressive disorder, recurrent episode, moderate ([place]) She does follow with therapist regularly. Recent hospitalizations have been difficult for her. She has good family support with husband, daughter. Encouraged her to reach out if she needs anything.   Return if symptoms worsen or fail to improve.    Micheline Rough, MD

## 2021-03-18 ENCOUNTER — Other Ambulatory Visit: Payer: Self-pay

## 2021-03-18 ENCOUNTER — Inpatient Hospital Stay: Payer: Medicare HMO | Attending: Internal Medicine | Admitting: Internal Medicine

## 2021-03-18 VITALS — BP 142/67 | HR 81 | Temp 97.9°F | Resp 18 | Wt 234.2 lb

## 2021-03-18 DIAGNOSIS — C719 Malignant neoplasm of brain, unspecified: Secondary | ICD-10-CM

## 2021-03-18 DIAGNOSIS — Z79899 Other long term (current) drug therapy: Secondary | ICD-10-CM | POA: Diagnosis not present

## 2021-03-18 DIAGNOSIS — C713 Malignant neoplasm of parietal lobe: Secondary | ICD-10-CM | POA: Insufficient documentation

## 2021-03-18 DIAGNOSIS — Z87891 Personal history of nicotine dependence: Secondary | ICD-10-CM | POA: Diagnosis not present

## 2021-03-18 DIAGNOSIS — Z7189 Other specified counseling: Secondary | ICD-10-CM

## 2021-03-18 DIAGNOSIS — G40409 Other generalized epilepsy and epileptic syndromes, not intractable, without status epilepticus: Secondary | ICD-10-CM | POA: Insufficient documentation

## 2021-03-18 DIAGNOSIS — G40909 Epilepsy, unspecified, not intractable, without status epilepticus: Secondary | ICD-10-CM

## 2021-03-18 MED ORDER — DEXAMETHASONE 4 MG PO TABS: 2.0000 mg | ORAL_TABLET | Freq: Every day | ORAL | 0 refills | Status: DC

## 2021-03-18 NOTE — Progress Notes (Signed)
[facility] [place], [place] [address] [phone]   New Patient Evaluation  Date of Service: 03/18/21 Patient Name: Beverly Ballard Patient MRN: [id number] Patient DOB: 11-Mar-1946 Provider: Ventura Sellers, MD  Identifying Statement:  Beverly Ballard is a 75 y.o. female with left parietal  high grade glioma, IDH-1 wild type  who presents for initial consultation and evaluation.    Referring Provider: Caren Macadam, MD [place],  [place] [address]  Oncologic History: Oncology History  High grade glioma not classifiable by WHO criteria ([place])  02/17/2021 Initial Diagnosis   High grade glioma not classifiable by WHO criteria ([place])   02/22/2021 Cancer Staging   Staging form: Brain and Spinal Cord, AJCC 8th Edition - Pathologic stage from 02/22/2021: WHO Grade IV - Signed by Ventura Sellers, MD on 03/18/2021 Stage prefix: Initial diagnosis Histologic grading system: 4 grade system Extent of surgical resection: Subtotal resection Solitary (s) or multifocal (m) tumors in the primary site: Solitary Tumor location in brain: Eloquent brain area Karnofsky performance status: Score 80 Seizures at presentation: Present Duration of symptoms before diagnosis: Short IDH1 mutation: Negative      Biomarkers:  MGMT Unknown.  IDH 1/2 Wild type.  EGFR Unknown  TERT Unknown   History of Present Illness: The patient's records from the referring physician were obtained and reviewed and the patient interviewed to confirm this HPI.  Beverly Ballard presented to medical attention at the end of October 2022 with new type of seizure event, described as "shaking all over, lost consciousness".  She carries history of epilepsy going back to childhood, had been stable on Zonisamide for several years.  CNS imgaging demonstrated L parietal mass.  She underwent craniotomy and debulking resection with Dr. Ronnald Ramp on 02/22/21; path  demonstrated high grade glioma, IDH-1 wt.  Since surgery she has been rehospitalized for further breakthrough seizures, now on both Keppra and Zonegran.  She was also recently treated for recurrent pneumonia with oral antibiotics.  At present, she feels very impaired with regards to cognition, comprehension of language, and balance.  She is ambulating with a walker or wheelchair, also limited by long standing orthopedic issues.  She has recurrent nausea and shortness of breath which are both chronic issues.  Lives at home with her husband who also has cancer.  Medications: Current Outpatient Medications on File Prior to Visit  Medication Sig Dispense Refill   albuterol (VENTOLIN HFA) 108 (90 Base) MCG/ACT inhaler Inhale 1-2 puffs into the lungs every 6 (six) hours as needed for wheezing or shortness of breath. 8 g 0   amoxicillin-clavulanate (AUGMENTIN) 875-125 MG tablet Take 1 tablet by mouth 2 (two) times daily. One po bid x 7 days 14 tablet 0   azithromycin (ZITHROMAX) 250 MG tablet Take 1 tablet (250 mg total) by mouth daily. 4 tablet 0   benzonatate (TESSALON) 100 MG capsule Take 1 capsule (100 mg total) by mouth every 8 (eight) hours. 21 capsule 0   Budeson-Glycopyrrol-Formoterol (BREZTRI AEROSPHERE) 160-9-4.8 MCG/ACT AERO Inhale 2 puffs into the lungs 2 (two) times daily. 5.9 g 0   busPIRone (BUSPAR) 10 MG tablet TAKE 1 TABLET BY MOUTH TWICE A DAY (Patient taking differently: Take 10 mg by mouth 2 (two) times daily.) 180 tablet 0   cholecalciferol (VITAMIN D3) 25 MCG (1000 UNIT) tablet Take 1,000 Units by mouth every morning.     dexamethasone (DECADRON) 4 MG tablet Take 0.5  tablets (2 mg total) by mouth 2 (two) times daily. 30 tablet 0   EPINEPHrine 0.3 mg/0.3 mL IJ SOAJ injection Inject 0.3 mg into the muscle once as needed for anaphylaxis.     esomeprazole (NEXIUM) 20 MG capsule Take 2 capsules (40 mg total) by mouth daily before breakfast. (Patient taking differently: Take 20 mg by mouth  daily before breakfast.) 2 capsule 0   ferrous sulfate 325 (65 FE) MG tablet Take 325 mg by mouth 3 (three) times a week.     furosemide (LASIX) 20 MG tablet Take 1 tablet (20 mg total) by mouth every morning. 90 tablet 1   HYDROcodone bit-homatropine (HYCODAN) 5-1.5 MG/5ML syrup Take 5 mLs by mouth every 4 (four) hours as needed for cough. 120 mL 0   HYDROcodone-acetaminophen (NORCO/VICODIN) 5-325 MG tablet Take 1 tablet by mouth every 8 (eight) hours as needed for moderate pain. 20 tablet 0   levETIRAcetam (KEPPRA) 500 MG tablet Take 1 tablet (500 mg total) by mouth 2 (two) times daily. 60 tablet 2   levocetirizine (XYZAL) 5 MG tablet Take 5 mg by mouth at bedtime.     levothyroxine (SYNTHROID) 88 MCG tablet Take 1 tablet (88 mcg total) by mouth daily. (Patient taking differently: Take 88 mcg by mouth daily before breakfast.) 90 tablet 3   lisinopril (ZESTRIL) 5 MG tablet TAKE 1 TABLET BY MOUTH EVERY DAY (Patient taking differently: Take 5 mg by mouth every morning.) 90 tablet 1   nystatin (MYCOSTATIN) 100000 UNIT/ML suspension Take 5 mLs (500,000 Units total) by mouth 4 (four) times daily for 7 days. 180 mL 1   ondansetron (ZOFRAN ODT) 4 MG disintegrating tablet Take 1 tablet (4 mg total) by mouth every 6 (six) hours as needed for nausea or vomiting. 90 tablet 1   pravastatin (PRAVACHOL) 20 MG tablet TAKE 1 TABLET (20 MG TOTAL) BY MOUTH DAILY. (Patient taking differently: Take 20 mg by mouth at bedtime.) 90 tablet 1   sertraline (ZOLOFT) 100 MG tablet Take 2 tablets (200 mg total) by mouth daily. (Patient taking differently: Take 100 mg by mouth 2 (two) times daily.) 180 tablet 1   solifenacin (VESICARE) 10 MG tablet Take 10 mg by mouth at bedtime.     Spacer/Aero-Holding Chambers (AEROCHAMBER PLUS) inhaler Use as instructed 1 each 0   vitamin B-12 (CYANOCOBALAMIN) 1000 MCG tablet Take 1,000 mcg by mouth every morning.     zonisamide (ZONEGRAN) 100 MG capsule Take 2 capsules twice a day (Patient  taking differently: Take 200 mg by mouth 2 (two) times daily. Seizure medication) 360 capsule 3   No current facility-administered medications on file prior to visit.    Allergies:  Allergies  Allergen Reactions   Lamotrigine Swelling    Tongue swelling   Bupropion Other (See Comments)    Unknown reaction   Carbamazepine Other (See Comments)    Hypnatremia   Levetiracetam Other (See Comments)    dizziness   Tramadol Other (See Comments)    Unknown reaction   Adhesive [Tape] Other (See Comments)    ?  Blister after knee surgery   Amitriptyline Nausea And Vomiting   Clarithromycin Other (See Comments)    Unknown reaction     Doxycycline Other (See Comments)    Interfered with seizure medication   Nickel Other (See Comments)    Had to remove knee implant with nickel and replace it   Other Other (See Comments)    Allergic to Metal and perfumes (unknown reaction)  Topamax [Topiramate] Nausea And Vomiting   Estradiol Rash   Latex Rash   Phenytoin Sodium Extended Other (See Comments)    drowsiness   Past Medical History:  Past Medical History:  Diagnosis Date   Allergy    Anemia    Anxiety and depression 12/19/2009   Arthritis    Cancer ([facility])    vaginal   Degenerative disorder of eye    Diverticulosis    Dysrhythmia 01/2021   Atrial fib/flutter   Essential hypertension, benign 06/26/2009   GERD 05/27/2007   takes Nexium daily   History of gout    History of migraine many yrs ago   Hyperlipidemia    takes Pravastatin daily   Hypothyroidism    Insomnia    but doesn't take any meds   LOW BACK PAIN 05/27/2007   Osteoporosis    Seizures ([place])    last 1983, none since then- last seizure 1988 per pt    Sleep apnea    wears CPAP   Past Surgical History:  Past Surgical History:  Procedure Laterality Date   ADENOIDECTOMY     APPLICATION OF CRANIAL NAVIGATION N/A 02/22/2021   Procedure: APPLICATION OF CRANIAL NAVIGATION;  Surgeon: Eustace Moore, MD;  Location:  [facility];  Service: Neurosurgery;  Laterality: N/A;   BACK SURGERY     lumbar x2   BRONCHIAL BIOPSY  09/12/2019   Procedure: BRONCHIAL BIOPSIES;  Surgeon: Laurin Coder, MD;  Location: [facility];  Service: Endoscopy;;   BRONCHIAL WASHINGS  09/12/2019   Procedure: BRONCHIAL WASHINGS;  Surgeon: Laurin Coder, MD;  Location: [facility];  Service: Endoscopy;;   COLONOSCOPY  2009   CRANIOTOMY N/A 02/22/2021   Procedure: CRANIOTOMY FOR TUMOR EXCISION;  Surgeon: Eustace Moore, MD;  Location: [facility];  Service: Neurosurgery;  Laterality: N/A;   ESOPHAGOGASTRODUODENOSCOPY     Heel spurs Bilateral    HEMOSTASIS CONTROL  09/12/2019   Procedure: HEMOSTASIS CONTROL;  Surgeon: Laurin Coder, MD;  Location: [facility];  Service: Endoscopy;;  epi   KNEE ARTHROPLASTY Left 11/07/2013   Procedure: COMPUTER ASSISTED TOTAL KNEE ARTHROPLASTY;  Surgeon: Marybelle Killings, MD;  Location: [facility];  Service: Orthopedics;  Laterality: Left;  Left Total Knee Arthroplasty, Cemented, Computer Assist   TONSILLECTOMY     TOTAL KNEE REVISION Left 09/19/2014   Procedure: LEFT TOTAL KNEE REVISION;  Surgeon: Leandrew Koyanagi, MD;  Location: [facility];  Service: Orthopedics;  Laterality: Left;   UPPER GASTROINTESTINAL ENDOSCOPY     VIDEO BRONCHOSCOPY N/A 09/12/2019   Procedure: VIDEO BRONCHOSCOPY WITH FLUORO;  Surgeon: Laurin Coder, MD;  Location: [facility];  Service: Endoscopy;  Laterality: N/A;   Social History:  Social History   Socioeconomic History   Marital status: Married    Spouse name: Not on file   Number of children: 1   Years of education: Not on file   Highest education level: 12th grade  Occupational History   Occupation: Retired  Tobacco Use   Smoking status: Former    Packs/day: 0.25    Years: 20.00    Pack years: 5.00    Types: Cigarettes    Quit date: 09/08/1978    Years since quitting: 42.5   Smokeless tobacco: Never   Tobacco comments:    quit smoking 70yr ago  Vaping Use   Vaping  Use: Never used  Substance and Sexual Activity   Alcohol use: No   Drug use: No   Sexual activity: Never  Birth control/protection: Post-menopausal  Other Topics Concern   Not on file  Social History Narrative   Right handed    Lives with husband    Social Determinants of Health   Financial Resource Strain: Low Risk    Difficulty of Paying Living Expenses: Not very hard  Food Insecurity: Unknown   Worried About Charity fundraiser in the Last Year: Patient refused   Arboriculturist in the Last Year: Never true  Transportation Needs: No Transportation Needs   Lack of Transportation (Medical): No   Lack of Transportation (Non-Medical): No  Physical Activity: Unknown   Days of Exercise per Week: 1 day   Minutes of Exercise per Session: Patient refused  Stress: Stress Concern Present   Feeling of Stress : Very much  Social Connections: Moderately Isolated   Frequency of Communication with Friends and Family: Three times a week   Frequency of Social Gatherings with Friends and Family: Three times a week   Attends Religious Services: Never   Active Member of Clubs or Organizations: No   Attends Archivist Meetings: Never   Marital Status: Married  Human resources officer Violence: Not At Risk   Fear of Current or Ex-Partner: No   Emotionally Abused: No   Physically Abused: No   Sexually Abused: No   Family History:  Family History  Problem Relation Age of Onset   Aortic dissection Father    Arthritis Mother    Stroke Mother 98   Heart murmur Other    Throat cancer Sister    Colon cancer Neg Hx    Esophageal cancer Neg Hx    Stomach cancer Neg Hx    Rectal cancer Neg Hx    Colon polyps Neg Hx     Review of Systems: Constitutional: Doesn't report fevers, chills or abnormal weight loss Eyes: Doesn't report blurriness of vision Ears, nose, mouth, throat, and face: Doesn't report sore throat Respiratory: Doesn't report cough, dyspnea or wheezes Cardiovascular:  Doesn't report palpitation, chest discomfort  Gastrointestinal:  Doesn't report nausea, constipation, diarrhea GU: Doesn't report incontinence Skin: Doesn't report skin rashes Neurological: Per HPI Musculoskeletal: Doesn't report joint pain Behavioral/Psych: Doesn't report anxiety  Physical Exam: Vitals:   03/18/21 1005  BP: (!) 142/67  Pulse: 81  Resp: 18  Temp: 97.9 F (36.6 C)  SpO2: 93%   KPS: 70 General: Alert, cooperative, pleasant, in no acute distress Head: Normal EENT: No conjunctival injection or scleral icterus.  Lungs: Resp effort normal Cardiac: Regular rate Abdomen: Non-distended abdomen Skin: No rashes cyanosis or petechiae. Extremities: No clubbing or edema  Neurologic Exam: Mental Status: Awake, alert, attentive to examiner. Oriented to self and environment. Language is fluent with intact comprehension.  Advanced pscyhomotor slowing, mood lability noted. Cranial Nerves: Visual acuity is grossly normal. Visual fields are full. Extra-ocular movements intact. No ptosis. Face is symmetric Motor: Tone and bulk are normal. Power is full in both arms and legs. Reflexes are symmetric, no pathologic reflexes present.  Sensory: Intact to light touch Gait: Dystaxic   Labs: I have reviewed the data as listed    Component Value Date/Time   NA 141 03/07/2021 1240   NA 142 11/12/2015 0000   K 4.3 03/07/2021 1240   CL 105 03/07/2021 1240   CO2 29 03/07/2021 1240   GLUCOSE 120 (H) 03/07/2021 1240   BUN 13 03/07/2021 1240   BUN 8 11/12/2015 0000   CREATININE 0.89 03/07/2021 1240   CREATININE 0.72 03/19/2020 1301  CALCIUM 8.9 03/07/2021 1240   PROT 6.8 03/07/2021 1240   ALBUMIN 3.9 03/07/2021 1240   AST 14 (L) 03/07/2021 1240   ALT 13 03/07/2021 1240   ALKPHOS 81 03/07/2021 1240   BILITOT 0.3 03/07/2021 1240   GFRNONAA >60 03/07/2021 1240   GFRNONAA 80 10/05/2015 1105   GFRAA >60 12/20/2019 1714   GFRAA >89 10/05/2015 1105   Lab Results  Component Value  Date   WBC 14.2 (H) 03/07/2021   NEUTROABS 11.9 (H) 03/07/2021   HGB 12.9 03/07/2021   HCT 40.0 03/07/2021   MCV 97.6 03/07/2021   PLT 183 03/07/2021    Imaging: DG Chest 2 View  Result Date: 03/07/2021 CLINICAL DATA:  Cough, wheezing EXAM: CHEST - 2 VIEW COMPARISON:  02/27/2021 FINDINGS: Stable cardiomediastinal contours. Mild pulmonary vascular congestion. Small focal interstitial opacity within the periphery of the right upper lobe. No pleural effusion or pneumothorax. IMPRESSION: Small focal interstitial opacity within the periphery of the right upper lobe, suspicious for pneumonia. Electronically Signed   By: Davina Poke D.O.   On: 03/07/2021 12:43   CT Head Wo Contrast  Result Date: 02/27/2021 CLINICAL DATA:  Seizure.  Abnormal neurological exam. EXAM: CT HEAD WITHOUT CONTRAST TECHNIQUE: Contiguous axial images were obtained from the base of the skull through the vertex without intravenous contrast. COMPARISON:  02/22/2021.  02/23/2021. FINDINGS: Brain: Previous left parieto-occipital craniotomy for tumor debulking and biopsy. Edema, air and a small amount of blood products are present in the region of the surgery as expected. Regional vasogenic edema remains evident. Without contrast, residual enhancing tumor in the region previously shown cannot be assessed. No unexpected or worsening finding. The remainder the brain appears negative. No hydrocephalus or extra-axial collection. Vascular: No abnormal vascular finding. Skull: Otherwise negative. Sinuses/Orbits: Clear/normal Other: None IMPRESSION: No unexpected finding. Recent left parieto-occipital craniotomy for tumor resection/debulking. Edema, small amount of air and small amount of blood remaining evident at the surgical site as expected. No worsening or unexpected finding. Residual enhancing tumor previously shown cannot be assessed on this noncontrast exam. The remainder of the brain appears negative. Electronically Signed   By:  Nelson Chimes M.D.   On: 02/27/2021 17:33   CT HEAD WO CONTRAST (5MM)  Result Date: 02/16/2021 CLINICAL DATA:  Seizure. EXAM: CT HEAD WITHOUT CONTRAST TECHNIQUE: Contiguous axial images were obtained from the base of the skull through the vertex without intravenous contrast. COMPARISON:  March 03, 2020. FINDINGS: Brain: 20 x 15 mm rounded mass is noted in the left posterior parietal cortex with surrounding white matter edema concerning for malignancy or neoplasm. Ventricular size is within normal limits. No definite midline shift is noted. Small focus of subarachnoid hemorrhage may be present in this area. Vascular: No hyperdense vessel or unexpected calcification. Skull: Normal. Negative for fracture or focal lesion. Sinuses/Orbits: No acute finding. Other: None. IMPRESSION: 2.0 x 1.5 cm rounded mass is noted in the left posterior parietal cortex with surrounding white matter edema concerning for neoplasm or malignancy. MRI with and without gadolinium is recommended for further evaluation. Also noted is possible small focus of subarachnoid hemorrhage in the left posterior parietal region. Critical Value/emergent results were called by telephone at the time of interpretation on 02/16/2021 at 6:56 pm to provider [facility] , who verbally acknowledged these results. Electronically Signed   By: Marijo Conception M.D.   On: 02/16/2021 18:56   MR BRAIN W WO CONTRAST  Result Date: 02/23/2021 CLINICAL DATA:  Postop day 1 following left  parietal tumor resection. EXAM: MRI HEAD WITHOUT AND WITH CONTRAST TECHNIQUE: Multiplanar, multiecho pulse sequences of the brain and surrounding structures were obtained without and with intravenous contrast. CONTRAST:  36m GADAVIST GADOBUTROL 1 MMOL/ML IV SOLN COMPARISON:  02/18/2021 FINDINGS: Brain: Sequelae of interval left parietal tumor resection are identified. A resection cavity is present containing blood products. There is a small amount restricted diffusion  primarily inferior to the resection cavity measuring approximately 2.5 cm in size. There is a 1 cm ring-enhancing lesion lateral to the resection cavity in the left parietal lobe (series 18, image 28) with surrounding nonenhancing T2 hyperintensity involving cortex compatible with residual tumor. There is minimal extra-axial fluid subjacent to the craniotomy with mild dural enhancement which is likely postoperative. Mild-to-moderate edema in the left parietal lobe has mildly decreased. There is mild mass effect on the left lateral ventricle. There is no midline shift. Vascular: Major intracranial vascular flow voids are preserved. Skull and upper cervical spine: Left parietal craniotomy. Small amount of fluid in the overlying soft tissues. Skin staples. Sinuses/Orbits: Unremarkable orbits. Minimal mucosal thickening in the left maxillary sinus. Clear mastoid air cells. Other: None. IMPRESSION: 1. Postoperative changes from interval left parietal tumor resection. Evidence of some residual enhancing and nonenhancing tumor lateral to the resection cavity. 2. Mildly decreased edema. Electronically Signed   By: ALogan BoresM.D.   On: 02/23/2021 17:54   MR BRAIN W WO CONTRAST  Result Date: 02/18/2021 CLINICAL DATA:  Brain mass or lesion EXAM: MRI HEAD WITHOUT AND WITH CONTRAST TECHNIQUE: Multiplanar, multiecho pulse sequences of the brain and surrounding structures were obtained without and with intravenous contrast. CONTRAST:  126mGADAVIST GADOBUTROL 1 MMOL/ML IV SOLN COMPARISON:  02/17/2021 FINDINGS: Brain: Redemonstrated heterogeneously enhancing mass in the left parietal lobe, which measures approximately 2.4 x 1.5 x 1.9 cm (AP x TR x CC) (series 11, image 33 and series 9, image 82), with adjacent enhancing lesions measuring up to 0.9 and 0.5 cm, unchanged. Hemosiderin deposition is seen within the largest lesion, consistent hemorrhage. Unchanged surrounding edema with mild local mass effect. No acute infarct,  hydrocephalus, or midline shift. The basal cisterns are preserved. No new abnormal enhancement. Vascular: Normal flow voids. Skull and upper cervical spine: Normal marrow signal. Sinuses/Orbits: Mucosal thickening in the maxillary sinuses. The orbits are unremarkable Other: The mastoids are well aerated. IMPRESSION: Unchanged heterogeneously enhancing mass, which remains concerning for high-grade primary CNS neoplasm versus metastatic disease. Electronically Signed   By: AlMerilyn Baba.D.   On: 02/18/2021 16:44   MR Brain W and Wo Contrast  Result Date: 02/17/2021 CLINICAL DATA:  Mental status change. Seizure. Abnormal CT scan. Mass lesion. Personal history of vaginal cancer. EXAM: MRI HEAD WITHOUT AND WITH CONTRAST TECHNIQUE: Multiplanar, multiecho pulse sequences of the brain and surrounding structures were obtained without and with intravenous contrast. CONTRAST:  1035mADAVIST GADOBUTROL 1 MMOL/ML IV SOLN COMPARISON:  CT head without contrast 02/16/2022 FINDINGS: Brain: A heterogeneously enhancing mass lesion is confirmed in the left parietal lobe measuring 2.0 x 2.2 x 1.6 cm. At least 2 adjacent peripherally enhancing lesions are noted more laterally, the larger measuring 10 mm. The smaller measuring 4 mm. The secondary lesions correspond with the area more lateral hyperdensity noted on the CT scan. Extensive surrounding edema is present. No definite hemorrhage is present. There is some local mass effect with effacement of the sulci and partial effacement of the posterior horn of the left lateral ventricle. Minimal white matter disease is present otherwise  within normal limits for age. No acute infarct is present. Basal ganglia are within normal limits. The internal auditory canals are within normal limits. The brainstem and cerebellum are within normal limits. No other areas of pathologic enhancement are present. The postcontrast images are somewhat degraded by patient motion. Vascular: Flow is present  in the major intracranial arteries. Skull and upper cervical spine: The craniocervical junction is normal. Upper cervical spine is within normal limits. Marrow signal is unremarkable. Sinuses/Orbits: Small fluid level is present left maxillary sinus. There is some mucosal thickening in the anterior ethmoid air cells and inferior right frontal sinus. The paranasal sinuses and mastoid air cells are otherwise clear. The globes and orbits are within normal limits. IMPRESSION: 1. 2.0 x 2.2 x 1.6 cm heterogeneously enhancing mass lesion in the left parietal lobe with surrounding edema and local mass effect. Differential diagnosis includes high-grade primary CNS neoplasm versus metastatic disease. At least 2 adjacent lesions are present. 2. Marked surrounding vasogenic edema with mass effect as described. No midline shift. 3. Mild sinus disease as described. Electronically Signed   By: San Morelle M.D.   On: 02/17/2021 12:13   CT CHEST ABDOMEN PELVIS W CONTRAST  Result Date: 02/18/2021 CLINICAL DATA:  Metastatic disease evaluation. Witnessed seizure. History of vaginal cancer. Left parietal intracranial mass. EXAM: CT CHEST, ABDOMEN, AND PELVIS WITH CONTRAST TECHNIQUE: Multidetector CT imaging of the chest, abdomen and pelvis was performed following the standard protocol during bolus administration of intravenous contrast. CONTRAST:  112m OMNIPAQUE IOHEXOL 350 MG/ML SOLN COMPARISON:  Chest radiography 12/17/2020. CT chest 01/01/2021. CT abdomen 05/11/2020. FINDINGS: CT CHEST FINDINGS Cardiovascular: Heart size is normal. No pericardial fluid. Coronary artery calcification is present. Aortic atherosclerotic calcification is present. No pulmonary emboli are seen. Pulmonary artery remains prominent, suggesting a degree of pulmonary arterial hypertension. Mediastinum/Nodes: No mediastinal or hilar mass or lymphadenopathy. Lungs/Pleura: Redemonstration of an upper lobe predominant fibrotic pattern, unchanged  since the previous study. This could be due to atypical pulmonary fibrosis, or chronic hypersensitivity pneumonitis. There is no evidence of pulmonary metastatic disease. No pleural effusion. No collapse or pneumonia. Musculoskeletal: No evidence of osseous metastatic disease within the chest. CT ABDOMEN PELVIS FINDINGS Hepatobiliary: No liver lesion. No calcified gallstones. No ductal dilatation. Pancreas: Normal Spleen: Normal Adrenals/Urinary Tract: Adrenal glands are normal. Kidneys are normal. No cyst, mass, stone or hydronephrosis. Bladder is normal. Stomach/Bowel: Stomach and small intestine are normal. Colon is normal. Vascular/Lymphatic: Aortic atherosclerosis. No aneurysm. IVC is normal. No retroperitoneal adenopathy. Reproductive: Normal.  No pelvic mass. Other: No free fluid or air.  No hernia. Musculoskeletal: Previous discectomy and fusion procedure at L4-5. No evidence of lumbosacral metastatic disease. No pelvic bone or proximal femoral lesion. IMPRESSION: No evidence of metastatic disease or primary mass in the chest, abdomen or pelvis. Chronic pulmonary fibrosis or sensitivity pneumonitis changes as described at previous dedicated chest CT. No active process otherwise. Chronic pulmonary artery prominence suggests a degree of pulmonary arterial hypertension. Aortic atherosclerosis and coronary artery calcification. No abdominal or pelvic organ pathology seen. Aortic atherosclerotic changes. Previous L4-5 discectomy and fusion. No evidence of skeletal metastatic disease. Electronically Signed   By: MNelson ChimesM.D.   On: 02/18/2021 10:28   DG Chest Port 1 View  Result Date: 02/27/2021 CLINICAL DATA:  Difficulty breathing, seizures EXAM: PORTABLE CHEST 1 VIEW COMPARISON:  Previous studies including the radiograph done on 02/16/2021 and CT done on 02/18/2021 FINDINGS: Transverse diameter of heart is increased. Increased interstitial and alveolar densities seen  in both lungs, more so on the right  side. There is slight improvement in infiltrates in both lungs. There is poor inspiration. Left lateral CP angle is not included. Right lateral CP angle is clear. There is no pneumothorax. IMPRESSION: Cardiomegaly. Increased interstitial markings are seen in both lungs, more so on the right side suggesting scarring. There is slight improvement in aeration of parahilar regions which may be due to slightly better inspiration or suggest decrease in superimposed interstitial pneumonitis. No new focal infiltrates are seen. Electronically Signed   By: Elmer Picker M.D.   On: 02/27/2021 16:51   DG Chest Portable 1 View  Result Date: 02/16/2021 CLINICAL DATA:  Hypoxia and seizure EXAM: PORTABLE CHEST 1 VIEW COMPARISON:  11/21/2020 FINDINGS: The cardiomediastinal silhouette is unchanged. Increasing interstitial and airspace opacities bilaterally are noted. Elevated RIGHT hemidiaphragm is again noted. No pneumothorax or large pleural effusion. IMPRESSION: Increasing interstitial and airspace opacities bilaterally which may represent edema versus infection/pneumonia. Electronically Signed   By: Margarette Canada M.D.   On: 02/16/2021 18:19   EEG adult  Result Date: 02/18/2021 Lora Havens, MD     02/18/2021  1:32 PM Patient Name: Beverly Ballard MRN: [id number] Epilepsy Attending: Lora Havens Referring Physician/Provider: Dr Donnetta Simpers Date: 02/18/2021 Duration: 23.36 mins Patient history: a 75 y.o. female with PMH significant for anxiety, depression, constipation, hypertension, hypothyroidism, sleep apnea, seizures on zonisamide 100 mg twice daily who presented with 3 seizures. Found to have a enhancing lesion in left parietal lobe concerning for malignancy.  EEG to evaluate for seizures. Level of alertness: Awake, asleep AEDs during EEG study: ZNS Technical aspects: This EEG study was done with scalp electrodes positioned according to the 10-20 International system of electrode placement. Electrical  activity was acquired at a sampling rate of '500Hz'  and reviewed with a high frequency filter of '70Hz'  and a low frequency filter of '1Hz' . EEG data were recorded continuously and digitally stored. Description: The posterior dominant rhythm consists of 9-10 Hz activity of moderate voltage (25-35 uV) seen predominantly in posterior head regions, asymmetric ( left<right) and reactive to eye opening and eye closing. Sleep was characterized by vertex waves, sleep spindles (12 to 14 Hz), maximal frontocentral region.  EEG showed near continuous left hemisphere, maximal left parietal 3 to 5 hz theta-delta slowing. One burst of generalized polyspikes was noted.  Single left centro- parietal spike was also noted. Hyperventilation and photic stimulation were not performed.   ABNORMALITY -Polyspikes, generalized -Spike, left centro- parietal region -Continuous slow, left hemisphere, maximal left parietal region IMPRESSION: This study is consistent with patient's known history of generalized epilepsy.  Additionally there is single spike in left centro-parietal region which could be part of the generalized epilepsy. However due to underlying left parietal lesion, this could also be due to independent epilepsy arising from  left centro- parietal region.  There is also evidence of cortical dysfunction in left hemisphere, maximal left parietal region likely secondary to underlying structural abnormality.  No seizures were seen during the study. Lora Havens   ECHOCARDIOGRAM COMPLETE  Result Date: 02/25/2021    ECHOCARDIOGRAM REPORT   Patient Name:   Beverly Ballard Date of Exam: 02/25/2021 Medical Rec #:  [id number]     Height:       65.0 in Accession #:    [id number]    Weight:       229.5 lb Date of Birth:  [birth year]    BSA:  2.097 m Patient Age:    68 years      BP:           93/50 mmHg Patient Gender: F             HR:           59 bpm. Exam Location:  Inpatient Procedure: 2D Echo, Cardiac Doppler, Color Doppler and  Intracardiac            Opacification Agent Indications:    Atrial fibrillation  History:        Patient has prior history of Echocardiogram examinations, most                 recent 10/18/2019. Risk Factors:Hypertension, Dyslipidemia,                 Former Smoker and Obesity. Brain tumor post removal 02/22/21.  Sonographer:    Merrie Roof RDCS Referring Phys: [place]  1. Left ventricular ejection fraction, by estimation, is 60 to 65%. The left ventricle has normal function. The left ventricle has no regional wall motion abnormalities. Left ventricular diastolic parameters are consistent with Grade II diastolic dysfunction (pseudonormalization). Elevated left ventricular end-diastolic pressure.  2. Right ventricular systolic function is normal. The right ventricular size is normal.  3. Left atrial size was mildly dilated.  4. The mitral valve is abnormal. Mild mitral valve regurgitation. No evidence of mitral stenosis.  5. Small gradient across valve but leaflets appear to open well on basal short axis views. The aortic valve is tricuspid. There is mild calcification of the aortic valve. Aortic valve regurgitation is not visualized. Mild to moderate aortic valve sclerosis/calcification is present, without any evidence of aortic stenosis.  6. The inferior vena cava is normal in size with greater than 50% respiratory variability, suggesting right atrial pressure of 3 mmHg. FINDINGS  Left Ventricle: Left ventricular ejection fraction, by estimation, is 60 to 65%. The left ventricle has normal function. The left ventricle has no regional wall motion abnormalities. The left ventricular internal cavity size was normal in size. There is  no left ventricular hypertrophy. Left ventricular diastolic parameters are consistent with Grade II diastolic dysfunction (pseudonormalization). Elevated left ventricular end-diastolic pressure. Right Ventricle: The right ventricular size is normal. No increase in  right ventricular wall thickness. Right ventricular systolic function is normal. Left Atrium: Left atrial size was mildly dilated. Right Atrium: Right atrial size was normal in size. Pericardium: There is no evidence of pericardial effusion. Mitral Valve: The mitral valve is abnormal. There is mild thickening of the mitral valve leaflet(s). There is mild calcification of the mitral valve leaflet(s). Mild mitral valve regurgitation. No evidence of mitral valve stenosis. Tricuspid Valve: The tricuspid valve is normal in structure. Tricuspid valve regurgitation is not demonstrated. No evidence of tricuspid stenosis. Aortic Valve: Small gradient across valve but leaflets appear to open well on basal short axis views. The aortic valve is tricuspid. There is mild calcification of the aortic valve. Aortic valve regurgitation is not visualized. Mild to moderate aortic valve sclerosis/calcification is present, without any evidence of aortic stenosis. Aortic valve mean gradient measures 5.0 mmHg. Aortic valve peak gradient measures 10.0 mmHg. Aortic valve area, by VTI measures 2.03 cm. Pulmonic Valve: The pulmonic valve was normal in structure. Pulmonic valve regurgitation is not visualized. No evidence of pulmonic stenosis. Aorta: The aortic root is normal in size and structure. Venous: The inferior vena cava is normal in size with greater than  50% respiratory variability, suggesting right atrial pressure of 3 mmHg. IAS/Shunts: The interatrial septum was not well visualized.  LEFT VENTRICLE PLAX 2D LVIDd:         4.59 cm   Diastology LVIDs:         3.04 cm   LV e' medial:    5.98 cm/s LV PW:         1.15 cm   LV E/e' medial:  20.1 LV IVS:        1.07 cm   LV e' lateral:   7.62 cm/s LVOT diam:     1.90 cm   LV E/e' lateral: 15.7 LV SV:         74 LV SV Index:   35 LVOT Area:     2.84 cm  RIGHT VENTRICLE RV Basal diam:  3.86 cm LEFT ATRIUM           Index LA diam:      4.10 cm 1.95 cm/m LA Vol (A4C): 51.6 ml 24.60 ml/m   AORTIC VALVE AV Area (Vmax):    1.94 cm AV Area (Vmean):   1.94 cm AV Area (VTI):     2.03 cm AV Vmax:           158.00 cm/s AV Vmean:          101.000 cm/s AV VTI:            0.364 m AV Peak Grad:      10.0 mmHg AV Mean Grad:      5.0 mmHg LVOT Vmax:         108.00 cm/s LVOT Vmean:        69.100 cm/s LVOT VTI:          0.260 m LVOT/AV VTI ratio: 0.71  AORTA Ao Root diam: 3.70 cm Ao Asc diam:  3.60 cm MITRAL VALVE MV Area (PHT): 3.51 cm     SHUNTS MV Decel Time: 216 msec     Systemic VTI:  0.26 m MV E velocity: 120.00 cm/s  Systemic Diam: 1.90 cm MV A velocity: 78.20 cm/s MV E/A ratio:  1.53 Jenkins Rouge MD Electronically signed by Jenkins Rouge MD Signature Date/Time: 02/25/2021/5:26:48 PM    Final    CT BRAINLAB HEAD W/O CONTRAST (1MM)  Result Date: 02/22/2021 CLINICAL DATA:  Preop, brain surgery EXAM: CT HEAD WITHOUT CONTRAST TECHNIQUE: Contiguous axial images were obtained from the base of the skull through the vertex without intravenous contrast. COMPARISON:  02/16/2021 CT and 02/18/2021 MRI head FINDINGS: Brain: Redemonstrated mass in the left parietal lobe, measuring approximately 2.1 cm (series 7, image 66), with surrounding hypodensity, likely edema. Additional adjacent enhancing lesions noted on the 02/18/2021 MRI are not appreciated the CT. No significant midline shift. The subarachnoid hemorrhage seen 02/16/2021 exam is not well appreciated on the present exam. Vascular: No hyperdense vessel. Skull: No acute fracture or suspicious lesion. Sinuses/Orbits: Mild mucosal thickening in the left maxillary sinus. Otherwise clear. The orbits are unremarkable Other: The mastoids are well aerated. IMPRESSION: Redemonstrated 2.1 cm mass in the left posterior parietal lobe with surrounding edema, concerning for primary neoplasm or metastasis. Electronically Signed   By: Merilyn Baba M.D.   On: 02/22/2021 11:17     Pathology: SURGICAL PATHOLOGY  CASE: [id number]  PATIENT: [facility]  Surgical  Pathology Report   Clinical History: Brain tumor (nt)   FINAL MICROSCOPIC DIAGNOSIS:   A. BRAIN, LEFT PARIETAL, BIOPSY:  - Infiltrating high-grade glioma, IDH-wildtype, see comment  COMMENT:   This case was sent in consultation to Dr. Moody Bruins at [facility], [place], [place].  A complete copy of the outside  pathology report is available in patient's electronic medical records.   INTRAOPERATIVE DIAGNOSIS:  A.  Left parietal tumor: "Lesional tissue present"  Intraoperative diagnosis rendered by Dr. Melina Copa at 3:42 PM on 02/22/2021.   GROSS DESCRIPTION:   Received fresh for rapid intraoperative consult are 0.8 x 0.8 x 0.3 cm  of soft tan-pink tissue.  The specimen is entirely submitted for frozen  section in 1 block.  (GRP 02/23/2021)    Assessment/Plan High grade glioma not classifiable by WHO criteria ([place])  Goals of care, counseling/discussion  Seizure disorder [facility])  We appreciate the opportunity to participate in the care of Beverly Ballard.  She presents today with clinical and radiographic syndrome consistent with IDH-wt high grade glioma.  Two histologic features were absent which precluded formal diagnosis of glioblastoma.     We had an extensive conversation with her and her husband regarding pathology, prognosis, and available treatment pathways.  She is limited by tumor location, lack of gross total resection, advanced age, and comorbidity.    We ultimately recommended proceeding with course of intensity modulated radiation therapy and concurrent daily Temozolomide.  Radiation will be administered Mon-Fri over 6 weeks, Temodar will be dosed at 75m/m2 to be given daily over 42 days.  We reviewed side effects of temodar, including fatigue, nausea/vomiting, constipation, and cytopenias.  Informed consent was verbally obtained at bedside to proceed with oral chemotherapy.  Chemotherapy should be held for the following:  ANC less than 1,000   Platelets less than 100,000  LFT or creatinine greater than 2x ULN  If clinical concerns/contraindications develop  Every 2 weeks during radiation, labs will be checked accompanied by a clinical evaluation in the brain tumor clinic.  Alternate regimens could include abbreviated (3 weeks) course of IMRT with or w/o chemo, or palliative only.  These were both discussed in comparison to standard of care.  Decadron should be decreased to 258mAM x5 days, then 75m52mM thereafter.    Should con't Zonegran 100m50mD and Keppra 500mg64m as prior.   Screening for potential clinical trials was performed and discussed using eligibility criteria for active protocols [facility], as well as national database available on [email or web address].comThe patient is not a candidate for a research protocol at this time due to no suitable study identified.   We spent twenty additional minutes teaching regarding the natural history, biology, and historical experience in the treatment of brain tumors. We then discussed in detail the current recommendations for therapy focusing on the mode of administration, mechanism of action, anticipated toxicities, and quality of life issues associated with this plan. We also provided teaching sheets for the patient to take home as an additional resource.  SheilDELFINA SCHREURS return to clinic or contact us viKoreaphone/mychart to continue goals of care discussion today.   All questions were answered. The patient knows to call the clinic with any problems, questions or concerns. No barriers to learning were detected.  The total time spent in the encounter was 60 minutes and more than 50% was on counseling and review of test results   ZachaVentura SellersMedical Director of Neuro-Oncology [facility] HospitalesleLuke8/22 10:10 AM

## 2021-03-19 ENCOUNTER — Ambulatory Visit: Payer: Medicare HMO | Admitting: Nurse Practitioner

## 2021-03-19 DIAGNOSIS — D496 Neoplasm of unspecified behavior of brain: Secondary | ICD-10-CM | POA: Diagnosis not present

## 2021-03-19 DIAGNOSIS — I480 Paroxysmal atrial fibrillation: Secondary | ICD-10-CM | POA: Diagnosis not present

## 2021-03-19 DIAGNOSIS — R69 Illness, unspecified: Secondary | ICD-10-CM | POA: Diagnosis not present

## 2021-03-19 DIAGNOSIS — Z483 Aftercare following surgery for neoplasm: Secondary | ICD-10-CM | POA: Diagnosis not present

## 2021-03-19 DIAGNOSIS — M810 Age-related osteoporosis without current pathological fracture: Secondary | ICD-10-CM | POA: Diagnosis not present

## 2021-03-19 DIAGNOSIS — J479 Bronchiectasis, uncomplicated: Secondary | ICD-10-CM | POA: Diagnosis not present

## 2021-03-19 DIAGNOSIS — G40409 Other generalized epilepsy and epileptic syndromes, not intractable, without status epilepticus: Secondary | ICD-10-CM | POA: Diagnosis not present

## 2021-03-19 DIAGNOSIS — I4892 Unspecified atrial flutter: Secondary | ICD-10-CM | POA: Diagnosis not present

## 2021-03-19 DIAGNOSIS — I119 Hypertensive heart disease without heart failure: Secondary | ICD-10-CM | POA: Diagnosis not present

## 2021-03-20 ENCOUNTER — Telehealth: Payer: Self-pay

## 2021-03-20 NOTE — Telephone Encounter (Signed)
Message received from pt's husband, [name], stating pt wants to do a 3 week radiation plan.

## 2021-03-21 ENCOUNTER — Other Ambulatory Visit: Payer: Self-pay | Admitting: Radiation Therapy

## 2021-03-21 ENCOUNTER — Encounter: Payer: Self-pay | Admitting: Cardiology

## 2021-03-21 DIAGNOSIS — D496 Neoplasm of unspecified behavior of brain: Secondary | ICD-10-CM | POA: Diagnosis not present

## 2021-03-21 DIAGNOSIS — Z483 Aftercare following surgery for neoplasm: Secondary | ICD-10-CM | POA: Diagnosis not present

## 2021-03-21 DIAGNOSIS — G40409 Other generalized epilepsy and epileptic syndromes, not intractable, without status epilepticus: Secondary | ICD-10-CM | POA: Diagnosis not present

## 2021-03-21 DIAGNOSIS — J479 Bronchiectasis, uncomplicated: Secondary | ICD-10-CM | POA: Diagnosis not present

## 2021-03-21 DIAGNOSIS — I480 Paroxysmal atrial fibrillation: Secondary | ICD-10-CM | POA: Diagnosis not present

## 2021-03-21 DIAGNOSIS — C719 Malignant neoplasm of brain, unspecified: Secondary | ICD-10-CM

## 2021-03-21 DIAGNOSIS — I119 Hypertensive heart disease without heart failure: Secondary | ICD-10-CM | POA: Diagnosis not present

## 2021-03-21 DIAGNOSIS — M810 Age-related osteoporosis without current pathological fracture: Secondary | ICD-10-CM | POA: Diagnosis not present

## 2021-03-21 DIAGNOSIS — R69 Illness, unspecified: Secondary | ICD-10-CM | POA: Diagnosis not present

## 2021-03-21 DIAGNOSIS — I4892 Unspecified atrial flutter: Secondary | ICD-10-CM | POA: Diagnosis not present

## 2021-03-22 DIAGNOSIS — R69 Illness, unspecified: Secondary | ICD-10-CM | POA: Diagnosis not present

## 2021-03-22 DIAGNOSIS — Z483 Aftercare following surgery for neoplasm: Secondary | ICD-10-CM | POA: Diagnosis not present

## 2021-03-22 DIAGNOSIS — M810 Age-related osteoporosis without current pathological fracture: Secondary | ICD-10-CM | POA: Diagnosis not present

## 2021-03-22 DIAGNOSIS — I4892 Unspecified atrial flutter: Secondary | ICD-10-CM | POA: Diagnosis not present

## 2021-03-22 DIAGNOSIS — J479 Bronchiectasis, uncomplicated: Secondary | ICD-10-CM | POA: Diagnosis not present

## 2021-03-22 DIAGNOSIS — I480 Paroxysmal atrial fibrillation: Secondary | ICD-10-CM | POA: Diagnosis not present

## 2021-03-22 DIAGNOSIS — G4733 Obstructive sleep apnea (adult) (pediatric): Secondary | ICD-10-CM | POA: Diagnosis not present

## 2021-03-22 DIAGNOSIS — I119 Hypertensive heart disease without heart failure: Secondary | ICD-10-CM | POA: Diagnosis not present

## 2021-03-22 DIAGNOSIS — G40409 Other generalized epilepsy and epileptic syndromes, not intractable, without status epilepticus: Secondary | ICD-10-CM | POA: Diagnosis not present

## 2021-03-22 DIAGNOSIS — D496 Neoplasm of unspecified behavior of brain: Secondary | ICD-10-CM | POA: Diagnosis not present

## 2021-03-25 DIAGNOSIS — I4892 Unspecified atrial flutter: Secondary | ICD-10-CM | POA: Diagnosis not present

## 2021-03-28 NOTE — Progress Notes (Signed)
Location/Histology of Brain Tumor:  Left parietal high grade glioma, IDH-1 wild type  Patient presented with symptoms of:  patient presented to medical attention at the end of October 2022 with new type of seizure event, described as "shaking all over, lost consciousness".  She carries history of epilepsy going back to childhood, had been stable on Zonisamide for several years.  CNS imgaging demonstrated L parietal mass.  Past or anticipated interventions, if any, per neurosurgery:  02/22/2021 --Dr. Sherley Bounds Left posterior parietal craniotomy for resection of brain mass utilizing frameless stereotactic navigation  Past or anticipated interventions, if any, per medical oncology:  Under the care of Dr. Cecil Cobbs --03/18/2021 We had an extensive conversation with her and her husband regarding pathology, prognosis, and available treatment pathways.   She is limited by tumor location, lack of gross total resection, advanced age, and comorbidity.   We ultimately recommended proceeding with course of intensity modulated radiation therapy and concurrent daily Temozolomide.   Radiation will be administered Mon-Fri over 6 weeks, Temodar will be dosed at 67m/m2 to be given daily over 42 days.  Every 2 weeks during radiation, labs will be checked accompanied by a clinical evaluation in the brain tumor clinic. Alternate regimens could include abbreviated (3 weeks) course of IMRT with or w/o chemo, or palliative only.  These were both discussed in comparison to standard of care. Decadron should be decreased to 286mAM x5 days, then 29m23mM thereafter. Should con't Zonegran 100m6mD and Keppra 500mg53m as prior.  The patient is not a candidate for a research protocol at this time due to no suitable study identified  Dose of Decadron, if applicable:  2mg P54maily  Recent neurologic symptoms, if any:  Seizures: Patient denies since recent craniotomy  Headaches: Reports persistent headaches to the  top of her head that radiate down her neck; Manages with OTC Aleve  Nausea: Deals with chronic nausea  Dizziness/ataxia: Reports constant dizziness and feeling unsteady on her feet (husband reports improvement since surgery); Ambulates with a rollator  Difficulty with hand coordination: Patient denies Focal numbness/weakness: Reports ongoing numbness down the left leg/foot from previous back surgery. Also reports tingling down the posterior side of her left arm Visual deficits/changes: Reports issues with blurry/double vision as well as loss of peripheral vision to the lower right aspect of her right eye  Confusion/Memory deficits: Reports on-going confusion, as well as forgetting what she's trying to say in the middle of speaking  SAFETY ISSUES: Prior radiation? Reports receiving radioactive iodine ~25-30 years ago Pacemaker/ICD? No Possible current pregnancy? No--postmenopausal Is the patient on methotrexate? No  Additional Complaints / other details: Patient and husband would like help with updating Advance Directives

## 2021-03-28 NOTE — Progress Notes (Signed)
Radiation Oncology         [phone] ________________________________  Initial Outpatient Consultation  Name: Beverly Ballard MRN: [id number]  Date: 03/29/2021  DOB: 05/27/1945  QX:[id number], Steele Berg, MD  Mickeal Skinner Acey Lav, MD   REFERRING PHYSICIAN: Ventura Sellers, MD  DIAGNOSIS:    ICD-10-CM   1. Glioblastoma of parietal lobe [facility])  C71.3 Ambulatory referral to Social Work    2. High grade glioma not classifiable by WHO criteria ([place])  C71.9      Left parietal high grade glioma, IDH-1 wild type   Cancer Staging  High grade glioma not classifiable by WHO criteria [facility]) Staging form: Brain and Spinal Cord, AJCC 8th Edition - Pathologic stage from 02/22/2021: WHO Grade IV - Signed by Ventura Sellers, MD on 03/18/2021 Stage prefix: Initial diagnosis Histologic grading system: 4 grade system Extent of surgical resection: Subtotal resection Solitary (s) or multifocal (m) tumors in the primary site: Solitary Tumor location in brain: Eloquent brain area Karnofsky performance status: Score 80 Seizures at presentation: Present Duration of symptoms before diagnosis: Short IDH1 mutation: Negative   CHIEF COMPLAINT: Here to discuss management of brain cancer  HISTORY OF PRESENT ILLNESS::Beverly Ballard is a 75 y.o. female who presented to the [facility] ED on 02/16/21 with her first seizure episode in a long time (the patient has a past medical history significant for seizures). While in transport with EMS, the patient has an additional seizure episode lasting for several minutes requiring midazolam to abort the seizure. During evaluation in the ED, the patient remained somnolent and postictal on evaluation though had another seizure lasting for several minutes. Patient was noted to have generalized tonic-clonic shaking and was frothing at the mouth. Patient's O2 was noted to drop into the low 50's while seizing requiring supplemental oxygen.   CT of the head performed during ED  course on 02/16/21 revealed a 2.0 x 1.5 cm rounded mass in the left posterior parietal cortex, with surrounding white matter edema concerning for neoplasm or malignancy. Also noted was a possible small focus of subarachnoid hemorrhage in the left posterior parietal region.   MRI of the brain on 02/17/21 again demonstrated the 2.0 x 2.2 x 1.6 cm heterogeneously enhancing mass lesion in the left parietal lobe, with surrounding edema and local mass effect. Differential diagnoses included high-grade primary CNS neoplasm versus metastatic disease. At least 2 adjacent lesions were also appreciated.    CT of the chest abdomen and pelvis on 02/18/21 showed no evidence of metastatic disease or primary mass in the chest, abdomen or pelvis.   Additional MRI of the brain on 02/18/21 showed the unchanged appearance of the heterogeneously enhancing mass. Mass was again noted as concerning for high-grade primary CNS neoplasm versus metastatic disease.   Subsequently, the patient underwent Left posterior parietal craniotomy for resection of brain mass utilizing frameless stereotactic navigation on 02/22/21 under the care of Dr. Ronnald Ramp. Pathology revealed Infiltrating high-grade glioma, IDH-wildtype.   MRI of the brain on 02/23/21 showed postoperative changes from interval left parietal tumor resection. Evidence of some residual enhancing and non-enhancing tumor lateral to the resection cavity was appreciated as well. A mild decrease in edema was also appreciated.   Unfortunately, the patient again presented to the ED on 02/27/21 after being discharged that same day. When the patient arrived at home, she reported having about 4 back-to-back seizures. Upon arrival to the ED, the patient was placed on Depakote and admitted for further observation. CT of the  head taken during hospital course revealed expected/ known post-procedural findings. Neurology was consulted and the patient was switched from Depakote to [place].  The patient had no more seizure episodes and was discharged on 03/01/21.   Of note: the patient again presented to the ED on 03/07/21 with persistent cough and wheezing for 2 weeks. CXR performed on 03/07/21 showed a small focal interstitial opacity within the periphery of the right upper lobe, suspicious for pneumonia. Accordingly, the patient was treated with a dose of Rocephin and Zithromax, and sent home with Augmentin and azithromycin.   Subsequently, the patient was referred to Dr. Mickeal Skinner for outpatient care and further evaluation on 03/18/21. During evaluation, the patient was noted to feel very impaired with regards to cognition, comprehension of language, and balance.  Patient also reported ambulating with a walker or wheelchair, and recurrent nausea and shortness of breath which are both chronic issues for her. Following discussion of her treatment options, Dr. Mickeal Skinner recommended proceeding with a course of intensity modulated radiation therapy and concurrent daily Temozolomide.   Of note: the patient lives at home with her husband who also has cancer.  She notes cognitive decline and emotional distress. Chronic Lt leg weakness r/t knee surgery. Decreased right peripheral vision.  PREVIOUS RADIATION THERAPY: No  PAST MEDICAL HISTORY:  has a past medical history of Allergy, Anemia, Anxiety and depression (12/19/2009), Arthritis, Cancer ([place]), Degenerative disorder of eye, Diverticulosis, Dysrhythmia (01/2021), Essential hypertension, benign (06/26/2009), GERD (05/27/2007), History of gout, History of migraine (many yrs ago), Hyperlipidemia, Hypothyroidism, Insomnia, LOW BACK PAIN (05/27/2007), Osteoporosis, Seizures ([place]), and Sleep apnea.    PAST SURGICAL HISTORY: Past Surgical History:  Procedure Laterality Date   ADENOIDECTOMY     APPLICATION OF CRANIAL NAVIGATION N/A 02/22/2021   Procedure: APPLICATION OF CRANIAL NAVIGATION;  Surgeon: Eustace Moore, MD;  Location: [facility];  Service:  Neurosurgery;  Laterality: N/A;   BACK SURGERY     lumbar x2   BRONCHIAL BIOPSY  09/12/2019   Procedure: BRONCHIAL BIOPSIES;  Surgeon: Laurin Coder, MD;  Location: [facility];  Service: Endoscopy;;   BRONCHIAL WASHINGS  09/12/2019   Procedure: BRONCHIAL WASHINGS;  Surgeon: Laurin Coder, MD;  [facility];  Service: Endoscopy;;   COLONOSCOPY  2009   CRANIOTOMY N/A 02/22/2021   Procedure: CRANIOTOMY FOR TUMOR EXCISION;  Surgeon: Eustace Moore, MD;  Location: [facility];  Service: Neurosurgery;  Laterality: N/A;   ESOPHAGOGASTRODUODENOSCOPY     Heel spurs Bilateral    HEMOSTASIS CONTROL  09/12/2019   Procedure: HEMOSTASIS CONTROL;  Surgeon: Laurin Coder, MD;  [facility];  Service: Endoscopy;;  epi   KNEE ARTHROPLASTY Left 11/07/2013   Procedure: COMPUTER ASSISTED TOTAL KNEE ARTHROPLASTY;  Surgeon: Marybelle Killings, MD;  Location: [facility];  Service: Orthopedics;  Laterality: Left;  Left Total Knee Arthroplasty, Cemented, Computer Assist   TONSILLECTOMY     TOTAL KNEE REVISION Left 09/19/2014   Procedure: LEFT TOTAL KNEE REVISION;  Surgeon: Leandrew Koyanagi, MD;  Location: [facility];  Service: Orthopedics;  Laterality: Left;   UPPER GASTROINTESTINAL ENDOSCOPY     VIDEO BRONCHOSCOPY N/A 09/12/2019   Procedure: VIDEO BRONCHOSCOPY WITH FLUORO;  Surgeon: Laurin Coder, MD;  [facility];  Service: Endoscopy;  Laterality: N/A;    FAMILY HISTORY: family history includes Aortic dissection in her father; Arthritis in her mother; Heart murmur in an other family member; Stroke (age of onset: 15) in her mother; Throat cancer in her sister.  SOCIAL  HISTORY:  reports that she quit smoking about 42 years ago. Her smoking use included cigarettes. She has a 5.00 pack-year smoking history. She has never used smokeless tobacco. She reports that she does not drink alcohol and does not use drugs.  ALLERGIES: Lamotrigine, Bupropion, Carbamazepine, Levetiracetam, Tramadol, Adhesive  [tape], Amitriptyline, Clarithromycin, Doxycycline, Nickel, Other, Topamax [topiramate], Estradiol, Latex, and Phenytoin sodium extended  MEDICATIONS:  Current Outpatient Medications  Medication Sig Dispense Refill   albuterol (VENTOLIN HFA) 108 (90 Base) MCG/ACT inhaler Inhale 1-2 puffs into the lungs every 6 (six) hours as needed for wheezing or shortness of breath. 8 g 0   benzonatate (TESSALON) 100 MG capsule Take 1 capsule (100 mg total) by mouth every 8 (eight) hours. (Patient not taking: Reported on 03/18/2021) 21 capsule 0   Budeson-Glycopyrrol-Formoterol (BREZTRI AEROSPHERE) 160-9-4.8 MCG/ACT AERO Inhale 2 puffs into the lungs 2 (two) times daily. 5.9 g 0   busPIRone (BUSPAR) 10 MG tablet TAKE 1 TABLET BY MOUTH TWICE A DAY (Patient taking differently: Take 10 mg by mouth 2 (two) times daily.) 180 tablet 0   cholecalciferol (VITAMIN D3) 25 MCG (1000 UNIT) tablet Take 1,000 Units by mouth every morning.     dexamethasone (DECADRON) 4 MG tablet Take 0.5 tablets (2 mg total) by mouth daily. 30 tablet 0   EPINEPHrine 0.3 mg/0.3 mL IJ SOAJ injection Inject 0.3 mg into the muscle once as needed for anaphylaxis.     esomeprazole (NEXIUM) 20 MG capsule Take 2 capsules (40 mg total) by mouth daily before breakfast. (Patient taking differently: Take 20 mg by mouth daily before breakfast.) 2 capsule 0   ferrous sulfate 325 (65 FE) MG tablet Take 325 mg by mouth 3 (three) times a week.     furosemide (LASIX) 20 MG tablet Take 1 tablet (20 mg total) by mouth every morning. 90 tablet 1   HYDROcodone bit-homatropine (HYCODAN) 5-1.5 MG/5ML syrup Take 5 mLs by mouth every 4 (four) hours as needed for cough. 120 mL 0   HYDROcodone-acetaminophen (NORCO/VICODIN) 5-325 MG tablet Take 1 tablet by mouth every 8 (eight) hours as needed for moderate pain. 20 tablet 0   levETIRAcetam (KEPPRA) 500 MG tablet Take 1 tablet (500 mg total) by mouth 2 (two) times daily. 60 tablet 2   levocetirizine (XYZAL) 5 MG tablet  Take 5 mg by mouth at bedtime.     levothyroxine (SYNTHROID) 88 MCG tablet Take 1 tablet (88 mcg total) by mouth daily. (Patient taking differently: Take 88 mcg by mouth daily before breakfast.) 90 tablet 3   lisinopril (ZESTRIL) 5 MG tablet TAKE 1 TABLET BY MOUTH EVERY DAY (Patient taking differently: Take 5 mg by mouth every morning.) 90 tablet 1   ondansetron (ZOFRAN ODT) 4 MG disintegrating tablet Take 1 tablet (4 mg total) by mouth every 6 (six) hours as needed for nausea or vomiting. 90 tablet 1   pravastatin (PRAVACHOL) 20 MG tablet TAKE 1 TABLET (20 MG TOTAL) BY MOUTH DAILY. (Patient taking differently: Take 20 mg by mouth at bedtime.) 90 tablet 1   sertraline (ZOLOFT) 100 MG tablet TAKE 2 TABLETS BY MOUTH EVERY DAY 180 tablet 1   solifenacin (VESICARE) 10 MG tablet Take 10 mg by mouth at bedtime.     Spacer/Aero-Holding Chambers (AEROCHAMBER PLUS) inhaler Use as instructed 1 each 0   vitamin B-12 (CYANOCOBALAMIN) 1000 MCG tablet Take 1,000 mcg by mouth every morning.     zonisamide (ZONEGRAN) 100 MG capsule Take 2 capsules twice a day (Patient taking  differently: Take 200 mg by mouth 2 (two) times daily. Seizure medication) 360 capsule 3   No current facility-administered medications for this encounter.    REVIEW OF SYSTEMS:  Notable for that above.   PHYSICAL EXAM:  height is '5\' 5"'  (1.651 m) and weight is 233 lb 12.8 oz (106.1 kg). Her temporal temperature is 97.3 F (36.3 C) (abnormal). Her blood pressure is 155/67 (abnormal) and her pulse is 80. Her respiration is 20 and oxygen saturation is 98%.   General: Alert and in mild distress HEENT: Head is normocephalic. Extraocular movements are intact. Oropharynx is clear. Heart: Regular in rate and rhythm with no murmurs, rubs, or gallops. Chest: Clear to auscultation bilaterally, with no rhonchi, wheezes, or rales. Abdomen: Soft, nontender, nondistended, with no rigidity or guarding. Skin: No concerning lesions. Musculoskeletal: uses  a walker. Reduced strength in left leg Neurologic: no facial droop. Speech is fluent. Finger to nose testing is not intact. Right peripheral vision is not intact, particularly in RLQ. Object recall at 3 min is 1/3. Psychiatric: Affect is appropriate.  KPS = 60  100 - Normal; no complaints; no evidence of disease. 90   - Able to carry on normal activity; minor signs or symptoms of disease. 80   - Normal activity with effort; some signs or symptoms of disease. 71   - Cares for self; unable to carry on normal activity or to do active work. 60   - Requires occasional assistance, but is able to care for most of his personal needs. 50   - Requires considerable assistance and frequent medical care. 9   - Disabled; requires special care and assistance. 79   - Severely disabled; hospital admission is indicated although death not imminent. 55   - Very sick; hospital admission necessary; active supportive treatment necessary. 10   - Moribund; fatal processes progressing rapidly. 0     - Dead  Karnofsky DA, [place], [place] and [place] [phone]) The use of the nitrogen mustards in the palliative treatment of carcinoma: with particular reference to bronchogenic carcinoma Cancer 1 634-56   LABORATORY DATA:  Lab Results  Component Value Date   WBC 14.2 (H) 03/07/2021   HGB 12.9 03/07/2021   HCT 40.0 03/07/2021   MCV 97.6 03/07/2021   PLT 183 03/07/2021   CMP     Component Value Date/Time   NA 141 03/07/2021 1240   NA 142 11/12/2015 0000   K 4.3 03/07/2021 1240   CL 105 03/07/2021 1240   CO2 29 03/07/2021 1240   GLUCOSE 120 (H) 03/07/2021 1240   BUN 13 03/07/2021 1240   BUN 8 11/12/2015 0000   CREATININE 0.89 03/07/2021 1240   CREATININE 0.72 03/19/2020 1301   CALCIUM 8.9 03/07/2021 1240   PROT 6.8 03/07/2021 1240   ALBUMIN 3.9 03/07/2021 1240   AST 14 (L) 03/07/2021 1240   ALT 13 03/07/2021 1240   ALKPHOS 81 03/07/2021 1240   BILITOT 0.3 03/07/2021 1240   GFRNONAA >60  03/07/2021 1240   GFRNONAA 80 10/05/2015 1105   GFRAA >60 12/20/2019 1714   GFRAA >89 10/05/2015 1105         RADIOGRAPHY: DG Chest 2 View  Result Date: 03/07/2021 CLINICAL DATA:  Cough, wheezing EXAM: CHEST - 2 VIEW COMPARISON:  02/27/2021 FINDINGS: Stable cardiomediastinal contours. Mild pulmonary vascular congestion. Small focal interstitial opacity within the periphery of the right upper lobe. No pleural effusion or pneumothorax. IMPRESSION: Small focal interstitial opacity within the periphery of the  right upper lobe, suspicious for pneumonia. Electronically Signed   By: Davina Poke D.O.   On: 03/07/2021 12:43      IMPRESSION/PLAN: Today, I talked to the patient about the findings and work-up thus far.  We discussed the patient's diagnosis of high grade glioma and general treatment for this, highlighting the role of radiotherapy in the management.  We discussed the available radiation techniques, and focused on the details of logistics and delivery.   We discussed a 3 week hypofractionated vs 6 week standard regimen, reviewing the pros and cons of each.   Since she and her husband feel that she is progressively getting better since surgery, they would like to proceed with the standard 6 week regimen.  We discussed the risks, benefits, and side effects of radiotherapy. Side effects may include but not necessarily be limited to: skin irritation, hair loss, fatigue, headache, dizziness, neurologic decline, cognitive decline, brain injury. No guarantees of treatment were given. A consent form was signed and placed in the patient's medical record.  The patient  and patient's husband were encouraged to ask questions that I answered to the best of my ability.    We will proceed with RT planning today, and start RT in about 1 week.  Referral to SW and spiritual care for emotional and logistical support.  Note sent to med/onc to coordinate care.  On date of service, in total, I spent 60  minutes on this encounter. Patient was seen in person.   __________________________________________   Eppie Gibson, MD  This document serves as a record of services personally performed by Eppie Gibson, MD. It was created on her behalf by Roney Mans, a trained medical scribe. The creation of this record is based on the scribe's personal observations and the provider's statements to them. This document has been checked and approved by the attending provider.

## 2021-03-29 ENCOUNTER — Encounter: Payer: Self-pay | Admitting: Radiation Oncology

## 2021-03-29 ENCOUNTER — Ambulatory Visit: Payer: Medicare HMO | Admitting: Pulmonary Disease

## 2021-03-29 ENCOUNTER — Ambulatory Visit
Admission: RE | Admit: 2021-03-29 | Discharge: 2021-03-29 | Disposition: A | Discharge status: Home / Self Care | Payer: Medicare HMO | Source: Ambulatory Visit | Attending: Radiation Oncology | Admitting: Radiation Oncology

## 2021-03-29 ENCOUNTER — Other Ambulatory Visit: Payer: Self-pay

## 2021-03-29 ENCOUNTER — Other Ambulatory Visit: Payer: Self-pay | Admitting: Physician Assistant

## 2021-03-29 ENCOUNTER — Encounter: Payer: Self-pay | Admitting: General Practice

## 2021-03-29 VITALS — BP 155/67 | HR 80 | Temp 97.3°F | Resp 20 | Ht 65.0 in | Wt 233.8 lb

## 2021-03-29 DIAGNOSIS — Z87891 Personal history of nicotine dependence: Secondary | ICD-10-CM | POA: Insufficient documentation

## 2021-03-29 DIAGNOSIS — C713 Malignant neoplasm of parietal lobe: Secondary | ICD-10-CM | POA: Insufficient documentation

## 2021-03-29 DIAGNOSIS — M810 Age-related osteoporosis without current pathological fracture: Secondary | ICD-10-CM | POA: Diagnosis not present

## 2021-03-29 DIAGNOSIS — G40409 Other generalized epilepsy and epileptic syndromes, not intractable, without status epilepticus: Secondary | ICD-10-CM | POA: Diagnosis not present

## 2021-03-29 DIAGNOSIS — I4892 Unspecified atrial flutter: Secondary | ICD-10-CM | POA: Diagnosis not present

## 2021-03-29 DIAGNOSIS — C719 Malignant neoplasm of brain, unspecified: Secondary | ICD-10-CM

## 2021-03-29 DIAGNOSIS — I119 Hypertensive heart disease without heart failure: Secondary | ICD-10-CM | POA: Diagnosis not present

## 2021-03-29 DIAGNOSIS — R4189 Other symptoms and signs involving cognitive functions and awareness: Secondary | ICD-10-CM | POA: Diagnosis not present

## 2021-03-29 DIAGNOSIS — Z510 Encounter for antineoplastic radiation therapy: Secondary | ICD-10-CM | POA: Diagnosis not present

## 2021-03-29 DIAGNOSIS — K219 Gastro-esophageal reflux disease without esophagitis: Secondary | ICD-10-CM | POA: Diagnosis not present

## 2021-03-29 DIAGNOSIS — R609 Edema, unspecified: Secondary | ICD-10-CM | POA: Insufficient documentation

## 2021-03-29 DIAGNOSIS — M109 Gout, unspecified: Secondary | ICD-10-CM | POA: Insufficient documentation

## 2021-03-29 DIAGNOSIS — E039 Hypothyroidism, unspecified: Secondary | ICD-10-CM | POA: Diagnosis not present

## 2021-03-29 DIAGNOSIS — E785 Hyperlipidemia, unspecified: Secondary | ICD-10-CM | POA: Diagnosis not present

## 2021-03-29 DIAGNOSIS — I10 Essential (primary) hypertension: Secondary | ICD-10-CM | POA: Insufficient documentation

## 2021-03-29 DIAGNOSIS — J479 Bronchiectasis, uncomplicated: Secondary | ICD-10-CM | POA: Diagnosis not present

## 2021-03-29 DIAGNOSIS — D496 Neoplasm of unspecified behavior of brain: Secondary | ICD-10-CM | POA: Diagnosis not present

## 2021-03-29 DIAGNOSIS — Z79899 Other long term (current) drug therapy: Secondary | ICD-10-CM | POA: Insufficient documentation

## 2021-03-29 DIAGNOSIS — Z483 Aftercare following surgery for neoplasm: Secondary | ICD-10-CM | POA: Diagnosis not present

## 2021-03-29 DIAGNOSIS — R69 Illness, unspecified: Secondary | ICD-10-CM | POA: Diagnosis not present

## 2021-03-29 DIAGNOSIS — I480 Paroxysmal atrial fibrillation: Secondary | ICD-10-CM | POA: Diagnosis not present

## 2021-03-29 NOTE — Progress Notes (Signed)
[place] CSW Progress Notes  Referral received re need to update Advance Directives.  [place] Assistant asked to contact patient for scheduling at next available clinic.  Also provided copies of forms to [employer] RN - these can be given to patient so she can complete these and get them witnessed/notarized in the community if desired.  Edwyna Shell, LCSW Clinical Social Worker Phone:  [phone]

## 2021-03-30 ENCOUNTER — Encounter: Payer: Self-pay | Admitting: Radiation Oncology

## 2021-03-30 ENCOUNTER — Other Ambulatory Visit: Payer: Self-pay | Admitting: Physician Assistant

## 2021-04-01 ENCOUNTER — Other Ambulatory Visit (HOSPITAL_COMMUNITY): Payer: Self-pay

## 2021-04-01 ENCOUNTER — Encounter: Payer: Self-pay | Admitting: Internal Medicine

## 2021-04-01 ENCOUNTER — Telehealth: Payer: Self-pay | Admitting: Pharmacy Technician

## 2021-04-01 ENCOUNTER — Other Ambulatory Visit: Payer: Self-pay | Admitting: Internal Medicine

## 2021-04-01 ENCOUNTER — Telehealth: Payer: Self-pay | Admitting: Pharmacist

## 2021-04-01 ENCOUNTER — Other Ambulatory Visit: Payer: Medicare HMO | Admitting: *Deleted

## 2021-04-01 DIAGNOSIS — C719 Malignant neoplasm of brain, unspecified: Secondary | ICD-10-CM

## 2021-04-01 MED ORDER — ONDANSETRON HCL 8 MG PO TABS
8.0000 mg | ORAL_TABLET | Freq: Two times a day (BID) | ORAL | 1 refills | Status: DC | PRN
  Filled 2021-04-01: qty 30, 15d supply, fill #0

## 2021-04-01 MED ORDER — TEMOZOLOMIDE 20 MG PO CAPS
20.0000 mg | ORAL_CAPSULE | Freq: Every day | ORAL | 0 refills | Status: DC
  Filled 2021-04-01: qty 42, 42d supply, fill #0

## 2021-04-01 MED ORDER — TEMOZOLOMIDE 140 MG PO CAPS
140.0000 mg | ORAL_CAPSULE | Freq: Every day | ORAL | 0 refills | Status: DC
  Filled 2021-04-01: qty 45, 45d supply, fill #0

## 2021-04-01 NOTE — Progress Notes (Signed)
START ON PATHWAY REGIMEN - Neuro     One cycle, concurrent with RT:     Temozolomide   **Always confirm dose/schedule in your pharmacy ordering system**  Patient Characteristics: Glioma, Glioblastoma, IDH-wildtype, Newly Diagnosed / Treatment Naive, Good Performance Status and/or Younger Patient, MGMT Promoter Unmethylated/Unknown Disease Classification: Glioma Disease Classification: Glioblastoma, IDH-wildtype Disease Status: Newly Diagnosed / Treatment Naive Performance Status: Good Performance Status and/or Younger Patient MGMT Promoter Methylation Status: Awaiting Test Results Intent of Therapy: Non-Curative / Palliative Intent, Discussed with Patient 

## 2021-04-01 NOTE — Telephone Encounter (Addendum)
Received notification from [facility] regarding a prior authorization for  Temodar 140mg  and 20mg  . Authorization has been APPROVED from 04/01/21 to 04/01/22.   Per test claim, copay for 42 days supply is $1,011.48 for the 140mg  and $232.38 for the 20mg   Awaiting approval letter for Auth#- Will scan into chart once received.

## 2021-04-01 NOTE — Telephone Encounter (Signed)
Oral Oncology Pharmacist Encounter  Received new prescription for Temodar (temozolomide) for the treatment of high grade glioma in conjunction with radiation, planned duration 42 days.  CBC w/ Diff and CMP from 03/07/21 assessed, no relevant lab abnormalities noted. Prescription dose and frequency assessed for appropriateness. Appropriate for therapy initiation.   Current medication list in Epic reviewed, no relevant/significant DDIs with Temodar identified.  Evaluated chart and no patient barriers to medication adherence noted.   Patient agreement for treatment documented in MD note on 03/18/21.  Prescription has been e-scribed to the [facility] for benefits analysis and approval.  Oral Oncology Clinic will continue to follow for insurance authorization, copayment issues, initial counseling and start date.  Leron Croak, PharmD, BCPS Hematology/Oncology Clinical Pharmacist [facility] [phone] 04/01/2021 10:22 AM

## 2021-04-02 DIAGNOSIS — G40409 Other generalized epilepsy and epileptic syndromes, not intractable, without status epilepticus: Secondary | ICD-10-CM | POA: Diagnosis not present

## 2021-04-02 DIAGNOSIS — D496 Neoplasm of unspecified behavior of brain: Secondary | ICD-10-CM | POA: Diagnosis not present

## 2021-04-02 DIAGNOSIS — Z510 Encounter for antineoplastic radiation therapy: Secondary | ICD-10-CM | POA: Diagnosis not present

## 2021-04-02 DIAGNOSIS — J479 Bronchiectasis, uncomplicated: Secondary | ICD-10-CM | POA: Diagnosis not present

## 2021-04-02 DIAGNOSIS — Z483 Aftercare following surgery for neoplasm: Secondary | ICD-10-CM | POA: Diagnosis not present

## 2021-04-02 DIAGNOSIS — I119 Hypertensive heart disease without heart failure: Secondary | ICD-10-CM | POA: Diagnosis not present

## 2021-04-02 DIAGNOSIS — C713 Malignant neoplasm of parietal lobe: Secondary | ICD-10-CM | POA: Diagnosis not present

## 2021-04-02 DIAGNOSIS — I480 Paroxysmal atrial fibrillation: Secondary | ICD-10-CM | POA: Diagnosis not present

## 2021-04-02 DIAGNOSIS — R69 Illness, unspecified: Secondary | ICD-10-CM | POA: Diagnosis not present

## 2021-04-02 DIAGNOSIS — M810 Age-related osteoporosis without current pathological fracture: Secondary | ICD-10-CM | POA: Diagnosis not present

## 2021-04-02 DIAGNOSIS — I4892 Unspecified atrial flutter: Secondary | ICD-10-CM | POA: Diagnosis not present

## 2021-04-02 MED ORDER — TEMOZOLOMIDE 20 MG PO CAPS: 20.0000 mg | ORAL_CAPSULE | Freq: Every day | ORAL | 0 refills | Status: DC

## 2021-04-02 MED ORDER — TEMOZOLOMIDE 140 MG PO CAPS: 140.0000 mg | ORAL_CAPSULE | Freq: Every day | ORAL | 0 refills | Status: DC

## 2021-04-02 NOTE — Telephone Encounter (Signed)
Oral Oncology Pharmacist Encounter  Due to high copay, patient requested that temozolomide prescriptions for 140 mg capsules and 20 mg capsules be sent to [facility] Richarda Osmond).   Confirmed with pharmacy that they would be able to order these specific strengths of temozolomide. Called and spoke with patient's husband and recommended he call [facility] this afternoon to confirm that they do want the drugs to be ordered that way medication is available for patient to start.   Mr. And Ms. Genrich expressed appreciation and understanding. I will follow up with them tomorrow to check on status of prescription.   Leron Croak, PharmD, BCPS Hematology/Oncology Clinical Pharmacist [facility] [phone] 04/02/2021 11:05 AM

## 2021-04-03 ENCOUNTER — Encounter (HOSPITAL_COMMUNITY): Payer: Self-pay

## 2021-04-03 DIAGNOSIS — I4892 Unspecified atrial flutter: Secondary | ICD-10-CM | POA: Diagnosis not present

## 2021-04-03 DIAGNOSIS — Z483 Aftercare following surgery for neoplasm: Secondary | ICD-10-CM | POA: Diagnosis not present

## 2021-04-03 DIAGNOSIS — G40409 Other generalized epilepsy and epileptic syndromes, not intractable, without status epilepticus: Secondary | ICD-10-CM | POA: Diagnosis not present

## 2021-04-03 DIAGNOSIS — I119 Hypertensive heart disease without heart failure: Secondary | ICD-10-CM | POA: Diagnosis not present

## 2021-04-03 DIAGNOSIS — R69 Illness, unspecified: Secondary | ICD-10-CM | POA: Diagnosis not present

## 2021-04-03 DIAGNOSIS — M810 Age-related osteoporosis without current pathological fracture: Secondary | ICD-10-CM | POA: Diagnosis not present

## 2021-04-03 DIAGNOSIS — I480 Paroxysmal atrial fibrillation: Secondary | ICD-10-CM | POA: Diagnosis not present

## 2021-04-03 DIAGNOSIS — J479 Bronchiectasis, uncomplicated: Secondary | ICD-10-CM | POA: Diagnosis not present

## 2021-04-03 DIAGNOSIS — D496 Neoplasm of unspecified behavior of brain: Secondary | ICD-10-CM | POA: Diagnosis not present

## 2021-04-04 ENCOUNTER — Encounter: Payer: Self-pay | Admitting: General Practice

## 2021-04-04 NOTE — Progress Notes (Signed)
[facility] Spiritual Care Note  Referred by Dr Isidore Moos for additional layer of emotional support. Left voicemail encouraging return call.   [place], [place], [facility] Pager [phone] Voicemail [phone]

## 2021-04-04 NOTE — Telephone Encounter (Signed)
Oral Chemotherapy Pharmacist Encounter  I spoke with patient's husband, Beverly Ballard, for overview of: Temodar for the treatment of high grade glioma in conjunction with radiation, planned duration concomitant phase 42 days of therapy.   Counseled on administration, dosing, side effects, monitoring, drug-food interactions, safe handling, storage, and disposal.  Patient will take Temodar 140mg  capsules and Temodar 20mg  capsules, 160 mg total daily dose, by mouth once daily, may take at bedtime and on an empty stomach to decrease nausea and vomiting.  Patient will take Temodar concurrent with radiation for 42 days straight.  Temodar start date: 04/07/21 PM Radiation start date: 04/08/21   Patient will take Zofran 8mg  tablet, 1 tablet by mouth 30-60 min prior to Temodar dose to help decrease N/V once starting adjuvant therapy. Prophylactic Zofran will not be used at initiation of concurrent phase, but will be initiated if nausea develops despite Temodar administration on an empty stomach and at bedtime.   Adverse effects include but are not limited to: nausea, vomiting, anorexia, GI upset, rash, drug fever, and fatigue. Rare but serious adverse effects of pneumocystis pneumonia and secondary malignancy also discussed.  PCP prophylaxis will not be initiated at this time, but may be added based on lymphocyte count in the future.  Reviewed with patient importance of keeping a medication schedule and plan for any missed doses. No barriers to medication adherence identified.  Medication reconciliation performed and medication/allergy list updated.  Insurance authorization for Temodar has been obtained. Due to high copay patient is obtaining Temodar from [facility] on [address]. Patient's husband picked this up on 04/03/21.  All questions answered.  Mr. Garramone voiced understanding and appreciation.   Medication education handout placed in mail for patient. Patient and husband know to call  the office with questions or concerns. Oral Chemotherapy Clinic phone number provided.   Leron Croak, PharmD, BCPS Hematology/Oncology Clinical Pharmacist [facility] [place] [phone] 04/04/2021 11:44 AM

## 2021-04-05 ENCOUNTER — Encounter (HOSPITAL_COMMUNITY): Payer: Self-pay

## 2021-04-05 LAB — SURGICAL PATHOLOGY

## 2021-04-08 ENCOUNTER — Telehealth: Payer: Self-pay | Admitting: Pharmacist

## 2021-04-08 ENCOUNTER — Ambulatory Visit
Admission: RE | Admit: 2021-04-08 | Discharge: 2021-04-08 | Disposition: A | Discharge status: Home / Self Care | Payer: Medicare HMO | Source: Ambulatory Visit | Attending: Radiation Oncology | Admitting: Radiation Oncology

## 2021-04-08 DIAGNOSIS — C713 Malignant neoplasm of parietal lobe: Secondary | ICD-10-CM | POA: Diagnosis not present

## 2021-04-08 DIAGNOSIS — Z510 Encounter for antineoplastic radiation therapy: Secondary | ICD-10-CM | POA: Diagnosis not present

## 2021-04-08 MED ORDER — SONAFINE EX EMUL
1.0000 "application " | Freq: Two times a day (BID) | CUTANEOUS | Status: DC
  Administered 2021-04-08: 1 via TOPICAL

## 2021-04-08 NOTE — Progress Notes (Signed)
Pt here for patient teaching. Pt given Radiation and You booklet, Managing Acute Radiation Side Effects for Head and Neck Cancer handout, skin care instructions, and Sonafine.  Reviewed areas of pertinence such as fatigue, hair loss, mouth changes, nausea and vomiting, skin changes, urinary and bladder changes, headache, blurry vision, earaches, and taste changes. Pt able to give teach back of to pat skin, use unscented/gentle soap, and drink plenty of water, apply Sonafine bid, avoid applying anything to skin within 4 hours of treatment, and to use an electric razor if they must shave. Pt verbalizes understanding of information given and will contact nursing with any questions or concerns.     [email or web address]

## 2021-04-08 NOTE — Chronic Care Management (AMB) (Signed)
Chronic Care Management Pharmacy Assistant   Name: Beverly Ballard  MRN: [id number] DOB: June 20, 1945  Beverly Ballard is an 75 y.o. year old female who presents for his initial CCM visit with the clinical pharmacist.  Reason for Encounter: Chart prep for initial visit with Jeni Salles the clinical pharmacist on 04/16/2021   Conditions to be addressed/monitored: HTN, HLD, Depression, GERD, and Osteopenia, Seizures  Recent office visits:  03/13/2021 Micheline Rough MD - Patient was seen for Cough and additional issues. Started Nystatin 500,000 units 4 times daily.  Follow up if symptoms worsen or fail to improve.  02/11/2021 Micheline Rough MD - Patient was seen for need for immunization against influenza and additional issues. No medication changes. Follow up at scheduled appointment.  01/22/2021 Charlott Rakes LPN - Medicare annual wellness exam.  11/16/2020 Micheline Rough MD - Patient was seen for Wheezing and additional issues. Can try the breztri aerosphere 1 puff twice daily. Follow up in 3 months.  10/16/2020 Betty Martinique MD - Patient was seen for chronic left shoulder pain and additional issues. Started Augmentin 875/125 mg twice daily and Celebrex 100 mg twice daily as needed. Follow up in 2-3 weeks.  Recent consult visits:  03/18/2021 Cecil Cobbs MD (oncology) - Patient was seen for High grade glioma and additional issues. Decreased Dexamethasone to 2 mg daily. Discontinued Augmentin and Azithromycin. No follow up noted  02/13/2021 Donnal Moat PA-C (psychiatry) - Patient was seen for severe episode of recurrent major depressive disorder and additional issues. No medication issues. Follow up in 15 days.  01/23/2021 Donnal Moat PA-C (psychiatry) - Patient was seen for General anxiety disorder and an additional issue. Changed Buspirone to 10 mg twice daily. Follow up in 2 months.  01/08/2021 Sherrilyn Rist MD (pulmonary) - Patient was seen for shortness of breath. No  medication changes. Follow up in 3 months.  12/07/2020 Donnal Moat PA-C (psychiatry) - Patient was seen for General anxiety disorder and an additional issue. Started Buspirone 15 mg titrating up to 1 tablet twice daily. Increased Lorazepam to 1 mg every 6 hours as needed. Follow up in 6 weeks.  11/30/2020 Trona Cellar MD (GI) - Patient was seen for lower abdominal pain and additional issues. Started peppermint oil 90 mg and Sutab 1479-225-188 mg 1 kit once. No follow up noted.  11/27/2020 Naiping Xu (orthopedic) - Patient was seen for radiculopathy of cervical spine. No medication changes. Follow up if symptoms worsen or fail to improve.  11/07/2020 Ellouise Newer MD (neurology) - Patient was seen for Generalized idiopathic epilepsy and epileptic syndromes and additional issue. Discontinued Oxybutinin and Prednisone. Follow up in 6-8 months.  10/30/2020 Naiping Xu (orthopedic) - Patient was seen for chronic left shoulder pain and an additional issue. Started prednisone 5 mg as directed. Follow up after MRI.   10/10/2020 Henrine Screws MD (internal medicine) - Patient was seen for postablative hypothyroidism. No medication changes. Follow up in 1 year.  10/08/2020 Sherrilyn Rist MD (pulmonary) - Patient was seen for OSA on CPAP and an additional issue. No medication changes. Follow up in 3 months.  Hospital visits: Patient was seen at [facility] on 03/07/2021 due to Pneumonia or right lung. Discharged from [facility] after 5 hours on 03/07/2021 New?Medications Started at [facility] Discharge:?? -started Augmentin 875/125 mg twice daily X 7 days              Azithromycin 250 mg daily  Benzonatate 100 mg every 8 hours as needed              HYCODAN 5-1.5 MG/5ML syrup 5 mls every 4 hours as needed Medication Changes at Hospital Discharge: -None Medications Discontinued at Hospital Discharge: -None?  -All  other medications will remain the same.     Admitted to [facility] on 02/16/2021 due to Seizures. Discharge date was 02/18/2021. Discharged from [place]?Medications Started at [facility] Discharge:?? -started Dexamethasone 4 mg 4 times daily. Medication Changes at Hospital Discharge: -None Medications Discontinued at Hospital Discharge: -Stopped Flexeril 10 mg                  Ibuprofen 400 mg                 Lorazepam 1 mg  -All other medications will remain the same.    Medications: Outpatient Encounter Medications as of 04/08/2021  Medication Sig Note   albuterol (VENTOLIN HFA) 108 (90 Base) MCG/ACT inhaler Inhale 1-2 puffs into the lungs every 6 (six) hours as needed for wheezing or shortness of breath. 03/07/2021: prn   benzonatate (TESSALON) 100 MG capsule Take 1 capsule (100 mg total) by mouth every 8 (eight) hours. (Patient not taking: Reported on 03/18/2021)    Budeson-Glycopyrrol-Formoterol (BREZTRI AEROSPHERE) 160-9-4.8 MCG/ACT AERO Inhale 2 puffs into the lungs 2 (two) times daily.    busPIRone (BUSPAR) 10 MG tablet TAKE 1 TABLET BY MOUTH TWICE A DAY (Patient taking differently: Take 10 mg by mouth 2 (two) times daily.)    cholecalciferol (VITAMIN D3) 25 MCG (1000 UNIT) tablet Take 1,000 Units by mouth every morning.    dexamethasone (DECADRON) 4 MG tablet Take 0.5 tablets (2 mg total) by mouth daily.    EPINEPHrine 0.3 mg/0.3 mL IJ SOAJ injection Inject 0.3 mg into the muscle once as needed for anaphylaxis. 03/07/2021: prn   esomeprazole (NEXIUM) 20 MG capsule Take 2 capsules (40 mg total) by mouth daily before breakfast. (Patient taking differently: Take 20 mg by mouth daily before breakfast.) 02/18/2021: OTC   ferrous sulfate 325 (65 FE) MG tablet Take 325 mg by mouth 3 (three) times a week. 02/18/2021: No particular days of the week   furosemide (LASIX) 20 MG tablet Take 1 tablet (20 mg total) by mouth every morning.     HYDROcodone bit-homatropine (HYCODAN) 5-1.5 MG/5ML syrup Take 5 mLs by mouth every 4 (four) hours as needed for cough.    HYDROcodone-acetaminophen (NORCO/VICODIN) 5-325 MG tablet Take 1 tablet by mouth every 8 (eight) hours as needed for moderate pain.    levETIRAcetam (KEPPRA) 500 MG tablet Take 1 tablet (500 mg total) by mouth 2 (two) times daily.    levocetirizine (XYZAL) 5 MG tablet Take 5 mg by mouth at bedtime.    levothyroxine (SYNTHROID) 88 MCG tablet Take 1 tablet (88 mcg total) by mouth daily. (Patient taking differently: Take 88 mcg by mouth daily before breakfast.)    lisinopril (ZESTRIL) 5 MG tablet TAKE 1 TABLET BY MOUTH EVERY DAY (Patient taking differently: Take 5 mg by mouth every morning.)    ondansetron (ZOFRAN ODT) 4 MG disintegrating tablet Take 1 tablet (4 mg total) by mouth every 6 (six) hours as needed for nausea or vomiting. 03/07/2021: Needs refill   ondansetron (ZOFRAN) 8 MG tablet Take 1 tablet (8 mg total) by mouth 2 (two) times daily as needed (nausea and vomiting). May take 30-60 minutes prior to Temodar administration if nausea/vomiting  occurs.    pravastatin (PRAVACHOL) 20 MG tablet TAKE 1 TABLET (20 MG TOTAL) BY MOUTH DAILY. (Patient taking differently: Take 20 mg by mouth at bedtime.)    sertraline (ZOLOFT) 100 MG tablet TAKE 2 TABLETS BY MOUTH EVERY DAY    solifenacin (VESICARE) 10 MG tablet Take 10 mg by mouth at bedtime.    Spacer/Aero-Holding Chambers (AEROCHAMBER PLUS) inhaler Use as instructed    temozolomide (TEMODAR) 140 MG capsule Take 1 capsule (140 mg total) by mouth daily. (Take in addition to 20 mg capsule). May take on an empty stomach to decrease nausea & vomiting.    temozolomide (TEMODAR) 20 MG capsule Take 1 capsule (20 mg total) by mouth daily. (Take in addition to 140 mg capsule). May take on an empty stomach to decrease nausea & vomiting.    vitamin B-12 (CYANOCOBALAMIN) 1000 MCG tablet Take 1,000 mcg by mouth every morning.    zonisamide  (ZONEGRAN) 100 MG capsule Take 2 capsules twice a day (Patient taking differently: Take 200 mg by mouth 2 (two) times daily. Seizure medication)    No facility-administered encounter medications on file as of 04/08/2021.  Fill History: EPINEPHRINE 0.3 MG AUTO-INJECT 11/07/2020 30   FUROSEMIDE 20 MG TABLET 03/06/2021 90   HYDROCODONE-ACETAMIN 5-325 MG 04/03/2021 8   LORAZEPAM 1 MG TABLET 12/07/2020 15   LEVOCETIRIZINE 5 MG TABLET 02/08/2021 90   LEVOTHYROXIN 88MCG TAB 03/16/2021 90   LISINOPRIL 5 MG TABLET 01/22/2021 90   ONDANSETRON ODT 4 MG TABLET 03/06/2021 22   PRAVASTATIN SODIUM 20 MG TAB 01/22/2021 90   SERTRALINE HCL 100 MG TABLET 03/29/2021 90   SOLIFENACIN 10 MG TABLET 01/24/2021 90   TEMOZOLOMIDE 140MG CAP 04/02/2021 42   ZONISAMIDE 100 MG CAPSULE 02/08/2021 90   BUSPIRONE HCL 10 MG TABLET 02/18/2021 90   LEVETIRACETAM 500 MG TABLET 03/01/2021 90   Have you seen any other providers since your last visit? **  Any changes in your medications or health?   Any side effects from any medications?   Do you have an symptoms or problems not managed by your medications?   Any concerns about your health right now?   Has your provider asked that you check blood pressure, blood sugar, or follow special diet at home?   Do you get any type of exercise on a regular basis?   Can you think of a goal you would like to reach for your health?   Do you have any problems getting your medications?   Is there anything that you would like to discuss during the appointment?   Please bring medications and supplements to appointment  Unable to reach patient after several attempts.  Care Gaps: AWV - completed 01/22/2021 Last BP - 142/67 on 03/18/2021 Shingrix - never done Covid vaccine - overdue  Star Rating Drugs: Lisinopril 5 mg - last filled 01/22/2021 90 DS at [facility] Pravastatin 20 mg - last filled 01/22/2021 90 DS at [place] Pharmacist  Assistant [phone]

## 2021-04-09 ENCOUNTER — Other Ambulatory Visit: Payer: Self-pay

## 2021-04-09 ENCOUNTER — Ambulatory Visit
Admission: RE | Admit: 2021-04-09 | Discharge: 2021-04-09 | Disposition: A | Discharge status: Home / Self Care | Payer: Medicare HMO | Source: Ambulatory Visit | Attending: Radiation Oncology | Admitting: Radiation Oncology

## 2021-04-09 ENCOUNTER — Telehealth: Payer: Self-pay | Admitting: Family Medicine

## 2021-04-09 DIAGNOSIS — Z510 Encounter for antineoplastic radiation therapy: Secondary | ICD-10-CM | POA: Diagnosis not present

## 2021-04-09 DIAGNOSIS — C713 Malignant neoplasm of parietal lobe: Secondary | ICD-10-CM | POA: Diagnosis not present

## 2021-04-09 NOTE — Telephone Encounter (Signed)
Melissa OT with [facility] is calling and needs verbal orders to d/c pt from OT

## 2021-04-10 ENCOUNTER — Ambulatory Visit
Admission: RE | Admit: 2021-04-10 | Discharge: 2021-04-10 | Disposition: A | Discharge status: Home / Self Care | Payer: Medicare HMO | Source: Ambulatory Visit | Attending: Radiation Oncology | Admitting: Radiation Oncology

## 2021-04-10 DIAGNOSIS — C713 Malignant neoplasm of parietal lobe: Secondary | ICD-10-CM | POA: Diagnosis not present

## 2021-04-10 DIAGNOSIS — Z510 Encounter for antineoplastic radiation therapy: Secondary | ICD-10-CM | POA: Diagnosis not present

## 2021-04-10 NOTE — Telephone Encounter (Signed)
Left a detailed message with the approval below on Melissa's voicemail.

## 2021-04-10 NOTE — Telephone Encounter (Signed)
ok 

## 2021-04-11 ENCOUNTER — Other Ambulatory Visit: Payer: Self-pay

## 2021-04-11 ENCOUNTER — Telehealth: Payer: Self-pay | Admitting: Pharmacist

## 2021-04-11 ENCOUNTER — Ambulatory Visit: Payer: Medicare HMO | Admitting: Cardiology

## 2021-04-11 ENCOUNTER — Ambulatory Visit
Admission: RE | Admit: 2021-04-11 | Discharge: 2021-04-11 | Disposition: A | Discharge status: Home / Self Care | Payer: Medicare HMO | Source: Ambulatory Visit | Attending: Radiation Oncology | Admitting: Radiation Oncology

## 2021-04-11 DIAGNOSIS — C713 Malignant neoplasm of parietal lobe: Secondary | ICD-10-CM | POA: Diagnosis not present

## 2021-04-11 DIAGNOSIS — Z510 Encounter for antineoplastic radiation therapy: Secondary | ICD-10-CM | POA: Diagnosis not present

## 2021-04-11 NOTE — Chronic Care Management (AMB) (Signed)
° ° °  Chronic Care Management Pharmacy Assistant   Name: Beverly Ballard  MRN: [id number] DOB: [birth year]  04/16/2021 APPOINTMENT REMINDER   Called Beverly Ballard, No answer, left message of appointment on 04/16/2021 at 1:30 via telephone visit with Beverly Ballard, Pharm D. Notified to have all medications, supplements, blood pressure and/or blood sugar logs available during appointment and to return call if need to reschedule.  Care Gaps: AWV - completed 01/22/2021 Last BP - 142/67 on 03/18/2021 Shingrix - never done Covid vaccine - overdue  Star Rating Drug: Lisinopril 5 mg - last filled 01/22/2021 90 [facility] Pravastatin 20 mg - last filled 01/22/2021 90 [facility]  Any gaps in medications fill history? No   Beverly Ballard [facility]  Catering manager [phone]

## 2021-04-12 ENCOUNTER — Telehealth: Payer: Self-pay

## 2021-04-12 ENCOUNTER — Ambulatory Visit
Admission: RE | Admit: 2021-04-12 | Discharge: 2021-04-12 | Disposition: A | Discharge status: Home / Self Care | Payer: Medicare HMO | Source: Ambulatory Visit | Attending: Radiation Oncology | Admitting: Radiation Oncology

## 2021-04-12 DIAGNOSIS — G40409 Other generalized epilepsy and epileptic syndromes, not intractable, without status epilepticus: Secondary | ICD-10-CM | POA: Diagnosis not present

## 2021-04-12 DIAGNOSIS — C713 Malignant neoplasm of parietal lobe: Secondary | ICD-10-CM | POA: Diagnosis not present

## 2021-04-12 DIAGNOSIS — D496 Neoplasm of unspecified behavior of brain: Secondary | ICD-10-CM | POA: Diagnosis not present

## 2021-04-12 DIAGNOSIS — I119 Hypertensive heart disease without heart failure: Secondary | ICD-10-CM | POA: Diagnosis not present

## 2021-04-12 DIAGNOSIS — M810 Age-related osteoporosis without current pathological fracture: Secondary | ICD-10-CM | POA: Diagnosis not present

## 2021-04-12 DIAGNOSIS — I4892 Unspecified atrial flutter: Secondary | ICD-10-CM | POA: Diagnosis not present

## 2021-04-12 DIAGNOSIS — R69 Illness, unspecified: Secondary | ICD-10-CM | POA: Diagnosis not present

## 2021-04-12 DIAGNOSIS — I480 Paroxysmal atrial fibrillation: Secondary | ICD-10-CM | POA: Diagnosis not present

## 2021-04-12 DIAGNOSIS — Z483 Aftercare following surgery for neoplasm: Secondary | ICD-10-CM | POA: Diagnosis not present

## 2021-04-12 DIAGNOSIS — Z510 Encounter for antineoplastic radiation therapy: Secondary | ICD-10-CM | POA: Diagnosis not present

## 2021-04-12 DIAGNOSIS — J479 Bronchiectasis, uncomplicated: Secondary | ICD-10-CM | POA: Diagnosis not present

## 2021-04-12 NOTE — Telephone Encounter (Signed)
Beverly Ballard advised with verbal understanding

## 2021-04-12 NOTE — Telephone Encounter (Signed)
-----   Message from Ventura Sellers, MD sent at 04/12/2021 12:12 PM EST ----- Regarding: RE: Medication Nights before ----- Message ----- From: Rondel Baton, LPN Sent: [id number]  12:03 PM EST To: Ventura Sellers, MD Subject: Medication                                     T/C from pt's husband asking for the directions for temozolomide.  The directions are to take 140 mg along with 20 mg PO daily but he is asking if it is daily or just on nights before radiation.  Please advise.

## 2021-04-15 NOTE — Progress Notes (Deleted)
Chronic Care Management Pharmacy Note  04/15/2021 Name:  [name] MRN:  [id number] DOB:  10/30/1945  Summary: ***  Recommendations/Changes made from today's visit: ***  Plan: ***   Subjective: Beverly Ballard is an 75 y.o. year old female who is a primary patient of Koberlein, Steele Berg, MD.  The CCM team was consulted for assistance with disease management and care coordination needs.    Engaged with patient by telephone for initial visit in response to provider referral for pharmacy case management and/or care coordination services.   Consent to Services:  The patient was given the following information about Chronic Care Management services today, agreed to services, and gave verbal consent: 1. CCM service includes personalized support from designated clinical staff supervised by the primary care provider, including individualized plan of care and coordination with other care providers 2. 24/7 contact phone numbers for assistance for urgent and routine care needs. 3. Service will only be billed when office clinical staff spend 20 minutes or more in a month to coordinate care. 4. Only one practitioner may furnish and bill the service in a calendar month. 5.The patient may stop CCM services at any time (effective at the end of the month) by phone call to the office staff. 6. The patient will be responsible for cost sharing (co-pay) of up to 20% of the service fee (after annual deductible is met). Patient agreed to services and consent obtained.  Patient Care Team: Caren Macadam, MD as PCP - General (Family Medicine) Minus Breeding, MD as PCP - Cardiology (Cardiology) Minus Breeding, MD as Consulting Physician (Cardiology) Azucena Fallen, MD as Consulting Physician (Obstetrics and Gynecology) Kirtland Bouchard, PhD (Inactive) as Consulting Physician (Psychology) Beverly Ballard (Psychiatry) Armbruster, Carlota Raspberry, MD as Consulting Physician (Gastroenterology) Kerin Perna.,  MD as Referring Physician (Neurology) Cameron Sprang, MD as Consulting Physician (Neurology) Viona Gilmore, [facility] as Pharmacist (Pharmacist)  Recent office visits: 03/13/2021 Beverly Rough MD - Patient was seen for Cough and additional issues. Started Nystatin 500,000 units 4 times daily.  Follow up if symptoms worsen or fail to improve.   02/11/2021 Beverly Rough MD - Patient was seen for need for immunization against influenza and additional issues. No medication changes. Follow up at scheduled appointment.   01/22/2021 Beverly Rakes LPN - Medicare annual wellness exam.   11/16/2020 Beverly Rough MD - Patient was seen for Wheezing and additional issues. Can try the breztri aerosphere 1 puff twice daily. Follow up in 3 months.   10/16/2020 Beverly Martinique MD - Patient was seen for chronic left shoulder pain and additional issues. Started Augmentin 875/125 mg twice daily and Celebrex 100 mg twice daily as needed. Follow up in 2-3 weeks.  Recent consult visits: 03/18/2021 Beverly Cobbs MD (oncology) - Patient was seen for High grade glioma and additional issues. Decreased Dexamethasone to 2 mg daily. Discontinued Augmentin and Azithromycin. No follow up noted   02/13/2021 Beverly Moat PA-C (psychiatry) - Patient was seen for severe episode of recurrent major depressive disorder and additional issues. No medication issues. Follow up in 15 days.   01/23/2021 Beverly Moat PA-C (psychiatry) - Patient was seen for General anxiety disorder and an additional issue. Changed Buspirone to 10 mg twice daily. Follow up in 2 months.   01/08/2021 Beverly Rist MD (pulmonary) - Patient was seen for shortness of breath. No medication changes. Follow up in 3 months.   12/07/2020 Beverly Moat PA-C (psychiatry) - Patient was seen for General anxiety  disorder and an additional issue. Started Buspirone 15 mg titrating up to 1 tablet twice daily. Increased Lorazepam to 1 mg every 6 hours as needed.  Follow up in 6 weeks.   11/30/2020 Beverly Cellar MD (GI) - Patient was seen for lower abdominal pain and additional issues. Started peppermint oil 90 mg and Sutab 1479-225-188 mg 1 kit once. No follow up noted.   11/27/2020 Beverly Ballard (orthopedic) - Patient was seen for radiculopathy of cervical spine. No medication changes. Follow up if symptoms worsen or fail to improve.   11/07/2020 Beverly Newer MD (neurology) - Patient was seen for Generalized idiopathic epilepsy and epileptic syndromes and additional issue. Discontinued Oxybutinin and Prednisone. Follow up in 6-8 months.   10/30/2020 Beverly Ballard (orthopedic) - Patient was seen for chronic left shoulder pain and an additional issue. Started prednisone 5 mg as directed. Follow up after MRI.    10/10/2020 Beverly Screws MD (internal medicine) - Patient was seen for postablative hypothyroidism. No medication changes. Follow up in 1 year.   10/08/2020 Beverly Rist MD (pulmonary) - Patient was seen for OSA on CPAP and an additional issue. No medication changes. Follow up in 3 months.  Hospital visits: Patient was seen at [facility] Emergency Department on 03/07/2021 due to Pneumonia or right lung. Discharged from [facility] Department after 5 hours on 03/07/2021 New?Medications Started at [facility] Discharge:?? -started Augmentin 875/125 mg twice daily X 7 days              Azithromycin 250 mg daily              Benzonatate 100 mg every 8 hours as needed              HYCODAN 5-1.5 MG/5ML syrup 5 mls every 4 hours as needed Medication Changes at Hospital Discharge: -None Medications Discontinued at Hospital Discharge: -None?  -All other medications will remain the same.       Admitted to [facility] on 02/16/2021 due to Seizures. Discharge date was 02/18/2021. Discharged from [place]?Medications Started at [facility] Discharge:?? -started Dexamethasone  4 mg 4 times daily. Medication Changes at Hospital Discharge: -None Medications Discontinued at Hospital Discharge: -Stopped Flexeril 10 mg                  Ibuprofen 400 mg                 Lorazepam 1 mg  -All other medications will remain the same.    Objective:  Lab Results  Component Value Date   CREATININE 0.89 03/07/2021   BUN 13 03/07/2021   GFR 63.82 09/19/2020   GFRNONAA >60 03/07/2021   GFRAA >60 12/20/2019   NA 141 03/07/2021   K 4.3 03/07/2021   CALCIUM 8.9 03/07/2021   CO2 29 03/07/2021   GLUCOSE 120 (H) 03/07/2021    Lab Results  Component Value Date/Time   HGBA1C 5.7 06/24/2019 01:09 PM   HGBA1C 5.7 12/30/2018 09:32 AM   GFR 63.82 09/19/2020 11:55 AM   GFR 46.88 (L) 11/18/2019 03:05 PM    Last diabetic Eye exam: No results found for: HMDIABEYEEXA  Last diabetic Foot exam: No results found for: HMDIABFOOTEX   Lab Results  Component Value Date   CHOL 172 03/19/2020   HDL 50 03/19/2020   LDLCALC 100 (H) 03/19/2020   LDLDIRECT 162.6 01/12/2013   TRIG 123 03/19/2020   CHOLHDL 3.4 03/19/2020    Hepatic Function Latest  Ref Rng & Units 03/07/2021 02/27/2021 02/16/2021  Total Protein 6.5 - 8.1 g/dL 6.8 6.8 6.6  Albumin 3.5 - 5.0 g/dL 3.9 3.4(L) 3.4(L)  AST 15 - 41 U/L 14(L) 21 32  ALT 0 - 44 U/L '13 16 12  ' Alk Phosphatase 38 - 126 U/L 81 73 81  Total Bilirubin 0.3 - 1.2 mg/dL 0.3 0.4 0.5  Bilirubin, Direct 0.0 - 0.3 mg/dL - - -    Lab Results  Component Value Date/Time   TSH 0.819 02/26/2021 04:37 AM   TSH 1.63 10/10/2020 11:48 AM   TSH 2.14 03/19/2020 01:01 PM   FREET4 0.77 10/10/2020 11:48 AM   FREET4 1.0 03/19/2020 01:01 PM    CBC Latest Ref Rng & Units 03/07/2021 03/01/2021 02/28/2021  WBC 4.0 - 10.5 K/uL 14.2(H) 12.6(H) 12.0(H)  Hemoglobin 12.0 - 15.0 g/dL 12.9 13.4 12.4  Hematocrit 36.0 - 46.0 % 40.0 42.5 37.8  Platelets 150 - 400 K/uL 183 189 179    Lab Results  Component Value Date/Time   VD25OH 41.73 06/24/2019 01:09 PM   VD25OH  25.20 (L) 03/15/2018 11:46 AM    Clinical ASCVD: {YES/NO:21197} The 10-year ASCVD risk score (Arnett DK, et al., 2019) is: 23.8%   Values used to calculate the score:     Age: 86 years     Sex: Female     Is Non-Hispanic African American: No     Diabetic: No     Tobacco smoker: No     Systolic Blood Pressure: 283 mmHg     Is BP treated: Yes     HDL Cholesterol: 50 mg/dL     Total Cholesterol: 172 mg/dL    Depression screen [facility] 2/9 03/13/2021 01/22/2021 09/19/2020  Decreased Interest 0 0 0  Down, Depressed, Hopeless 3 1 0  PHQ - 2 Score 3 1 0  Altered sleeping 3 - 3  Tired, decreased energy 3 - 3  Change in appetite 3 - 3  Feeling bad or failure about yourself  0 - 0  Trouble concentrating 3 - 0  Moving slowly or fidgety/restless - - 0  Suicidal thoughts 0 - 0  PHQ-9 Score 15 - 9  Difficult doing work/chores - - -  Some recent data might be hidden     ***Other: (CHADS2VASc if Afib, MMRC or CAT for COPD, ACT, DEXA)  Social History   Tobacco Use  Smoking Status Former   Packs/day: 0.25   Years: 20.00   Pack years: 5.00   Types: Cigarettes   Quit date: 09/08/1978   Years since quitting: 42.6  Smokeless Tobacco Never  Tobacco Comments   quit smoking 60yr ago   BP Readings from Last 3 Encounters:  03/29/21 (!) 155/67  03/18/21 (!) 142/67  03/13/21 (!) 122/58   Pulse Readings from Last 3 Encounters:  03/29/21 80  03/18/21 81  03/13/21 83   Wt Readings from Last 3 Encounters:  03/29/21 233 lb 12.8 oz (106.1 kg)  03/18/21 234 lb 3.2 oz (106.2 kg)  03/07/21 229 lb 8 oz (104.1 kg)   BMI Readings from Last 3 Encounters:  03/29/21 38.91 kg/m  03/18/21 38.97 kg/m  03/13/21 38.19 kg/m    Assessment/Interventions: Review of patient past medical history, allergies, medications, health status, including review of consultants reports, laboratory and other test data, was performed as part of comprehensive evaluation and provision of chronic care management services.    SDOH:  (Social Determinants of Health) assessments and interventions performed: {yes/no:20286}  SDOH Screenings  Alcohol Screen: Not on file  Depression (PHQ2-9): Medium Risk   PHQ-2 Score: 15  Financial Resource Strain: Low Risk    Difficulty of Paying Living Expenses: Not very hard  Food Insecurity: Unknown   Worried About Charity fundraiser in the Last Year: Patient refused   Arboriculturist in the Last Year: Never true  Housing: Low Risk    Last Housing Risk Score: 0  Physical Activity: Unknown   Days of Exercise per Week: 1 day   Minutes of Exercise per Session: Patient refused  Social Connections: Moderately Isolated   Frequency of Communication with Friends and Family: Three times a week   Frequency of Social Gatherings with Friends and Family: Three times a week   Attends Religious Services: Never   Active Member of Clubs or Organizations: No   Attends Archivist Meetings: Never   Marital Status: Married  Stress: Stress Concern Present   Feeling of Stress : Very much  Tobacco Use: Medium Risk   Smoking Tobacco Use: Former   Smokeless Tobacco Use: Never   Passive Exposure: Not on file  Transportation Needs: No Transportation Needs   Lack of Transportation (Medical): No   Lack of Transportation (Non-Medical): No    CCM Care Plan  Allergies  Allergen Reactions   Lamotrigine Swelling    Tongue swelling   Bupropion Other (See Comments)    Unknown reaction   Carbamazepine Other (See Comments)    Hypnatremia   Levetiracetam Other (See Comments)    dizziness   Tramadol Other (See Comments)    Unknown reaction   Adhesive [Tape] Other (See Comments)    ?  Blister after knee surgery   Amitriptyline Nausea And Vomiting   Clarithromycin Other (See Comments)    Unknown reaction     Doxycycline Other (See Comments)    Interfered with seizure medication   Nickel Other (See Comments)    Had to remove knee implant with nickel and replace it   Other  Other (See Comments)    Allergic to Metal and perfumes (unknown reaction)   Topamax [Topiramate] Nausea And Vomiting   Estradiol Rash   Latex Rash   Phenytoin Sodium Extended Other (See Comments)    drowsiness    Medications Reviewed Today     Reviewed by Eppie Gibson, MD (Physician) on 03/30/21 at 0758  Med List Status: <None>   Medication Order Taking? Sig Documenting Provider Last Dose Status Informant  albuterol (VENTOLIN HFA) 108 (90 Base) MCG/ACT inhaler [id number]  Inhale 1-2 puffs into the lungs every 6 (six) hours as needed for wheezing or shortness of breath. Volanda Napoleon, PA-C  Active Multiple Informants           Med Note Eino Farber   Thu Mar 07, 2021  1:02 PM) prn  benzonatate (TESSALON) 100 MG capsule [id number]  Take 1 capsule (100 mg total) by mouth every 8 (eight) hours.  Patient not taking: Reported on 03/18/2021   Charlesetta Shanks, MD  Active   Budeson-Glycopyrrol-Formoterol (BREZTRI AEROSPHERE) 160-9-4.8 MCG/ACT Hollie Salk [id number]  Inhale 2 puffs into the lungs 2 (two) times daily. Caren Macadam, MD  Active Multiple Informants  busPIRone (BUSPAR) 10 MG tablet [id number]  TAKE 1 TABLET BY MOUTH TWICE A DAY  Patient taking differently: Take 10 mg by mouth 2 (two) times daily.   Addison Lank, PA-C  Active Multiple Informants  cholecalciferol (VITAMIN D3) 25 MCG (1000 UNIT) tablet [id number]  Take 1,000 Units  by mouth every morning. [provider]  Active Multiple Informants  dexamethasone (DECADRON) 4 MG tablet [id number]  Take 0.5 tablets (2 mg total) by mouth daily. Ventura Sellers, MD  Active   EPINEPHrine 0.3 mg/0.3 mL IJ SOAJ injection [id number]  Inject 0.3 mg into the muscle once as needed for anaphylaxis. [provider]  Active Multiple Informants           Med Note Eino Farber   Thu Mar 07, 2021  1:02 PM) prn  esomeprazole (NEXIUM) 20 MG capsule [id number]  Take 2 capsules (40 mg total) by mouth daily before  breakfast.  Patient taking differently: Take 20 mg by mouth daily before breakfast.   Laurey Morale, MD  Active Multiple Informants           Med Note Rosine Beat Feb 18, 2021 11:34 AM) OTC  ferrous sulfate 325 (65 FE) MG tablet [id number]  Take 325 mg by mouth 3 (three) times a week. [provider]  Active Multiple Informants           Med Note Orvan Seen, Sharlette Dense   Mon Feb 18, 2021 11:28 AM) No particular days of the week  furosemide (LASIX) 20 MG tablet [id number]  Take 1 tablet (20 mg total) by mouth every morning. Caren Macadam, MD  Active Multiple Informants  HYDROcodone bit-homatropine (HYCODAN) 5-1.5 MG/5ML syrup [id number]  Take 5 mLs by mouth every 4 (four) hours as needed for cough. Charlesetta Shanks, MD  Active   HYDROcodone-acetaminophen (NORCO/VICODIN) 5-325 MG tablet [id number]  Take 1 tablet by mouth every 8 (eight) hours as needed for moderate pain. Eustace Moore, MD  Active Multiple Informants  levETIRAcetam (KEPPRA) 500 MG tablet [id number]  Take 1 tablet (500 mg total) by mouth 2 (two) times daily. Bonnielee Haff, MD  Active Multiple Informants  levocetirizine (XYZAL) 5 MG tablet [id number]  Take 5 mg by mouth at bedtime. [provider]  Active Multiple Informants  levothyroxine (SYNTHROID) 88 MCG tablet [id number]  Take 1 tablet (88 mcg total) by mouth daily.  Patient taking differently: Take 88 mcg by mouth daily before breakfast.   Philemon Kingdom, MD  Active Multiple Informants  lisinopril (ZESTRIL) 5 MG tablet [id number]  TAKE 1 TABLET BY MOUTH EVERY DAY  Patient taking differently: Take 5 mg by mouth every morning.   Caren Macadam, MD  Active Multiple Informants  ondansetron (ZOFRAN ODT) 4 MG disintegrating tablet [id number]  Take 1 tablet (4 mg total) by mouth every 6 (six) hours as needed for nausea or vomiting. Caren Macadam, MD  Active Multiple Informants           Med Note Olean Ree Mar 07, 2021  1:04  PM) Needs refill  pravastatin (PRAVACHOL) 20 MG tablet [id number]  TAKE 1 TABLET (20 MG TOTAL) BY MOUTH DAILY.  Patient taking differently: Take 20 mg by mouth at bedtime.   Caren Macadam, MD  Active Multiple Informants  sertraline (ZOLOFT) 100 MG tablet [id number]  TAKE 2 TABLETS BY MOUTH EVERY DAY Addison Lank, PA-C  Active   solifenacin (VESICARE) 10 MG tablet [id number]  Take 10 mg by mouth at bedtime. [provider]  Active Multiple Informants  Spacer/Aero-Holding Chambers (AEROCHAMBER PLUS) inhaler [id number]  Use as instructed Caren Macadam, MD  Active Multiple Informants  vitamin B-12 (CYANOCOBALAMIN) 1000 MCG tablet [id number]  Take 1,000 mcg by mouth every morning. [provider]  Active Multiple Informants  zonisamide (ZONEGRAN) 100 MG capsule [id number]  Take 2 capsules twice a day  Patient taking differently: Take 200 mg by mouth 2 (two) times daily. Seizure medication   Cameron Sprang, MD  Active Multiple Informants            Patient Active Problem List   Diagnosis Date Noted   Goals of care, counseling/discussion 03/18/2021   Seizure ([place]) 02/28/2021   S/P craniotomy 02/22/2021   High grade glioma not classifiable by WHO criteria ([place]) 02/17/2021   Subarachnoid hemorrhage ([place]) 02/17/2021   Acute hypoxemic respiratory failure ([place]) 02/17/2021   Atrial flutter ([place]) 02/17/2021   Seizures ([place]) 02/16/2021   Generalized anxiety disorder 08/11/2020   Insomnia 08/11/2020   Major depressive disorder, recurrent episode, moderate ([place]) 08/11/2020   Obstructive sleep apnea 08/11/2020   Ankle edema, bilateral 10/31/2019   Excessive daytime sleepiness 06/28/2019   Shortness of breath 06/28/2019   Healthcare maintenance 06/28/2019   ANA positive 06/28/2019   Bronchiectasis ([place]) 06/28/2019   Depression, major, single episode, moderate ([place]) 11/19/2018   Fatigue 03/23/2018   Left knee pain 01/12/2017   Ovarian cyst 10/30/2016   Aortic  atherosclerosis ([place]) 11/19/2015   Hyperlipemia 04/03/2015   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) 01/24/2014   Obesity, BMI unknown 01/24/2014   Osteopenia, stable on dexa 2010 - followed by gyn 06/15/2012   Pap smear abnormality of cervix/human papillomavirus (HPV) positive - 09/2011, followed by gyn 06/15/2012   Essential hypertension 07/03/2009   Hypothyroidism 05/27/2007   Allergic rhinitis 05/27/2007   GERD 05/27/2007    Immunization History  Administered Date(s) Administered   Fluad Quad(high Dose 65+) 01/27/2019, 01/11/2020, 02/11/2021   Influenza Whole 01/19/2009, 12/19/2009   Influenza, High Dose Seasonal PF 02/07/2014, 01/08/2015, 02/20/2015, 01/03/2016, 02/20/2016, 01/01/2017, 01/20/2017, 01/28/2018   Influenza,inj,Quad PF,6+ Mos 01/24/2014   Influenza-Unspecified 01/06/2012   PFIZER(Purple Top)SARS-COV-2 Vaccination 07/02/2019, 07/23/2019, 01/30/2020, 09/16/2020   Pneumococcal Conjugate-13 09/19/2009   Pneumococcal Polysaccharide-23 06/20/2013, 02/20/2015, 02/20/2016, 01/20/2017, 02/03/2018, 01/21/2021   Tdap 05/30/2014   Varicella 09/23/2010    Conditions to be addressed/monitored:  Hypertension, GERD, Hypothyroidism, Osteopenia, Allergic Rhinitis, and Seizures  There are no care plans that you recently modified to display for this patient.  Current Barriers:  {pharmacybarriers:24917}  Pharmacist Clinical Goal(s):  Patient will {PHARMACYGOALCHOICES:24921} through collaboration with PharmD and provider.   Interventions: 1:1 collaboration with Caren Macadam, MD regarding development and update of comprehensive plan of care as evidenced by provider attestation and co-signature Inter-disciplinary care team collaboration (see longitudinal plan of care) Comprehensive medication review performed; medication list updated in electronic medical record  BP Readings from Last 3 Encounters:  03/29/21 (!) 155/67  03/18/21 (!) 142/67   03/13/21 (!) 122/58    Hypertension (BP goal <140/90) -Not ideally controlled -Current treatment: Lisinopril 5 mg 1 tablet daily -Medications previously tried: ***  -Current home readings: *** -Current dietary habits: *** -Current exercise habits: *** -{ACTIONS;DENIES/REPORTS:21021675::"Denies"} hypotensive/hypertensive symptoms -Educated on {CCM BP Counseling:25124} -Counseled to monitor BP at home ***, document, and provide log at future appointments -{CCMPHARMDINTERVENTION:25122}  Swelling (Goal: ***) -{US controlled/uncontrolled:25276} -Current treatment  Furosemide 20 mg 1 tablet every morning -Medications previously tried: ***  -{CCMPHARMDINTERVENTION:25122}  Lab Results  Component Value Date   CHOL 172 03/19/2020   HDL 50 03/19/2020   LDLCALC 100 (H) 03/19/2020   LDLDIRECT 162.6 01/12/2013   TRIG 123 03/19/2020   CHOLHDL 3.4 03/19/2020     Hyperlipidemia/aortic atherosclerosis: (LDL goal < ***) -{  [place] controlled/uncontrolled:25276} -Current treatment: Pravastatin 20 mg 1 tablet daily at bedtime -Medications previously tried: ***  -Current dietary patterns: *** -Current exercise habits: *** -Educated on {CCM HLD Counseling:25126} -{CCMPHARMDINTERVENTION:25122}    COPD (Goal: control symptoms and prevent exacerbations) -{US controlled/uncontrolled:25276} -Current treatment  Breztri 160-9-4.8 mcg/act 2 puffs twice daily Albuterol as needed -Medications previously tried: ***  -Gold Grade: {CHL HP Upstream Pharm COPD Gold MGQQP:[id number]} -Current COPD Classification:  {CHL HP Upstream Pharm COPD [phone]} -MMRC/CAT score: *** -Pulmonary function testing: *** -Exacerbations requiring treatment in last 6 months: *** -Patient {Actions; denies-reports:120008} consistent use of maintenance inhaler -Frequency of rescue inhaler use: *** -Counseled on {CCMINHALERCOUNSELING:25121} -{CCMPHARMDINTERVENTION:25122}  Depression/Anxiety (Goal:  ***) -{US controlled/uncontrolled:25276} -Current treatment: Buspirone 10 mg 1 tablet twice daily Sertraline 100 mg 2 tablets daily -Medications previously tried/failed: *** -PHQ9: *** -GAD7: *** -Connected with *** for mental health support -Educated on {CCM mental health counseling:25127} -{CCMPHARMDINTERVENTION:25122}  Osteoporosis / Osteopenia (Goal ***) -{US controlled/uncontrolled:25276} -Last DEXA Scan: ***   T-Score femoral neck: ***  T-Score total hip: ***  T-Score lumbar spine: ***  T-Score forearm radius: ***  10-year probability of major osteoporotic fracture: ***  10-year probability of hip fracture: *** -Patient {is;is not an osteoporosis candidate:23886} -Current treatment  Vitamin D 1000 units daily -Medications previously tried: ***  -{Osteoporosis Counseling:23892} -{CCMPHARMDINTERVENTION:25122}  Allergic rhinitis (Goal: ***) -{US controlled/uncontrolled:25276} -Current treatment  Epipen as needed Xyzal 5 mg 1 tablet at bedtime -Medications previously tried: ***  -{CCMPHARMDINTERVENTION:25122}  Seizures (Goal: ***) -{US controlled/uncontrolled:25276} -Current treatment  Levetiracetam 500 mg 1 tablet twice daily Zonisamide 100 mg 2 capsules twice daily -Medications previously tried: ***  -{CCMPHARMDINTERVENTION:25122}  Lab Results  Component Value Date   TSH 0.819 02/26/2021    Hypothyroidism (Goal: 2-4) -Not ideally controlled -Current treatment  Levothyroxine 88 mcg 1 tablet daily -Medications previously tried: ***  -{CCMPHARMDINTERVENTION:25122}  GERD (Goal: ***) -{US controlled/uncontrolled:25276} -Current treatment  Esomeprazole 20 mg 1 capsule daily -Medications previously tried: ***  -{CCMPHARMDINTERVENTION:25122}  Overactive bladder (Goal: ***) -{US controlled/uncontrolled:25276} -Current treatment  Solifenacin 10 mg 1 tablet at bedtime -Medications previously tried: ***  -{CCMPHARMDINTERVENTION:25122}  Cancer (Goal:  ***) -{US controlled/uncontrolled:25276} -Current treatment  Temodar 140 mg & 20 mg 1 capsule of each daily -Medications previously tried: ***  -{CCMPHARMDINTERVENTION:25122}  Pain (Goal: ***) -{US controlled/uncontrolled:25276} -Current treatment  Hydrocodone-APAP 5-325 mg 1 tablet as needed -Medications previously tried: ***  -{CCMPHARMDINTERVENTION:25122}   Health Maintenance -Vaccine gaps: shingrix, COVID booster -Current therapy:  Ferrous sulfate 325 mg 1 tablet daily Vitamin B12 1000 mcg 1 tablet daily Ondansetron 4 mg as needed -Educated on {ccm supplement counseling:25128} -{CCM Patient satisfied:25129} -{CCMPHARMDINTERVENTION:25122}  Patient Goals/Self-Care Activities Patient will:  - {pharmacypatientgoals:24919}  Follow Up Plan: {CM FOLLOW UP TIWP:80998}   Medication Assistance: {MEDASSISTANCEINFO:25044}  Compliance/Adherence/Medication fill history: Care Gaps: COVID booster, shingrix Last BP - 142/67 on 03/18/2021  Star-Rating Drugs: Lisinopril 5 mg - last [facility] Pravastatin 20 mg - last filled [facility]/22/2021 [facility]  Patient's preferred pharmacy is:  [id number] [address] [place]Phone: [phone]: [phone] [id number] [place], [place] [place] Lakes[place]hone: [phone]: [phone]hone: [phone]: [address] [address] [address]VSacred Heart UniversityCAlaska[address]hone: [phone]: [phone]  pill box? {Yes or If no, why not?:20788} Pt endorses ***% compliance  We discussed: {Pharmacy options:24294} Patient decided to: {US Pharmacy [facility]  Care Plan and Follow Up Patient Decision:  {FOLLOWUP:24991}  Plan: {CM FOLLOW UP  FHPD:90092}  Jeni Salles, PharmD, [facility] Clinical Pharmacist {MP practice sites:26434} [phone]

## 2021-04-16 ENCOUNTER — Telehealth: Payer: Medicare HMO

## 2021-04-16 ENCOUNTER — Telehealth: Payer: Self-pay | Admitting: Pharmacist

## 2021-04-16 ENCOUNTER — Ambulatory Visit
Admission: RE | Admit: 2021-04-16 | Discharge: 2021-04-16 | Disposition: A | Discharge status: Home / Self Care | Payer: Medicare HMO | Source: Ambulatory Visit | Attending: Radiation Oncology | Admitting: Radiation Oncology

## 2021-04-16 ENCOUNTER — Other Ambulatory Visit: Payer: Self-pay

## 2021-04-16 DIAGNOSIS — C713 Malignant neoplasm of parietal lobe: Secondary | ICD-10-CM | POA: Diagnosis not present

## 2021-04-16 DIAGNOSIS — Z510 Encounter for antineoplastic radiation therapy: Secondary | ICD-10-CM | POA: Diagnosis not present

## 2021-04-16 NOTE — Telephone Encounter (Signed)
°  Chronic Care Management   Outreach Note  04/16/2021 Name: MANHATTAN MCCUEN MRN: [id number] DOB: [birth year]  Referred by: Caren Macadam, MD  Patient had a phone appointment scheduled with clinical pharmacist today.  An unsuccessful telephone outreach was attempted today. The patient was referred to the pharmacist for assistance with care management and care coordination.   If possible, a message was left to return call to: [phone] or to [facility]: [place], PharmD, [place] [facility] [place]

## 2021-04-17 ENCOUNTER — Ambulatory Visit
Admission: RE | Admit: 2021-04-17 | Discharge: 2021-04-17 | Disposition: A | Discharge status: Home / Self Care | Payer: Medicare HMO | Source: Ambulatory Visit | Attending: Radiation Oncology | Admitting: Radiation Oncology

## 2021-04-17 DIAGNOSIS — I480 Paroxysmal atrial fibrillation: Secondary | ICD-10-CM | POA: Diagnosis not present

## 2021-04-17 DIAGNOSIS — D496 Neoplasm of unspecified behavior of brain: Secondary | ICD-10-CM | POA: Diagnosis not present

## 2021-04-17 DIAGNOSIS — G40409 Other generalized epilepsy and epileptic syndromes, not intractable, without status epilepticus: Secondary | ICD-10-CM | POA: Diagnosis not present

## 2021-04-17 DIAGNOSIS — Z510 Encounter for antineoplastic radiation therapy: Secondary | ICD-10-CM | POA: Diagnosis not present

## 2021-04-17 DIAGNOSIS — J479 Bronchiectasis, uncomplicated: Secondary | ICD-10-CM | POA: Diagnosis not present

## 2021-04-17 DIAGNOSIS — M810 Age-related osteoporosis without current pathological fracture: Secondary | ICD-10-CM | POA: Diagnosis not present

## 2021-04-17 DIAGNOSIS — Z483 Aftercare following surgery for neoplasm: Secondary | ICD-10-CM | POA: Diagnosis not present

## 2021-04-17 DIAGNOSIS — I119 Hypertensive heart disease without heart failure: Secondary | ICD-10-CM | POA: Diagnosis not present

## 2021-04-17 DIAGNOSIS — I4892 Unspecified atrial flutter: Secondary | ICD-10-CM | POA: Diagnosis not present

## 2021-04-17 DIAGNOSIS — C713 Malignant neoplasm of parietal lobe: Secondary | ICD-10-CM | POA: Diagnosis not present

## 2021-04-17 DIAGNOSIS — R69 Illness, unspecified: Secondary | ICD-10-CM | POA: Diagnosis not present

## 2021-04-18 ENCOUNTER — Other Ambulatory Visit: Payer: Self-pay

## 2021-04-18 ENCOUNTER — Ambulatory Visit
Admission: RE | Admit: 2021-04-18 | Discharge: 2021-04-18 | Disposition: A | Discharge status: Home / Self Care | Payer: Medicare HMO | Source: Ambulatory Visit | Attending: Radiation Oncology | Admitting: Radiation Oncology

## 2021-04-18 DIAGNOSIS — I480 Paroxysmal atrial fibrillation: Secondary | ICD-10-CM | POA: Diagnosis not present

## 2021-04-18 DIAGNOSIS — G40409 Other generalized epilepsy and epileptic syndromes, not intractable, without status epilepticus: Secondary | ICD-10-CM | POA: Diagnosis not present

## 2021-04-18 DIAGNOSIS — D496 Neoplasm of unspecified behavior of brain: Secondary | ICD-10-CM | POA: Diagnosis not present

## 2021-04-18 DIAGNOSIS — R69 Illness, unspecified: Secondary | ICD-10-CM | POA: Diagnosis not present

## 2021-04-18 DIAGNOSIS — I119 Hypertensive heart disease without heart failure: Secondary | ICD-10-CM | POA: Diagnosis not present

## 2021-04-18 DIAGNOSIS — Z510 Encounter for antineoplastic radiation therapy: Secondary | ICD-10-CM | POA: Diagnosis not present

## 2021-04-18 DIAGNOSIS — C713 Malignant neoplasm of parietal lobe: Secondary | ICD-10-CM | POA: Diagnosis not present

## 2021-04-18 DIAGNOSIS — M810 Age-related osteoporosis without current pathological fracture: Secondary | ICD-10-CM | POA: Diagnosis not present

## 2021-04-18 DIAGNOSIS — Z483 Aftercare following surgery for neoplasm: Secondary | ICD-10-CM | POA: Diagnosis not present

## 2021-04-18 DIAGNOSIS — J479 Bronchiectasis, uncomplicated: Secondary | ICD-10-CM | POA: Diagnosis not present

## 2021-04-18 DIAGNOSIS — I4892 Unspecified atrial flutter: Secondary | ICD-10-CM | POA: Diagnosis not present

## 2021-04-19 ENCOUNTER — Telehealth: Payer: Self-pay | Admitting: Family Medicine

## 2021-04-19 ENCOUNTER — Ambulatory Visit
Admission: RE | Admit: 2021-04-19 | Discharge: 2021-04-19 | Disposition: A | Discharge status: Home / Self Care | Payer: Medicare HMO | Source: Ambulatory Visit | Attending: Radiation Oncology | Admitting: Radiation Oncology

## 2021-04-19 DIAGNOSIS — C713 Malignant neoplasm of parietal lobe: Secondary | ICD-10-CM | POA: Diagnosis not present

## 2021-04-19 DIAGNOSIS — Z510 Encounter for antineoplastic radiation therapy: Secondary | ICD-10-CM | POA: Diagnosis not present

## 2021-04-19 NOTE — Telephone Encounter (Signed)
Marshell Garfinkel from [facility] called to get verbal orders to move patient's social work evaluation to next week     Good callback number is [phone] No voicemails

## 2021-04-19 NOTE — Telephone Encounter (Signed)
Ok. Please make sure patient is doing ok; doesn't need anything from [place]. Looks like multiple home visits were postponed this week.

## 2021-04-19 NOTE — Telephone Encounter (Signed)
Spoke with Levada Dy and informed her of the message below.

## 2021-04-20 ENCOUNTER — Other Ambulatory Visit: Payer: Self-pay | Admitting: Family Medicine

## 2021-04-22 DIAGNOSIS — G4733 Obstructive sleep apnea (adult) (pediatric): Secondary | ICD-10-CM | POA: Diagnosis not present

## 2021-04-23 ENCOUNTER — Other Ambulatory Visit: Payer: Self-pay | Admitting: Radiation Oncology

## 2021-04-23 ENCOUNTER — Encounter: Payer: Self-pay | Admitting: Cardiology

## 2021-04-23 ENCOUNTER — Other Ambulatory Visit: Payer: Self-pay

## 2021-04-23 ENCOUNTER — Other Ambulatory Visit: Payer: Self-pay | Admitting: *Deleted

## 2021-04-23 ENCOUNTER — Ambulatory Visit
Admission: RE | Admit: 2021-04-23 | Discharge: 2021-04-23 | Disposition: A | Discharge status: Home / Self Care | Payer: Medicare HMO | Source: Ambulatory Visit | Attending: Radiation Oncology | Admitting: Radiation Oncology

## 2021-04-23 DIAGNOSIS — C719 Malignant neoplasm of brain, unspecified: Secondary | ICD-10-CM

## 2021-04-23 DIAGNOSIS — C713 Malignant neoplasm of parietal lobe: Secondary | ICD-10-CM | POA: Diagnosis not present

## 2021-04-23 DIAGNOSIS — Z510 Encounter for antineoplastic radiation therapy: Secondary | ICD-10-CM | POA: Diagnosis not present

## 2021-04-23 DIAGNOSIS — I480 Paroxysmal atrial fibrillation: Secondary | ICD-10-CM | POA: Insufficient documentation

## 2021-04-23 MED ORDER — ONDANSETRON HCL 8 MG PO TABS: 8.0000 mg | ORAL_TABLET | Freq: Three times a day (TID) | ORAL | 1 refills | Status: DC | PRN

## 2021-04-23 MED ORDER — HYDROCODONE-ACETAMINOPHEN 5-325 MG PO TABS: 1.0000 | ORAL_TABLET | ORAL | 0 refills | Status: DC | PRN

## 2021-04-23 MED ORDER — DEXAMETHASONE 4 MG PO TABS: 4.0000 mg | ORAL_TABLET | Freq: Every day | ORAL | 0 refills | Status: DC

## 2021-04-23 NOTE — Progress Notes (Signed)
Called pt to inform of [facility] [address]. Left voicemail with instructions on taking decadron daily. Advise pt to call office for any concerns.

## 2021-04-23 NOTE — Progress Notes (Deleted)
Cardiology Office Note   Date:  04/23/2021   ID:  Kjirsten, Bloodgood [birth year], MRN [id number]  PCP:  Caren Macadam, MD  Cardiologist:   Minus Breeding, MD Referring:  ***  No chief complaint on file.     History of Present Illness: Beverly Ballard is a 76 y.o. female who presents for follow up of PAF.  This was noted when she had surgery for a primary brain tumor.  Echo was unremarkable. She wore a monitor and had NSR.  There was no evidence of atrial fib.  She had runs of SVT.   ***      ***  Patient had a min HR of 59 bpm, max HR of 150 bpm, and avg HR of 80 bpm. Predominant underlying rhythm was Sinus Rhythm. First Degree AV Block was present. 7 Supraventricular Tachycardia runs occurred, the run with the fastest interval lasting 7 beats with a max rate of 150 bpm, the longest lasting 21.8 secs with an avg rate of 127 bpm. True duration of Supraventricular Tachycardia difficult to ascertain due to artifact. Isolated SVEs were rare (<1.0%), SVE Couplets were rare (<1.0%), and SVE Triplets were rare (<1.0%). Isolated VEs were rare (<1.0%, 73), VE Triplets were rare (<1.0%, 2), and no VE Couplets were present.   Past Medical History:  Diagnosis Date   Allergy    Anemia    Anxiety and depression 12/19/2009   Arthritis    Cancer ([place])    vaginal   Degenerative disorder of eye    Diverticulosis    Dysrhythmia 01/2021   Atrial fib/flutter   Essential hypertension, benign 06/26/2009   GERD 05/27/2007   takes Nexium daily   History of gout    History of migraine many yrs ago   Hyperlipidemia    takes Pravastatin daily   Hypothyroidism    Insomnia    but doesn't take any meds   LOW BACK PAIN 05/27/2007   Osteoporosis    Seizures ([place])    last 1983, none since then- last seizure 1988 per pt    Sleep apnea    wears CPAP    Past Surgical History:  Procedure Laterality Date   ADENOIDECTOMY     APPLICATION OF CRANIAL NAVIGATION N/A 02/22/2021   Procedure:  APPLICATION OF CRANIAL NAVIGATION;  Surgeon: Eustace Moore, MD;  Location: [place];  Service: Neurosurgery;  Laterality: N/A;   BACK SURGERY     lumbar x2   BRONCHIAL BIOPSY  09/12/2019   Procedure: BRONCHIAL BIOPSIES;  Surgeon: Laurin Coder, MD;  [facility];  Service: Endoscopy;;   BRONCHIAL WASHINGS  09/12/2019   Procedure: BRONCHIAL WASHINGS;  Surgeon: Laurin Coder, MD;  [facility];  Service: Endoscopy;;   COLONOSCOPY  2009   CRANIOTOMY N/A 02/22/2021   Procedure: CRANIOTOMY FOR TUMOR EXCISION;  Surgeon: Eustace Moore, MD;  Location: [place];  Service: Neurosurgery;  Laterality: N/A;   ESOPHAGOGASTRODUODENOSCOPY     Heel spurs Bilateral    HEMOSTASIS CONTROL  09/12/2019   Procedure: HEMOSTASIS CONTROL;  Surgeon: Laurin Coder, MD;  [facility];  Service: Endoscopy;;  epi   KNEE ARTHROPLASTY Left 11/07/2013   Procedure: COMPUTER ASSISTED TOTAL KNEE ARTHROPLASTY;  Surgeon: Marybelle Killings, MD;  Location: [place];  Service: Orthopedics;  Laterality: Left;  Left Total Knee Arthroplasty, Cemented, Computer Assist   TONSILLECTOMY     TOTAL KNEE REVISION Left 09/19/2014   Procedure: LEFT TOTAL KNEE REVISION;  Surgeon: Leandrew Koyanagi, MD;  Location: [place];  Service: Orthopedics;  Laterality: Left;   UPPER GASTROINTESTINAL ENDOSCOPY     VIDEO BRONCHOSCOPY N/A 09/12/2019   Procedure: VIDEO BRONCHOSCOPY WITH FLUORO;  Surgeon: Laurin Coder, MD;  [facility];  Service: Endoscopy;  Laterality: N/A;     Current Outpatient Medications  Medication Sig Dispense Refill   albuterol (VENTOLIN HFA) 108 (90 Base) MCG/ACT inhaler Inhale 1-2 puffs into the lungs every 6 (six) hours as needed for wheezing or shortness of breath. 8 g 0   benzonatate (TESSALON) 100 MG capsule Take 1 capsule (100 mg total) by mouth every 8 (eight) hours. (Patient not taking: Reported on 03/18/2021) 21 capsule 0   Budeson-Glycopyrrol-Formoterol (BREZTRI AEROSPHERE) 160-9-4.8  MCG/ACT AERO Inhale 2 puffs into the lungs 2 (two) times daily. 5.9 g 0   busPIRone (BUSPAR) 10 MG tablet TAKE 1 TABLET BY MOUTH TWICE A DAY (Patient taking differently: Take 10 mg by mouth 2 (two) times daily.) 180 tablet 0   cholecalciferol (VITAMIN D3) 25 MCG (1000 UNIT) tablet Take 1,000 Units by mouth every morning.     dexamethasone (DECADRON) 4 MG tablet Take 0.5 tablets (2 mg total) by mouth daily. 30 tablet 0   dexamethasone (DECADRON) 4 MG tablet Take 1 tablet (4 mg total) by mouth daily. Take 4 mg daily for 3 days, then take 2 mg thereafter, daily 30 tablet 0   EPINEPHrine 0.3 mg/0.3 mL IJ SOAJ injection Inject 0.3 mg into the muscle once as needed for anaphylaxis.     esomeprazole (NEXIUM) 20 MG capsule Take 2 capsules (40 mg total) by mouth daily before breakfast. (Patient taking differently: Take 20 mg by mouth daily before breakfast.) 2 capsule 0   ferrous sulfate 325 (65 FE) MG tablet Take 325 mg by mouth 3 (three) times a week.     furosemide (LASIX) 20 MG tablet Take 1 tablet (20 mg total) by mouth every morning. 90 tablet 1   HYDROcodone bit-homatropine (HYCODAN) 5-1.5 MG/5ML syrup Take 5 mLs by mouth every 4 (four) hours as needed for cough. 120 mL 0   HYDROcodone-acetaminophen (NORCO/VICODIN) 5-325 MG tablet Take 1 tablet by mouth every 4 (four) hours as needed for moderate pain. Take w/ food. 40 tablet 0   levETIRAcetam (KEPPRA) 500 MG tablet Take 1 tablet (500 mg total) by mouth 2 (two) times daily. 60 tablet 2   levocetirizine (XYZAL) 5 MG tablet Take 5 mg by mouth at bedtime.     levothyroxine (SYNTHROID) 88 MCG tablet Take 1 tablet (88 mcg total) by mouth daily. (Patient taking differently: Take 88 mcg by mouth daily before breakfast.) 90 tablet 3   lisinopril (ZESTRIL) 5 MG tablet Take 1 tablet (5 mg total) by mouth every morning. 90 tablet 1   ondansetron (ZOFRAN ODT) 4 MG disintegrating tablet Take 1 tablet (4 mg total) by mouth every 6 (six) hours as needed for nausea or  vomiting. 90 tablet 1   ondansetron (ZOFRAN) 8 MG tablet Take 1 tablet (8 mg total) by mouth 3 (three) times daily as needed (nausea and vomiting). May take 30-60 minutes prior to Temodar administration if nausea/vomiting occurs. 30 tablet 1   pravastatin (PRAVACHOL) 20 MG tablet Take 1 tablet (20 mg total) by mouth at bedtime. 90 tablet 1   sertraline (ZOLOFT) 100 MG tablet TAKE 2 TABLETS BY MOUTH EVERY DAY 180 tablet 1   solifenacin (VESICARE) 10 MG tablet Take 10 mg by mouth at bedtime.  Spacer/Aero-Holding Chambers (AEROCHAMBER PLUS) inhaler Use as instructed 1 each 0   temozolomide (TEMODAR) 140 MG capsule Take 1 capsule (140 mg total) by mouth daily. (Take in addition to 20 mg capsule). May take on an empty stomach to decrease nausea & vomiting. 42 capsule 0   temozolomide (TEMODAR) 20 MG capsule Take 1 capsule (20 mg total) by mouth daily. (Take in addition to 140 mg capsule). May take on an empty stomach to decrease nausea & vomiting. 42 capsule 0   vitamin B-12 (CYANOCOBALAMIN) 1000 MCG tablet Take 1,000 mcg by mouth every morning.     zonisamide (ZONEGRAN) 100 MG capsule Take 2 capsules twice a day (Patient taking differently: Take 200 mg by mouth 2 (two) times daily. Seizure medication) 360 capsule 3   No current facility-administered medications for this visit.    Allergies:   Lamotrigine, Bupropion, Carbamazepine, Levetiracetam, Tramadol, Adhesive [tape], Amitriptyline, Clarithromycin, Doxycycline, Nickel, Other, Topamax [topiramate], Estradiol, Latex, and Phenytoin sodium extended    Social History:  The patient  reports that she quit smoking about 42 years ago. Her smoking use included cigarettes. She has a 5.00 pack-year smoking history. She has never used smokeless tobacco. She reports that she does not drink alcohol and does not use drugs.   Family History:  The patient's ***family history includes Aortic dissection in her father; Arthritis in her mother; Heart murmur in an  other family member; Stroke (age of onset: 68) in her mother; Throat cancer in her sister.    ROS:  Please see the history of present illness.   Otherwise, review of systems are positive for {NONE DEFAULTED:18576}.   All other systems are reviewed and negative.    PHYSICAL EXAM: VS:  There were no vitals taken for this visit. , BMI There is no height or weight on file to calculate BMI. GENERAL:  Well appearing HEENT:  Pupils equal round and reactive, fundi not visualized, oral mucosa unremarkable NECK:  No jugular venous distention, waveform within normal limits, carotid upstroke brisk and symmetric, no bruits, no thyromegaly LYMPHATICS:  No cervical, inguinal adenopathy LUNGS:  Clear to auscultation bilaterally BACK:  No CVA tenderness CHEST:  Unremarkable HEART:  PMI not displaced or sustained,S1 and S2 within normal limits, no S3, no S4, no clicks, no rubs, *** murmurs ABD:  Flat, positive bowel sounds normal in frequency in pitch, no bruits, no rebound, no guarding, no midline pulsatile mass, no hepatomegaly, no splenomegaly EXT:  2 plus pulses throughout, no edema, no cyanosis no clubbing SKIN:  No rashes no nodules NEURO:  Cranial nerves II through XII grossly intact, motor grossly intact throughout PSYCH:  Cognitively intact, oriented to person place and time    EKG:  EKG {ACTION; IS/IS PYK:[id number]} ordered today. The ekg ordered today demonstrates ***   Recent Labs: 02/16/2021: B Natriuretic Peptide 203.0 02/26/2021: TSH 0.819 02/27/2021: Magnesium 2.2 03/07/2021: ALT 13; BUN 13; Creatinine, Ser 0.89; Hemoglobin 12.9; Platelets 183; Potassium 4.3; Sodium 141    Lipid Panel    Component Value Date/Time   CHOL 172 03/19/2020 1301   TRIG 123 03/19/2020 1301   HDL 50 03/19/2020 1301   CHOLHDL 3.4 03/19/2020 1301   VLDL 30.2 12/30/2018 0932   LDLCALC 100 (H) 03/19/2020 1301   LDLDIRECT 162.6 01/12/2013 0909      Wt Readings from Last 3 Encounters:  03/29/21 233 lb  12.8 oz (106.1 kg)  03/18/21 234 lb 3.2 oz (106.2 kg)  03/07/21 229 lb 8 oz (104.1 kg)  Other studies Reviewed: Additional studies/ records that were reviewed today include: ***. Review of the above records demonstrates:  Please see elsewhere in the note.  ***   ASSESSMENT AND PLAN:  ATRIAL FIB:  ***   Current medicines are reviewed at length with the patient today.  The patient {ACTIONS; HAS/DOES NOT HAVE:19233} concerns regarding medicines.  The following changes have been made:  {PLAN; NO CHANGE:13088:s}  Labs/ tests ordered today include: *** No orders of the defined types were placed in this encounter.    Disposition:   FU with ***    Signed, Minus Breeding, MD  04/23/2021 8:49 PM    [facility]

## 2021-04-24 ENCOUNTER — Ambulatory Visit
Admission: RE | Admit: 2021-04-24 | Discharge: 2021-04-24 | Disposition: A | Discharge status: Home / Self Care | Payer: Medicare HMO | Source: Ambulatory Visit | Attending: Radiation Oncology | Admitting: Radiation Oncology

## 2021-04-24 DIAGNOSIS — C713 Malignant neoplasm of parietal lobe: Secondary | ICD-10-CM | POA: Diagnosis not present

## 2021-04-24 DIAGNOSIS — Z510 Encounter for antineoplastic radiation therapy: Secondary | ICD-10-CM | POA: Diagnosis not present

## 2021-04-25 ENCOUNTER — Ambulatory Visit: Payer: Medicare HMO | Admitting: Cardiology

## 2021-04-25 ENCOUNTER — Ambulatory Visit
Admission: RE | Admit: 2021-04-25 | Discharge: 2021-04-25 | Disposition: A | Discharge status: Home / Self Care | Payer: Medicare HMO | Source: Ambulatory Visit | Attending: Radiation Oncology | Admitting: Radiation Oncology

## 2021-04-25 ENCOUNTER — Other Ambulatory Visit: Payer: Self-pay

## 2021-04-25 ENCOUNTER — Encounter: Payer: Self-pay | Admitting: Cardiology

## 2021-04-25 VITALS — BP 104/64 | HR 80 | Ht 65.0 in | Wt 234.0 lb

## 2021-04-25 DIAGNOSIS — I480 Paroxysmal atrial fibrillation: Secondary | ICD-10-CM

## 2021-04-25 DIAGNOSIS — I10 Essential (primary) hypertension: Secondary | ICD-10-CM | POA: Diagnosis not present

## 2021-04-25 DIAGNOSIS — Z510 Encounter for antineoplastic radiation therapy: Secondary | ICD-10-CM | POA: Diagnosis not present

## 2021-04-25 DIAGNOSIS — C713 Malignant neoplasm of parietal lobe: Secondary | ICD-10-CM | POA: Diagnosis not present

## 2021-04-25 NOTE — Progress Notes (Signed)
Cardiology Office Note   Date:  04/25/2021   ID:  Breanah, Faddis 1945/08/10, MRN [id number]  PCP:  Caren Macadam, MD  Cardiologist:   Minus Breeding, MD   Chief Complaint  Patient presents with   Atrial Fibrillation      History of Present Illness: Beverly Ballard is a 76 y.o. female who presents for follow up of PAF.  This was noted when she had surgery for a primary brain tumor.  Echo was unremarkable. She wore a monitor and had NSR.  There was no evidence of atrial fib.  She had runs of SVT.     The patient is in the midst of radiation therapy.  She has not had any ongoing cardiovascular symptoms.  She has had no palpitations, presyncope or syncope.  She has balance issues now since her surgery and is getting around slowly with a walker.  She would not think that she was in fibrillation at all.  Has been no documented tachyarrhythmias with any of her engagements with other providers.   Past Medical History:  Diagnosis Date   Allergy    Anemia    Anxiety and depression 12/19/2009   Arthritis    Cancer ([place])    vaginal   Degenerative disorder of eye    Diverticulosis    Dysrhythmia 01/2021   Atrial fib/flutter   Essential hypertension, benign 06/26/2009   GERD 05/27/2007   takes Nexium daily   History of gout    History of migraine many yrs ago   Hyperlipidemia    takes Pravastatin daily   Hypothyroidism    Insomnia    but doesn't take any meds   Osteoporosis    Seizures ([place])    last 1983, none since then- last seizure 1988 per pt    Sleep apnea    wears CPAP    Past Surgical History:  Procedure Laterality Date   ADENOIDECTOMY     APPLICATION OF CRANIAL NAVIGATION N/A 02/22/2021   Procedure: APPLICATION OF CRANIAL NAVIGATION;  Surgeon: Eustace Moore, MD;  Location: [place];  Service: Neurosurgery;  Laterality: N/A;   BACK SURGERY     lumbar x2   BRONCHIAL BIOPSY  09/12/2019   Procedure: BRONCHIAL BIOPSIES;  Surgeon: Laurin Coder, MD;   [facility];  Service: Endoscopy;;   BRONCHIAL WASHINGS  09/12/2019   Procedure: BRONCHIAL WASHINGS;  Surgeon: Laurin Coder, MD;  [facility];  Service: Endoscopy;;   COLONOSCOPY  2009   CRANIOTOMY N/A 02/22/2021   Procedure: CRANIOTOMY FOR TUMOR EXCISION;  Surgeon: Eustace Moore, MD;  Location: [place];  Service: Neurosurgery;  Laterality: N/A;   ESOPHAGOGASTRODUODENOSCOPY     Heel spurs Bilateral    HEMOSTASIS CONTROL  09/12/2019   Procedure: HEMOSTASIS CONTROL;  Surgeon: Laurin Coder, MD;  [facility];  Service: Endoscopy;;  epi   KNEE ARTHROPLASTY Left 11/07/2013   Procedure: COMPUTER ASSISTED TOTAL KNEE ARTHROPLASTY;  Surgeon: Marybelle Killings, MD;  Location: [place];  Service: Orthopedics;  Laterality: Left;  Left Total Knee Arthroplasty, Cemented, Computer Assist   TONSILLECTOMY     TOTAL KNEE REVISION Left 09/19/2014   Procedure: LEFT TOTAL KNEE REVISION;  Surgeon: Leandrew Koyanagi, MD;  Location: [place];  Service: Orthopedics;  Laterality: Left;   UPPER GASTROINTESTINAL ENDOSCOPY     VIDEO BRONCHOSCOPY N/A 09/12/2019   Procedure: VIDEO BRONCHOSCOPY WITH FLUORO;  Surgeon: Laurin Coder, MD;  [facility];  Service:  Endoscopy;  Laterality: N/A;     Current Outpatient Medications  Medication Sig Dispense Refill   albuterol (VENTOLIN HFA) 108 (90 Base) MCG/ACT inhaler Inhale 1-2 puffs into the lungs every 6 (six) hours as needed for wheezing or shortness of breath. 8 g 0   benzonatate (TESSALON) 100 MG capsule Take 1 capsule (100 mg total) by mouth every 8 (eight) hours. 21 capsule 0   Budeson-Glycopyrrol-Formoterol (BREZTRI AEROSPHERE) 160-9-4.8 MCG/ACT AERO Inhale 2 puffs into the lungs 2 (two) times daily. 5.9 g 0   busPIRone (BUSPAR) 10 MG tablet TAKE 1 TABLET BY MOUTH TWICE A DAY (Patient taking differently: Take 10 mg by mouth 2 (two) times daily.) 180 tablet 0   cholecalciferol (VITAMIN D3) 25 MCG (1000 UNIT) tablet Take 1,000 Units by  mouth every morning.     dexamethasone (DECADRON) 4 MG tablet Take 0.5 tablets (2 mg total) by mouth daily. 30 tablet 0   dexamethasone (DECADRON) 4 MG tablet Take 1 tablet (4 mg total) by mouth daily. Take 4 mg daily for 3 days, then take 2 mg thereafter, daily 30 tablet 0   EPINEPHrine 0.3 mg/0.3 mL IJ SOAJ injection Inject 0.3 mg into the muscle once as needed for anaphylaxis.     esomeprazole (NEXIUM) 20 MG capsule Take 2 capsules (40 mg total) by mouth daily before breakfast. (Patient taking differently: Take 20 mg by mouth daily before breakfast.) 2 capsule 0   ferrous sulfate 325 (65 FE) MG tablet Take 325 mg by mouth 3 (three) times a week.     furosemide (LASIX) 20 MG tablet Take 1 tablet (20 mg total) by mouth every morning. 90 tablet 1   HYDROcodone-acetaminophen (NORCO/VICODIN) 5-325 MG tablet Take 1 tablet by mouth every 4 (four) hours as needed for moderate pain. Take w/ food. 40 tablet 0   levETIRAcetam (KEPPRA) 500 MG tablet Take 1 tablet (500 mg total) by mouth 2 (two) times daily. 60 tablet 2   levocetirizine (XYZAL) 5 MG tablet Take 5 mg by mouth at bedtime.     levothyroxine (SYNTHROID) 88 MCG tablet Take 1 tablet (88 mcg total) by mouth daily. (Patient taking differently: Take 88 mcg by mouth daily before breakfast.) 90 tablet 3   lisinopril (ZESTRIL) 5 MG tablet Take 1 tablet (5 mg total) by mouth every morning. 90 tablet 1   ondansetron (ZOFRAN ODT) 4 MG disintegrating tablet Take 1 tablet (4 mg total) by mouth every 6 (six) hours as needed for nausea or vomiting. 90 tablet 1   ondansetron (ZOFRAN) 8 MG tablet Take 1 tablet (8 mg total) by mouth 3 (three) times daily as needed (nausea and vomiting). May take 30-60 minutes prior to Temodar administration if nausea/vomiting occurs. 30 tablet 1   pravastatin (PRAVACHOL) 20 MG tablet Take 1 tablet (20 mg total) by mouth at bedtime. 90 tablet 1   sertraline (ZOLOFT) 100 MG tablet TAKE 2 TABLETS BY MOUTH EVERY DAY 180 tablet 1    solifenacin (VESICARE) 10 MG tablet Take 10 mg by mouth at bedtime.     Spacer/Aero-Holding Chambers (AEROCHAMBER PLUS) inhaler Use as instructed 1 each 0   temozolomide (TEMODAR) 140 MG capsule Take 1 capsule (140 mg total) by mouth daily. (Take in addition to 20 mg capsule). May take on an empty stomach to decrease nausea & vomiting. 42 capsule 0   temozolomide (TEMODAR) 20 MG capsule Take 1 capsule (20 mg total) by mouth daily. (Take in addition to 140 mg capsule). May take on an  empty stomach to decrease nausea & vomiting. 42 capsule 0   vitamin B-12 (CYANOCOBALAMIN) 1000 MCG tablet Take 1,000 mcg by mouth every morning.     zonisamide (ZONEGRAN) 100 MG capsule Take 2 capsules twice a day (Patient taking differently: Take 200 mg by mouth 2 (two) times daily. Seizure medication) 360 capsule 3   HYDROcodone bit-homatropine (HYCODAN) 5-1.5 MG/5ML syrup Take 5 mLs by mouth every 4 (four) hours as needed for cough. (Patient not taking: Reported on 04/25/2021) 120 mL 0   No current facility-administered medications for this visit.    Allergies:   Lamotrigine, Bupropion, Carbamazepine, Levetiracetam, Tramadol, Adhesive [tape], Amitriptyline, Clarithromycin, Doxycycline, Nickel, Other, Topamax [topiramate], Estradiol, Latex, and Phenytoin sodium extended    ROS:  Please see the history of present illness.   Otherwise, review of systems are positive for none.   All other systems are reviewed and negative.    PHYSICAL EXAM: VS:  BP 104/64    Pulse 80    Ht 5\' 5"  (1.651 m)    Wt 234 lb (106.1 kg)    SpO2 96%    BMI 38.94 kg/m  , BMI Body mass index is 38.94 kg/m. GEN:  No distress, chronically ill-appearing NECK:  No jugular venous distention at 90 degrees, waveform within normal limits, carotid upstroke brisk and symmetric, no bruits, no thyromegaly LYMPHATICS:  No cervical adenopathy LUNGS:  Clear to auscultation bilaterally BACK:  No CVA tenderness CHEST:  Unremarkable HEART:  S1 and S2 within  normal limits, no S3, no S4, no clicks, no rubs, no murmurs ABD:  Positive bowel sounds normal in frequency in pitch, no bruits, no rebound, no guarding, unable to assess midline mass or bruit with the patient seated. EXT:  2 plus pulses throughout, no edema, no cyanosis no clubbing SKIN:  No rashes no nodules NEURO:  Cranial nerves II through XII grossly intact, motor grossly intact throughout PSYCH:  Cognitively intact, oriented to person place and time    EKG:  EKG is ordered today. The ekg ordered today demonstrates sinus rhythm, rate 80, axis within normal limits, intervals within normal limits, poor anterior R wave progression likely related to lead placement   Recent Labs: 02/16/2021: B Natriuretic Peptide 203.0 02/26/2021: TSH 0.819 02/27/2021: Magnesium 2.2 03/07/2021: ALT 13; BUN 13; Creatinine, Ser 0.89; Hemoglobin 12.9; Platelets 183; Potassium 4.3; Sodium 141    Lipid Panel    Component Value Date/Time   CHOL 172 03/19/2020 1301   TRIG 123 03/19/2020 1301   HDL 50 03/19/2020 1301   CHOLHDL 3.4 03/19/2020 1301   VLDL 30.2 12/30/2018 0932   LDLCALC 100 (H) 03/19/2020 1301   LDLDIRECT 162.6 01/12/2013 0909      Wt Readings from Last 3 Encounters:  04/25/21 234 lb (106.1 kg)  03/29/21 233 lb 12.8 oz (106.1 kg)  03/18/21 234 lb 3.2 oz (106.2 kg)      Other studies Reviewed: Additional studies/ records that were reviewed today include: None. Review of the above records demonstrates:  Please see elsewhere in the note.     ASSESSMENT AND PLAN:  ATRIAL FIB: The patient has had no further paroxysms of atrial fibrillation documented.  Monitor demonstrated no atrial fibrillation.  She would be high risk for anticoagulation.  No change in therapy or further evaluation is warranted.  I did discuss with him how to potentially track this with a Kardia device with pulse oximeter.   Current medicines are reviewed at length with the patient today.  The patient does  not have  concerns regarding medicines.  The following changes have been made:  no change  Labs/ tests ordered today include: None  Orders Placed This Encounter  Procedures   EKG 12-Lead     Disposition:   FU with me as needed   Signed, Minus Breeding, MD  04/25/2021 5:19 PM    [place]

## 2021-04-25 NOTE — Patient Instructions (Signed)
Medication Instructions:  Your Physician recommend you continue on your current medication as directed.    *If you need a refill on your cardiac medications before your next appointment, please call your pharmacy*  Follow-Up: At [facility], you and your health needs are our priority.  As part of our continuing mission to provide you with exceptional heart care, we have created designated Provider Care Teams.  These Care Teams include your primary Cardiologist (physician) and Advanced Practice Providers (APPs -  Physician Assistants and Nurse Practitioners) who all work together to provide you with the care you need, when you need it.  We recommend signing up for the patient portal called "MyChart".  Sign up information is provided on this After Visit Summary.  MyChart is used to connect with patients for Virtual Visits (Telemedicine).  Patients are able to view lab/test results, encounter notes, upcoming appointments, etc.  Non-urgent messages can be sent to your provider as well.   To learn more about what you can do with MyChart, go to [email or web address].    Your next appointment:    As needed.  The format for your next appointment:   In Person  Provider:   Minus Breeding, MD

## 2021-04-26 ENCOUNTER — Ambulatory Visit
Admission: RE | Admit: 2021-04-26 | Discharge: 2021-04-26 | Disposition: A | Discharge status: Home / Self Care | Payer: Medicare HMO | Source: Ambulatory Visit | Attending: Radiation Oncology | Admitting: Radiation Oncology

## 2021-04-26 DIAGNOSIS — Z510 Encounter for antineoplastic radiation therapy: Secondary | ICD-10-CM | POA: Diagnosis not present

## 2021-04-26 DIAGNOSIS — C713 Malignant neoplasm of parietal lobe: Secondary | ICD-10-CM | POA: Diagnosis not present

## 2021-04-29 ENCOUNTER — Inpatient Hospital Stay: Payer: Medicare HMO | Attending: Internal Medicine | Admitting: Internal Medicine

## 2021-04-29 ENCOUNTER — Other Ambulatory Visit: Payer: Self-pay

## 2021-04-29 ENCOUNTER — Other Ambulatory Visit: Payer: Medicare HMO | Admitting: *Deleted

## 2021-04-29 ENCOUNTER — Ambulatory Visit
Admission: RE | Admit: 2021-04-29 | Discharge: 2021-04-29 | Disposition: A | Discharge status: Home / Self Care | Payer: Medicare HMO | Source: Ambulatory Visit | Attending: Radiation Oncology | Admitting: Radiation Oncology

## 2021-04-29 ENCOUNTER — Inpatient Hospital Stay: Payer: Medicare HMO

## 2021-04-29 VITALS — Resp 17 | Wt 222.0 lb

## 2021-04-29 DIAGNOSIS — E039 Hypothyroidism, unspecified: Secondary | ICD-10-CM | POA: Diagnosis not present

## 2021-04-29 DIAGNOSIS — G40909 Epilepsy, unspecified, not intractable, without status epilepticus: Secondary | ICD-10-CM | POA: Diagnosis not present

## 2021-04-29 DIAGNOSIS — R110 Nausea: Secondary | ICD-10-CM | POA: Insufficient documentation

## 2021-04-29 DIAGNOSIS — I959 Hypotension, unspecified: Secondary | ICD-10-CM | POA: Insufficient documentation

## 2021-04-29 DIAGNOSIS — C719 Malignant neoplasm of brain, unspecified: Secondary | ICD-10-CM

## 2021-04-29 DIAGNOSIS — Z79899 Other long term (current) drug therapy: Secondary | ICD-10-CM | POA: Insufficient documentation

## 2021-04-29 DIAGNOSIS — Z87891 Personal history of nicotine dependence: Secondary | ICD-10-CM | POA: Insufficient documentation

## 2021-04-29 DIAGNOSIS — Z510 Encounter for antineoplastic radiation therapy: Secondary | ICD-10-CM | POA: Diagnosis not present

## 2021-04-29 DIAGNOSIS — C713 Malignant neoplasm of parietal lobe: Secondary | ICD-10-CM | POA: Insufficient documentation

## 2021-04-29 LAB — CBC WITH DIFFERENTIAL (CANCER CENTER ONLY)
Abs Immature Granulocytes: 0.05 10*3/uL (ref 0.00–0.07)
Basophils Absolute: 0 10*3/uL (ref 0.0–0.1)
Basophils Relative: 0 %
Eosinophils Absolute: 0 10*3/uL (ref 0.0–0.5)
Eosinophils Relative: 0 %
HCT: 40.9 % (ref 36.0–46.0)
Hemoglobin: 13.3 g/dL (ref 12.0–15.0)
Immature Granulocytes: 1 %
Lymphocytes Relative: 14 %
Lymphs Abs: 1.3 10*3/uL (ref 0.7–4.0)
MCH: 31.7 pg (ref 26.0–34.0)
MCHC: 32.5 g/dL (ref 30.0–36.0)
MCV: 97.4 fL (ref 80.0–100.0)
Monocytes Absolute: 0.8 10*3/uL (ref 0.1–1.0)
Monocytes Relative: 8 %
Neutro Abs: 7.2 10*3/uL (ref 1.7–7.7)
Neutrophils Relative %: 77 %
Platelet Count: 219 10*3/uL (ref 150–400)
RBC: 4.2 MIL/uL (ref 3.87–5.11)
RDW: 13.8 % (ref 11.5–15.5)
WBC Count: 9.3 10*3/uL (ref 4.0–10.5)
nRBC: 0 % (ref 0.0–0.2)

## 2021-04-29 LAB — CMP (CANCER CENTER ONLY)
ALT: 11 U/L (ref 0–44)
AST: 15 U/L (ref 15–41)
Albumin: 3.7 g/dL (ref 3.5–5.0)
Alkaline Phosphatase: 65 U/L (ref 38–126)
Anion gap: 6 (ref 5–15)
BUN: 12 mg/dL (ref 8–23)
CO2: 30 mmol/L (ref 22–32)
Calcium: 9.3 mg/dL (ref 8.9–10.3)
Chloride: 103 mmol/L (ref 98–111)
Creatinine: 1 mg/dL (ref 0.44–1.00)
GFR, Estimated: 59 mL/min — ABNORMAL LOW (ref >60)
Glucose, Bld: 103 mg/dL — ABNORMAL HIGH (ref 70–99)
Potassium: 4.2 mmol/L (ref 3.5–5.1)
Sodium: 139 mmol/L (ref 135–145)
Total Bilirubin: 0.4 mg/dL (ref 0.3–1.2)
Total Protein: 7.1 g/dL (ref 6.5–8.1)

## 2021-04-29 NOTE — Progress Notes (Signed)
Signed wig prosthesis form faxed to [place] [phone]). Received confirmation notice that fax went through successfully

## 2021-04-29 NOTE — Progress Notes (Signed)
[facility] [place], [place] [address] [phone]   Interval Evaluation  Date of Service: 04/29/21 Patient Name: Beverly Ballard Patient MRN: [id number] Patient DOB: 1945/10/19 Provider: Ventura Sellers, MD  Identifying Statement:  Beverly Ballard is a 76 y.o. female with left parietal  high grade glioma, IDH-1 wild type    Oncologic History: Oncology History  High grade glioma not classifiable by WHO criteria ([place])  02/17/2021 Initial Diagnosis   High grade glioma not classifiable by WHO criteria ([place])   02/22/2021 Cancer Staging   Staging form: Brain and Spinal Cord, AJCC 8th Edition - Pathologic stage from 02/22/2021: WHO Grade IV - Signed by Ventura Sellers, MD on 03/18/2021 Stage prefix: Initial diagnosis Histologic grading system: 4 grade system Extent of surgical resection: Subtotal resection Solitary (s) or multifocal (m) tumors in the primary site: Solitary Tumor location in brain: Eloquent brain area Karnofsky performance status: Score 80 Seizures at presentation: Present Duration of symptoms before diagnosis: Short IDH1 mutation: Negative    04/08/2021 -  Chemotherapy   Patient is on Treatment Plan : BRAIN GLIOBLASTOMA Radiation Therapy With Concurrent Temozolomide 75 mg/m2 Daily Followed By Sequential Maintenance Temozolomide x 6-12 cycles       Biomarkers:  MGMT Unknown.  IDH 1/2 Wild type.  EGFR Unknown  TERT Unknown   Interval History: Beverly Ballard presents for follow up today, now having completed 3 weeks of IMRT and Temodar.  She continues to experience difficulty with short term memory, language expression, balance/gait, and overall energy.  She is frustrated by ways in which she falls short.   H+P (03/18/21) Patient presented to medical attention at the end of October 2022 with new type of seizure event, described as "shaking all over, lost consciousness".  She carries history of epilepsy going  back to childhood, had been stable on Zonisamide for several years.  CNS imgaging demonstrated L parietal mass.  She underwent craniotomy and debulking resection with Dr. Ronnald Ramp on 02/22/21; path demonstrated high grade glioma, IDH-1 wt.  Since surgery she has been rehospitalized for further breakthrough seizures, now on both Keppra and Zonegran.  She was also recently treated for recurrent pneumonia with oral antibiotics.  At present, she feels very impaired with regards to cognition, comprehension of language, and balance.  She is ambulating with a walker or wheelchair, also limited by long standing orthopedic issues.  She has recurrent nausea and shortness of breath which are both chronic issues.  Lives at home with her husband who also has cancer.  Medications: Current Outpatient Medications on File Prior to Visit  Medication Sig Dispense Refill   albuterol (VENTOLIN HFA) 108 (90 Base) MCG/ACT inhaler Inhale 1-2 puffs into the lungs every 6 (six) hours as needed for wheezing or shortness of breath. 8 g 0   benzonatate (TESSALON) 100 MG capsule Take 1 capsule (100 mg total) by mouth every 8 (eight) hours. 21 capsule 0   Budeson-Glycopyrrol-Formoterol (BREZTRI AEROSPHERE) 160-9-4.8 MCG/ACT AERO Inhale 2 puffs into the lungs 2 (two) times daily. 5.9 g 0   busPIRone (BUSPAR) 10 MG tablet TAKE 1 TABLET BY MOUTH TWICE A DAY (Patient taking differently: Take 10 mg by mouth 2 (two) times daily.) 180 tablet 0   cholecalciferol (VITAMIN D3) 25 MCG (1000 UNIT) tablet Take 1,000 Units by mouth every morning.     dexamethasone (DECADRON) 4 MG tablet Take 0.5 tablets (2 mg total) by mouth daily. 30 tablet 0  dexamethasone (DECADRON) 4 MG tablet Take 1 tablet (4 mg total) by mouth daily. Take 4 mg daily for 3 days, then take 2 mg thereafter, daily 30 tablet 0   EPINEPHrine 0.3 mg/0.3 mL IJ SOAJ injection Inject 0.3 mg into the muscle once as needed for anaphylaxis.     esomeprazole (NEXIUM) 20 MG capsule Take 2  capsules (40 mg total) by mouth daily before breakfast. (Patient taking differently: Take 20 mg by mouth daily before breakfast.) 2 capsule 0   ferrous sulfate 325 (65 FE) MG tablet Take 325 mg by mouth 3 (three) times a week.     furosemide (LASIX) 20 MG tablet Take 1 tablet (20 mg total) by mouth every morning. 90 tablet 1   HYDROcodone bit-homatropine (HYCODAN) 5-1.5 MG/5ML syrup Take 5 mLs by mouth every 4 (four) hours as needed for cough. (Patient not taking: Reported on 04/25/2021) 120 mL 0   HYDROcodone-acetaminophen (NORCO/VICODIN) 5-325 MG tablet Take 1 tablet by mouth every 4 (four) hours as needed for moderate pain. Take w/ food. 40 tablet 0   levETIRAcetam (KEPPRA) 500 MG tablet Take 1 tablet (500 mg total) by mouth 2 (two) times daily. 60 tablet 2   levocetirizine (XYZAL) 5 MG tablet Take 5 mg by mouth at bedtime.     levothyroxine (SYNTHROID) 88 MCG tablet Take 1 tablet (88 mcg total) by mouth daily. (Patient taking differently: Take 88 mcg by mouth daily before breakfast.) 90 tablet 3   lisinopril (ZESTRIL) 5 MG tablet Take 1 tablet (5 mg total) by mouth every morning. 90 tablet 1   ondansetron (ZOFRAN ODT) 4 MG disintegrating tablet Take 1 tablet (4 mg total) by mouth every 6 (six) hours as needed for nausea or vomiting. 90 tablet 1   ondansetron (ZOFRAN) 8 MG tablet Take 1 tablet (8 mg total) by mouth 3 (three) times daily as needed (nausea and vomiting). May take 30-60 minutes prior to Temodar administration if nausea/vomiting occurs. 30 tablet 1   pravastatin (PRAVACHOL) 20 MG tablet Take 1 tablet (20 mg total) by mouth at bedtime. 90 tablet 1   sertraline (ZOLOFT) 100 MG tablet TAKE 2 TABLETS BY MOUTH EVERY DAY 180 tablet 1   solifenacin (VESICARE) 10 MG tablet Take 10 mg by mouth at bedtime.     Spacer/Aero-Holding Chambers (AEROCHAMBER PLUS) inhaler Use as instructed 1 each 0   temozolomide (TEMODAR) 140 MG capsule Take 1 capsule (140 mg total) by mouth daily. (Take in addition to  20 mg capsule). May take on an empty stomach to decrease nausea & vomiting. 42 capsule 0   temozolomide (TEMODAR) 20 MG capsule Take 1 capsule (20 mg total) by mouth daily. (Take in addition to 140 mg capsule). May take on an empty stomach to decrease nausea & vomiting. 42 capsule 0   vitamin B-12 (CYANOCOBALAMIN) 1000 MCG tablet Take 1,000 mcg by mouth every morning.     zonisamide (ZONEGRAN) 100 MG capsule Take 2 capsules twice a day (Patient taking differently: Take 200 mg by mouth 2 (two) times daily. Seizure medication) 360 capsule 3   No current facility-administered medications on file prior to visit.    Allergies:  Allergies  Allergen Reactions   Lamotrigine Swelling    Tongue swelling   Bupropion Other (See Comments)    Unknown reaction   Carbamazepine Other (See Comments)    Hypnatremia   Levetiracetam Other (See Comments)    dizziness   Tramadol Other (See Comments)    Unknown reaction  Adhesive [Tape] Other (See Comments)    ?  Blister after knee surgery   Amitriptyline Nausea And Vomiting   Clarithromycin Other (See Comments)    Unknown reaction     Doxycycline Other (See Comments)    Interfered with seizure medication   Nickel Other (See Comments)    Had to remove knee implant with nickel and replace it   Other Other (See Comments)    Allergic to Metal and perfumes (unknown reaction)   Topamax [Topiramate] Nausea And Vomiting   Estradiol Rash   Latex Rash   Phenytoin Sodium Extended Other (See Comments)    drowsiness   Past Medical History:  Past Medical History:  Diagnosis Date   Allergy    Anemia    Anxiety and depression 12/19/2009   Arthritis    Cancer ([place])    vaginal   Degenerative disorder of eye    Diverticulosis    Dysrhythmia 01/2021   Atrial fib/flutter   Essential hypertension, benign 06/26/2009   GERD 05/27/2007   takes Nexium daily   History of gout    History of migraine many yrs ago   Hyperlipidemia    takes Pravastatin daily    Hypothyroidism    Insomnia    but doesn't take any meds   Osteoporosis    Seizures ([place])    last 1983, none since then- last seizure 1988 per pt    Sleep apnea    wears CPAP   Past Surgical History:  Past Surgical History:  Procedure Laterality Date   ADENOIDECTOMY     APPLICATION OF CRANIAL NAVIGATION N/A 02/22/2021   Procedure: APPLICATION OF CRANIAL NAVIGATION;  Surgeon: Eustace Moore, MD;  Location: [facility];  Service: Neurosurgery;  Laterality: N/A;   BACK SURGERY     lumbar x2   BRONCHIAL BIOPSY  09/12/2019   Procedure: BRONCHIAL BIOPSIES;  Surgeon: Laurin Coder, MD;  Location: [facility];  Service: Endoscopy;;   BRONCHIAL WASHINGS  09/12/2019   Procedure: BRONCHIAL WASHINGS;  Surgeon: Laurin Coder, MD;  Location: [facility];  Service: Endoscopy;;   COLONOSCOPY  2009   CRANIOTOMY N/A 02/22/2021   Procedure: CRANIOTOMY FOR TUMOR EXCISION;  Surgeon: Eustace Moore, MD;  Location: [facility];  Service: Neurosurgery;  Laterality: N/A;   ESOPHAGOGASTRODUODENOSCOPY     Heel spurs Bilateral    HEMOSTASIS CONTROL  09/12/2019   Procedure: HEMOSTASIS CONTROL;  Surgeon: Laurin Coder, MD;  Location: [facility];  Service: Endoscopy;;  epi   KNEE ARTHROPLASTY Left 11/07/2013   Procedure: COMPUTER ASSISTED TOTAL KNEE ARTHROPLASTY;  Surgeon: Marybelle Killings, MD;  Location: [facility];  Service: Orthopedics;  Laterality: Left;  Left Total Knee Arthroplasty, Cemented, Computer Assist   TONSILLECTOMY     TOTAL KNEE REVISION Left 09/19/2014   Procedure: LEFT TOTAL KNEE REVISION;  Surgeon: Leandrew Koyanagi, MD;  Location: [facility];  Service: Orthopedics;  Laterality: Left;   UPPER GASTROINTESTINAL ENDOSCOPY     VIDEO BRONCHOSCOPY N/A 09/12/2019   Procedure: VIDEO BRONCHOSCOPY WITH FLUORO;  Surgeon: Laurin Coder, MD;  Location: [facility];  Service: Endoscopy;  Laterality: N/A;   Social History:  Social History   Socioeconomic History   Marital status: Married    Spouse name: Not on  file   Number of children: 1   Years of education: Not on file   Highest education level: 12th grade  Occupational History   Occupation: Retired  Tobacco Use   Smoking status: Former  Packs/day: 0.25    Years: 20.00    Pack years: 5.00    Types: Cigarettes    Quit date: 09/08/1978    Years since quitting: 42.6   Smokeless tobacco: Never   Tobacco comments:    quit smoking 40yr ago  Vaping Use   Vaping Use: Never used  Substance and Sexual Activity   Alcohol use: No   Drug use: No   Sexual activity: Not Currently    Birth control/protection: Post-menopausal  Other Topics Concern   Not on file  Social History Narrative   Right handed    Lives with husband    Social Determinants of Health   Financial Resource Strain: Low Risk    Difficulty of Paying Living Expenses: Not very hard  Food Insecurity: Unknown   Worried About RCharity fundraiserin the Last Year: Patient refused   RArboriculturistin the Last Year: Never true  Transportation Needs: No Transportation Needs   Lack of Transportation (Medical): No   Lack of Transportation (Non-Medical): No  Physical Activity: Unknown   Days of Exercise per Week: 1 day   Minutes of Exercise per Session: Patient refused  Stress: Stress Concern Present   Feeling of Stress : Very much  Social Connections: Moderately Isolated   Frequency of Communication with Friends and Family: Three times a week   Frequency of Social Gatherings with Friends and Family: Three times a week   Attends Religious Services: Never   Active Member of Clubs or Organizations: No   Attends CArchivistMeetings: Never   Marital Status: Married  IHuman resources officerViolence: Not At Risk   Fear of Current or Ex-Partner: No   Emotionally Abused: No   Physically Abused: No   Sexually Abused: No   Family History:  Family History  Problem Relation Age of Onset   Aortic dissection Father    Arthritis Mother    Stroke Mother 840  Heart murmur Other     Throat cancer Sister    Colon cancer Neg Hx    Esophageal cancer Neg Hx    Stomach cancer Neg Hx    Rectal cancer Neg Hx    Colon polyps Neg Hx     Review of Systems: Constitutional: Doesn't report fevers, chills or abnormal weight loss Eyes: Doesn't report blurriness of vision Ears, nose, mouth, throat, and face: Doesn't report sore throat Respiratory: Doesn't report cough, dyspnea or wheezes Cardiovascular: Doesn't report palpitation, chest discomfort  Gastrointestinal:  Doesn't report nausea, constipation, diarrhea GU: Doesn't report incontinence Skin: Doesn't report skin rashes Neurological: Per HPI Musculoskeletal: Doesn't report joint pain Behavioral/Psych: Doesn't report anxiety  Physical Exam: Vitals:   04/29/21 1029  Resp: 17   KPS: 70 General: Alert, cooperative, pleasant, in no acute distress Head: Normal EENT: No conjunctival injection or scleral icterus.  Lungs: Resp effort normal Cardiac: Regular rate Abdomen: Non-distended abdomen Skin: No rashes cyanosis or petechiae. Extremities: No clubbing or edema  Neurologic Exam: Mental Status: Awake, alert, attentive to examiner. Oriented to self and environment. Language is fluent with intact comprehension.  Advanced pscyhomotor slowing, mood lability noted. Cranial Nerves: Visual acuity is grossly normal. Visual fields are full. Extra-ocular movements intact. No ptosis. Face is symmetric Motor: Tone and bulk are normal. Power is full in both arms and legs. Reflexes are symmetric, no pathologic reflexes present.  Sensory: Intact to light touch Gait: Dystaxic   Labs: I have reviewed the data as listed    Component  Value Date/Time   NA 141 03/07/2021 1240   NA 142 11/12/2015 0000   K 4.3 03/07/2021 1240   CL 105 03/07/2021 1240   CO2 29 03/07/2021 1240   GLUCOSE 120 (H) 03/07/2021 1240   BUN 13 03/07/2021 1240   BUN 8 11/12/2015 0000   CREATININE 0.89 03/07/2021 1240   CREATININE 0.72 03/19/2020 1301    CALCIUM 8.9 03/07/2021 1240   PROT 6.8 03/07/2021 1240   ALBUMIN 3.9 03/07/2021 1240   AST 14 (L) 03/07/2021 1240   ALT 13 03/07/2021 1240   ALKPHOS 81 03/07/2021 1240   BILITOT 0.3 03/07/2021 1240   GFRNONAA >60 03/07/2021 1240   GFRNONAA 80 10/05/2015 1105   GFRAA >60 12/20/2019 1714   GFRAA >89 10/05/2015 1105   Lab Results  Component Value Date   WBC 14.2 (H) 03/07/2021   NEUTROABS 11.9 (H) 03/07/2021   HGB 12.9 03/07/2021   HCT 40.0 03/07/2021   MCV 97.6 03/07/2021   PLT 183 03/07/2021     Assessment/Plan High grade glioma not classifiable by WHO criteria ([place])  Beverly Ballard is clinically stable today, now having completed 3 weeks of IMRT and Temozolomide.    We ultimately recommended continuing with course of intensity modulated radiation therapy and concurrent daily Temozolomide.  Radiation will be administered Mon-Fri over 6 weeks, Temodar will be dosed at 97m/m2 to be given daily over 42 days.    Chemotherapy should be held for the following:  ANC less than 1,000  Platelets less than 100,000  LFT or creatinine greater than 2x ULN  If clinical concerns/contraindications develop  Every 2 weeks during radiation, labs will be checked accompanied by a clinical evaluation in the brain tumor clinic.  Should con't Zonegran 1052mBID and Keppra 50077mID as prior.   All questions were answered. The patient knows to call the clinic with any problems, questions or concerns. No barriers to learning were detected.  The total time spent in the encounter was 30 minutes and more than 50% was on counseling and review of test results   ZacVentura SellersD Medical Director of Neuro-Oncology [facility] [place]/09/23 11:14 AM

## 2021-04-30 ENCOUNTER — Telehealth: Payer: Self-pay | Admitting: Internal Medicine

## 2021-04-30 ENCOUNTER — Ambulatory Visit
Admission: RE | Admit: 2021-04-30 | Discharge: 2021-04-30 | Disposition: A | Discharge status: Home / Self Care | Payer: Medicare HMO | Source: Ambulatory Visit | Attending: Radiation Oncology | Admitting: Radiation Oncology

## 2021-04-30 DIAGNOSIS — C713 Malignant neoplasm of parietal lobe: Secondary | ICD-10-CM | POA: Diagnosis not present

## 2021-04-30 DIAGNOSIS — Z510 Encounter for antineoplastic radiation therapy: Secondary | ICD-10-CM | POA: Diagnosis not present

## 2021-04-30 NOTE — Telephone Encounter (Signed)
Sch per 19 inbasket

## 2021-05-01 ENCOUNTER — Other Ambulatory Visit: Payer: Self-pay

## 2021-05-01 ENCOUNTER — Ambulatory Visit
Admission: RE | Admit: 2021-05-01 | Discharge: 2021-05-01 | Disposition: A | Discharge status: Home / Self Care | Payer: Medicare HMO | Source: Ambulatory Visit | Attending: Radiation Oncology | Admitting: Radiation Oncology

## 2021-05-01 DIAGNOSIS — Z483 Aftercare following surgery for neoplasm: Secondary | ICD-10-CM | POA: Diagnosis not present

## 2021-05-01 DIAGNOSIS — M810 Age-related osteoporosis without current pathological fracture: Secondary | ICD-10-CM | POA: Diagnosis not present

## 2021-05-01 DIAGNOSIS — I119 Hypertensive heart disease without heart failure: Secondary | ICD-10-CM | POA: Diagnosis not present

## 2021-05-01 DIAGNOSIS — G40409 Other generalized epilepsy and epileptic syndromes, not intractable, without status epilepticus: Secondary | ICD-10-CM | POA: Diagnosis not present

## 2021-05-01 DIAGNOSIS — J479 Bronchiectasis, uncomplicated: Secondary | ICD-10-CM | POA: Diagnosis not present

## 2021-05-01 DIAGNOSIS — R69 Illness, unspecified: Secondary | ICD-10-CM | POA: Diagnosis not present

## 2021-05-01 DIAGNOSIS — Z510 Encounter for antineoplastic radiation therapy: Secondary | ICD-10-CM | POA: Diagnosis not present

## 2021-05-01 DIAGNOSIS — I4892 Unspecified atrial flutter: Secondary | ICD-10-CM | POA: Diagnosis not present

## 2021-05-01 DIAGNOSIS — I480 Paroxysmal atrial fibrillation: Secondary | ICD-10-CM | POA: Diagnosis not present

## 2021-05-01 DIAGNOSIS — D496 Neoplasm of unspecified behavior of brain: Secondary | ICD-10-CM | POA: Diagnosis not present

## 2021-05-01 DIAGNOSIS — C713 Malignant neoplasm of parietal lobe: Secondary | ICD-10-CM | POA: Diagnosis not present

## 2021-05-02 ENCOUNTER — Ambulatory Visit
Admission: RE | Admit: 2021-05-02 | Discharge: 2021-05-02 | Disposition: A | Discharge status: Home / Self Care | Payer: Medicare HMO | Source: Ambulatory Visit | Attending: Radiation Oncology | Admitting: Radiation Oncology

## 2021-05-02 DIAGNOSIS — Z510 Encounter for antineoplastic radiation therapy: Secondary | ICD-10-CM | POA: Diagnosis not present

## 2021-05-02 DIAGNOSIS — C713 Malignant neoplasm of parietal lobe: Secondary | ICD-10-CM | POA: Diagnosis not present

## 2021-05-03 ENCOUNTER — Other Ambulatory Visit: Payer: Self-pay

## 2021-05-03 ENCOUNTER — Ambulatory Visit
Admission: RE | Admit: 2021-05-03 | Discharge: 2021-05-03 | Disposition: A | Discharge status: Home / Self Care | Payer: Medicare HMO | Source: Ambulatory Visit | Attending: Radiation Oncology | Admitting: Radiation Oncology

## 2021-05-03 DIAGNOSIS — C713 Malignant neoplasm of parietal lobe: Secondary | ICD-10-CM | POA: Diagnosis not present

## 2021-05-03 DIAGNOSIS — Z510 Encounter for antineoplastic radiation therapy: Secondary | ICD-10-CM | POA: Diagnosis not present

## 2021-05-06 ENCOUNTER — Other Ambulatory Visit: Payer: Self-pay

## 2021-05-06 ENCOUNTER — Ambulatory Visit
Admission: RE | Admit: 2021-05-06 | Discharge: 2021-05-06 | Disposition: A | Discharge status: Home / Self Care | Payer: Medicare HMO | Source: Ambulatory Visit | Attending: Radiation Oncology | Admitting: Radiation Oncology

## 2021-05-06 DIAGNOSIS — Z510 Encounter for antineoplastic radiation therapy: Secondary | ICD-10-CM | POA: Diagnosis not present

## 2021-05-06 DIAGNOSIS — C713 Malignant neoplasm of parietal lobe: Secondary | ICD-10-CM | POA: Diagnosis not present

## 2021-05-07 ENCOUNTER — Ambulatory Visit: Payer: Medicare HMO

## 2021-05-08 ENCOUNTER — Other Ambulatory Visit: Payer: Self-pay

## 2021-05-08 ENCOUNTER — Ambulatory Visit
Admission: RE | Admit: 2021-05-08 | Discharge: 2021-05-08 | Disposition: A | Discharge status: Home / Self Care | Payer: Medicare HMO | Source: Ambulatory Visit | Attending: Radiation Oncology | Admitting: Radiation Oncology

## 2021-05-08 DIAGNOSIS — C713 Malignant neoplasm of parietal lobe: Secondary | ICD-10-CM | POA: Diagnosis not present

## 2021-05-08 DIAGNOSIS — Z510 Encounter for antineoplastic radiation therapy: Secondary | ICD-10-CM | POA: Diagnosis not present

## 2021-05-09 ENCOUNTER — Ambulatory Visit
Admission: RE | Admit: 2021-05-09 | Discharge: 2021-05-09 | Disposition: A | Discharge status: Home / Self Care | Payer: Medicare HMO | Source: Ambulatory Visit | Attending: Radiation Oncology | Admitting: Radiation Oncology

## 2021-05-09 DIAGNOSIS — C713 Malignant neoplasm of parietal lobe: Secondary | ICD-10-CM | POA: Diagnosis not present

## 2021-05-09 DIAGNOSIS — Z510 Encounter for antineoplastic radiation therapy: Secondary | ICD-10-CM | POA: Diagnosis not present

## 2021-05-10 ENCOUNTER — Ambulatory Visit
Admission: RE | Admit: 2021-05-10 | Discharge: 2021-05-10 | Disposition: A | Discharge status: Home / Self Care | Payer: Medicare HMO | Source: Ambulatory Visit | Attending: Radiation Oncology | Admitting: Radiation Oncology

## 2021-05-10 ENCOUNTER — Other Ambulatory Visit: Payer: Self-pay

## 2021-05-10 DIAGNOSIS — Z510 Encounter for antineoplastic radiation therapy: Secondary | ICD-10-CM | POA: Diagnosis not present

## 2021-05-10 DIAGNOSIS — C713 Malignant neoplasm of parietal lobe: Secondary | ICD-10-CM | POA: Diagnosis not present

## 2021-05-13 ENCOUNTER — Other Ambulatory Visit: Payer: Self-pay

## 2021-05-13 ENCOUNTER — Other Ambulatory Visit: Payer: Self-pay | Admitting: Radiation Oncology

## 2021-05-13 ENCOUNTER — Inpatient Hospital Stay: Payer: Medicare HMO

## 2021-05-13 ENCOUNTER — Ambulatory Visit
Admission: RE | Admit: 2021-05-13 | Discharge: 2021-05-13 | Disposition: A | Discharge status: Home / Self Care | Payer: Medicare HMO | Source: Ambulatory Visit | Attending: Radiation Oncology | Admitting: Radiation Oncology

## 2021-05-13 ENCOUNTER — Inpatient Hospital Stay: Payer: Medicare HMO | Admitting: Internal Medicine

## 2021-05-13 VITALS — BP 101/36 | HR 69 | Temp 97.0°F | Resp 18

## 2021-05-13 DIAGNOSIS — C713 Malignant neoplasm of parietal lobe: Secondary | ICD-10-CM | POA: Diagnosis not present

## 2021-05-13 DIAGNOSIS — E039 Hypothyroidism, unspecified: Secondary | ICD-10-CM | POA: Diagnosis not present

## 2021-05-13 DIAGNOSIS — C719 Malignant neoplasm of brain, unspecified: Secondary | ICD-10-CM

## 2021-05-13 DIAGNOSIS — I959 Hypotension, unspecified: Secondary | ICD-10-CM | POA: Diagnosis not present

## 2021-05-13 DIAGNOSIS — Z510 Encounter for antineoplastic radiation therapy: Secondary | ICD-10-CM | POA: Diagnosis not present

## 2021-05-13 DIAGNOSIS — Z87891 Personal history of nicotine dependence: Secondary | ICD-10-CM | POA: Diagnosis not present

## 2021-05-13 DIAGNOSIS — G40909 Epilepsy, unspecified, not intractable, without status epilepticus: Secondary | ICD-10-CM | POA: Diagnosis not present

## 2021-05-13 DIAGNOSIS — R110 Nausea: Secondary | ICD-10-CM | POA: Diagnosis not present

## 2021-05-13 DIAGNOSIS — Z79899 Other long term (current) drug therapy: Secondary | ICD-10-CM | POA: Diagnosis not present

## 2021-05-13 LAB — CBC WITH DIFFERENTIAL (CANCER CENTER ONLY)
Abs Immature Granulocytes: 0.02 10*3/uL (ref 0.00–0.07)
Basophils Absolute: 0 10*3/uL (ref 0.0–0.1)
Basophils Relative: 0 %
Eosinophils Absolute: 0 10*3/uL (ref 0.0–0.5)
Eosinophils Relative: 1 %
HCT: 40.9 % (ref 36.0–46.0)
Hemoglobin: 13.8 g/dL (ref 12.0–15.0)
Immature Granulocytes: 0 %
Lymphocytes Relative: 15 %
Lymphs Abs: 1 10*3/uL (ref 0.7–4.0)
MCH: 32.4 pg (ref 26.0–34.0)
MCHC: 33.7 g/dL (ref 30.0–36.0)
MCV: 96 fL (ref 80.0–100.0)
Monocytes Absolute: 0.6 10*3/uL (ref 0.1–1.0)
Monocytes Relative: 9 %
Neutro Abs: 5 10*3/uL (ref 1.7–7.7)
Neutrophils Relative %: 75 %
Platelet Count: 172 10*3/uL (ref 150–400)
RBC: 4.26 MIL/uL (ref 3.87–5.11)
RDW: 14.5 % (ref 11.5–15.5)
WBC Count: 6.6 10*3/uL (ref 4.0–10.5)
nRBC: 0 % (ref 0.0–0.2)

## 2021-05-13 LAB — CMP (CANCER CENTER ONLY)
ALT: 11 U/L (ref 0–44)
AST: 18 U/L (ref 15–41)
Albumin: 3.9 g/dL (ref 3.5–5.0)
Alkaline Phosphatase: 62 U/L (ref 38–126)
Anion gap: 5 (ref 5–15)
BUN: 12 mg/dL (ref 8–23)
CO2: 30 mmol/L (ref 22–32)
Calcium: 9.6 mg/dL (ref 8.9–10.3)
Chloride: 105 mmol/L (ref 98–111)
Creatinine: 1.02 mg/dL — ABNORMAL HIGH (ref 0.44–1.00)
GFR, Estimated: 58 mL/min — ABNORMAL LOW (ref >60)
Glucose, Bld: 112 mg/dL — ABNORMAL HIGH (ref 70–99)
Potassium: 4.2 mmol/L (ref 3.5–5.1)
Sodium: 140 mmol/L (ref 135–145)
Total Bilirubin: 0.4 mg/dL (ref 0.3–1.2)
Total Protein: 7.2 g/dL (ref 6.5–8.1)

## 2021-05-13 MED ORDER — ONDANSETRON HCL 8 MG PO TABS: 8.0000 mg | ORAL_TABLET | Freq: Three times a day (TID) | ORAL | 3 refills | Status: DC | PRN

## 2021-05-13 MED ORDER — PROCHLORPERAZINE MALEATE 10 MG PO TABS: 10.0000 mg | ORAL_TABLET | Freq: Four times a day (QID) | ORAL | 3 refills | Status: DC | PRN

## 2021-05-13 NOTE — Progress Notes (Signed)
[facility] [place] [place], [place] [address] [phone]   Interval Evaluation  Date of Service: 05/13/21 Patient Name: Beverly Ballard Patient MRN: [id number] Patient DOB: [birth year] Provider: Ventura Sellers, MD  Identifying Statement:  Beverly Ballard is a 76 y.o. female with left parietal  high grade glioma, IDH-1 wild type    Oncologic History: Oncology History  High grade glioma not classifiable by WHO criteria ([place])  02/17/2021 Initial Diagnosis   High grade glioma not classifiable by WHO criteria ([place])   02/22/2021 Cancer Staging   Staging form: Brain and Spinal Cord, AJCC 8th Edition - Pathologic stage from 02/22/2021: WHO Grade IV - Signed by Ventura Sellers, MD on 03/18/2021 Stage prefix: Initial diagnosis Histologic grading system: 4 grade system Extent of surgical resection: Subtotal resection Solitary (s) or multifocal (m) tumors in the primary site: Solitary Tumor location in brain: Eloquent brain area Karnofsky performance status: Score 80 Seizures at presentation: Present Duration of symptoms before diagnosis: Short IDH1 mutation: Negative    04/08/2021 -  Radiation Therapy   IMRT and concurrent Temodar with Dr. Isidore Moos     Biomarkers:  MGMT Unknown.  IDH 1/2 Wild type.  EGFR Unknown  TERT Unknown   Interval History: Beverly Ballard presents for follow up today, now having completed 4 weeks of IMRT and Temodar.  Ongoing issues short term memory, language expression, balance/gait, and overall energy.  Continues to experience anxiety, frustration at her situation.  Nausea has been more of an issue the past week or two.   H+P (03/18/21) Patient presented to medical attention at the end of October 2022 with new type of seizure event, described as "shaking all over, lost consciousness".  She carries history of epilepsy going back to childhood, had been stable on Zonisamide for several years.  CNS imgaging  demonstrated L parietal mass.  She underwent craniotomy and debulking resection with Dr. Ronnald Ramp on 02/22/21; path demonstrated high grade glioma, IDH-1 wt.  Since surgery she has been rehospitalized for further breakthrough seizures, now on both Keppra and Zonegran.  She was also recently treated for recurrent pneumonia with oral antibiotics.  At present, she feels very impaired with regards to cognition, comprehension of language, and balance.  She is ambulating with a walker or wheelchair, also limited by long standing orthopedic issues.  She has recurrent nausea and shortness of breath which are both chronic issues.  Lives at home with her husband who also has cancer.  Medications: Current Outpatient Medications on File Prior to Visit  Medication Sig Dispense Refill   busPIRone (BUSPAR) 10 MG tablet TAKE 1 TABLET BY MOUTH TWICE A DAY (Patient taking differently: Take 10 mg by mouth 2 (two) times daily.) 180 tablet 0   cholecalciferol (VITAMIN D3) 25 MCG (1000 UNIT) tablet Take 1,000 Units by mouth every morning.     esomeprazole (NEXIUM) 20 MG capsule Take 2 capsules (40 mg total) by mouth daily before breakfast. (Patient taking differently: Take 20 mg by mouth daily before breakfast.) 2 capsule 0   ferrous sulfate 325 (65 FE) MG tablet Take 325 mg by mouth 3 (three) times a week.     furosemide (LASIX) 20 MG tablet Take 1 tablet (20 mg total) by mouth every morning. 90 tablet 1   HYDROcodone-acetaminophen (NORCO/VICODIN) 5-325 MG tablet Take 1 tablet by mouth every 4 (four) hours as needed for moderate pain. Take w/ food. 40 tablet 0   levETIRAcetam (KEPPRA)  500 MG tablet Take 1 tablet (500 mg total) by mouth 2 (two) times daily. 60 tablet 2   levocetirizine (XYZAL) 5 MG tablet Take 5 mg by mouth at bedtime.     levothyroxine (SYNTHROID) 88 MCG tablet Take 1 tablet (88 mcg total) by mouth daily. (Patient taking differently: Take 88 mcg by mouth daily before breakfast.) 90 tablet 3   lisinopril  (ZESTRIL) 5 MG tablet Take 1 tablet (5 mg total) by mouth every morning. 90 tablet 1   ondansetron (ZOFRAN) 8 MG tablet Take 1 tablet (8 mg total) by mouth 3 (three) times daily as needed (nausea and vomiting). May take 30-60 minutes prior to Temodar administration if nausea/vomiting occurs. 30 tablet 1   pravastatin (PRAVACHOL) 20 MG tablet Take 1 tablet (20 mg total) by mouth at bedtime. 90 tablet 1   sertraline (ZOLOFT) 100 MG tablet TAKE 2 TABLETS BY MOUTH EVERY DAY 180 tablet 1   solifenacin (VESICARE) 10 MG tablet Take 10 mg by mouth at bedtime.     temozolomide (TEMODAR) 140 MG capsule Take 1 capsule (140 mg total) by mouth daily. (Take in addition to 20 mg capsule). May take on an empty stomach to decrease nausea & vomiting. 42 capsule 0   temozolomide (TEMODAR) 20 MG capsule Take 1 capsule (20 mg total) by mouth daily. (Take in addition to 140 mg capsule). May take on an empty stomach to decrease nausea & vomiting. 42 capsule 0   vitamin B-12 (CYANOCOBALAMIN) 1000 MCG tablet Take 1,000 mcg by mouth every morning.     zonisamide (ZONEGRAN) 100 MG capsule Take 2 capsules twice a day (Patient taking differently: Take 200 mg by mouth 2 (two) times daily. Seizure medication) 360 capsule 3   albuterol (VENTOLIN HFA) 108 (90 Base) MCG/ACT inhaler Inhale 1-2 puffs into the lungs every 6 (six) hours as needed for wheezing or shortness of breath. (Patient not taking: Reported on 05/13/2021) 8 g 0   benzonatate (TESSALON) 100 MG capsule Take 1 capsule (100 mg total) by mouth every 8 (eight) hours. (Patient not taking: Reported on 05/13/2021) 21 capsule 0   Budeson-Glycopyrrol-Formoterol (BREZTRI AEROSPHERE) 160-9-4.8 MCG/ACT AERO Inhale 2 puffs into the lungs 2 (two) times daily. (Patient not taking: Reported on 05/13/2021) 5.9 g 0   EPINEPHrine 0.3 mg/0.3 mL IJ SOAJ injection Inject 0.3 mg into the muscle once as needed for anaphylaxis. (Patient not taking: Reported on 05/13/2021)     HYDROcodone  bit-homatropine (HYCODAN) 5-1.5 MG/5ML syrup Take 5 mLs by mouth every 4 (four) hours as needed for cough. (Patient not taking: Reported on 04/25/2021) 120 mL 0   ondansetron (ZOFRAN ODT) 4 MG disintegrating tablet Take 1 tablet (4 mg total) by mouth every 6 (six) hours as needed for nausea or vomiting. (Patient not taking: Reported on 05/13/2021) 90 tablet 1   Spacer/Aero-Holding Chambers (AEROCHAMBER PLUS) inhaler Use as instructed (Patient not taking: Reported on 05/13/2021) 1 each 0   No current facility-administered medications on file prior to visit.    Allergies:  Allergies  Allergen Reactions   Lamotrigine Swelling    Tongue swelling   Bupropion Other (See Comments)    Unknown reaction   Carbamazepine Other (See Comments)    Hypnatremia   Levetiracetam Other (See Comments)    dizziness   Tramadol Other (See Comments)    Unknown reaction   Adhesive [Tape] Other (See Comments)    ?  Blister after knee surgery   Amitriptyline Nausea And Vomiting   Clarithromycin Other (  See Comments)    Unknown reaction     Doxycycline Other (See Comments)    Interfered with seizure medication   Nickel Other (See Comments)    Had to remove knee implant with nickel and replace it   Other Other (See Comments)    Allergic to Metal and perfumes (unknown reaction)   Topamax [Topiramate] Nausea And Vomiting   Estradiol Rash   Latex Rash   Phenytoin Sodium Extended Other (See Comments)    drowsiness   Past Medical History:  Past Medical History:  Diagnosis Date   Allergy    Anemia    Anxiety and depression 12/19/2009   Arthritis    Cancer ([place])    vaginal   Degenerative disorder of eye    Diverticulosis    Dysrhythmia 01/2021   Atrial fib/flutter   Essential hypertension, benign 06/26/2009   GERD 05/27/2007   takes Nexium daily   History of gout    History of migraine many yrs ago   Hyperlipidemia    takes Pravastatin daily   Hypothyroidism    Insomnia    but doesn't take any  meds   Osteoporosis    Seizures ([place])    last 1983, none since then- last seizure 1988 per pt    Sleep apnea    wears CPAP   Past Surgical History:  Past Surgical History:  Procedure Laterality Date   ADENOIDECTOMY     APPLICATION OF CRANIAL NAVIGATION N/A 02/22/2021   Procedure: APPLICATION OF CRANIAL NAVIGATION;  Surgeon: Eustace Moore, MD;  Location: [facility];  Service: Neurosurgery;  Laterality: N/A;   BACK SURGERY     lumbar x2   BRONCHIAL BIOPSY  09/12/2019   Procedure: BRONCHIAL BIOPSIES;  Surgeon: Laurin Coder, MD;  Location: [facility];  Service: Endoscopy;;   BRONCHIAL WASHINGS  09/12/2019   Procedure: BRONCHIAL WASHINGS;  Surgeon: Laurin Coder, MD;  Location: [facility];  Service: Endoscopy;;   COLONOSCOPY  2009   CRANIOTOMY N/A 02/22/2021   Procedure: CRANIOTOMY FOR TUMOR EXCISION;  Surgeon: Eustace Moore, MD;  Location: [facility];  Service: Neurosurgery;  Laterality: N/A;   ESOPHAGOGASTRODUODENOSCOPY     Heel spurs Bilateral    HEMOSTASIS CONTROL  09/12/2019   Procedure: HEMOSTASIS CONTROL;  Surgeon: Laurin Coder, MD;  Location: [facility];  Service: Endoscopy;;  epi   KNEE ARTHROPLASTY Left 11/07/2013   Procedure: COMPUTER ASSISTED TOTAL KNEE ARTHROPLASTY;  Surgeon: Marybelle Killings, MD;  Location: [facility];  Service: Orthopedics;  Laterality: Left;  Left Total Knee Arthroplasty, Cemented, Computer Assist   TONSILLECTOMY     TOTAL KNEE REVISION Left 09/19/2014   Procedure: LEFT TOTAL KNEE REVISION;  Surgeon: Leandrew Koyanagi, MD;  Location: [facility];  Service: Orthopedics;  Laterality: Left;   UPPER GASTROINTESTINAL ENDOSCOPY     VIDEO BRONCHOSCOPY N/A 09/12/2019   Procedure: VIDEO BRONCHOSCOPY WITH FLUORO;  Surgeon: Laurin Coder, MD;  Location: [facility];  Service: Endoscopy;  Laterality: N/A;   Social History:  Social History   Socioeconomic History   Marital status: Married    Spouse name: Not on file   Number of children: 1   Years of education: Not  on file   Highest education level: 12th grade  Occupational History   Occupation: Retired  Tobacco Use   Smoking status: Former    Packs/day: 0.25    Years: 20.00    Pack years: 5.00    Types: Cigarettes    Quit date: 09/08/1978  Years since quitting: 42.7   Smokeless tobacco: Never   Tobacco comments:    quit smoking 6yr ago  Vaping Use   Vaping Use: Never used  Substance and Sexual Activity   Alcohol use: No   Drug use: No   Sexual activity: Not Currently    Birth control/protection: Post-menopausal  Other Topics Concern   Not on file  Social History Narrative   Right handed    Lives with husband    Social Determinants of Health   Financial Resource Strain: Low Risk    Difficulty of Paying Living Expenses: Not very hard  Food Insecurity: Unknown   Worried About RCharity fundraiserin the Last Year: Patient refused   RArboriculturistin the Last Year: Never true  Transportation Needs: No Transportation Needs   Lack of Transportation (Medical): No   Lack of Transportation (Non-Medical): No  Physical Activity: Unknown   Days of Exercise per Week: 1 day   Minutes of Exercise per Session: Patient refused  Stress: Stress Concern Present   Feeling of Stress : Very much  Social Connections: Moderately Isolated   Frequency of Communication with Friends and Family: Three times a week   Frequency of Social Gatherings with Friends and Family: Three times a week   Attends Religious Services: Never   Active Member of Clubs or Organizations: No   Attends CArchivistMeetings: Never   Marital Status: Married  IHuman resources officerViolence: Not At Risk   Fear of Current or Ex-Partner: No   Emotionally Abused: No   Physically Abused: No   Sexually Abused: No   Family History:  Family History  Problem Relation Age of Onset   Aortic dissection Father    Arthritis Mother    Stroke Mother 84  Heart murmur Other    Throat cancer Sister    Colon cancer Neg Hx     Esophageal cancer Neg Hx    Stomach cancer Neg Hx    Rectal cancer Neg Hx    Colon polyps Neg Hx     Review of Systems: Constitutional: Doesn't report fevers, chills or abnormal weight loss Eyes: Doesn't report blurriness of vision Ears, nose, mouth, throat, and face: Doesn't report sore throat Respiratory: Doesn't report cough, dyspnea or wheezes Cardiovascular: Doesn't report palpitation, chest discomfort  Gastrointestinal:  Doesn't report nausea, constipation, diarrhea GU: Doesn't report incontinence Skin: Doesn't report skin rashes Neurological: Per HPI Musculoskeletal: Doesn't report joint pain Behavioral/Psych: Doesn't report anxiety  Physical Exam: Vitals:   05/13/21 1138  BP: (!) 101/36  Pulse: 69  Resp: 18  Temp: (!) 97 F (36.1 C)  SpO2: 100%   KPS: 70 General: Alert, cooperative, pleasant, in no acute distress Head: Normal EENT: No conjunctival injection or scleral icterus.  Lungs: Resp effort normal Cardiac: Regular rate Abdomen: Non-distended abdomen Skin: No rashes cyanosis or petechiae. Extremities: No clubbing or edema  Neurologic Exam: Mental Status: Awake, alert, attentive to examiner. Oriented to self and environment. Language is fluent with intact comprehension.  Advanced pscyhomotor slowing, mood lability noted. Cranial Nerves: Visual acuity is grossly normal. Visual fields are full. Extra-ocular movements intact. No ptosis. Face is symmetric Motor: Tone and bulk are normal. Power is full in both arms and legs. Reflexes are symmetric, no pathologic reflexes present.  Sensory: Intact to light touch Gait: Dystaxic   Labs: I have reviewed the data as listed    Component Value Date/Time   NA 139 04/29/2021 1111  NA 142 11/12/2015 0000   K 4.2 04/29/2021 1111   CL 103 04/29/2021 1111   CO2 30 04/29/2021 1111   GLUCOSE 103 (H) 04/29/2021 1111   BUN 12 04/29/2021 1111   BUN 8 11/12/2015 0000   CREATININE 1.00 04/29/2021 1111   CREATININE 0.72  03/19/2020 1301   CALCIUM 9.3 04/29/2021 1111   PROT 7.1 04/29/2021 1111   ALBUMIN 3.7 04/29/2021 1111   AST 15 04/29/2021 1111   ALT 11 04/29/2021 1111   ALKPHOS 65 04/29/2021 1111   BILITOT 0.4 04/29/2021 1111   GFRNONAA 59 (L) 04/29/2021 1111   GFRNONAA 80 10/05/2015 1105   GFRAA >60 12/20/2019 1714   GFRAA >89 10/05/2015 1105   Lab Results  Component Value Date   WBC 6.6 05/13/2021   NEUTROABS 5.0 05/13/2021   HGB 13.8 05/13/2021   HCT 40.9 05/13/2021   MCV 96.0 05/13/2021   PLT 172 05/13/2021     Assessment/Plan High grade glioma not classifiable by WHO criteria ([place])  Seizure disorder ([place])  Beverly Ballard is clinically stable today, now having completed 4 weeks of IMRT and Temozolomide.    We ultimately recommended continuing with course of intensity modulated radiation therapy and concurrent daily Temozolomide.  Radiation will be administered Mon-Fri over 6 weeks, Temodar will be dosed at 60m/m2 to be given daily over 42 days.    Chemotherapy should be held for the following:  ANC less than 1,000  Platelets less than 100,000  LFT or creatinine greater than 2x ULN  If clinical concerns/contraindications develop  Every 2 weeks during radiation, labs will be checked accompanied by a clinical evaluation in the brain tumor clinic.  For nausea, will add-on copmazine 187mq6 PRN to zofran regimen.  We asked her to hold lisinopril until next visit given relative hypotension noted today.  Should con't Zonegran 10071mID and Keppra 500m37mD as prior.   All questions were answered. The patient knows to call the clinic with any problems, questions or concerns. No barriers to learning were detected.  The total time spent in the encounter was 30 minutes and more than 50% was on counseling and review of test results   ZachVentura Sellers Medical Director of Neuro-Oncology [facility]WeslJerusalem23/23 12:00 PM

## 2021-05-14 ENCOUNTER — Ambulatory Visit
Admission: RE | Admit: 2021-05-14 | Discharge: 2021-05-14 | Disposition: A | Discharge status: Home / Self Care | Payer: Medicare HMO | Source: Ambulatory Visit | Attending: Radiation Oncology | Admitting: Radiation Oncology

## 2021-05-14 DIAGNOSIS — Z510 Encounter for antineoplastic radiation therapy: Secondary | ICD-10-CM | POA: Diagnosis not present

## 2021-05-14 DIAGNOSIS — C713 Malignant neoplasm of parietal lobe: Secondary | ICD-10-CM | POA: Diagnosis not present

## 2021-05-15 ENCOUNTER — Ambulatory Visit: Payer: Medicare HMO

## 2021-05-16 ENCOUNTER — Ambulatory Visit
Admission: RE | Admit: 2021-05-16 | Discharge: 2021-05-16 | Disposition: A | Discharge status: Home / Self Care | Payer: Medicare HMO | Source: Ambulatory Visit | Attending: Radiation Oncology | Admitting: Radiation Oncology

## 2021-05-16 ENCOUNTER — Other Ambulatory Visit: Payer: Self-pay

## 2021-05-16 DIAGNOSIS — Z510 Encounter for antineoplastic radiation therapy: Secondary | ICD-10-CM | POA: Diagnosis not present

## 2021-05-16 DIAGNOSIS — C713 Malignant neoplasm of parietal lobe: Secondary | ICD-10-CM | POA: Diagnosis not present

## 2021-05-17 ENCOUNTER — Ambulatory Visit
Admission: RE | Admit: 2021-05-17 | Discharge: 2021-05-17 | Disposition: A | Discharge status: Home / Self Care | Payer: Medicare HMO | Source: Ambulatory Visit | Attending: Radiation Oncology | Admitting: Radiation Oncology

## 2021-05-17 ENCOUNTER — Other Ambulatory Visit: Payer: Self-pay | Admitting: Physician Assistant

## 2021-05-17 DIAGNOSIS — C713 Malignant neoplasm of parietal lobe: Secondary | ICD-10-CM | POA: Diagnosis not present

## 2021-05-17 DIAGNOSIS — Z510 Encounter for antineoplastic radiation therapy: Secondary | ICD-10-CM | POA: Diagnosis not present

## 2021-05-17 NOTE — Telephone Encounter (Signed)
Please schedule appt

## 2021-05-17 NOTE — Telephone Encounter (Signed)
FYI pt does not want to schedule appt

## 2021-05-17 NOTE — Telephone Encounter (Signed)
Patient doesn't wish to schedule right now.

## 2021-05-17 NOTE — Telephone Encounter (Signed)
Noted. She is going through treatment for brain tumor, so OV not absolutely necessary for now, and with this med.

## 2021-05-17 NOTE — Telephone Encounter (Signed)
Seen 10/26 due back 11/10

## 2021-05-20 ENCOUNTER — Other Ambulatory Visit: Payer: Self-pay

## 2021-05-20 ENCOUNTER — Ambulatory Visit
Admission: RE | Admit: 2021-05-20 | Discharge: 2021-05-20 | Disposition: A | Discharge status: Home / Self Care | Payer: Medicare HMO | Source: Ambulatory Visit | Attending: Radiation Oncology | Admitting: Radiation Oncology

## 2021-05-20 DIAGNOSIS — C713 Malignant neoplasm of parietal lobe: Secondary | ICD-10-CM

## 2021-05-20 DIAGNOSIS — Z510 Encounter for antineoplastic radiation therapy: Secondary | ICD-10-CM | POA: Diagnosis not present

## 2021-05-21 ENCOUNTER — Inpatient Hospital Stay: Payer: Medicare HMO | Admitting: Internal Medicine

## 2021-05-21 ENCOUNTER — Ambulatory Visit
Admission: RE | Admit: 2021-05-21 | Discharge: 2021-05-21 | Disposition: A | Discharge status: Home / Self Care | Payer: Medicare HMO | Source: Ambulatory Visit | Attending: Radiation Oncology | Admitting: Radiation Oncology

## 2021-05-21 ENCOUNTER — Inpatient Hospital Stay: Payer: Medicare HMO

## 2021-05-21 VITALS — BP 116/58 | HR 80 | Temp 97.7°F | Resp 18 | Ht 60.0 in | Wt 229.3 lb

## 2021-05-21 DIAGNOSIS — C719 Malignant neoplasm of brain, unspecified: Secondary | ICD-10-CM | POA: Diagnosis not present

## 2021-05-21 DIAGNOSIS — E039 Hypothyroidism, unspecified: Secondary | ICD-10-CM | POA: Diagnosis not present

## 2021-05-21 DIAGNOSIS — I959 Hypotension, unspecified: Secondary | ICD-10-CM | POA: Diagnosis not present

## 2021-05-21 DIAGNOSIS — G40909 Epilepsy, unspecified, not intractable, without status epilepticus: Secondary | ICD-10-CM

## 2021-05-21 DIAGNOSIS — Z79899 Other long term (current) drug therapy: Secondary | ICD-10-CM | POA: Diagnosis not present

## 2021-05-21 DIAGNOSIS — R110 Nausea: Secondary | ICD-10-CM | POA: Diagnosis not present

## 2021-05-21 DIAGNOSIS — C713 Malignant neoplasm of parietal lobe: Secondary | ICD-10-CM | POA: Diagnosis not present

## 2021-05-21 DIAGNOSIS — Z510 Encounter for antineoplastic radiation therapy: Secondary | ICD-10-CM | POA: Diagnosis not present

## 2021-05-21 DIAGNOSIS — Z87891 Personal history of nicotine dependence: Secondary | ICD-10-CM | POA: Diagnosis not present

## 2021-05-21 LAB — CBC WITH DIFFERENTIAL (CANCER CENTER ONLY)
Abs Immature Granulocytes: 0.01 10*3/uL (ref 0.00–0.07)
Basophils Absolute: 0 10*3/uL (ref 0.0–0.1)
Basophils Relative: 0 %
Eosinophils Absolute: 0 10*3/uL (ref 0.0–0.5)
Eosinophils Relative: 1 %
HCT: 41.6 % (ref 36.0–46.0)
Hemoglobin: 13.7 g/dL (ref 12.0–15.0)
Immature Granulocytes: 0 %
Lymphocytes Relative: 11 %
Lymphs Abs: 0.6 10*3/uL — ABNORMAL LOW (ref 0.7–4.0)
MCH: 32.1 pg (ref 26.0–34.0)
MCHC: 32.9 g/dL (ref 30.0–36.0)
MCV: 97.4 fL (ref 80.0–100.0)
Monocytes Absolute: 0.5 10*3/uL (ref 0.1–1.0)
Monocytes Relative: 9 %
Neutro Abs: 4.7 10*3/uL (ref 1.7–7.7)
Neutrophils Relative %: 79 %
Platelet Count: 162 10*3/uL (ref 150–400)
RBC: 4.27 MIL/uL (ref 3.87–5.11)
RDW: 14.1 % (ref 11.5–15.5)
WBC Count: 5.9 10*3/uL (ref 4.0–10.5)
nRBC: 0 % (ref 0.0–0.2)

## 2021-05-21 LAB — CMP (CANCER CENTER ONLY)
ALT: 13 U/L (ref 0–44)
AST: 22 U/L (ref 15–41)
Albumin: 4.1 g/dL (ref 3.5–5.0)
Alkaline Phosphatase: 58 U/L (ref 38–126)
Anion gap: 6 (ref 5–15)
BUN: 13 mg/dL (ref 8–23)
CO2: 31 mmol/L (ref 22–32)
Calcium: 9.4 mg/dL (ref 8.9–10.3)
Chloride: 102 mmol/L (ref 98–111)
Creatinine: 1.02 mg/dL — ABNORMAL HIGH (ref 0.44–1.00)
GFR, Estimated: 58 mL/min — ABNORMAL LOW (ref >60)
Glucose, Bld: 105 mg/dL — ABNORMAL HIGH (ref 70–99)
Potassium: 4.3 mmol/L (ref 3.5–5.1)
Sodium: 139 mmol/L (ref 135–145)
Total Bilirubin: 0.4 mg/dL (ref 0.3–1.2)
Total Protein: 7.3 g/dL (ref 6.5–8.1)

## 2021-05-21 NOTE — Progress Notes (Signed)
[place] [address],  [id number] [phone]   Interval Evaluation  Date of Service: 05/21/21 Patient Name: Beverly Ballard Patient MRN: [id number] Patient DOB: 03/13/1946 Provider: Ventura Sellers, MD  Identifying Statement:  Beverly Ballard is a 76 y.o. female with left parietal  high grade glioma, IDH-1 wild type    Oncologic History: Oncology History  High grade glioma not classifiable by WHO criteria ([place])  02/17/2021 Initial Diagnosis   High grade glioma not classifiable by WHO criteria ([place])   02/22/2021 Cancer Staging   Staging form: Brain and Spinal Cord, AJCC 8th Edition - Pathologic stage from 02/22/2021: WHO Grade IV - Signed by Ventura Sellers, MD on 03/18/2021 Stage prefix: Initial diagnosis Histologic grading system: 4 grade system Extent of surgical resection: Subtotal resection Solitary (s) or multifocal (m) tumors in the primary site: Solitary Tumor location in brain: Eloquent brain area Karnofsky performance status: Score 80 Seizures at presentation: Present Duration of symptoms before diagnosis: Short IDH1 mutation: Negative    04/08/2021 -  Radiation Therapy   IMRT and concurrent Temodar with Dr. Isidore Moos     Biomarkers:  MGMT Unknown.  IDH 1/2 Wild type.  EGFR Unknown  TERT Unknown   Interval History: Beverly Ballard presents for follow up today, now having completed 6 weeks of IMRT and Temodar.  Overall stability with regards to short term memory, language expression, balance/gait, and overall energy.  Continues to experience anxiety, frustration at her situation.  Nausea is still present but improved with compazine.  H+P (03/18/21) Patient presented to medical attention at the end of October 2022 with new type of seizure event, described as "shaking all over, lost consciousness".  She carries history of epilepsy going back to childhood, had been stable on Zonisamide for several years.  CNS  imgaging demonstrated L parietal mass.  She underwent craniotomy and debulking resection with Dr. Ronnald Ramp on 02/22/21; path demonstrated high grade glioma, IDH-1 wt.  Since surgery she has been rehospitalized for further breakthrough seizures, now on both Keppra and Zonegran.  She was also recently treated for recurrent pneumonia with oral antibiotics.  At present, she feels very impaired with regards to cognition, comprehension of language, and balance.  She is ambulating with a walker or wheelchair, also limited by long standing orthopedic issues.  She has recurrent nausea and shortness of breath which are both chronic issues.  Lives at home with her husband who also has cancer.  Medications: Current Outpatient Medications on File Prior to Visit  Medication Sig Dispense Refill   albuterol (VENTOLIN HFA) 108 (90 Base) MCG/ACT inhaler Inhale 1-2 puffs into the lungs every 6 (six) hours as needed for wheezing or shortness of breath. (Patient not taking: Reported on 05/13/2021) 8 g 0   benzonatate (TESSALON) 100 MG capsule Take 1 capsule (100 mg total) by mouth every 8 (eight) hours. (Patient not taking: Reported on 05/13/2021) 21 capsule 0   Budeson-Glycopyrrol-Formoterol (BREZTRI AEROSPHERE) 160-9-4.8 MCG/ACT AERO Inhale 2 puffs into the lungs 2 (two) times daily. (Patient not taking: Reported on 05/13/2021) 5.9 g 0   busPIRone (BUSPAR) 10 MG tablet TAKE 1 TABLET BY MOUTH TWICE A DAY 180 tablet 0   cholecalciferol (VITAMIN D3) 25 MCG (1000 UNIT) tablet Take 1,000 Units by mouth every morning.     EPINEPHrine 0.3 mg/0.3 mL IJ SOAJ injection Inject 0.3 mg into the muscle once as needed for anaphylaxis. (Patient not taking: Reported on 05/13/2021)  esomeprazole (NEXIUM) 20 MG capsule Take 2 capsules (40 mg total) by mouth daily before breakfast. (Patient taking differently: Take 20 mg by mouth daily before breakfast.) 2 capsule 0   ferrous sulfate 325 (65 FE) MG tablet Take 325 mg by mouth 3 (three) times a  week.     furosemide (LASIX) 20 MG tablet Take 1 tablet (20 mg total) by mouth every morning. 90 tablet 1   HYDROcodone bit-homatropine (HYCODAN) 5-1.5 MG/5ML syrup Take 5 mLs by mouth every 4 (four) hours as needed for cough. (Patient not taking: Reported on 04/25/2021) 120 mL 0   HYDROcodone-acetaminophen (NORCO/VICODIN) 5-325 MG tablet Take 1 tablet by mouth every 4 (four) hours as needed for moderate pain. Take w/ food. 40 tablet 0   levETIRAcetam (KEPPRA) 500 MG tablet Take 1 tablet (500 mg total) by mouth 2 (two) times daily. 60 tablet 2   levocetirizine (XYZAL) 5 MG tablet Take 5 mg by mouth at bedtime.     levothyroxine (SYNTHROID) 88 MCG tablet Take 1 tablet (88 mcg total) by mouth daily. (Patient taking differently: Take 88 mcg by mouth daily before breakfast.) 90 tablet 3   lisinopril (ZESTRIL) 5 MG tablet Take 1 tablet (5 mg total) by mouth every morning. 90 tablet 1   ondansetron (ZOFRAN) 8 MG tablet Take 1 tablet (8 mg total) by mouth 3 (three) times daily as needed (nausea and vomiting). May take 30-60 minutes prior to Temodar administration if nausea/vomiting occurs. 30 tablet 3   pravastatin (PRAVACHOL) 20 MG tablet Take 1 tablet (20 mg total) by mouth at bedtime. 90 tablet 1   prochlorperazine (COMPAZINE) 10 MG tablet Take 1 tablet (10 mg total) by mouth every 6 (six) hours as needed for nausea or vomiting. 30 tablet 3   sertraline (ZOLOFT) 100 MG tablet TAKE 2 TABLETS BY MOUTH EVERY DAY 180 tablet 1   solifenacin (VESICARE) 10 MG tablet Take 10 mg by mouth at bedtime.     Spacer/Aero-Holding Chambers (AEROCHAMBER PLUS) inhaler Use as instructed (Patient not taking: Reported on 05/13/2021) 1 each 0   temozolomide (TEMODAR) 140 MG capsule Take 1 capsule (140 mg total) by mouth daily. (Take in addition to 20 mg capsule). May take on an empty stomach to decrease nausea & vomiting. 42 capsule 0   temozolomide (TEMODAR) 20 MG capsule Take 1 capsule (20 mg total) by mouth daily. (Take in  addition to 140 mg capsule). May take on an empty stomach to decrease nausea & vomiting. 42 capsule 0   vitamin B-12 (CYANOCOBALAMIN) 1000 MCG tablet Take 1,000 mcg by mouth every morning.     zonisamide (ZONEGRAN) 100 MG capsule Take 2 capsules twice a day (Patient taking differently: Take 200 mg by mouth 2 (two) times daily. Seizure medication) 360 capsule 3   No current facility-administered medications on file prior to visit.    Allergies:  Allergies  Allergen Reactions   Lamotrigine Swelling    Tongue swelling   Bupropion Other (See Comments)    Unknown reaction   Carbamazepine Other (See Comments)    Hypnatremia   Levetiracetam Other (See Comments)    dizziness   Tramadol Other (See Comments)    Unknown reaction   Adhesive [Tape] Other (See Comments)    ?  Blister after knee surgery   Amitriptyline Nausea And Vomiting   Clarithromycin Other (See Comments)    Unknown reaction     Doxycycline Other (See Comments)    Interfered with seizure medication   Nickel Other (  See Comments)    Had to remove knee implant with nickel and replace it   Other Other (See Comments)    Allergic to Metal and perfumes (unknown reaction)   Topamax [Topiramate] Nausea And Vomiting   Estradiol Rash   Latex Rash   Phenytoin Sodium Extended Other (See Comments)    drowsiness   Past Medical History:  Past Medical History:  Diagnosis Date   Allergy    Anemia    Anxiety and depression 12/19/2009   Arthritis    Cancer ([place])    vaginal   Degenerative disorder of eye    Diverticulosis    Dysrhythmia 01/2021   Atrial fib/flutter   Essential hypertension, benign 06/26/2009   GERD 05/27/2007   takes Nexium daily   History of gout    History of migraine many yrs ago   Hyperlipidemia    takes Pravastatin daily   Hypothyroidism    Insomnia    but doesn't take any meds   Osteoporosis    Seizures ([place])    last 1983, none since then- last seizure 1988 per pt    Sleep apnea    wears CPAP    Past Surgical History:  Past Surgical History:  Procedure Laterality Date   ADENOIDECTOMY     APPLICATION OF CRANIAL NAVIGATION N/A 02/22/2021   Procedure: APPLICATION OF CRANIAL NAVIGATION;  Surgeon: Eustace Moore, MD;  Location: [facility];  Service: Neurosurgery;  Laterality: N/A;   BACK SURGERY     lumbar x2   BRONCHIAL BIOPSY  09/12/2019   Procedure: BRONCHIAL BIOPSIES;  Surgeon: Laurin Coder, MD;  Location: [facility];  Service: Endoscopy;;   BRONCHIAL WASHINGS  09/12/2019   Procedure: BRONCHIAL WASHINGS;  Surgeon: Laurin Coder, MD;  Location: [facility];  Service: Endoscopy;;   COLONOSCOPY  2009   CRANIOTOMY N/A 02/22/2021   Procedure: CRANIOTOMY FOR TUMOR EXCISION;  Surgeon: Eustace Moore, MD;  Location: [facility];  Service: Neurosurgery;  Laterality: N/A;   ESOPHAGOGASTRODUODENOSCOPY     Heel spurs Bilateral    HEMOSTASIS CONTROL  09/12/2019   Procedure: HEMOSTASIS CONTROL;  Surgeon: Laurin Coder, MD;  Location: [facility];  Service: Endoscopy;;  epi   KNEE ARTHROPLASTY Left 11/07/2013   Procedure: COMPUTER ASSISTED TOTAL KNEE ARTHROPLASTY;  Surgeon: Marybelle Killings, MD;  Location: [facility];  Service: Orthopedics;  Laterality: Left;  Left Total Knee Arthroplasty, Cemented, Computer Assist   TONSILLECTOMY     TOTAL KNEE REVISION Left 09/19/2014   Procedure: LEFT TOTAL KNEE REVISION;  Surgeon: Leandrew Koyanagi, MD;  Location: [facility];  Service: Orthopedics;  Laterality: Left;   UPPER GASTROINTESTINAL ENDOSCOPY     VIDEO BRONCHOSCOPY N/A 09/12/2019   Procedure: VIDEO BRONCHOSCOPY WITH FLUORO;  Surgeon: Laurin Coder, MD;  Location: [facility];  Service: Endoscopy;  Laterality: N/A;   Social History:  Social History   Socioeconomic History   Marital status: Married    Spouse name: Not on file   Number of children: 1   Years of education: Not on file   Highest education level: 12th grade  Occupational History   Occupation: Retired  Tobacco Use   Smoking status:  Former    Packs/day: 0.25    Years: 20.00    Pack years: 5.00    Types: Cigarettes    Quit date: 09/08/1978    Years since quitting: 42.7   Smokeless tobacco: Never   Tobacco comments:    quit smoking 41yr ago  Vaping Use  Vaping Use: Never used  Substance and Sexual Activity   Alcohol use: No   Drug use: No   Sexual activity: Not Currently    Birth control/protection: Post-menopausal  Other Topics Concern   Not on file  Social History Narrative   Right handed    Lives with husband    Social Determinants of Health   Financial Resource Strain: Low Risk    Difficulty of Paying Living Expenses: Not very hard  Food Insecurity: Unknown   Worried About Charity fundraiser in the Last Year: Patient refused   Arboriculturist in the Last Year: Never true  Transportation Needs: No Transportation Needs   Lack of Transportation (Medical): No   Lack of Transportation (Non-Medical): No  Physical Activity: Unknown   Days of Exercise per Week: 1 day   Minutes of Exercise per Session: Patient refused  Stress: Stress Concern Present   Feeling of Stress : Very much  Social Connections: Moderately Isolated   Frequency of Communication with Friends and Family: Three times a week   Frequency of Social Gatherings with Friends and Family: Three times a week   Attends Religious Services: Never   Active Member of Clubs or Organizations: No   Attends Archivist Meetings: Never   Marital Status: Married  Human resources officer Violence: Not At Risk   Fear of Current or Ex-Partner: No   Emotionally Abused: No   Physically Abused: No   Sexually Abused: No   Family History:  Family History  Problem Relation Age of Onset   Aortic dissection Father    Arthritis Mother    Stroke Mother 28   Heart murmur Other    Throat cancer Sister    Colon cancer Neg Hx    Esophageal cancer Neg Hx    Stomach cancer Neg Hx    Rectal cancer Neg Hx    Colon polyps Neg Hx     Review of  Systems: Constitutional: Doesn't report fevers, chills or abnormal weight loss Eyes: Doesn't report blurriness of vision Ears, nose, mouth, throat, and face: Doesn't report sore throat Respiratory: Doesn't report cough, dyspnea or wheezes Cardiovascular: Doesn't report palpitation, chest discomfort  Gastrointestinal:  Doesn't report nausea, constipation, diarrhea GU: Doesn't report incontinence Skin: Doesn't report skin rashes Neurological: Per HPI Musculoskeletal: Doesn't report joint pain Behavioral/Psych: Doesn't report anxiety  Physical Exam: Vitals:   05/21/21 0946  BP: (!) 116/58  Pulse: 80  Resp: 18  Temp: 97.7 F (36.5 C)  SpO2: 99%   KPS: 70 General: Alert, cooperative, pleasant, in no acute distress Head: Normal EENT: No conjunctival injection or scleral icterus.  Lungs: Resp effort normal Cardiac: Regular rate Abdomen: Non-distended abdomen Skin: No rashes cyanosis or petechiae. Extremities: No clubbing or edema  Neurologic Exam: Mental Status: Awake, alert, attentive to examiner. Oriented to self and environment. Language is fluent with intact comprehension.  Advanced pscyhomotor slowing, mood lability noted. Cranial Nerves: Visual acuity is grossly normal. Visual fields are full. Extra-ocular movements intact. No ptosis. Face is symmetric Motor: Tone and bulk are normal. Power is full in both arms and legs. Reflexes are symmetric, no pathologic reflexes present.  Sensory: Intact to light touch Gait: Dystaxic   Labs: I have reviewed the data as listed    Component Value Date/Time   NA 139 05/21/2021 0930   NA 142 11/12/2015 0000   K 4.3 05/21/2021 0930   CL 102 05/21/2021 0930   CO2 31 05/21/2021 0930   GLUCOSE 105 (  H) 05/21/2021 0930   BUN 13 05/21/2021 0930   BUN 8 11/12/2015 0000   CREATININE 1.02 (H) 05/21/2021 0930   CREATININE 0.72 03/19/2020 1301   CALCIUM 9.4 05/21/2021 0930   PROT 7.3 05/21/2021 0930   ALBUMIN 4.1 05/21/2021 0930   AST 22  05/21/2021 0930   ALT 13 05/21/2021 0930   ALKPHOS 58 05/21/2021 0930   BILITOT 0.4 05/21/2021 0930   GFRNONAA 58 (L) 05/21/2021 0930   GFRNONAA 80 10/05/2015 1105   GFRAA >60 12/20/2019 1714   GFRAA >89 10/05/2015 1105   Lab Results  Component Value Date   WBC 5.9 05/21/2021   NEUTROABS 4.7 05/21/2021   HGB 13.7 05/21/2021   HCT 41.6 05/21/2021   MCV 97.4 05/21/2021   PLT 162 05/21/2021     Assessment/Plan High grade glioma not classifiable by WHO criteria ([place])  Seizure disorder ([place])  ALYSHIA KERNAN is clinically stable today, now having completed 6 weeks of IMRT and Temozolomide.    We ultimately recommended completing course of intensity modulated radiation therapy and concurrent daily Temozolomide.  Radiation will be administered Mon-Fri over 6 weeks, Temodar will be dosed at 72m/m2 to be given daily over 42 days.    Chemotherapy should be held for the following:  ANC less than 1,000  Platelets less than 100,000  LFT or creatinine greater than 2x ULN  If clinical concerns/contraindications develop  May con't copmazine 145mq6 PRN with standard zofran regimen.  Should con't Zonegran 10056mID and Keppra 500m84mD as prior.   We ask that SheiSHADAYA MARSCHNERurn to clinic in 1 months following post-RT brain MRI, or sooner as needed.  All questions were answered. The patient knows to call the clinic with any problems, questions or concerns. No barriers to learning were detected.  The total time spent in the encounter was 30 minutes and more than 50% was on counseling and review of test results   ZachVentura Sellers Medical Director of Neuro-Oncology [facility]31/23 10:23 AM

## 2021-05-22 ENCOUNTER — Other Ambulatory Visit: Payer: Self-pay

## 2021-05-22 ENCOUNTER — Telehealth: Payer: Self-pay | Admitting: Internal Medicine

## 2021-05-22 ENCOUNTER — Ambulatory Visit: Payer: Medicare HMO

## 2021-05-22 ENCOUNTER — Inpatient Hospital Stay: Payer: Medicare HMO | Attending: Internal Medicine | Admitting: Dietician

## 2021-05-22 ENCOUNTER — Ambulatory Visit
Admission: RE | Admit: 2021-05-22 | Discharge: 2021-05-22 | Disposition: A | Discharge status: Home / Self Care | Payer: Medicare HMO | Source: Ambulatory Visit | Attending: Radiation Oncology | Admitting: Radiation Oncology

## 2021-05-22 DIAGNOSIS — C713 Malignant neoplasm of parietal lobe: Secondary | ICD-10-CM | POA: Insufficient documentation

## 2021-05-22 DIAGNOSIS — E039 Hypothyroidism, unspecified: Secondary | ICD-10-CM | POA: Insufficient documentation

## 2021-05-22 DIAGNOSIS — Z510 Encounter for antineoplastic radiation therapy: Secondary | ICD-10-CM | POA: Insufficient documentation

## 2021-05-22 NOTE — Telephone Encounter (Signed)
Scheduled per 1/31 los, message has been left with pt

## 2021-05-22 NOTE — Progress Notes (Signed)
Nutrition Assessment ° ° °Reason for Assessment: Provider request  ° ° °ASSESSMENT: 76 year old female with left parietal high grade glimoa, IDH-1 wild type. She is receiving concurrent IMRT with daily temozolomide. Patient is followed by Dr. Vaslow and Dr. Squire ° °Met with patient and husband in office after radiation treatment. Patient reports persistent mild nausea and currently feeling nauseous. She is taking zoloft and compazine for this. Patient denies episodes of vomiting. Patient reports poor appetite and bland taste of foods. She has little interest in eating. Patient has bowl of oatmeal each morning, reports she could go the rest of the day without feeling hungry. Patient is drinking one Ensure Max (150 kcal, 30 grams protein) She reports her daughter is a wonderful cook and has been preparing meals. Daughter puts large portions on her plate. Patient feels discouraged because she is unable to eat this much. This often makes her not want to eat at all.  ° ° ° °Nutrition Focused Physical Exam: deferred ° ° °Medications: bupar, zofran, compazine, norco/vicodin,zoloft, hycodan, lasix, keppra, B12, ferrous sulfate, D3 ° ° °Labs: 1/31- glucose 105, Cr 1.02 ° ° °Anthropometrics:  ° °Height: 5'0" °Weight: 229 lb 4.8 oz  °UBW: 230 lb 3.2 oz (01/08/21) °BMI: 44.78 ° ° °NUTRITION DIAGNOSIS: Inadequate oral intake related to side effects of treatments as evidenced by reported dietary recall, nausea, decreased appetite and diminished taste of foods ° ° °INTERVENTION:  °Educated on small frequent meals and snacks with adequate calories and protein - handout with ideas and shake recipes provided  °Discussed strategies for nausea - handout with tips provided °Discussed strategies for altered taste - handout with tips provided °Continue drinking ONS, suggested switching to Ensure Complete for added calories and recommend consuming 3/day - samples of Ensure Complete and Ensure Clear given for pt to try, Ensure coupons  provided °Contact information provided ° ° ° °MONITORING, EVALUATION, GOAL: Patient will tolerate increased calories and protein to minimize weight loss ° ° °Next Visit: No follow-up scheduled at this time. Patient encouraged to contact with nutrition questions/concerns ° ° ° ° ° ° °

## 2021-05-23 ENCOUNTER — Encounter: Payer: Self-pay | Admitting: Radiation Oncology

## 2021-05-23 ENCOUNTER — Ambulatory Visit
Admission: RE | Admit: 2021-05-23 | Discharge: 2021-05-23 | Disposition: A | Discharge status: Home / Self Care | Payer: Medicare HMO | Source: Ambulatory Visit | Attending: Radiation Oncology | Admitting: Radiation Oncology

## 2021-05-23 DIAGNOSIS — C713 Malignant neoplasm of parietal lobe: Secondary | ICD-10-CM | POA: Diagnosis not present

## 2021-05-23 DIAGNOSIS — Z510 Encounter for antineoplastic radiation therapy: Secondary | ICD-10-CM | POA: Diagnosis not present

## 2021-05-25 ENCOUNTER — Encounter: Payer: Self-pay | Admitting: Internal Medicine

## 2021-05-27 ENCOUNTER — Other Ambulatory Visit: Payer: Self-pay | Admitting: Family Medicine

## 2021-05-27 MED ORDER — LEVOCETIRIZINE DIHYDROCHLORIDE 5 MG PO TABS: 5.0000 mg | ORAL_TABLET | Freq: Every day | ORAL | 0 refills | Status: DC

## 2021-05-29 ENCOUNTER — Telehealth (INDEPENDENT_AMBULATORY_CARE_PROVIDER_SITE_OTHER): Payer: Medicare HMO | Admitting: Family Medicine

## 2021-05-29 ENCOUNTER — Encounter: Payer: Self-pay | Admitting: Family Medicine

## 2021-05-29 DIAGNOSIS — I480 Paroxysmal atrial fibrillation: Secondary | ICD-10-CM | POA: Diagnosis not present

## 2021-05-29 DIAGNOSIS — R69 Illness, unspecified: Secondary | ICD-10-CM | POA: Diagnosis not present

## 2021-05-29 DIAGNOSIS — I10 Essential (primary) hypertension: Secondary | ICD-10-CM | POA: Diagnosis not present

## 2021-05-29 DIAGNOSIS — F411 Generalized anxiety disorder: Secondary | ICD-10-CM

## 2021-05-29 DIAGNOSIS — G40909 Epilepsy, unspecified, not intractable, without status epilepticus: Secondary | ICD-10-CM

## 2021-05-29 DIAGNOSIS — J309 Allergic rhinitis, unspecified: Secondary | ICD-10-CM

## 2021-05-29 DIAGNOSIS — E890 Postprocedural hypothyroidism: Secondary | ICD-10-CM | POA: Diagnosis not present

## 2021-05-29 DIAGNOSIS — C719 Malignant neoplasm of brain, unspecified: Secondary | ICD-10-CM | POA: Diagnosis not present

## 2021-05-29 MED ORDER — FUROSEMIDE 20 MG PO TABS: ORAL_TABLET | ORAL | 1 refills | Status: DC

## 2021-05-29 MED ORDER — LEVETIRACETAM 500 MG PO TABS
500.0000 mg | ORAL_TABLET | Freq: Two times a day (BID) | ORAL | 1 refills | Status: DC
Start: 2021-05-29 — End: 2021-05-31

## 2021-05-29 NOTE — Progress Notes (Signed)
Virtual Visit via Video Note  I connected with Beverly Ballard on 05/29/21 at  1:00 PM EST by a video enabled telemedicine application and verified that I am speaking with the correct person using two identifiers.  Location patient: home Location provider: [place], [place] [address] Persons participating in the virtual visit: patient, provider  I discussed the limitations of evaluation and management by telemedicine and the availability of in person appointments. The patient expressed understanding and agreed to proceed.   Beverly Ballard DOB: August 05, 1945 Encounter date: 05/29/2021  This is a 76 y.o. female who presents with Chief Complaint  Patient presents with   Follow-up    History of present illness: Left parietal high grade glioma; following with oncology. Last visit was 05/21/21. She has been completing course of radiation and chemo. MRI brain scheduled 06/14/21 for recheck and follow up after that. Finished radiation last Thursday.    Saw cardiology 04/25/21. Follow up for PAF.recently worse monitor. High risk for anticoagulation.   She is still having a hard time with remembering/not remembering. She doesn't remember our last visit together. This is sad and difficult for her. She still feels tired all the time. She is starting to do a little more, but does sleep a lot.   Patient states that mood has been pretty good; husband agrees. Has some crying episodes on occasion. She seems to work through these.   She states that her oncologist told her to try walking. She did go out to eat on Monday. Wore her wig for the first time on Monday. Went shopping. This was a moral booster for her.   Dizziness is doing better. Able to walk around without walking some now.she can tell she is stronger and able to get herself up and around. Nausea is still there; just stuck with her. Sometimes medicine helps, but not always.   Breathing feels a lot better.  Doesn't use the nebulizer. Just never started after surgery.   Hasn't been taking allergy medication - xyzal was refilled 2 days ago, but she hasn't taken this yet.  Bp has been running on low end. Checking at specialty visit.   Hasn't talked with theresa hurst recently. Wasn't feeling well when she last checked in for visit.   Allergies  Allergen Reactions   Lamotrigine Swelling    Tongue swelling   Bupropion Other (See Comments)    Unknown reaction   Carbamazepine Other (See Comments)    Hypnatremia   Levetiracetam Other (See Comments)    dizziness   Tramadol Other (See Comments)    Unknown reaction   Adhesive [Tape] Other (See Comments)    ?  Blister after knee surgery   Amitriptyline Nausea And Vomiting   Clarithromycin Other (See Comments)    Unknown reaction     Doxycycline Other (See Comments)    Interfered with seizure medication   Nickel Other (See Comments)    Had to remove knee implant with nickel and replace it   Other Other (See Comments)    Allergic to Metal and perfumes (unknown reaction)   Topamax [Topiramate] Nausea And Vomiting   Estradiol Rash   Latex Rash   Phenytoin Sodium Extended Other (See Comments)    drowsiness   Current Meds  Medication Sig   albuterol (VENTOLIN HFA) 108 (90 Base) MCG/ACT inhaler Inhale 1-2 puffs into the lungs every 6 (six) hours as needed for wheezing or shortness of breath.   benzonatate (TESSALON)  100 MG capsule Take 1 capsule (100 mg total) by mouth every 8 (eight) hours.   Budeson-Glycopyrrol-Formoterol (BREZTRI AEROSPHERE) 160-9-4.8 MCG/ACT AERO Inhale 2 puffs into the lungs 2 (two) times daily.   busPIRone (BUSPAR) 10 MG tablet TAKE 1 TABLET BY MOUTH TWICE A DAY   cholecalciferol (VITAMIN D3) 25 MCG (1000 UNIT) tablet Take 1,000 Units by mouth every morning.   EPINEPHrine 0.3 mg/0.3 mL IJ SOAJ injection Inject 0.3 mg into the muscle once as needed for anaphylaxis.   esomeprazole (NEXIUM) 20 MG capsule Take 2 capsules  (40 mg total) by mouth daily before breakfast. (Patient taking differently: Take 20 mg by mouth daily before breakfast.)   ferrous sulfate 325 (65 FE) MG tablet Take 325 mg by mouth 3 (three) times a week.   furosemide (LASIX) 20 MG tablet Take 1 tablet (20 mg total) by mouth every morning.   HYDROcodone bit-homatropine (HYCODAN) 5-1.5 MG/5ML syrup Take 5 mLs by mouth every 4 (four) hours as needed for cough.   HYDROcodone-acetaminophen (NORCO/VICODIN) 5-325 MG tablet Take 1 tablet by mouth every 4 (four) hours as needed for moderate pain. Take w/ food.   levETIRAcetam (KEPPRA) 500 MG tablet Take 1 tablet (500 mg total) by mouth 2 (two) times daily.   levocetirizine (XYZAL) 5 MG tablet Take 1 tablet (5 mg total) by mouth at bedtime.   levothyroxine (SYNTHROID) 88 MCG tablet Take 1 tablet (88 mcg total) by mouth daily. (Patient taking differently: Take 88 mcg by mouth daily before breakfast.)   lisinopril (ZESTRIL) 5 MG tablet Take 1 tablet (5 mg total) by mouth every morning.   ondansetron (ZOFRAN) 8 MG tablet Take 1 tablet (8 mg total) by mouth 3 (three) times daily as needed (nausea and vomiting). May take 30-60 minutes prior to Temodar administration if nausea/vomiting occurs.   pravastatin (PRAVACHOL) 20 MG tablet Take 1 tablet (20 mg total) by mouth at bedtime.   prochlorperazine (COMPAZINE) 10 MG tablet Take 1 tablet (10 mg total) by mouth every 6 (six) hours as needed for nausea or vomiting.   sertraline (ZOLOFT) 100 MG tablet TAKE 2 TABLETS BY MOUTH EVERY DAY   solifenacin (VESICARE) 10 MG tablet Take 10 mg by mouth at bedtime.   Spacer/Aero-Holding Chambers (AEROCHAMBER PLUS) inhaler Use as instructed   temozolomide (TEMODAR) 140 MG capsule Take 1 capsule (140 mg total) by mouth daily. (Take in addition to 20 mg capsule). May take on an empty stomach to decrease nausea & vomiting.   temozolomide (TEMODAR) 20 MG capsule Take 1 capsule (20 mg total) by mouth daily. (Take in addition to 140 mg  capsule). May take on an empty stomach to decrease nausea & vomiting.   vitamin B-12 (CYANOCOBALAMIN) 1000 MCG tablet Take 1,000 mcg by mouth every morning.   zonisamide (ZONEGRAN) 100 MG capsule Take 2 capsules twice a day (Patient taking differently: Take 200 mg by mouth 2 (two) times daily. Seizure medication)    Review of Systems  Constitutional:  Positive for fatigue. Negative for chills and fever.  Respiratory:  Negative for cough, chest tightness, shortness of breath and wheezing.   Cardiovascular:  Negative for chest pain, palpitations and leg swelling.  Neurological:  Positive for weakness (improving).   Objective:  There were no vitals taken for this visit.      BP Readings from Last 3 Encounters:  05/21/21 (!) 116/58  05/13/21 (!) 101/36  04/29/21 (!) 109/53   Wt Readings from Last 3 Encounters:  05/21/21 229 lb 4.8 oz (  104 kg)  04/29/21 222 lb (100.7 kg)  04/25/21 234 lb (106.1 kg)    EXAM:  GENERAL: alert, oriented, appears well and in no acute distress  HEENT: atraumatic, conjunctiva clear, no obvious abnormalities on inspection of external nose and ears  NECK: normal movements of the head and neck  LUNGS: on inspection no signs of respiratory distress, breathing rate appears normal, no obvious gross SOB, gasping or wheezing  CV: no obvious cyanosis  MS: moves all visible extremities without noticeable abnormality  PSYCH/NEURO: pleasant and cooperative, no obvious depression or anxiety, speech and thought processing grossly intact. She does struggle with word finding, but given time is able to come up with word. She is frustrated with this delay/difficulty.    Assessment/Plan  1. Essential hypertension Encouraged her to stop the lisinopril.  She has been on 5 mg and blood pressures are running relatively low.  We are working on ways to help her simplify her medicine list.  Continue to monitor blood pressures at home.  2. PAF (paroxysmal atrial  fibrillation) ([place]) 2 high risk for anticoagulation.  She has been rate controlled.  He does follow with cardiology.  3. Allergic rhinitis, unspecified seasonality, unspecified trigger She has not been taking allergy medication on a regular basis.  I did receive a refill request for cetirizine, which she has picked up.  Encouraged her to not take this if not needed and discussed increased risk of antihistamine.  4. Postablative hypothyroidism Continue with Synthroid 88 mcg daily.  Last TSH was normal in 11/22.  5. Seizure disorder [facility]) She is following regularly with neurology and discussing seizures when she sees oncology.  Continue with medication dosing per their expertise.  6. High grade glioma not classifiable by WHO criteria [facility]) She will be having repeat scan later this month.  I told her I would follow-up on these results.  She may be due for some additional blood work, but I do not feel that we need to do this right now.  If there are other labs that I would like ordered, we can simply have this included when she follows up with oncology for blood draw.  7. Anxiety: Mood has been a little bit better.  Anxiety has been fairly well controlled.  We discussed tapering down on the BuSpar and stopping if not noting any difficulties with taper down.  For now, continue with Zoloft, but encouraged her to discuss with her mental health provider cutting back on this as well since she is at such a high dose.  I am hopeful that cutting back on medications is going to help her with some of her ongoing nausea as well as just pill burden.    (She is no longer using any inhalers but breathing seems to be doing better.)  I discussed the assessment and treatment plan with the patient. The patient was provided an opportunity to ask questions and all were answered. The patient agreed with the plan and demonstrated an understanding of the instructions.   The patient was advised to call back or seek an  in-person evaluation if the symptoms worsen or if the condition fails to improve as anticipated.  I provided 30 minutes of face-to-face time during this encounter.   Micheline Rough, MD

## 2021-05-31 ENCOUNTER — Ambulatory Visit: Payer: Medicare HMO | Admitting: Neurology

## 2021-05-31 ENCOUNTER — Encounter: Payer: Self-pay | Admitting: Neurology

## 2021-05-31 ENCOUNTER — Other Ambulatory Visit: Payer: Self-pay

## 2021-05-31 VITALS — BP 143/79 | HR 72 | Resp 20 | Ht 65.0 in | Wt 227.0 lb

## 2021-05-31 DIAGNOSIS — G4489 Other headache syndrome: Secondary | ICD-10-CM

## 2021-05-31 DIAGNOSIS — G40309 Generalized idiopathic epilepsy and epileptic syndromes, not intractable, without status epilepticus: Secondary | ICD-10-CM

## 2021-05-31 DIAGNOSIS — G40009 Localization-related (focal) (partial) idiopathic epilepsy and epileptic syndromes with seizures of localized onset, not intractable, without status epilepticus: Secondary | ICD-10-CM

## 2021-05-31 DIAGNOSIS — C719 Malignant neoplasm of brain, unspecified: Secondary | ICD-10-CM

## 2021-05-31 MED ORDER — GABAPENTIN 300 MG PO CAPS: ORAL_CAPSULE | ORAL | 6 refills | Status: DC

## 2021-05-31 MED ORDER — LEVETIRACETAM 500 MG PO TABS: ORAL_TABLET | ORAL | 3 refills | Status: DC

## 2021-05-31 MED ORDER — ZONISAMIDE 100 MG PO CAPS: ORAL_CAPSULE | ORAL | 3 refills | Status: DC

## 2021-05-31 NOTE — Progress Notes (Signed)
NEUROLOGY FOLLOW UP OFFICE NOTE  Beverly Ballard [id number] 02/18/1946  HISTORY OF PRESENT ILLNESS: I had the pleasure of seeing Beverly Ballard in follow-up in the neurology clinic on 05/31/2021.  The patient was last seen 7 months ago for epilepsy. She is accompanied by her husband who helps supplement the history today. Records and images were personally reviewed where available.  On her last visit, we discussed brain MRI done 07/2020 ordered after she had a fall in 02/2020 and reported continued fogginess/headaches, dizziness, sleep and balance changes. There was note of asymmetric left periatrial white matter changes that were new since 2017 but not acute. Since then, she had a different type of seizure on 02/16/21 with generalized tonic-clonic activity that recurred in the ER. Imaging showed a rounded mass in the left posterior parietal cortex confirmed on follow-up MRI showing a heterogeneously enhancing mass with at least 2 adjacent peripherally enhancing lesions more laterally, extensive edema. She underwent craniotomy on 02/22/21 with pathology showing high grade glioma. She underwent radiation therapy and treatment with Temodar. She had another bout of breakthrough seizures on 02/27/21 and Levetiracetam 500mg  BID was added to Zonisamide 200mg  BID. She finished chemo and radiation last week. She reports a very short seizure on 05/23/21, she was laying in bed and her husband noticed she was not responding and it looked like she was scratching her head. There was no prior warning, no injuries, tongue bite or incontinence. She is frustrated that she does not remember things. There is a lot of pain on her left shoulder going down the left arm. She also has left leg pain due to her knee but feels it is getting better. Right side feels okay, leg is stiff. She is trying to get back to her crafts but has noticed it takes a longer time, taking her a day instead of 2-3 hours to finish a task. She is having headaches,  taking Norco 2-3 times a week. Pain is in the back of her head, sometimes with a shooting pain. She has chronic nausea prior to recent changes, this has worsened. She has bilateral hand tremors.No dizziness.  She had a fall a week ago and ambulates with a cane. She has not been sleeping well.    History on Initial Assessment 10/28/2019: This is a 76 year old right-handed woman with a history of hypertension, hyperlipidemia, hypothyroidism, migraines, vaginal cancer, complex partial seizures, presenting for second opinion regarding seizure medication. Records from her neurologist Dr. Macario Carls were reviewed. Seizures started in childhood then stopped for a few years until she was in her teens. She thinks she had staring episodes, no further staring spells since 1988. She had a seizure in 2014 when she was completely weaned off seizure medication. She recalls she would smell a funny odor prior to a seizure. No nocturnal seizures. She has tried several seizure medications, she could not tolerate Levetiracetam, Lamotrigine. She had hyponatremia on carbamazepine. Aptiom was cost-prohibitive. She has been taking Zonisamide 200mg  BID since 2017. MRI brain with and without contrast in 2017 was unremarkable, there was a small Rathke's cleft cyst within the pituitary gland measuring 44mm.  She presents for second opinion regarding her seizure medication. She has been having a lot of health problems and has had a "test on every part of my body" with no clear cause found. She has been suffering from nausea for the past 5 years, no vomiting, occasional stomach pain. She is having a lot of drowsiness. She feels sick to her  stomach right now and has an acidic taste. She lives with her husband and denies any staring/unresponsive episodes, gaps in time, olfactory/gustatory hallucinations, myoclonic jerks. She has occasional left arm tingling. She has right-sided neck pain and back pain. No headaches, dizziness, vision changes,  bowel/bladder dysfunction. Shd a fall 2-3 weeks ago when she fell backward. She is not sure if she passed out, mostly reporting she has not been feeling good for a long time. She had a pneumonia 3 years ago where she had inflammation in her lungs and was not feeling well, she had another bout this year, as well as dealing with depression. She has a CPAP machine which has helped her sleep a little better at night.  Epilepsy Risk Factors:  She had a normal birth and early development.  There is no history of febrile convulsions, CNS infections such as meningitis/encephalitis, significant traumatic brain injury, neurosurgical procedures, or family history of seizures.  Prior AEDs: Levetiracetam, Lamotrigine, carbamazepine, lacosamide  Diagnostic Data: MRI brain with and without contrast in 2017 was unremarkable, there was a small Rathke's cleft cyst within the pituitary gland measuring 16mm. Her 1-hour EEG in July 2021 was abnormal with frequent bursts of generalized high voltage 3-5 Hz spike and polyspike and wave discharges.   PAST MEDICAL HISTORY: Past Medical History:  Diagnosis Date   Allergy    Anemia    Anxiety and depression 12/19/2009   Arthritis    Cancer ([place])    vaginal   Degenerative disorder of eye    Diverticulosis    Dysrhythmia 01/2021   Atrial fib/flutter   Essential hypertension, benign 06/26/2009   GERD 05/27/2007   takes Nexium daily   History of gout    History of migraine many yrs ago   Hyperlipidemia    takes Pravastatin daily   Hypothyroidism    Insomnia    but doesn't take any meds   Osteoporosis    Seizures ([place])    last 1983, none since then- last seizure 1988 per pt    Sleep apnea    wears CPAP    MEDICATIONS: Current Outpatient Medications on File Prior to Visit  Medication Sig Dispense Refill   albuterol (VENTOLIN HFA) 108 (90 Base) MCG/ACT inhaler Inhale 1-2 puffs into the lungs every 6 (six) hours as needed for wheezing or shortness of breath. 8  g 0   busPIRone (BUSPAR) 10 MG tablet TAKE 1 TABLET BY MOUTH TWICE A DAY 180 tablet 0   cholecalciferol (VITAMIN D3) 25 MCG (1000 UNIT) tablet Take 1,000 Units by mouth every morning.     EPINEPHrine 0.3 mg/0.3 mL IJ SOAJ injection Inject 0.3 mg into the muscle once as needed for anaphylaxis.     esomeprazole (NEXIUM) 20 MG capsule Take 2 capsules (40 mg total) by mouth daily before breakfast. (Patient taking differently: Take 20 mg by mouth daily before breakfast.) 2 capsule 0   furosemide (LASIX) 20 MG tablet Daily prn leg swelling 90 tablet 1   HYDROcodone-acetaminophen (NORCO/VICODIN) 5-325 MG tablet Take 1 tablet by mouth every 4 (four) hours as needed for moderate pain. Take w/ food. 40 tablet 0   levETIRAcetam (KEPPRA) 500 MG tablet Take 1 tablet (500 mg total) by mouth 2 (two) times daily. 180 tablet 1   levothyroxine (SYNTHROID) 88 MCG tablet Take 1 tablet (88 mcg total) by mouth daily. (Patient taking differently: Take 88 mcg by mouth daily before breakfast.) 90 tablet 3   ondansetron (ZOFRAN) 8 MG tablet Take 1 tablet (8  mg total) by mouth 3 (three) times daily as needed (nausea and vomiting). May take 30-60 minutes prior to Temodar administration if nausea/vomiting occurs. 30 tablet 3   pravastatin (PRAVACHOL) 20 MG tablet Take 1 tablet (20 mg total) by mouth at bedtime. 90 tablet 1   prochlorperazine (COMPAZINE) 10 MG tablet Take 1 tablet (10 mg total) by mouth every 6 (six) hours as needed for nausea or vomiting. 30 tablet 3   sertraline (ZOLOFT) 100 MG tablet TAKE 2 TABLETS BY MOUTH EVERY DAY 180 tablet 1   solifenacin (VESICARE) 10 MG tablet Take 10 mg by mouth at bedtime.     Spacer/Aero-Holding Chambers (AEROCHAMBER PLUS) inhaler Use as instructed 1 each 0   temozolomide (TEMODAR) 140 MG capsule Take 1 capsule (140 mg total) by mouth daily. (Take in addition to 20 mg capsule). May take on an empty stomach to decrease nausea & vomiting. 42 capsule 0   temozolomide (TEMODAR) 20 MG  capsule Take 1 capsule (20 mg total) by mouth daily. (Take in addition to 140 mg capsule). May take on an empty stomach to decrease nausea & vomiting. 42 capsule 0   vitamin B-12 (CYANOCOBALAMIN) 1000 MCG tablet Take 1,000 mcg by mouth every morning.     zonisamide (ZONEGRAN) 100 MG capsule Take 2 capsules twice a day (Patient taking differently: Take 200 mg by mouth 2 (two) times daily. Seizure medication) 360 capsule 3   No current facility-administered medications on file prior to visit.    ALLERGIES: Allergies  Allergen Reactions   Lamotrigine Swelling    Tongue swelling   Bupropion Other (See Comments)    Unknown reaction   Carbamazepine Other (See Comments)    Hypnatremia   Levetiracetam Other (See Comments)    dizziness   Tramadol Other (See Comments)    Unknown reaction   Adhesive [Tape] Other (See Comments)    ?  Blister after knee surgery   Amitriptyline Nausea And Vomiting   Clarithromycin Other (See Comments)    Unknown reaction     Doxycycline Other (See Comments)    Interfered with seizure medication   Nickel Other (See Comments)    Had to remove knee implant with nickel and replace it   Other Other (See Comments)    Allergic to Metal and perfumes (unknown reaction)   Topamax [Topiramate] Nausea And Vomiting   Estradiol Rash   Latex Rash   Phenytoin Sodium Extended Other (See Comments)    drowsiness    FAMILY HISTORY: Family History  Problem Relation Age of Onset   Aortic dissection Father    Arthritis Mother    Stroke Mother 83   Heart murmur Other    Throat cancer Sister    Colon cancer Neg Hx    Esophageal cancer Neg Hx    Stomach cancer Neg Hx    Rectal cancer Neg Hx    Colon polyps Neg Hx     SOCIAL HISTORY: Social History   Socioeconomic History   Marital status: Married    Spouse name: Not on file   Number of children: 1   Years of education: Not on file   Highest education level: 12th grade  Occupational History   Occupation:  Retired  Tobacco Use   Smoking status: Former    Packs/day: 0.25    Years: 20.00    Pack years: 5.00    Types: Cigarettes    Quit date: 09/08/1978    Years since quitting: 42.7   Smokeless tobacco: Never  Tobacco comments:    quit smoking 78yrs ago  Vaping Use   Vaping Use: Never used  Substance and Sexual Activity   Alcohol use: No   Drug use: No   Sexual activity: Not Currently    Birth control/protection: Post-menopausal  Other Topics Concern   Not on file  Social History Narrative   Right handed    Lives with husband    Social Determinants of Health   Financial Resource Strain: Low Risk    Difficulty of Paying Living Expenses: Not very hard  Food Insecurity: Unknown   Worried About Charity fundraiser in the Last Year: Patient refused   Arboriculturist in the Last Year: Never true  Transportation Needs: No Transportation Needs   Lack of Transportation (Medical): No   Lack of Transportation (Non-Medical): No  Physical Activity: Unknown   Days of Exercise per Week: 1 day   Minutes of Exercise per Session: Patient refused  Stress: Stress Concern Present   Feeling of Stress : Very much  Social Connections: Moderately Isolated   Frequency of Communication with Friends and Family: Three times a week   Frequency of Social Gatherings with Friends and Family: Three times a week   Attends Religious Services: Never   Active Member of Clubs or Organizations: No   Attends Music therapist: Never   Marital Status: Married  Human resources officer Violence: Not At Risk   Fear of Current or Ex-Partner: No   Emotionally Abused: No   Physically Abused: No   Sexually Abused: No     PHYSICAL EXAM: Vitals:   05/31/21 1554  BP: (!) 143/79  Pulse: 72  Resp: 20  SpO2: 95%   General: No acute distress Head:  Normocephalic/atraumatic Skin/Extremities: No rash, no edema Neurological Exam: alert and oriented to person, place, and time. No aphasia or dysarthria. Fund of  knowledge is appropriate. Attention and concentration are normal.   Cranial nerves: Pupils equal, round. Extraocular movements intact with no nystagmus. Right homonymous hemianopia.  No facial asymmetry.  Motor: Bulk and tone normal, no cogwheeling, muscle strength 5/5 throughout with no pronator drift.   Finger to nose testing intact.  Gait slow and cautious with cane, no ataxia. No resting tremor, +bilateral postural and endpoint hand tremors. Good finger taps.    IMPRESSION: This is a 76 yo RH woman with a history of hypertension, hyperlipidemia, hypothyroidism, migraines, vaginal cancer, primary generalized epilepsy that had been well-controlled until she had breakthrough seizures different from typical seizures last 01/2021 and was found to have a left posterior parietal high grade glioma, now s/p craniotomy, radiation and chemotherapy. No GTCs since 02/2021, however she had a brief focal seizure on 05/23/2021. Increase Levetiracetam to 750mg  BID, continue Zonisamide 200mg  BID. She is reporting significant headaches and sleep difficulties, we discussed starting low dose Gabapentin 300mg  qhs, side effects discussed. She is frustrated with cognitive changes and will be referred to Speech therapy for cognitive therapy. Proceed with occupational therapy as planned. She is aware of [place] driving laws to stop driving until 6 months seizure-free. Follow-up in 3 months, call for any changes.    Thank you for allowing me to participate in her care.  Please do not hesitate to call for any questions or concerns.    Ellouise Newer, M.D.   CC: Dr. Ethlyn Gallery

## 2021-05-31 NOTE — Patient Instructions (Signed)
Increase Keppra (levetiracetam) 500mg : take 1 and 1/2 tablets twice a day  2. Start low dose Gabapentin 300mg : take 1 capsule every night to help with headaches and sleep  3. Continue Zonisamide 200mg  twice a day  4. Refer to Speech therapy for cognitive therapy after brain surgery  5. Proceed with occupational therapy  6. Follow-up in 3 months, call for any changes   Seizure Precautions: 1. If medication has been prescribed for you to prevent seizures, take it exactly as directed.  Do not stop taking the medicine without talking to your doctor first, even if you have not had a seizure in a long time.   2. Avoid activities in which a seizure would cause danger to yourself or to others.  Don't operate dangerous machinery, swim alone, or climb in high or dangerous places, such as on ladders, roofs, or girders.  Do not drive unless your doctor says you may.  3. If you have any warning that you may have a seizure, lay down in a safe place where you can't hurt yourself.    4.  No driving for 6 months from last seizure, as per [facility].   Please refer to the following link on the [facility] website for more information: http://www.epilepsyfoundation.org/answerplace/Social/driving/drivingu.cfm   5.  Maintain good sleep hygiene.   6.  Contact your doctor if you have any problems that may be related to the medicine you are taking.  7.  Call 911 and bring the patient back to the ED if:        A.  The seizure lasts longer than 5 minutes.       B.  The patient doesn't awaken shortly after the seizure  C.  The patient has new problems such as difficulty seeing, speaking or moving  D.  The patient was injured during the seizure  E.  The patient has a temperature over 102 F (39C)  F.  The patient vomited and now is having trouble breathing

## 2021-06-05 ENCOUNTER — Encounter: Payer: Self-pay | Admitting: Internal Medicine

## 2021-06-05 ENCOUNTER — Ambulatory Visit: Payer: Medicare HMO | Admitting: Occupational Therapy

## 2021-06-05 NOTE — Progress Notes (Signed)
° °                                                                                                                                                          °  Patient Name: Beverly Ballard MRN: [id number] DOB: 1945/07/23 Referring Physician: Micheline Rough (Profile Not Attached) Date of Service: 05/23/2021 [facility], [place]                                                        End Of Treatment Note  Diagnoses: C71.3-Malignant neoplasm of parietal lobe  Cancer Staging:  Cancer Staging  High grade glioma not classifiable by WHO criteria [facility]) Staging form: Brain and Spinal Cord, AJCC 8th Edition - Pathologic stage from 02/22/2021: WHO Grade IV - Signed by Ventura Sellers, MD on 03/18/2021 Stage prefix: Initial diagnosis Histologic grading system: 4 grade system Extent of surgical resection: Subtotal resection Solitary (s) or multifocal (m) tumors in the primary site: Solitary Tumor location in brain: Eloquent brain area Karnofsky performance status: Score 80 Seizures at presentation: Present Duration of symptoms before diagnosis: Short IDH1 mutation: Negative   Intent: Curative  Radiation Treatment Dates: 04/08/2021 through 05/23/2021 Site Technique Total Dose (Gy) Dose per Fx (Gy) Completed Fx Beam Energies  Parietal Lobe: Brain_L_Parie IMRT 46/46 2 23/23 6X  Parietal Lobe: Brain_Bst_L_Par[place] IMRT 14/14 2 7/7 6X   Narrative: The patient tolerated radiation therapy relatively well.   Plan: The patient will follow-up with radiation oncology in 60mo -----------------------------------  SEppie Gibson MD

## 2021-06-07 ENCOUNTER — Telehealth: Payer: Self-pay | Admitting: Neurology

## 2021-06-07 ENCOUNTER — Telehealth: Payer: Self-pay

## 2021-06-07 NOTE — Telephone Encounter (Signed)
Pt husband called informed that we do not prescribe pain medication. He stated he would call her PCP

## 2021-06-07 NOTE — Telephone Encounter (Signed)
T/C from Mr Keasling asking if you will refill pt's hydrocodone 5/325mg  stating other Drs have prescribed this but feels it should come from her oncologist.

## 2021-06-07 NOTE — Telephone Encounter (Signed)
Mr Devito advised and voiced understanding

## 2021-06-07 NOTE — Telephone Encounter (Signed)
Patients husband called and stated his wife is out of hydrocodone.  He also has some questions about her other medicines. Please call back.

## 2021-06-11 ENCOUNTER — Encounter: Payer: Self-pay | Admitting: Orthopaedic Surgery

## 2021-06-11 ENCOUNTER — Ambulatory Visit: Payer: Medicare HMO | Admitting: Orthopaedic Surgery

## 2021-06-11 ENCOUNTER — Other Ambulatory Visit: Payer: Self-pay

## 2021-06-11 VITALS — Ht 65.0 in | Wt 227.0 lb

## 2021-06-11 DIAGNOSIS — M5412 Radiculopathy, cervical region: Secondary | ICD-10-CM | POA: Diagnosis not present

## 2021-06-11 NOTE — Progress Notes (Signed)
Office Visit Note   Patient: Beverly Ballard           Date of Birth: 1945-12-16           MRN: [id number] Visit Date: 06/11/2021              Requested by: Caren Macadam, MD [place],  [place] [id number] PCP: Caren Macadam, MD   Assessment & Plan: Visit Diagnoses:  1. Radiculopathy of cervical spine     Plan: Impression is cervical spine radiculopathy left upper extremity.  At this point, would like to refer back to Dr. Ernestina Patches for repeat injection as she had great relief from previous injection.  She will follow-up with [place] as needed.  Follow-Up Instructions: Return if symptoms worsen or fail to improve.   Orders:  No orders of the defined types were placed in this encounter.  No orders of the defined types were placed in this encounter.     Procedures: No procedures performed   Clinical Data: No additional findings.   Subjective: Chief Complaint  Patient presents with   Neck - Pain, Follow-up    HPI patient is a pleasant 76 year old female who comes in today with recurrent left upper extremity radiculopathy.  She was seen in our office back in the fall where she was diagnosed with severe left foraminal narrowing C5-6 patient on MRI findings.  She was referred to Dr. Ernestina Patches for epidural steroid injection.  She does note that this significantly helped for many months.  Her symptoms have returned and started to worsen.  She also notes that she was diagnosed with stage IV glioblastoma back in November.  She is having cognitive difficulties from this.  Review of Systems as detailed in HPI.  All others reviewed and are negative.   Objective: Vital Signs: Ht 5\' 5"  (1.651 m)    Wt 227 lb (103 kg)    BMI 37.77 kg/m   Physical Exam well-developed well-nourished female no acute distress but alert and oriented x3.  Ortho Exam left shoulder exam shows 50% range of motion of the shoulder which causes pain to the trapezius.  She has increased pain  with cervical spine flexion and extension.  Specialty Comments:  No specialty comments available.  Imaging: No new imaging   PMFS History: Patient Active Problem List   Diagnosis Date Noted   PAF (paroxysmal atrial fibrillation) ([place]) 04/23/2021   Goals of care, counseling/discussion 03/18/2021   Seizure ([place]) 02/28/2021   S/P craniotomy 02/22/2021   High grade glioma not classifiable by WHO criteria ([place]) 02/17/2021   Subarachnoid hemorrhage ([place]) 02/17/2021   Acute hypoxemic respiratory failure ([place]) 02/17/2021   Atrial flutter ([place]) 02/17/2021   Seizures ([place]) 02/16/2021   Generalized anxiety disorder 08/11/2020   Insomnia 08/11/2020   Major depressive disorder, recurrent episode, moderate ([place]) 08/11/2020   Obstructive sleep apnea 08/11/2020   Ankle edema, bilateral 10/31/2019   Excessive daytime sleepiness 06/28/2019   Shortness of breath 06/28/2019   Healthcare maintenance 06/28/2019   ANA positive 06/28/2019   Bronchiectasis ([place]) 06/28/2019   Depression, major, single episode, moderate ([place]) 11/19/2018   Fatigue 03/23/2018   Left knee pain 01/12/2017   Ovarian cyst 10/30/2016   Aortic atherosclerosis ([place]) 11/19/2015   Hyperlipemia 04/03/2015   S/P revision of total knee 09/19/2014   Seizure disorder ([place]) 01/24/2014   Obesity, BMI unknown 01/24/2014   Osteopenia, stable on dexa 2010 - followed by gyn 06/15/2012   Pap smear abnormality of  cervix/human papillomavirus (HPV) positive - 09/2011, followed by gyn 06/15/2012   Essential hypertension 07/03/2009   Hypothyroidism 05/27/2007   Allergic rhinitis 05/27/2007   GERD 05/27/2007   Past Medical History:  Diagnosis Date   Allergy    Anemia    Anxiety and depression 12/19/2009   Arthritis    Cancer ([place])    vaginal   Degenerative disorder of eye    Diverticulosis    Dysrhythmia 01/2021   Atrial fib/flutter   Essential hypertension, benign 06/26/2009   GERD 05/27/2007   takes Nexium daily   History of  gout    History of migraine many yrs ago   Hyperlipidemia    takes Pravastatin daily   Hypothyroidism    Insomnia    but doesn't take any meds   Osteoporosis    Seizures ([place])    last 1983, none since then- last seizure 1988 per pt    Sleep apnea    wears CPAP    Family History  Problem Relation Age of Onset   Aortic dissection Father    Arthritis Mother    Stroke Mother 35   Heart murmur Other    Throat cancer Sister    Colon cancer Neg Hx    Esophageal cancer Neg Hx    Stomach cancer Neg Hx    Rectal cancer Neg Hx    Colon polyps Neg Hx     Past Surgical History:  Procedure Laterality Date   ADENOIDECTOMY     APPLICATION OF CRANIAL NAVIGATION N/A 02/22/2021   Procedure: APPLICATION OF CRANIAL NAVIGATION;  Surgeon: Eustace Moore, MD;  Location: [facility];  Service: Neurosurgery;  Laterality: N/A;   BACK SURGERY     lumbar x2   BRONCHIAL BIOPSY  09/12/2019   Procedure: BRONCHIAL BIOPSIES;  Surgeon: Laurin Coder, MD;  Location: [facility];  Service: Endoscopy;;   BRONCHIAL WASHINGS  09/12/2019   Procedure: BRONCHIAL WASHINGS;  Surgeon: Laurin Coder, MD;  Location: [facility];  Service: Endoscopy;;   COLONOSCOPY  2009   CRANIOTOMY N/A 02/22/2021   Procedure: CRANIOTOMY FOR TUMOR EXCISION;  Surgeon: Eustace Moore, MD;  Location: [facility];  Service: Neurosurgery;  Laterality: N/A;   ESOPHAGOGASTRODUODENOSCOPY     Heel spurs Bilateral    HEMOSTASIS CONTROL  09/12/2019   Procedure: HEMOSTASIS CONTROL;  Surgeon: Laurin Coder, MD;  Location: [facility];  Service: Endoscopy;;  epi   KNEE ARTHROPLASTY Left 11/07/2013   Procedure: COMPUTER ASSISTED TOTAL KNEE ARTHROPLASTY;  Surgeon: Marybelle Killings, MD;  Location: [facility];  Service: Orthopedics;  Laterality: Left;  Left Total Knee Arthroplasty, Cemented, Computer Assist   TONSILLECTOMY     TOTAL KNEE REVISION Left 09/19/2014   Procedure: LEFT TOTAL KNEE REVISION;  Surgeon: Leandrew Koyanagi, MD;  Location: [facility];  Service:  Orthopedics;  Laterality: Left;   UPPER GASTROINTESTINAL ENDOSCOPY     VIDEO BRONCHOSCOPY N/A 09/12/2019   Procedure: VIDEO BRONCHOSCOPY WITH FLUORO;  Surgeon: Laurin Coder, MD;  Location: [facility];  Service: Endoscopy;  Laterality: N/A;   Social History   Occupational History   Occupation: Retired  Tobacco Use   Smoking status: Former    Packs/day: 0.25    Years: 20.00    Pack years: 5.00    Types: Cigarettes    Quit date: 09/08/1978    Years since quitting: 42.7   Smokeless tobacco: Never   Tobacco comments:    quit smoking 72yrs ago  Vaping Use   Vaping  Use: Never used  Substance and Sexual Activity   Alcohol use: No   Drug use: No   Sexual activity: Not Currently    Birth control/protection: Post-menopausal

## 2021-06-12 ENCOUNTER — Other Ambulatory Visit: Payer: Self-pay

## 2021-06-12 DIAGNOSIS — M5412 Radiculopathy, cervical region: Secondary | ICD-10-CM

## 2021-06-13 ENCOUNTER — Ambulatory Visit: Payer: Medicare HMO | Admitting: Occupational Therapy

## 2021-06-13 ENCOUNTER — Ambulatory Visit: Payer: Medicare HMO

## 2021-06-14 ENCOUNTER — Other Ambulatory Visit: Payer: Self-pay

## 2021-06-14 ENCOUNTER — Ambulatory Visit (HOSPITAL_COMMUNITY)
Admission: RE | Admit: 2021-06-14 | Discharge: 2021-06-14 | Disposition: A | Discharge status: Home / Self Care | Payer: Medicare HMO | Source: Ambulatory Visit | Attending: Internal Medicine | Admitting: Internal Medicine

## 2021-06-14 ENCOUNTER — Other Ambulatory Visit: Payer: Self-pay | Admitting: Physician Assistant

## 2021-06-14 DIAGNOSIS — C719 Malignant neoplasm of brain, unspecified: Secondary | ICD-10-CM

## 2021-06-14 DIAGNOSIS — G9389 Other specified disorders of brain: Secondary | ICD-10-CM | POA: Diagnosis not present

## 2021-06-14 MED ORDER — GADOBUTROL 1 MMOL/ML IV SOLN
10.0000 mL | Freq: Once | INTRAVENOUS | Status: AC | PRN
  Administered 2021-06-14: 10 mL via INTRAVENOUS

## 2021-06-14 MED ORDER — HYDROCODONE-ACETAMINOPHEN 5-325 MG PO TABS: 1.0000 | ORAL_TABLET | Freq: Three times a day (TID) | ORAL | 0 refills | Status: DC | PRN

## 2021-06-18 ENCOUNTER — Inpatient Hospital Stay (HOSPITAL_BASED_OUTPATIENT_CLINIC_OR_DEPARTMENT_OTHER): Payer: Medicare HMO | Admitting: Internal Medicine

## 2021-06-18 ENCOUNTER — Other Ambulatory Visit: Payer: Self-pay

## 2021-06-18 VITALS — BP 161/70 | HR 73 | Temp 98.7°F | Resp 20

## 2021-06-18 DIAGNOSIS — C719 Malignant neoplasm of brain, unspecified: Secondary | ICD-10-CM | POA: Diagnosis not present

## 2021-06-18 DIAGNOSIS — G40909 Epilepsy, unspecified, not intractable, without status epilepticus: Secondary | ICD-10-CM | POA: Diagnosis not present

## 2021-06-18 DIAGNOSIS — E039 Hypothyroidism, unspecified: Secondary | ICD-10-CM | POA: Diagnosis not present

## 2021-06-18 DIAGNOSIS — C713 Malignant neoplasm of parietal lobe: Secondary | ICD-10-CM | POA: Diagnosis not present

## 2021-06-18 MED ORDER — DEXAMETHASONE 4 MG PO TABS: 4.0000 mg | ORAL_TABLET | Freq: Two times a day (BID) | ORAL | 1 refills | Status: DC

## 2021-06-18 MED ORDER — LEVETIRACETAM 1000 MG PO TABS: ORAL_TABLET | ORAL | 3 refills | Status: DC

## 2021-06-18 MED ORDER — ONDANSETRON HCL 8 MG PO TABS: 8.0000 mg | ORAL_TABLET | Freq: Three times a day (TID) | ORAL | 3 refills | Status: DC | PRN

## 2021-06-18 NOTE — Progress Notes (Signed)
[facility] [place], [place] [address] [phone]   Interval Evaluation  Date of Service: 06/18/21 Patient Name: Beverly Ballard Patient MRN: [id number] Patient DOB: 11-16-1945 Provider: Ventura Sellers, MD  Identifying Statement:  Beverly Ballard is a 76 y.o. female with left parietal  high grade glioma, IDH-1 wild type    Oncologic History: Oncology History  High grade glioma not classifiable by WHO criteria ([place])  02/17/2021 Initial Diagnosis   High grade glioma not classifiable by WHO criteria ([place])   02/22/2021 Cancer Staging   Staging form: Brain and Spinal Cord, AJCC 8th Edition - Pathologic stage from 02/22/2021: WHO Grade IV - Signed by Beverly Sellers, MD on 03/18/2021 Stage prefix: Initial diagnosis Histologic grading system: 4 grade system Extent of surgical resection: Subtotal resection Solitary (s) or multifocal (m) tumors in the primary site: Solitary Tumor location in brain: Eloquent brain area Karnofsky performance status: Score 80 Seizures at presentation: Present Duration of symptoms before diagnosis: Short IDH1 mutation: Negative    04/08/2021 -  Radiation Therapy   IMRT and concurrent Temodar with Dr. Isidore Moos     Biomarkers:  MGMT Unknown.  IDH 1/2 Wild type.  EGFR Unknown  TERT Unknown   Interval History: Beverly Ballard presents for follow up today, now having completed full course of IMRT and Temodar.  Unfortunately she experienced a seizure in the waiting room of the cancer center.  She is still confused and lethargic following this incident; apparently there was an additional seizure event last night as well.  Otherwise husband reports stability with regards to short term memory, language expression, balance/gait, and overall energy.  Continues to experience anxiety, frustration at her situation.  Nausea is still present despite having finished RT.  H+P (03/18/21) Patient presented to  medical attention at the end of October 2022 with new type of seizure event, described as "shaking all over, lost consciousness".  She carries history of epilepsy going back to childhood, had been stable on Zonisamide for several years.  CNS imgaging demonstrated L parietal mass.  She underwent craniotomy and debulking resection with Dr. Ronnald Ballard on 02/22/21; path demonstrated high grade glioma, IDH-1 wt.  Since surgery she has been rehospitalized for further breakthrough seizures, now on both Keppra and Zonegran.  She was also recently treated for recurrent pneumonia with oral antibiotics.  At present, she feels very impaired with regards to cognition, comprehension of language, and balance.  She is ambulating with a walker or wheelchair, also limited by long standing orthopedic issues.  She has recurrent nausea and shortness of breath which are both chronic issues.  Lives at home with her husband who also has cancer.  Medications: Current Outpatient Medications on File Prior to Visit  Medication Sig Dispense Refill   albuterol (VENTOLIN HFA) 108 (90 Base) MCG/ACT inhaler Inhale 1-2 puffs into the lungs every 6 (six) hours as needed for wheezing or shortness of breath. 8 g 0   busPIRone (BUSPAR) 10 MG tablet TAKE 1 TABLET BY MOUTH TWICE A DAY 180 tablet 0   cholecalciferol (VITAMIN D3) 25 MCG (1000 UNIT) tablet Take 1,000 Units by mouth every morning.     EPINEPHrine 0.3 mg/0.3 mL IJ SOAJ injection Inject 0.3 mg into the muscle once as needed for anaphylaxis.     esomeprazole (NEXIUM) 20 MG capsule Take 2 capsules (40 mg total) by mouth daily before breakfast. (Patient taking differently: Take 20 mg by mouth daily before  breakfast.) 2 capsule 0   furosemide (LASIX) 20 MG tablet Daily prn leg swelling 90 tablet 1   gabapentin (NEURONTIN) 300 MG capsule Take 1 capsule every night 30 capsule 6   HYDROcodone-acetaminophen (NORCO) 5-325 MG tablet Take 1 tablet by mouth 3 (three) times daily as needed. 30 tablet  0   levETIRAcetam (KEPPRA) 500 MG tablet Take 1 and 1/2 tablets twice a day 270 tablet 3   levothyroxine (SYNTHROID) 88 MCG tablet Take 1 tablet (88 mcg total) by mouth daily. (Patient taking differently: Take 88 mcg by mouth daily before breakfast.) 90 tablet 3   ondansetron (ZOFRAN) 8 MG tablet Take 1 tablet (8 mg total) by mouth 3 (three) times daily as needed (nausea and vomiting). May take 30-60 minutes prior to Temodar administration if nausea/vomiting occurs. 30 tablet 3   pravastatin (PRAVACHOL) 20 MG tablet Take 1 tablet (20 mg total) by mouth at bedtime. 90 tablet 1   prochlorperazine (COMPAZINE) 10 MG tablet Take 1 tablet (10 mg total) by mouth every 6 (six) hours as needed for nausea or vomiting. 30 tablet 3   sertraline (ZOLOFT) 100 MG tablet TAKE 2 TABLETS BY MOUTH EVERY DAY 180 tablet 1   solifenacin (VESICARE) 10 MG tablet Take 10 mg by mouth at bedtime.     Spacer/Aero-Holding Chambers (AEROCHAMBER PLUS) inhaler Use as instructed 1 each 0   temozolomide (TEMODAR) 140 MG capsule Take 1 capsule (140 mg total) by mouth daily. (Take in addition to 20 mg capsule). May take on an empty stomach to decrease nausea & vomiting. 42 capsule 0   temozolomide (TEMODAR) 20 MG capsule Take 1 capsule (20 mg total) by mouth daily. (Take in addition to 140 mg capsule). May take on an empty stomach to decrease nausea & vomiting. 42 capsule 0   vitamin B-12 (CYANOCOBALAMIN) 1000 MCG tablet Take 1,000 mcg by mouth every morning.     zonisamide (ZONEGRAN) 100 MG capsule Take 2 capsules twice a day 360 capsule 3   No current facility-administered medications on file prior to visit.    Allergies:  Allergies  Allergen Reactions   Lamotrigine Swelling    Tongue swelling   Bupropion Other (See Comments)    Unknown reaction   Carbamazepine Other (See Comments)    Hypnatremia   Levetiracetam Other (See Comments)    dizziness   Tramadol Other (See Comments)    Unknown reaction   Adhesive [Tape] Other  (See Comments)    ?  Blister after knee surgery   Amitriptyline Nausea And Vomiting   Clarithromycin Other (See Comments)    Unknown reaction     Doxycycline Other (See Comments)    Interfered with seizure medication   Nickel Other (See Comments)    Had to remove knee implant with nickel and replace it   Other Other (See Comments)    Allergic to Metal and perfumes (unknown reaction)   Topamax [Topiramate] Nausea And Vomiting   Estradiol Rash   Latex Rash   Phenytoin Sodium Extended Other (See Comments)    drowsiness   Past Medical History:  Past Medical History:  Diagnosis Date   Allergy    Anemia    Anxiety and depression 12/19/2009   Arthritis    Cancer ([place])    vaginal   Degenerative disorder of eye    Diverticulosis    Dysrhythmia 01/2021   Atrial fib/flutter   Essential hypertension, benign 06/26/2009   GERD 05/27/2007   takes Nexium daily   History of  gout    History of migraine many yrs ago   Hyperlipidemia    takes Pravastatin daily   Hypothyroidism    Insomnia    but doesn't take any meds   Osteoporosis    Seizures ([place])    last 1983, none since then- last seizure 1988 per pt    Sleep apnea    wears CPAP   Past Surgical History:  Past Surgical History:  Procedure Laterality Date   ADENOIDECTOMY     APPLICATION OF CRANIAL NAVIGATION N/A 02/22/2021   Procedure: APPLICATION OF CRANIAL NAVIGATION;  Surgeon: Eustace Moore, MD;  Location: [facility];  Service: Neurosurgery;  Laterality: N/A;   BACK SURGERY     lumbar x2   BRONCHIAL BIOPSY  09/12/2019   Procedure: BRONCHIAL BIOPSIES;  Surgeon: Laurin Coder, MD;  Location: [facility];  Service: Endoscopy;;   BRONCHIAL WASHINGS  09/12/2019   Procedure: BRONCHIAL WASHINGS;  Surgeon: Laurin Coder, MD;  Location: [facility];  Service: Endoscopy;;   COLONOSCOPY  2009   CRANIOTOMY N/A 02/22/2021   Procedure: CRANIOTOMY FOR TUMOR EXCISION;  Surgeon: Eustace Moore, MD;  Location: [facility];  Service:  Neurosurgery;  Laterality: N/A;   ESOPHAGOGASTRODUODENOSCOPY     Heel spurs Bilateral    HEMOSTASIS CONTROL  09/12/2019   Procedure: HEMOSTASIS CONTROL;  Surgeon: Laurin Coder, MD;  [facility]: [facility];  Service: Endoscopy;;  epi   KNEE ARTHROPLASTY Left 11/07/2013   Procedure: COMPUTER ASSISTED TOTAL KNEE ARTHROPLASTY;  Surgeon: Marybelle Killings, MD;  Location: [facility];  Service: Orthopedics;  Laterality: Left;  Left Total Knee Arthroplasty, Cemented, Computer Assist   TONSILLECTOMY     TOTAL KNEE REVISION Left 09/19/2014   Procedure: LEFT TOTAL KNEE REVISION;  Surgeon: Leandrew Koyanagi, MD;  Location: [facility];  Service: Orthopedics;  Laterality: Left;   UPPER GASTROINTESTINAL ENDOSCOPY     VIDEO BRONCHOSCOPY N/A 09/12/2019   Procedure: VIDEO BRONCHOSCOPY WITH FLUORO;  Surgeon: Laurin Coder, MD;  [facility];  Service: Endoscopy;  Laterality: N/A;   Social History:  Social History   Socioeconomic History   Marital status: Married    Spouse name: Not on file   Number of children: 1   Years of education: Not on file   Highest education level: 12th grade  Occupational History   Occupation: Retired  Tobacco Use   Smoking status: Former    Packs/day: 0.25    Years: 20.00    Pack years: 5.00    Types: Cigarettes    Quit date: 09/08/1978    Years since quitting: 42.8   Smokeless tobacco: Never   Tobacco comments:    quit smoking 70yr ago  Vaping Use   Vaping Use: Never used  Substance and Sexual Activity   Alcohol use: No   Drug use: No   Sexual activity: Not Currently    Birth control/protection: Post-menopausal  Other Topics Concern   Not on file  Social History Narrative   Right handed    Lives with husband    Social Determinants of Health   Financial Resource Strain: Low Risk    Difficulty of Paying Living Expenses: Not very hard  Food Insecurity: Unknown   Worried About RCharity fundraiserin the Last Year: Patient refused   RArboriculturistin the Last  Year: Never true  Transportation Needs: No Transportation Needs   Lack of Transportation (Medical): No   Lack of Transportation (Non-Medical): No  Physical Activity:  Unknown   Days of Exercise per Week: 1 day   Minutes of Exercise per Session: Patient refused  Stress: Stress Concern Present   Feeling of Stress : Very much  Social Connections: Moderately Isolated   Frequency of Communication with Friends and Family: Three times a week   Frequency of Social Gatherings with Friends and Family: Three times a week   Attends Religious Services: Never   Active Member of Clubs or Organizations: No   Attends Archivist Meetings: Never   Marital Status: Married  Human resources officer Violence: Not At Risk   Fear of Current or Ex-Partner: No   Emotionally Abused: No   Physically Abused: No   Sexually Abused: No   Family History:  Family History  Problem Relation Age of Onset   Aortic dissection Father    Arthritis Mother    Stroke Mother 48   Heart murmur Other    Throat cancer Sister    Colon cancer Neg Hx    Esophageal cancer Neg Hx    Stomach cancer Neg Hx    Rectal cancer Neg Hx    Colon polyps Neg Hx     Review of Systems: Constitutional: Doesn't report fevers, chills or abnormal weight loss Eyes: Doesn't report blurriness of vision Ears, nose, mouth, throat, and face: Doesn't report sore throat Respiratory: Doesn't report cough, dyspnea or wheezes Cardiovascular: Doesn't report palpitation, chest discomfort  Gastrointestinal:  Doesn't report nausea, constipation, diarrhea GU: Doesn't report incontinence Skin: Doesn't report skin rashes Neurological: Per HPI Musculoskeletal: Doesn't report joint pain Behavioral/Psych: Doesn't report anxiety  Physical Exam: Vitals:   06/18/21 0947  BP: (!) 161/70  Pulse: 73  Resp: 20  Temp: 98.7 F (37.1 C)  SpO2: 100%   KPS: 70 General: Alert, cooperative, pleasant, in no acute distress Head: Normal EENT: No conjunctival  injection or scleral icterus.  Lungs: Resp effort normal Cardiac: Regular rate Abdomen: Non-distended abdomen Skin: No rashes cyanosis or petechiae. Extremities: No clubbing or edema  Neurologic Exam: Mental Status: Awake, alert, attentive to examiner. Oriented to self and environment. Language is fluent with intact comprehension.  Advanced pscyhomotor slowing, mood lability noted. Cranial Nerves: Visual acuity is grossly normal. Visual fields are full. Extra-ocular movements intact. No ptosis. Face is symmetric Motor: Tone and bulk are normal. Power is full in both arms and legs. Reflexes are symmetric, no pathologic reflexes present.  Sensory: Intact to light touch Gait: Dystaxic   Labs: I have reviewed the data as listed    Component Value Date/Time   NA 139 05/21/2021 0930   NA 142 11/12/2015 0000   K 4.3 05/21/2021 0930   CL 102 05/21/2021 0930   CO2 31 05/21/2021 0930   GLUCOSE 105 (H) 05/21/2021 0930   BUN 13 05/21/2021 0930   BUN 8 11/12/2015 0000   CREATININE 1.02 (H) 05/21/2021 0930   CREATININE 0.72 03/19/2020 1301   CALCIUM 9.4 05/21/2021 0930   PROT 7.3 05/21/2021 0930   ALBUMIN 4.1 05/21/2021 0930   AST 22 05/21/2021 0930   ALT 13 05/21/2021 0930   ALKPHOS 58 05/21/2021 0930   BILITOT 0.4 05/21/2021 0930   GFRNONAA 58 (L) 05/21/2021 0930   GFRNONAA 80 10/05/2015 1105   GFRAA >60 12/20/2019 1714   GFRAA >89 10/05/2015 1105   Lab Results  Component Value Date   WBC 5.9 05/21/2021   NEUTROABS 4.7 05/21/2021   HGB 13.7 05/21/2021   HCT 41.6 05/21/2021   MCV 97.4 05/21/2021   PLT 162  05/21/2021   Imaging:  [facility] Clinician Interpretation: I have personally reviewed the CNS images as listed.  My interpretation, in the context of the patient's clinical presentation, is treatment effect vs true progression  MR BRAIN W WO CONTRAST  Result Date: 06/15/2021 CLINICAL DATA:  Brain/CNS neoplasm, assess treatment response; high-grade glioma EXAM: MRI HEAD WITHOUT  AND WITH CONTRAST TECHNIQUE: Multiplanar, multiecho pulse sequences of the brain and surrounding structures were obtained without and with intravenous contrast. CONTRAST:  67m GADAVIST GADOBUTROL 1 MMOL/ML IV SOLN COMPARISON:  02/23/2021 FINDINGS: Brain: Resolution of immediate postoperative changes. There is an irregular enhancing lesion at encompassing the resection site, which has increased in size. Surrounding T2 FLAIR hyperintensity has decreased in some areas but increased in others. There is decreased mass effect on the adjacent posterior left lateral ventricle. No acute infarction or hemorrhage. There are chronic blood products at the resection site. No hydrocephalus. Trace extra-axial collection underlying the craniotomy. Vascular: Major vessel flow voids at the skull base are preserved. Skull and upper cervical spine: Normal marrow signal is preserved. Sinuses/Orbits: Paranasal sinuses are aerated. Orbits are unremarkable. Other: Sella is unremarkable.  Mastoid air cells are clear. IMPRESSION: Increased enhancement about the resection site. Mixed changes in extent of surrounding T2 FLAIR hyperintensity. May reflect progression or pseudoprogression. Electronically Signed   By: PMacy MisM.D.   On: 06/15/2021 13:09    Assessment/Plan High grade glioma not classifiable by WHO criteria ([facility]  Seizure disorder ([place]  SLeanor Kailpresents today with breakthrough seizures, likely secondary to localized post-radiation inflammatory process.  She is presently post-ictal given event which occurred in the patient waiting area.   We recommended increasing Keppra to 10024mBID, will con't Zonegran 20042mID.  Decadron will be initiated at 4mg6mD given role of inflammation here.   We deferred discussion of MRI and treatment plan given this event(s).    We ask that SheiWESLEE PRESTAGEurn to clinic in 1 week for review of MRI and treatment plan discussion.  All questions were answered. The patient  knows to call the clinic with any problems, questions or concerns. No barriers to learning were detected.  The total time spent in the encounter was 40 minutes and more than 50% was on counseling and review of test results   ZachVentura Ballard Medical Director of Neuro-Oncology [facility] Medical CenterWeslGreasy28/23 9:51 AM

## 2021-06-20 DIAGNOSIS — M542 Cervicalgia: Secondary | ICD-10-CM | POA: Diagnosis not present

## 2021-06-20 DIAGNOSIS — D496 Neoplasm of unspecified behavior of brain: Secondary | ICD-10-CM | POA: Diagnosis not present

## 2021-06-21 ENCOUNTER — Ambulatory Visit
Admission: RE | Admit: 2021-06-21 | Discharge: 2021-06-21 | Disposition: A | Discharge status: Home / Self Care | Payer: Medicare HMO | Source: Ambulatory Visit | Attending: Radiation Oncology | Admitting: Radiation Oncology

## 2021-06-21 ENCOUNTER — Encounter: Payer: Self-pay | Admitting: Radiation Oncology

## 2021-06-21 DIAGNOSIS — C719 Malignant neoplasm of brain, unspecified: Secondary | ICD-10-CM

## 2021-06-21 NOTE — Progress Notes (Signed)
?Radiation Oncology         ([phone] ?________________________________ ? ?Name: Beverly Ballard MRN: [id number]  ?Date: 06/21/2021  DOB: [birth year] ? ?Follow-Up Visit Note ?Outpatient by telemedicine to maximize safety during the pandemic.  MyChart video was used. ? ? ?CC: Caren Macadam, MD  Caren Macadam, MD ? ?  ?Diagnoses: C71.3-Malignant neoplasm of parietal lobe ? ?Cancer Staging:  Cancer Staging  ?High grade glioma not classifiable by WHO criteria ([place]) ?Staging form: Brain and Spinal Cord, AJCC 8th Edition ?- Pathologic stage from 02/22/2021: WHO Grade IV - Signed by Ventura Sellers, MD on 03/18/2021 ?Stage prefix: Initial diagnosis ?Histologic grading system: 4 grade system ?Extent of surgical resection: Subtotal resection ?Solitary (s) or multifocal (m) tumors in the primary site: Solitary ?Tumor location in brain: Eloquent brain area ?Karnofsky performance status: Score 80 ?Seizures at presentation: Present ?Duration of symptoms before diagnosis: Short ?IDH1 mutation: Negative ? ?Intent: Curative ? ?Radiation Treatment Dates: 04/08/2021 through 05/23/2021 ?Site Technique Total Dose (Gy) Dose per Fx (Gy) Completed Fx Beam Energies  ?Parietal Lobe: Brain_L_[place] IMRT 46/46 2 23/23 6X  ?Parietal Lobe: Brain_Bst_L_[place] IMRT 14/14 2 7/7 6X  ? ?CHIEF COMPLAINT: Here for follow-up and surveillance of brain cancer ? ?Narrative:  The patient returns today for routine follow-up by MyChart Video. Husband is present. She is recovering from recent seizure activity for which Dr. Mickeal Skinner Rx'd steroids [facility] 4mg  BID) as well as increased Keppra to 1000mg  BID, and continued Zonegran 200mg  BID.  She reports that her worst symptom is pain radiating from neck down left shoulder.  Also has HAs.  Dr Ronnald Ramp - per patient - did not think this was from her craniotomy.  She was Dr Erlinda Hong who believed this was from cervical spine degenerative disease and radiculopathy. Since that appt 1.5 wks ago she has been  waiting to get an appt w/ Dr Ernestina Patches of [facility] for an injection to help this pain.          ? ?ALLERGIES:  is allergic to lamotrigine, bupropion, carbamazepine, levetiracetam, tramadol, adhesive [tape], amitriptyline, clarithromycin, doxycycline, nickel, other, topamax [topiramate], estradiol, latex, and phenytoin sodium extended. ? ?Meds: ?Current Outpatient Medications  ?Medication Sig Dispense Refill  ? albuterol (VENTOLIN HFA) 108 (90 Base) MCG/ACT inhaler Inhale 1-2 puffs into the lungs every 6 (six) hours as needed for wheezing or shortness of breath. 8 g 0  ? busPIRone (BUSPAR) 10 MG tablet TAKE 1 TABLET BY MOUTH TWICE A DAY 180 tablet 0  ? cholecalciferol (VITAMIN D3) 25 MCG (1000 UNIT) tablet Take 1,000 Units by mouth every morning.    ? dexamethasone (DECADRON) 4 MG tablet Take 1 tablet (4 mg total) by mouth 2 (two) times daily with a meal. 60 tablet 1  ? EPINEPHrine 0.3 mg/0.3 mL IJ SOAJ injection Inject 0.3 mg into the muscle once as needed for anaphylaxis.    ? esomeprazole (NEXIUM) 20 MG capsule Take 2 capsules (40 mg total) by mouth daily before breakfast. (Patient taking differently: Take 20 mg by mouth daily before breakfast.) 2 capsule 0  ? furosemide (LASIX) 20 MG tablet Daily prn leg swelling 90 tablet 1  ? gabapentin (NEURONTIN) 300 MG capsule Take 1 capsule every night 30 capsule 6  ? HYDROcodone-acetaminophen (NORCO) 5-325 MG tablet Take 1 tablet by mouth 3 (three) times daily as needed. 30 tablet 0  ? levETIRAcetam (KEPPRA) 1000 MG tablet Take 1 and 1/2 tablets twice a day 60 tablet 3  ? levothyroxine (SYNTHROID) 88 MCG  tablet Take 1 tablet (88 mcg total) by mouth daily. (Patient taking differently: Take 88 mcg by mouth daily before breakfast.) 90 tablet 3  ? ondansetron (ZOFRAN) 8 MG tablet Take 1 tablet (8 mg total) by mouth 3 (three) times daily as needed (nausea and vomiting). May take 30-60 minutes prior to Temodar administration if nausea/vomiting occurs. 30 tablet 3  ? pravastatin  (PRAVACHOL) 20 MG tablet Take 1 tablet (20 mg total) by mouth at bedtime. 90 tablet 1  ? prochlorperazine (COMPAZINE) 10 MG tablet Take 1 tablet (10 mg total) by mouth every 6 (six) hours as needed for nausea or vomiting. 30 tablet 3  ? sertraline (ZOLOFT) 100 MG tablet TAKE 2 TABLETS BY MOUTH EVERY DAY 180 tablet 1  ? solifenacin (VESICARE) 10 MG tablet Take 10 mg by mouth at bedtime.    ? Spacer/Aero-Holding Chambers (AEROCHAMBER PLUS) inhaler Use as instructed 1 each 0  ? temozolomide (TEMODAR) 140 MG capsule Take 1 capsule (140 mg total) by mouth daily. (Take in addition to 20 mg capsule). May take on an empty stomach to decrease nausea & vomiting. 42 capsule 0  ? temozolomide (TEMODAR) 20 MG capsule Take 1 capsule (20 mg total) by mouth daily. (Take in addition to 140 mg capsule). May take on an empty stomach to decrease nausea & vomiting. 42 capsule 0  ? vitamin B-12 (CYANOCOBALAMIN) 1000 MCG tablet Take 1,000 mcg by mouth every morning.    ? zonisamide (ZONEGRAN) 100 MG capsule Take 2 capsules twice a day 360 capsule 3  ? ?No current facility-administered medications for this encounter.  ? ? ?Physical Findings: ?The patient is in no acute distress. Patient is alert  ? vitals were not taken for this visit. .    ? ? ?Lab Findings: ?Lab Results  ?Component Value Date  ? WBC 5.9 05/21/2021  ? HGB 13.7 05/21/2021  ? HCT 41.6 05/21/2021  ? MCV 97.4 05/21/2021  ? PLT 162 05/21/2021  ? ? ?Radiographic Findings: ?MR BRAIN W WO CONTRAST ? ?Result Date: 06/15/2021 ?CLINICAL DATA:  Brain/CNS neoplasm, assess treatment response; high-grade glioma EXAM: MRI HEAD WITHOUT AND WITH CONTRAST TECHNIQUE: Multiplanar, multiecho pulse sequences of the brain and surrounding structures were obtained without and with intravenous contrast. CONTRAST:  44mL GADAVIST GADOBUTROL 1 MMOL/ML IV SOLN COMPARISON:  02/23/2021 FINDINGS: Brain: Resolution of immediate postoperative changes. There is an irregular enhancing lesion at encompassing  the resection site, which has increased in size. Surrounding T2 FLAIR hyperintensity has decreased in some areas but increased in others. There is decreased mass effect on the adjacent posterior left lateral ventricle. No acute infarction or hemorrhage. There are chronic blood products at the resection site. No hydrocephalus. Trace extra-axial collection underlying the craniotomy. Vascular: Major vessel flow voids at the skull base are preserved. Skull and upper cervical spine: Normal marrow signal is preserved. Sinuses/Orbits: Paranasal sinuses are aerated. Orbits are unremarkable. Other: Sella is unremarkable.  Mastoid air cells are clear. IMPRESSION: Increased enhancement about the resection site. Mixed changes in extent of surrounding T2 FLAIR hyperintensity. May reflect progression or pseudoprogression. Electronically Signed   By: Macy Mis M.D.   On: 06/15/2021 13:09   ? ?Impression/Plan:  I shared her MRI results with patient and husband.  Results are inconclusive and will require sequential imaging to see whether tumor bed settles down or worsens.  She will followup with Dr. Mickeal Skinner and get more imaging at an appropriate time with him. ? ?Our team will reach out to Dr Haze Rushing  [place] to make sure he is aware of Dr. Phoebe Sharps recommendations for injection to address radiculopathy. The pain is quite severe, per patient. ? ?I will see her PRN. She and her husband known to reach out with any needs moving forward. ? ?This encounter was provided by telemedicine platform; patient desired telemedicine during pandemic precautions. (Mychart video) was used. ?The patient has given verbal consent for this type of encounter and has been advised to only accept a meeting of this type in a secure network environment. ?On date of service, in total, I spent 25 minutes on this encounter.   ?The attendants for this meeting include Eppie Gibson  and Leanor Kail ?During the encounter, Eppie Gibson was located at [facility] Radiation Oncology Department.  ?LUAN URBANI was located at home.  ? ?_____________________________________ ? ? ?Eppie Gibson, MD ? ?

## 2021-06-25 ENCOUNTER — Inpatient Hospital Stay: Payer: Medicare HMO | Attending: Internal Medicine | Admitting: Internal Medicine

## 2021-06-25 ENCOUNTER — Other Ambulatory Visit (HOSPITAL_COMMUNITY): Payer: Self-pay

## 2021-06-25 ENCOUNTER — Other Ambulatory Visit: Payer: Self-pay

## 2021-06-25 ENCOUNTER — Telehealth: Payer: Self-pay | Admitting: Pharmacist

## 2021-06-25 VITALS — BP 151/72 | HR 85 | Temp 98.3°F | Resp 20 | Ht 65.0 in | Wt 226.6 lb

## 2021-06-25 DIAGNOSIS — G40909 Epilepsy, unspecified, not intractable, without status epilepticus: Secondary | ICD-10-CM | POA: Insufficient documentation

## 2021-06-25 DIAGNOSIS — Z87891 Personal history of nicotine dependence: Secondary | ICD-10-CM | POA: Diagnosis not present

## 2021-06-25 DIAGNOSIS — C719 Malignant neoplasm of brain, unspecified: Secondary | ICD-10-CM | POA: Diagnosis not present

## 2021-06-25 DIAGNOSIS — Z79899 Other long term (current) drug therapy: Secondary | ICD-10-CM | POA: Diagnosis not present

## 2021-06-25 DIAGNOSIS — C713 Malignant neoplasm of parietal lobe: Secondary | ICD-10-CM | POA: Insufficient documentation

## 2021-06-25 MED ORDER — ONDANSETRON HCL 8 MG PO TABS
8.0000 mg | ORAL_TABLET | Freq: Two times a day (BID) | ORAL | 1 refills | Status: DC | PRN
  Filled 2021-06-25: qty 30, 15d supply, fill #0

## 2021-06-25 MED ORDER — TEMOZOLOMIDE 140 MG PO CAPS
140.0000 mg | ORAL_CAPSULE | Freq: Every day | ORAL | 0 refills | Status: DC
  Filled 2021-06-25: qty 5, 5d supply, fill #0

## 2021-06-25 MED ORDER — TEMOZOLOMIDE 180 MG PO CAPS
180.0000 mg | ORAL_CAPSULE | Freq: Every day | ORAL | 0 refills | Status: DC
  Filled 2021-06-25: qty 5, 5d supply, fill #0

## 2021-06-25 NOTE — Telephone Encounter (Signed)
Oral Oncology Pharmacist Encounter ? ?Received new prescription for Temodar (temozolomide) for the treatment of high grade glioma planned duration ~6-12 cycles. ? ?CBC w/ Diff and CMP from 05/21/21 assessed, no relevant lab abnormalities noted. Prescription dose and frequency assessed for appropriateness. Appropriate for therapy initiation.  ? ?Current medication list in Epic reviewed, no relevant/significant DDIs with Temodar identified. ? ?Evaluated chart and no patient barriers to medication adherence noted.  ? ?Patient agreement for treatment documented in MD note on 06/25/21. ? ?Prescription has been e-scribed to the [facility] for benefits analysis and approval. ? ?Patient's copay for first fill of Temodar is $696.62 ($414 for #5 180 mg capsules and $282.62 for #5 140 mg capsules) ? ?Oral Oncology Clinic will continue to follow for insurance authorization, copayment issues, initial counseling and start date. ? ?Leron Croak, PharmD, BCPS ?Hematology/Oncology Clinical Pharmacist ?[facility] Oral Chemotherapy Navigation Clinics ?[phone] ?06/25/2021 3:52 PM ? ?

## 2021-06-25 NOTE — Progress Notes (Signed)
[facility] [place], [place] [address] [phone]   Interval Evaluation  Date of Service: 06/25/21 Patient Name: Beverly Ballard Patient MRN: [id number] Patient DOB: October 24, 1945 Provider: Ventura Sellers, MD  Identifying Statement:  Beverly Ballard is a 76 y.o. female with left parietal  high grade glioma, IDH-1 wild type    Oncologic History: Oncology History  High grade glioma not classifiable by WHO criteria ([place])  02/17/2021 Initial Diagnosis   High grade glioma not classifiable by WHO criteria ([place])   02/22/2021 Cancer Staging   Staging form: Brain and Spinal Cord, AJCC 8th Edition - Pathologic stage from 02/22/2021: WHO Grade IV - Signed by Ventura Sellers, MD on 03/18/2021 Stage prefix: Initial diagnosis Histologic grading system: 4 grade system Extent of surgical resection: Subtotal resection Solitary (s) or multifocal (m) tumors in the primary site: Solitary Tumor location in brain: Eloquent brain area Karnofsky performance status: Score 80 Seizures at presentation: Present Duration of symptoms before diagnosis: Short IDH1 mutation: Negative    04/08/2021 -  Radiation Therapy   IMRT and concurrent Temodar with Dr. Isidore Moos     Biomarkers:  MGMT Unknown.  IDH 1/2 Wild type.  EGFR Unknown  TERT Unknown   Interval History: Beverly Ballard presents for follow up today, now one week removed from breakthrough seizures.  She is significantly improved on the higher dose of Keppra and decadron, no further seizure events.  She has had a bit more energy to get outside, though remains quite fatigued and "foggy".  Continues to experience anxiety, frustration at her situation.  Doesn't complain of nausea today.  H+P (03/18/21) Patient presented to medical attention at the end of October 2022 with new type of seizure event, described as "shaking all over, lost consciousness".  She carries history of epilepsy going back to  childhood, had been stable on Zonisamide for several years.  CNS imgaging demonstrated L parietal mass.  She underwent craniotomy and debulking resection with Dr. Ronnald Ramp on 02/22/21; path demonstrated high grade glioma, IDH-1 wt.  Since surgery she has been rehospitalized for further breakthrough seizures, now on both Keppra and Zonegran.  She was also recently treated for recurrent pneumonia with oral antibiotics.  At present, she feels very impaired with regards to cognition, comprehension of language, and balance.  She is ambulating with a walker or wheelchair, also limited by long standing orthopedic issues.  She has recurrent nausea and shortness of breath which are both chronic issues.  Lives at home with her husband who also has cancer.  Medications: Current Outpatient Medications on File Prior to Visit  Medication Sig Dispense Refill   albuterol (VENTOLIN HFA) 108 (90 Base) MCG/ACT inhaler Inhale 1-2 puffs into the lungs every 6 (six) hours as needed for wheezing or shortness of breath. 8 g 0   busPIRone (BUSPAR) 10 MG tablet TAKE 1 TABLET BY MOUTH TWICE A DAY 180 tablet 0   cholecalciferol (VITAMIN D3) 25 MCG (1000 UNIT) tablet Take 1,000 Units by mouth every morning.     dexamethasone (DECADRON) 4 MG tablet Take 1 tablet (4 mg total) by mouth 2 (two) times daily with a meal. 60 tablet 1   esomeprazole (NEXIUM) 20 MG capsule Take 2 capsules (40 mg total) by mouth daily before breakfast. (Patient taking differently: Take 20 mg by mouth daily before breakfast.) 2 capsule 0   furosemide (LASIX) 20 MG tablet Daily prn leg swelling 90 tablet 1  gabapentin (NEURONTIN) 300 MG capsule Take 1 capsule every night 30 capsule 6   levETIRAcetam (KEPPRA) 1000 MG tablet Take 1 and 1/2 tablets twice a day 60 tablet 3   levothyroxine (SYNTHROID) 88 MCG tablet Take 1 tablet (88 mcg total) by mouth daily. (Patient taking differently: Take 88 mcg by mouth daily before breakfast.) 90 tablet 3   pravastatin  (PRAVACHOL) 20 MG tablet Take 1 tablet (20 mg total) by mouth at bedtime. 90 tablet 1   sertraline (ZOLOFT) 100 MG tablet TAKE 2 TABLETS BY MOUTH EVERY DAY 180 tablet 1   solifenacin (VESICARE) 10 MG tablet Take 10 mg by mouth at bedtime.     Spacer/Aero-Holding Chambers (AEROCHAMBER PLUS) inhaler Use as instructed 1 each 0   temozolomide (TEMODAR) 140 MG capsule Take 1 capsule (140 mg total) by mouth daily. (Take in addition to 20 mg capsule). May take on an empty stomach to decrease nausea & vomiting. 42 capsule 0   temozolomide (TEMODAR) 20 MG capsule Take 1 capsule (20 mg total) by mouth daily. (Take in addition to 140 mg capsule). May take on an empty stomach to decrease nausea & vomiting. 42 capsule 0   vitamin B-12 (CYANOCOBALAMIN) 1000 MCG tablet Take 1,000 mcg by mouth every morning.     zonisamide (ZONEGRAN) 100 MG capsule Take 2 capsules twice a day 360 capsule 3   EPINEPHrine 0.3 mg/0.3 mL IJ SOAJ injection Inject 0.3 mg into the muscle once as needed for anaphylaxis. (Patient not taking: Reported on 06/25/2021)     HYDROcodone-acetaminophen (NORCO) 5-325 MG tablet Take 1 tablet by mouth 3 (three) times daily as needed. (Patient not taking: Reported on 06/25/2021) 30 tablet 0   ondansetron (ZOFRAN) 8 MG tablet Take 1 tablet (8 mg total) by mouth 3 (three) times daily as needed (nausea and vomiting). May take 30-60 minutes prior to Temodar administration if nausea/vomiting occurs. (Patient not taking: Reported on 06/25/2021) 30 tablet 3   prochlorperazine (COMPAZINE) 10 MG tablet Take 1 tablet (10 mg total) by mouth every 6 (six) hours as needed for nausea or vomiting. (Patient not taking: Reported on 06/25/2021) 30 tablet 3   No current facility-administered medications on file prior to visit.    Allergies:  Allergies  Allergen Reactions   Lamotrigine Swelling    Tongue swelling   Bupropion Other (See Comments)    Unknown reaction   Carbamazepine Other (See Comments)    Hypnatremia    Levetiracetam Other (See Comments)    dizziness   Tramadol Other (See Comments)    Unknown reaction   Adhesive [Tape] Other (See Comments)    ?  Blister after knee surgery   Amitriptyline Nausea And Vomiting   Clarithromycin Other (See Comments)    Unknown reaction     Doxycycline Other (See Comments)    Interfered with seizure medication   Nickel Other (See Comments)    Had to remove knee implant with nickel and replace it   Other Other (See Comments)    Allergic to Metal and perfumes (unknown reaction)   Topamax [Topiramate] Nausea And Vomiting   Estradiol Rash   Latex Rash   Phenytoin Sodium Extended Other (See Comments)    drowsiness   Past Medical History:  Past Medical History:  Diagnosis Date   Allergy    Anemia    Anxiety and depression 12/19/2009   Arthritis    Cancer ([place])    vaginal   Degenerative disorder of eye    Diverticulosis  Dysrhythmia 01/2021   Atrial fib/flutter   Essential hypertension, benign 06/26/2009   GERD 05/27/2007   takes Nexium daily   History of gout    History of migraine many yrs ago   Hyperlipidemia    takes Pravastatin daily   Hypothyroidism    Insomnia    but doesn't take any meds   Osteoporosis    Seizures ([place])    last 1983, none since then- last seizure 1988 per pt    Sleep apnea    wears CPAP   Past Surgical History:  Past Surgical History:  Procedure Laterality Date   ADENOIDECTOMY     APPLICATION OF CRANIAL NAVIGATION N/A 02/22/2021   Procedure: APPLICATION OF CRANIAL NAVIGATION;  Surgeon: Eustace Moore, MD;  Location: [facility];  Service: Neurosurgery;  Laterality: N/A;   BACK SURGERY     lumbar x2   BRONCHIAL BIOPSY  09/12/2019   Procedure: BRONCHIAL BIOPSIES;  Surgeon: Laurin Coder, MD;  Location: [facility];  Service: Endoscopy;;   BRONCHIAL WASHINGS  09/12/2019   Procedure: BRONCHIAL WASHINGS;  Surgeon: Laurin Coder, MD;  [facility]: [facility];  Service: Endoscopy;;   COLONOSCOPY  2009    CRANIOTOMY N/A 02/22/2021   Procedure: CRANIOTOMY FOR TUMOR EXCISION;  Surgeon: Eustace Moore, MD;  Location: [facility];  Service: Neurosurgery;  Laterality: N/A;   ESOPHAGOGASTRODUODENOSCOPY     Heel spurs Bilateral    HEMOSTASIS CONTROL  09/12/2019   Procedure: HEMOSTASIS CONTROL;  Surgeon: Laurin Coder, MD;  [facility];  Service: Endoscopy;;  epi   KNEE ARTHROPLASTY Left 11/07/2013   Procedure: COMPUTER ASSISTED TOTAL KNEE ARTHROPLASTY;  Surgeon: Marybelle Killings, MD;  Location: [facility];  Service: Orthopedics;  Laterality: Left;  Left Total Knee Arthroplasty, Cemented, Computer Assist   TONSILLECTOMY     TOTAL KNEE REVISION Left 09/19/2014   Procedure: LEFT TOTAL KNEE REVISION;  Surgeon: Leandrew Koyanagi, MD;  Location: [facility];  Service: Orthopedics;  Laterality: Left;   UPPER GASTROINTESTINAL ENDOSCOPY     VIDEO BRONCHOSCOPY N/A 09/12/2019   Procedure: VIDEO BRONCHOSCOPY WITH FLUORO;  Surgeon: Laurin Coder, MD;  [facility];  Service: Endoscopy;  Laterality: N/A;   Social History:  Social History   Socioeconomic History   Marital status: Married    Spouse name: Not on file   Number of children: 1   Years of education: Not on file   Highest education level: 12th grade  Occupational History   Occupation: Retired  Tobacco Use   Smoking status: Former    Packs/day: 0.25    Years: 20.00    Pack years: 5.00    Types: Cigarettes    Quit date: 09/08/1978    Years since quitting: 42.8   Smokeless tobacco: Never   Tobacco comments:    quit smoking 68yr ago  Vaping Use   Vaping Use: Never used  Substance and Sexual Activity   Alcohol use: No   Drug use: No   Sexual activity: Not Currently    Birth control/protection: Post-menopausal  Other Topics Concern   Not on file  Social History Narrative   Right handed    Lives with husband    Social Determinants of Health   Financial Resource Strain: Low Risk    Difficulty of Paying Living Expenses: Not very hard   Food Insecurity: Unknown   Worried About Running Out of Food in the Last Year: Patient refused   RChamain the Last Year:  Never true  Transportation Needs: No Transportation Needs   Lack of Transportation (Medical): No   Lack of Transportation (Non-Medical): No  Physical Activity: Unknown   Days of Exercise per Week: 1 day   Minutes of Exercise per Session: Patient refused  Stress: Stress Concern Present   Feeling of Stress : Very much  Social Connections: Moderately Isolated   Frequency of Communication with Friends and Family: Three times a week   Frequency of Social Gatherings with Friends and Family: Three times a week   Attends Religious Services: Never   Active Member of Clubs or Organizations: No   Attends Archivist Meetings: Never   Marital Status: Married  Human resources officer Violence: Not At Risk   Fear of Current or Ex-Partner: No   Emotionally Abused: No   Physically Abused: No   Sexually Abused: No   Family History:  Family History  Problem Relation Age of Onset   Aortic dissection Father    Arthritis Mother    Stroke Mother 24   Heart murmur Other    Throat cancer Sister    Colon cancer Neg Hx    Esophageal cancer Neg Hx    Stomach cancer Neg Hx    Rectal cancer Neg Hx    Colon polyps Neg Hx     Review of Systems: Constitutional: Doesn't report fevers, chills or abnormal weight loss Eyes: Doesn't report blurriness of vision Ears, nose, mouth, throat, and face: Doesn't report sore throat Respiratory: Doesn't report cough, dyspnea or wheezes Cardiovascular: Doesn't report palpitation, chest discomfort  Gastrointestinal:  Doesn't report nausea, constipation, diarrhea GU: Doesn't report incontinence Skin: Doesn't report skin rashes Neurological: Per HPI Musculoskeletal: Doesn't report joint pain Behavioral/Psych: Doesn't report anxiety  Physical Exam: Vitals:   06/25/21 1126  BP: (!) 151/72  Pulse: 85  Resp: 20  Temp: 98.3 F  (36.8 C)  SpO2: 95%   KPS: 70 General: Alert, cooperative, pleasant, in no acute distress Head: Normal EENT: No conjunctival injection or scleral icterus.  Lungs: Resp effort normal Cardiac: Regular rate Abdomen: Non-distended abdomen Skin: No rashes cyanosis or petechiae. Extremities: No clubbing or edema  Neurologic Exam: Mental Status: Awake, alert, attentive to examiner. Oriented to self and environment. Language is fluent with intact comprehension.  Advanced pscyhomotor slowing, mood lability noted. Cranial Nerves: Visual acuity is grossly normal. Visual fields are full. Extra-ocular movements intact. No ptosis. Face is symmetric Motor: Tone and bulk are normal. Power is full in both arms and legs. Reflexes are symmetric, no pathologic reflexes present.  Sensory: Intact to light touch Gait: Dystaxic   Labs: I have reviewed the data as listed    Component Value Date/Time   NA 139 05/21/2021 0930   NA 142 11/12/2015 0000   K 4.3 05/21/2021 0930   CL 102 05/21/2021 0930   CO2 31 05/21/2021 0930   GLUCOSE 105 (H) 05/21/2021 0930   BUN 13 05/21/2021 0930   BUN 8 11/12/2015 0000   CREATININE 1.02 (H) 05/21/2021 0930   CREATININE 0.72 03/19/2020 1301   CALCIUM 9.4 05/21/2021 0930   PROT 7.3 05/21/2021 0930   ALBUMIN 4.1 05/21/2021 0930   AST 22 05/21/2021 0930   ALT 13 05/21/2021 0930   ALKPHOS 58 05/21/2021 0930   BILITOT 0.4 05/21/2021 0930   GFRNONAA 58 (L) 05/21/2021 0930   GFRNONAA 80 10/05/2015 1105   GFRAA >60 12/20/2019 1714   GFRAA >89 10/05/2015 1105   Lab Results  Component Value Date   WBC 5.9  05/21/2021   NEUTROABS 4.7 05/21/2021   HGB 13.7 05/21/2021   HCT 41.6 05/21/2021   MCV 97.4 05/21/2021   PLT 162 05/21/2021   Imaging:  [place] Clinician Interpretation: I have personally reviewed the CNS images as listed.  My interpretation, in the context of the patient's clinical presentation, is treatment effect vs true progression  MR BRAIN W WO  CONTRAST  Result Date: 06/15/2021 CLINICAL DATA:  Brain/CNS neoplasm, assess treatment response; high-grade glioma EXAM: MRI HEAD WITHOUT AND WITH CONTRAST TECHNIQUE: Multiplanar, multiecho pulse sequences of the brain and surrounding structures were obtained without and with intravenous contrast. CONTRAST:  55m GADAVIST GADOBUTROL 1 MMOL/ML IV SOLN COMPARISON:  02/23/2021 FINDINGS: Brain: Resolution of immediate postoperative changes. There is an irregular enhancing lesion at encompassing the resection site, which has increased in size. Surrounding T2 FLAIR hyperintensity has decreased in some areas but increased in others. There is decreased mass effect on the adjacent posterior left lateral ventricle. No acute infarction or hemorrhage. There are chronic blood products at the resection site. No hydrocephalus. Trace extra-axial collection underlying the craniotomy. Vascular: Major vessel flow voids at the skull base are preserved. Skull and upper cervical spine: Normal marrow signal is preserved. Sinuses/Orbits: Paranasal sinuses are aerated. Orbits are unremarkable. Other: Sella is unremarkable.  Mastoid air cells are clear. IMPRESSION: Increased enhancement about the resection site. Mixed changes in extent of surrounding T2 FLAIR hyperintensity. May reflect progression or pseudoprogression. Electronically Signed   By: PMacy MisM.D.   On: 06/15/2021 13:09    Assessment/Plan High grade glioma not classifiable by WHO criteria ([facility]  Seizure disorder ([place]  SSHERRISE LIBERTOis clinically stable today, now with better control of focal seizures and localized inflammation.  MRI brain demonstrates changes consistent with pseudoprogression (favored) vs early progression of organic tumor.  We reviewed the images in detail at bedside today.    We recommended initiating treatment with Temozolomide 156mm2, on for five days and off for twenty three days in twenty eight day cycles. The patient will have a  complete blood count performed on days 21 and 28 of each cycle, and a comprehensive metabolic panel performed on day 28 of each cycle. Labs may need to be performed more often. Zofran will prescribed for home use for nausea/vomiting.   Informed consent was obtained verbally at bedside to proceed with oral chemotherapy.  Chemotherapy should be held for the following:  ANC less than 1,000  Platelets less than 100,000  LFT or creatinine greater than 2x ULN  If clinical concerns/contraindications develop  Should con't Keppra 100039mID, Zonegran 200m62mD.  Decadron should be decreased to 4mg 21mly.    We ask that SheilGETSEMANI LINDONrn to clinic in 1 months with labs for review prior to cycle #2, or sooner as needed.  All questions were answered. The patient knows to call the clinic with any problems, questions or concerns. No barriers to learning were detected.  The total time spent in the encounter was 30 minutes and more than 50% was on counseling and review of test results   ZachaVentura SellersMedical Director of Neuro-Oncology [facility] CenteresleLemmon7/23 11:40 AM

## 2021-06-25 NOTE — Progress Notes (Signed)
DISCONTINUE ON PATHWAY REGIMEN - Neuro ? ? ?  One cycle, concurrent with RT: ?    Temozolomide  ? ?**Always confirm dose/schedule in your pharmacy ordering system** ? ?REASON: Continuation Of Treatment ?PRIOR TREATMENT: [id number]: Radiation Therapy with Concurrent Temozolomide 75 mg/m2 Daily x 6 Weeks, Followed by Adjuvant Temozolomide ?TREATMENT RESPONSE: Stable Disease (SD) ? ?START ON PATHWAY REGIMEN - Neuro ? ? ?  A cycle is every 28 days: ?    Temozolomide  ?    Temozolomide  ? ?**Always confirm dose/schedule in your pharmacy ordering system** ? ?Patient Characteristics: ?Glioma, Glioblastoma, IDH-wildtype, Newly Diagnosed / Treatment Naive, Good Performance Status and/or Younger Patient, MGMT Promoter Unmethylated/Unknown ?Disease Classification: Glioma ?Disease Classification: Glioblastoma, IDH-wildtype ?Disease Status: Newly Diagnosed / Treatment Naive ?Performance Status: Good Performance Status and/or Younger Patient ?MGMT Promoter Methylation Status: Awaiting Test Results ?Intent of Therapy: ?Non-Curative / Palliative Intent, Discussed with Patient ?

## 2021-06-26 ENCOUNTER — Telehealth: Payer: Self-pay | Admitting: Internal Medicine

## 2021-06-26 MED ORDER — ONDANSETRON HCL 8 MG PO TABS: 8.0000 mg | ORAL_TABLET | Freq: Two times a day (BID) | ORAL | 1 refills | Status: DC | PRN

## 2021-06-26 MED ORDER — TEMOZOLOMIDE 180 MG PO CAPS: 180.0000 mg | ORAL_CAPSULE | Freq: Every day | ORAL | 0 refills | Status: DC

## 2021-06-26 MED ORDER — TEMOZOLOMIDE 140 MG PO CAPS: 140.0000 mg | ORAL_CAPSULE | Freq: Every day | ORAL | 0 refills | Status: DC

## 2021-06-26 NOTE — Telephone Encounter (Signed)
Oral Chemotherapy Pharmacist Encounter  ? ?Spoke with patient's spouse, Beverly Ballard, today to follow up regarding patient's oral chemotherapy medication: Temozolomide ? ?Due to high copay, patient's husband would like to have the temozolomide prescriptions and the ondansetron sent to [facility] (as previously done when patient was on concurrent XRT + TMZ) ? ?Prescriptions redirected to [facility] on [address] for dispensing. Will follow-up with patient's husband on 06/28/21. ? ?Leron Croak, PharmD, BCPS ?Hematology/Oncology Clinical Pharmacist ?[facility] Oral Chemotherapy Navigation Clinics ?[phone] ?06/26/2021 2:03 PM ? ? ? ? ?

## 2021-06-26 NOTE — Telephone Encounter (Signed)
Scheduled per 3/7 los, message has been left with pt ?

## 2021-06-28 NOTE — Telephone Encounter (Signed)
Oral Chemotherapy Pharmacist Encounter ? ?I spoke with patient and patient's husband for overview of: Temodar (temozolomide) for the treatment of high grade glioma planned duration ~6-12 cycles. ? ?Counseled on administration, dosing, side effects, monitoring, drug-food interactions, safe handling, storage, and disposal. ? ?Patient will take Temodar '140mg'$  capsules and Temodar '180mg'$  capsules, '320mg'$  total daily dose, by mouth once daily, may take at bedtime and on an empty stomach to decrease nausea and vomiting. ? ?If 1st cycle is well tolerated, patient informed that Temodar dose may be increased to 200 mg/m2 daily for 5 days on, 23 days off, repeated every 28 days for subsequent cycles ?  ?Patient will take Temodar daily for 5 days on, 23 days off, and repeated. ? ?Temodar start date: 07/01/21 ?  ?Patient will take Zofran '8mg'$  tablet, 1 tablet by mouth 30-60 min prior to Temodar dose to help decrease N/V. ?  ?Adverse effects include but are not limited to: nausea, vomiting, anorexia, GI upset, and fatigue. Rare but serious adverse effects of pneumocystis pneumonia and secondary malignancy also discussed. ? ?We discussed strategies to manage constipation if they occur secondary to ondansetron dosing. ? ?PCP prophylaxis will not be initiated at this time, but may be added based on lymphocyte count in the future. ? ?Reviewed importance of keeping a medication schedule and plan for any missed doses. No barriers to medication adherence identified. ? ?Medication reconciliation performed and medication/allergy list updated. ? ?Insurance authorization for Temodar has been obtained. Patient is filling this through [facility] [employer] on [address].  ? ?All questions answered. ? ?Mr. And Ms. Schachter voiced understanding and appreciation.  ? ?Medication education handout and medication for first cycle sent in an e-mail to patient and patient's husband at their request. Patient knows to call the office with questions or concerns. Oral  Chemotherapy Clinic phone number provided to patient.  ? ?Leron Croak, PharmD, BCPS ?Hematology/Oncology Clinical Pharmacist ?[facility] ?[phone] ?06/28/2021 1:46 PM ? ? ? ?

## 2021-07-01 ENCOUNTER — Encounter: Payer: Self-pay | Admitting: Internal Medicine

## 2021-07-01 ENCOUNTER — Other Ambulatory Visit: Payer: Self-pay

## 2021-07-01 ENCOUNTER — Ambulatory Visit (INDEPENDENT_AMBULATORY_CARE_PROVIDER_SITE_OTHER): Payer: Medicare HMO | Admitting: Internal Medicine

## 2021-07-01 ENCOUNTER — Telehealth: Payer: Self-pay | Admitting: *Deleted

## 2021-07-01 VITALS — BP 130/80 | HR 74 | Ht 65.0 in | Wt 226.4 lb

## 2021-07-01 DIAGNOSIS — E890 Postprocedural hypothyroidism: Secondary | ICD-10-CM | POA: Diagnosis not present

## 2021-07-01 NOTE — Telephone Encounter (Signed)
Spoke with spouse [name].  Correction to previous note: patient is taking Decadron 4 mg once daily.  Per Dr Mickeal Skinner decrease to Decadron 2 mg once daily and see if this improves the symptoms. ? ? ?

## 2021-07-01 NOTE — Progress Notes (Signed)
Patient ID: Beverly SYPHER, female   DOB: May 10, 1945, 76 y.o.   MRN: [id number]   This visit occurred during the SARS-CoV-2 public health emergency.  Safety protocols were in place, including screening questions prior to the visit, additional usage of staff PPE, and extensive cleaning of exam room while observing appropriate contact time as indicated for disinfecting solutions.   HPI  Beverly Ballard is a 76 y.o.-year-old female, returning for follow-up for  uncontrolled post ablative hypothyroidism.  Last visit 9 months ago.  She is accompanied by her husband who offers part of the history especially related to her recent medical history, admissions, and medication plan. She saw Dr. Wilson Singer before, but came to see me after he retired.    Interim history: Since last visit, she developed seizures and was diagnosed with grade 4 glioblastoma of the parietal lobe.  She had craniotomy in 02/2021.  She was on temozolomide and now on Keppra, Zonisamide, dexamethasone for breakthrough seizures >> now 4 mg in am now >> will decrease to 2 mg daily - last dose this am. Unfortunately, a recent MRI from last months showed possible progression versus pseudoprogression. She is preparing to restart temozolomide tonight. She has problems with equilibrium and walks with a walker.  She also has nausea.  At today's visit, she feels jittery and they received a call during the visit to reduce her dexamethasone dose.  Reviewed history: Patient was diagnosed with hypothyroidism in the 80s after RAI treatment for Graves' disease.  She was initially started on Synthroid, now on levothyroxine.  She has been on high doses of levothyroxine, even 150 mcg for several years before she started to see Dr. Maudie Mercury, who has been trying to reduce her levothyroxine dose to improve her TFTs to the normal range.  However, since TFTs were fluctuating, she was referred to endocrinology.  She was prev. on 100 mcg levothyroxine daily.  After we optimized  how she was taking the medication (see below), her TSH became suppressed again >> dose decreased to 88 mcg daily. Subsequent TFTs were normal.  Patient continues on levothyroxine 88 mcg daily: - in am - fasting - at least 1h from b'fast - no Ca, MVI - on PPIs >4h after LT4 -+ Super B complex with 300 mcg Biotin On B12.  Reviewed her TFTs: Lab Results  Component Value Date   TSH 0.819 02/26/2021   TSH 1.63 10/10/2020   TSH 2.14 03/19/2020   TSH 2.23 10/12/2019   TSH 3.34 06/24/2019   TSH 1.46 12/30/2018   TSH 1.46 04/28/2018   TSH 1.04 03/15/2018   TSH 0.58 11/04/2017   TSH 0.09 (L) 09/16/2017   FREET4 0.77 10/10/2020   FREET4 1.0 03/19/2020   FREET4 0.95 10/12/2019   FREET4 0.91 04/28/2018   FREET4 0.77 11/04/2017   FREET4 0.89 09/16/2017   FREET4 0.77 04/16/2017   FREET4 0.87 02/10/2017    Pt denies: - feeling nodules in neck - hoarseness - dysphagia - choking - SOB with lying down  She has no FH of thyroid disorders. + FH of thyroid cancer (half sister by mother). No FH of thyroid cancer. No h/o radiation tx to head or neck except RAI treatment in the 80s.  No herbal supplements. + Biotin use. No recent steroids use.   Pt. also has a history of epilepsy ( last seizure 1988), HTN, HL, GERD, anxiety + depression - on Zoloft She also has a history of low abdominal pain that has been extensively investigated in  the past >> dx'ed with UC.  She is seeing Dr. Havery Moros.  Her mother lives in [place].  ROS: ENT:  + see HPI  I reviewed pt's medications, allergies, PMH, social hx, family hx, and changes were documented in the history of present illness. Otherwise, unchanged from my initial visit note.  Past Medical History:  Diagnosis Date   Allergy    Anemia    Anxiety and depression 12/19/2009   Arthritis    Cancer ([place])    vaginal   Degenerative disorder of eye    Diverticulosis    Dysrhythmia 01/2021   Atrial fib/flutter   Essential hypertension, benign  06/26/2009   GERD 05/27/2007   takes Nexium daily   History of gout    History of migraine many yrs ago   Hyperlipidemia    takes Pravastatin daily   Hypothyroidism    Insomnia    but doesn't take any meds   Osteoporosis    Seizures ([place])    last 1983, none since then- last seizure 1988 per pt    Sleep apnea    wears CPAP   Past Surgical History:  Procedure Laterality Date   ADENOIDECTOMY     APPLICATION OF CRANIAL NAVIGATION N/A 02/22/2021   Procedure: APPLICATION OF CRANIAL NAVIGATION;  Surgeon: Eustace Moore, MD;  Location: [facility];  Service: Neurosurgery;  Laterality: N/A;   BACK SURGERY     lumbar x2   BRONCHIAL BIOPSY  09/12/2019   Procedure: BRONCHIAL BIOPSIES;  Surgeon: Laurin Coder, MD;  Location: [facility];  Service: Endoscopy;;   BRONCHIAL WASHINGS  09/12/2019   Procedure: BRONCHIAL WASHINGS;  Surgeon: Laurin Coder, MD;  Location: [facility];  Service: Endoscopy;;   COLONOSCOPY  2009   CRANIOTOMY N/A 02/22/2021   Procedure: CRANIOTOMY FOR TUMOR EXCISION;  Surgeon: Eustace Moore, MD;  Location: [facility];  Service: Neurosurgery;  Laterality: N/A;   ESOPHAGOGASTRODUODENOSCOPY     Heel spurs Bilateral    HEMOSTASIS CONTROL  09/12/2019   Procedure: HEMOSTASIS CONTROL;  Surgeon: Laurin Coder, MD;  Location: [facility];  Service: Endoscopy;;  epi   KNEE ARTHROPLASTY Left 11/07/2013   Procedure: COMPUTER ASSISTED TOTAL KNEE ARTHROPLASTY;  Surgeon: Marybelle Killings, MD;  Location: [facility];  Service: Orthopedics;  Laterality: Left;  Left Total Knee Arthroplasty, Cemented, Computer Assist   TONSILLECTOMY     TOTAL KNEE REVISION Left 09/19/2014   Procedure: LEFT TOTAL KNEE REVISION;  Surgeon: Leandrew Koyanagi, MD;  Location: [facility];  Service: Orthopedics;  Laterality: Left;   UPPER GASTROINTESTINAL ENDOSCOPY     VIDEO BRONCHOSCOPY N/A 09/12/2019   Procedure: VIDEO BRONCHOSCOPY WITH FLUORO;  Surgeon: Laurin Coder, MD;  Location: [facility];  Service: Endoscopy;   Laterality: N/A;   Social History   Socioeconomic History   Marital status: Married    Spouse name: Not on file   Number of children: 1   Years of education: Not on file   Highest education level: 12th grade  Occupational History   Occupation: Retired  Tobacco Use   Smoking status: Former    Packs/day: 0.25    Years: 20.00    Pack years: 5.00    Types: Cigarettes    Quit date: 09/08/1978    Years since quitting: 42.8   Smokeless tobacco: Never   Tobacco comments:    quit smoking 63yr ago  Vaping Use   Vaping Use: Never used  Substance and Sexual Activity   Alcohol use: No  Drug use: No   Sexual activity: Not Currently    Birth control/protection: Post-menopausal  Other Topics Concern   Not on file  Social History Narrative   Right handed    Lives with husband    Social Determinants of Health   Financial Resource Strain: Low Risk    Difficulty of Paying Living Expenses: Not very hard  Food Insecurity: Unknown   Worried About Running Out of Food in the Last Year: Patient refused   Arboriculturist in the Last Year: Never true  Transportation Needs: No Transportation Needs   Lack of Transportation (Medical): No   Lack of Transportation (Non-Medical): No  Physical Activity: Unknown   Days of Exercise per Week: 1 day   Minutes of Exercise per Session: Patient refused  Stress: Stress Concern Present   Feeling of Stress : Very much  Social Connections: Moderately Isolated   Frequency of Communication with Friends and Family: Three times a week   Frequency of Social Gatherings with Friends and Family: Three times a week   Attends Religious Services: Never   Active Member of Clubs or Organizations: No   Attends Archivist Meetings: Never   Marital Status: Married  Human resources officer Violence: Not At Risk   Fear of Current or Ex-Partner: No   Emotionally Abused: No   Physically Abused: No   Sexually Abused: No   Current Outpatient Medications on File  Prior to Visit  Medication Sig Dispense Refill   albuterol (VENTOLIN HFA) 108 (90 Base) MCG/ACT inhaler Inhale 1-2 puffs into the lungs every 6 (six) hours as needed for wheezing or shortness of breath. 8 g 0   busPIRone (BUSPAR) 10 MG tablet TAKE 1 TABLET BY MOUTH TWICE A DAY 180 tablet 0   cholecalciferol (VITAMIN D3) 25 MCG (1000 UNIT) tablet Take 1,000 Units by mouth every morning.     dexamethasone (DECADRON) 4 MG tablet Take 1 tablet (4 mg total) by mouth 2 (two) times daily with a meal. 60 tablet 1   EPINEPHrine 0.3 mg/0.3 mL IJ SOAJ injection Inject 0.3 mg into the muscle once as needed for anaphylaxis. (Patient not taking: Reported on 06/25/2021)     esomeprazole (NEXIUM) 20 MG capsule Take 2 capsules (40 mg total) by mouth daily before breakfast. (Patient taking differently: Take 20 mg by mouth daily before breakfast.) 2 capsule 0   furosemide (LASIX) 20 MG tablet Daily prn leg swelling 90 tablet 1   gabapentin (NEURONTIN) 300 MG capsule Take 1 capsule every night 30 capsule 6   HYDROcodone-acetaminophen (NORCO) 5-325 MG tablet Take 1 tablet by mouth 3 (three) times daily as needed. (Patient not taking: Reported on 06/25/2021) 30 tablet 0   levETIRAcetam (KEPPRA) 1000 MG tablet Take 1 and 1/2 tablets twice a day 60 tablet 3   levothyroxine (SYNTHROID) 88 MCG tablet Take 1 tablet (88 mcg total) by mouth daily. (Patient taking differently: Take 88 mcg by mouth daily before breakfast.) 90 tablet 3   ondansetron (ZOFRAN) 8 MG tablet Take 1 tablet (8 mg total) by mouth 2 (two) times daily as needed (nausea and vomiting). May take 30-60 minutes prior to Temodar administration if nausea/vomiting occurs. 30 tablet 1   pravastatin (PRAVACHOL) 20 MG tablet Take 1 tablet (20 mg total) by mouth at bedtime. 90 tablet 1   prochlorperazine (COMPAZINE) 10 MG tablet Take 1 tablet (10 mg total) by mouth every 6 (six) hours as needed for nausea or vomiting. (Patient not taking: Reported on 06/25/2021)  30 tablet 3    sertraline (ZOLOFT) 100 MG tablet TAKE 2 TABLETS BY MOUTH EVERY DAY 180 tablet 1   solifenacin (VESICARE) 10 MG tablet Take 10 mg by mouth at bedtime.     Spacer/Aero-Holding Chambers (AEROCHAMBER PLUS) inhaler Use as instructed 1 each 0   temozolomide (TEMODAR) 140 MG capsule Take 1 capsule (140 mg total) by mouth daily. (Take with 180 mg capsule (total daily dose is 320 mg). May take on an empty stomach to decrease nausea & vomiting. Take for 5 days, then none for 23 days. Repeat every 28 days. 5 capsule 0   temozolomide (TEMODAR) 180 MG capsule Take 1 capsule (180 mg total) by mouth daily. (Take with 140 mg capsule (total daily dose is 320 mg). May take on an empty stomach to decrease nausea & vomiting. Take for 5 days, then none for 23 days. Repeat every 28 days. 5 capsule 0   vitamin B-12 (CYANOCOBALAMIN) 1000 MCG tablet Take 1,000 mcg by mouth every morning.     zonisamide (ZONEGRAN) 100 MG capsule Take 2 capsules twice a day 360 capsule 3   No current facility-administered medications on file prior to visit.   Allergies  Allergen Reactions   Lamotrigine Swelling    Tongue swelling   Bupropion Other (See Comments)    Unknown reaction   Carbamazepine Other (See Comments)    Hypnatremia   Levetiracetam Other (See Comments)    dizziness   Tramadol Other (See Comments)    Unknown reaction   Adhesive [Tape] Other (See Comments)    ?  Blister after knee surgery   Amitriptyline Nausea And Vomiting   Clarithromycin Other (See Comments)    Unknown reaction     Doxycycline Other (See Comments)    Interfered with seizure medication   Nickel Other (See Comments)    Had to remove knee implant with nickel and replace it   Other Other (See Comments)    Allergic to Metal and perfumes (unknown reaction)   Topamax [Topiramate] Nausea And Vomiting   Estradiol Rash   Latex Rash   Phenytoin Sodium Extended Other (See Comments)    drowsiness   Family History  Problem Relation Age of Onset    Aortic dissection Father    Arthritis Mother    Stroke Mother 53   Heart murmur Other    Throat cancer Sister    Colon cancer Neg Hx    Esophageal cancer Neg Hx    Stomach cancer Neg Hx    Rectal cancer Neg Hx    Colon polyps Neg Hx    PE: BP 130/80 (BP Location: Left Arm, Patient Position: Sitting, Cuff Size: Normal)    Pulse 74    Ht '5\' 5"'$  (1.651 m)    Wt 226 lb 6.4 oz (102.7 kg)    SpO2 96%    BMI 37.67 kg/m  Wt Readings from Last 3 Encounters:  07/01/21 226 lb 6.4 oz (102.7 kg)  06/25/21 226 lb 9.6 oz (102.8 kg)  06/11/21 227 lb (103 kg)   Constitutional: overweight, in NAD Eyes: PERRLA, EOMI, no exophthalmos ENT: moist mucous membranes, no thyromegaly, no cervical lymphadenopathy Cardiovascular: RRR, No MRG Respiratory:  CTA B Musculoskeletal: no deformities, strength intact in all 4 Skin: moist, warm, no rashes Neurological: + tremor with outstretched hands, DTR normal in all 4  ASSESSMENT: 1.  Post ablative hypothyroidism - After RAI treatment for Graves' disease in the 80s.  PLAN:  1. Patient with longstanding, previously uncontrolled, post  ablative hypothyroidism, on levothyroxine therapy.  She was on high-dose of levothyroxine in the past, with persistently suppressed TSH levels.  In the past she was taking levothyroxine at bedtime, but with mostly to the morning. -Reviewed latest TSH: Lab Results  Component Value Date   TSH 0.819 02/26/2021  - she continues on LT4 88 mcg daily - pt feels good on this dose.  She lost 7 pounds since last visit and we discussed that sometimes losing a significant amount of weight may affect the required dose of levothyroxine. - we discussed about taking the thyroid hormone every day, with water, >30 minutes before breakfast, separated by >4 hours from acid reflux medications, calcium, iron, multivitamins. Pt. is taking it correctly. - will check thyroid tests in 2 months, as we cannot check today since she just took dexamethasone.   I explained that steroids will impact TFTs and I do not absolutely feel it is necessary to recheck these for her now.  We can wait another 1.5 to 2 months and if she comes off dexamethasone, or if not, at that time, I advised her to come for labs before taking the dexamethasone dose in the morning.at that time, we will check TSH and fT4 -We will also plan to repeat TFTs in 6 months and have her back in a year  Orders Placed This Encounter  Procedures   T4, free   TSH   Philemon Kingdom, MD PhD [facility]

## 2021-07-01 NOTE — Patient Instructions (Addendum)
Please continue Levothyroxine 88 mcg daily in am. ? ?Take the thyroid hormone every day, with water, at least 30 minutes before breakfast, separated by at least 4 hours from: ?- acid reflux medications ?- calcium ?- iron ?- multivitamins ? ?Try to take Nexium >4h after levothyroxine. ? ?Stop the B complex.  ? ?Try to come back for labs in 2 months - if you are still on Dexamethasone, please have labs before taking it. ? ?Please come back for a follow-up appointment in 1 year but for labs at 6 months. ? ?

## 2021-07-01 NOTE — Telephone Encounter (Signed)
Patient spouse called to report increased insomina, inable to sit still, constantly up and down moving around, feels a little bit anxious more so than normal.   ? ?Denies any increased appetite.  Reports improved memory and ability to follow commands. ? ?She is currently taking Decadron 4 mg BID and was wanting to know if they should begin to taper.  Denies having any seizures since last in the office. ? ?Routed to MD ?

## 2021-07-08 ENCOUNTER — Other Ambulatory Visit: Payer: Self-pay

## 2021-07-08 ENCOUNTER — Ambulatory Visit (INDEPENDENT_AMBULATORY_CARE_PROVIDER_SITE_OTHER): Payer: Medicare HMO | Admitting: Physical Medicine and Rehabilitation

## 2021-07-08 ENCOUNTER — Ambulatory Visit: Payer: Self-pay

## 2021-07-08 VITALS — BP 133/76 | HR 76 | Temp 97.9°F

## 2021-07-08 DIAGNOSIS — M5412 Radiculopathy, cervical region: Secondary | ICD-10-CM | POA: Diagnosis not present

## 2021-07-08 MED ORDER — METHYLPREDNISOLONE ACETATE 80 MG/ML IJ SUSP
80.0000 mg | Freq: Once | INTRAMUSCULAR | Status: AC
  Administered 2021-07-08: 80 mg

## 2021-07-08 NOTE — Progress Notes (Signed)
Neck pain radiating down left arm. ?+driver ?-Denies blood thinners ?

## 2021-07-08 NOTE — Patient Instructions (Signed)

## 2021-07-09 NOTE — Progress Notes (Signed)
? ?Beverly Ballard - 76 y.o. female MRN [id number]  Date of birth: March 25, 1946 ? ?Office Visit Note: ?Visit Date: 07/08/2021 ?PCP: Caren Macadam, MD ?Referred by: Caren Macadam, MD ? ?Subjective: ?Chief Complaint  ?Patient presents with  ? Neck - Pain  ? ?HPI:  Beverly Ballard is a 76 y.o. female who comes in today for planned repeat Left C7-T1  Cervical Interlaminar epidural steroid injection with fluoroscopic guidance.  The patient has failed conservative care including home exercise, medications, time and activity modification.  This injection will be diagnostic and hopefully therapeutic.  Please see requesting physician notes for further details and justification. Patient received more than 50% pain relief from prior injection.  Unfortunately since I have seen the patient she was diagnosed with a high-grade glioma after having had a seizure.  She is status post resection.  She continues to have mostly left-sided neck pain into the trapezius area.  MRI from before did show foraminal stenosis at C5-6.  Consider referral to Dr. Laroy Apple at [facility] for C6 transforaminal injection. ? ?Referring: Dr. Eduard Roux  ? ?ROS Otherwise per HPI. ? ?Assessment & Plan: ?Visit Diagnoses:  ?  ICD-10-CM   ?1. Cervical radiculopathy  M54.12 XR C-ARM NO REPORT  ?  Epidural Steroid injection  ?  methylPREDNISolone acetate (DEPO-MEDROL) injection 80 mg  ?  ?  ?Plan: No additional findings.  ? ?Meds & Orders:  ?Meds ordered this encounter  ?Medications  ? methylPREDNISolone acetate (DEPO-MEDROL) injection 80 mg  ?  ?Orders Placed This Encounter  ?Procedures  ? XR C-ARM NO REPORT  ? Epidural Steroid injection  ?  ?Follow-up: Return if symptoms worsen or fail to improve.  ? ?Procedures: ?No procedures performed  ?Cervical Epidural Steroid Injection - Interlaminar Approach with Fluoroscopic Guidance ? ?Patient: Beverly Ballard      ?Date of Birth: March 19, 1946 ?MRN: [id number] ?PCP: Caren Macadam, MD      ?Visit Date:  07/08/2021 ?  ?Universal Protocol:    ?Date/Time: 03/21/238:06 AM ? ?Consent Given By: the patient ? ?Position: PRONE ? ?Additional Comments: ?Vital signs were monitored before and after the procedure. ?Patient was prepped and draped in the usual sterile fashion. ?The correct patient, procedure, and site was verified. ? ? ?Injection Procedure Details:  ? ?Procedure diagnoses: Cervical radiculopathy [M54.12]   ? ?Meds Administered:  ?Meds ordered this encounter  ?Medications  ? methylPREDNISolone acetate (DEPO-MEDROL) injection 80 mg  ?  ? ?Laterality: Left ? ?Location/Site: C7-T1 ? ?Needle: 3.5 in., 20 ga. Tuohy ? ?Needle Placement: Paramedian epidural space ? ?Findings: ? -Comments: Excellent flow of contrast into the epidural space. ? ?Procedure Details: ?Using a paramedian approach from the side mentioned above, the region overlying the inferior lamina was localized under fluoroscopic visualization and the soft tissues overlying this structure were infiltrated with 4 ml. of 1% Lidocaine without Epinephrine. A # 20 gauge, Tuohy needle was inserted into the epidural space using a paramedian approach. ? ?The epidural space was localized using loss of resistance along with contralateral oblique bi-planar fluoroscopic views.  After negative aspirate for air, blood, and CSF, a 2 ml. volume of Isovue-250 was injected into the epidural space and the flow of contrast was observed. Radiographs were obtained for documentation purposes.  ? ?The injectate was administered into the level noted above. ? ?Additional Comments:  ?The patient tolerated the procedure well ?Dressing: 2 x 2 sterile gauze and Band-Aid ?  ? ?Post-procedure details: ?Patient was observed during the  procedure. ?Post-procedure instructions were reviewed. ? ?Patient left the clinic in stable condition.   ? ?Clinical History: ?MRI CERVICAL SPINE WITHOUT CONTRAST  ?   ?TECHNIQUE:  ?Multiplanar, multisequence MR imaging of the cervical spine was  ?performed.  No intravenous contrast was administered.  ?   ?COMPARISON:  MRI of the cervical spine October 01, 2017.  ?   ?FINDINGS:  ?Alignment: Straightening of the cervical curvature.  ?   ?Vertebrae: No fracture, evidence of discitis, or bone lesion.  ?   ?Cord: Normal signal and morphology.  ?   ?Posterior Fossa, vertebral arteries, paraspinal tissues: Negative.  ?   ?Disc levels:  ?   ?C2-3: No spinal canal or neural foraminal stenosis.  ?   ?C3-4: Facet degenerative changes. No spinal canal or neural  ?foraminal stenosis.  ?   ?C4-5: Small posterior disc protrusion and mild facet degenerative  ?changes without significant spinal canal or neural foraminal  ?stenosis.  ?   ?C5-6: Mild loss of disc height, a posterior disc osteophyte complex  ?without significant spinal canal stenosis. Uncovertebral and facet  ?degenerative changes resulting in mild right and severe left neural  ?foraminal narrowing, unchanged from prior.  ?   ?C6-7: Small posterior disc osteophyte complex. No significant spinal  ?canal or neural foraminal stenosis.  ?   ?C7-T1: Mild facet degenerative changes. No spinal canal or neural  ?foraminal stenosis.  ?   ?IMPRESSION:  ?1. Mild degenerative changes of the cervical spine, more pronounced  ?at C5-6 where there is severe left neural foraminal narrowing. No  ?significant change from prior MRI.  ?2. No high-grade spinal canal stenosis at any level.  ?   ?   ?Electronically Signed  ?  By: Pedro Earls M.D.  ?  On: 11/16/2020 10:31  ? ? ? ?Objective:  VS:  HT:    WT:   BMI:     BP:133/76  HR:76bpm  TEMP:97.9 ?F (36.6 ?C)( )  RESP:  ?Physical Exam ?Vitals and nursing note reviewed.  ?Constitutional:   ?   General: She is not in acute distress. ?   Appearance: Normal appearance. She is not ill-appearing.  ?HENT:  ?   Head: Normocephalic and atraumatic.  ?   Right Ear: External ear normal.  ?   Left Ear: External ear normal.  ?Eyes:  ?   Extraocular Movements: Extraocular movements intact.   ?Cardiovascular:  ?   Rate and Rhythm: Normal rate.  ?   Pulses: Normal pulses.  ?Musculoskeletal:  ?   Cervical back: Tenderness present. No rigidity.  ?   Right lower leg: No edema.  ?   Left lower leg: No edema.  ?   Comments: Patient has good strength in the upper extremities including 5 out of 5 strength in wrist extension Ballard finger flexion and APB.  There is no atrophy of the hands intrinsically.  There is a negative Hoffmann's test. ?  ?Lymphadenopathy:  ?   Cervical: No cervical adenopathy.  ?Skin: ?   Findings: No erythema, lesion or rash.  ?Neurological:  ?   General: No focal deficit present.  ?   Mental Status: She is alert and oriented to person, place, and time.  ?   Sensory: No sensory deficit.  ?   Motor: No weakness or abnormal muscle tone.  ?   Coordination: Coordination normal.  ?Psychiatric:     ?   Mood and Affect: Mood normal.     ?   Behavior:  Behavior normal.  ?  ? ?Imaging: ?XR C-ARM NO REPORT ? ?Result Date: 07/08/2021 ?Please see Notes tab for imaging impression.  ?

## 2021-07-09 NOTE — Procedures (Signed)
Cervical Epidural Steroid Injection - Interlaminar Approach with Fluoroscopic Guidance ? ?Patient: Beverly Ballard      ?Date of Birth: 11-Sep-1945 ?MRN: [id number] ?PCP: Caren Macadam, MD      ?Visit Date: 07/08/2021 ?  ?Universal Protocol:    ?Date/Time: 03/21/238:06 AM ? ?Consent Given By: the patient ? ?Position: PRONE ? ?Additional Comments: ?Vital signs were monitored before and after the procedure. ?Patient was prepped and draped in the usual sterile fashion. ?The correct patient, procedure, and site was verified. ? ? ?Injection Procedure Details:  ? ?Procedure diagnoses: Cervical radiculopathy [M54.12]   ? ?Meds Administered:  ?Meds ordered this encounter  ?Medications  ? methylPREDNISolone acetate (DEPO-MEDROL) injection 80 mg  ?  ? ?Laterality: Left ? ?Location/Site: C7-T1 ? ?Needle: 3.5 in., 20 ga. Tuohy ? ?Needle Placement: Paramedian epidural space ? ?Findings: ? -Comments: Excellent flow of contrast into the epidural space. ? ?Procedure Details: ?Using a paramedian approach from the side mentioned above, the region overlying the inferior lamina was localized under fluoroscopic visualization and the soft tissues overlying this structure were infiltrated with 4 ml. of 1% Lidocaine without Epinephrine. A # 20 gauge, Tuohy needle was inserted into the epidural space using a paramedian approach. ? ?The epidural space was localized using loss of resistance along with contralateral oblique bi-planar fluoroscopic views.  After negative aspirate for air, blood, and CSF, a 2 ml. volume of Isovue-250 was injected into the epidural space and the flow of contrast was observed. Radiographs were obtained for documentation purposes.  ? ?The injectate was administered into the level noted above. ? ?Additional Comments:  ?The patient tolerated the procedure well ?Dressing: 2 x 2 sterile gauze and Band-Aid ?  ? ?Post-procedure details: ?Patient was observed during the procedure. ?Post-procedure instructions were  reviewed. ? ?Patient left the clinic in stable condition.  ?

## 2021-07-10 ENCOUNTER — Telehealth: Payer: Self-pay | Admitting: Pharmacist

## 2021-07-10 NOTE — Chronic Care Management (AMB) (Signed)
? ? ?  Chronic Care Management ?Pharmacy Assistant  ? ?Name: Beverly Ballard  MRN: [id number] DOB: 01/29/1946 ? ?Reason for Encounter: Reschedule initial appointment with patient to Sep 04, 2021 ?  ? ?Gennie Alma CMA  ?Clinical Pharmacist Assistant ?[phone] ? ?

## 2021-07-15 ENCOUNTER — Encounter: Payer: Self-pay | Admitting: Family Medicine

## 2021-07-15 ENCOUNTER — Ambulatory Visit (INDEPENDENT_AMBULATORY_CARE_PROVIDER_SITE_OTHER): Payer: Medicare HMO | Admitting: Family Medicine

## 2021-07-15 VITALS — BP 120/80 | HR 83 | Temp 97.9°F | Resp 16 | Ht 65.0 in | Wt 227.0 lb

## 2021-07-15 DIAGNOSIS — R7303 Prediabetes: Secondary | ICD-10-CM | POA: Diagnosis not present

## 2021-07-15 DIAGNOSIS — R300 Dysuria: Secondary | ICD-10-CM | POA: Diagnosis not present

## 2021-07-15 DIAGNOSIS — R350 Frequency of micturition: Secondary | ICD-10-CM | POA: Diagnosis not present

## 2021-07-15 LAB — POCT GLYCOSYLATED HEMOGLOBIN (HGB A1C): Hemoglobin A1C: 5.6 % (ref 4.0–5.6)

## 2021-07-15 LAB — POCT URINALYSIS DIPSTICK
Bilirubin, UA: NEGATIVE
Blood, UA: NEGATIVE
Glucose, UA: NEGATIVE
Ketones, UA: NEGATIVE
Leukocytes, UA: NEGATIVE
Nitrite, UA: NEGATIVE
Protein, UA: NEGATIVE
Spec Grav, UA: 1.015 (ref 1.010–1.025)
Urobilinogen, UA: 0.2 E.U./dL
pH, UA: 6 (ref 5.0–8.0)

## 2021-07-15 NOTE — Patient Instructions (Signed)
A few things to remember from today's visit: ? ?Frequent urination - Plan: POC Urinalysis Dipstick ? ?Dysuria - Plan: POC Urinalysis Dipstick ? ?Prediabetes - Plan: POC HgB A1c ? ?If you need refills please call your pharmacy. ?Do not use My Chart to request refills or for acute issues that need immediate attention. ?  ?Urine test in the office was not suggestive of urine tract infection. ?Continue adequate hydration. ?Monitor for new symptoms, if you start having burning sensation with urination please let us know. ?We will give further recommendations according to the urine culture. ? ?Please be sure medication list is accurate. ?If a new problem present, please set up appointment sooner than planned today. ? ? ? ? ? ? ? ?

## 2021-07-15 NOTE — Progress Notes (Signed)
? ? ?ACUTE VISIT ?Chief Complaint  ?Patient presents with  ? Urinary Frequency  ? ?HPI: ?[name] is a 76 y.o. female with a history of paroxysmal intrafibrillation, cervical radiculopathy,seizure disorder,high grade glioma , hypothyroidism, hyperlipidemia, hypertension, and anxiety here today complaining of 2 weeks of urinary frequency. ?Nocturia x 4. ?No dysuria but has had some "discomfort" " every now and then." ? ?Urinary Frequency  ?This is a new problem. The current episode started 1 to 4 weeks ago. The problem occurs intermittently. The problem has been unchanged. The pain is at a severity of 0/10. The patient is experiencing no pain. There has been no fever. She is Not sexually active. There is No history of pyelonephritis. Associated symptoms include frequency, nausea and urgency. Pertinent negatives include no chills, discharge, flank pain, hematuria, hesitancy, sweats or vomiting. She has tried nothing for the symptoms.  ?Mild nausea, which she attributes to chemotherapy. ?Negative for abdominal pain, changes in bowel habits, or vaginal bleeding. ? ?03/07/21 Ucx:  ?>=100,000 COLONIES/mL ENTEROCOCCUS FAECALIS  ?30,000 COLONIES/mL PSEUDOMONAS AERUGINOSA  ? ?No hx of diabetes. ?HgA1C has been slightly elevated in the past. ?She is currently on Dexamethasone. ?Negative for polyuria or polyphagia. ?She has been drinking more fluids lately. ? ?Lab Results  ?Component Value Date  ? HGBA1C 5.7 06/24/2019  ? ?High grade glioma, she is on Temozolomide. ?Lab Results  ?Component Value Date  ? CREATININE 1.02 (H) 05/21/2021  ? BUN 13 05/21/2021  ? NA 139 05/21/2021  ? K 4.3 05/21/2021  ? CL 102 05/21/2021  ? CO2 31 05/21/2021  ? ?Review of Systems  ?Constitutional:  Positive for fatigue. Negative for chills.  ?Respiratory:  Negative for cough, shortness of breath and wheezing.   ?Gastrointestinal:  Positive for nausea. Negative for vomiting.  ?Genitourinary:  Positive for frequency and urgency. Negative for  flank pain, hematuria and hesitancy.  ?Musculoskeletal:  Positive for gait problem.  ?Skin:  Negative for rash.  ?Rest see pertinent positives and negatives per HPI. ? ?Current Outpatient Medications on File Prior to Visit  ?Medication Sig Dispense Refill  ? albuterol (VENTOLIN HFA) 108 (90 Base) MCG/ACT inhaler Inhale 1-2 puffs into the lungs every 6 (six) hours as needed for wheezing or shortness of breath. 8 g 0  ? busPIRone (BUSPAR) 10 MG tablet TAKE 1 TABLET BY MOUTH TWICE A DAY 180 tablet 0  ? cholecalciferol (VITAMIN D3) 25 MCG (1000 UNIT) tablet Take 1,000 Units by mouth every morning.    ? dexamethasone (DECADRON) 4 MG tablet Take 1 tablet (4 mg total) by mouth 2 (two) times daily with a meal. 60 tablet 1  ? EPINEPHrine 0.3 mg/0.3 mL IJ SOAJ injection Inject 0.3 mg into the muscle once as needed for anaphylaxis.    ? esomeprazole (NEXIUM) 20 MG capsule Take 2 capsules (40 mg total) by mouth daily before breakfast. (Patient taking differently: Take 20 mg by mouth daily before breakfast.) 2 capsule 0  ? furosemide (LASIX) 20 MG tablet Daily prn leg swelling 90 tablet 1  ? gabapentin (NEURONTIN) 300 MG capsule Take 1 capsule every night 30 capsule 6  ? HYDROcodone-acetaminophen (NORCO) 5-325 MG tablet Take 1 tablet by mouth 3 (three) times daily as needed. 30 tablet 0  ? levETIRAcetam (KEPPRA) 1000 MG tablet Take 1 and 1/2 tablets twice a day 60 tablet 3  ? levothyroxine (SYNTHROID) 88 MCG tablet Take 1 tablet (88 mcg total) by mouth daily. (Patient taking differently: Take 88 mcg by mouth daily before breakfast.)  90 tablet 3  ? ondansetron (ZOFRAN) 8 MG tablet Take 1 tablet (8 mg total) by mouth 2 (two) times daily as needed (nausea and vomiting). May take 30-60 minutes prior to Temodar administration if nausea/vomiting occurs. 30 tablet 1  ? pravastatin (PRAVACHOL) 20 MG tablet Take 1 tablet (20 mg total) by mouth at bedtime. 90 tablet 1  ? prochlorperazine (COMPAZINE) 10 MG tablet Take 1 tablet (10 mg  total) by mouth every 6 (six) hours as needed for nausea or vomiting. 30 tablet 3  ? sertraline (ZOLOFT) 100 MG tablet TAKE 2 TABLETS BY MOUTH EVERY DAY 180 tablet 1  ? solifenacin (VESICARE) 10 MG tablet Take 10 mg by mouth at bedtime.    ? Spacer/Aero-Holding Chambers (AEROCHAMBER PLUS) inhaler Use as instructed 1 each 0  ? temozolomide (TEMODAR) 140 MG capsule Take 1 capsule (140 mg total) by mouth daily. (Take with 180 mg capsule (total daily dose is 320 mg). May take on an empty stomach to decrease nausea & vomiting. Take for 5 days, then none for 23 days. Repeat every 28 days. 5 capsule 0  ? temozolomide (TEMODAR) 180 MG capsule Take 1 capsule (180 mg total) by mouth daily. (Take with 140 mg capsule (total daily dose is 320 mg). May take on an empty stomach to decrease nausea & vomiting. Take for 5 days, then none for 23 days. Repeat every 28 days. 5 capsule 0  ? vitamin B-12 (CYANOCOBALAMIN) 1000 MCG tablet Take 1,000 mcg by mouth every morning.    ? zonisamide (ZONEGRAN) 100 MG capsule Take 2 capsules twice a day 360 capsule 3  ? ?No current facility-administered medications on file prior to visit.  ? ?Past Medical History:  ?Diagnosis Date  ? Allergy   ? Anemia   ? Anxiety and depression 12/19/2009  ? Arthritis   ? Cancer [facility])   ? vaginal  ? Degenerative disorder of eye   ? Diverticulosis   ? Dysrhythmia 01/2021  ? Atrial fib/flutter  ? Essential hypertension, benign 06/26/2009  ? GERD 05/27/2007  ? takes Nexium daily  ? History of gout   ? History of migraine many yrs ago  ? Hyperlipidemia   ? takes Pravastatin daily  ? Hypothyroidism   ? Insomnia   ? but doesn't take any meds  ? Osteoporosis   ? Seizures ([place])   ? last 1983, none since then- last seizure 1988 per pt   ? Sleep apnea   ? wears CPAP  ? ?Allergies  ?Allergen Reactions  ? Lamotrigine Swelling  ?  Tongue swelling  ? Bupropion Other (See Comments)  ?  Unknown reaction  ? Carbamazepine Other (See Comments)  ?  Hypnatremia  ? Levetiracetam Other  (See Comments)  ?  dizziness  ? Tramadol Other (See Comments)  ?  Unknown reaction  ? Adhesive [Tape] Other (See Comments)  ?  ?  Blister after knee surgery  ? Amitriptyline Nausea And Vomiting  ? Clarithromycin Other (See Comments)  ?  Unknown reaction ? ?  ? Doxycycline Other (See Comments)  ?  Interfered with seizure medication  ? Nickel Other (See Comments)  ?  Had to remove knee implant with nickel and replace it  ? Other Other (See Comments)  ?  Allergic to Metal and perfumes (unknown reaction)  ? Topamax [Topiramate] Nausea And Vomiting  ? Estradiol Rash  ? Latex Rash  ? Phenytoin Sodium Extended Other (See Comments)  ?  drowsiness  ? ? ?Social History  ? ?  Socioeconomic History  ? Marital status: Married  ?  Spouse name: Not on file  ? Number of children: 1  ? Years of education: Not on file  ? Highest education level: 12th grade  ?Occupational History  ? Occupation: Retired  ?Tobacco Use  ? Smoking status: Former  ?  Packs/day: 0.25  ?  Years: 20.00  ?  Pack years: 5.00  ?  Types: Cigarettes  ?  Quit date: 09/08/1978  ?  Years since quitting: 42.8  ? Smokeless tobacco: Never  ? Tobacco comments:  ?  quit smoking 33yr ago  ?Vaping Use  ? Vaping Use: Never used  ?Substance and Sexual Activity  ? Alcohol use: No  ? Drug use: No  ? Sexual activity: Not Currently  ?  Birth control/protection: Post-menopausal  ?Other Topics Concern  ? Not on file  ?Social History Narrative  ? Right handed   ? Lives with husband   ? ?Social Determinants of Health  ? ?Financial Resource Strain: Low Risk   ? Difficulty of Paying Living Expenses: Not very hard  ?Food Insecurity: Unknown  ? Worried About RCharity fundraiserin the Last Year: Patient refused  ? Ran Out of Food in the Last Year: Never true  ?Transportation Needs: No Transportation Needs  ? Lack of Transportation (Medical): No  ? Lack of Transportation (Non-Medical): No  ?Physical Activity: Unknown  ? Days of Exercise per Week: 1 day  ? Minutes of Exercise per Session:  Patient refused  ?Stress: Stress Concern Present  ? Feeling of Stress : Very much  ?Social Connections: Moderately Isolated  ? Frequency of Communication with Friends and Family: Three times a week  ? FGardiner Barefoot

## 2021-07-16 LAB — URINE CULTURE
MICRO NUMBER:: 13183644
SPECIMEN QUALITY:: ADEQUATE

## 2021-07-29 ENCOUNTER — Inpatient Hospital Stay: Payer: Medicare HMO

## 2021-07-29 ENCOUNTER — Telehealth: Payer: Self-pay | Admitting: *Deleted

## 2021-07-29 ENCOUNTER — Telehealth: Payer: Self-pay | Admitting: Internal Medicine

## 2021-07-29 ENCOUNTER — Inpatient Hospital Stay: Payer: Medicare HMO | Admitting: Internal Medicine

## 2021-07-29 NOTE — Telephone Encounter (Signed)
Patients spouse called to report that patient had seizure about an hour ago and is resting now.   Unable to come in for lab and md visit today.  He reports seizure was mild and waxed and waned before it eventually resolved.  Also reports she had one week ago Wednesday during her sleep. ? ?Confirmed patient is taking Decadron 2 mg once daily, Keppra 1500 mg BID, Zonisamide 200 mg BID. ? ?Reports increased urination but UTI was ruled out by PCP on 07/15/2021.   ? ?Reports patient is sleeping a lot more than normal. ? ?Spouse also communicated that patient told her daughter that she wasn't sure if she was going to continue her chemo because it made her feel so poorly.   ? ?Routed to provider.   ?

## 2021-07-29 NOTE — Telephone Encounter (Signed)
Per 4/10 inbasket.  Called pt and left message with appointment details.  Left call back number if changes are needed.  ?

## 2021-08-05 ENCOUNTER — Other Ambulatory Visit: Payer: Medicare HMO

## 2021-08-05 ENCOUNTER — Ambulatory Visit: Payer: Medicare HMO | Admitting: Internal Medicine

## 2021-08-06 ENCOUNTER — Inpatient Hospital Stay: Payer: Medicare HMO | Attending: Internal Medicine

## 2021-08-06 ENCOUNTER — Other Ambulatory Visit: Payer: Self-pay

## 2021-08-06 ENCOUNTER — Inpatient Hospital Stay: Payer: Medicare HMO | Admitting: Internal Medicine

## 2021-08-06 VITALS — BP 119/58 | HR 72 | Temp 97.7°F | Resp 18 | Ht 65.0 in | Wt 229.3 lb

## 2021-08-06 DIAGNOSIS — C719 Malignant neoplasm of brain, unspecified: Secondary | ICD-10-CM

## 2021-08-06 DIAGNOSIS — C713 Malignant neoplasm of parietal lobe: Secondary | ICD-10-CM | POA: Diagnosis not present

## 2021-08-06 DIAGNOSIS — Z87891 Personal history of nicotine dependence: Secondary | ICD-10-CM | POA: Insufficient documentation

## 2021-08-06 DIAGNOSIS — R112 Nausea with vomiting, unspecified: Secondary | ICD-10-CM | POA: Diagnosis not present

## 2021-08-06 DIAGNOSIS — R0602 Shortness of breath: Secondary | ICD-10-CM | POA: Insufficient documentation

## 2021-08-06 DIAGNOSIS — G40909 Epilepsy, unspecified, not intractable, without status epilepticus: Secondary | ICD-10-CM

## 2021-08-06 DIAGNOSIS — Z79899 Other long term (current) drug therapy: Secondary | ICD-10-CM | POA: Insufficient documentation

## 2021-08-06 LAB — CBC WITH DIFFERENTIAL (CANCER CENTER ONLY)
Abs Immature Granulocytes: 0.03 10*3/uL (ref 0.00–0.07)
Basophils Absolute: 0 10*3/uL (ref 0.0–0.1)
Basophils Relative: 0 %
Eosinophils Absolute: 0 10*3/uL (ref 0.0–0.5)
Eosinophils Relative: 0 %
HCT: 40 % (ref 36.0–46.0)
Hemoglobin: 13.3 g/dL (ref 12.0–15.0)
Immature Granulocytes: 0 %
Lymphocytes Relative: 16 %
Lymphs Abs: 1.2 10*3/uL (ref 0.7–4.0)
MCH: 32.7 pg (ref 26.0–34.0)
MCHC: 33.3 g/dL (ref 30.0–36.0)
MCV: 98.3 fL (ref 80.0–100.0)
Monocytes Absolute: 0.7 10*3/uL (ref 0.1–1.0)
Monocytes Relative: 9 %
Neutro Abs: 5.6 10*3/uL (ref 1.7–7.7)
Neutrophils Relative %: 75 %
Platelet Count: 182 10*3/uL (ref 150–400)
RBC: 4.07 MIL/uL (ref 3.87–5.11)
RDW: 13.4 % (ref 11.5–15.5)
WBC Count: 7.6 10*3/uL (ref 4.0–10.5)
nRBC: 0 % (ref 0.0–0.2)

## 2021-08-06 LAB — CMP (CANCER CENTER ONLY)
ALT: 17 U/L (ref 0–44)
AST: 16 U/L (ref 15–41)
Albumin: 3.8 g/dL (ref 3.5–5.0)
Alkaline Phosphatase: 45 U/L (ref 38–126)
Anion gap: 5 (ref 5–15)
BUN: 19 mg/dL (ref 8–23)
CO2: 29 mmol/L (ref 22–32)
Calcium: 8.8 mg/dL — ABNORMAL LOW (ref 8.9–10.3)
Chloride: 108 mmol/L (ref 98–111)
Creatinine: 0.86 mg/dL (ref 0.44–1.00)
GFR, Estimated: >60 mL/min
Glucose, Bld: 93 mg/dL (ref 70–99)
Potassium: 3.9 mmol/L (ref 3.5–5.1)
Sodium: 142 mmol/L (ref 135–145)
Total Bilirubin: 0.3 mg/dL (ref 0.3–1.2)
Total Protein: 6.7 g/dL (ref 6.5–8.1)

## 2021-08-06 NOTE — Progress Notes (Signed)
? ?[facility] [address]  ?[place], [place] [address] ?[phone] ? ? ?Interval Evaluation ? ?Date of Service: 08/06/21 ?Patient Name: Beverly Ballard ?Patient MRN: [id number] ?Patient DOB: [birth year] ?Provider: Ventura Sellers, MD ? ?Identifying Statement:  ?NADEA KIRKLAND is a 76 y.o. female with left parietal  high grade glioma, IDH-1 wild type   ? ?Oncologic History: ?Oncology History  ?High grade glioma not classifiable by WHO criteria ([place])  ?02/17/2021 Initial Diagnosis  ? High grade glioma not classifiable by WHO criteria ([place]) ? ?  ?02/22/2021 Cancer Staging  ? Staging form: Brain and Spinal Cord, AJCC 8th Edition ?- Pathologic stage from 02/22/2021: WHO Grade IV - Signed by Ventura Sellers, MD on 03/18/2021 ?Stage prefix: Initial diagnosis ?Histologic grading system: 4 grade system ?Extent of surgical resection: Subtotal resection ?Solitary (s) or multifocal (m) tumors in the primary site: Solitary ?Tumor location in brain: Eloquent brain area ?Karnofsky performance status: Score 80 ?Seizures at presentation: Present ?Duration of symptoms before diagnosis: Short ?IDH1 mutation: Negative ? ?  ?04/08/2021 -  Radiation Therapy  ? IMRT and concurrent Temodar with Dr. Isidore Moos ?  ?06/30/2021 -  Chemotherapy  ? Patient is on Treatment Plan : BRAIN GLIOBLASTOMA Consolidation Temozolomide Days 1-5 q28 Days   ? ?  ?  ? ? ?Biomarkers: ? ?MGMT Unknown.  ?IDH 1/2 Wild type.  ?EGFR Unknown  ?TERT Unknown  ? ?Interval History: ?JESSELLE LAFLAMME presents for follow up today, now having completed cycle #1 of 5-day Temodar.  She does describe 3 seizure episodes over the past 6 weeks, since prior visit.  Still on Keppra 1563m BID, Zonisamide 2099mBID.  Continues to experience anxiety, frustration at her situation.  Continues to complain of headaches, occurring once per week or so.  Getting around the house with a walker due to balance issues, dizziness. ? ?H+P (03/18/21) Patient presented  to medical attention at the end of October 2022 with new type of seizure event, described as "shaking all over, lost consciousness".  She carries history of epilepsy going back to childhood, had been stable on Zonisamide for several years.  CNS imgaging demonstrated L parietal mass.  She underwent craniotomy and debulking resection with Dr. JoRonnald Rampn 02/22/21; path demonstrated high grade glioma, IDH-1 wt.  Since surgery she has been rehospitalized for further breakthrough seizures, now on both Keppra and Zonegran.  She was also recently treated for recurrent pneumonia with oral antibiotics.  At present, she feels very impaired with regards to cognition, comprehension of language, and balance.  She is ambulating with a walker or wheelchair, also limited by long standing orthopedic issues.  She has recurrent nausea and shortness of breath which are both chronic issues.  Lives at home with her husband who also has cancer. ? ?Medications: ?Current Outpatient Medications on File Prior to Visit  ?Medication Sig Dispense Refill  ? albuterol (VENTOLIN HFA) 108 (90 Base) MCG/ACT inhaler Inhale 1-2 puffs into the lungs every 6 (six) hours as needed for wheezing or shortness of breath. 8 g 0  ? busPIRone (BUSPAR) 10 MG tablet TAKE 1 TABLET BY MOUTH TWICE A DAY 180 tablet 0  ? cholecalciferol (VITAMIN D3) 25 MCG (1000 UNIT) tablet Take 1,000 Units by mouth every morning.    ? dexamethasone (DECADRON) 4 MG tablet Take 1 tablet (4 mg total) by mouth 2 (two) times daily with a meal. 60 tablet 1  ? EPINEPHrine 0.3 mg/0.3 mL IJ SOAJ injection Inject  0.3 mg into the muscle once as needed for anaphylaxis.    ? esomeprazole (NEXIUM) 20 MG capsule Take 2 capsules (40 mg total) by mouth daily before breakfast. (Patient taking differently: Take 20 mg by mouth daily before breakfast.) 2 capsule 0  ? gabapentin (NEURONTIN) 300 MG capsule Take 1 capsule every night 30 capsule 6  ? HYDROcodone-acetaminophen (NORCO) 5-325 MG tablet Take 1  tablet by mouth 3 (three) times daily as needed. 30 tablet 0  ? levETIRAcetam (KEPPRA) 1000 MG tablet Take 1 and 1/2 tablets twice a day 60 tablet 3  ? levothyroxine (SYNTHROID) 88 MCG tablet Take 1 tablet (88 mcg total) by mouth daily. (Patient taking differently: Take 88 mcg by mouth daily before breakfast.) 90 tablet 3  ? ondansetron (ZOFRAN) 8 MG tablet Take 1 tablet (8 mg total) by mouth 2 (two) times daily as needed (nausea and vomiting). May take 30-60 minutes prior to Temodar administration if nausea/vomiting occurs. 30 tablet 1  ? pravastatin (PRAVACHOL) 20 MG tablet Take 1 tablet (20 mg total) by mouth at bedtime. 90 tablet 1  ? prochlorperazine (COMPAZINE) 10 MG tablet Take 1 tablet (10 mg total) by mouth every 6 (six) hours as needed for nausea or vomiting. 30 tablet 3  ? sertraline (ZOLOFT) 100 MG tablet TAKE 2 TABLETS BY MOUTH EVERY DAY 180 tablet 1  ? solifenacin (VESICARE) 10 MG tablet Take 10 mg by mouth at bedtime.    ? Spacer/Aero-Holding Chambers (AEROCHAMBER PLUS) inhaler Use as instructed 1 each 0  ? vitamin B-12 (CYANOCOBALAMIN) 1000 MCG tablet Take 1,000 mcg by mouth every morning.    ? zonisamide (ZONEGRAN) 100 MG capsule Take 2 capsules twice a day 360 capsule 3  ? furosemide (LASIX) 20 MG tablet Daily prn leg swelling (Patient not taking: Reported on 08/06/2021) 90 tablet 1  ? temozolomide (TEMODAR) 140 MG capsule Take 1 capsule (140 mg total) by mouth daily. (Take with 180 mg capsule (total daily dose is 320 mg). May take on an empty stomach to decrease nausea & vomiting. Take for 5 days, then none for 23 days. Repeat every 28 days. (Patient not taking: Reported on 08/06/2021) 5 capsule 0  ? temozolomide (TEMODAR) 180 MG capsule Take 1 capsule (180 mg total) by mouth daily. (Take with 140 mg capsule (total daily dose is 320 mg). May take on an empty stomach to decrease nausea & vomiting. Take for 5 days, then none for 23 days. Repeat every 28 days. (Patient not taking: Reported on  08/06/2021) 5 capsule 0  ? ?No current facility-administered medications on file prior to visit.  ? ? ?Allergies:  ?Allergies  ?Allergen Reactions  ? Lamotrigine Swelling  ?  Tongue swelling  ? Bupropion Other (See Comments)  ?  Unknown reaction  ? Carbamazepine Other (See Comments)  ?  Hypnatremia  ? Levetiracetam Other (See Comments)  ?  dizziness  ? Tramadol Other (See Comments)  ?  Unknown reaction  ? Adhesive [Tape] Other (See Comments)  ?  ?  Blister after knee surgery  ? Amitriptyline Nausea And Vomiting  ? Clarithromycin Other (See Comments)  ?  Unknown reaction ? ?  ? Doxycycline Other (See Comments)  ?  Interfered with seizure medication  ? Nickel Other (See Comments)  ?  Had to remove knee implant with nickel and replace it  ? Other Other (See Comments)  ?  Allergic to Metal and perfumes (unknown reaction)  ? Topamax [Topiramate] Nausea And Vomiting  ? Estradiol Rash  ?  Latex Rash  ? Phenytoin Sodium Extended Other (See Comments)  ?  drowsiness  ? ?Past Medical History:  ?Past Medical History:  ?Diagnosis Date  ? Allergy   ? Anemia   ? Anxiety and depression 12/19/2009  ? Arthritis   ? Cancer [facility])   ? vaginal  ? Degenerative disorder of eye   ? Diverticulosis   ? Dysrhythmia 01/2021  ? Atrial fib/flutter  ? Essential hypertension, benign 06/26/2009  ? GERD 05/27/2007  ? takes Nexium daily  ? History of gout   ? History of migraine many yrs ago  ? Hyperlipidemia   ? takes Pravastatin daily  ? Hypothyroidism   ? Insomnia   ? but doesn't take any meds  ? Osteoporosis   ? Seizures ([place])   ? last 1983, none since then- last seizure 1988 per pt   ? Sleep apnea   ? wears CPAP  ? ?Past Surgical History:  ?Past Surgical History:  ?Procedure Laterality Date  ? ADENOIDECTOMY    ? APPLICATION OF CRANIAL NAVIGATION N/A 02/22/2021  ? Procedure: APPLICATION OF CRANIAL NAVIGATION;  Surgeon: Eustace Moore, MD;  Location: [facility];  Service: Neurosurgery;  Laterality: N/A;  ? BACK SURGERY    ? lumbar x2  ? BRONCHIAL BIOPSY   09/12/2019  ? Procedure: BRONCHIAL BIOPSIES;  Surgeon: Laurin Coder, MD;  Location: [facility];  Service: Endoscopy;;  ? BRONCHIAL WASHINGS  09/12/2019  ? Procedure: BRONCHIAL WASHINGS;  Surgeon: Alethia Berthold

## 2021-08-07 ENCOUNTER — Telehealth: Payer: Self-pay

## 2021-08-07 ENCOUNTER — Encounter: Payer: Self-pay | Admitting: Internal Medicine

## 2021-08-07 NOTE — Telephone Encounter (Signed)
Phone call from patient's husband stating that they have decided to move forward with  ?cycle #2  of Temozolomide. ? ?Routed to provider. ?

## 2021-08-07 NOTE — Telephone Encounter (Signed)
Pt's husband called to say that Beverly Ballard has changed her mind and has decided to not continue with chemotherapy. ?

## 2021-08-08 ENCOUNTER — Telehealth: Payer: Self-pay | Admitting: *Deleted

## 2021-08-08 ENCOUNTER — Other Ambulatory Visit: Payer: Self-pay | Admitting: *Deleted

## 2021-08-08 MED ORDER — LEVETIRACETAM 1000 MG PO TABS: ORAL_TABLET | ORAL | 3 refills | Status: DC

## 2021-08-08 NOTE — Telephone Encounter (Signed)
Spoke with patient and she confirmed that she does not want to continue chemotherapy and is agreeable to Hospice.  Notified provider. ?

## 2021-08-12 ENCOUNTER — Telehealth: Payer: Self-pay | Admitting: Family Medicine

## 2021-08-12 NOTE — Telephone Encounter (Signed)
Beverly Ballard from [facility] called because Patient is having trouble with overactive bladder, wanted to know if she could be started back on her solifenacin (VESICARE) 10 MG tablet ? ? ? ? ? ?Good callback number [phone] ? ? ? ? ?Please advise   ? ?

## 2021-08-12 NOTE — Telephone Encounter (Signed)
Spoke with Janett Billow and informed her of the message below.  She requested refills be sent to the patient's pharmacy.  Message sent to PCP as Rx is listed as historical med. ?

## 2021-08-12 NOTE — Telephone Encounter (Signed)
It was never documented that I can see that she wasn't taking the vesicare? It has been on med list and is still active. Ok to resume if she had stopped. Last urine culture was negative for infection last month.  ?

## 2021-08-14 ENCOUNTER — Other Ambulatory Visit: Payer: Self-pay | Admitting: Family Medicine

## 2021-08-14 MED ORDER — SOLIFENACIN SUCCINATE 10 MG PO TABS: 10.0000 mg | ORAL_TABLET | Freq: Every day | ORAL | 5 refills | Status: DC

## 2021-08-14 NOTE — Telephone Encounter (Signed)
Rx sent to the pharmacy for patient. ?

## 2021-08-18 ENCOUNTER — Other Ambulatory Visit: Payer: Self-pay | Admitting: Physician Assistant

## 2021-08-27 ENCOUNTER — Ambulatory Visit: Payer: Medicare HMO | Admitting: Neurology

## 2021-08-29 ENCOUNTER — Telehealth: Payer: Self-pay | Admitting: *Deleted

## 2021-08-29 NOTE — Telephone Encounter (Signed)
Patients spouse called to report that patient had seizure Sunday and again today and wasn't sure if he should reach out directly to [place] or to hospice.   ? ?Attempted to call patient back to review medications and make sure nothing had been changed since transfer to hospice but had to leave a voice message.  Pending call back.   Also, left on voice message that hospice could help as well. ?

## 2021-08-30 ENCOUNTER — Telehealth: Payer: Self-pay | Admitting: Pharmacist

## 2021-08-30 NOTE — Chronic Care Management (AMB) (Signed)
Chronic Care Management Pharmacy Assistant   Name: Beverly Ballard  MRN: [id number] DOB: 04/21/1946  Beverly Ballard is an 76 y.o. year old female who presents for her initial CCM visit with the clinical pharmacist.  Reason for Encounter: Chart prep for initial visit with Beverly Ballard Clinical Pharmacist on 09/04/2021 at 11:00 over the phone.    Conditions to be addressed/monitored: HTN, HLD, Depression, GERD, and Osteopenia, Seizures, high grade glioma  Recent office visits:  07/15/2021 Beverly Martinique MD - Patient was seen for frequent urination and an additional issue. No medication changes.  Return if symptoms worsen or fail to improve.  05/29/2021 Beverly Rough MD - Patient was seen for essential hypertension and additional issues. Changed Furosemide 20 mg daily as needed. Discontinued. Benzonatate, Breztri, Ferous Sulfate, Hycodan, Levocetirizine and Lisinopril. No follow up noted.   Recent consult visits:  08/06/2021 Beverly Cobbs MD (oncology) - Patient was seen for High grade glioma not classifiable by [facility] criteria and additional issue. No medication changes. No follow up noted.   07/08/2021 Beverly Sinning MD (ortho care) - Patient was seen for cervical radiculopathy. No medication changes.  Return if symptoms worsen or fail to improve.  07/01/2021 Beverly Kingdom MD (endocrinology) - Patient was seen for Postablative hypothyroidism. Discontinue B complex. Follow up in 1 year.   06/25/2021 Beverly Cobbs MD (oncology) - Patient was seen for  High grade glioma not classifiable by [facility] criteria and additional issue. Changed Ondansetron to 8 mg 1 tablet (8 mg total) by mouth 2 (two) times daily as needed and Temozolomide 180 mg 1 capsule (180 mg total) by mouth dailyMay take on an empty stomach to decrease nausea & vomiting . Follow up in 1 month.  06/18/2021 Beverly Cobbs MD (oncology) - Patient was seen for  High grade glioma not classifiable by [facility] criteria and additional  issue. Started Dexamethasone 4 mg twice daily. Changed Levetiracetam 1000 mg 1 1/2 tablets twice daily. Follow up in 1 week.   06/11/2021 Beverly Shown MD (orthopedic) - Patient was seen for Radiculopathy of cervical spine. No medication changes.  Return if symptoms worsen or fail to improve.  05/31/2021 Beverly Newer MD (neurology) - Patient was seen for Generalized idiopathic epilepsy and epileptic syndromes, not intractable, without status epilepticus and additional issues. Started Gabapentin 300 mg 1 capsule every night. Changed Levetiracetam 500 mg 1 1/2 tablets twice daily. Return in about 3 months.  05/21/2021 Beverly Cobbs MD (oncology) - Patient was seen for  High grade glioma not classifiable by [facility] criteria and additional issue. No medication changes. Follow up in 4 weeks.   05/13/2021 Beverly Cobbs MD (oncology) - Patient was seen for Patient was seen for  High grade glioma not classifiable by [facility] criteria and additional issue. Started prochlorperazine 10 mg every 6 hrs prn. Discontinued dexamethasone. No follow up noted.   04/29/2021 Vaslow MD (oncology) - Patient was seen for Patient was seen for  High grade glioma not classifiable by WHO criteria and additional issue.No medication changes. No follow up noted.   04/25/2021 Beverly Breeding MD (cardiology) - Patient was seen for essential hypertension. No medication changes. Follow up as needed.   04/08/2022 Beverly Gibson MD (cancer center) - Patient was seen for oncology treatments. No other chart notes.   Hospital visits:  None  Medications: Outpatient Encounter Medications as of 08/30/2021  Medication Sig Note   albuterol (VENTOLIN HFA) 108 (90 Base) MCG/ACT inhaler Inhale 1-2 puffs into the lungs every 6 (six) hours  as needed for wheezing or shortness of breath. 03/07/2021: prn   busPIRone (BUSPAR) 10 MG tablet TAKE 1 TABLET BY MOUTH TWICE A DAY    cholecalciferol (VITAMIN D3) 25 MCG (1000 UNIT) tablet Take 1,000 Units by mouth  every morning.    dexamethasone (DECADRON) 4 MG tablet Take 1 tablet (4 mg total) by mouth 2 (two) times daily with a meal. 08/06/2021: Reports taking Decadron 2 mg once daily. Smith,Kimberly N, RN 08/06/21 12:25 PM       EPINEPHrine 0.3 mg/0.3 mL IJ SOAJ injection Inject 0.3 mg into the muscle once as needed for anaphylaxis. 03/07/2021: prn   esomeprazole (NEXIUM) 20 MG capsule Take 2 capsules (40 mg total) by mouth daily before breakfast. (Patient taking differently: Take 20 mg by mouth daily before breakfast.) 02/18/2021: OTC   furosemide (LASIX) 20 MG tablet Daily prn leg swelling (Patient not taking: Reported on 08/06/2021)    gabapentin (NEURONTIN) 300 MG capsule Take 1 capsule every night    HYDROcodone-acetaminophen (NORCO) 5-325 MG tablet Take 1 tablet by mouth 3 (three) times daily as needed.    levETIRAcetam (KEPPRA) 1000 MG tablet Take 1 and 1/2 tablets twice a day    levothyroxine (SYNTHROID) 88 MCG tablet Take 1 tablet (88 mcg total) by mouth daily. (Patient taking differently: Take 88 mcg by mouth daily before breakfast.)    ondansetron (ZOFRAN) 8 MG tablet Take 1 tablet (8 mg total) by mouth 2 (two) times daily as needed (nausea and vomiting). May take 30-60 minutes prior to Temodar administration if nausea/vomiting occurs.    pravastatin (PRAVACHOL) 20 MG tablet Take 1 tablet (20 mg total) by mouth at bedtime.    prochlorperazine (COMPAZINE) 10 MG tablet Take 1 tablet (10 mg total) by mouth every 6 (six) hours as needed for nausea or vomiting.    sertraline (ZOLOFT) 100 MG tablet TAKE 2 TABLETS BY MOUTH EVERY DAY    solifenacin (VESICARE) 10 MG tablet Take 1 tablet (10 mg total) by mouth at bedtime.    Spacer/Aero-Holding Chambers (AEROCHAMBER PLUS) inhaler Use as instructed    temozolomide (TEMODAR) 140 MG capsule Take 1 capsule (140 mg total) by mouth daily. (Take with 180 mg capsule (total daily dose is 320 mg). May take on an empty stomach to decrease nausea & vomiting. Take  for 5 days, then none for 23 days. Repeat every 28 days. (Patient not taking: Reported on 08/06/2021)    temozolomide (TEMODAR) 180 MG capsule Take 1 capsule (180 mg total) by mouth daily. (Take with 140 mg capsule (total daily dose is 320 mg). May take on an empty stomach to decrease nausea & vomiting. Take for 5 days, then none for 23 days. Repeat every 28 days. (Patient not taking: Reported on 08/06/2021)    vitamin B-12 (CYANOCOBALAMIN) 1000 MCG tablet Take 1,000 mcg by mouth every morning.    zonisamide (ZONEGRAN) 100 MG capsule Take 2 capsules twice a day    No facility-administered encounter medications on file as of 08/30/2021.  Fill History: DEXAMETHASONE 4 MG TABLET 04/23/2021 60   FUROSEMIDE '20MG'$  TAB 06/03/2021 90   GABAPENTIN 300 MG CAPSULE 05/31/2021 30   LEVOCETIRIZINE '5MG'$  TABLETS 05/28/2021 90   LEVOTHYROXINE 88 MCG TABLET 06/29/2021 90   LISINOPRIL '5MG'$  TAB 07/28/2021 90   ONDANSETRON '8MG'$  TAB 06/26/2021 15   PRAVASTATIN '20MG'$  TAB 07/28/2021 90   SERTRALINE HCL 100 MG TABLET 06/29/2021 90   SOLIFENACIN 10 MG TABLET 08/14/2021 30   ZONISAMIDE 100 MG CAPSULE 05/31/2021 90  BUSPIRONE HCL 10 MG TABLET 05/17/2021 90   Have you seen any other providers since your last visit? [facility] under [facility].   Any changes in your medications or health? Patient states [facility]osp[facility] has removed many medications.   Any side effects from any medications? Patient states she doesn't feel she is having any side effects.   Do you have an symptoms or problems not managed by your medications? Patient states current medications help with the symptoms they are used for.   Any concerns about your health right now? Patient denies.   Has your provider asked that you check blood pressure, blood sugar, or follow special diet at home? Patient denies, she states [facility] checks her blood pressure when then come to visit.   Do you get any type of exercise on a regular basis? Patient is  unable to exercise.   Do you have any problems getting your medications? Patient states her thyroid medication costs $60 monthly.   Is there anything that you would like to discuss during the appointment? Patient denies.   Patient advised to have medications and supplements ready for the appointment   Care Gaps: AWV - scheduled 02/04/2022 Last BP - 119/58 on 08/06/2021 Shingrix - never done Covid vaccine - overdue   Star Rating Drugs: Lisinopril 5 mg - last filled 104/12/2021 90 DS at [facility] Pravastatin 20 mg - last filled 07/28/2021 90 DS at [place] Pharmacist Assistant [phone]

## 2021-08-30 NOTE — Chronic Care Management (AMB) (Signed)
Left message on patients home phone of cancellation of appointment on 09/04/21.

## 2021-09-02 ENCOUNTER — Other Ambulatory Visit: Payer: Self-pay | Admitting: Physician Assistant

## 2021-09-04 ENCOUNTER — Telehealth: Payer: Medicare HMO

## 2021-09-05 ENCOUNTER — Telehealth: Payer: Self-pay | Admitting: *Deleted

## 2021-09-05 NOTE — Telephone Encounter (Signed)
Received call from [facility] with [facility] [phone].  She called to report that patient had been having increased seizures documented on 08/18/2021, 08/27/2021 and on 08/29/2021 x 2.  She reports that they instructed the patient to take her Ativan as instructed for breakthrough seizure management and that has helped and no new seizures have been witnessed.  She reports that the patient is complaining of dizziness with moving and headaches that come and go.  She wasn't sure if it was a side effect of her medication but wanted to know if anything could be prescribed for the headache.  Routing to provider.

## 2021-09-11 ENCOUNTER — Telehealth: Payer: Self-pay

## 2021-09-11 NOTE — Telephone Encounter (Signed)
[facility] called to say that patient had a fall at home this week with no injury.  In addition, patient has started taking her ativan and her seizure activity is decreasing.

## 2021-10-02 ENCOUNTER — Other Ambulatory Visit: Payer: Self-pay

## 2021-10-02 ENCOUNTER — Emergency Department (HOSPITAL_COMMUNITY)

## 2021-10-02 ENCOUNTER — Emergency Department (HOSPITAL_COMMUNITY)
Admission: EM | Admit: 2021-10-02 | Discharge: 2021-10-02 | Disposition: A | Discharge status: Home / Self Care | Attending: Emergency Medicine | Admitting: Emergency Medicine

## 2021-10-02 DIAGNOSIS — W01198A Fall on same level from slipping, tripping and stumbling with subsequent striking against other object, initial encounter: Secondary | ICD-10-CM | POA: Insufficient documentation

## 2021-10-02 DIAGNOSIS — Z91040 Latex allergy status: Secondary | ICD-10-CM | POA: Diagnosis not present

## 2021-10-02 DIAGNOSIS — M25572 Pain in left ankle and joints of left foot: Secondary | ICD-10-CM | POA: Insufficient documentation

## 2021-10-02 DIAGNOSIS — M25569 Pain in unspecified knee: Secondary | ICD-10-CM | POA: Diagnosis present

## 2021-10-02 DIAGNOSIS — I10 Essential (primary) hypertension: Secondary | ICD-10-CM | POA: Diagnosis not present

## 2021-10-02 DIAGNOSIS — Z9889 Other specified postprocedural states: Secondary | ICD-10-CM | POA: Diagnosis not present

## 2021-10-02 DIAGNOSIS — M25562 Pain in left knee: Secondary | ICD-10-CM | POA: Insufficient documentation

## 2021-10-02 DIAGNOSIS — G8911 Acute pain due to trauma: Secondary | ICD-10-CM | POA: Diagnosis not present

## 2021-10-02 DIAGNOSIS — M25561 Pain in right knee: Secondary | ICD-10-CM | POA: Insufficient documentation

## 2021-10-02 DIAGNOSIS — S0990XA Unspecified injury of head, initial encounter: Secondary | ICD-10-CM | POA: Diagnosis not present

## 2021-10-02 DIAGNOSIS — Z471 Aftercare following joint replacement surgery: Secondary | ICD-10-CM | POA: Diagnosis not present

## 2021-10-02 DIAGNOSIS — R296 Repeated falls: Secondary | ICD-10-CM | POA: Diagnosis not present

## 2021-10-02 DIAGNOSIS — M25571 Pain in right ankle and joints of right foot: Secondary | ICD-10-CM | POA: Insufficient documentation

## 2021-10-02 DIAGNOSIS — M25551 Pain in right hip: Secondary | ICD-10-CM | POA: Diagnosis not present

## 2021-10-02 DIAGNOSIS — S8012XA Contusion of left lower leg, initial encounter: Secondary | ICD-10-CM | POA: Diagnosis not present

## 2021-10-02 DIAGNOSIS — R609 Edema, unspecified: Secondary | ICD-10-CM | POA: Diagnosis not present

## 2021-10-02 DIAGNOSIS — Y92002 Bathroom of unspecified non-institutional (private) residence single-family (private) house as the place of occurrence of the external cause: Secondary | ICD-10-CM | POA: Diagnosis not present

## 2021-10-02 DIAGNOSIS — Z96652 Presence of left artificial knee joint: Secondary | ICD-10-CM | POA: Diagnosis not present

## 2021-10-02 DIAGNOSIS — R42 Dizziness and giddiness: Secondary | ICD-10-CM | POA: Diagnosis not present

## 2021-10-02 DIAGNOSIS — Z743 Need for continuous supervision: Secondary | ICD-10-CM | POA: Diagnosis not present

## 2021-10-02 DIAGNOSIS — M25552 Pain in left hip: Secondary | ICD-10-CM | POA: Diagnosis not present

## 2021-10-02 LAB — URINALYSIS, ROUTINE W REFLEX MICROSCOPIC
Bilirubin Urine: NEGATIVE
Glucose, UA: NEGATIVE mg/dL
Hgb urine dipstick: NEGATIVE
Ketones, ur: NEGATIVE mg/dL
Leukocytes,Ua: NEGATIVE
Nitrite: NEGATIVE
Protein, ur: NEGATIVE mg/dL
Specific Gravity, Urine: 1.006 (ref 1.005–1.030)
pH: 7 (ref 5.0–8.0)

## 2021-10-02 LAB — CBC WITH DIFFERENTIAL/PLATELET
Abs Immature Granulocytes: 0.14 10*3/uL — ABNORMAL HIGH (ref 0.00–0.07)
Basophils Absolute: 0 10*3/uL (ref 0.0–0.1)
Basophils Relative: 0 %
Eosinophils Absolute: 0.1 10*3/uL (ref 0.0–0.5)
Eosinophils Relative: 1 %
HCT: 40.8 % (ref 36.0–46.0)
Hemoglobin: 13.4 g/dL (ref 12.0–15.0)
Immature Granulocytes: 1 %
Lymphocytes Relative: 16 %
Lymphs Abs: 1.6 10*3/uL (ref 0.7–4.0)
MCH: 34.1 pg — ABNORMAL HIGH (ref 26.0–34.0)
MCHC: 32.8 g/dL (ref 30.0–36.0)
MCV: 103.8 fL — ABNORMAL HIGH (ref 80.0–100.0)
Monocytes Absolute: 0.8 10*3/uL (ref 0.1–1.0)
Monocytes Relative: 8 %
Neutro Abs: 7.3 10*3/uL (ref 1.7–7.7)
Neutrophils Relative %: 74 %
Platelets: 159 10*3/uL (ref 150–400)
RBC: 3.93 MIL/uL (ref 3.87–5.11)
RDW: 13.3 % (ref 11.5–15.5)
WBC: 9.9 10*3/uL (ref 4.0–10.5)
nRBC: 0 % (ref 0.0–0.2)

## 2021-10-02 LAB — BASIC METABOLIC PANEL
Anion gap: 8 (ref 5–15)
BUN: 22 mg/dL (ref 8–23)
CO2: 29 mmol/L (ref 22–32)
Calcium: 9.3 mg/dL (ref 8.9–10.3)
Chloride: 105 mmol/L (ref 98–111)
Creatinine, Ser: 0.85 mg/dL (ref 0.44–1.00)
GFR, Estimated: >60 mL/min (ref >60)
Glucose, Bld: 113 mg/dL — ABNORMAL HIGH (ref 70–99)
Potassium: 4 mmol/L (ref 3.5–5.1)
Sodium: 142 mmol/L (ref 135–145)

## 2021-10-02 MED ORDER — HYDROCODONE-ACETAMINOPHEN 5-325 MG PO TABS
1.0000 | ORAL_TABLET | Freq: Once | ORAL | Status: AC
  Administered 2021-10-02: 1 via ORAL
  Filled 2021-10-02: qty 1

## 2021-10-02 NOTE — ED Notes (Signed)
Transport called.

## 2021-10-02 NOTE — ED Notes (Signed)
Pt able to ambulate.  

## 2021-10-02 NOTE — ED Triage Notes (Signed)
Patient BIB EMS from home c/o fall. Pt report she got dizzy and fell in the bathroom. Pt report she fell backwards and hit her head in the cabinet. Pt denies on blood thinners. Per report pt unable to bear weight on her left ankle.   CBG 114 BP 150/90 HR 98 RR 16 O2sat 99% on [place] 2L

## 2021-10-02 NOTE — ED Notes (Addendum)
[facility] at bedside to transport pt home.

## 2021-10-02 NOTE — ED Provider Notes (Signed)
[facility] DEPT Provider Note   CSN: [id number] Arrival date & time: 10/02/21  7989     History  Chief Complaint  Patient presents with   Beverly Ballard    Beverly Ballard is a 76 y.o. female.  She has a history of seizure disorders brain tumor.  She has problems with cognition and balance.  Frequent falls.  She said she was getting up to go the bathroom this morning and fell to the ground.  She thinks she hit her head although she denies any loss of consciousness.  Complaining of pain primarily in her knees which is chronic.  She denies any chest pain or shortness of breath.  No neck or back pain.  The history is provided by the patient.  Fall This is a recurrent problem. The current episode started 1 to 2 hours ago. The problem has not changed since onset.Pertinent negatives include no chest pain, no abdominal pain, no headaches and no shortness of breath. The symptoms are aggravated by bending, twisting and standing. Nothing relieves the symptoms. She has tried nothing for the symptoms. The treatment provided no relief.       Home Medications Prior to Admission medications   Medication Sig Start Date End Date Taking? Authorizing Provider  albuterol (VENTOLIN HFA) 108 (90 Base) MCG/ACT inhaler Inhale 1-2 puffs into the lungs every 6 (six) hours as needed for wheezing or shortness of breath. 05/11/20   Volanda Napoleon, PA-C  busPIRone (BUSPAR) 10 MG tablet TAKE 1 TABLET BY MOUTH TWICE A DAY 08/19/21   Hurst, Teresa T, PA-C  cholecalciferol (VITAMIN D3) 25 MCG (1000 UNIT) tablet Take 1,000 Units by mouth every morning.    [provider]  dexamethasone (DECADRON) 4 MG tablet Take 1 tablet (4 mg total) by mouth 2 (two) times daily with a meal. 06/18/21   Vaslow, Acey Lav, MD  EPINEPHrine 0.3 mg/0.3 mL IJ SOAJ injection Inject 0.3 mg into the muscle once as needed for anaphylaxis. 09/14/19   [provider]  esomeprazole (NEXIUM) 20 MG capsule Take 2  capsules (40 mg total) by mouth daily before breakfast. Patient taking differently: Take 20 mg by mouth daily before breakfast. 03/21/19   Laurey Morale, MD  furosemide (LASIX) 20 MG tablet Daily prn leg swelling Patient not taking: Reported on 08/06/2021 05/29/21   Caren Macadam, MD  gabapentin (NEURONTIN) 300 MG capsule Take 1 capsule every night 05/31/21   Cameron Sprang, MD  HYDROcodone-acetaminophen (NORCO) 5-325 MG tablet Take 1 tablet by mouth 3 (three) times daily as needed. 06/14/21   Aundra Dubin, PA-C  levETIRAcetam (KEPPRA) 1000 MG tablet Take 1 and 1/2 tablets twice a day 08/08/21   Ventura Sellers, MD  levothyroxine (SYNTHROID) 88 MCG tablet Take 1 tablet (88 mcg total) by mouth daily. Patient taking differently: Take 88 mcg by mouth daily before breakfast. 10/11/20   Philemon Kingdom, MD  ondansetron (ZOFRAN) 8 MG tablet Take 1 tablet (8 mg total) by mouth 2 (two) times daily as needed (nausea and vomiting). May take 30-60 minutes prior to Temodar administration if nausea/vomiting occurs. 06/26/21   Ventura Sellers, MD  pravastatin (PRAVACHOL) 20 MG tablet Take 1 tablet (20 mg total) by mouth at bedtime. 04/20/21   Caren Macadam, MD  prochlorperazine (COMPAZINE) 10 MG tablet Take 1 tablet (10 mg total) by mouth every 6 (six) hours as needed for nausea or vomiting. 05/13/21   Vaslow, Acey Lav, MD  sertraline (ZOLOFT)  100 MG tablet TAKE 2 TABLETS BY MOUTH EVERY DAY 03/29/21   Donnal Moat T, PA-C  solifenacin (VESICARE) 10 MG tablet Take 1 tablet (10 mg total) by mouth at bedtime. 08/14/21   Caren Macadam, MD  Spacer/Aero-Holding Chambers (AEROCHAMBER PLUS) inhaler Use as instructed 11/16/20   Caren Macadam, MD  temozolomide (TEMODAR) 140 MG capsule Take 1 capsule (140 mg total) by mouth daily. (Take with 180 mg capsule (total daily dose is 320 mg). May take on an empty stomach to decrease nausea & vomiting. Take for 5 days, then none for 23 days. Repeat every 28  days. Patient not taking: Reported on 08/06/2021 06/26/21   Ventura Sellers, MD  temozolomide (TEMODAR) 180 MG capsule Take 1 capsule (180 mg total) by mouth daily. (Take with 140 mg capsule (total daily dose is 320 mg). May take on an empty stomach to decrease nausea & vomiting. Take for 5 days, then none for 23 days. Repeat every 28 days. Patient not taking: Reported on 08/06/2021 06/26/21   Ventura Sellers, MD  vitamin B-12 (CYANOCOBALAMIN) 1000 MCG tablet Take 1,000 mcg by mouth every morning.    [provider]  zonisamide (ZONEGRAN) 100 MG capsule Take 2 capsules twice a day 05/31/21   Cameron Sprang, MD      Allergies    Lamotrigine, Bupropion, Carbamazepine, Levetiracetam, Tramadol, Adhesive [tape], Amitriptyline, Clarithromycin, Doxycycline, Nickel, Other, Topamax [topiramate], Estradiol, Latex, and Phenytoin sodium extended    Review of Systems   Review of Systems  Constitutional:  Negative for fever.  HENT:  Negative for sore throat.   Respiratory:  Negative for shortness of breath.   Cardiovascular:  Negative for chest pain.  Gastrointestinal:  Negative for abdominal pain.  Genitourinary:  Negative for dysuria.  Musculoskeletal:  Negative for back pain.  Neurological:  Negative for headaches.    Physical Exam Updated Vital Signs BP (!) 146/73 (BP Location: Left Arm)   Pulse 63   Temp 98.5 F (36.9 C) (Oral)   Resp 16   Ht '5\' 5"'$  (1.651 m)   Wt 104.3 kg   SpO2 98%   BMI 38.27 kg/m  Physical Exam Vitals and nursing note reviewed.  Constitutional:      General: She is not in acute distress.    Appearance: Normal appearance. She is well-developed.  HENT:     Head: Normocephalic and atraumatic.  Eyes:     Conjunctiva/sclera: Conjunctivae normal.  Cardiovascular:     Rate and Rhythm: Normal rate and regular rhythm.     Heart sounds: No murmur heard. Pulmonary:     Effort: Pulmonary effort is normal. No respiratory distress.     Breath sounds: Normal breath  sounds.  Abdominal:     Palpations: Abdomen is soft.     Tenderness: There is no abdominal tenderness. There is no guarding or rebound.  Musculoskeletal:        General: Tenderness present. No deformity.     Cervical back: Neck supple.     Comments: Full range of motion of upper extremities without any pain or limitation.  She is diffusely tender bilateral lower extremities including hips knees and ankles especially left ankle.  There are some bruising of her left lower leg.  No deformities.  Well-healed surgical scars over bilateral knees.  Distal pulses motor and sensation intact.  Skin:    General: Skin is warm and dry.     Capillary Refill: Capillary refill takes less than 2 seconds.  Neurological:  General: No focal deficit present.     Mental Status: She is alert. Mental status is at baseline. She is disoriented.     Sensory: No sensory deficit.     Motor: No weakness.     Comments: She is awake and alert.  She is slow to answer questions but seems appropriate.     ED Results / Procedures / Treatments   Labs (all labs ordered are listed, but only abnormal results are displayed) Labs Reviewed  BASIC METABOLIC PANEL - Abnormal; Notable for the following components:      Result Value   Glucose, Bld 113 (*)    All other components within normal limits  CBC WITH DIFFERENTIAL/PLATELET - Abnormal; Notable for the following components:   MCV 103.8 (*)    MCH 34.1 (*)    Abs Immature Granulocytes 0.14 (*)    All other components within normal limits  URINALYSIS, ROUTINE W REFLEX MICROSCOPIC - Abnormal; Notable for the following components:   Color, Urine STRAW (*)    All other components within normal limits    EKG None  Radiology DG Ankle Complete Right  Result Date: 10/02/2021 CLINICAL DATA:  Bilateral ankle pain after fall today. EXAM: RIGHT ANKLE - COMPLETE 3+ VIEW COMPARISON:  August 10, 2020. FINDINGS: There is no evidence of fracture, dislocation, or joint effusion.  There is no evidence of arthropathy or other focal bone abnormality. Soft tissues are unremarkable. IMPRESSION: Negative. Electronically Signed   By: Marijo Conception M.D.   On: 10/02/2021 09:17   DG Ankle Complete Left  Result Date: 10/02/2021 CLINICAL DATA:  Bilateral ankle pain after fall today. EXAM: LEFT ANKLE COMPLETE - 3+ VIEW COMPARISON:  None Available. FINDINGS: There is no evidence of fracture, dislocation, or joint effusion. There is no evidence of arthropathy or other focal bone abnormality. Soft tissues are unremarkable. IMPRESSION: Negative. Electronically Signed   By: Marijo Conception M.D.   On: 10/02/2021 09:16   DG Knee Complete 4 Views Left  Result Date: 10/02/2021 CLINICAL DATA:  Bilateral knee pain after fall. EXAM: LEFT KNEE - COMPLETE 4+ VIEW COMPARISON:  None Available. FINDINGS: Status post left total knee arthroplasty with long-stem components. No fracture, dislocation or effusion is noted. IMPRESSION: No acute abnormality seen. Electronically Signed   By: Marijo Conception M.D.   On: 10/02/2021 09:14   DG Knee Complete 4 Views Right  Result Date: 10/02/2021 CLINICAL DATA:  Bilateral knee pain after fall today. EXAM: RIGHT KNEE - COMPLETE 4+ VIEW COMPARISON:  None Available. FINDINGS: No evidence of fracture, dislocation, or joint effusion. Mild narrowing of medial joint space is noted with osteophyte formation. Soft tissues are unremarkable. IMPRESSION: Mild degenerative joint disease is noted medially. No acute abnormality seen. Electronically Signed   By: Marijo Conception M.D.   On: 10/02/2021 09:14   DG Hips Bilat W or Wo Pelvis 3-4 Views  Result Date: 10/02/2021 CLINICAL DATA:  Bilateral hip pain after fall today. EXAM: DG HIP (WITH OR WITHOUT PELVIS) 3-4V BILAT COMPARISON:  None Available. FINDINGS: There is no evidence of hip fracture or dislocation. There is no evidence of arthropathy or other focal bone abnormality. IMPRESSION: Negative. Electronically Signed   By: Marijo Conception M.D.   On: 10/02/2021 09:12   CT Head Wo Contrast  Result Date: 10/02/2021 CLINICAL DATA:  Fall backwards and hit head EXAM: CT HEAD WITHOUT CONTRAST TECHNIQUE: Contiguous axial images were obtained from the base of the skull through the  vertex without intravenous contrast. RADIATION DOSE REDUCTION: This exam was performed according to the departmental dose-optimization program which includes automated exposure control, adjustment of the mA and/or kV according to patient size and/or use of iterative reconstruction technique. COMPARISON:  Brain MRI 06/14/2021, CT head 02/27/2021 FINDINGS: Brain: There is no evidence of acute intracranial hemorrhage, extra-axial fluid collection, or acute infarct. Postsurgical changes reflecting left parietal craniotomy for mass resection are noted. Hypodensity in the underlying brain parenchyma appears decreased compared to the brain MRI from 06/14/2021, allowing for differences in modality, but is suboptimally assessed on this study. The ventricles are stable in size. Gray-white differentiation is otherwise preserved. There is no mass effect or midline shift. Vascular: There is calcification of the bilateral cavernous ICAs. Skull: Postsurgical changes are noted reflecting left parietal craniotomy as above. There is no acute calvarial fracture. Sinuses/Orbits: The imaged paranasal sinuses are clear. The globes and orbits are unremarkable. Other: None. IMPRESSION: 1. No acute intracranial pathology. 2. Postsurgical changes reflecting left parietal craniotomy for mass resection. Hypodensity in the underlying brain parenchyma appears decreased compared to the brain MRI from 06/14/2021 allowing for differences in modality but is suboptimally assessed on this study. Electronically Signed   By: Valetta Mole M.D.   On: 10/02/2021 08:57    Procedures Procedures    Medications Ordered in ED Medications  HYDROcodone-acetaminophen (NORCO/VICODIN) 5-325 MG per tablet 1  tablet (has no administration in time range)    ED Course/ Medical Decision Making/ A&P Clinical Course as of 10/02/21 1634  Wed Oct 02, 2021  0926 Patient's plain film imaging negative for acute fracture.  CT head showed stable findings of postoperative changes. [MB]  3149 Patient's husband here now.  Updated him on results of imaging.  Anticipate discharge soon.  I did reach out to hospice and let them know she is here. [MB]  7026 Patient seen by the hospice rep.  She is asking for the patient to be transferred back by [facility] to her house. [MB]    Clinical Course User Index [MB] Hayden Rasmussen, MD                           Medical Decision Making Amount and/or Complexity of Data Reviewed Labs: ordered. Radiology: ordered.  Risk Prescription drug management.   This patient complains of pain in her hips knees and ankles after a fall, unsteady gait; this involves an extensive number of treatment Options and is a complaint that carries with it a high risk of complications and morbidity. The differential includes fracture, dislocation, sprain, contusion, intracranial bleed  I ordered, reviewed and interpreted labs, which included CBC with normal white count normal hemoglobin, chemistries normal other than mildly elevated glucose, urinalysis without signs of infection I ordered medication oral pain medication and reviewed PMP when indicated. I ordered imaging studies which included CT head, x-rays of hips knees ankles bilateral and I independently    visualized and interpreted imaging which showed no acute findings Additional history obtained from patient's husband Previous records obtained and reviewed in epic including recent oncology notes Cardiac monitoring reviewed, normal sinus rhythm Social determinants considered, patient with significant stressors Critical Interventions: None  After the interventions stated above, I reevaluated the patient and found patient  to be neuro intact and at baseline Admission and further testing considered, no indications for admission at this time.  She was seen by the hospice coordinator and arrangements made to return her back  to home with assistance.  Patient and husband in agreement with plan         Final Clinical Impression(s) / ED Diagnoses Final diagnoses:  Frequent falls  Acute pain of both knees  Acute bilateral ankle pain    Rx / DC Orders ED Discharge Orders     None         Hayden Rasmussen, MD 10/02/21 1636

## 2021-10-02 NOTE — Discharge Instructions (Signed)
You were seen in the emergency department for evaluation of injuries from a fall.  You had a CAT scan of your head along with x-rays of both of your hips knees and ankles that did not show any acute findings.  Please continue your regular medications and follow up with your treatment team.  Return to the emergency department if any worsening or concerning symptoms.

## 2021-10-05 ENCOUNTER — Other Ambulatory Visit: Payer: Self-pay | Admitting: Physician Assistant

## 2021-10-08 NOTE — Telephone Encounter (Signed)
Last seen 01/2021 with RTC in 2 weeks.  Please call to schedule an appt.

## 2021-10-09 NOTE — Telephone Encounter (Signed)
Please call pt and check on her. Is she on chemo or anything? It's ok to give her a 1 month supply, but she needs to be seen for anymore.  telehealth is fine with me, if insurance pays. I understand her not wanting to come in if she's on chemo or radiation. Thanks.

## 2021-10-10 ENCOUNTER — Ambulatory Visit: Payer: Medicare HMO | Admitting: Internal Medicine

## 2021-10-10 ENCOUNTER — Other Ambulatory Visit: Payer: Self-pay | Admitting: Physician Assistant

## 2021-10-10 MED ORDER — BUSPIRONE HCL 10 MG PO TABS: 10.0000 mg | ORAL_TABLET | Freq: Two times a day (BID) | ORAL | 0 refills | Status: DC

## 2021-10-10 MED ORDER — SERTRALINE HCL 100 MG PO TABS
200.0000 mg | ORAL_TABLET | Freq: Every day | ORAL | 0 refills | Status: DC
Start: 2021-10-10 — End: 2024-01-11

## 2021-10-10 NOTE — Telephone Encounter (Signed)
Just FYI - Patient is currently under hospice care.

## 2021-10-10 NOTE — Telephone Encounter (Signed)
I'm sorry to hear this. Under these circumstances, I'll RF both the Buspar and Zoloft, am sending to [facility] on [address]. Please let her, her husband or dtr know these are being sent.  Thanks

## 2021-10-23 ENCOUNTER — Encounter: Payer: Self-pay | Admitting: Internal Medicine

## 2021-11-11 ENCOUNTER — Telehealth: Payer: Self-pay | Admitting: *Deleted

## 2021-11-11 ENCOUNTER — Other Ambulatory Visit: Payer: Self-pay

## 2021-11-11 NOTE — Telephone Encounter (Signed)
[facility] called to report that patient has had a increase in seizures over the past week.  Spouse states patient is reluctant to take the Ativan but when she takes it she does not have any seizures.    Nurse reeducated the benefit of this medication tot he patient and spouse and will continue to do so.  Routing to provider

## 2021-11-19 ENCOUNTER — Other Ambulatory Visit: Payer: Self-pay

## 2021-12-03 ENCOUNTER — Other Ambulatory Visit: Payer: Self-pay | Admitting: Neurology

## 2021-12-21 ENCOUNTER — Other Ambulatory Visit: Payer: Self-pay | Admitting: Internal Medicine

## 2022-01-01 ENCOUNTER — Other Ambulatory Visit: Payer: Medicare HMO

## 2022-01-06 ENCOUNTER — Encounter: Payer: Self-pay | Admitting: Internal Medicine

## 2022-01-16 ENCOUNTER — Telehealth: Payer: Self-pay | Admitting: *Deleted

## 2022-01-16 NOTE — Telephone Encounter (Signed)
Received fax from [employer] of Urinalysis that was ordered by [facility].  Patient was positive for UTI.  They ordered Cipro 500 mg BID X 10 days.  Routed FYI to provider

## 2022-01-17 ENCOUNTER — Encounter: Payer: Self-pay | Admitting: Internal Medicine

## 2022-01-24 ENCOUNTER — Other Ambulatory Visit: Payer: Self-pay | Admitting: *Deleted

## 2022-01-24 MED ORDER — PRAVASTATIN SODIUM 20 MG PO TABS: 20.0000 mg | ORAL_TABLET | Freq: Every day | ORAL | 0 refills | Status: DC

## 2022-01-24 NOTE — Telephone Encounter (Signed)
Ok to fill, needs a TOC appt with me.

## 2022-02-04 ENCOUNTER — Telehealth: Payer: Self-pay

## 2022-02-04 ENCOUNTER — Ambulatory Visit: Payer: Medicare HMO

## 2022-02-04 NOTE — Telephone Encounter (Signed)
Unsuccessful attempt to reach patient on preferred number listed in notes for scheduled AWV. Left message on voicemail okay to reschedule. 

## 2022-02-13 ENCOUNTER — Other Ambulatory Visit: Payer: Self-pay | Admitting: Family Medicine

## 2022-02-18 ENCOUNTER — Telehealth: Payer: Self-pay | Admitting: *Deleted

## 2022-02-18 NOTE — Patient Outreach (Signed)
  Care Coordination   02/18/2022 Name: Beverly Ballard MRN: [id number] DOB: Jun 12, 1945   Care Coordination Outreach Attempts:  An unsuccessful telephone outreach was attempted today to offer the patient information about available care coordination services as a benefit of their health plan.   Follow Up Plan:  Additional outreach attempts will be made to offer the patient care coordination information and services.   Encounter Outcome:  No Answer  Care Coordination Interventions Activated:  No   Care Coordination Interventions:  No, not indicated    Raina Mina, RN Care Management Coordinator [place] Office [phone]

## 2022-02-20 ENCOUNTER — Telehealth: Payer: Self-pay | Admitting: Family Medicine

## 2022-02-20 NOTE — Telephone Encounter (Signed)
Pt's spouse called to say Pt was not a No Show for her appt on 02/04/22.  Spouse stated he cancelled this appt a long time ago, because wife is non-verbal due to her condition, and has been for a very long time.

## 2022-03-13 ENCOUNTER — Other Ambulatory Visit: Payer: Self-pay | Admitting: Family Medicine

## 2022-03-29 IMAGING — CT CT ANGIO CHEST
2 of 8 series · 18 of 46 positions shown · IV contrast (OMNIPAQUE 350)
Comparison: High-resolution chest CT today reported separately.

CTA chest 12/15/2010.

CLINICAL DATA: 73-year-old female with progressive shortness of
breath over the last few months. History of multiple pneumonias.

EXAM:
CT ANGIOGRAPHY CHEST WITH CONTRAST
TECHNIQUE: Multidetector CT imaging of the chest was performed using the
standard protocol during bolus administration of intravenous
contrast. Multiplanar CT image reconstructions and MIPs were
obtained to evaluate the vascular anatomy.
CONTRAST:  80mL OMNIPAQUE IOHEXOL 350 MG/ML SOLN

[Series 5: thins · axial · 0.66mm/px · z∈[-225,-6]mm · 15 of 247 slices shown]
[im 14/247  lung]
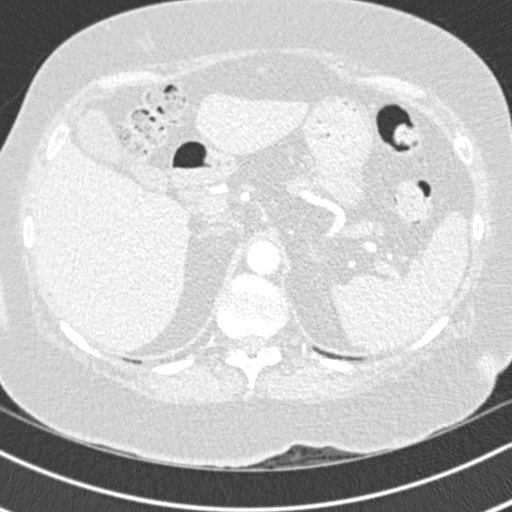
[im 28/247  soft-tissue]
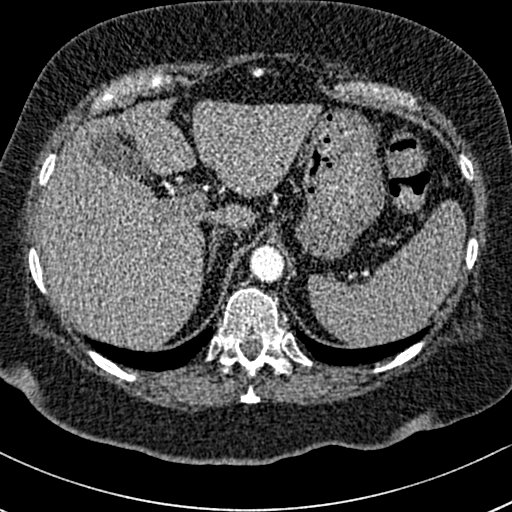
[im 42/247  lung]
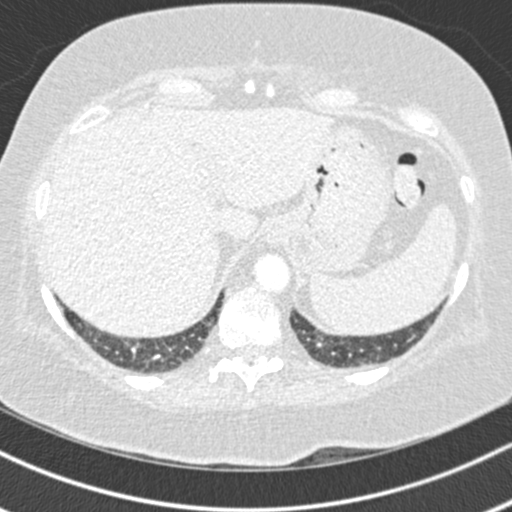
[im 55/247  soft-tissue]
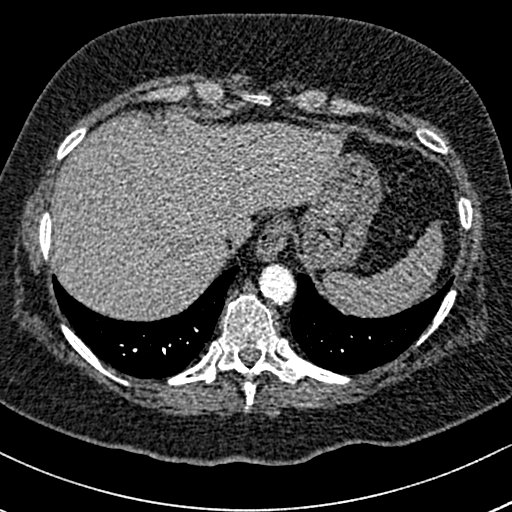
[im 83/247  lung]
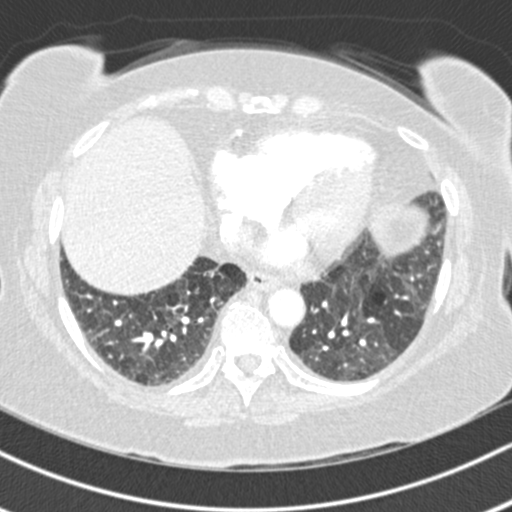
[im 96/247  soft-tissue]
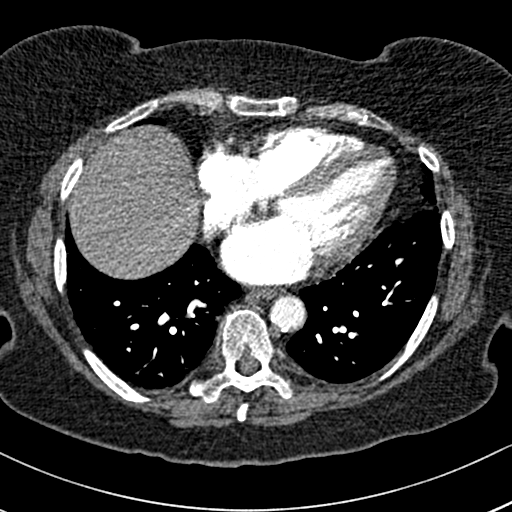
[im 110/247  lung]
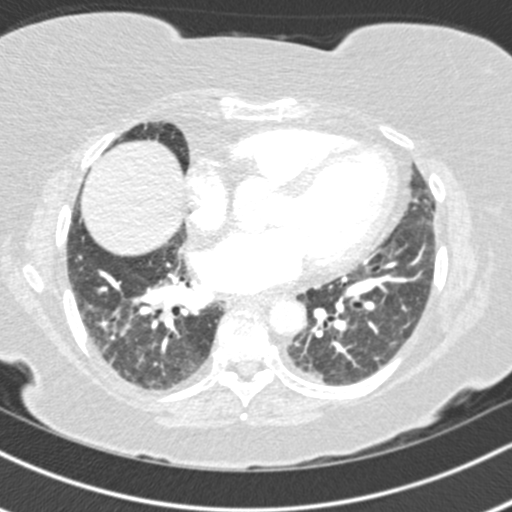
[im 124/247  soft-tissue]
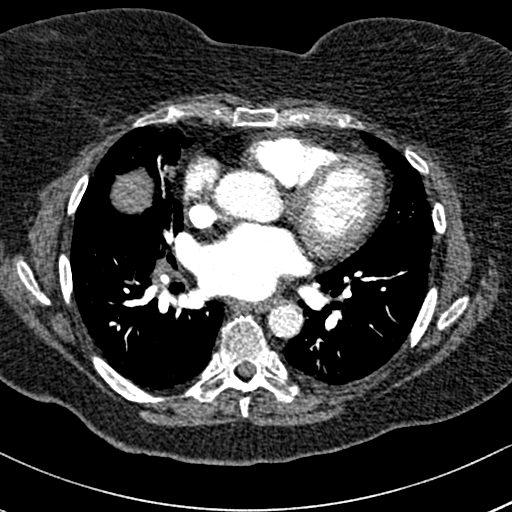
[im 137/247  lung]
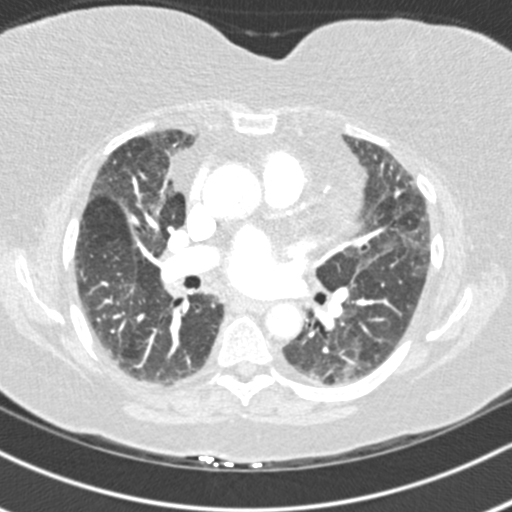
[im 151/247  soft-tissue]
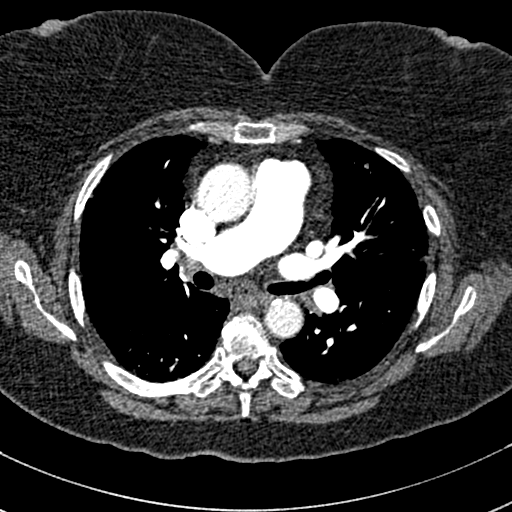
[im 165/247  lung]
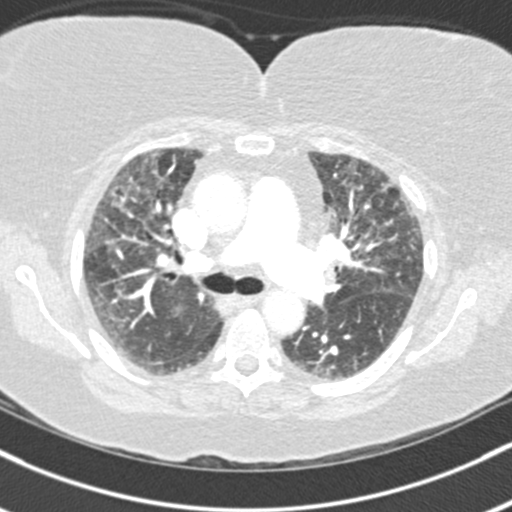
[im 192/247  soft-tissue]
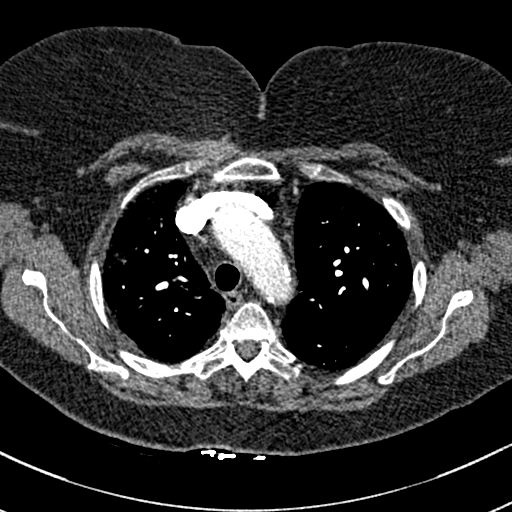
[im 206/247  lung]
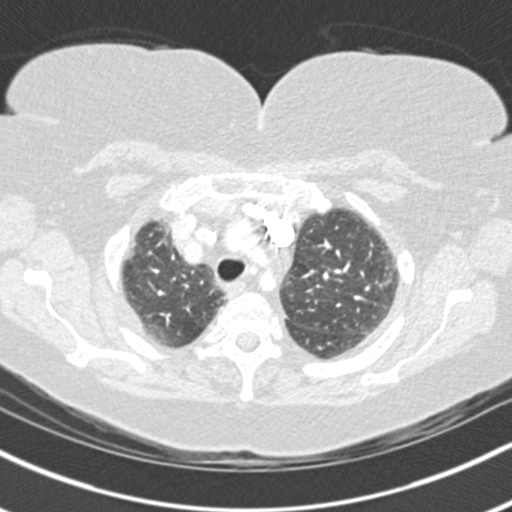
[im 219/247  soft-tissue]
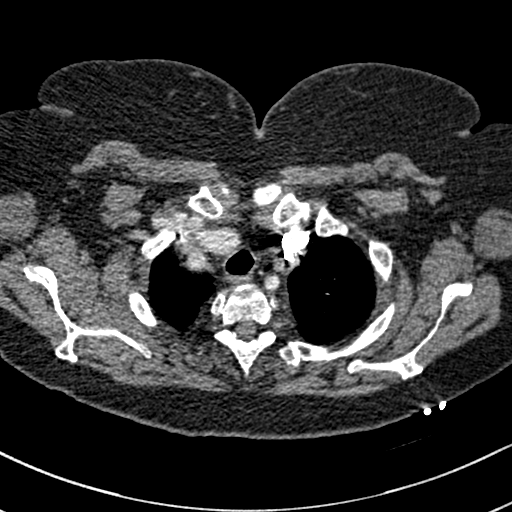
[im 233/247  lung]
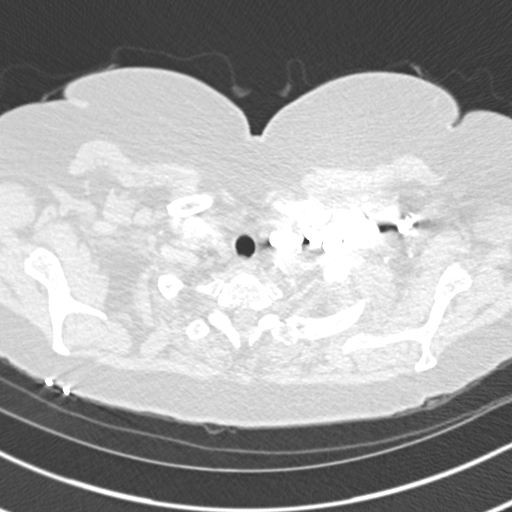

[Series 7: coronal mpr · coronal · 0.49mm/px · 3 of 118 slices shown]
[im 30/118  soft-tissue]
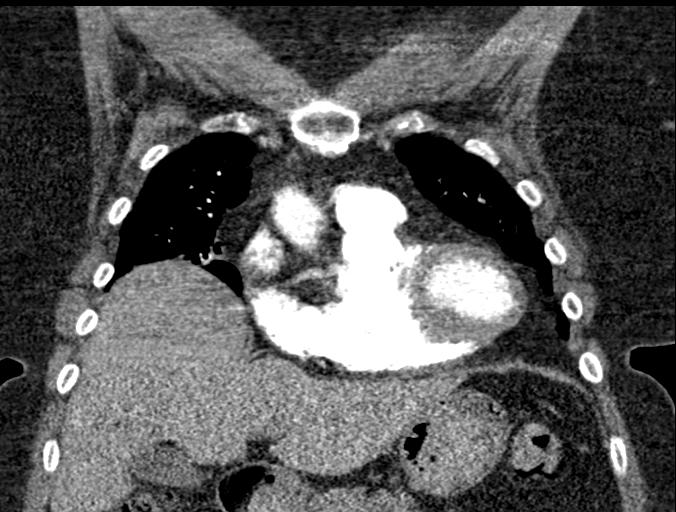
[im 59/118  soft-tissue]
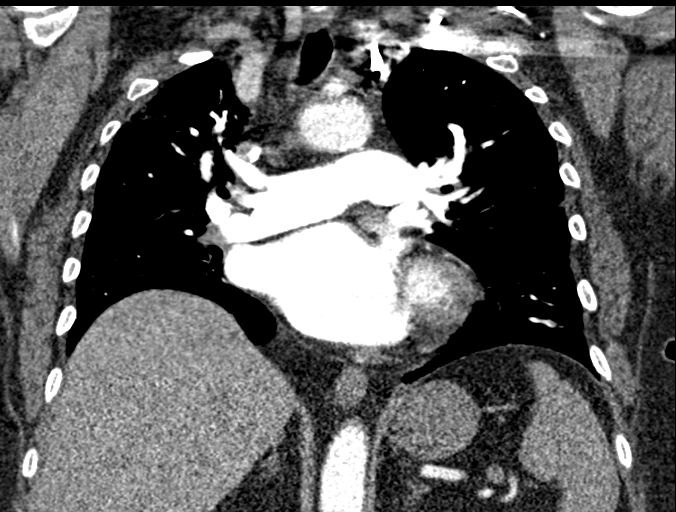
[im 88/118  soft-tissue]
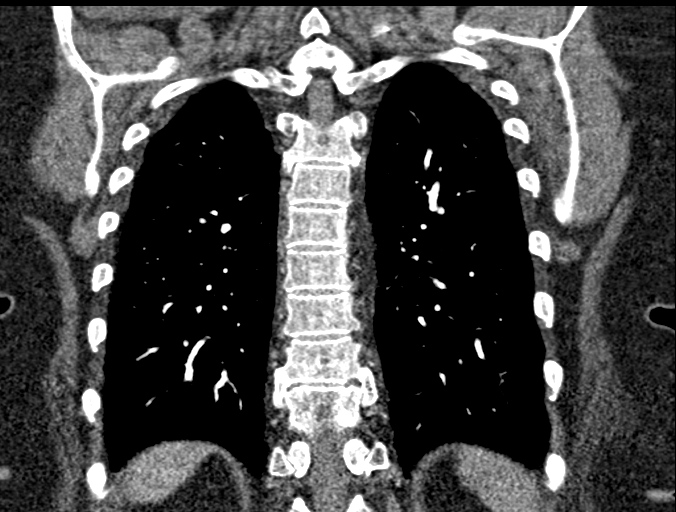

[18 of 46 positions shown; findings below may reference images not displayed]

FINDINGS: Cardiovascular: Good contrast bolus timing in the pulmonary arterial
tree. A degree of central pulmonary artery enlargement has not
significantly changed since [DATE] (series 5, image 91).

No focal filling defect identified in the pulmonary arteries to
suggest acute pulmonary embolism.

Calcified aortic atherosclerosis. Incidental 4 vessel arch
configuration, the left vertebral arises directly from the arch.
Calcified coronary artery atherosclerosis (series 5, image 115).
Cardiac size at the upper limits of normal. No pericardial effusion.

Mediastinum/Nodes: Negative. No lymphadenopathy.

Lungs/Pleura: Major airways are patent. Lower lung volumes compared
to [DATE]. No pleural effusion. No consolidation.

Subpleural and upper lobe predominant pulmonary interstitial
changes, see high-resolution CT today reported separately.

Upper Abdomen: Negative visible liver, gallbladder, spleen,
pancreas, adrenal glands and bowel in the upper abdomen.

Musculoskeletal: No acute osseous abnormality identified.

Review of the MIP images confirms the above findings.
IMPRESSION: 1. Negative for acute pulmonary embolus.
2. Parenchymal lung changes, please refer to High-resolution Chest
CT today which will be reported separately.
3. Calcified coronary artery and Aortic Atherosclerosis
([id number]).

## 2022-03-29 IMAGING — CT CT CHEST HIGH RESOLUTION W/O CM
2 of 8 series · 14 of 36 positions shown, 17 images · non-contrast
Comparison: Chest CT 12/15/2010.

CLINICAL DATA: 73-year-old female with history of shortness of
breath progressively worsening over the past few months.

EXAM:
CT CHEST WITHOUT CONTRAST
TECHNIQUE: Multidetector CT imaging of the chest was performed following the
standard protocol without intravenous contrast. High resolution
imaging of the lungs, as well as inspiratory and expiratory imaging,
was performed.

[Series 6: high resolution · axial · 0.63mm/px · z∈[-265,-38]mm · 11 of 273 slices shown, 14 images]
[im 23/273  mediastinal]
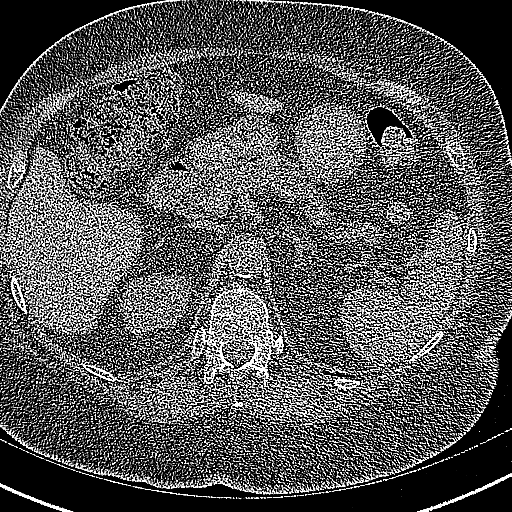
[im 23/273  lung]
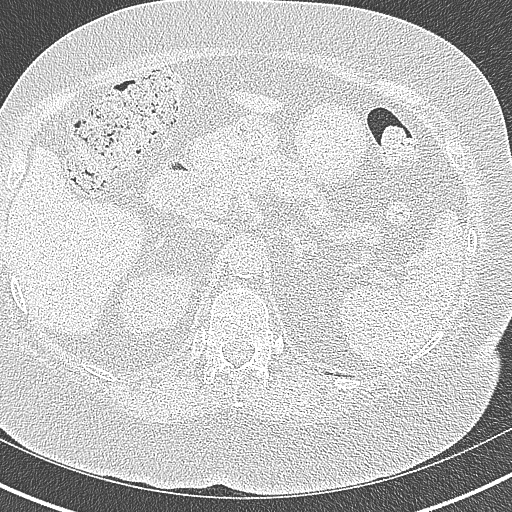
[im 46/273  lung]
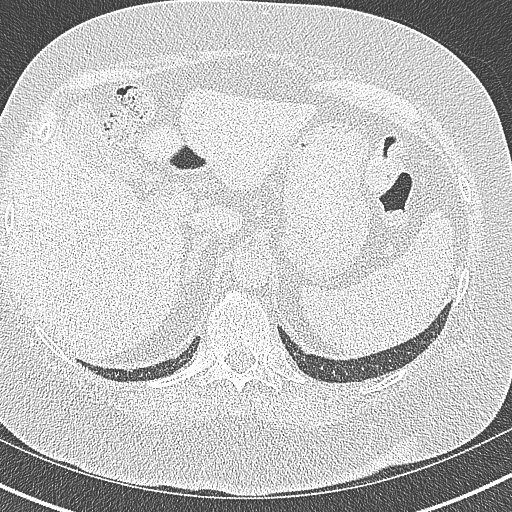
[im 69/273  lung]
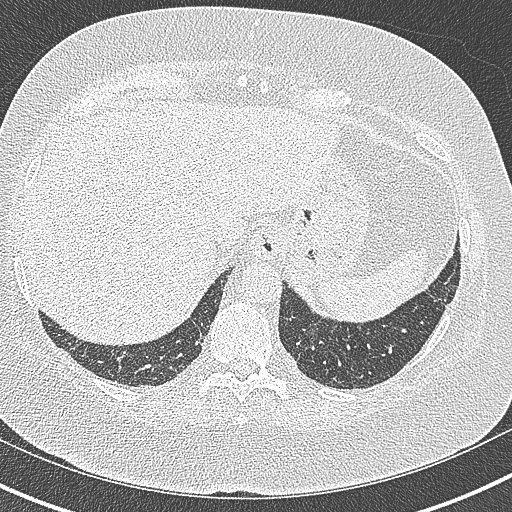
[im 91/273  lung]
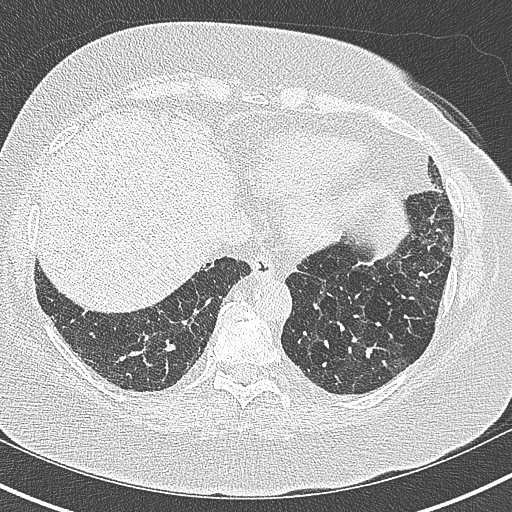
[im 114/273  mediastinal]
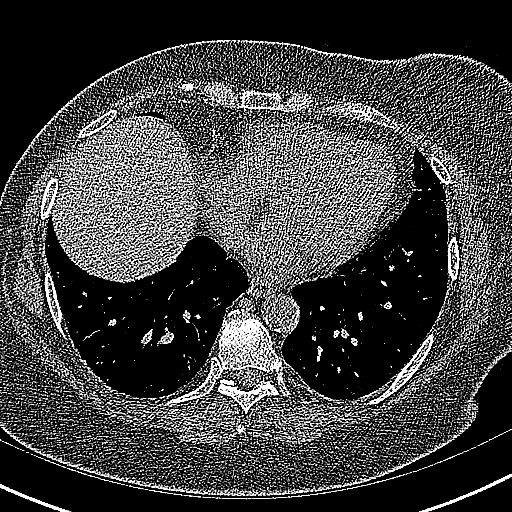
[im 114/273  lung]
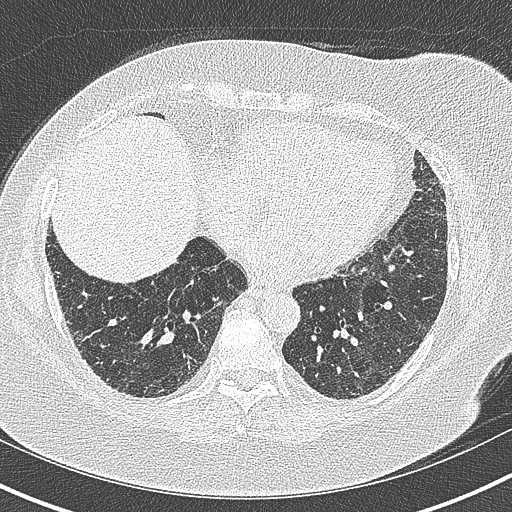
[im 137/273  lung]
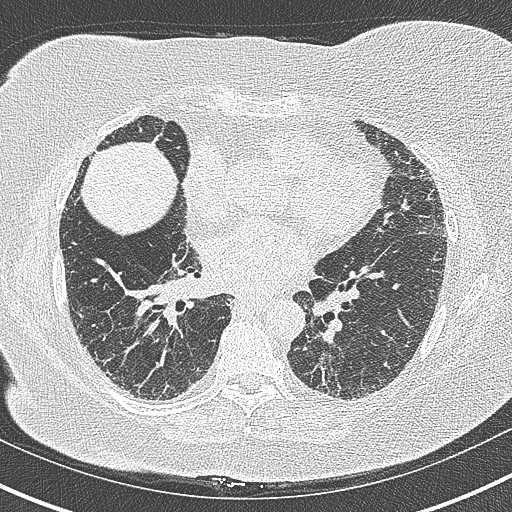
[im 159/273  lung]
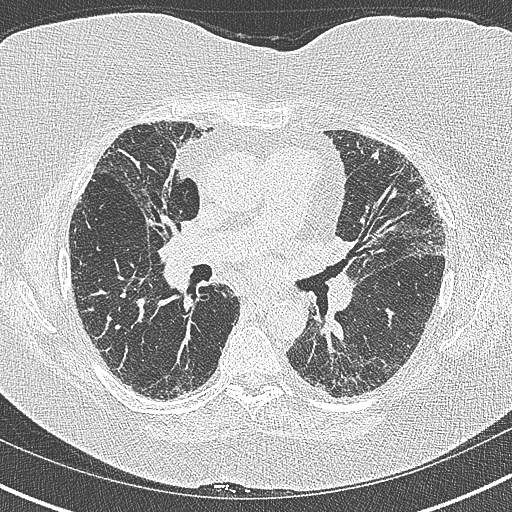
[im 182/273  lung]
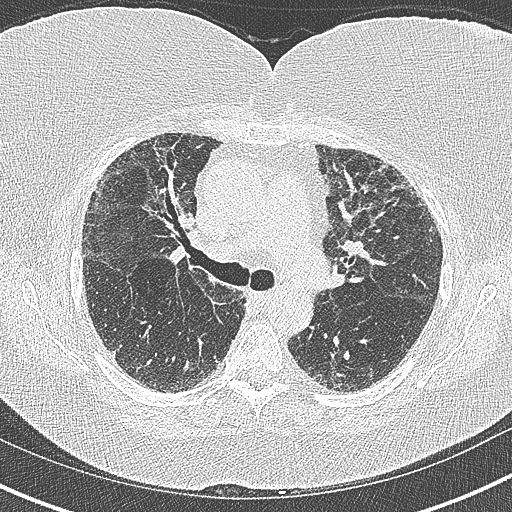
[im 205/273  mediastinal]
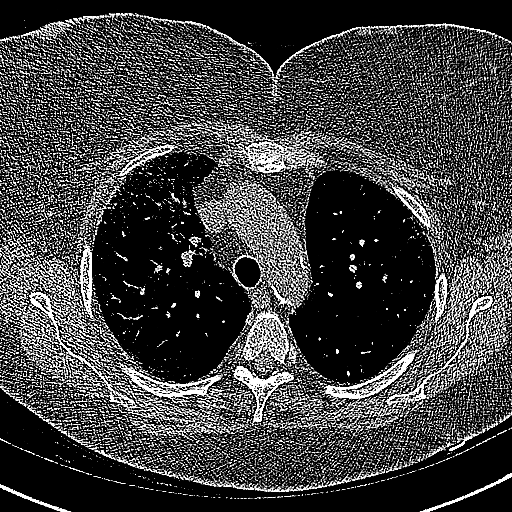
[im 205/273  lung]
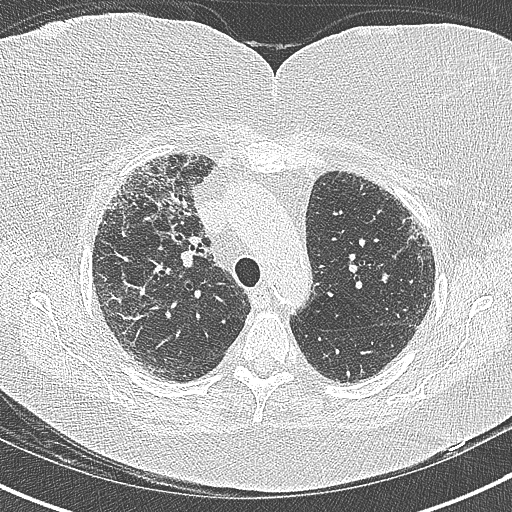
[im 227/273  lung]
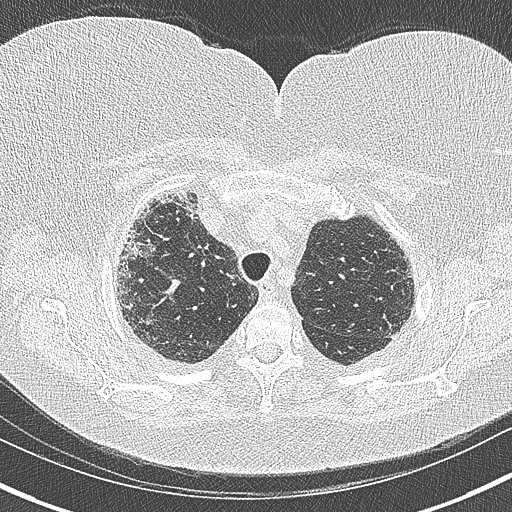
[im 250/273  lung]
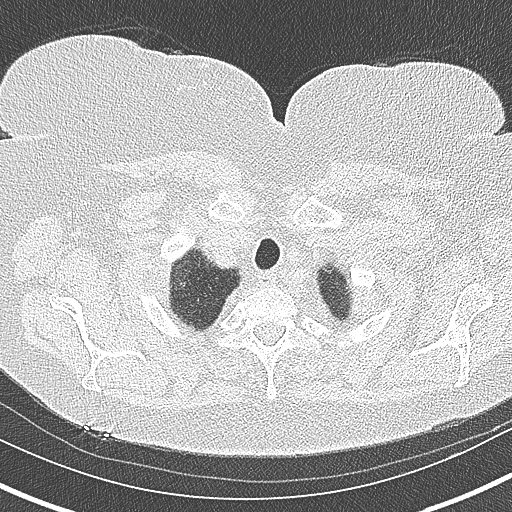

[Series 8: coronal · coronal · 0.55mm/px · 3 of 136 slices shown]
[im 28/136  lung]
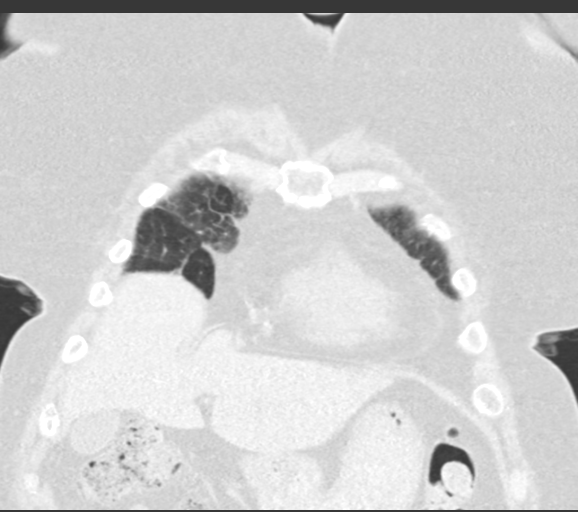
[im 55/136  lung]
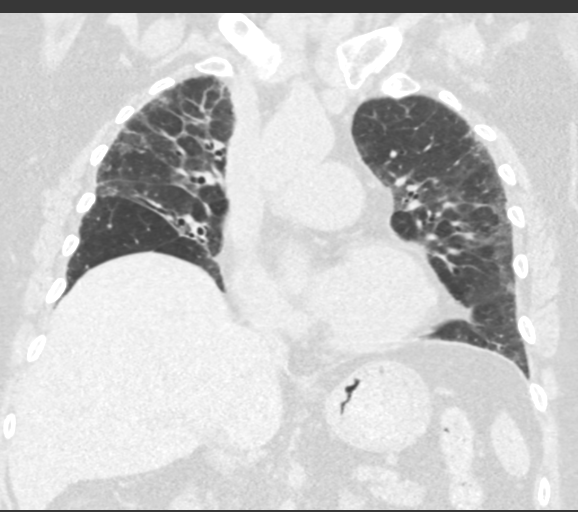
[im 82/136  lung]
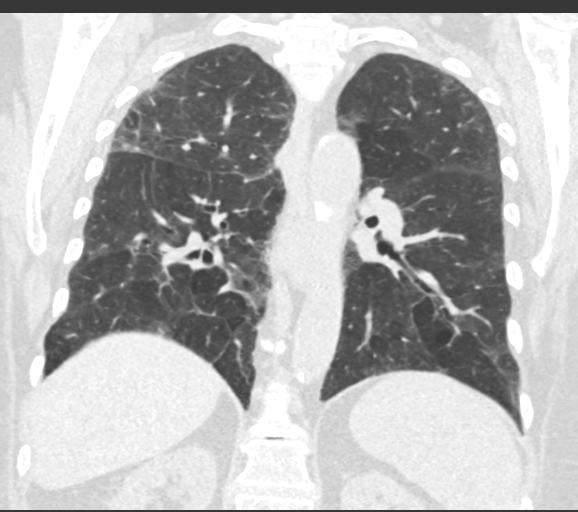

[14 of 36 positions shown; findings below may reference images not displayed]

FINDINGS: Cardiovascular: Heart size is normal. There is no significant
pericardial fluid, thickening or pericardial calcification. There is
aortic atherosclerosis, as well as atherosclerosis of the great
vessels of the mediastinum and the coronary arteries, including
calcified atherosclerotic plaque in the left anterior descending,
left circumflex and right coronary arteries. Dilatation of the
pulmonic trunk (3.8 cm in diameter), concerning for pulmonary
arterial hypertension.

Mediastinum/Nodes: No pathologically enlarged mediastinal or hilar
lymph nodes. Please note that accurate exclusion of hilar adenopathy
is limited on noncontrast CT scans. Esophagus is unremarkable in
appearance. No axillary lymphadenopathy.

Lungs/Pleura: High-resolution images demonstrate patchy areas of
peripheral predominant ground-glass attenuation, septal thickening
and scattered areas of thickening of the peribronchovascular
interstitium with some regional architectural distortion and some
peripheral bronchiolectasis. Findings have no definitive
craniocaudal gradient, and the areas of greatest involvement in
terms of bronchiectasis and thickening of the peribronchovascular
interstitium are upper lung. No honeycombing. Inspiratory and
expiratory imaging demonstrates mild to moderate air trapping
indicative of small airways disease.

Upper Abdomen: Aortic atherosclerosis.

Musculoskeletal: There are no aggressive appearing lytic or blastic
lesions noted in the visualized portions of the skeleton.
IMPRESSION: 1. The appearance of the lungs is compatible with interstitial lung
disease, with a spectrum of findings considered most compatible with
an alternative diagnosis to usual interstitial pneumonia (UIP) per
current ATS guidelines. Based on the spectrum of findings this is
favored to reflect chronic hypersensitivity pneumonitis.
2. Dilatation of the pulmonic trunk (3.8 cm in diameter), concerning
for associated pulmonary arterial hypertension.
3. Aortic atherosclerosis, in addition to 3 vessel coronary artery
disease. Assessment for potential risk factor modification, dietary
therapy or pharmacologic therapy may be warranted, if clinically
indicated.

Aortic Atherosclerosis ([id number]-TSA.A).

## 2022-04-07 ENCOUNTER — Encounter: Payer: Self-pay | Admitting: Internal Medicine

## 2022-04-10 ENCOUNTER — Other Ambulatory Visit: Payer: Self-pay | Admitting: Family Medicine

## 2022-06-18 ENCOUNTER — Other Ambulatory Visit: Payer: Self-pay | Admitting: Internal Medicine

## 2022-06-18 DIAGNOSIS — E890 Postprocedural hypothyroidism: Secondary | ICD-10-CM

## 2022-06-22 ENCOUNTER — Other Ambulatory Visit: Payer: Self-pay | Admitting: Neurology

## 2022-06-24 MED ORDER — LEVOTHYROXINE SODIUM 88 MCG PO TABS: 88.0000 ug | ORAL_TABLET | Freq: Every day | ORAL | 1 refills | Status: DC

## 2022-07-04 ENCOUNTER — Ambulatory Visit: Payer: Medicare HMO | Admitting: Internal Medicine

## 2023-02-20 DEATH — deceased
